# Patient Record
Sex: Female | Born: 1945
Health system: Southern US, Community
[De-identification: ages and names within clinical notes are randomized; demographics above are authoritative.]

## PROBLEM LIST (undated history)

## (undated) DIAGNOSIS — J4530 Mild persistent asthma, uncomplicated: Secondary | ICD-10-CM

## (undated) DIAGNOSIS — R001 Bradycardia, unspecified: Secondary | ICD-10-CM

## (undated) DIAGNOSIS — G4733 Obstructive sleep apnea (adult) (pediatric): Secondary | ICD-10-CM

## (undated) DIAGNOSIS — K219 Gastro-esophageal reflux disease without esophagitis: Secondary | ICD-10-CM

## (undated) DIAGNOSIS — R51 Headache: Secondary | ICD-10-CM

## (undated) DIAGNOSIS — J3089 Other allergic rhinitis: Secondary | ICD-10-CM

## (undated) DIAGNOSIS — R519 Headache, unspecified: Secondary | ICD-10-CM

## (undated) DIAGNOSIS — L500 Allergic urticaria: Secondary | ICD-10-CM

## (undated) DIAGNOSIS — E559 Vitamin D deficiency, unspecified: Secondary | ICD-10-CM

## (undated) DIAGNOSIS — E785 Hyperlipidemia, unspecified: Secondary | ICD-10-CM

## (undated) DIAGNOSIS — I10 Essential (primary) hypertension: Secondary | ICD-10-CM

## (undated) DIAGNOSIS — Z8619 Personal history of other infectious and parasitic diseases: Secondary | ICD-10-CM

## (undated) DIAGNOSIS — H9190 Unspecified hearing loss, unspecified ear: Secondary | ICD-10-CM

## (undated) DIAGNOSIS — K589 Irritable bowel syndrome without diarrhea: Secondary | ICD-10-CM

## (undated) DIAGNOSIS — R9431 Abnormal electrocardiogram [ECG] [EKG]: Secondary | ICD-10-CM

## (undated) DIAGNOSIS — T7840XA Allergy, unspecified, initial encounter: Secondary | ICD-10-CM

## (undated) DIAGNOSIS — J309 Allergic rhinitis, unspecified: Secondary | ICD-10-CM

## (undated) DIAGNOSIS — I5041 Acute combined systolic (congestive) and diastolic (congestive) heart failure: Secondary | ICD-10-CM

## (undated) DIAGNOSIS — G4730 Sleep apnea, unspecified: Secondary | ICD-10-CM

## (undated) DIAGNOSIS — H9311 Tinnitus, right ear: Secondary | ICD-10-CM

## (undated) DIAGNOSIS — K5730 Diverticulosis of large intestine without perforation or abscess without bleeding: Secondary | ICD-10-CM

## (undated) DIAGNOSIS — M5430 Sciatica, unspecified side: Secondary | ICD-10-CM

## (undated) DIAGNOSIS — J329 Chronic sinusitis, unspecified: Secondary | ICD-10-CM

## (undated) DIAGNOSIS — IMO0002 Reserved for concepts with insufficient information to code with codable children: Secondary | ICD-10-CM

## (undated) DIAGNOSIS — J302 Other seasonal allergic rhinitis: Secondary | ICD-10-CM

## (undated) DIAGNOSIS — Z0000 Encounter for general adult medical examination without abnormal findings: Secondary | ICD-10-CM

## (undated) DIAGNOSIS — R739 Hyperglycemia, unspecified: Secondary | ICD-10-CM

## (undated) DIAGNOSIS — N760 Acute vaginitis: Secondary | ICD-10-CM

## (undated) HISTORY — DX: Tinnitus, right ear: H93.11

## (undated) HISTORY — PX: CARDIAC CATHETERIZATION: SHX172

## (undated) HISTORY — DX: Obstructive sleep apnea (adult) (pediatric): G47.33

## (undated) HISTORY — DX: Sleep apnea, unspecified: G47.30

## (undated) HISTORY — DX: Irritable bowel syndrome without diarrhea: K58.9

## (undated) HISTORY — DX: Other seasonal allergic rhinitis: J30.2

## (undated) HISTORY — DX: Abnormal electrocardiogram (ECG) (EKG): R94.31

## (undated) HISTORY — DX: Allergic urticaria: L50.0

## (undated) HISTORY — DX: Bradycardia, unspecified: R00.1

## (undated) HISTORY — DX: Headache: R51

## (undated) HISTORY — DX: Encounter for general adult medical examination without abnormal findings: Z00.00

## (undated) HISTORY — DX: Vitamin D deficiency, unspecified: E55.9

## (undated) HISTORY — DX: Allergic rhinitis, unspecified: J30.9

## (undated) HISTORY — DX: Reserved for concepts with insufficient information to code with codable children: IMO0002

## (undated) HISTORY — DX: Hypocalcemia: E83.51

## (undated) HISTORY — DX: Essential (primary) hypertension: I10

## (undated) HISTORY — DX: Mild persistent asthma, uncomplicated: J45.30

## (undated) HISTORY — DX: Acute combined systolic (congestive) and diastolic (congestive) heart failure: I50.41

## (undated) HISTORY — DX: Unspecified hearing loss, unspecified ear: H91.90

## (undated) HISTORY — DX: Hyperglycemia, unspecified: R73.9

## (undated) HISTORY — DX: Acute vaginitis: N76.0

## (undated) HISTORY — DX: Other allergic rhinitis: J30.89

## (undated) HISTORY — DX: Chronic sinusitis, unspecified: J32.9

## (undated) HISTORY — PX: UMBILICAL HERNIA REPAIR: SHX196

## (undated) HISTORY — DX: Diverticulosis of large intestine without perforation or abscess without bleeding: K57.30

## (undated) HISTORY — DX: Allergy, unspecified, initial encounter: T78.40XA

## (undated) HISTORY — DX: Gastro-esophageal reflux disease without esophagitis: K21.9

## (undated) HISTORY — DX: Personal history of other infectious and parasitic diseases: Z86.19

## (undated) HISTORY — DX: Headache, unspecified: R51.9

---

## 1971-09-25 HISTORY — PX: BREAST SURGERY: SHX581

## 1984-09-24 HISTORY — PX: ABDOMINAL HYSTERECTOMY: SHX81

## 1999-12-12 ENCOUNTER — Encounter: Admission: RE | Admit: 1999-12-12 | Discharge: 1999-12-12 | Payer: Self-pay | Admitting: Obstetrics & Gynecology

## 1999-12-12 ENCOUNTER — Encounter: Payer: Self-pay | Admitting: Obstetrics & Gynecology

## 2002-01-13 ENCOUNTER — Emergency Department (HOSPITAL_COMMUNITY): Admission: EM | Admit: 2002-01-13 | Discharge: 2002-01-13 | Payer: Self-pay | Admitting: Emergency Medicine

## 2002-09-08 ENCOUNTER — Other Ambulatory Visit: Admission: RE | Admit: 2002-09-08 | Discharge: 2002-09-08 | Payer: Self-pay | Admitting: *Deleted

## 2003-12-06 ENCOUNTER — Emergency Department (HOSPITAL_COMMUNITY): Admission: AD | Admit: 2003-12-06 | Discharge: 2003-12-06 | Payer: Self-pay | Admitting: Family Medicine

## 2004-02-19 ENCOUNTER — Emergency Department (HOSPITAL_COMMUNITY): Admission: EM | Admit: 2004-02-19 | Discharge: 2004-02-19 | Payer: Self-pay | Admitting: Family Medicine

## 2004-05-08 ENCOUNTER — Ambulatory Visit (HOSPITAL_COMMUNITY): Admission: RE | Admit: 2004-05-08 | Discharge: 2004-05-08 | Payer: Self-pay | Admitting: Gastroenterology

## 2004-05-10 ENCOUNTER — Ambulatory Visit (HOSPITAL_COMMUNITY): Admission: RE | Admit: 2004-05-10 | Discharge: 2004-05-10 | Payer: Self-pay | Admitting: Gastroenterology

## 2005-01-22 ENCOUNTER — Encounter: Payer: Self-pay | Admitting: Internal Medicine

## 2005-06-14 ENCOUNTER — Emergency Department (HOSPITAL_COMMUNITY): Admission: EM | Admit: 2005-06-14 | Discharge: 2005-06-14 | Payer: Self-pay | Admitting: Emergency Medicine

## 2006-06-11 ENCOUNTER — Encounter: Payer: Self-pay | Admitting: Internal Medicine

## 2007-10-06 ENCOUNTER — Emergency Department (HOSPITAL_COMMUNITY): Admission: EM | Admit: 2007-10-06 | Discharge: 2007-10-06 | Payer: Self-pay | Admitting: Family Medicine

## 2008-07-19 ENCOUNTER — Encounter: Payer: Self-pay | Admitting: Internal Medicine

## 2008-12-10 ENCOUNTER — Encounter: Payer: Self-pay | Admitting: Internal Medicine

## 2009-08-04 LAB — HM MAMMOGRAPHY: HM Mammogram: NORMAL

## 2009-09-24 HISTORY — PX: COLONOSCOPY: SHX174

## 2009-10-21 ENCOUNTER — Ambulatory Visit (HOSPITAL_COMMUNITY): Admission: RE | Admit: 2009-10-21 | Discharge: 2009-10-21 | Payer: Self-pay | Admitting: Gastroenterology

## 2009-12-14 ENCOUNTER — Emergency Department (HOSPITAL_COMMUNITY): Admission: EM | Admit: 2009-12-14 | Discharge: 2009-12-14 | Payer: Self-pay | Admitting: Family Medicine

## 2009-12-14 ENCOUNTER — Inpatient Hospital Stay (HOSPITAL_COMMUNITY): Admission: EM | Admit: 2009-12-14 | Discharge: 2009-12-17 | Payer: Self-pay | Admitting: Emergency Medicine

## 2009-12-14 ENCOUNTER — Ambulatory Visit: Payer: Self-pay | Admitting: Cardiology

## 2009-12-14 ENCOUNTER — Encounter: Payer: Self-pay | Admitting: Internal Medicine

## 2009-12-14 LAB — CONVERTED CEMR LAB
BUN: 16 mg/dL
Chloride: 104 meq/L
Creatinine, Ser: 1 mg/dL
Eosinophils Relative: 3 %
Glucose, Bld: 88 mg/dL
HCT: 45 %
Hemoglobin: 15.3 g/dL
Lymphocytes, automated: 22 %

## 2009-12-15 ENCOUNTER — Encounter: Payer: Self-pay | Admitting: Internal Medicine

## 2009-12-15 LAB — CONVERTED CEMR LAB
Cholesterol: 191 mg/dL
Triglyceride fasting, serum: 56 mg/dL

## 2009-12-16 ENCOUNTER — Encounter (INDEPENDENT_AMBULATORY_CARE_PROVIDER_SITE_OTHER): Payer: Self-pay | Admitting: Internal Medicine

## 2009-12-30 ENCOUNTER — Encounter: Payer: Self-pay | Admitting: Internal Medicine

## 2009-12-30 LAB — CONVERTED CEMR LAB
ALT: 18 units/L
Albumin: 3.9 g/dL
Calcium: 8.8 mg/dL
Cholesterol: 177 mg/dL
Creatinine, Ser: 0.8 mg/dL
Glucose, Bld: 89 mg/dL
LDL Cholesterol: 127 mg/dL
Sodium: 136 meq/L
Total Protein: 6.9 g/dL
Triglyceride fasting, serum: 55 mg/dL

## 2010-01-02 ENCOUNTER — Encounter: Payer: Self-pay | Admitting: Internal Medicine

## 2010-08-25 ENCOUNTER — Encounter: Payer: Self-pay | Admitting: Internal Medicine

## 2010-08-28 ENCOUNTER — Ambulatory Visit: Payer: Self-pay | Admitting: Internal Medicine

## 2010-08-28 DIAGNOSIS — I10 Essential (primary) hypertension: Secondary | ICD-10-CM

## 2010-08-28 DIAGNOSIS — J302 Other seasonal allergic rhinitis: Secondary | ICD-10-CM

## 2010-08-28 DIAGNOSIS — G4733 Obstructive sleep apnea (adult) (pediatric): Secondary | ICD-10-CM | POA: Insufficient documentation

## 2010-08-28 DIAGNOSIS — K5730 Diverticulosis of large intestine without perforation or abscess without bleeding: Secondary | ICD-10-CM | POA: Insufficient documentation

## 2010-08-28 DIAGNOSIS — F32A Depression, unspecified: Secondary | ICD-10-CM | POA: Insufficient documentation

## 2010-08-28 DIAGNOSIS — F329 Major depressive disorder, single episode, unspecified: Secondary | ICD-10-CM

## 2010-08-28 DIAGNOSIS — J3089 Other allergic rhinitis: Secondary | ICD-10-CM

## 2010-08-28 HISTORY — DX: Obstructive sleep apnea (adult) (pediatric): G47.33

## 2010-08-28 HISTORY — DX: Other seasonal allergic rhinitis: J30.2

## 2010-08-28 HISTORY — DX: Essential (primary) hypertension: I10

## 2010-08-28 HISTORY — DX: Other allergic rhinitis: J30.89

## 2010-08-29 LAB — CONVERTED CEMR LAB
ALT: 16 units/L (ref 0–35)
AST: 20 units/L (ref 0–37)
Albumin: 4 g/dL (ref 3.5–5.2)
Alkaline Phosphatase: 72 units/L (ref 39–117)
Basophils Relative: 1 % (ref 0.0–3.0)
CO2: 31 meq/L (ref 19–32)
Chloride: 98 meq/L (ref 96–112)
Creatinine, Ser: 0.9 mg/dL (ref 0.4–1.2)
Eosinophils Relative: 5.5 % — ABNORMAL HIGH (ref 0.0–5.0)
Folate: 5 ng/mL
Glucose, Bld: 94 mg/dL (ref 70–99)
Lymphocytes Relative: 20.3 % (ref 12.0–46.0)
MCHC: 34.5 g/dL (ref 30.0–36.0)
MCV: 90.6 fL (ref 78.0–100.0)
Monocytes Relative: 10.6 % (ref 3.0–12.0)
Neutrophils Relative %: 62.6 % (ref 43.0–77.0)
Platelets: 242 10*3/uL (ref 150.0–400.0)
Potassium: 3.9 meq/L (ref 3.5–5.1)
RDW: 13.7 % (ref 11.5–14.6)
Sodium: 137 meq/L (ref 135–145)
TSH: 1.8 microintl units/mL (ref 0.35–5.50)
Total Protein: 7.3 g/dL (ref 6.0–8.3)
Vitamin B-12: 514 pg/mL (ref 211–911)

## 2010-08-31 ENCOUNTER — Encounter: Payer: Self-pay | Admitting: Internal Medicine

## 2010-10-06 ENCOUNTER — Ambulatory Visit
Admission: RE | Admit: 2010-10-06 | Discharge: 2010-10-06 | Payer: Self-pay | Source: Home / Self Care | Attending: Internal Medicine | Admitting: Internal Medicine

## 2010-10-26 NOTE — Letter (Signed)
Summary: [facility] ([facility]  [facility]   Imported By: Sherian Rein 09/29/2010 14:59:45  _____________________________________________________________________  External Attachment:    Type:   Image     Comment:   External Document

## 2010-10-26 NOTE — Assessment & Plan Note (Signed)
Summary: ?reaction to bp meds/cd   Vital Signs:  Patient profile:   65 year old female Height:      60 inches (152.40 cm) Weight:      217.0 pounds (98.64 kg) O2 Sat:      96 % on Room air Temp:     98.3 degrees F (36.83 degrees C) oral Pulse rate:   81 / minute BP sitting:   148 / 78  (left arm) Cuff size:   large  Vitals Entered By: Orlan Leavens RMA (October 06, 2010 3:05 PM)  O2 Flow:  Room air CC: ? reaction to BP med Is Patient Diabetic? No Pain Assessment Patient in pain? no        Primary Care Provider:  Newt Lukes MD  CC:  ? reaction to BP med.  History of Present Illness: here due to conern about BP med -  ?adv rxn -hives, itch and mild lip or tounge swelling after med use no symptoms when med is not taken symptoms mild and present for years but seem worse in past 3 weeks than before no other diet or med changes  reviewed other med issues: 1) HTN - see above -   2) allg rhinitis - reports compliance with otc medical treatment as needed and no rx'd changes in medication dose or frequency. denies adverse side effects related to current therapy. prev on rx meds but declines nose spray use ("it doen't bother me that much")  3) OSA - reports compliance with ongoing medical treatment (CPAP qhs); no changes in mask, pressure or settings. denies adverse side effects related to current tx and no f/u with pulm since starting cpap -   Clinical Review Panels:  CBC   WBC:  9.4 (08/28/2010)   RBC:  4.54 (08/28/2010)   Hgb:  14.2 (08/28/2010)   Hct:  41.1 (08/28/2010)   Platelets:  242.0 (08/28/2010)   MCV  90.6 (08/28/2010)   MCHC  34.5 (08/28/2010)   RDW  13.7 (08/28/2010)   PMN:  62.6 (08/28/2010)   Lymphs:  20.3 (08/28/2010)   Monos:  10.6 (08/28/2010)   Eosinophils:  5.5 (08/28/2010)   Basophil:  1.0 (08/28/2010)  Complete Metabolic Panel   Glucose:  94 (08/28/2010)   Sodium:  137 (08/28/2010)   Potassium:  3.9 (08/28/2010)   Chloride:  98  (08/28/2010)   CO2:  31 (08/28/2010)   BUN:  17 (08/28/2010)   Creatinine:  0.9 (08/28/2010)   Albumin:  4.0 (08/28/2010)   Total Protein:  7.3 (08/28/2010)   Calcium:  9.1 (08/28/2010)   Total Bili:  0.7 (08/28/2010)   Alk Phos:  72 (08/28/2010)   SGPT (ALT):  16 (08/28/2010)   SGOT (AST):  20 (08/28/2010)   Current Medications (verified): 1)  Lisinopril-Hydrochlorothiazide 20-25 Mg Tabs (Lisinopril-Hydrochlorothiazide) .... Take 1 By Mouth Once Daily 2)  Proventil Hfa 108 (90 Base) Mcg/act Aers (Albuterol Sulfate) .... Take 1 Puff Two Times A Day As Needed 3)  Fish Oil 1000 Mg Caps (Omega-3 Fatty Acids) .... Take 1 By Mouth Once Daily 4)  Multivitamins  Tabs (Multiple Vitamin) .... Take 1 By Mouth Once Daily 5)  Vitamin D 2000 Unit Tabs (Cholecalciferol) .... Take 1 By Mouth Once Daily  Allergies (verified): 1)  ! Lisinopril-Hydrochlorothiazide (Lisinopril-Hydrochlorothiazide)  Past History:  Past Medical History: Allergic rhinitis Hypertension  OSA - CPAP at bedtime - dx 12/2008  IBS Diverticulosis, colon  MD roster: GI - mann pulm - ortho - jeff beane  Review of  Systems  The patient denies weight gain, chest pain, peripheral edema, and headaches.    Physical Exam  General:  alert, well-developed, well-nourished, and cooperative to examination.   overweight-appearing.   Head:  Normocephalic and atraumatic without obvious abnormalities. No apparent alopecia or balding. Eyes:  vision grossly intact; pupils equal, round and reactive to light.  conjunctiva and lids normal.    Ears:  normal pinnae bilaterally, without erythema, swelling, or tenderness to palpation. TMs clear, without effusion, or cerumen impaction. Hearing grossly normal bilaterally  Mouth:  teeth and gums in good repair; mucous membranes moist, without lesions or ulcers. oropharynx clear without exudate, no erythema.  Lungs:  normal respiratory effort, no intercostal retractions or use of accessory  muscles; normal breath sounds bilaterally - no crackles and no wheezes.    Heart:  normal rate, regular rhythm, no murmur, and no rub. BLE without edema.    Impression & Recommendations:  Problem # 1:  HYPERTENSION (ICD-401.9)  adv rxn to ACEI and or HCTZ - stop both for now prev on metoprolol, but ongoing fatigue (see last OV) start arb - ex done - recheck bp 2-4 weeks before refill on med for titration or adjustment as needed  pt understands and agrees Her updated medication list for this problem includes:    Losartan Potassium 100 Mg Tabs (Losartan potassium) .Marland Kitchen... 1 by mouth once daily  Orders: Prescription Created Electronically [phone])  Complete Medication List: 1)  Losartan Potassium 100 Mg Tabs (Losartan potassium) .Marland Kitchen.. 1 by mouth once daily 2)  Proventil Hfa 108 (90 Base) Mcg/act Aers (Albuterol sulfate) .... Take 1 puff two times a day as needed 3)  Fish Oil 1000 Mg Caps (Omega-3 fatty acids) .... Take 1 by mouth once daily 4)  Multivitamins Tabs (Multiple vitamin) .... Take 1 by mouth once daily 5)  Vitamin D 2000 Unit Tabs (Cholecalciferol) .... Take 1 by mouth once daily  Patient Instructions: 1)  it was good to see you today. 2)  stop lisinopril hctz 3)  start losartan once daily for blood pressure - your prescription has been electronically submitted to your pharmacy. Please take as directed. Contact our office if you believe you're having problems with the medication(s).  4)  Please schedule a follow-up appointment in 2-4 to recheck blood pressure and discuss medication, call sooner if problems.  Prescriptions: LOSARTAN POTASSIUM 100 MG TABS (LOSARTAN POTASSIUM) 1 by mouth once daily  #30 x 3   Entered and Authorized by:   Newt Lukes MD   Signed by:   Newt Lukes MD on 10/06/2010   Method used:   Electronically to        [facility]* (retail)       [address]       [address] Shipping/mailing       [place], [place]   [address]       Ph: [phone]       Fax: [phone]   RxID:   [id number]    Orders Added: 1)  Est. Patient Level IV [44010] 2)  Prescription Created Electronically [id number]

## 2010-10-26 NOTE — Assessment & Plan Note (Signed)
Summary: NEW/ UMR /NWS   Vital Signs:  Patient profile:   65 year old female Height:      60 inches (152.40 cm) Weight:      218.8 pounds (99.45 kg) BMI:     42.89 O2 Sat:      97 % on Room air Temp:     98.5 degrees F (36.94 degrees C) oral Pulse rate:   55 / minute BP sitting:   130 / 72  (left arm) Cuff size:   large  Vitals Entered By: Orlan Leavens RMA (August 28, 2010 9:42 AM)  O2 Flow:  Room air CC: New patient Is Patient Diabetic? No Pain Assessment Patient in pain? no        Primary Care Navah Grondin:  Newt Lukes MD  CC:  New patient.  History of Present Illness: new pt to me and our practice, here to est care [facility]  (dr Zoe Lan)  c/o fatigue symptoms  symptoms chronic >10years but worse in past 6 months no unintentional weight loss (down 4# with diet changes and inc exercise) no joint pain or swelling no fever, no travel - no SOB, cough or CP - no Ha or abd pain - deneis symptoms of depression, no hx depression no medication changes -no sleep problems - just "tired" physically each AM, not motivational fatigue  1) HTN - reports compliance with ongoing medical treatment and no changes in medication dose or frequency. denies adverse side effects related to current therapy.   2) allg rhinitis - reports compliance with ongoing medical treatment and no changes in medication dose or frequency. denies adverse side effects related to current therapy.   3) OSA - reports compliance with ongoing medical treatment (CPAP qhs); no changes in mask, pressure or settings. denies adverse side effects related to current tx and no f/u with pulm since starting cpap -   Preventive Screening-Counseling & Management  Alcohol-Tobacco     Alcohol drinks/day: 0     Alcohol Counseling: not indicated; patient does not drink     Smoking Status: never     Tobacco Counseling: not indicated; no tobacco use  Caffeine-Diet-Exercise     Does Patient  Exercise: no     Exercise Counseling: to improve exercise regimen     Depression Counseling: not indicated; screening negative for depression  Safety-Violence-Falls     Seat Belt Counseling: not indicated; patient wears seat belts     Helmet Counseling: not indicated; patient wears helmet when riding bicycle/motocycle     Firearms in the Home: no firearms in the home     Violence Counseling: not indicated; no violence risk noted  Clinical Review Panels:  Prevention   Last Mammogram:  Interpretation No specific mammographic evidence of malignancy.   (07/25/2009)  Lipid Management   Cholesterol:  191 (12/15/2009)   LDL (bad choesterol):  148 (12/15/2009)   HDL (good cholesterol):  32 (12/15/2009)   Triglycerides:  56 (12/15/2009)  Diabetes Management   HgBA1C:  5.5 (12/15/2009)   Creatinine:  1.0 (12/14/2009)  CBC   WBC:  9.6 (12/14/2009)   RBC:  4.79 (12/14/2009)   Hgb:  15.3 (12/14/2009)   Hct:  45.0 (12/14/2009)   Platelets:  222 (12/14/2009)   MCV  91.4 (12/14/2009)   RDW  13.7 (12/14/2009)   PMN:  68 (12/14/2009)   Monos:  7 (12/14/2009)   Eosinophils:  3 (12/14/2009)   Basophil:  1 (12/14/2009)  Complete Metabolic Panel  Glucose:  88 (12/14/2009)   Sodium:  140 (12/14/2009)   Potassium:  4.0 (12/14/2009)   Chloride:  104 (12/14/2009)   BUN:  16 (12/14/2009)   Creatinine:  1.0 (12/14/2009)   Current Medications (verified): 1)  Lisinopril-Hydrochlorothiazide 20-25 Mg Tabs (Lisinopril-Hydrochlorothiazide) .... Take 1 By Mouth Once Daily 2)  Proventil Hfa 108 (90 Base) Mcg/act Aers (Albuterol Sulfate) .... Take 1 Puff Two Times A Day As Needed 3)  Fish Oil 1000 Mg Caps (Omega-3 Fatty Acids) .... Take 1 By Mouth Once Daily 4)  Multivitamins  Tabs (Multiple Vitamin) .... Take 1 By Mouth Once Daily 5)  Vitamin D 2000 Unit Tabs (Cholecalciferol) .... Take 1 By Mouth Once Daily  Allergies (verified): No Known Drug Allergies  Past History:  Past Medical  History: Allergic rhinitis Hypertension  OSA - CPAP at bedtime - dx 12/2008 IBS Diverticulosis, colon  MD roster: GI - mann pulm - ortho - jeff beane  Past Surgical History: Hysterectomy (1986) Breast biospy (1973)  Family History: Reviewed history and no changes required. 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 I have reviewed all other systems and they were negative.   Physical Exam  General:  alert, well-developed, well-nourished, and cooperative to examination.   overweight-appearing.   Head:  Normocephalic and atraumatic without obvious abnormalities. No apparent alopecia or balding. Eyes:  vision grossly intact; pupils equal, round and reactive to light.  conjunctiva and lids normal.    Ears:  normal pinnae bilaterally, without erythema, swelling, or tenderness to palpation. TMs clear, without effusion, or cerumen impaction. Hearing grossly normal bilaterally  Mouth:  teeth and gums in good repair; mucous membranes moist, without lesions or ulcers. oropharynx clear without exudate, no erythema.  Neck:  supple, full ROM, no masses, no thyromegaly; no thyroid nodules or tenderness. no JVD or carotid bruits.   Lungs:  normal respiratory effort, no intercostal retractions or use of accessory muscles; normal breath sounds bilaterally - no crackles and no wheezes.    Heart:  normal rate, regular rhythm, no murmur, and no rub. BLE without edema.  Abdomen:  soft, non-tender, normal bowel sounds, no distention; no masses and no appreciable hepatomegaly or splenomegaly.   Genitalia:  defer Msk:  No deformity or scoliosis noted of thoracic or  lumbar spine.   Neurologic:  alert & oriented X3 and cranial nerves II-XII symetrically intact.  strength normal in all extremities, sensation intact to light touch, and gait normal. speech fluent without dysarthria or aphasia; follows commands with good comprehension.  Skin:  no rashes, vesicles, ulcers, or erythema. No nodules or irregularity to palpation.  Psych:  Oriented X3, memory intact for recent and remote, normally interactive, good eye contact, not anxious appearing, not depressed appearing, and not agitated.      Impression & Recommendations:  Problem # 1:  FATIGUE (ICD-780.79) exam and hx benign -  check labs now - consider pulm f/u to review cpap settings Orders: TLB-B12 + Folate Pnl (16109_60454-U98/JXB) TLB-BMP (Basic Metabolic Panel-BMET) (80048-METABOL) TLB-CBC Platelet - w/Differential (85025-CBCD) TLB-Hepatic/Liver Function Pnl (80076-HEPATIC) TLB-TSH (Thyroid Stimulating Hormone) (84443-TSH)  Problem # 2:  OBSTRUCTIVE SLEEP APNEA (ICD-327.23) reviewd efforts at weight  loss with diet and exercise changes - send for records from prior PCP to review and consider pulm f/u  Problem # 3:  HYPERTENSION (ICD-401.9)  Her updated medication list for this problem includes:    Lisinopril-hydrochlorothiazide 20-25 Mg Tabs (Lisinopril-hydrochlorothiazide) .Marland Kitchen... Take 1 by mouth once daily  BP today: 130/72  Labs Reviewed: K+: 4.0 (12/14/2009) Creat: : 1.0 (12/14/2009)   Chol: 191 (12/15/2009)   HDL: 32 (12/15/2009)   LDL: 148 (12/15/2009)   TG: 56 (12/15/2009)  Problem # 4:  ALLERGIC RHINITIS (ICD-477.9)  Discussed use of allergy medications and environmental measures.   Complete Medication List: 1)  Lisinopril-hydrochlorothiazide 20-25 Mg Tabs (Lisinopril-hydrochlorothiazide) .... Take 1 by mouth once daily 2)  Proventil Hfa 108 (90 Base) Mcg/act Aers (Albuterol sulfate) .... Take 1 puff two times a day as needed 3)  Fish Oil 1000 Mg Caps (Omega-3 fatty acids) .... Take  1 by mouth once daily 4)  Multivitamins Tabs (Multiple vitamin) .... Take 1 by mouth once daily 5)  Vitamin D 2000 Unit Tabs (Cholecalciferol) .... Take 1 by mouth once daily  Patient Instructions: 1)  it was good to see you today. 2)  medical history reviewed - no medicine changes today 3)  test(s) ordered today - your results will be posted on the phone tree for review in 48-72 hours from the time of test completion; call [phone] and enter your 9 digit MRN (listed above on this page, just below your name); if any changes need to be made or there are abnormal results, you will be contacted directly. 4)  will send for records from dr. Ronelle Nigh to review 5)  Please schedule a follow-up appointment in 3-6 months to follow hypertension, sooner if problems.    Orders Added: 1)  TLB-B12 + Folate Pnl [[id number]-B12/FOL] 2)  TLB-BMP (Basic Metabolic Panel-BMET) [80048-METABOL] 3)  TLB-CBC Platelet - w/Differential [85025-CBCD] 4)  TLB-Hepatic/Liver Function Pnl [80076-HEPATIC] 5)  TLB-TSH (Thyroid Stimulating Hormone) [84443-TSH] 6)  New Patient Level III [56213]     Mammogram  Procedure date:  07/25/2009  Findings:      Interpretation No specific mammographic evidence of malignancy.

## 2010-11-10 ENCOUNTER — Ambulatory Visit (INDEPENDENT_AMBULATORY_CARE_PROVIDER_SITE_OTHER): Payer: 59 | Admitting: Internal Medicine

## 2010-11-10 ENCOUNTER — Encounter: Payer: Self-pay | Admitting: Internal Medicine

## 2010-11-10 DIAGNOSIS — K219 Gastro-esophageal reflux disease without esophagitis: Secondary | ICD-10-CM

## 2010-11-10 DIAGNOSIS — I10 Essential (primary) hypertension: Secondary | ICD-10-CM

## 2010-11-15 NOTE — Assessment & Plan Note (Signed)
Summary: eval blood pressure   Vital Signs:  Patient profile:   65 year old female Height:      60 inches (152.40 cm) Weight:      218 pounds (99.09 kg) BMI:     42.73 O2 Sat:      96 % on Room air Temp:     99.0 degrees F (37.22 degrees C) oral Pulse rate:   57 / minute BP sitting:   126 / 74  (left arm) Cuff size:   large  Vitals Entered By: Brenton Grills CMA Duncan Dull) (November 10, 2010 2:57 PM)  O2 Flow:  Room air CC: Follow up on BP/aj Is Patient Diabetic? No   Primary Care Provider:  Newt Lukes MD  CC:  Follow up on BP/aj.  History of Present Illness: here f/u   1) HTN -  changed lisinopril to losartan 10/06/10 (ACEI adv rxn -hives, itch and mild lip or tounge swelling after lisinopril use) - reports compliance with ongoing medical treatment and no changes in medication dose or frequency. denies adverse side effects related to current therapy.   2) allg rhinitis - reports compliance with otc medical treatment as needed and no rx'd changes in medication dose or frequency. denies adverse side effects related to current therapy. prev on rx meds but declines nose spray use ("it doen't bother me that much")  3) OSA - reports compliance with ongoing medical treatment (CPAP qhs); no changes in mask, pressure or settings. denies adverse side effects related to current tx and no f/u with pulm since starting cpap -   4)GERD - prev on PPI since 11/2009 hosp for CP - cardaic eval neg (myoview w/o ischemia) and started on PPI - CP improved - takes as needed and requests refill on same  Clinical Review Panels:  Lipid Management   Cholesterol:  177 (12/30/2009)   LDL (bad choesterol):  127 (12/30/2009)   HDL (good cholesterol):  39 (12/30/2009)   Triglycerides:  55 (12/30/2009)  CBC   WBC:  9.4 (08/28/2010)   RBC:  4.54 (08/28/2010)   Hgb:  14.2 (08/28/2010)   Hct:  41.1 (08/28/2010)   Platelets:  242.0 (08/28/2010)   MCV  90.6 (08/28/2010)   MCHC  34.5 (08/28/2010)  RDW  13.7 (08/28/2010)   PMN:  62.6 (08/28/2010)   Lymphs:  20.3 (08/28/2010)   Monos:  10.6 (08/28/2010)   Eosinophils:  5.5 (08/28/2010)   Basophil:  1.0 (08/28/2010)  Complete Metabolic Panel   Glucose:  94 (08/28/2010)   Sodium:  137 (08/28/2010)   Potassium:  3.9 (08/28/2010)   Chloride:  98 (08/28/2010)   CO2:  31 (08/28/2010)   BUN:  17 (08/28/2010)   Creatinine:  0.9 (08/28/2010)   Albumin:  4.0 (08/28/2010)   Total Protein:  7.3 (08/28/2010)   Calcium:  9.1 (08/28/2010)   Total Bili:  0.7 (08/28/2010)   Alk Phos:  72 (08/28/2010)   SGPT (ALT):  16 (08/28/2010)   SGOT (AST):  20 (08/28/2010)   Current Medications (verified): 1)  Losartan Potassium 100 Mg Tabs (Losartan Potassium) .Marland Kitchen.. 1 By Mouth Once Daily 2)  Proventil Hfa 108 (90 Base) Mcg/act Aers (Albuterol Sulfate) .... Take 1 Puff Two Times A Day As Needed 3)  Fish Oil 1000 Mg Caps (Omega-3 Fatty Acids) .... Take 1 By Mouth Once Daily 4)  Multivitamins  Tabs (Multiple Vitamin) .... Take 1 By Mouth Once Daily 5)  Vitamin D 2000 Unit Tabs (Cholecalciferol) .... Take 1 By Mouth Once Daily  Allergies (verified): 1)  ! Lisinopril-Hydrochlorothiazide (Lisinopril-Hydrochlorothiazide)  Past History:  Past Medical History: Allergic rhinitis Hypertension  OSA - CPAP at bedtime - dx 12/2008  IBS Diverticulosis, colon GERD  MD roster: GI - mann pulm - ortho - jeff beane  Past Surgical History: Hysterectomy (1986)  Breast biospy (1973)  Review of Systems  The patient denies weight loss, hoarseness, syncope, peripheral edema, headaches, melena, and hematochezia.    Physical Exam  General:  alert, well-developed, well-nourished, and cooperative to examination.   overweight-appearing.   Lungs:  normal respiratory effort, no intercostal retractions or use of accessory muscles; normal breath sounds bilaterally - no crackles and no wheezes.    Heart:  normal rate, regular rhythm, no murmur, and no rub. BLE  without edema.    Impression & Recommendations:  Problem # 1:  HYPERTENSION (ICD-401.9)  Her updated medication list for this problem includes:    Losartan Potassium 100 Mg Tabs (Losartan potassium) .Marland Kitchen... 1 by mouth once daily  adv rxn to ACEI and or HCTZ - stopped both and started losartan 09/2010 - doing well - cont same prev on metoprolol, but ongoing fatigue (see last OV)  BP today: 126/74 Prior BP: 148/78 (10/06/2010)  Labs Reviewed: K+: 3.9 (08/28/2010) Creat: : 0.9 (08/28/2010)   Chol: 177 (12/30/2009)   HDL: 39 (12/30/2009)   LDL: 127 (12/30/2009)   TG: 55 (12/30/2009)  Orders: Prescription Created Electronically [phone])  Problem # 2:  GERD (ICD-530.81)  Her updated medication list for this problem includes:    Omeprazole 20 Mg Cpdr (Omeprazole) .Marland Kitchen... 1 by mouth two times a day  Labs Reviewed: Hgb: 14.2 (08/28/2010)   Hct: 41.1 (08/28/2010)  Orders: Prescription Created Electronically [phone])  Complete Medication List: 1)  Losartan Potassium 100 Mg Tabs (Losartan potassium) .Marland Kitchen.. 1 by mouth once daily 2)  Proventil Hfa 108 (90 Base) Mcg/act Aers (Albuterol sulfate) .... Take 1 puff two times a day as needed 3)  Fish Oil 1000 Mg Caps (Omega-3 fatty acids) .... Take 1 by mouth once daily 4)  Multivitamins Tabs (Multiple vitamin) .... Take 1 by mouth once daily 5)  Vitamin D 2000 Unit Tabs (Cholecalciferol) .... Take 1 by mouth once daily 6)  Omeprazole 20 Mg Cpdr (Omeprazole) .Marland Kitchen.. 1 by mouth two times a day  Patient Instructions: 1)  it was good to see you today. 2)  continue losartan once daily for blood pressure -  3)  start omeprazole for reflux symptoms - 4)  your prescription has been electronically submitted to your pharmacy. Please take as directed. Contact our office if you believe you're having problems with the medication(s).  5)  Please schedule a follow-up appointment in 6 months to recheck blood pressure and medications, call sooner if problems.    Prescriptions: LOSARTAN POTASSIUM 100 MG TABS (LOSARTAN POTASSIUM) 1 by mouth once daily  #30 x 11   Entered and Authorized by:   Newt Lukes MD   Signed by:   Newt Lukes MD on 11/10/2010   Method used:   Electronically to        [facility]* (retail)       [address]       [address] Shipping/mailing       [place], [place]  [address]       Ph: [phone]       Fax: [phone]   RxID:   [id number] OMEPRAZOLE 20 MG CPDR (OMEPRAZOLE) 1 by mouth two times a  day  #60 x 3   Entered and Authorized by:   Newt Lukes MD   Signed by:   Newt Lukes MD on 11/10/2010   Method used:   Electronically to        [facility]* (retail)       [address]       [address] Shipping/mailing       [place], [place]  [address]       Ph: [phone]       Fax: [phone]   RxID:   [id number]    Orders Added: 1)  Est. Patient Level IV [66440] 2)  Prescription Created Electronically [phone]

## 2010-12-17 LAB — CBC
HCT: 41.3 % (ref 36.0–46.0)
HCT: 43.8 % (ref 36.0–46.0)
Hemoglobin: 13.8 g/dL (ref 12.0–15.0)
Hemoglobin: 14.5 g/dL (ref 12.0–15.0)
MCHC: 33 g/dL (ref 30.0–36.0)
MCHC: 33.5 g/dL (ref 30.0–36.0)
MCV: 90.8 fL (ref 78.0–100.0)
MCV: 91.4 fL (ref 78.0–100.0)
Platelets: 222 10*3/uL (ref 150–400)
Platelets: 256 10*3/uL (ref 150–400)
RBC: 4.55 MIL/uL (ref 3.87–5.11)
RBC: 4.79 MIL/uL (ref 3.87–5.11)
RDW: 13.5 % (ref 11.5–15.5)
RDW: 13.7 % (ref 11.5–15.5)
WBC: 10.6 10*3/uL — ABNORMAL HIGH (ref 4.0–10.5)
WBC: 9.6 10*3/uL (ref 4.0–10.5)

## 2010-12-17 LAB — CARDIAC PANEL(CRET KIN+CKTOT+MB+TROPI)
CK, MB: 0.7 ng/mL (ref 0.3–4.0)
CK, MB: 0.8 ng/mL (ref 0.3–4.0)
Relative Index: 0.5 (ref 0.0–2.5)
Relative Index: 0.7 (ref 0.0–2.5)
Total CK: 116 U/L (ref 7–177)
Total CK: 129 U/L (ref 7–177)
Troponin I: <0.01 ng/mL (ref 0.00–0.06)
Troponin I: <0.01 ng/mL (ref 0.00–0.06)

## 2010-12-17 LAB — POCT CARDIAC MARKERS
CKMB, poc: <1 ng/mL — ABNORMAL LOW (ref 1.0–8.0)
Myoglobin, poc: 75.9 ng/mL (ref 12–200)
Troponin i, poc: <0.05 ng/mL (ref 0.00–0.09)

## 2010-12-17 LAB — LIPID PANEL
Cholesterol: 191 mg/dL (ref 0–200)
HDL: 32 mg/dL — ABNORMAL LOW (ref >39)
LDL Cholesterol: 148 mg/dL — ABNORMAL HIGH (ref 0–99)
Total CHOL/HDL Ratio: 6 RATIO
Triglycerides: 56 mg/dL (ref <150)
VLDL: 11 mg/dL (ref 0–40)

## 2010-12-17 LAB — DIFFERENTIAL
Basophils Absolute: 0.1 10*3/uL (ref 0.0–0.1)
Basophils Absolute: 0.1 10*3/uL (ref 0.0–0.1)
Basophils Relative: 1 % (ref 0–1)
Basophils Relative: 1 % (ref 0–1)
Eosinophils Absolute: 0.3 10*3/uL (ref 0.0–0.7)
Eosinophils Absolute: 0.4 10*3/uL (ref 0.0–0.7)
Eosinophils Relative: 3 % (ref 0–5)
Eosinophils Relative: 4 % (ref 0–5)
Lymphocytes Relative: 19 % (ref 12–46)
Lymphocytes Relative: 22 % (ref 12–46)
Lymphs Abs: 2 10*3/uL (ref 0.7–4.0)
Lymphs Abs: 2.1 10*3/uL (ref 0.7–4.0)
Monocytes Absolute: 0.7 10*3/uL (ref 0.1–1.0)
Monocytes Absolute: 0.8 10*3/uL (ref 0.1–1.0)
Monocytes Relative: 7 % (ref 3–12)
Monocytes Relative: 8 % (ref 3–12)
Neutro Abs: 6.5 10*3/uL (ref 1.7–7.7)
Neutro Abs: 7.3 10*3/uL (ref 1.7–7.7)
Neutrophils Relative %: 68 % (ref 43–77)
Neutrophils Relative %: 68 % (ref 43–77)

## 2010-12-17 LAB — BRAIN NATRIURETIC PEPTIDE: Pro B Natriuretic peptide (BNP): 38 pg/mL (ref 0.0–100.0)

## 2010-12-17 LAB — HEMOGLOBIN A1C
Hgb A1c MFr Bld: 5.5 % (ref 4.6–6.1)
Mean Plasma Glucose: 111 mg/dL

## 2010-12-17 LAB — POCT I-STAT, CHEM 8
BUN: 16 mg/dL (ref 6–23)
Calcium, Ion: 1.11 mmol/L — ABNORMAL LOW (ref 1.12–1.32)
Chloride: 104 mEq/L (ref 96–112)
Creatinine, Ser: 1 mg/dL (ref 0.4–1.2)
Glucose, Bld: 88 mg/dL (ref 70–99)
HCT: 45 % (ref 36.0–46.0)
Hemoglobin: 15.3 g/dL — ABNORMAL HIGH (ref 12.0–15.0)
Potassium: 4 mEq/L (ref 3.5–5.1)
Sodium: 140 mEq/L (ref 135–145)
TCO2: 31 mmol/L (ref 0–100)

## 2010-12-17 LAB — CK TOTAL AND CKMB (NOT AT ARMC)
CK, MB: 1 ng/mL (ref 0.3–4.0)
Relative Index: 0.7 (ref 0.0–2.5)
Total CK: 139 U/L (ref 7–177)

## 2010-12-17 LAB — TROPONIN I: Troponin I: <0.01 ng/mL (ref 0.00–0.06)

## 2011-02-09 NOTE — Op Note (Signed)
NAME:  Anne Barnes, Anne Barnes                            ACCOUNT NO.:  [id number]   MEDICAL RECORD NO.:  [id number]                   PATIENT TYPE:  AMB   LOCATION:  ENDO                                 FACILITY:  [facility]   PHYSICIAN:  Anselmo Rod, M.D.               DATE OF BIRTH:  1945-11-11   DATE OF PROCEDURE:  05/08/2004  DATE OF DISCHARGE:                                 OPERATIVE REPORT   PROCEDURE PERFORMED:  Screening colonoscopy.   ENDOSCOPIST:  Anselmo Rod, M.D.   INSTRUMENT USED:  Olympus video colonoscope.   INDICATIONS FOR PROCEDURE:  A 65 year old [place] American female who  underwent screening colonoscopy for change in bowel habits to rule out  chronic polyps, masses, etc.   PREPROCEDURE PREPARATION:  Informed consent was procured from the patient.  The patient fasted for eight hours prior to the procedure and prepped with a  bottle of magnesium citrate and a gallop of GoLYTELY the night prior to the  procedure.   PREPROCEDURE PHYSICAL:  VITAL SIGNS:  Stable vital signs.  NECK:  Supple.  CHEST:  Clear to auscultation.  CARDIOVASCULAR:  S1 and S2 regular.  ABDOMEN:  Soft with normal bowel sounds.   DESCRIPTION OF PROCEDURE:  The patient was placed in left lateral decubitus  position, sedated with 100 mg of Demerol and 10 mg of Versed in slow  incremental doses.  Once the patient was adequately sedated and maintained  on low flow oxygen and continuous cardiac monitoring, the Olympus video  colonoscope was advanced from the rectum to the proximal right colon with  difficulty.  There was a large amount of residual stool in the colon.  Multiple washings were done.  The patient's position was changed from the  left lateral decubitus position to the supine position and the right lateral  position with gentle application of abdominal pressure to reach the cecal  base.  In spite of several efforts to do so and repositioning the scope, the  procedure could not be  completed.  There was a large amount of residual  stool in the right colon and in the dependent areas of the colon.  No  masses, polyps or diverticula were noted.  Retroflexion in the rectum  revealed no abnormalities.  Small lesions could have been missed.   IMPRESSION:  Incomplete colonoscopy of the proximal right colon.  Large  amount of residual stool in the colon.  Multiple washings done, the  patient's position turned several times.   RECOMMENDATIONS:  1. An air contrast barium enema will be done as the cecal base was not     visualized.  The ileocecal valve was noted and appeared healthy.  2. Outpatient follow-up after the barium enema has been done.  Anselmo Rod, M.D.    JNM/MEDQ  D:  05/08/2004  T:  05/08/2004  Job:  [id number]   cc:   Duwayne Heck L. Mahaffey, M.D.  [address]  [place]  [place] [address]  Fax: [phone]

## 2011-05-10 ENCOUNTER — Encounter: Payer: Self-pay | Admitting: Internal Medicine

## 2011-07-13 ENCOUNTER — Emergency Department (HOSPITAL_COMMUNITY)
Admission: EM | Admit: 2011-07-13 | Discharge: 2011-07-13 | Disposition: A | Discharge status: Home / Self Care | Payer: Self-pay | Attending: Emergency Medicine | Admitting: Emergency Medicine

## 2011-07-13 DIAGNOSIS — M25559 Pain in unspecified hip: Secondary | ICD-10-CM | POA: Insufficient documentation

## 2011-07-13 DIAGNOSIS — I10 Essential (primary) hypertension: Secondary | ICD-10-CM | POA: Insufficient documentation

## 2011-07-17 ENCOUNTER — Emergency Department (INDEPENDENT_AMBULATORY_CARE_PROVIDER_SITE_OTHER): Payer: Self-pay

## 2011-07-17 ENCOUNTER — Encounter (HOSPITAL_BASED_OUTPATIENT_CLINIC_OR_DEPARTMENT_OTHER): Payer: Self-pay | Admitting: *Deleted

## 2011-07-17 ENCOUNTER — Emergency Department (HOSPITAL_BASED_OUTPATIENT_CLINIC_OR_DEPARTMENT_OTHER)
Admission: EM | Admit: 2011-07-17 | Discharge: 2011-07-17 | Disposition: A | Discharge status: Home / Self Care | Payer: Self-pay | Attending: Emergency Medicine | Admitting: Emergency Medicine

## 2011-07-17 DIAGNOSIS — G4733 Obstructive sleep apnea (adult) (pediatric): Secondary | ICD-10-CM | POA: Insufficient documentation

## 2011-07-17 DIAGNOSIS — M5430 Sciatica, unspecified side: Secondary | ICD-10-CM | POA: Insufficient documentation

## 2011-07-17 DIAGNOSIS — M25559 Pain in unspecified hip: Secondary | ICD-10-CM

## 2011-07-17 DIAGNOSIS — IMO0001 Reserved for inherently not codable concepts without codable children: Secondary | ICD-10-CM | POA: Insufficient documentation

## 2011-07-17 DIAGNOSIS — I10 Essential (primary) hypertension: Secondary | ICD-10-CM | POA: Insufficient documentation

## 2011-07-17 DIAGNOSIS — K219 Gastro-esophageal reflux disease without esophagitis: Secondary | ICD-10-CM | POA: Insufficient documentation

## 2011-07-17 DIAGNOSIS — M5431 Sciatica, right side: Secondary | ICD-10-CM

## 2011-07-17 MED ORDER — HYDROCODONE-ACETAMINOPHEN 5-325 MG PO TABS: 2.0000 | ORAL_TABLET | ORAL | Status: AC | PRN

## 2011-07-17 MED ORDER — METHYLPREDNISOLONE SODIUM SUCC 125 MG IJ SOLR
125.0000 mg | Freq: Once | INTRAMUSCULAR | Status: AC
  Administered 2011-07-17: 125 mg via INTRAMUSCULAR
  Filled 2011-07-17: qty 2

## 2011-07-17 NOTE — ED Notes (Signed)
Pt c/o right hip pain x 1 week.  HX bursitis

## 2011-07-17 NOTE — ED Notes (Signed)
Pt ambulatory to [facility]

## 2011-07-17 NOTE — ED Provider Notes (Addendum)
History     CSN: [id number] Arrival date & time: 07/17/2011  1:08 PM   First MD Initiated Contact with Patient 07/17/11 1332      Chief Complaint  Patient presents with  . Hip Pain    (Consider location/radiation/quality/duration/timing/severity/associated sxs/prior treatment) Patient is a 65 y.o. female presenting with hip pain. The history is provided by the patient. No language interpreter was used.  Hip Pain This is a recurrent problem. The current episode started in the past 7 days. The problem occurs intermittently. The problem has been gradually worsening. Associated symptoms include joint swelling and myalgias. The symptoms are aggravated by nothing. She has tried nothing for the symptoms. The treatment provided moderate relief.  Pt was seen at [facility] and started on muscle relaxer.   Pt complains of pain in right hip Past Medical History  Diagnosis Date  . ALLERGIC RHINITIS 08/28/2010  . DIVERTICULOSIS, COLON 08/28/2010  . FATIGUE 08/28/2010  . GERD 11/10/2010  . HYPERTENSION 08/28/2010  . OBSTRUCTIVE SLEEP APNEA 08/28/2010    Past Surgical History  Procedure Date  . Abdominal hysterectomy 1986  . Breast surgery 1973    Breast biopsy    Family History  Problem Relation Age of Onset  . Arthritis Mother   . Arthritis Father   . Alcohol abuse Other     parent    History  Substance Use Topics  . Smoking status: Never Smoker   . Smokeless tobacco: Not on file   Comment: Married, lives with spouse, dtr 2 g-kids. employed at [employer] at [employer]  . Alcohol Use: No    OB History    Grav Para Term Preterm Abortions TAB SAB Ect Mult Living                  Review of Systems  Musculoskeletal: Positive for myalgias and joint swelling.  All other systems reviewed and are negative.    Allergies  Lisinopril-hydrochlorothiazide  Home Medications   Current Outpatient Rx  Name Route Sig Dispense Refill  . ALBUTEROL 90 MCG/ACT IN AERS  Inhalation Inhale 2 puffs into the lungs 2 (two) times daily as needed.      Marland Kitchen VITAMIN D 2000 UNITS PO CAPS Oral Take by mouth daily.      Marland Kitchen LOSARTAN POTASSIUM 100 MG PO TABS Oral Take 100 mg by mouth daily.      Marland Kitchen ONE-DAILY MULTI VITAMINS PO TABS Oral Take 1 tablet by mouth daily.      Marland Kitchen FISH OIL 1000 MG PO CAPS Oral Take by mouth daily.      Marland Kitchen OMEPRAZOLE 20 MG PO CPDR Oral Take 20 mg by mouth 2 (two) times daily.        BP 179/83  Pulse 64  Temp 98 F (36.7 C)  Resp 16  Ht 5' (1.524 m)  Wt 220 lb (99.791 kg)  BMI 42.97 kg/m2  SpO2 99%  Physical Exam  Nursing note and vitals reviewed. Constitutional: She is oriented to person, place, and time. She appears well-developed and well-nourished.  HENT:  Head: Normocephalic.  Eyes: Pupils are equal, round, and reactive to light.  Neck: Normal range of motion.  Cardiovascular: Normal rate.   Pulmonary/Chest: Effort normal.  Abdominal: Soft.  Musculoskeletal: Normal range of motion.       Tender hip and LS spine,  From  nv and ns intact.  Right hip is tender to palpation  Neurological: She is alert and oriented to person, place, and  time. She has normal reflexes.  Skin: Skin is warm.  Psychiatric: She has a normal mood and affect.    ED Course  Procedures (including critical care time)  Labs Reviewed - No data to display Dg Lumbar Spine Complete  07/17/2011  *RADIOLOGY REPORT*  Clinical Data: Right hip pain.  LUMBAR SPINE - COMPLETE 4+ VIEW  Comparison: None.  Findings: Five non-rib bearing lumbar type vertebral bodies are present.  The vertebral body heights and alignment are normal.  The disc spaces are preserved.  Mild facet degenerative changes are noted bilaterally L5-S1.  IMPRESSION:  1.  No acute bone or soft tissue abnormality. 2.  Mild facet degenerative changes bilaterally at L5-S1. 3.  Mild right-sided endplate changes at L4-5.  Original Report Authenticated By: Jamesetta Orleans. MATTERN, M.D.   Dg Hip Complete  Right  07/17/2011  *RADIOLOGY REPORT*  Clinical Data: Right hip pain.  RIGHT HIP - COMPLETE 2+ VIEW  Comparison: None.  Findings: The right hip is located.  Joint space is preserved. Mild degenerative changes are noted.  No acute abnormality is present.  Degenerative changes are noted in the lower lumbar spine, particular at the L4-5 level.  IMPRESSION:  1.  No acute bone or soft tissue abnormality about the right hip. 2.  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  Original Report Authenticated By: Jamesetta Orleans. MATTERN, M.D.     No diagnosis found.    MDM  Pt has not had an injury.  Pt has had similar pain in the past.  Pt has not had for about 6 months.  No injury.        Langston Masker, [place] 07/17/11 1520  [place], [place] 07/17/11 1521  Langston Masker, [place] 07/22/11 2251

## 2011-07-18 NOTE — ED Provider Notes (Signed)
History/physical exam/procedure(s) were performed by non-physician practitioner and as supervising physician I was immediately available for consultation/collaboration. I have reviewed all notes and am in agreement with care and plan.  Hilario Quarry, MD 07/18/11 [phone]

## 2011-07-24 NOTE — ED Provider Notes (Signed)
History/physical exam/procedure(s) were performed by non-physician practitioner and as supervising physician I was immediately available for consultation/collaboration. I have reviewed all notes and am in agreement with care and plan.   Derrika Ruffalo S Saniyah Mondesir, MD 07/24/11 1533 

## 2012-08-30 ENCOUNTER — Encounter (HOSPITAL_BASED_OUTPATIENT_CLINIC_OR_DEPARTMENT_OTHER): Payer: Self-pay | Admitting: *Deleted

## 2012-08-30 ENCOUNTER — Emergency Department (HOSPITAL_BASED_OUTPATIENT_CLINIC_OR_DEPARTMENT_OTHER)
Admission: EM | Admit: 2012-08-30 | Discharge: 2012-08-30 | Disposition: A | Discharge status: Home / Self Care | Payer: Medicare HMO | Attending: Emergency Medicine | Admitting: Emergency Medicine

## 2012-08-30 DIAGNOSIS — I10 Essential (primary) hypertension: Secondary | ICD-10-CM | POA: Insufficient documentation

## 2012-08-30 DIAGNOSIS — Y939 Activity, unspecified: Secondary | ICD-10-CM | POA: Insufficient documentation

## 2012-08-30 DIAGNOSIS — J309 Allergic rhinitis, unspecified: Secondary | ICD-10-CM | POA: Insufficient documentation

## 2012-08-30 DIAGNOSIS — G4733 Obstructive sleep apnea (adult) (pediatric): Secondary | ICD-10-CM | POA: Insufficient documentation

## 2012-08-30 DIAGNOSIS — R5381 Other malaise: Secondary | ICD-10-CM | POA: Insufficient documentation

## 2012-08-30 DIAGNOSIS — Z8719 Personal history of other diseases of the digestive system: Secondary | ICD-10-CM | POA: Insufficient documentation

## 2012-08-30 DIAGNOSIS — R5383 Other fatigue: Secondary | ICD-10-CM | POA: Insufficient documentation

## 2012-08-30 DIAGNOSIS — Y929 Unspecified place or not applicable: Secondary | ICD-10-CM | POA: Insufficient documentation

## 2012-08-30 DIAGNOSIS — W19XXXA Unspecified fall, initial encounter: Secondary | ICD-10-CM | POA: Insufficient documentation

## 2012-08-30 DIAGNOSIS — K219 Gastro-esophageal reflux disease without esophagitis: Secondary | ICD-10-CM | POA: Insufficient documentation

## 2012-08-30 DIAGNOSIS — Z79899 Other long term (current) drug therapy: Secondary | ICD-10-CM | POA: Insufficient documentation

## 2012-08-30 DIAGNOSIS — M5430 Sciatica, unspecified side: Secondary | ICD-10-CM | POA: Insufficient documentation

## 2012-08-30 HISTORY — DX: Sciatica, unspecified side: M54.30

## 2012-08-30 MED ORDER — OMEPRAZOLE 20 MG PO CPDR: 20.0000 mg | DELAYED_RELEASE_CAPSULE | Freq: Two times a day (BID) | ORAL | Status: DC | PRN

## 2012-08-30 MED ORDER — PREDNISONE 10 MG PO TABS: ORAL_TABLET | ORAL | Status: DC

## 2012-08-30 MED ORDER — LOSARTAN POTASSIUM 100 MG PO TABS: 100.0000 mg | ORAL_TABLET | Freq: Every day | ORAL | Status: DC

## 2012-08-30 MED ORDER — OXYCODONE-ACETAMINOPHEN 5-325 MG PO TABS: 1.0000 | ORAL_TABLET | ORAL | Status: DC | PRN

## 2012-08-30 NOTE — ED Provider Notes (Signed)
History     CSN: [id number]  Arrival date & time 08/30/12  1112   First MD Initiated Contact with Patient 08/30/12 1208      Chief Complaint  Patient presents with  . Sciatica    (Consider location/radiation/quality/duration/timing/severity/associated sxs/prior treatment) Patient is a 66 y.o. female presenting with back pain. The history is provided by the patient. No language interpreter was used.  Back Pain  This is a new problem. The current episode started more than 1 week ago. The problem occurs constantly. The problem has been gradually worsening. The pain is associated with falling. The quality of the pain is described as stabbing and shooting. The pain radiates to the left thigh. The pain is at a severity of 7/10. The pain is moderate. The pain is the same all the time. Stiffness is present all day.  Pt has a history of sciatica.  Pt fell a week ago and sciatica has flared up.   Pt reports she did not hit the area of pain.  Pt reports no pain after fall,  Soreness began afterwards.  (Pt is also out of blood pressure medications)    Past Medical History  Diagnosis Date  . ALLERGIC RHINITIS 08/28/2010  . DIVERTICULOSIS, COLON 08/28/2010  . FATIGUE 08/28/2010  . GERD 11/10/2010  . HYPERTENSION 08/28/2010  . OBSTRUCTIVE SLEEP APNEA 08/28/2010  . Sciatica     Past Surgical History  Procedure Date  . Abdominal hysterectomy 1986  . Breast surgery 1973    Breast biopsy    Family History  Problem Relation Age of Onset  . Arthritis Mother   . Arthritis Father   . Alcohol abuse Other     parent    History  Substance Use Topics  . Smoking status: Never Smoker   . Smokeless tobacco: Not on file     Comment: Married, lives with spouse, dtr 2 g-kids. employed at [employer] at [employer]  . Alcohol Use: No    OB History    Grav Para Term Preterm Abortions TAB SAB Ect Mult Living                  Review of Systems  Musculoskeletal: Positive for  back pain.  All other systems reviewed and are negative.    Allergies  Codeine and Lisinopril-hydrochlorothiazide  Home Medications   Current Outpatient Rx  Name  Route  Sig  Dispense  Refill  . ONE-DAILY MULTI VITAMINS PO TABS   Oral   Take 1 tablet by mouth daily.           . OMEGA 3-6-9 COMPLEX PO   Oral   Take 1 capsule by mouth daily.           Marland Kitchen FISH OIL 1000 MG PO CAPS   Oral   Take by mouth daily.          . ALBUTEROL 90 MCG/ACT IN AERS   Inhalation   Inhale 2 puffs into the lungs 2 (two) times daily as needed. For shortness of breath and wheezing         . VITAMIN D 1000 UNITS PO TABS   Oral   Take 2,000 Units by mouth daily.           Marland Kitchen VITAMIN D 2000 UNITS PO CAPS   Oral   Take by mouth daily.          . IBUPROFEN 200 MG PO TABS   Oral   Take 400  mg by mouth every 6 (six) hours as needed. For pain          . LOSARTAN POTASSIUM 100 MG PO TABS   Oral   Take 100 mg by mouth daily.           Marland Kitchen OMEPRAZOLE 20 MG PO CPDR   Oral   Take 20 mg by mouth 2 (two) times daily as needed. For acid reflux           BP 181/54  Pulse 57  Temp 98.7 F (37.1 C) (Oral)  Resp 20  Ht 5' (1.524 m)  Wt 212 lb (96.163 kg)  BMI 41.40 kg/m2  SpO2 100%  Physical Exam  Nursing note and vitals reviewed. Constitutional: She is oriented to person, place, and time. She appears well-developed.  HENT:  Head: Normocephalic.  Eyes: Conjunctivae normal are normal. Pupils are equal, round, and reactive to light.  Cardiovascular: Normal rate.   Pulmonary/Chest: Effort normal.  Abdominal: Soft.  Musculoskeletal: Normal range of motion.  Neurological: She is alert and oriented to person, place, and time. She has normal reflexes.    ED Course  Procedures (including critical care time)  Labs Reviewed - No data to display No results found.   1. Sciatica   2. HTN (hypertension)       MDM  Pt advised follow up with Dr. Felicity Coyer for recheck.          Lonia Skinner [place], [place] 08/30/12 [phone]

## 2012-08-30 NOTE — ED Notes (Signed)
Pt BP is elevated.  Pt reports that she has been out of her medications for about a year.  She was without insurance so she was difficult to afford.  Pt reports that she now has insurance again and plans on seeing MD and getting prescriptions

## 2012-08-30 NOTE — ED Notes (Signed)
Pt is here for left hip pain, sciatica type pain in left hip, buttock and down her left leg.  Pt was diagnosed with sciatica 12 years ago.  Pt fell a few weeks ago and began having worsening left hip pain.  Pt describes a sciatica type pain with sharp pain in her left buttock and shooting pain down her left leg.

## 2012-08-31 NOTE — ED Provider Notes (Signed)
History/physical exam/procedure(s) were performed by non-physician practitioner and as supervising physician I was immediately available for consultation/collaboration. I have reviewed all notes and am in agreement with care and plan.   Larra Crunkleton S Shravya Wickwire, MD 08/31/12 0811 

## 2013-01-01 ENCOUNTER — Encounter (INDEPENDENT_AMBULATORY_CARE_PROVIDER_SITE_OTHER): Payer: Self-pay | Admitting: Surgery

## 2013-01-01 ENCOUNTER — Telehealth (INDEPENDENT_AMBULATORY_CARE_PROVIDER_SITE_OTHER): Payer: Self-pay | Admitting: *Deleted

## 2013-01-01 NOTE — Telephone Encounter (Signed)
Patient called to state she has had a change in the color of the drainage in her drain.  Patient states the amount continues to decrease as for as output the the color change is a concern.  Patient scheduled for an urgent office appt today.

## 2013-01-01 NOTE — Telephone Encounter (Signed)
Charted in error on the wrong patient

## 2013-02-26 ENCOUNTER — Other Ambulatory Visit (INDEPENDENT_AMBULATORY_CARE_PROVIDER_SITE_OTHER): Payer: Medicare HMO

## 2013-02-26 ENCOUNTER — Encounter: Payer: Self-pay | Admitting: Internal Medicine

## 2013-02-26 ENCOUNTER — Ambulatory Visit (INDEPENDENT_AMBULATORY_CARE_PROVIDER_SITE_OTHER): Payer: Medicare HMO | Admitting: Internal Medicine

## 2013-02-26 VITALS — BP 152/90 | HR 51 | Temp 99.2°F | Wt 216.2 lb

## 2013-02-26 DIAGNOSIS — F329 Major depressive disorder, single episode, unspecified: Secondary | ICD-10-CM

## 2013-02-26 DIAGNOSIS — Z0000 Encounter for general adult medical examination without abnormal findings: Secondary | ICD-10-CM

## 2013-02-26 DIAGNOSIS — Z23 Encounter for immunization: Secondary | ICD-10-CM

## 2013-02-26 DIAGNOSIS — I10 Essential (primary) hypertension: Secondary | ICD-10-CM

## 2013-02-26 DIAGNOSIS — F3289 Other specified depressive episodes: Secondary | ICD-10-CM

## 2013-02-26 DIAGNOSIS — Z1239 Encounter for other screening for malignant neoplasm of breast: Secondary | ICD-10-CM

## 2013-02-26 DIAGNOSIS — G4733 Obstructive sleep apnea (adult) (pediatric): Secondary | ICD-10-CM

## 2013-02-26 LAB — LIPID PANEL
Cholesterol: 177 mg/dL (ref 0–200)
HDL: 41.2 mg/dL (ref >39.00)
LDL Cholesterol: 128 mg/dL — ABNORMAL HIGH (ref 0–99)
Triglycerides: 37 mg/dL (ref 0.0–149.0)
VLDL: 7.4 mg/dL (ref 0.0–40.0)

## 2013-02-26 LAB — CBC WITH DIFFERENTIAL/PLATELET
Basophils Relative: 0.5 % (ref 0.0–3.0)
Eosinophils Relative: 4.4 % (ref 0.0–5.0)
Lymphocytes Relative: 24.1 % (ref 12.0–46.0)
MCV: 90.5 fl (ref 78.0–100.0)
Neutrophils Relative %: 62.5 % (ref 43.0–77.0)
RBC: 4.59 Mil/uL (ref 3.87–5.11)
WBC: 8.8 10*3/uL (ref 4.5–10.5)

## 2013-02-26 LAB — BASIC METABOLIC PANEL
Calcium: 9.2 mg/dL (ref 8.4–10.5)
Chloride: 104 mEq/L (ref 96–112)
Creatinine, Ser: 0.7 mg/dL (ref 0.4–1.2)

## 2013-02-26 LAB — URINALYSIS, ROUTINE W REFLEX MICROSCOPIC
Specific Gravity, Urine: 1.015 (ref 1.000–1.030)
Total Protein, Urine: NEGATIVE
Urine Glucose: NEGATIVE
pH: 6.5 (ref 5.0–8.0)

## 2013-02-26 LAB — HEPATIC FUNCTION PANEL
Albumin: 3.7 g/dL (ref 3.5–5.2)
Alkaline Phosphatase: 78 U/L (ref 39–117)
Total Protein: 7.1 g/dL (ref 6.0–8.3)

## 2013-02-26 MED ORDER — FLUOXETINE HCL 10 MG PO TABS: 10.0000 mg | ORAL_TABLET | Freq: Every day | ORAL | Status: DC

## 2013-02-26 MED ORDER — MELOXICAM 15 MG PO TABS: 15.0000 mg | ORAL_TABLET | Freq: Every day | ORAL | Status: DC

## 2013-02-26 MED ORDER — LOSARTAN POTASSIUM 100 MG PO TABS: 100.0000 mg | ORAL_TABLET | Freq: Every day | ORAL | Status: DC

## 2013-02-26 NOTE — Assessment & Plan Note (Signed)
Resume prior ARB, call if symptoms greater than 6 months Followup in 4 weeks, patient call sooner of uncontrolled - hydrate medications as noted BP Readings from Last 3 Encounters:  02/26/13 152/90  08/30/12 181/54  07/17/11 179/83

## 2013-02-26 NOTE — Patient Instructions (Signed)
It was good to see you today. We have reviewed your prior records including labs and tests today Health Maintenance reviewed - pneumonia vaccine updated today and will refer for mammography - all other recommended immunizations and age-appropriate screenings are up-to-date. Test(s) ordered today. Your results will be released to MyChart (or called to you) after review, usually within 72hours after test completion. If any changes need to be made, you will be notified at that same time. Resume uncertain once daily for blood pressure Start meloxicam once daily for arthritis Start generic Prozac for depression Your prescription(s) have been submitted to your pharmacy. Please take as directed and contact our office if you believe you are having problem(s) with the medication(s). we'll make referral to breast center for mammography and to sleep specialist for review of your CPAP equipment . Our office will contact you regarding appointment(s) once made. Please schedule followup in 4-6 weeks, call sooner if problems.  Health Maintenance, Females A healthy lifestyle and preventative care can promote health and wellness.  Maintain regular health, dental, and eye exams.  Eat a healthy diet. Foods like vegetables, fruits, whole grains, low-fat dairy products, and lean protein foods contain the nutrients you need without too many calories. Decrease your intake of foods high in solid fats, added sugars, and salt. Get information about a proper diet from your caregiver, if necessary.  Regular physical exercise is one of the most important things you can do for your health. Most adults should get at least 150 minutes of moderate-intensity exercise (any activity that increases your heart rate and causes you to sweat) each week. In addition, most adults need muscle-strengthening exercises on 2 or more days a week.   Maintain a healthy weight. The body mass index (BMI) is a screening tool to identify possible  weight problems. It provides an estimate of body fat based on height and weight. Your caregiver can help determine your BMI, and can help you achieve or maintain a healthy weight. For adults 20 years and older:  A BMI below 18.5 is considered underweight.  A BMI of 18.5 to 24.9 is normal.  A BMI of 25 to 29.9 is considered overweight.  A BMI of 30 and above is considered obese.  Maintain normal blood lipids and cholesterol by exercising and minimizing your intake of saturated fat. Eat a balanced diet with plenty of fruits and vegetables. Blood tests for lipids and cholesterol should begin at age 3 and be repeated every 5 years. If your lipid or cholesterol levels are high, you are over 50, or you are a high risk for heart disease, you may need your cholesterol levels checked more frequently.Ongoing high lipid and cholesterol levels should be treated with medicines if diet and exercise are not effective.  If you smoke, find out from your caregiver how to quit. If you do not use tobacco, do not start.  If you are pregnant, do not drink alcohol. If you are breastfeeding, be very cautious about drinking alcohol. If you are not pregnant and choose to drink alcohol, do not exceed 1 drink per day. One drink is considered to be 12 ounces (355 mL) of beer, 5 ounces (148 mL) of wine, or 1.5 ounces (44 mL) of liquor.  Avoid use of street drugs. Do not share needles with anyone. Ask for help if you need support or instructions about stopping the use of drugs.  High blood pressure causes heart disease and increases the risk of stroke. Blood pressure should be checked  at least every 1 to 2 years. Ongoing high blood pressure should be treated with medicines, if weight loss and exercise are not effective.  If you are 80 to 66 years old, ask your caregiver if you should take aspirin to prevent strokes.  Diabetes screening involves taking a blood sample to check your fasting blood sugar level. This should be  done once every 3 years, after age 22, if you are within normal weight and without risk factors for diabetes. Testing should be considered at a younger age or be carried out more frequently if you are overweight and have at least 1 risk factor for diabetes.  Breast cancer screening is essential preventative care for women. You should practice "breast self-awareness." This means understanding the normal appearance and feel of your breasts and may include breast self-examination. Any changes detected, no matter how small, should be reported to a caregiver. Women in their 71s and 30s should have a clinical breast exam (CBE) by a caregiver as part of a regular health exam every 1 to 3 years. After age 68, women should have a CBE every year. Starting at age 43, women should consider having a mammogram (breast X-ray) every year. Women who have a family history of breast cancer should talk to their caregiver about genetic screening. Women at a high risk of breast cancer should talk to their caregiver about having an MRI and a mammogram every year.  The Pap test is a screening test for cervical cancer. Women should have a Pap test starting at age 19. Between ages 12 and 64, Pap tests should be repeated every 2 years. Beginning at age 5, you should have a Pap test every 3 years as long as the past 3 Pap tests have been normal. If you had a hysterectomy for a problem that was not cancer or a condition that could lead to cancer, then you no longer need Pap tests. If you are between ages 33 and 67, and you have had normal Pap tests going back 10 years, you no longer need Pap tests. If you have had past treatment for cervical cancer or a condition that could lead to cancer, you need Pap tests and screening for cancer for at least 20 years after your treatment. If Pap tests have been discontinued, risk factors (such as a new sexual partner) need to be reassessed to determine if screening should be resumed. Some women have  medical problems that increase the chance of getting cervical cancer. In these cases, your caregiver may recommend more frequent screening and Pap tests.  The human papillomavirus (HPV) test is an additional test that may be used for cervical cancer screening. The HPV test looks for the virus that can cause the cell changes on the cervix. The cells collected during the Pap test can be tested for HPV. The HPV test could be used to screen women aged 45 years and older, and should be used in women of any age who have unclear Pap test results. After the age of 51, women should have HPV testing at the same frequency as a Pap test.  Colorectal cancer can be detected and often prevented. Most routine colorectal cancer screening begins at the age of 97 and continues through age 70. However, your caregiver may recommend screening at an earlier age if you have risk factors for colon cancer. On a yearly basis, your caregiver may provide home test kits to check for hidden blood in the stool. Use of a small camera  at the end of a tube, to directly examine the colon (sigmoidoscopy or colonoscopy), can detect the earliest forms of colorectal cancer. Talk to your caregiver about this at age 10, when routine screening begins. Direct examination of the colon should be repeated every 5 to 10 years through age 10, unless early forms of pre-cancerous polyps or small growths are found.  Hepatitis C blood testing is recommended for all people born from [birth year] through 1964 and any individual with known risks for hepatitis C.  Practice safe sex. Use condoms and avoid high-risk sexual practices to reduce the spread of sexually transmitted infections (STIs). Sexually active women aged 72 and younger should be checked for Chlamydia, which is a common sexually transmitted infection. Older women with new or multiple partners should also be tested for Chlamydia. Testing for other STIs is recommended if you are sexually active and at  increased risk.  Osteoporosis is a disease in which the bones lose minerals and strength with aging. This can result in serious bone fractures. The risk of osteoporosis can be identified using a bone density scan. Women ages 57 and over and women at risk for fractures or osteoporosis should discuss screening with their caregivers. Ask your caregiver whether you should be taking a calcium supplement or vitamin D to reduce the rate of osteoporosis.  Menopause can be associated with physical symptoms and risks. Hormone replacement therapy is available to decrease symptoms and risks. You should talk to your caregiver about whether hormone replacement therapy is right for you.  Use sunscreen with a sun protection factor (SPF) of 30 or greater. Apply sunscreen liberally and repeatedly throughout the day. You should seek shade when your shadow is shorter than you. Protect yourself by wearing long sleeves, pants, a wide-brimmed hat, and sunglasses year round, whenever you are outdoors.  Notify your caregiver of new moles or changes in moles, especially if there is a change in shape or color. Also notify your caregiver if a mole is larger than the size of a pencil eraser.  Stay current with your immunizations. Document Released: 03/26/2011 Document Revised: 12/03/2011 Document Reviewed: 03/26/2011 [facility] Patient Information 2014 [place], [place]. Depression, Adult Depression is feeling sad, low, down in the dumps, blue, gloomy, or empty. In general, there are two kinds of depression:  Normal sadness or grief. This can happen after something upsetting. It often goes away on its own within 2 weeks. After losing a loved one (bereavement), normal sadness and grief may last longer than two weeks. It usually gets better with time.  Clinical depression. This kind lasts longer than normal sadness or grief. It keeps you from doing the things you normally do in life. It is often hard to function at home, work, or at  school. It may affect your relationships with others. Treatment is often needed. GET HELP RIGHT AWAY IF:  You have thoughts about hurting yourself or others.  You lose touch with reality (psychotic symptoms). You may:  See or hear things that are not real.  Have untrue beliefs about your life or people around you.  Your medicine is giving you problems. MAKE SURE YOU:  Understand these instructions.  Will watch your condition.  Will get help right away if you are not doing well or get worse. Document Released: 10/13/2010 Document Revised: 06/04/2012 Document Reviewed: 10/13/2010 [facility] Patient Information 2014 [place], [place].

## 2013-02-26 NOTE — Assessment & Plan Note (Signed)
Chronic dysthymia, progressive symptoms in past 3 months Verified no SI/HI Labs today to rule out metabolic problem with CPX Begin generic fluoxetine - we reviewed potential risk/benefit and possible side effects - pt understands and agrees to same  Followup in 4-6 weeks on same, patient call sooner if problems

## 2013-02-26 NOTE — Progress Notes (Signed)
Subjective:    Patient ID: Anne Barnes, female    DOB: 07-04-1946, 67 y.o.   MRN: [id number]  HPI Last OV 10/2010  Here for medicare wellness  Diet: heart healthy  Physical activity: sedentary Depression/mood screen: negative Hearing: intact to whispered voice Visual acuity: grossly normal, performs annual eye exam  ADLs: capable Fall risk: none Home safety: good Cognitive evaluation: intact to orientation, naming, recall and repetition EOL planning: adv directives, full code/ I agree  I have personally reviewed and have noted 1. The patient's medical and social history 2. Their use of alcohol, tobacco or illicit drugs 3. Their current medications and supplements 4. The patient's functional ability including ADL's, fall risks, home safety risks and hearing or visual impairment. 5. Diet and physical activities 6. Evidence for depression or mood disorders  Concerned about possible depression -  Describes overwhelming motivational and physical fatigue -  Associated with poor sleep and low energy, no tearfulness but easy irritability symptoms ongoing >3 mo Denies prior treatment for same, but be interested in medication at this time Denies SI/HI  Also reviewed chronic medical issues - OSA (noncompliant with CPAP due to poor fitting mask) - in need of followup for new equipment HTN (noncompliant with meds >77mo) - no headache, edema or chest pain Osteoarthritis -no joint swelling, takes Tylenol as needed for same with ineffective relief, requests prescription medication  Past Medical History  Diagnosis Date  . ALLERGIC RHINITIS   . DIVERTICULOSIS, COLON   . GERD   . HYPERTENSION   . OBSTRUCTIVE SLEEP APNEA 12/2008 dx    noncompliant with CPAP qhs  . Sciatica     right side   Family History  Problem Relation Age of Onset  . Arthritis Mother   . Arthritis Father   . Alcohol abuse Other     parent   History  Substance Use Topics  . Smoking status: Never Smoker    . Smokeless tobacco: Not on file     Comment: Married, lives with spouse, dtr 2 g-kids.  . Alcohol Use: No    Review of Systems Constitutional: Negative for fever or unexpected weight change.  Respiratory: Negative for cough and shortness of breath.   Cardiovascular: Negative for chest pain or palpitations.  Gastrointestinal: Negative for abdominal pain, no bowel changes.  Musculoskeletal: Negative for gait problem or joint swelling.  Skin: Negative for rash.  Neurological: Negative for dizziness or headache.  No other specific complaints in a complete review of systems (except as listed in HPI above).     Objective:   Physical Exam BP 152/90  Pulse 51  Temp(Src) 99.2 F (37.3 C) (Oral)  Wt 216 lb 3.2 oz (98.068 kg)  BMI 42.22 kg/m2  SpO2 95% Wt Readings from Last 3 Encounters:  02/26/13 216 lb 3.2 oz (98.068 kg)  08/30/12 212 lb (96.163 kg)  07/17/11 220 lb (99.791 kg)   Constitutional: She is obese, but appears well-developed and well-nourished. No distress.  HENT: Head: Normocephalic and atraumatic. Ears: B TMs ok, no erythema or effusion; Nose: Nose normal. Mouth/Throat: Oropharynx is clear and moist. No oropharyngeal exudate.  Eyes: Conjunctivae and EOM are normal. Pupils are equal, round, and reactive to light. No scleral icterus.  Neck: Thick; Normal range of motion. Neck supple. No JVD present. No thyromegaly present.  Cardiovascular: Normal rate, regular rhythm and normal heart sounds.  No murmur heard. No BLE edema. Pulmonary/Chest: Effort normal and breath sounds normal. No respiratory distress. She has no wheezes.  Abdominal: Soft. Bowel sounds are normal. She exhibits no distension. There is no tenderness. no masses Musculoskeletal: Normal range of motion, no joint effusions. No gross deformities Neurological: She is alert and oriented to person, place, and time. No cranial nerve deficit. Coordination, balance, strength, speech and gait are normal.  Skin: Skin is  warm and dry. No rash noted. No erythema.  Psychiatric: She has a dysphoric mood and affect. Her behavior is normal. Judgment and thought content normal.   Lab Results  Component Value Date   WBC 9.4 08/28/2010   HGB 14.2 08/28/2010   HCT 41.1 08/28/2010   PLT 242.0 08/28/2010   GLUCOSE 94 08/28/2010   CHOL 177 12/30/2009   TRIG 56 12/15/2009   HDL 39 12/30/2009   LDLCALC 846 12/30/2009   ALT 16 08/28/2010   AST 20 08/28/2010   NA 137 08/28/2010   K 3.9 08/28/2010   CL 98 08/28/2010   CREATININE 0.9 08/28/2010   BUN 17 08/28/2010   CO2 31 08/28/2010   TSH 1.80 08/28/2010   HGBA1C  Value: 5.5 (NOTE) The ADA recommends the following therapeutic goal for glycemic control related to Hgb A1c measurement: Goal of therapy: <6.5 Hgb A1c  Reference: American Diabetes Association: Clinical Practice Recommendations 2010, Diabetes Care, 2010, 33: (Suppl  1). 12/15/2009    ECG: sinus brady @ 51 - neg T waves anterior    Assessment & Plan:    AWV/CPX/v70.0 - Today patient counseled on age appropriate routine health concerns for screening and prevention, each reviewed and up to date or declined. Immunizations reviewed and up to date or declined. Labs/ECG reviewed. Risk factors for depression reviewed and negative. Hearing function and visual acuity are intact. ADLs screened and addressed as needed. Functional ability and level of safety reviewed and appropriate. Education, counseling and referrals performed based on assessed risks today. Patient provided with a copy of personalized plan for preventive services.  Also See problem list. Medications and labs reviewed today.

## 2013-02-26 NOTE — Assessment & Plan Note (Signed)
Diagnosed by sleep study April 2000 [facility] - no copy of sleep study available) Reports noncompliance with CPAP due to poor fitting mask Refer to pulmonary for further evaluation and adjustment treatment as needed Reminded of and educated on importance of weight loss and controlling same

## 2013-03-03 ENCOUNTER — Telehealth: Payer: Self-pay | Admitting: Internal Medicine

## 2013-03-03 NOTE — Telephone Encounter (Signed)
Pt informed of lab results via result note. 

## 2013-03-03 NOTE — Telephone Encounter (Signed)
Pt called triage requesting lab results.

## 2013-03-09 ENCOUNTER — Ambulatory Visit
Admission: RE | Admit: 2013-03-09 | Discharge: 2013-03-09 | Disposition: A | Discharge status: Home / Self Care | Payer: Medicare HMO | Source: Ambulatory Visit | Attending: Internal Medicine | Admitting: Internal Medicine

## 2013-03-09 DIAGNOSIS — Z1239 Encounter for other screening for malignant neoplasm of breast: Secondary | ICD-10-CM

## 2013-03-17 ENCOUNTER — Encounter: Payer: Self-pay | Admitting: Pulmonary Disease

## 2013-03-17 ENCOUNTER — Ambulatory Visit (INDEPENDENT_AMBULATORY_CARE_PROVIDER_SITE_OTHER): Payer: Medicare HMO | Admitting: Pulmonary Disease

## 2013-03-17 VITALS — BP 150/82 | HR 60 | Temp 98.6°F | Ht 60.0 in | Wt 220.0 lb

## 2013-03-17 DIAGNOSIS — G4733 Obstructive sleep apnea (adult) (pediatric): Secondary | ICD-10-CM

## 2013-03-17 NOTE — Assessment & Plan Note (Signed)
The patient has a long history of mild obstructive sleep apnea, and has responded very well in the past to CPAP.  She has not worn the device in approximately one year because of mask issues, and the inability to afford replacements and supplies.  She is now having worsening sleep and daytime symptoms, and will greatly benefit from restarting CPAP.  I will have her medical equipment company check her machine functioning, and also arrange for new supplies and a mask.  I would like to see her back in 6 weeks to check on progress.  We may have to re\re optimize her pressure on the automatic setting at some point.

## 2013-03-17 NOTE — Patient Instructions (Addendum)
Will get you started back on cpap.  Will use the same pressure for now, and have your equipment company show you the different types of masks. Work on weight loss followup with me in 6 weeks, but call if having tolerance issues.

## 2013-03-17 NOTE — Progress Notes (Signed)
Subjective:    Patient ID: Anne Barnes, female    DOB: May 20, 1946, 67 y.o.   MRN: [id number]  HPI The patient is a 67 year old female who I've been asked to see for management of obstructive sleep apnea.  The patient was diagnosed with mild OSA in 2010, with an AHI of 13 of the per hour and oxygen desaturation as low as 78%.  She was started on CPAP at that time, and had a significant improvement in her symptoms.  She wore the device compliantly until about one year ago, when she lost her insurance benefits, and could not afford the mask changes or supplies.  Since that time, she has noticed significant worsening of her sleep.  She is having frequent awakenings at night, and does not feel rested in the mornings upon arising.  Her husband does not pay attention to her sleeping pattern, and has not commented on snoring or an abnormal breathing pattern during sleep.  The patient notes definite sleep pressure during the day with inactivity, and can fall asleep easily watching television.  She has no sleepiness with driving.  The patient states that her weight is neutral over the last 2 years, but her epworth score today is very abnormal at 19.  Sleep Questionnaire What time do you typically go to bed?( Between what hours) 10-11p 10-11p at 1104 on 03/17/13 by Nita Sells, CMA How long does it take you to fall asleep?  at 1104 on 03/17/13 by Nita Sells, CMA How many times during the night do you wake up? 4 4 at 1104 on 03/17/13 by Nita Sells, CMA What time do you get out of bed to start your day? [id number] [id number] at 1104 on 03/17/13 by Nita Sells, CMA Do you drive or operate heavy machinery in your occupation? No No at 1104 on 03/17/13 by Nita Sells, CMA How much has your weight changed (up or down) over the past two years? (In pounds) 0 oz (0 kg) 0 oz (0 kg) at 1104 on 03/17/13 by Nita Sells, CMA Have you ever had a sleep study before? Yes Yes at 1104 on  03/17/13 by Nita Sells, CMA If yes, location of study? [facility] at 1104 on 03/17/13 by Nita Sells, CMA If yes, date of study? 2010? 2010? at 1104 on 03/17/13 by Nita Sells, CMA Do you currently use CPAP? Yes Yes at 1104 on 03/17/13 by Marjo Bicker Mabe, CMA If so, what pressure? 2? 2? at 1104 on 03/17/13 by Nita Sells, CMA Do you wear oxygen at any time? No No at 1104 on 03/17/13 by Marjo Bicker Mabe, CMA  Review of Systems  Constitutional: Negative for fever and unexpected weight change.  HENT: Negative for ear pain, nosebleeds, congestion, sore throat, rhinorrhea, sneezing, trouble swallowing, dental problem, postnasal drip and sinus pressure.   Eyes: Negative for redness and itching.  Respiratory: Negative for cough, chest tightness, shortness of breath and wheezing.   Cardiovascular: Negative for palpitations and leg swelling.  Gastrointestinal: Negative for nausea and vomiting.  Genitourinary: Negative for dysuria.  Musculoskeletal: Negative for joint swelling.  Skin: Negative for rash.  Neurological: Negative for headaches.  Hematological: Does not bruise/bleed easily.  Psychiatric/Behavioral: Negative for dysphoric mood. The patient is not nervous/anxious.        Objective:   Physical Exam Constitutional:  Obese female, no acute distress  HENT:  Nares patent without discharge, mild deviation to the  left  Oropharynx without exudate, palate and uvula are very long and thickened.  Eyes:  Perrla, eomi, no scleral icterus  Neck:  No JVD, no TMG  Cardiovascular:  Normal rate, regular rhythm, no rubs or gallops.  No murmurs        Intact distal pulses  Pulmonary :  Normal breath sounds, no stridor or respiratory distress   No rales, rhonchi, or wheezing  Abdominal:  Soft, nondistended, bowel sounds present.  No tenderness noted.   Musculoskeletal:  mild lower extremity edema noted.  Lymph Nodes:  No cervical  lymphadenopathy noted  Skin:  No cyanosis noted  Neurologic:  Appears sleepy, but appropriate, moves all 4 extremities without obvious deficit.         Assessment & Plan:

## 2013-03-19 ENCOUNTER — Telehealth: Payer: Self-pay | Admitting: Pulmonary Disease

## 2013-03-19 NOTE — Telephone Encounter (Signed)
General 03/18/2013 9:10 AM COBB, RHONDA J        Note    Pt will have to use [facility] for cpap supplies going forward. [facility] is not in network with pt's current ins plan. LMOAM for Melissa with [facility] that pt did receive her CPAP from [facility] that [facility] would need to check machine to ensure that it is working properly. That service will need to be provided by the DME who issued the patients machine. Order faxed to [facility] to arrange cpap mask. Rhonda J [employer] and spoke with patient and she is aware of the above. Rhonda J Cobb  -----  i called and spoke with Melissa who has been working on this. She stated she will call her office about this. She needed nothing further. Will sign off message

## 2013-03-25 ENCOUNTER — Ambulatory Visit: Payer: Medicare HMO | Admitting: Internal Medicine

## 2013-04-06 ENCOUNTER — Ambulatory Visit: Payer: Medicare HMO | Admitting: Internal Medicine

## 2013-04-06 DIAGNOSIS — Z0289 Encounter for other administrative examinations: Secondary | ICD-10-CM

## 2013-05-08 ENCOUNTER — Ambulatory Visit: Payer: Medicare HMO | Admitting: Pulmonary Disease

## 2013-05-13 ENCOUNTER — Encounter: Payer: Self-pay | Admitting: Pulmonary Disease

## 2013-07-15 ENCOUNTER — Encounter: Payer: Self-pay | Admitting: Internal Medicine

## 2013-07-15 ENCOUNTER — Ambulatory Visit (INDEPENDENT_AMBULATORY_CARE_PROVIDER_SITE_OTHER): Payer: Medicare HMO | Admitting: Internal Medicine

## 2013-07-15 VITALS — BP 162/80 | HR 57 | Temp 98.4°F | Wt 219.0 lb

## 2013-07-15 DIAGNOSIS — J309 Allergic rhinitis, unspecified: Secondary | ICD-10-CM

## 2013-07-15 DIAGNOSIS — Z23 Encounter for immunization: Secondary | ICD-10-CM

## 2013-07-15 DIAGNOSIS — I10 Essential (primary) hypertension: Secondary | ICD-10-CM

## 2013-07-15 DIAGNOSIS — J329 Chronic sinusitis, unspecified: Secondary | ICD-10-CM

## 2013-07-15 MED ORDER — FLUTICASONE PROPIONATE 50 MCG/ACT NA SUSP: 2.0000 | Freq: Every day | NASAL | Status: DC

## 2013-07-15 MED ORDER — PROMETHAZINE-DM 6.25-15 MG/5ML PO SYRP: 5.0000 mL | ORAL_SOLUTION | Freq: Four times a day (QID) | ORAL | Status: DC | PRN

## 2013-07-15 MED ORDER — AMOXICILLIN-POT CLAVULANATE 875-125 MG PO TABS: 1.0000 | ORAL_TABLET | Freq: Two times a day (BID) | ORAL | Status: AC

## 2013-07-15 MED ORDER — CETIRIZINE HCL 10 MG PO TABS: 10.0000 mg | ORAL_TABLET | Freq: Every day | ORAL | Status: DC

## 2013-07-15 NOTE — Progress Notes (Signed)
Pre-visit discussion using our clinic review tool. No additional management support is needed unless otherwise documented below in the visit note.  

## 2013-07-15 NOTE — Patient Instructions (Signed)
It was good to see you today.  Augemntin antibiotics 2x/day x 1 week, Continue Zyrtec daily, begin Flonase nose spray every day for next 30 days and prescription cough syrup at bedtime for one week, then as needed -  Your prescription(s) have been submitted to your pharmacy. Please take as directed and contact our office if you believe you are having problem(s) with the medication(s).  Alternate between ibuprofen and tylenol for aches, pain and fever symptoms as discussed  Hydrate, rest and call if worse or unimproved  Take your blood pressure medications every day as instructed and call if systolic pressure remains over 140!  followup in 3 months for blood pressure recheck, call sooner if problems

## 2013-07-15 NOTE — Progress Notes (Signed)
Subjective:    Patient ID: Anne Barnes, female    DOB: 1946/09/14, 67 y.o.   MRN: [id number]  Sinusitis Associated symptoms include coughing and headaches. Pertinent negatives include no shortness of breath.    Complains of frontal sinus headache and pain Onset 2 months ago, intermittent but progressively worse now daily symptoms Associated with diffuse ache across head, nocturnal coughing, nasal congestion and sneezing Not improved with over-the-counter analgesics and 0 to Intermittent fever  Reports noncompliance with hypertensive medications in the past week Denies unilateral weakness or vision changes Exacerbated by use of decongestant medications for allergy and sinuses as above  Past Medical History  Diagnosis Date  . ALLERGIC RHINITIS   . DIVERTICULOSIS, COLON   . GERD   . HYPERTENSION   . OBSTRUCTIVE SLEEP APNEA 12/2008 dx    noncompliant with CPAP qhs  . Sciatica     right side     Review of Systems  Constitutional: Positive for fatigue. Negative for appetite change and unexpected weight change.  Respiratory: Positive for cough. Negative for shortness of breath and wheezing.   Cardiovascular: Negative for chest pain and leg swelling.  Neurological: Positive for headaches. Negative for facial asymmetry, weakness and light-headedness.       Objective:   Physical Exam BP 162/80  Pulse 57  Temp(Src) 98.4 F (36.9 C) (Oral)  Wt 219 lb (99.338 kg)  BMI 42.77 kg/m2  SpO2 97% Wt Readings from Last 3 Encounters:  07/15/13 219 lb (99.338 kg)  03/17/13 220 lb (99.791 kg)  02/26/13 216 lb 3.2 oz (98.068 kg)   Constitutional: She appears well-developed and well-nourished. No distress.  HENT: Head: Normocephalic and atraumatic. Moderate tenderness to frontal sinuses and maxillary sinuses bilaterally. Ears: B TMs ok, no erythema or effusion; Nose: bilateral turbinate pallor with thick mucus discharge. No exudate. No ulceration. Mouth/Throat:mild to moderate clear  PND. Oropharynx is clear and moist. No oropharyngeal exudate.  Eyes: Conjunctivae and EOM are normal. Pupils are equal, round, and reactive to light. No scleral icterus.  Neck: Normal range of motion. Neck supple. No JVD present. No thyromegaly present.  Cardiovascular: Normal rate, regular rhythm and normal heart sounds.  No murmur heard. No BLE edema. Pulmonary/Chest: Effort normal and breath sounds normal. No respiratory distress. She has no wheezes.  Neurological: She is alert and oriented to person, place, and time. No cranial nerve deficit. Coordination, balance, strength, speech and gait are normal.  Psychiatric: She has a normal mood and affect. Her behavior is normal. Judgment and thought content normal.   Lab Results  Component Value Date   WBC 8.8 02/26/2013   HGB 13.8 02/26/2013   HCT 41.5 02/26/2013   PLT 231.0 02/26/2013   GLUCOSE 85 02/26/2013   CHOL 177 02/26/2013   TRIG 37.0 02/26/2013   HDL 41.20 02/26/2013   LDLCALC 128* 02/26/2013   ALT 19 02/26/2013   AST 14 02/26/2013   NA 142 02/26/2013   K 4.3 02/26/2013   CL 104 02/26/2013   CREATININE 0.7 02/26/2013   BUN 18 02/26/2013   CO2 30 02/26/2013   TSH 1.18 02/26/2013   HGBA1C  Value: 5.5 (NOTE) The ADA recommends the following therapeutic goal for glycemic control related to Hgb A1c measurement: Goal of therapy: <6.5 Hgb A1c  Reference: American Diabetes Association: Clinical Practice Recommendations 2010, Diabetes Care, 2010, 33: (Suppl  1). 12/15/2009       Assessment & Plan:   Sinus headache - suspect allergic, but given symptoms ongoing > 1  month, will treat for potential infection within the chronic congestion - augmentin bid  Also, add nasal steroid, oral antihistamine daily and promethazine DM each bedtime x1 week then as needed  Also See problem list. Medications and labs reviewed today.

## 2013-07-15 NOTE — Assessment & Plan Note (Signed)
Pt to resume prior ARB, call if SBP>140 Followup in 4 weeks, patient call sooner if remains uncontrolled -   BP Readings from Last 3 Encounters:  07/15/13 162/80  03/17/13 150/82  02/26/13 152/90

## 2013-08-12 ENCOUNTER — Ambulatory Visit: Payer: Medicare HMO | Admitting: Internal Medicine

## 2013-08-12 DIAGNOSIS — Z0289 Encounter for other administrative examinations: Secondary | ICD-10-CM

## 2013-08-15 ENCOUNTER — Emergency Department (HOSPITAL_BASED_OUTPATIENT_CLINIC_OR_DEPARTMENT_OTHER)
Admission: EM | Admit: 2013-08-15 | Discharge: 2013-08-15 | Disposition: A | Discharge status: Home / Self Care | Payer: Medicare HMO | Attending: Emergency Medicine | Admitting: Emergency Medicine

## 2013-08-15 ENCOUNTER — Encounter (HOSPITAL_BASED_OUTPATIENT_CLINIC_OR_DEPARTMENT_OTHER): Payer: Self-pay | Admitting: Emergency Medicine

## 2013-08-15 DIAGNOSIS — K219 Gastro-esophageal reflux disease without esophagitis: Secondary | ICD-10-CM | POA: Insufficient documentation

## 2013-08-15 DIAGNOSIS — Z79899 Other long term (current) drug therapy: Secondary | ICD-10-CM | POA: Insufficient documentation

## 2013-08-15 DIAGNOSIS — J45901 Unspecified asthma with (acute) exacerbation: Secondary | ICD-10-CM | POA: Insufficient documentation

## 2013-08-15 DIAGNOSIS — I10 Essential (primary) hypertension: Secondary | ICD-10-CM | POA: Insufficient documentation

## 2013-08-15 DIAGNOSIS — M5430 Sciatica, unspecified side: Secondary | ICD-10-CM | POA: Insufficient documentation

## 2013-08-15 DIAGNOSIS — J321 Chronic frontal sinusitis: Secondary | ICD-10-CM | POA: Insufficient documentation

## 2013-08-15 DIAGNOSIS — Z8669 Personal history of other diseases of the nervous system and sense organs: Secondary | ICD-10-CM | POA: Insufficient documentation

## 2013-08-15 MED ORDER — PREDNISONE 20 MG PO TABS: 40.0000 mg | ORAL_TABLET | Freq: Once | ORAL | Status: DC

## 2013-08-15 MED ORDER — HYDROCODONE-ACETAMINOPHEN 5-300 MG PO TABS: 1.0000 | ORAL_TABLET | ORAL | Status: DC | PRN

## 2013-08-15 MED ORDER — PREDNISONE 50 MG PO TABS
60.0000 mg | ORAL_TABLET | Freq: Once | ORAL | Status: AC
  Administered 2013-08-15: 60 mg via ORAL
  Filled 2013-08-15 (×2): qty 1

## 2013-08-15 MED ORDER — HYDROCODONE-ACETAMINOPHEN 5-325 MG PO TABS
1.0000 | ORAL_TABLET | Freq: Once | ORAL | Status: AC
  Administered 2013-08-15: 1 via ORAL
  Filled 2013-08-15: qty 1

## 2013-08-15 NOTE — ED Notes (Signed)
Patient here with ongoing facial pain, headache, sinus congestion. States has finished antibiotics for sinusitis but feeling worse. Chills with same.

## 2013-08-15 NOTE — ED Provider Notes (Signed)
Medical screening examination/treatment/procedure(s) were performed by non-physician practitioner and as supervising physician I was immediately available for consultation/collaboration.  EKG Interpretation   None         Rilya Longo B. Calandria Mullings, MD 08/15/13 1740 

## 2013-08-15 NOTE — ED Provider Notes (Signed)
CSN: [id number]     Arrival date & time 08/15/13  1433 History   First MD Initiated Contact with Patient 08/15/13 1442     Chief Complaint  Patient presents with  . Nasal Congestion   (Consider location/radiation/quality/duration/timing/severity/associated sxs/prior Treatment) Patient is a 67 y.o. female presenting with URI. The history is provided by the patient. No language interpreter was used.  URI Presenting symptoms: congestion, cough, facial pain and rhinorrhea   Presenting symptoms: no ear pain, no fatigue, no fever and no sore throat   Associated symptoms: headaches and sinus pain   Associated symptoms: no neck pain and no sneezing   Associated symptoms comment:  PT is a 67 year old female with a PMH of HTN, asthma, OSA, allergic rhinitis and diverticulosis who presents today  with complaint of sinus pressure and drainage X 1 week.  PT has a history of recurrent sinus infection and is seen by Dr. Felicity Coyer.  Pt had an appointment this week with her doctor that she canceled due to "feel better". Since that time her symptoms have gotten worse. Increased pressure and pain over maxillary sinus, with yellow discharge from nasal passage and with cough that yields yellow sputum.  She denies fever, sore throat, chest pain, SOB, and GI symptoms at this time. She has been taking OTC antitussives, and tylenol which help symptoms but do not provide adequate relieve.    Past Medical History  Diagnosis Date  . ALLERGIC RHINITIS   . DIVERTICULOSIS, COLON   . GERD   . HYPERTENSION   . OBSTRUCTIVE SLEEP APNEA 12/2008 dx    noncompliant with CPAP qhs  . Sciatica     right side   Past Surgical History  Procedure Laterality Date  . Abdominal hysterectomy  1986  . Breast surgery  1973    Breast biopsy  . Umbilical hernia repair     Family History  Problem Relation Age of Onset  . Arthritis Mother   . Arthritis Father   . Alcohol abuse Other     parent   History  Substance Use Topics  .  Smoking status: Never Smoker   . Smokeless tobacco: Not on file     Comment: Married, lives with spouse, dtr 2 g-kids.  . Alcohol Use: No     Comment: rare   OB History   Grav Para Term Preterm Abortions TAB SAB Ect Mult Living                 Review of Systems  Constitutional: Negative for fever and fatigue.  HENT: Positive for congestion, rhinorrhea and sinus pressure. Negative for ear pain, sneezing, sore throat and trouble swallowing.   Respiratory: Positive for cough.   Cardiovascular: Negative for chest pain.  Gastrointestinal: Negative for vomiting.  Musculoskeletal: Negative for neck pain.  Neurological: Positive for headaches.    Allergies  Codeine and Lisinopril-hydrochlorothiazide  Home Medications   Current Outpatient Rx  Name  Route  Sig  Dispense  Refill  . b complex vitamins tablet   Oral   Take 1 tablet by mouth daily.         . cetirizine (ZYRTEC) 10 MG tablet   Oral   Take 1 tablet (10 mg total) by mouth daily.   30 tablet   11   . Cholecalciferol (VITAMIN D) 2000 UNITS CAPS   Oral   Take by mouth daily.          Marland Kitchen FLUoxetine (PROZAC) 10 MG tablet   Oral  Take 1 tablet (10 mg total) by mouth daily.   30 tablet   3   . fluticasone (FLONASE) 50 MCG/ACT nasal spray   Nasal   Place 2 sprays into the nose daily.   16 g   2   . losartan (COZAAR) 100 MG tablet   Oral   Take 1 tablet (100 mg total) by mouth daily.   30 tablet   3   . Multiple Vitamin (MULTIVITAMIN) tablet   Oral   Take 1 tablet by mouth daily.           . Omega 3-6-9 Fatty Acids (OMEGA 3-6-9 COMPLEX PO)   Oral   Take 1 capsule by mouth daily.           Marland Kitchen omeprazole (PRILOSEC) 20 MG capsule   Oral   Take 1 capsule (20 mg total) by mouth 2 (two) times daily as needed. For acid reflux   60 capsule   3   . promethazine-dextromethorphan (PROMETHAZINE-DM) 6.25-15 MG/5ML syrup   Oral   Take 5 mLs by mouth 4 (four) times daily as needed for cough.   180 mL   0     BP 183/80  Pulse 85  Temp(Src) 98.5 F (36.9 C) (Oral)  Resp 20  Ht 5\' 1"  (1.549 m)  Wt 220 lb (99.791 kg)  BMI 41.59 kg/m2  SpO2 97% Physical Exam  Constitutional: She is oriented to person, place, and time. She appears well-developed and well-nourished.  HENT:  Head: Normocephalic.    Right Ear: External ear normal.  Left Ear: External ear normal.  Nose: Mucosal edema present.  Mouth/Throat: Oropharynx is clear and moist.  Pain with palpation of maxillary sinus.   Eyes: Conjunctivae and EOM are normal. Pupils are equal, round, and reactive to light.  Neck: Normal range of motion. Neck supple.  Cardiovascular: Normal rate, regular rhythm, normal heart sounds and intact distal pulses.  Exam reveals no gallop and no friction rub.   No murmur heard. Pulmonary/Chest: Effort normal. No respiratory distress. She has wheezes.  Mild expiratory wheezing.  Abdominal: Soft. Bowel sounds are normal. There is no tenderness.  Musculoskeletal: Normal range of motion.  Neurological: She is alert and oriented to person, place, and time.  Skin: Skin is warm and dry.  Psychiatric: She has a normal mood and affect. Her behavior is normal. Judgment and thought content normal.    ED Course  Procedures (including critical care time) Labs Review Labs Reviewed - No data to display Imaging Review No results found.  EKG Interpretation   None       MDM  No diagnosis found. 1. Sinusitis, chronic  Abx regimen 3 weeks before without significant improvement or change to symptoms that are recurrent regularly throughout the year. No fever. Doubt bacterial sinus infection. Will treat symptomatically and encourage PCP follow up this week.     Arnoldo Hooker, PA-C 08/15/13 1643

## 2013-08-31 ENCOUNTER — Encounter: Payer: Self-pay | Admitting: Internal Medicine

## 2013-08-31 ENCOUNTER — Ambulatory Visit (INDEPENDENT_AMBULATORY_CARE_PROVIDER_SITE_OTHER): Payer: Medicare HMO | Admitting: Internal Medicine

## 2013-08-31 ENCOUNTER — Encounter: Payer: Self-pay | Admitting: *Deleted

## 2013-08-31 VITALS — BP 142/80 | HR 55 | Temp 98.2°F | Ht 60.0 in | Wt 224.0 lb

## 2013-08-31 DIAGNOSIS — I10 Essential (primary) hypertension: Secondary | ICD-10-CM

## 2013-08-31 DIAGNOSIS — J329 Chronic sinusitis, unspecified: Secondary | ICD-10-CM

## 2013-08-31 MED ORDER — PHENYLEPHRINE HCL 10 MG PO TABS: 10.0000 mg | ORAL_TABLET | ORAL | Status: DC | PRN

## 2013-08-31 NOTE — Patient Instructions (Addendum)
It was good to see you today.  We have reviewed your prior records including labs and tests today  Medications reviewed and updated Take Sudafed PE every 4 hours as needed for sinus congestion and pain in addition to ongoing nasal spray and blood pressure medications  Your prescription(s) have been submitted to your pharmacy. Please take as directed and contact our office if you believe you are having problem(s) with the medication(s).  Will refer for CT sinuses to evaluate sinus anatomy and for ENT specialty evaluation of your sinus problem. My office will call regarding these appointments. You'll be contacted regarding results once reviewed  Work excuse note for today provided as requested  Followup in 6 months for blood pressure and weight check, please call sooner if problems Sinus Headache A sinus headache happens when your sinuses become clogged or puffy (swollen). Sinus headaches can be mild or severe. HOME CARE  Take your medicines (antibiotics) as told. Finish them even if you start to feel better.  Only take medicine as told by your doctor.  Use a nose spray if you feel stuffed up (congested). GET HELP RIGHT AWAY IF:  You have a fever.  You have trouble seeing.  You suddenly have pain in your face or head.  You start to twitch or shake (seizure).  You are confused.  You get headaches more than once a week.  Light or sound bothers you.  You feel sick to your stomach (nauseous) or throw up (vomit).  Your headaches do not get better with treatment. MAKE SURE YOU:  Understand these instructions.  Will watch your condition.  Will get help right away if you are not doing well or get worse. Document Released: 01/10/2011 Document Revised: 12/03/2011 Document Reviewed: 01/10/2011 [facility] Patient Information 2014 [place], [place]. Hypertension Hypertension is another name for high blood pressure. High blood pressure may mean that your heart needs to work harder to  pump blood. Blood pressure consists of two numbers, which includes a higher number over a lower number (example: 110/72). HOME CARE   Make lifestyle changes as told by your doctor. This may include weight loss and exercise.  Take your blood pressure medicine every day.  Limit how much salt you use.  Stop smoking if you smoke.  Do not use drugs.  Talk to your doctor if you are using decongestants or birth control pills. These medicines might make blood pressure higher.  Females should not drink more than 1 alcoholic drink per day. Males should not drink more than 2 alcoholic drinks per day.  See your doctor as told. GET HELP RIGHT AWAY IF:   You have a blood pressure reading with a top number of 180 or higher.  You get a very bad headache.  You get blurred or changing vision.  You feel confused.  You feel weak, numb, or faint.  You get chest or belly (abdominal) pain.  You throw up (vomit).  You cannot breathe very well. MAKE SURE YOU:   Understand these instructions.  Will watch your condition.  Will get help right away if you are not doing well or get worse. Document Released: 02/27/2008 Document Revised: 12/03/2011 Document Reviewed: 02/27/2008 [facility] Patient Information 2014 [place], [place].

## 2013-08-31 NOTE — Progress Notes (Signed)
Pre-visit discussion using our clinic review tool. No additional management support is needed unless otherwise documented below in the visit note.  

## 2013-08-31 NOTE — Progress Notes (Signed)
Subjective:    Patient ID: Anne Barnes, female    DOB: 1946-03-17, 67 y.o.   MRN: [id number]  Sinusitis This is a chronic problem. The current episode started more than 1 month ago. The problem has been waxing and waning since onset. There has been no fever. The pain is moderate. Associated symptoms include congestion, coughing (which precipitates HA), ear pain (L>R), headaches, a hoarse voice, sinus pressure and a sore throat (intermittent). Pertinent negatives include no chills, neck pain, shortness of breath or swollen glands. Past treatments include saline sprays and antibiotics (antihistamine and nasal steroids). The treatment provided mild relief.     Also reviewed chronic medical issues -  OSA (noncompliant with CPAP due to poor fitting mask) - in need of followup for new equipment  HTN - frequently noncompliant with meds - no headache, edema or chest pain  Osteoarthritis -no joint swelling, takes Tylenol as needed for same with ineffective relief, requests refill on Norco  Past Medical History  Diagnosis Date  . ALLERGIC RHINITIS   . DIVERTICULOSIS, COLON   . GERD   . HYPERTENSION   . OBSTRUCTIVE SLEEP APNEA 12/2008 dx    noncompliant with CPAP qhs  . Sciatica     right side    Review of Systems  Constitutional: Negative for chills.  HENT: Positive for congestion, ear pain (L>R), hoarse voice, sinus pressure and sore throat (intermittent).   Respiratory: Positive for cough (which precipitates HA). Negative for shortness of breath.   Musculoskeletal: Negative for neck pain.  Neurological: Positive for headaches.        Objective:   Physical Exam BP 142/80  Pulse 55  Temp(Src) 98.2 F (36.8 C) (Oral)  Ht 5' (1.524 m)  Wt 224 lb (101.606 kg)  BMI 43.75 kg/m2  SpO2 97% Wt Readings from Last 3 Encounters:  08/31/13 224 lb (101.606 kg)  08/15/13 220 lb (99.791 kg)  07/15/13 219 lb (99.338 kg)   Constitutional: She is obese, but appears well-developed and  well-nourished. No distress.  HENT: Head: Normocephalic and atraumatic. Sinus mildly tender to palp frontal and maxillary. Ears: B TMs ok, no erythema or effusion; Nose: mild turbinate swelling. No discharge noted Mouth/Throat: Oropharynx is clear and moist. No oropharyngeal exudate.  Eyes: Conjunctivae and EOM are normal. Pupils are equal, round, and reactive to light. No scleral icterus.  Neck: Thick; Normal range of motion. Neck supple. No JVD present. No thyromegaly present.  Cardiovascular: Normal rate, regular rhythm and normal heart sounds.  No murmur heard. No BLE edema. Pulmonary/Chest: Effort normal and breath sounds normal. No respiratory distress. She has no wheezes.  Neurological: She is alert and oriented to person, place, and time. No cranial nerve deficit. Coordination, balance, strength, speech and gait are normal.  Skin: Skin is warm and dry. No rash noted. No erythema.    Lab Results  Component Value Date   WBC 8.8 02/26/2013   HGB 13.8 02/26/2013   HCT 41.5 02/26/2013   PLT 231.0 02/26/2013   GLUCOSE 85 02/26/2013   CHOL 177 02/26/2013   TRIG 37.0 02/26/2013   HDL 41.20 02/26/2013   LDLCALC 128* 02/26/2013   ALT 19 02/26/2013   AST 14 02/26/2013   NA 142 02/26/2013   K 4.3 02/26/2013   CL 104 02/26/2013   CREATININE 0.7 02/26/2013   BUN 18 02/26/2013   CO2 30 02/26/2013   TSH 1.18 02/26/2013   HGBA1C  Value: 5.5 (NOTE) The ADA recommends the following therapeutic goal for glycemic  control related to Hgb A1c measurement: Goal of therapy: <6.5 Hgb A1c  Reference: American Diabetes Association: Clinical Practice Recommendations 2010, Diabetes Care, 2010, 33: (Suppl  1). 12/15/2009        Assessment & Plan:   Chronic sinusitis - Eustachian tube dysfunction, left > right side  Treatment with Augmentin 6 weeks ago and ER visit for same 3 weeks ago reviewed  Symptoms unimproved with nasal saline and daily nasal steroid + antihistamine  Check CT sinus and refer to ENT Add low-dose decongestant,  since blood pressure now better controlled

## 2013-08-31 NOTE — Assessment & Plan Note (Signed)
  BP Readings from Last 3 Encounters:  08/31/13 142/80  08/15/13 183/80  07/15/13 162/80   Improved with regular ARB use The current medical regimen is effective;  continue present plan and medications.

## 2013-09-03 ENCOUNTER — Ambulatory Visit (INDEPENDENT_AMBULATORY_CARE_PROVIDER_SITE_OTHER)
Admission: RE | Admit: 2013-09-03 | Discharge: 2013-09-03 | Disposition: A | Discharge status: Home / Self Care | Payer: Medicare HMO | Source: Ambulatory Visit | Attending: Internal Medicine | Admitting: Internal Medicine

## 2013-09-03 DIAGNOSIS — J329 Chronic sinusitis, unspecified: Secondary | ICD-10-CM

## 2013-09-09 ENCOUNTER — Encounter: Payer: Self-pay | Admitting: Internal Medicine

## 2013-09-09 ENCOUNTER — Ambulatory Visit (INDEPENDENT_AMBULATORY_CARE_PROVIDER_SITE_OTHER): Payer: Medicare HMO | Admitting: Internal Medicine

## 2013-09-09 VITALS — BP 148/78 | HR 68 | Temp 98.1°F | Wt 223.2 lb

## 2013-09-09 DIAGNOSIS — R51 Headache: Secondary | ICD-10-CM

## 2013-09-09 DIAGNOSIS — G44009 Cluster headache syndrome, unspecified, not intractable: Secondary | ICD-10-CM

## 2013-09-09 DIAGNOSIS — J309 Allergic rhinitis, unspecified: Secondary | ICD-10-CM

## 2013-09-09 NOTE — Progress Notes (Signed)
Subjective:    Patient ID: Anne Barnes, female    DOB: 01/14/1946, 67 y.o.   MRN: [id number]  HPI  Complains of continuing left-sided headache Ongoing greater than 3 months Episodes occur daily lasting up to 5 minutes per episode Associated with sneezing, tearing and runny nose Pain is intense, 9/10 Pain radiates to her left ear and left neck  Past Medical History  Diagnosis Date  . ALLERGIC RHINITIS   . DIVERTICULOSIS, COLON   . GERD   . HYPERTENSION   . OBSTRUCTIVE SLEEP APNEA 12/2008 dx    noncompliant with CPAP qhs  . Sciatica     right side     Review of Systems  Constitutional: Positive for fatigue. Negative for fever and unexpected weight change.  HENT: Positive for sneezing.   Respiratory: Positive for cough. Negative for shortness of breath.   Cardiovascular: Negative for chest pain and leg swelling.  Neurological: Positive for headaches. Negative for dizziness, tremors, facial asymmetry and weakness.       Objective:   Physical Exam BP 148/78  Pulse 68  Temp(Src) 98.1 F (36.7 C) (Oral)  Wt 223 lb 3.2 oz (101.243 kg)  SpO2 97% Constitutional: She appears well-developed and well-nourished. No distress.  HENT: Head: Normocephalic and atraumatic. Ears: B TMs ok, no erythema or effusion; Nose: Nose normal. Mouth/Throat: Oropharynx is clear and moist. No oropharyngeal exudate.  Eyes: Conjunctivae and EOM are normal. Pupils are equal, round, and reactive to light. No scleral icterus.  Neck: Normal range of motion. Neck supple. No JVD present. No thyromegaly present.  Cardiovascular: Normal rate, regular rhythm and normal heart sounds.  No murmur heard. No BLE edema. Pulmonary/Chest: Effort normal and breath sounds normal. No respiratory distress. She has no wheezes.  Neurological: She is alert and oriented to person, place, and time. No cranial nerve deficit. Coordination, balance, strength, speech and gait are normal.  Skin: Skin is warm and dry. No rash  noted. No erythema.  Psychiatric: She has a normal mood and affect. Her behavior is normal. Judgment and thought content normal.   Lab Results  Component Value Date   WBC 8.8 02/26/2013   HGB 13.8 02/26/2013   HCT 41.5 02/26/2013   PLT 231.0 02/26/2013   GLUCOSE 85 02/26/2013   CHOL 177 02/26/2013   TRIG 37.0 02/26/2013   HDL 41.20 02/26/2013   LDLCALC 128* 02/26/2013   ALT 19 02/26/2013   AST 14 02/26/2013   NA 142 02/26/2013   K 4.3 02/26/2013   CL 104 02/26/2013   CREATININE 0.7 02/26/2013   BUN 18 02/26/2013   CO2 30 02/26/2013   TSH 1.18 02/26/2013   HGBA1C  Value: 5.5 (NOTE) The ADA recommends the following therapeutic goal for glycemic control related to Hgb A1c measurement: Goal of therapy: <6.5 Hgb A1c  Reference: American Diabetes Association: Clinical Practice Recommendations 2010, Diabetes Care, 2010, 33: (Suppl  1). 12/15/2009     Ct Maxillofacial Ltd Wo Cm  09/03/2013   CLINICAL DATA:  Chronic sinus pressure for approximately 10 weeks. Productive cough.  EXAM: CT PARANASAL SINUS LIMITED WITHOUT CONTRAST  TECHNIQUE: Non-contiguous multidetector CT images of the paranasal sinuses were obtained in a single plane without contrast.  COMPARISON:  Head CT 11/1908  FINDINGS: The visualized portions of the frontal, maxillary, and ethmoid air cells are clear. Minimal retained secretion is noted in the left sphenoid sinus. Ostiomeatal complexes appear patent bilaterally. There is mild asymmetric enlargement of the right-sided nasal turbinates. No air-fluid levels  are identified. There is trace leftward nasal septal deviation and spurring. The visualized portions of the orbits and brain are unremarkable.  IMPRESSION: Essentially clear paranasal sinuses.   Electronically Signed   By: Sebastian Ache   On: 09/03/2013 09:17     Assessment & Plan:   L frontal headache - radiating to L ear Ongoing daily, 3-5x/day for >3 months - episodes last or less Triggered by cough and runny nose with PND and tearing Not improved  with tx for sinus and recent sinus CT negative (reviewed)  Check MRI and consider ?cluster HA Also refer neuro

## 2013-09-09 NOTE — Patient Instructions (Addendum)
It was good to see you today.  We have reviewed your prior records including labs and tests today  Medications reviewed and updated, no change recommended   Will refer for MRI brain to evaluate brain anatomy and for neurology specialty evaluation of your headache problem. My office will call regarding these appointments. You'll be contacted regarding results once reviewed  Followup in 6 months for blood pressure and weight check, please call sooner if problems  Headaches, Frequently Asked Questions MIGRAINE HEADACHES Q: What is migraine? What causes it? How can I treat it? A: Generally, migraine headaches begin as a dull ache. Then they develop into a constant, throbbing, and pulsating pain. You may experience pain at the temples. You may experience pain at the front or back of one or both sides of the head. The pain is usually accompanied by a combination of:  Nausea.  Vomiting.  Sensitivity to light and noise. Some people (about 15%) experience an aura (see below) before an attack. The cause of migraine is believed to be chemical reactions in the brain. Treatment for migraine may include over-the-counter or prescription medications. It may also include self-help techniques. These include relaxation training and biofeedback.  Q: What is an aura? A: About 15% of people with migraine get an "aura". This is a sign of neurological symptoms that occur before a migraine headache. You may see wavy or jagged lines, dots, or flashing lights. You might experience tunnel vision or blind spots in one or both eyes. The aura can include visual or auditory hallucinations (something imagined). It may include disruptions in smell (such as strange odors), taste or touch. Other symptoms include:  Numbness.  A "pins and needles" sensation.  Difficulty in recalling or speaking the correct word. These neurological events may last as long as 60 minutes. These symptoms will fade as the headache begins. Q:  What is a trigger? A: Certain physical or environmental factors can lead to or "trigger" a migraine. These include:  Foods.  Hormonal changes.  Weather.  Stress. It is important to remember that triggers are different for everyone. To help prevent migraine attacks, you need to figure out which triggers affect you. Keep a headache diary. This is a good way to track triggers. The diary will help you talk to your healthcare professional about your condition. Q: Does weather affect migraines? A: Bright sunshine, hot, humid conditions, and drastic changes in barometric pressure may lead to, or "trigger," a migraine attack in some people. But studies have shown that weather does not act as a trigger for everyone with migraines. Q: What is the link between migraine and hormones? A: Hormones start and regulate many of your body's functions. Hormones keep your body in balance within a constantly changing environment. The levels of hormones in your body are unbalanced at times. Examples are during menstruation, pregnancy, or menopause. That can lead to a migraine attack. In fact, about three quarters of all women with migraine report that their attacks are related to the menstrual cycle.  Q: Is there an increased risk of stroke for migraine sufferers? A: The likelihood of a migraine attack causing a stroke is very remote. That is not to say that migraine sufferers cannot have a stroke associated with their migraines. In persons under age 81, the most common associated factor for stroke is migraine headache. But over the course of a person's normal life span, the occurrence of migraine headache may actually be associated with a reduced risk of dying from cerebrovascular  disease due to stroke.  Q: What are acute medications for migraine? A: Acute medications are used to treat the pain of the headache after it has started. Examples over-the-counter medications, NSAIDs, ergots, and triptans.  Q: What are the  triptans? A: Triptans are the newest class of abortive medications. They are specifically targeted to treat migraine. Triptans are vasoconstrictors. They moderate some chemical reactions in the brain. The triptans work on receptors in your brain. Triptans help to restore the balance of a neurotransmitter called serotonin. Fluctuations in levels of serotonin are thought to be a main cause of migraine.  Q: Are over-the-counter medications for migraine effective? A: Over-the-counter, or "OTC," medications may be effective in relieving mild to moderate pain and associated symptoms of migraine. But you should see your caregiver before beginning any treatment regimen for migraine.  Q: What are preventive medications for migraine? A: Preventive medications for migraine are sometimes referred to as "prophylactic" treatments. They are used to reduce the frequency, severity, and length of migraine attacks. Examples of preventive medications include antiepileptic medications, antidepressants, beta-blockers, calcium channel blockers, and NSAIDs (nonsteroidal anti-inflammatory drugs). Q: Why are anticonvulsants used to treat migraine? A: During the past few years, there has been an increased interest in antiepileptic drugs for the prevention of migraine. They are sometimes referred to as "anticonvulsants". Both epilepsy and migraine may be caused by similar reactions in the brain.  Q: Why are antidepressants used to treat migraine? A: Antidepressants are typically used to treat people with depression. They may reduce migraine frequency by regulating chemical levels, such as serotonin, in the brain.  Q: What alternative therapies are used to treat migraine? A: The term "alternative therapies" is often used to describe treatments considered outside the scope of conventional Western medicine. Examples of alternative therapy include acupuncture, acupressure, and yoga. Another common alternative treatment is herbal  therapy. Some herbs are believed to relieve headache pain. Always discuss alternative therapies with your caregiver before proceeding. Some herbal products contain arsenic and other toxins. TENSION HEADACHES Q: What is a tension-type headache? What causes it? How can I treat it? A: Tension-type headaches occur randomly. They are often the result of temporary stress, anxiety, fatigue, or anger. Symptoms include soreness in your temples, a tightening band-like sensation around your head (a "vice-like" ache). Symptoms can also include a pulling feeling, pressure sensations, and contracting head and neck muscles. The headache begins in your forehead, temples, or the back of your head and neck. Treatment for tension-type headache may include over-the-counter or prescription medications. Treatment may also include self-help techniques such as relaxation training and biofeedback. CLUSTER HEADACHES Q: What is a cluster headache? What causes it? How can I treat it? A: Cluster headache gets its name because the attacks come in groups. The pain arrives with little, if any, warning. It is usually on one side of the head. A tearing or bloodshot eye and a runny nose on the same side of the headache may also accompany the pain. Cluster headaches are believed to be caused by chemical reactions in the brain. They have been described as the most severe and intense of any headache type. Treatment for cluster headache includes prescription medication and oxygen. SINUS HEADACHES Q: What is a sinus headache? What causes it? How can I treat it? A: When a cavity in the bones of the face and skull (a sinus) becomes inflamed, the inflammation will cause localized pain. This condition is usually the result of an allergic reaction, a tumor, or an  infection. If your headache is caused by a sinus blockage, such as an infection, you will probably have a fever. An x-ray will confirm a sinus blockage. Your caregiver's treatment might  include antibiotics for the infection, as well as antihistamines or decongestants.  REBOUND HEADACHES Q: What is a rebound headache? What causes it? How can I treat it? A: A pattern of taking acute headache medications too often can lead to a condition known as "rebound headache." A pattern of taking too much headache medication includes taking it more than 2 days per week or in excessive amounts. That means more than the label or a caregiver advises. With rebound headaches, your medications not only stop relieving pain, they actually begin to cause headaches. Doctors treat rebound headache by tapering the medication that is being overused. Sometimes your caregiver will gradually substitute a different type of treatment or medication. Stopping may be a challenge. Regularly overusing a medication increases the potential for serious side effects. Consult a caregiver if you regularly use headache medications more than 2 days per week or more than the label advises. ADDITIONAL QUESTIONS AND ANSWERS Q: What is biofeedback? A: Biofeedback is a self-help treatment. Biofeedback uses special equipment to monitor your body's involuntary physical responses. Biofeedback monitors:  Breathing.  Pulse.  Heart rate.  Temperature.  Muscle tension.  Brain activity. Biofeedback helps you refine and perfect your relaxation exercises. You learn to control the physical responses that are related to stress. Once the technique has been mastered, you do not need the equipment any more. Q: Are headaches hereditary? A: Four out of five (80%) of people that suffer report a family history of migraine. Scientists are not sure if this is genetic or a family predisposition. Despite the uncertainty, a child has a 50% chance of having migraine if one parent suffers. The child has a 75% chance if both parents suffer.  Q: Can children get headaches? A: By the time they reach high school, most young people have experienced some  type of headache. Many safe and effective approaches or medications can prevent a headache from occurring or stop it after it has begun.  Q: What type of doctor should I see to diagnose and treat my headache? A: Start with your primary caregiver. Discuss his or her experience and approach to headaches. Discuss methods of classification, diagnosis, and treatment. Your caregiver may decide to recommend you to a headache specialist, depending upon your symptoms or other physical conditions. Having diabetes, allergies, etc., may require a more comprehensive and inclusive approach to your headache. The National Headache Foundation will provide, upon request, a list of [facility] physician members in your state. Document Released: 12/01/2003 Document Revised: 12/03/2011 Document Reviewed: 05/10/2008 [facility] Patient Information 2014 [place], [place].

## 2013-09-09 NOTE — Progress Notes (Signed)
Pre-visit discussion using our clinic review tool. No additional management support is needed unless otherwise documented below in the visit note.  

## 2013-09-22 ENCOUNTER — Other Ambulatory Visit: Payer: Self-pay | Admitting: Internal Medicine

## 2013-09-30 ENCOUNTER — Ambulatory Visit
Admission: RE | Admit: 2013-09-30 | Discharge: 2013-09-30 | Disposition: A | Discharge status: Home / Self Care | Payer: Commercial Managed Care - HMO | Source: Ambulatory Visit | Attending: Internal Medicine | Admitting: Internal Medicine

## 2013-09-30 DIAGNOSIS — R519 Headache, unspecified: Secondary | ICD-10-CM

## 2013-09-30 DIAGNOSIS — G44009 Cluster headache syndrome, unspecified, not intractable: Secondary | ICD-10-CM

## 2013-09-30 DIAGNOSIS — R51 Headache: Principal | ICD-10-CM

## 2013-10-01 ENCOUNTER — Other Ambulatory Visit: Payer: Self-pay | Admitting: *Deleted

## 2013-10-01 MED ORDER — LOSARTAN POTASSIUM 100 MG PO TABS: ORAL_TABLET | ORAL | Status: DC

## 2013-10-01 NOTE — Telephone Encounter (Signed)
Called pt concerning her MRI gave results. Pt states pharmacy states they haven't received call back on her losartan. Inform pt on 09/22/13 refill was sent back to [facility] for # [phone] with 1 refill. Inform her will resend...Johny Chess

## 2013-10-06 ENCOUNTER — Encounter: Payer: Self-pay | Admitting: Neurology

## 2013-10-06 ENCOUNTER — Ambulatory Visit (INDEPENDENT_AMBULATORY_CARE_PROVIDER_SITE_OTHER): Payer: Medicare HMO | Admitting: Neurology

## 2013-10-06 VITALS — BP 136/84 | HR 80 | Resp 16 | Ht 60.0 in | Wt 225.1 lb

## 2013-10-06 DIAGNOSIS — Q054 Unspecified spina bifida with hydrocephalus: Secondary | ICD-10-CM

## 2013-10-06 DIAGNOSIS — R519 Headache, unspecified: Secondary | ICD-10-CM

## 2013-10-06 DIAGNOSIS — G4483 Primary cough headache: Secondary | ICD-10-CM

## 2013-10-06 DIAGNOSIS — R51 Headache: Secondary | ICD-10-CM

## 2013-10-06 DIAGNOSIS — IMO0002 Reserved for concepts with insufficient information to code with codable children: Secondary | ICD-10-CM

## 2013-10-06 MED ORDER — INDOMETHACIN 25 MG PO CAPS: ORAL_CAPSULE | ORAL | Status: DC

## 2013-10-06 NOTE — Progress Notes (Signed)
NEUROLOGY CONSULTATION NOTE  Anne Barnes MRN: [id number] DOB: August 04, 1946  Referring provider: Dr. Asa Lente Primary care provider: Dr. Asa Lente  Reason for consult:  headache  HISTORY OF PRESENT ILLNESS: Anne Barnes is a 68 year old right-handed woman with history of OSA, chronic sinusitis, and hypertension who presents for headache.  She is accompanied by her daughter.  Records and images were personally reviewed where available.    Onset:  4 months ago, following sinusitis. Location:  Bi-frontal, left temporal, left maxillary.  Also has separate aching pain behind left ear and down the left side of neck, not radiating to extremity. Quality:  Pounding, when severe but otherwise a pressure-like discomfort Intensity:  10/10 when triggered Associated symptoms:  No nausea, vomiting, photophobia, phonophobia, visual disturbance, visual loss, vertigo, fever.  Feels a little lightheaded Aura:  no Duration:  Just seconds, following a cough Frequency:  25 days out of the month Activity:  No change Triggers/exacerbating factors:  Triggered by cough.  Not usually by bending over.  Also has an underlying constant achiness Relieving factors:  Aleve Past abortive therapy:  Prednisone taper (ineffective) Past preventative therapy:  none Current abortive therapy:  Aleve (not often) Current preventative therapy:  none Sleep hygiene:  Uses CPAP.  Feels rested Stress/depression:  Some stress caring for her 57 year old grandson with behavioral problems Family history:  Son has headaches. Personal history of headache:  Did have "stress headache" when she was younger while having her children.  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  PAST MEDICAL HISTORY: Past Medical History  Diagnosis Date  . ALLERGIC RHINITIS   . DIVERTICULOSIS, COLON   . GERD   . HYPERTENSION   . OBSTRUCTIVE  SLEEP APNEA 12/2008 dx    noncompliant with CPAP qhs  . Sciatica     right side    PAST SURGICAL HISTORY: Past Surgical History  Procedure Laterality Date  . Abdominal hysterectomy  1986  . Breast surgery  1973    Breast biopsy  . Umbilical hernia repair      MEDICATIONS: Current Outpatient Prescriptions on File Prior to Visit  Medication Sig Dispense Refill  . b complex vitamins tablet Take 1 tablet by mouth daily.      . cetirizine (ZYRTEC) 10 MG tablet Take 1 tablet (10 mg total) by mouth daily.  30 tablet  11  . Cholecalciferol (VITAMIN D) 2000 UNITS CAPS Take by mouth daily.       Marland Kitchen losartan (COZAAR) 100 MG tablet TAKE ONE TABLET BY MOUTH ONCE DAILY  90 tablet  1  . Multiple Vitamin (MULTIVITAMIN) tablet Take 1 tablet by mouth daily.        . Omega 3-6-9 Fatty Acids (OMEGA 3-6-9 COMPLEX PO) Take 1 capsule by mouth daily.        Marland Kitchen omeprazole (PRILOSEC) 20 MG capsule Take 1 capsule (20 mg total) by mouth 2 (two) times daily as needed. For acid reflux  60 capsule  3   No current facility-administered medications on file prior to visit.    ALLERGIES: Allergies  Allergen Reactions  . Codeine Nausea And Vomiting  . Lisinopril-Hydrochlorothiazide Hives    REACTION: lip swelling    FAMILY HISTORY: Family History  Problem Relation Age of Onset  . Arthritis Mother   . Arthritis Father   . Alcohol abuse Other     parent    SOCIAL HISTORY:  History   Social History  . Marital Status: Married    Spouse Name: N/A    Number of Children: N/A  . Years of Education: N/A   Occupational History  . retired    Social History Main Topics  . Smoking status: Never Smoker   . Smokeless tobacco: Not on file     Comment: Married, lives with spouse, dtr 2 g-kids.  . Alcohol Use: No     Comment: rare  . Drug Use: No  . Sexual Activity: Not on file   Other Topics Concern  . Not on file   Social History Narrative  . No narrative on file    REVIEW OF  SYSTEMS: Constitutional: No fevers, chills, or sweats, no generalized fatigue, change in appetite Eyes: No visual changes, double vision, eye pain Ear, nose and throat: No hearing loss, ear pain, nasal congestion, sore throat Cardiovascular: No chest pain, palpitations Respiratory:  No shortness of breath at rest or with exertion, wheezes GastrointestinaI: No nausea, vomiting, diarrhea, abdominal pain, fecal incontinence Genitourinary:  No dysuria, urinary retention or frequency Musculoskeletal:  No neck pain, back pain Integumentary: No rash, pruritus, skin lesions Neurological: as above Psychiatric: No depression, insomnia, anxiety Endocrine: No palpitations, fatigue, diaphoresis, mood swings, change in appetite, change in weight, increased thirst Hematologic/Lymphatic:  No anemia, purpura, petechiae. Allergic/Immunologic: no itchy/runny eyes, nasal congestion, recent allergic reactions, rashes  PHYSICAL EXAM: Filed Vitals:   10/06/13 0800  BP: 136/84  Pulse: 80  Resp: 16   General: No acute distress Head:  Normocephalic/atraumatic Neck: supple, no paraspinal tenderness, full range of motion Back: No paraspinal tenderness Heart: regular rate and rhythm Lungs: Clear to auscultation bilaterally. Vascular: No carotid bruits. Neurological Exam: Mental status: alert and oriented to person, place, and time, speech fluent and not dysarthric, language intact. Cranial nerves: CN I: not tested CN II: pupils equal, round and reactive to light, visual fields intact, fundi unremarkable. CN III, IV, VI:  full range of motion, no nystagmus, no ptosis CN V: facial sensation intact CN VII: upper and lower face symmetric CN VIII: hearing intact CN IX, X: gag intact, uvula midline CN XI: sternocleidomastoid and trapezius muscles intact CN XII: tongue midline Bulk & Tone: normal, no fasciculations. Motor: 5/5 throughout Sensation: temperature and vibration intact Deep Tendon Reflexes:  brisk throughout, toes down Finger to nose testing: normal Gait: normal stance and stride.  Some mild difficulty with tandem walk. Romberg negative.  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  PLAN: 1.  We will try conservative management first, with indomethacin.  We will start with 25mg  daily and can titrate to 25mg  TID.  Should take prilosec daily and not take other NSAIDs 2.  If ineffective, may need to consider evaluation by neurosurgery, although the risks of such surgery may outweigh benefits. 3.  Follow up in 4 weeks.  Thank you for allowing me to take part in the care of this patient.  Metta Clines, DO  CC:  Gwendolyn Grant, MD

## 2013-10-06 NOTE — Patient Instructions (Addendum)
Most likely, you have a cough headache, which may be due to a Chiari Malformation, as discussed. 1.  We will start indomethacin, which is a nonsteroidal anti-inflammatory drug (so don't take other similar drugs such as Advil or Aleve while on this).  Take 1 pill daily for 7 days.  If no improvement (or only mild improvement), then increase to 1 pill twice daily for 7 days.  Then if still no improvement or only mild improvement, you may increase to 1 pill three times daily.  You may stay at a lower dose if it is effective.  Since this medication can cause reflux and increased acid in your stomach, take the Prilosec daily. 2.  Utimately, if medication is ineffective, then may need evaluation by neurosurgeon. 3.  Follow up in 4 weeks.  Call with questions or concerns.

## 2013-10-15 ENCOUNTER — Ambulatory Visit (HOSPITAL_COMMUNITY): Payer: Medicare HMO

## 2013-10-21 ENCOUNTER — Encounter: Payer: Self-pay | Admitting: Internal Medicine

## 2013-10-21 ENCOUNTER — Ambulatory Visit (INDEPENDENT_AMBULATORY_CARE_PROVIDER_SITE_OTHER): Payer: Medicare HMO | Admitting: Internal Medicine

## 2013-10-21 VITALS — BP 142/80 | HR 72 | Temp 98.4°F | Wt 229.1 lb

## 2013-10-21 DIAGNOSIS — F329 Major depressive disorder, single episode, unspecified: Secondary | ICD-10-CM

## 2013-10-21 DIAGNOSIS — R51 Headache: Secondary | ICD-10-CM

## 2013-10-21 DIAGNOSIS — R519 Headache, unspecified: Secondary | ICD-10-CM | POA: Insufficient documentation

## 2013-10-21 DIAGNOSIS — F32A Depression, unspecified: Secondary | ICD-10-CM

## 2013-10-21 DIAGNOSIS — F3289 Other specified depressive episodes: Secondary | ICD-10-CM

## 2013-10-21 DIAGNOSIS — IMO0002 Reserved for concepts with insufficient information to code with codable children: Secondary | ICD-10-CM

## 2013-10-21 DIAGNOSIS — I10 Essential (primary) hypertension: Secondary | ICD-10-CM

## 2013-10-21 DIAGNOSIS — Q054 Unspecified spina bifida with hydrocephalus: Secondary | ICD-10-CM

## 2013-10-21 MED ORDER — FLUOXETINE HCL 20 MG PO TABS: 20.0000 mg | ORAL_TABLET | Freq: Every day | ORAL | Status: DC

## 2013-10-21 MED ORDER — ALBUTEROL SULFATE HFA 108 (90 BASE) MCG/ACT IN AERS: 2.0000 | INHALATION_SPRAY | Freq: Four times a day (QID) | RESPIRATORY_TRACT | Status: DC | PRN

## 2013-10-21 MED ORDER — OMEPRAZOLE 20 MG PO CPDR: 20.0000 mg | DELAYED_RELEASE_CAPSULE | Freq: Every day | ORAL | Status: DC

## 2013-10-21 NOTE — Assessment & Plan Note (Signed)
  BP Readings from Last 3 Encounters:  10/21/13 142/80  10/06/13 136/84  09/09/13 148/78   Improved with regular ARB use The current medical regimen is effective;  continue present plan and medications.

## 2013-10-21 NOTE — Assessment & Plan Note (Signed)
Chronic since summer 2014 -  Always R side - worse with cough - ?related to Chiari malformation on MRI brain as per neuro eval 09/2013 On trial low dose Indomethacin -  No change recommended - follow up with neuro as planned

## 2013-10-21 NOTE — Patient Instructions (Addendum)
It was good to see you today.  We have reviewed your prior records including labs and tests today  Medications reviewed and updated, resume generic Prozac at higher dose -no other changes recommended at this time.  Your prescription(s) have been submitted to your pharmacy. Please take as directed and contact our office if you believe you are having problem(s) with the medication(s).  Continue working with Dr Tomi Likens on your headache as ongoing  Please schedule followup in 3 months for mood check, call sooner if problems.   Depression, Adult Depression refers to feeling sad, low, down in the dumps, blue, gloomy, or empty. In general, there are two kinds of depression: 1. Depression that we all experience from time to time because of upsetting life experiences, including the loss of a job or the ending of a relationship (normal sadness or normal grief). This kind of depression is considered normal, is short lived, and resolves within a few days to 2 weeks. (Depression experienced after the loss of a loved one is called bereavement. Bereavement often lasts longer than 2 weeks but normally gets better with time.) 2. Clinical depression, which lasts longer than normal sadness or normal grief or interferes with your ability to function at home, at work, and in school. It also interferes with your personal relationships. It affects almost every aspect of your life. Clinical depression is an illness. Symptoms of depression also can be caused by conditions other than normal sadness and grief or clinical depression. Examples of these conditions are listed as follows:  Physical illness Some physical illnesses, including underactive thyroid gland (hypothyroidism), severe anemia, specific types of cancer, diabetes, uncontrolled seizures, heart and lung problems, strokes, and chronic pain are commonly associated with symptoms of depression.  Side effects of some prescription medicine In some people, certain  types of prescription medicine can cause symptoms of depression.  Substance abuse Abuse of alcohol and illicit drugs can cause symptoms of depression. SYMPTOMS Symptoms of normal sadness and normal grief include the following:  Feeling sad or crying for short periods of time.  Not caring about anything (apathy).  Difficulty sleeping or sleeping too much.  No longer able to enjoy the things you used to enjoy.  Desire to be by oneself all the time (social isolation).  Lack of energy or motivation.  Difficulty concentrating or remembering.  Change in appetite or weight.  Restlessness or agitation. Symptoms of clinical depression include the same symptoms of normal sadness or normal grief and also the following symptoms:  Feeling sad or crying all the time.  Feelings of guilt or worthlessness.  Feelings of hopelessness or helplessness.  Thoughts of suicide or the desire to harm yourself (suicidal ideation).  Loss of touch with reality (psychotic symptoms). Seeing or hearing things that are not real (hallucinations) or having false beliefs about your life or the people around you (delusions and paranoia). DIAGNOSIS  The diagnosis of clinical depression usually is based on the severity and duration of the symptoms. Your caregiver also will ask you questions about your medical history and substance use to find out if physical illness, use of prescription medicine, or substance abuse is causing your depression. Your caregiver also may order blood tests. TREATMENT  Typically, normal sadness and normal grief do not require treatment. However, sometimes antidepressant medicine is prescribed for bereavement to ease the depressive symptoms until they resolve. The treatment for clinical depression depends on the severity of your symptoms but typically includes antidepressant medicine, counseling with a mental health  professional, or a combination of both. Your caregiver will help to determine  what treatment is best for you. Depression caused by physical illness usually goes away with appropriate medical treatment of the illness. If prescription medicine is causing depression, talk with your caregiver about stopping the medicine, decreasing the dose, or substituting another medicine. Depression caused by abuse of alcohol or illicit drugs abuse goes away with abstinence from these substances. Some adults need professional help in order to stop drinking or using drugs. SEEK IMMEDIATE CARE IF:  You have thoughts about hurting yourself or others.  You lose touch with reality (have psychotic symptoms).  You are taking medicine for depression and have a serious side effect. FOR MORE INFORMATION National Alliance on Mental Illness: www.nami.[employer]: https://carter.com/ Document Released: 09/07/2000 Document Revised: 03/11/2012 Document Reviewed: 12/10/2011 [facility] Patient Information 2014 [place].

## 2013-10-21 NOTE — Assessment & Plan Note (Signed)
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

## 2013-10-21 NOTE — Progress Notes (Signed)
   Subjective:    Patient ID: Anne Barnes, female    DOB: August 12, 1946, 68 y.o.   MRN: [id number]  HPI  Here for three-month followup -reviewed chronic medical issues and interval medical events  Right-sided headache, exacerbated by coughing - ongoing >3 mo - question due to Chiari malformation as per MRI 09/2013 and neuro eval on same - on NSAIDs - indometh 25 TID at this time, no significant change in symptoms. Recent office visits for same reviewed including imaging reviewed  OSA, variable compliance with CPAP due to poor fitting mask, increase nocturnal mucus -denies daytime sleepiness or snoring  HTN - variable compliance with meds >7mo - no new headache, edema or chest pain  Osteoarthritis -no joint swelling, takes Tylenol as needed for same with ineffective relief, requests prescription medication  Past Medical History  Diagnosis Date  . ALLERGIC RHINITIS   . DIVERTICULOSIS, COLON   . GERD   . HYPERTENSION   . OBSTRUCTIVE SLEEP APNEA 12/2008 dx    noncompliant with CPAP qhs  . Sciatica     right side    Review of Systems  Constitutional: Negative for fever and fatigue.  Respiratory: Negative for cough and shortness of breath.   Cardiovascular: Negative for chest pain and leg swelling.  Neurological: Positive for headaches (unchanged). Negative for syncope and weakness.       Objective:   Physical Exam BP 142/80  Pulse 72  Temp(Src) 98.4 F (36.9 C) (Oral)  Wt 229 lb 1.9 oz (103.928 kg)  SpO2 96% Wt Readings from Last 3 Encounters:  10/21/13 229 lb 1.9 oz (103.928 kg)  10/06/13 225 lb 2 oz (102.116 kg)  09/09/13 223 lb 3.2 oz (101.243 kg)   Constitutional: She is obese, appears well-developed and well-nourished. No distress.  Neck: Normal range of motion. Neck supple. No JVD present. No thyromegaly present.  Cardiovascular: Normal rate, regular rhythm and normal heart sounds.  No murmur heard. No BLE edema. Pulmonary/Chest: Effort normal and breath sounds  normal. No respiratory distress. She has no wheezes.  Neurological: She is alert and oriented to person, place, and time. No cranial nerve deficit. Coordination, balance, strength, speech and gait are normal.  Skin: Skin is warm and dry. No rash noted. No erythema.  Psychiatric: She has a dysphoric, flat mood and affect. Her behavior is normal. Judgment and thought content normal.   Lab Results  Component Value Date   WBC 8.8 02/26/2013   HGB 13.8 02/26/2013   HCT 41.5 02/26/2013   PLT 231.0 02/26/2013   GLUCOSE 85 02/26/2013   CHOL 177 02/26/2013   TRIG 37.0 02/26/2013   HDL 41.20 02/26/2013   LDLCALC 128* 02/26/2013   ALT 19 02/26/2013   AST 14 02/26/2013   NA 142 02/26/2013   K 4.3 02/26/2013   CL 104 02/26/2013   CREATININE 0.7 02/26/2013   BUN 18 02/26/2013   CO2 30 02/26/2013   TSH 1.18 02/26/2013   HGBA1C  Value: 5.5 (NOTE) The ADA recommends the following therapeutic goal for glycemic control related to Hgb A1c measurement: Goal of therapy: <6.5 Hgb A1c  Reference: American Diabetes Association: Clinical Practice Recommendations 2010, Diabetes Care, 2010, 33: (Suppl  1). 12/15/2009        Assessment & Plan:   See problem list. Medications and labs reviewed today.

## 2013-10-21 NOTE — Progress Notes (Signed)
Pre-visit discussion using our clinic review tool. No additional management support is needed unless otherwise documented below in the visit note.  

## 2013-10-23 ENCOUNTER — Telehealth: Payer: Self-pay | Admitting: Internal Medicine

## 2013-10-23 NOTE — Telephone Encounter (Signed)
Relevant patient education assigned to patient using Emmi. ° °

## 2013-11-03 ENCOUNTER — Encounter: Payer: Self-pay | Admitting: Neurology

## 2013-11-03 ENCOUNTER — Ambulatory Visit (INDEPENDENT_AMBULATORY_CARE_PROVIDER_SITE_OTHER): Payer: Medicare HMO | Admitting: Neurology

## 2013-11-03 VITALS — BP 130/69 | HR 70 | Temp 98.4°F | Resp 18 | Ht 60.0 in | Wt 225.3 lb

## 2013-11-03 DIAGNOSIS — G4483 Primary cough headache: Secondary | ICD-10-CM

## 2013-11-03 DIAGNOSIS — G44099 Other trigeminal autonomic cephalgias (TAC), not intractable: Secondary | ICD-10-CM

## 2013-11-03 DIAGNOSIS — R51 Headache: Secondary | ICD-10-CM

## 2013-11-03 NOTE — Progress Notes (Signed)
NEUROLOGY FOLLOW UP OFFICE NOTE  Anne Barnes [id number]  HISTORY OF PRESENT ILLNESS: Anne Barnes is a 68 year old right-handed woman with history of OSA, chronic sinusitis, and hypertension who follows up for cough headache in setting of Chiari malformation.  Records and images were personally reviewed where available.    Since starting indomethacin dose of 25mg  TID, she feels 75% better.  Her cough and upper respiratory symptoms are better too, but the pain isn't nearly as severe when she does cough.  She has a lingering mild pressure-sensation on the left side of her head, associated with eye lacrimation.  She has had this for about a year.  Initially, she had brief episodes of severe left sided headache associated with it.  PAST MEDICAL HISTORY: Past Medical History  Diagnosis Date  . ALLERGIC RHINITIS   . DIVERTICULOSIS, COLON   . GERD   . HYPERTENSION   . OBSTRUCTIVE SLEEP APNEA 12/2008 dx    noncompliant with CPAP qhs  . Sciatica     right side  . Chiari malformation     Noted MRI brain 09/2013 - s/p neuro eval for same    MEDICATIONS: Current Outpatient Prescriptions on File Prior to Visit  Medication Sig Dispense Refill  . albuterol (PROVENTIL HFA;VENTOLIN HFA) 108 (90 BASE) MCG/ACT inhaler Inhale 2 puffs into the lungs every 6 (six) hours as needed for wheezing or shortness of breath.  1 Inhaler  0  . b complex vitamins tablet Take 1 tablet by mouth daily.      . cetirizine (ZYRTEC) 10 MG tablet Take 1 tablet (10 mg total) by mouth daily.  30 tablet  11  . Cholecalciferol (VITAMIN D) 2000 UNITS CAPS Take by mouth daily.       Marland Kitchen FLUoxetine (PROZAC) 20 MG tablet Take 1 tablet (20 mg total) by mouth daily.  30 tablet  3  . ibuprofen (ADVIL,MOTRIN) 200 MG tablet Take 200 mg by mouth every 6 (six) hours as needed.      . indomethacin (INDOCIN) 25 MG capsule Take 1pill up to TID.  90 capsule  0  . losartan (COZAAR) 100 MG tablet TAKE ONE TABLET BY MOUTH ONCE DAILY  90  tablet  1  . Multiple Vitamin (MULTIVITAMIN) tablet Take 1 tablet by mouth daily.        . naproxen sodium (ANAPROX) 220 MG tablet Take 220 mg by mouth 2 (two) times daily with a meal.      . Omega 3-6-9 Fatty Acids (OMEGA 3-6-9 COMPLEX PO) Take 1 capsule by mouth daily.        Marland Kitchen omeprazole (PRILOSEC) 20 MG capsule Take 1 capsule (20 mg total) by mouth daily.  30 capsule  3   No current facility-administered medications on file prior to visit.    ALLERGIES: Allergies  Allergen Reactions  . Codeine Nausea And Vomiting  . Lisinopril-Hydrochlorothiazide Hives    REACTION: lip swelling    FAMILY HISTORY: Family History  Problem Relation Age of Onset  . Arthritis Mother   . Arthritis Father   . Alcohol abuse Other     parent  . Ataxia Neg Hx   . Chorea Neg Hx   . Dementia Neg Hx   . Mental retardation Neg Hx   . Migraines Neg Hx   . Multiple sclerosis Neg Hx   . Neurofibromatosis Neg Hx   . Neuropathy Neg Hx   . Parkinsonism Neg Hx   . Seizures Neg Hx   .  Stroke Neg Hx     SOCIAL HISTORY: History   Social History  . Marital Status: Married    Spouse Name: N/A    Number of Children: N/A  . Years of Education: N/A   Occupational History  . retired    Social History Main Topics  . Smoking status: Never Smoker   . Smokeless tobacco: Not on file     Comment: Married, lives with spouse, dtr 2 g-kids.  . Alcohol Use: No     Comment: rare  . Drug Use: No  . Sexual Activity: Not on file   Other Topics Concern  . Not on file   Social History Narrative  . No narrative on file    REVIEW OF SYSTEMS: Constitutional: No fevers, chills, or sweats, no generalized fatigue, change in appetite Eyes: No visual changes, double vision, eye pain Ear, nose and throat: Left eye tears, cough Cardiovascular: No chest pain, palpitations Respiratory:  No shortness of breath at rest or with exertion, wheezes GastrointestinaI: No nausea, vomiting, diarrhea, abdominal pain, fecal  incontinence Genitourinary:  No dysuria, urinary retention or frequency Musculoskeletal:  No neck pain, back pain Integumentary: No rash, pruritus, skin lesions Neurological: as above Psychiatric: No depression, insomnia, anxiety Endocrine: No palpitations, fatigue, diaphoresis, mood swings, change in appetite, change in weight, increased thirst Hematologic/Lymphatic:  No anemia, purpura, petechiae. Allergic/Immunologic: no itchy/runny eyes, nasal congestion, recent allergic reactions, rashes  PHYSICAL EXAM: Filed Vitals:   11/03/13 0745  BP: 130/69  Pulse: 70  Temp: 98.4 F (36.9 C)  Resp: 18   General: No acute distress Head:  Normocephalic/atraumatic Neck: supple, no paraspinal tenderness, full range of motion Heart:  Regular rate and rhythm Lungs:  Clear to auscultation bilaterally Back: No paraspinal tenderness Neurological Exam: alert and oriented to person, place, and time. Speech fluent and not dysarthric, language intact.  CN II-XII intact. Fundoscopic exam unremarkable, no papilledema.  Bulk and tone normal, muscle strength 5/5 throughout.  Sensation to light touch, temperature and vibration intact.  Deep tendon reflexes 2+ throughout.  Finger to nose  intact.  Gait normal.  IMPRESSION: 1.  Cough headache, improved.   2.  Trigeminal autonomic cephalgia.  It sounds like she has a component of a trigeminal autonomic cephalgia as well.  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  PLAN: 1.  Will further titrate indomethacin to 50mg  twice daily.  Continue PPI. 2.  For sake of completeness, will order MRA of head to rule out other secondary cause of cough headache. 3.  Follow up in 4 weeks.  Metta Clines, DO  CC: Gwendolyn Grant, MD

## 2013-11-03 NOTE — Patient Instructions (Addendum)
1.  Start taking 2 indomethacin pills in the morning (total 50mg ) and 2 indomethacin pills at night (total 50mg ).  When you run out, call and I can refill for 50mg  pills so you just have to take 1 pill twice a day. 2.  For sake of completeness, will order MRA of head to look for other causes of headache. 11/19/13 at [facility]; main entrance radiology  3.  Follow up in 4 weeks.

## 2013-11-12 ENCOUNTER — Telehealth: Payer: Self-pay | Admitting: Neurology

## 2013-11-12 ENCOUNTER — Telehealth: Payer: Self-pay | Admitting: *Deleted

## 2013-11-12 ENCOUNTER — Other Ambulatory Visit: Payer: Self-pay | Admitting: *Deleted

## 2013-11-12 NOTE — Telephone Encounter (Signed)
Please refill indomethacin.  Prescribe 50mg  tablets.  1 tablet BID, #60 with 5 refills.

## 2013-11-12 NOTE — Telephone Encounter (Signed)
Please see below and advise.

## 2013-11-12 NOTE — Telephone Encounter (Signed)
Refill  For indomethacin 50 mg #60 with 5 refills called  to [facility]

## 2013-11-12 NOTE — Telephone Encounter (Signed)
Patient requesting refill on indomethacin  Please advise

## 2013-11-12 NOTE — Telephone Encounter (Signed)
Pt called requesting a refill for:  INDOMETHACIN 50mg  bid Pharmacy: [facility] [address] in [place]

## 2013-11-16 MED ORDER — INDOMETHACIN 25 MG PO CAPS: ORAL_CAPSULE | ORAL | Status: DC

## 2013-11-16 NOTE — Telephone Encounter (Signed)
Patient  Requesting refill on indomethacin 25 mg  1 tab TID last refill 10/08/13

## 2013-11-18 ENCOUNTER — Ambulatory Visit (HOSPITAL_COMMUNITY): Payer: Medicare HMO

## 2013-11-19 ENCOUNTER — Ambulatory Visit (HOSPITAL_COMMUNITY): Admission: RE | Admit: 2013-11-19 | Payer: Medicare HMO | Source: Ambulatory Visit

## 2013-11-23 ENCOUNTER — Telehealth: Payer: Self-pay | Admitting: Neurology

## 2013-11-23 NOTE — Telephone Encounter (Signed)
Please advise on below  Note

## 2013-11-23 NOTE — Telephone Encounter (Signed)
Increase indomethacin to 50mg  in morning and 100mg  at bedtime.

## 2013-11-23 NOTE — Telephone Encounter (Signed)
Patient will increase indomethacin to 50 mg in am and 100mg  at HS advised to call office if this did not help

## 2013-11-23 NOTE — Telephone Encounter (Signed)
Ongoing issues w/ h/a's. Wonders if she can increase or change meds. Please call  - [phone]. / Sherri S.

## 2013-11-24 ENCOUNTER — Ambulatory Visit (HOSPITAL_COMMUNITY)
Admission: RE | Admit: 2013-11-24 | Discharge: 2013-11-24 | Disposition: A | Discharge status: Home / Self Care | Payer: Medicare HMO | Source: Ambulatory Visit | Attending: Neurology | Admitting: Neurology

## 2013-11-24 ENCOUNTER — Ambulatory Visit (HOSPITAL_COMMUNITY): Admission: RE | Admit: 2013-11-24 | Payer: Medicare HMO | Source: Ambulatory Visit

## 2013-11-24 DIAGNOSIS — R42 Dizziness and giddiness: Secondary | ICD-10-CM | POA: Insufficient documentation

## 2013-11-27 ENCOUNTER — Telehealth: Payer: Self-pay | Admitting: Neurology

## 2013-11-27 NOTE — Telephone Encounter (Signed)
Pt called wanting to know if the results were in for her MRA  She had done on Tuesday 11/24/13.

## 2013-11-27 NOTE — Telephone Encounter (Signed)
Patient called asking for results of MRA today no results are ready at this time .I explained to patient someone would call her when the test results were back.

## 2013-11-30 NOTE — Telephone Encounter (Signed)
Patient is aware of findings on MRA

## 2013-12-01 ENCOUNTER — Ambulatory Visit (INDEPENDENT_AMBULATORY_CARE_PROVIDER_SITE_OTHER): Payer: Medicare HMO | Admitting: Neurology

## 2013-12-01 ENCOUNTER — Encounter: Payer: Self-pay | Admitting: Neurology

## 2013-12-01 ENCOUNTER — Ambulatory Visit (HOSPITAL_COMMUNITY)
Admission: RE | Admit: 2013-12-01 | Discharge: 2013-12-01 | Disposition: A | Discharge status: Home / Self Care | Payer: Medicare HMO | Source: Ambulatory Visit | Attending: Neurology | Admitting: Neurology

## 2013-12-01 VITALS — BP 164/90 | HR 70 | Temp 98.0°F | Resp 18 | Ht 60.0 in | Wt 226.4 lb

## 2013-12-01 DIAGNOSIS — G4483 Primary cough headache: Secondary | ICD-10-CM

## 2013-12-01 DIAGNOSIS — Q054 Unspecified spina bifida with hydrocephalus: Secondary | ICD-10-CM

## 2013-12-01 DIAGNOSIS — IMO0002 Reserved for concepts with insufficient information to code with codable children: Secondary | ICD-10-CM

## 2013-12-01 DIAGNOSIS — R51 Headache: Secondary | ICD-10-CM

## 2013-12-01 LAB — SEDIMENTATION RATE: SED RATE: 4 mm/h (ref 0–22)

## 2013-12-01 NOTE — Progress Notes (Signed)
NEUROLOGY FOLLOW UP OFFICE NOTE  PEYTAN ANDRINGA [id number]  HISTORY OF PRESENT ILLNESS: Anne Barnes is a 68 year old right-handed woman with history of OSA, chronic sinusitis, and hypertension who follows up for cough headache in setting of Chiari malformation.  She is accompanied by her husband.  Records and images were personally reviewed where available.    UPDATE: She initially felt a lot better when indomethacin was initiated.  Dose was eventually titrated to 81m three times daily.  However, things have gotten worse.  The indomethacin has been causing GI upset, despite taking a PPI.  She also notes increased runny nose and bilateral eye lacrimation.  When she coughs or sneezes, it produces the brief head pressure, sometimes painful.  She will get pain behind her left ear and down the neck.  Sometimes the indomethacin is helpful but other times not.  She notes ringing in the right ear, but not really aural fullness.  She notes tenderness of her neck, which occurs at any time.  She denies vision problems.  She has been taking naproxen rarely in addition to the indomethacin.  She has not taken her blood pressure medication for the past two days because she is preoccupied with her headache.  BP today is 160s/90s.  No fevers.  She also has nausea.  HISTORY: Onset:  8 months ago, following sinusitis. Location:  Bi-frontal, left temporal, left maxillary.  Also has separate aching pain behind left ear and down the left side of neck, not radiating to extremity. Quality:  Pounding, when severe but otherwise a pressure-like discomfort Intensity:  10/10 when triggered Associated symptoms:  No nausea, vomiting, photophobia, phonophobia, visual disturbance, visual loss, vertigo, fever.  Feels a little lightheaded Aura:  no Initial Duration:  Just seconds, following a cough Initial Frequency:  25 days out of the month Activity:  No change Triggers/exacerbating factors:  Triggered by cough or  sneezing.  Not usually by bending over.  Also has an underlying constant achiness Relieving factors:  Aleve Past abortive therapy:  Prednisone taper (ineffective) Past preventative therapy:  none Sleep hygiene:  Uses CPAP.  Feels rested Stress/depression:  Some stress caring for her 748year old grandson with behavioral problems Family history:  Son has headaches. Personal history of headache:  Did have "stress headache" when she was younger while having her children.  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  PAST MEDICAL HISTORY: Past Medical History  Diagnosis Date  . ALLERGIC RHINITIS   . DIVERTICULOSIS, COLON   . GERD   . HYPERTENSION   . OBSTRUCTIVE SLEEP APNEA 12/2008 dx    noncompliant with CPAP qhs  . Sciatica     right side  . Chiari malformation     Noted MRI brain 09/2013 - s/p neuro eval for same    MEDICATIONS: Current Outpatient Prescriptions on File Prior to Visit  Medication Sig Dispense Refill  . albuterol (PROVENTIL HFA;VENTOLIN HFA) 108 (90 BASE) MCG/ACT inhaler Inhale 2 puffs into the lungs every 6 (six) hours as needed for wheezing or shortness of breath.  1 Inhaler  0  . b complex vitamins tablet Take 1 tablet by mouth daily.      . cetirizine (ZYRTEC) 10 MG tablet Take 1 tablet (10 mg total) by mouth daily.  30 tablet  11  . Cholecalciferol (VITAMIN D)  2000 UNITS CAPS Take by mouth daily.       Marland Kitchen FLUoxetine (PROZAC) 20 MG tablet Take 1 tablet (20 mg total) by mouth daily.  30 tablet  3  . ibuprofen (ADVIL,MOTRIN) 200 MG tablet Take 200 mg by mouth every 6 (six) hours as needed.      . indomethacin (INDOCIN) 25 MG capsule Take 1pill up to TID.  90 capsule  3  . losartan (COZAAR) 100 MG tablet TAKE ONE TABLET BY MOUTH ONCE DAILY  90 tablet  1  . Multiple Vitamin (MULTIVITAMIN)  tablet Take 1 tablet by mouth daily.        . naproxen sodium (ANAPROX) 220 MG tablet Take 220 mg by mouth 2 (two) times daily with a meal.      . Omega 3-6-9 Fatty Acids (OMEGA 3-6-9 COMPLEX PO) Take 1 capsule by mouth daily.        Marland Kitchen omeprazole (PRILOSEC) 20 MG capsule Take 1 capsule (20 mg total) by mouth daily.  30 capsule  3   No current facility-administered medications on file prior to visit.    ALLERGIES: Allergies  Allergen Reactions  . Codeine Nausea And Vomiting  . Lisinopril-Hydrochlorothiazide Hives    REACTION: lip swelling    FAMILY HISTORY: Family History  Problem Relation Age of Onset  . Arthritis Mother   . Arthritis Father   . Alcohol abuse Other     parent  . Ataxia Neg Hx   . Chorea Neg Hx   . Dementia Neg Hx   . Mental retardation Neg Hx   . Migraines Neg Hx   . Multiple sclerosis Neg Hx   . Neurofibromatosis Neg Hx   . Neuropathy Neg Hx   . Parkinsonism Neg Hx   . Seizures Neg Hx   . Stroke Neg Hx     SOCIAL HISTORY: History   Social History  . Marital Status: Married    Spouse Name: N/A    Number of Children: N/A  . Years of Education: N/A   Occupational History  . retired    Social History Main Topics  . Smoking status: Never Smoker   . Smokeless tobacco: Not on file     Comment: Married, lives with spouse, dtr 2 g-kids.  . Alcohol Use: No     Comment: rare  . Drug Use: No  . Sexual Activity: Not on file   Other Topics Concern  . Not on file   Social History Narrative  . No narrative on file    REVIEW OF SYSTEMS: Constitutional: No fevers, chills, or sweats, no generalized fatigue, change in appetite Eyes: No visual changes, double vision, eye pain.  Eyes watery. Ear, nose and throat: runny nose.  Posterior ear pain. Cardiovascular: No chest pain, palpitations Respiratory:  No shortness of breath at rest or with exertion, wheezes GastrointestinaI: No nausea, vomiting, diarrhea, abdominal pain, fecal  incontinence Genitourinary:  No dysuria, urinary retention or frequency Musculoskeletal: Neck pain Integumentary: No rash, pruritus, skin lesions Neurological: as above Psychiatric: No depression, insomnia, anxiety Endocrine: No palpitations, fatigue, diaphoresis, mood swings, change in appetite, change in weight, increased thirst Hematologic/Lymphatic:  No anemia, purpura, petechiae. Allergic/Immunologic: no itchy/runny eyes, nasal congestion, recent allergic reactions, rashes  PHYSICAL EXAM: Filed Vitals:   12/01/13 0850  BP: 164/90  Pulse: 70  Temp: 98 F (36.7 C)  Resp: 18   General: No acute distress Head:  Normocephalic/atraumatic.  Left suboccipital pain Neck: supple, left sided paraspinal tenderness, full range of motion  Heart:  Regular rate and rhythm Lungs:  Clear to auscultation bilaterally Back: No paraspinal tenderness Neurological Exam: alert and oriented to person, place, and time. Speech fluent and not dysarthric, language intact.  CN II-XII intact. Fundoscopic exam unremarkable, no papilledema.  Bulk and tone normal, muscle strength 5/5 throughout.  Sensation to light touch, temperature and vibration intact.  Deep tendon reflexes 2+ throughout.  Finger to nose  intact.  Gait normal.  IMPRESSION: Chronic daily headaches Cough headache Chiari malformation.  Worse than last time.  It seems multi-factorial with several possible etiologies contributing.  I suspect she may be getting medication-overuse headache.  There may be a cervicogenic component.  These headaches are triggered by coughing or sneezing.  She has had increased runny nose and the coughs.  Coughing may be triggered by postnasal drip from acid reflux.  Posterior ear pain and headache may be triggered by possible eustachian tube dysfunction, caused by her upper respiratory symptoms.  PLAN: 1.  We will taper off indomethacin since it may be contributing to medication overuse headache and GI upset. 2.  We  will check an ECG.  If she does not have any evidence of prolonged QT interval, would consider starting nortriptyline for preventative headache management.  Side effects discussed.  She was just recently started on fluoxetine, so if we start nortriptyline, I would discontinue fluoxetine. 3.  Consider left occipital nerve block in future 4.  Check ESR 5.  Follow up in 6 weeks.  Metta Clines, DO  CC:  Gwendolyn Grant, MD

## 2013-12-01 NOTE — Patient Instructions (Addendum)
1.  I think the increased headaches may be due to too much pain medication.  I would taper off the indomethacin to 1 pill twice daily for two days, then 1 tablet daily for two days and then stop.  2.  We will check an EKG. [place]  12/01/13 11 am   If the heart looks okay, we can start an antidepressant used for daily headaches, called nortriptyline.  It may cause sleepiness and dizziness and dry mouth.  If we start this, you would have to stop the fluoxetine.  3.  Follow up in 6 weeks.  4.  Ask your PCP about eustachian tube dysfunction.

## 2013-12-02 ENCOUNTER — Telehealth: Payer: Self-pay | Admitting: *Deleted

## 2013-12-02 NOTE — Telephone Encounter (Signed)
Patient is aware if normal sed rate .

## 2014-01-06 ENCOUNTER — Ambulatory Visit (INDEPENDENT_AMBULATORY_CARE_PROVIDER_SITE_OTHER): Payer: Commercial Managed Care - HMO | Admitting: Internal Medicine

## 2014-01-06 ENCOUNTER — Encounter: Payer: Self-pay | Admitting: Internal Medicine

## 2014-01-06 VITALS — BP 142/80 | HR 58 | Temp 98.8°F | Wt 226.4 lb

## 2014-01-06 DIAGNOSIS — M1990 Unspecified osteoarthritis, unspecified site: Secondary | ICD-10-CM | POA: Insufficient documentation

## 2014-01-06 DIAGNOSIS — J309 Allergic rhinitis, unspecified: Secondary | ICD-10-CM

## 2014-01-06 DIAGNOSIS — R9431 Abnormal electrocardiogram [ECG] [EKG]: Secondary | ICD-10-CM | POA: Insufficient documentation

## 2014-01-06 DIAGNOSIS — R51 Headache: Secondary | ICD-10-CM

## 2014-01-06 MED ORDER — AZELASTINE HCL 0.1 % NA SOLN: 2.0000 | Freq: Two times a day (BID) | NASAL | Status: DC

## 2014-01-06 MED ORDER — FLUTICASONE PROPIONATE 50 MCG/ACT NA SUSP: 1.0000 | Freq: Every day | NASAL | Status: DC

## 2014-01-06 MED ORDER — MELOXICAM 15 MG PO TABS: 15.0000 mg | ORAL_TABLET | Freq: Every day | ORAL | Status: DC

## 2014-01-06 NOTE — Progress Notes (Signed)
Subjective:    Patient ID: Anne Barnes, female    DOB: May 02, 1946, 68 y.o.   MRN: [id number]  Sinusitis Associated symptoms include coughing (nonproductive), ear pain (R>L, chronic), headaches (due to cough with PND), sinus pressure and sneezing. Pertinent negatives include no shortness of breath.    Also reviewed chronic medical issues and interval medical events  Past Medical History  Diagnosis Date  . ALLERGIC RHINITIS   . DIVERTICULOSIS, COLON   . GERD   . HYPERTENSION   . OBSTRUCTIVE SLEEP APNEA 12/2008 dx    noncompliant with CPAP qhs  . Sciatica     right side  . Chiari malformation     Noted MRI brain 09/2013 - s/p neuro eval for same    Review of Systems  Constitutional: Positive for fatigue. Negative for fever, activity change, appetite change and unexpected weight change.  HENT: Positive for ear pain (R>L, chronic), hearing loss (B), postnasal drip, rhinorrhea, sinus pressure and sneezing. Negative for ear discharge, facial swelling and nosebleeds.   Respiratory: Positive for cough (nonproductive). Negative for shortness of breath and wheezing.   Cardiovascular: Negative for chest pain, palpitations and leg swelling.  Gastrointestinal: Negative for nausea, vomiting, abdominal pain, diarrhea, constipation and anal bleeding.  Musculoskeletal: Positive for arthralgias. Negative for gait problem and joint swelling.  Allergic/Immunologic: Positive for environmental allergies. Negative for food allergies and immunocompromised state.  Neurological: Positive for headaches (due to cough with PND). Negative for dizziness, tremors, seizures, facial asymmetry, weakness and light-headedness.       Objective:   Physical Exam  BP 142/80  Pulse 58  Temp(Src) 98.8 F (37.1 C) (Oral)  Wt 226 lb 6.4 oz (102.694 kg)  SpO2 97% Wt Readings from Last 3 Encounters:  01/06/14 226 lb 6.4 oz (102.694 kg)  12/01/13 226 lb 6.4 oz (102.694 kg)  11/03/13 225 lb 4.8 oz (102.195 kg)     Constitutional: She is MO, but appears well-developed and well-nourished. No distress.  HENT: hazy/opaque R TM, cloudy L TM - neither with erythema or effusion - no cerumen - sinus mildly tender B maxillary region Neck: Normal range of motion. Neck supple. No JVD present. No thyromegaly present.  Cardiovascular: Normal rate, regular rhythm and normal heart sounds.  No murmur heard. No BLE edema. Pulmonary/Chest: Effort normal and breath sounds normal. No respiratory distress. She has no wheezes.  Neurologic: AAOx4, CN2-12 symmetrically intact - Psychiatric: She has a normal mood and affect. Her behavior is normal. Judgment and thought content normal.   Lab Results  Component Value Date   WBC 8.8 02/26/2013   HGB 13.8 02/26/2013   HCT 41.5 02/26/2013   PLT 231.0 02/26/2013   GLUCOSE 85 02/26/2013   CHOL 177 02/26/2013   TRIG 37.0 02/26/2013   HDL 41.20 02/26/2013   LDLCALC 128* 02/26/2013   ALT 19 02/26/2013   AST 14 02/26/2013   NA 142 02/26/2013   K 4.3 02/26/2013   CL 104 02/26/2013   CREATININE 0.7 02/26/2013   BUN 18 02/26/2013   CO2 30 02/26/2013   TSH 1.18 02/26/2013   HGBA1C  Value: 5.5 (NOTE) The ADA recommends the following therapeutic goal for glycemic control related to Hgb A1c measurement: Goal of therapy: <6.5 Hgb A1c  Reference: American Diabetes Association: Clinical Practice Recommendations 2010, Diabetes Care, 2010, 33: (Suppl  1). 12/15/2009    No results found.  Reviewed 12/01/13 ECG: sinus bradycardia with T-wave inversions, question lateral ischemia -not significantly different from prior ECGs  Assessment & Plan:   Problem List Items Addressed This Visit   ALLERGIC RHINITIS - Primary     Chronic symptoms, seasonal flare Patient feels postnasal drip contributing to dry cough exacerbating headache symptoms Add nasal antihistamine to Flonase and Claritin as ongoing  Continue PPI and cough suppression Refer to allergist Consider need for ENT evaluation given subjective hearing loss  and ear pain if not felt related to eustachian tube dysfunction by allergist    Relevant Orders      Ambulatory referral to Allergy   Headache(784.0)     Chronic since summer 2014 -  Always R side - worse with cough - ? Chiari malformation on MRI brain as per neuro eval 09/2013 -workup feels unrelated anatomic change to symptoms as no neurologic findings to support same Transient temporary relief with over-the-counter Aleve, no other anti-inflammatory effective ( but requests use meloxicam for arthralgias as prescribed summer of 2014) No change recommended - follow up with neuro prn - ?TCA nortriptyline trial is felt unrelated to allergy symptoms for neuropathic pain    Relevant Orders      Ambulatory referral to Allergy   Nonspecific abnormal electrocardiogram (ECG) (EKG)     12/01/2013 ECG of neurology reviewed Patient reports always with "abnormal changes" and 2 stress test negative for underlying CAD (last at [facility] in ?2011 per pt) Denies classic anginal symptoms ongoing at this time  Declines referral to cardiology at this time , but agrees to call if symptoms worse or unimproved      Osteoarthritis     Previous experienced good relief of arthralgia symptoms with meloxicam prn (rx'dd June 2014) Same refill today, instructed on avoiding concurrent use with other NSAIDs (specifically not to use with Aleve, ibuprofen or Indocin) Patient understands and agrees

## 2014-01-06 NOTE — Progress Notes (Signed)
Pre visit review using our clinic review tool, if applicable. No additional management support is needed unless otherwise documented below in the visit note. 

## 2014-01-06 NOTE — Assessment & Plan Note (Signed)
Chronic since summer 2014 -  Always R side - worse with cough - ? Chiari malformation on MRI brain as per neuro eval 09/2013 -workup feels unrelated anatomic change to symptoms as no neurologic findings to support same Transient temporary relief with over-the-counter Aleve, no other anti-inflammatory effective ( but requests use meloxicam for arthralgias as prescribed summer of 2014) No change recommended - follow up with neuro prn - ?TCA nortriptyline trial is felt unrelated to allergy symptoms for neuropathic pain

## 2014-01-06 NOTE — Assessment & Plan Note (Signed)
Previous experienced good relief of arthralgia symptoms with meloxicam prn (rx'dd June 2014) Same refill today, instructed on avoiding concurrent use with other NSAIDs (specifically not to use with Aleve, ibuprofen or Indocin) Patient understands and agrees

## 2014-01-06 NOTE — Assessment & Plan Note (Signed)
12/01/2013 ECG of neurology reviewed Patient reports always with "abnormal changes" and 2 stress test negative for underlying CAD (last at [facility] in ?2011 per pt) Denies classic anginal symptoms ongoing at this time  Declines referral to cardiology at this time , but agrees to call if symptoms worse or unimproved

## 2014-01-06 NOTE — Assessment & Plan Note (Signed)
Chronic symptoms, seasonal flare Patient feels postnasal drip contributing to dry cough exacerbating headache symptoms Add nasal antihistamine to Flonase and Claritin as ongoing  Continue PPI and cough suppression Refer to allergist Consider need for ENT evaluation given subjective hearing loss and ear pain if not felt related to eustachian tube dysfunction by allergist

## 2014-01-06 NOTE — Patient Instructions (Signed)
It was good to see you today.  We have reviewed your prior records including labs and tests today  Medications reviewed and updated, resume generic Flonase and start Astelin nose sprays -no other changes recommended at this time.  Your prescription(s) have been submitted to your pharmacy. Please take as directed and contact our office if you believe you are having problem(s) with the medication(s).  Continue working with Dr Tomi Likens as needed  Please schedule followup in 3 months for recheck, call sooner if problems.

## 2014-01-19 ENCOUNTER — Ambulatory Visit: Payer: Medicare HMO | Admitting: Neurology

## 2014-01-19 ENCOUNTER — Telehealth: Payer: Self-pay | Admitting: Neurology

## 2014-01-19 NOTE — Telephone Encounter (Signed)
Pt no showed today's 6 week follow up appt w/ Dr. Metta Clines. No show letter mailed to pt / Sherri S.

## 2014-02-05 ENCOUNTER — Telehealth: Payer: Self-pay | Admitting: *Deleted

## 2014-02-05 NOTE — Telephone Encounter (Signed)
I spoke with this patient 2 times for several minutes she really likes the office and Dr Tomi Likens  But she feels she is just not getting the attention she needs due to a EKG that was done that she did not get the results for  . I explained to her that we did not  Have the EKG results they are not in the  [facility] system that is why she was not contacted by the office to start the medication.

## 2014-02-05 NOTE — Telephone Encounter (Signed)
PT HAS DECIDED NOT TO COME BACK. EKG RESULTS NEVER READ AND NO ONE EVER CALLED HER BACK. SHE DOES NOT WANT HIM AS HER DOCTOR BEING THAT UNPROFESSIONAL.

## 2014-02-12 ENCOUNTER — Institutional Professional Consult (permissible substitution): Payer: Commercial Managed Care - HMO | Admitting: Internal Medicine

## 2014-03-11 ENCOUNTER — Encounter: Payer: Self-pay | Admitting: Internal Medicine

## 2014-03-11 ENCOUNTER — Other Ambulatory Visit (INDEPENDENT_AMBULATORY_CARE_PROVIDER_SITE_OTHER): Payer: Commercial Managed Care - HMO

## 2014-03-11 ENCOUNTER — Ambulatory Visit (INDEPENDENT_AMBULATORY_CARE_PROVIDER_SITE_OTHER): Payer: Commercial Managed Care - HMO | Admitting: Internal Medicine

## 2014-03-11 VITALS — BP 154/90 | HR 72 | Ht 60.0 in | Wt 227.8 lb

## 2014-03-11 DIAGNOSIS — J309 Allergic rhinitis, unspecified: Secondary | ICD-10-CM

## 2014-03-11 DIAGNOSIS — H9209 Otalgia, unspecified ear: Secondary | ICD-10-CM

## 2014-03-11 DIAGNOSIS — J302 Other seasonal allergic rhinitis: Secondary | ICD-10-CM

## 2014-03-11 DIAGNOSIS — J3089 Other allergic rhinitis: Principal | ICD-10-CM

## 2014-03-11 DIAGNOSIS — H9202 Otalgia, left ear: Secondary | ICD-10-CM

## 2014-03-11 DIAGNOSIS — R062 Wheezing: Secondary | ICD-10-CM

## 2014-03-11 DIAGNOSIS — R51 Headache: Secondary | ICD-10-CM

## 2014-03-11 LAB — SEDIMENTATION RATE: SED RATE: 21 mm/h (ref 0–22)

## 2014-03-11 MED ORDER — FLUTICASONE FUROATE-VILANTEROL 100-25 MCG/INH IN AEPB: 1.0000 | INHALATION_SPRAY | Freq: Every day | RESPIRATORY_TRACT | Status: DC

## 2014-03-11 MED ORDER — IPRATROPIUM BROMIDE 0.06 % NA SOLN: NASAL | Status: DC

## 2014-03-11 NOTE — Patient Instructions (Signed)
Script sent to try ipratropium nasal spray for the drainage and pressure feelings  Sample Breo Ellipta  1 puff, then rinse mouth, One time daily  Order lab- Allergy profile, Food IgE profile, sed rate      Dx allergic rhintis, tinnitus

## 2014-03-11 NOTE — Progress Notes (Signed)
03/11/14- 67 yoF never smoker  COMPLAINS OF:  Referred by Dr. Uvaldo Bristle of pressure in her sinuses.  sinus drainage.  Seen by Dr Gwenette Greet for OSA Left frontal/temporal pressure discomfort, episodic, tearing, diagnosed in the past as cluster headache. History of recurrent sinus infections. Now complains of repeated cough and chest congestion blamed on post nasal drip with mucus especially over the past 6 months, some wheeze, but says "chest stays clear". Postnasal drip with mucus, much drainage. Right ear tinnitus, occasional left ear ache. Has tried various nasal sprays, daily Zyrtec, Sudafed PE. No effect of position or environmental changes. History of seasonal allergic rhinitis, sensitive to cats, got rid of her dog. CT of sinuses was negative. MR brain- Ciari malformation. 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 Not sensitive to aspirin.  Prior to Admission medications   Medication Sig Start Date End Date Taking? Authorizing Provider  albuterol (PROVENTIL HFA;VENTOLIN HFA) 108 (90 BASE) MCG/ACT inhaler Inhale 2 puffs into the lungs every 6 (six) hours as needed for wheezing or shortness of breath. 10/21/13  Yes Rowe Clack, MD  b complex vitamins tablet Take 1 tablet by mouth daily.   Yes Historical Provider, MD  cetirizine (ZYRTEC) 10 MG tablet Take 1 tablet (10 mg total) by mouth daily. 07/15/13  Yes Rowe Clack, MD  Cholecalciferol (VITAMIN D) 2000 UNITS CAPS Take by mouth daily.    Yes Historical Provider, MD  fluticasone (FLONASE) 50 MCG/ACT nasal spray Place 1 spray into both nostrils daily. 01/06/14  Yes Rowe Clack, MD  ibuprofen (ADVIL,MOTRIN) 200 MG tablet Take 200 mg by mouth every 6 (six) hours as needed.   Yes Historical Provider, MD  indomethacin (INDOCIN) 25 MG capsule Take 1pill up to TID. 11/16/13  Yes Adam Melvern Sample, DO  losartan (COZAAR) 100 MG tablet TAKE ONE TABLET BY MOUTH ONCE DAILY 10/01/13  Yes Rowe Clack, MD  meloxicam (MOBIC) 15  MG tablet Take 1 tablet (15 mg total) by mouth daily. 01/06/14  Yes Rowe Clack, MD  Multiple Vitamin (MULTIVITAMIN) tablet Take 1 tablet by mouth daily.     Yes Historical Provider, MD  Omega 3-6-9 Fatty Acids (OMEGA 3-6-9 COMPLEX PO) Take 1 capsule by mouth daily.     Yes Historical Provider, MD  omeprazole (PRILOSEC) 20 MG capsule Take 1 capsule (20 mg total) by mouth daily. 10/21/13  Yes Rowe Clack, MD  azelastine (ASTELIN) 137 MCG/SPRAY nasal spray Place 2 sprays into both nostrils 2 (two) times daily. Use in each nostril as directed 01/06/14   Rowe Clack, MD  FLUoxetine (PROZAC) 20 MG tablet Take 1 tablet (20 mg total) by mouth daily. 10/21/13   Rowe Clack, MD  Fluticasone Furoate-Vilanterol (BREO ELLIPTA) 100-25 MCG/INH AEPB Inhale 1 puff into the lungs daily. 03/11/14   Deneise Lever, MD  ipratropium (ATROVENT) 0.06 % nasal spray 1-2 puffs each nostril up to 4 times daily, if needed 03/11/14   Deneise Lever, MD  NON FORMULARY Garcinoma gummies (otc) take 1 three times a day    Historical Provider, MD   Past Medical History  Diagnosis Date  . ALLERGIC RHINITIS   . DIVERTICULOSIS, COLON   . GERD   . HYPERTENSION   . OBSTRUCTIVE SLEEP APNEA 12/2008 dx    noncompliant with CPAP qhs  . Sciatica     right side  . Chiari malformation     Noted MRI brain 09/2013 - s/p neuro eval for same   Past Surgical History  Procedure Laterality Date  . Abdominal hysterectomy  1986  . Breast surgery  1973    Breast biopsy  . Umbilical hernia repair     Family History  Problem Relation Age of Onset  . Arthritis Mother   . Arthritis Father   . Alcohol abuse Other     parent  . Ataxia Neg Hx   . Chorea Neg Hx   . Dementia Neg Hx   . Mental retardation Neg Hx   . Migraines Neg Hx   . Multiple sclerosis Neg Hx   . Neurofibromatosis Neg Hx   . Neuropathy Neg Hx   . Parkinsonism Neg Hx   . Seizures Neg Hx   . Stroke Neg Hx    History   Social History  .  Marital Status: Married    Spouse Name: N/A    Number of Children: N/A  . Years of Education: N/A   Occupational History  . retired    Social History Main Topics  . Smoking status: Never Smoker   . Smokeless tobacco: Not on file     Comment: Married, lives with spouse, dtr 2 g-kids.  . Alcohol Use: No     Comment: rare  . Drug Use: No  . Sexual Activity: Not on file   Other Topics Concern  . Not on file   Social History Narrative  . No narrative on file   ROS-see HPI Constitutional:   No-   weight loss, night sweats, fevers, chills, fatigue, lassitude. HEENT:   +headaches, difficulty swallowing, tooth/dental problems, sore throat,       No-  sneezing, itching, +ear ache, +nasal congestion, +post nasal drip,  CV:  No-   chest pain, orthopnea, PND, swelling in lower extremities, anasarca,                                  dizziness, palpitations Resp: +shortness of breath with exertion or at rest.              +productive cough,  No non-productive cough,  No- coughing up of blood.              No-   change in color of mucus.  No- wheezing.   Skin: No-   rash or lesions. GI:  No-   heartburn, indigestion, abdominal pain, nausea, vomiting, diarrhea,                 change in bowel habits, loss of appetite GU: No-   dysuria, change in color of urine, no urgency or frequency.  No- flank pain. MS:  No-   joint pain or swelling.  No- decreased range of motion.  No- back pain. Neuro-     nothing unusual Psych:  No- change in mood or affect. No depression or anxiety.  No memory loss.  OBJ- Physical Exam General- Alert, Oriented, Affect-appropriate, Distress- none acute, obese Skin- rash-none, lesions- none, excoriation- none Lymphadenopathy- none Head- atraumatic            Eyes- Gross vision intact, PERRLA, conjunctivae and secretions clear            Ears- Hearing, canals-normal            Nose- Clear, no-Septal dev, mucus, polyps, erosion, perforation             Throat-  Mallampati II , mucosa clear , drainage- none, tonsils- atrophic, +Dentures Neck- flexible ,  trachea midline, no stridor , thyroid nl, carotid no bruit Chest - symmetrical excursion , unlabored           Heart/CV- RRR , no murmur , no gallop  , no rub, nl s1 s2                           - JVD- none , edema- none, stasis changes- none, varices- none           Lung- clear to P&A, wheeze- none, cough- none , dullness-none, rub- none           Chest wall-  Abd- tender-no, distended-no, bowel sounds-present, HSM- no Br/ Gen/ Rectal- Not done, not indicated Extrem- cyanosis- none, clubbing, none, atrophy- none, strength- nl Neuro- grossly intact to observation

## 2014-03-12 LAB — ALLERGY FULL PROFILE
Allergen, D pternoyssinus,d7: 3.24 kU/L — ABNORMAL HIGH
Allergen,Goose feathers, e70: <0.1 kU/L
Alternaria Alternata: <0.1 kU/L
Aspergillus fumigatus, m3: <0.1 kU/L
Bermuda Grass: <0.1 kU/L
Candida Albicans: 0.46 kU/L — ABNORMAL HIGH
Cat Dander: 3.57 kU/L — ABNORMAL HIGH
Common Ragweed: <0.1 kU/L
D. farinae: 6.18 kU/L — ABNORMAL HIGH
Dog Dander: 14 kU/L — ABNORMAL HIGH
ELM IGE: 0.1 kU/L — AB
G005 Rye, Perennial: <0.1 kU/L
Goldenrod: <0.1 kU/L
Helminthosporium halodes: <0.1 kU/L
House Dust Hollister: 8.59 kU/L — ABNORMAL HIGH
Lamb's Quarters: 0.13 kU/L — ABNORMAL HIGH
Plantain: <0.1 kU/L
Stemphylium Botryosum: <0.1 kU/L
Sycamore Tree: <0.1 kU/L
Timothy Grass: <0.1 kU/L

## 2014-03-12 LAB — ALLERGEN FOOD PROFILE SPECIFIC IGE
Apple: <0.1 kU/L
Chicken IgE: <0.1 kU/L
Egg White IgE: 0.54 kU/L — ABNORMAL HIGH
IGE (IMMUNOGLOBULIN E), SERUM: 86 [IU]/mL (ref 0.0–180.0)
Milk IgE: 1.09 kU/L — ABNORMAL HIGH
Orange: <0.1 kU/L
Peanut IgE: 0.37 kU/L — ABNORMAL HIGH
SOYBEAN IGE: 0.4 kU/L — AB
Shrimp IgE: 0.39 kU/L — ABNORMAL HIGH
Tomato IgE: 0.17 kU/L — ABNORMAL HIGH
Tuna IgE: <0.1 kU/L
WHEAT IGE: 1.22 kU/L — AB

## 2014-03-14 DIAGNOSIS — R062 Wheezing: Secondary | ICD-10-CM | POA: Insufficient documentation

## 2014-03-14 DIAGNOSIS — H9319 Tinnitus, unspecified ear: Secondary | ICD-10-CM | POA: Insufficient documentation

## 2014-03-14 DIAGNOSIS — H9311 Tinnitus, right ear: Secondary | ICD-10-CM

## 2014-03-14 HISTORY — DX: Tinnitus, right ear: H93.11

## 2014-03-14 NOTE — Assessment & Plan Note (Signed)
Left frontotemporal headache area atypical for sinus-related problem. CT of sinuses was negative. Plan-lab for allergy profile, try ipratropium nasal spray

## 2014-03-14 NOTE — Assessment & Plan Note (Signed)
Uncertain relation to atypical headache on the same side. Asymmetry with complaint of left ear pain and right ear tinnitus. Eustachian dysfunction is possible but need to better clarify rhinitis/allergy status

## 2014-03-14 NOTE — Assessment & Plan Note (Signed)
She gave some conflicting responses but does describe wheeze, suggesting possibility of asthma or asthmatic bronchitis. To help her better define symptoms in her own mind, we are giving sample Anne Barnes for trial

## 2014-03-14 NOTE — Assessment & Plan Note (Signed)
History of seasonal allergic rhinitis and sensitivity to cats. Now describing perennial symptoms over past 6 months. Plan-allergy profile, ipratropium nasal spray

## 2014-04-22 ENCOUNTER — Encounter (INDEPENDENT_AMBULATORY_CARE_PROVIDER_SITE_OTHER): Payer: Self-pay

## 2014-04-22 ENCOUNTER — Ambulatory Visit (INDEPENDENT_AMBULATORY_CARE_PROVIDER_SITE_OTHER): Payer: Commercial Managed Care - HMO | Admitting: Internal Medicine

## 2014-04-22 ENCOUNTER — Encounter: Payer: Self-pay | Admitting: Internal Medicine

## 2014-04-22 VITALS — BP 160/90 | HR 56 | Ht 60.0 in | Wt 228.4 lb

## 2014-04-22 DIAGNOSIS — J302 Other seasonal allergic rhinitis: Secondary | ICD-10-CM

## 2014-04-22 DIAGNOSIS — L500 Allergic urticaria: Secondary | ICD-10-CM

## 2014-04-22 DIAGNOSIS — Z91018 Allergy to other foods: Secondary | ICD-10-CM

## 2014-04-22 DIAGNOSIS — J3089 Other allergic rhinitis: Principal | ICD-10-CM

## 2014-04-22 DIAGNOSIS — J309 Allergic rhinitis, unspecified: Secondary | ICD-10-CM

## 2014-04-22 MED ORDER — MONTELUKAST SODIUM 10 MG PO TABS: 10.0000 mg | ORAL_TABLET | Freq: Every day | ORAL | Status: DC

## 2014-04-22 MED ORDER — FLUTICASONE FUROATE-VILANTEROL 100-25 MCG/INH IN AEPB: INHALATION_SPRAY | RESPIRATORY_TRACT | Status: DC

## 2014-04-22 NOTE — Patient Instructions (Signed)
Script sent for [employer]  Daily maintenance inhaler  Script sent for singulair daily airway anti-inflammatory  Ok also to take an otc antihistamine like Zyrtec/cetirizine or Allegra/ fexofenadine as needed. These may prevent flare ups if you are exposed to dust or cats, and may also help with hives.

## 2014-04-22 NOTE — Progress Notes (Signed)
03/11/14- 67 yoF never smoker  COMPLAINS OF:  Referred by Dr. Uvaldo Bristle of pressure in her sinuses.  sinus drainage.  Seen by Dr Gwenette Greet for OSA Left frontal/temporal pressure discomfort, episodic, tearing, diagnosed in the past as cluster headache. History of recurrent sinus infections. Now complains of repeated cough and chest congestion blamed on post nasal drip with mucus especially over the past 6 months, some wheeze, but says "chest stays clear". Postnasal drip with mucus, much drainage. Right ear tinnitus, occasional left ear ache. Has tried various nasal sprays, daily Zyrtec, Sudafed PE. No effect of position or environmental changes. History of seasonal allergic rhinitis, sensitive to cats, got rid of her dog. CT of sinuses was negative. MR brain- Ciari malformation. 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 Not sensitive to aspirin.  04/22/14- 67 yoF never smoker  Followed for rhinitis/ allergy, asthma Brio Ellipta is helping chest Cough increases pressure in her face but is mostly dry Hives are generally idiopathic; possibly related to foods, sun, heat, cold or viruses. Use OTC benadryl, call if symptoms persist or worsen. Can try other antihistamines or possibly H2 blockers later prn. Come and go on chest. Peanut and cat cause coughing and we advised her to avoid him Allergy profile 03/11/14- positive especially for dust, cat and dog Food allergy profile- total IgE 86 with several food group elevations Sed rate-WNL  ROS-see HPI Constitutional:   No-   weight loss, night sweats, fevers, chills, fatigue, lassitude. HEENT:   +headaches, difficulty swallowing, tooth/dental problems, sore throat,       No-  sneezing, itching, +ear ache, +nasal congestion, +post nasal drip,  CV:  No-   chest pain, orthopnea, PND, swelling in lower extremities, anasarca,  dizziness, palpitations Resp: +shortness of breath with exertion or at rest.              +productive cough,  No non-productive  cough,  No- coughing up of blood.              No-   change in color of mucus.  No- wheezing.   Skin: No-   rash or lesions. GI:  No-   heartburn, indigestion, abdominal pain, nausea, vomiting,  GU: . MS:  No-   joint pain or swelling.   Neuro-     nothing unusual Psych:  No- change in mood or affect. No depression or anxiety.  No memory loss.  OBJ- Physical Exam General- Alert, Oriented, Affect-appropriate, Distress- none acute, obese Skin- rash-none, lesions- none, excoriation- none Lymphadenopathy- none Head- atraumatic            Eyes- Gross vision intact, PERRLA, conjunctivae and secretions clear            Ears- Hearing, canals-normal            Nose- + turbinate edema, no-Septal dev, mucus, polyps, erosion, perforation             Throat- Mallampati II , mucosa clear , drainage- none, tonsils- atrophic, +Dentures Neck- flexible , trachea midline, no stridor , thyroid nl, carotid no bruit Chest - symmetrical excursion , unlabored           Heart/CV- RRR , no murmur , no gallop  , no rub, nl s1 s2                           - JVD- none , edema- none, stasis changes- none, varices- none  Lung- clear to P&A, wheeze- none, cough- none , dullness-none, rub- none           Chest wall-  Abd- tender-no, distended-no, bowel sounds-present, HSM- no Br/ Gen/ Rectal- Not done, not indicated Extrem- cyanosis- none, clubbing, none, atrophy- none, strength- nl Neuro- grossly intact to observation

## 2014-05-10 ENCOUNTER — Other Ambulatory Visit: Payer: Self-pay

## 2014-05-10 DIAGNOSIS — Z1231 Encounter for screening mammogram for malignant neoplasm of breast: Secondary | ICD-10-CM

## 2014-05-20 ENCOUNTER — Ambulatory Visit: Payer: Commercial Managed Care - HMO

## 2014-05-28 ENCOUNTER — Emergency Department (HOSPITAL_BASED_OUTPATIENT_CLINIC_OR_DEPARTMENT_OTHER): Payer: Medicare HMO

## 2014-05-28 ENCOUNTER — Encounter (HOSPITAL_BASED_OUTPATIENT_CLINIC_OR_DEPARTMENT_OTHER): Payer: Self-pay | Admitting: Emergency Medicine

## 2014-05-28 ENCOUNTER — Ambulatory Visit: Payer: Commercial Managed Care - HMO | Admitting: Internal Medicine

## 2014-05-28 ENCOUNTER — Emergency Department (HOSPITAL_BASED_OUTPATIENT_CLINIC_OR_DEPARTMENT_OTHER)
Admission: EM | Admit: 2014-05-28 | Discharge: 2014-05-28 | Disposition: A | Discharge status: Home / Self Care | Payer: Medicare HMO | Attending: Emergency Medicine | Admitting: Emergency Medicine

## 2014-05-28 DIAGNOSIS — R52 Pain, unspecified: Secondary | ICD-10-CM | POA: Diagnosis not present

## 2014-05-28 DIAGNOSIS — Z79899 Other long term (current) drug therapy: Secondary | ICD-10-CM | POA: Insufficient documentation

## 2014-05-28 DIAGNOSIS — Z791 Long term (current) use of non-steroidal anti-inflammatories (NSAID): Secondary | ICD-10-CM | POA: Diagnosis not present

## 2014-05-28 DIAGNOSIS — IMO0002 Reserved for concepts with insufficient information to code with codable children: Secondary | ICD-10-CM | POA: Diagnosis not present

## 2014-05-28 DIAGNOSIS — G4733 Obstructive sleep apnea (adult) (pediatric): Secondary | ICD-10-CM | POA: Diagnosis not present

## 2014-05-28 DIAGNOSIS — Z8739 Personal history of other diseases of the musculoskeletal system and connective tissue: Secondary | ICD-10-CM | POA: Insufficient documentation

## 2014-05-28 DIAGNOSIS — K219 Gastro-esophageal reflux disease without esophagitis: Secondary | ICD-10-CM | POA: Insufficient documentation

## 2014-05-28 DIAGNOSIS — Q054 Unspecified spina bifida with hydrocephalus: Secondary | ICD-10-CM | POA: Insufficient documentation

## 2014-05-28 DIAGNOSIS — Z9981 Dependence on supplemental oxygen: Secondary | ICD-10-CM | POA: Diagnosis not present

## 2014-05-28 DIAGNOSIS — I10 Essential (primary) hypertension: Secondary | ICD-10-CM | POA: Insufficient documentation

## 2014-05-28 DIAGNOSIS — M25569 Pain in unspecified knee: Secondary | ICD-10-CM | POA: Diagnosis present

## 2014-05-28 DIAGNOSIS — Z8709 Personal history of other diseases of the respiratory system: Secondary | ICD-10-CM | POA: Diagnosis not present

## 2014-05-28 DIAGNOSIS — M25562 Pain in left knee: Secondary | ICD-10-CM

## 2014-05-28 MED ORDER — HYDROCODONE-ACETAMINOPHEN 5-325 MG PO TABS
1.0000 | ORAL_TABLET | Freq: Once | ORAL | Status: AC
  Administered 2014-05-28: 1 via ORAL
  Filled 2014-05-28: qty 1

## 2014-05-28 MED ORDER — HYDROCODONE-ACETAMINOPHEN 5-325 MG PO TABS: 1.0000 | ORAL_TABLET | ORAL | Status: DC | PRN

## 2014-05-28 NOTE — ED Notes (Signed)
Left knee pain x 2 days. Mild swelling noted. Denies injury

## 2014-05-28 NOTE — ED Provider Notes (Signed)
Medical screening examination/treatment/procedure(s) were performed by non-physician practitioner and as supervising physician I was immediately available for consultation/collaboration.   EKG Interpretation None        Merryl Hacker, MD 05/28/14 [id number]

## 2014-05-28 NOTE — ED Notes (Signed)
Patient transported to X-ray 

## 2014-05-28 NOTE — ED Provider Notes (Signed)
CSN: [id number]     Arrival date & time 05/28/14  1131 History   First MD Initiated Contact with Patient 05/28/14 1202     Chief Complaint  Patient presents with  . Knee Pain     (Consider location/radiation/quality/duration/timing/severity/associated sxs/prior Treatment) Patient is a 68 y.o. female presenting with knee pain. The history is provided by the patient. No language interpreter was used.  Knee Pain Location:  Knee Knee location:  L knee Pain details:    Quality:  Aching and burning Associated symptoms: no fever   Associated symptoms comment:  Left knee pain and swelling that is recurrent, some worse with this exacerbation. No direct injury. No calf or thigh pain. No redness or fever.    Past Medical History  Diagnosis Date  . ALLERGIC RHINITIS   . DIVERTICULOSIS, COLON   . GERD   . HYPERTENSION   . OBSTRUCTIVE SLEEP APNEA 12/2008 dx    noncompliant with CPAP qhs  . Sciatica     right side  . Chiari malformation     Noted MRI brain 09/2013 - s/p neuro eval for same   Past Surgical History  Procedure Laterality Date  . Abdominal hysterectomy  1986  . Breast surgery  1973    Breast biopsy  . Umbilical hernia repair     Family History  Problem Relation Age of Onset  . Arthritis Mother   . Arthritis Father   . Alcohol abuse Other     parent  . Ataxia Neg Hx   . Chorea Neg Hx   . Dementia Neg Hx   . Mental retardation Neg Hx   . Migraines Neg Hx   . Multiple sclerosis Neg Hx   . Neurofibromatosis Neg Hx   . Neuropathy Neg Hx   . Parkinsonism Neg Hx   . Seizures Neg Hx   . Stroke Neg Hx    History  Substance Use Topics  . Smoking status: Never Smoker   . Smokeless tobacco: Not on file     Comment: Married, lives with spouse, dtr 2 g-kids.  . Alcohol Use: No     Comment: rare   OB History   Grav Para Term Preterm Abortions TAB SAB Ect Mult Living                 Review of Systems  Constitutional: Negative for fever and chills.  Respiratory:  Negative.   Cardiovascular: Negative.   Gastrointestinal: Negative.   Musculoskeletal: Positive for arthralgias.       See HPI.  Skin: Negative.   Neurological: Negative.  Negative for numbness.      Allergies  Codeine and Lisinopril-hydrochlorothiazide  Home Medications   Prior to Admission medications   Medication Sig Start Date End Date Taking? Authorizing Provider  albuterol (PROVENTIL HFA;VENTOLIN HFA) 108 (90 BASE) MCG/ACT inhaler Inhale 2 puffs into the lungs every 6 (six) hours as needed for wheezing or shortness of breath. 10/21/13   Rowe Clack, MD  b complex vitamins tablet Take 1 tablet by mouth daily.    Historical Provider, MD  cetirizine (ZYRTEC) 10 MG tablet Take 1 tablet (10 mg total) by mouth daily. 07/15/13   Rowe Clack, MD  Cholecalciferol (VITAMIN D) 2000 UNITS CAPS Take by mouth daily.     Historical Provider, MD  FLUoxetine (PROZAC) 20 MG tablet Take 1 tablet (20 mg total) by mouth daily. 10/21/13   Rowe Clack, MD  Fluticasone Furoate-Vilanterol (BREO ELLIPTA) 100-25 MCG/INH AEPB Inhale 1  puff into the lungs daily. 03/11/14   Deneise Lever, MD  Fluticasone Furoate-Vilanterol (BREO ELLIPTA) 100-25 MCG/INH AEPB 1 puff and rinse mouth, once daily- maintenance 04/22/14   Deneise Lever, MD  ibuprofen (ADVIL,MOTRIN) 200 MG tablet Take 200 mg by mouth every 6 (six) hours as needed.    Historical Provider, MD  ipratropium (ATROVENT) 0.06 % nasal spray 1-2 puffs each nostril up to 4 times daily, if needed 03/11/14   Deneise Lever, MD  losartan (COZAAR) 100 MG tablet TAKE ONE TABLET BY MOUTH ONCE DAILY 10/01/13   Rowe Clack, MD  meloxicam (MOBIC) 15 MG tablet Take 1 tablet (15 mg total) by mouth daily. 01/06/14   Rowe Clack, MD  montelukast (SINGULAIR) 10 MG tablet Take 1 tablet (10 mg total) by mouth at bedtime. 04/22/14   Deneise Lever, MD  Multiple Vitamin (MULTIVITAMIN) tablet Take 1 tablet by mouth daily.      Historical Provider,  MD  Omega 3-6-9 Fatty Acids (OMEGA 3-6-9 COMPLEX PO) Take 1 capsule by mouth daily.      Historical Provider, MD  omeprazole (PRILOSEC) 20 MG capsule Take 1 capsule (20 mg total) by mouth daily. 10/21/13   Rowe Clack, MD   BP 191/84  Pulse 78  Temp(Src) 98.1 F (36.7 C) (Oral)  Resp 16  Ht 5\' 1"  (1.549 m)  Wt 226 lb (102.513 kg)  BMI 42.72 kg/m2  SpO2 100% Physical Exam  Constitutional: She is oriented to person, place, and time. She appears well-developed and well-nourished. No distress.  Cardiovascular: Intact distal pulses.   Musculoskeletal:  Left knee has moderate swelling without redness or warmth. FROM. Joint stable. Positive McMurrays on valgus positioning. Calf and thigh non-tender.   Neurological: She is alert and oriented to person, place, and time.  Skin: Skin is warm and dry. No erythema.  Psychiatric: She has a normal mood and affect.    ED Course  Procedures (including critical care time) Labs Review Labs Reviewed - No data to display  Imaging Review Dg Knee 1-2 Views Left  05/28/2014   CLINICAL DATA:  Chronic left medial knee pain.  No injury.  EXAM: LEFT KNEE - 1-2 VIEW  COMPARISON:  None.  FINDINGS: Early joint space narrowing and spurring in the medial and patellofemoral compartments. No acute bony abnormality. Specifically, no fracture, subluxation, or dislocation. Soft tissues are intact. No joint effusion.  IMPRESSION: No acute bony abnormality.   Electronically Signed   By: Rolm Baptise M.D.   On: 05/28/2014 12:16     EKG Interpretation None      MDM   Final diagnoses:  None    1. Left knee pain, recurrent  Knee immobilizer provided. Encouraged PCP follow up to discuss MRI of the knee if pain and swelling persist.     Dewaine Oats, PA-C 05/28/14 1238

## 2014-05-28 NOTE — Discharge Instructions (Signed)
Arthralgia °Your caregiver has diagnosed you as suffering from an arthralgia. Arthralgia means there is pain in a joint. This can come from many reasons including: °· Bruising the joint which causes soreness (inflammation) in the joint. °· Wear and tear on the joints which occur as we grow older (osteoarthritis). °· Overusing the joint. °· Various forms of arthritis. °· Infections of the joint. °Regardless of the cause of pain in your joint, most of these different pains respond to anti-inflammatory drugs and rest. The exception to this is when a joint is infected, and these cases are treated with antibiotics, if it is a bacterial infection. °HOME CARE INSTRUCTIONS  °· Rest the injured area for as long as directed by your caregiver. Then slowly start using the joint as directed by your caregiver and as the pain allows. Crutches as directed may be useful if the ankles, knees or hips are involved. If the knee was splinted or casted, continue use and care as directed. If an stretchy or elastic wrapping bandage has been applied today, it should be removed and re-applied every 3 to 4 hours. It should not be applied tightly, but firmly enough to keep swelling down. Watch toes and feet for swelling, bluish discoloration, coldness, numbness or excessive pain. If any of these problems (symptoms) occur, remove the ace bandage and re-apply more loosely. If these symptoms persist, contact your caregiver or return to this location. °· For the first 24 hours, keep the injured extremity elevated on pillows while lying down. °· Apply ice for 15-20 minutes to the sore joint every couple hours while awake for the first half day. Then 03-04 times per day for the first 48 hours. Put the ice in a plastic bag and place a towel between the bag of ice and your skin. °· Wear any splinting, casting, elastic bandage applications, or slings as instructed. °· Only take over-the-counter or prescription medicines for pain, discomfort, or fever as  directed by your caregiver. Do not use aspirin immediately after the injury unless instructed by your physician. Aspirin can cause increased bleeding and bruising of the tissues. °· If you were given crutches, continue to use them as instructed and do not resume weight bearing on the sore joint until instructed. °Persistent pain and inability to use the sore joint as directed for more than 2 to 3 days are warning signs indicating that you should see a caregiver for a follow-up visit as soon as possible. Initially, a hairline fracture (break in bone) may not be evident on X-rays. Persistent pain and swelling indicate that further evaluation, non-weight bearing or use of the joint (use of crutches or slings as instructed), or further X-rays are indicated. X-rays may sometimes not show a small fracture until a week or 10 days later. Make a follow-up appointment with your own caregiver or one to whom we have referred you. A radiologist (specialist in reading X-rays) may read your X-rays. Make sure you know how you are to obtain your X-ray results. Do not assume everything is normal if you do not hear from us. °SEEK MEDICAL CARE IF: °Bruising, swelling, or pain increases. °SEEK IMMEDIATE MEDICAL CARE IF:  °· Your fingers or toes are numb or blue. °· The pain is not responding to medications and continues to stay the same or get worse. °· The pain in your joint becomes severe. °· You develop a fever over 102° F (38.9° C). °· It becomes impossible to move or use the joint. °MAKE SURE YOU:  °·   Understand these instructions.  Will watch your condition.  Will get help right away if you are not doing well or get worse. Document Released: 09/10/2005 Document Revised: 12/03/2011 Document Reviewed: 04/28/2008 [facility] Patient Information 2015 [place], [place]. This information is not intended to replace advice given to you by your health care provider. Make sure you discuss any questions you have with your health care  provider.  Cryotherapy Cryotherapy means treatment with cold. Ice or gel packs can be used to reduce both pain and swelling. Ice is the most helpful within the first 24 to 48 hours after an injury or flare-up from overusing a muscle or joint. Sprains, strains, spasms, burning pain, shooting pain, and aches can all be eased with ice. Ice can also be used when recovering from surgery. Ice is effective, has very few side effects, and is safe for most people to use. PRECAUTIONS  Ice is not a safe treatment option for people with:  Raynaud phenomenon. This is a condition affecting small blood vessels in the extremities. Exposure to cold may cause your problems to return.  Cold hypersensitivity. There are many forms of cold hypersensitivity, including:  Cold urticaria. Red, itchy hives appear on the skin when the tissues begin to warm after being iced.  Cold erythema. This is a red, itchy rash caused by exposure to cold.  Cold hemoglobinuria. Red blood cells break down when the tissues begin to warm after being iced. The hemoglobin that carry oxygen are passed into the urine because they cannot combine with blood proteins fast enough.  Numbness or altered sensitivity in the area being iced. If you have any of the following conditions, do not use ice until you have discussed cryotherapy with your caregiver:  Heart conditions, such as arrhythmia, angina, or chronic heart disease.  High blood pressure.  Healing wounds or open skin in the area being iced.  Current infections.  Rheumatoid arthritis.  Poor circulation.  Diabetes. Ice slows the blood flow in the region it is applied. This is beneficial when trying to stop inflamed tissues from spreading irritating chemicals to surrounding tissues. However, if you expose your skin to cold temperatures for too long or without the proper protection, you can damage your skin or nerves. Watch for signs of skin damage due to cold. HOME CARE  INSTRUCTIONS Follow these tips to use ice and cold packs safely.  Place a dry or damp towel between the ice and skin. A damp towel will cool the skin more quickly, so you may need to shorten the time that the ice is used.  For a more rapid response, add gentle compression to the ice.  Ice for no more than 10 to 20 minutes at a time. The bonier the area you are icing, the less time it will take to get the benefits of ice.  Check your skin after 5 minutes to make sure there are no signs of a poor response to cold or skin damage.  Rest 20 minutes or more between uses.  Once your skin is numb, you can end your treatment. You can test numbness by very lightly touching your skin. The touch should be so light that you do not see the skin dimple from the pressure of your fingertip. When using ice, most people will feel these normal sensations in this order: cold, burning, aching, and numbness.  Do not use ice on someone who cannot communicate their responses to pain, such as small children or people with dementia. HOW TO MAKE  AN ICE PACK Ice packs are the most common way to use ice therapy. Other methods include ice massage, ice baths, and cryosprays. Muscle creams that cause a cold, tingly feeling do not offer the same benefits that ice offers and should not be used as a substitute unless recommended by your caregiver. To make an ice pack, do one of the following:  Place crushed ice or a bag of frozen vegetables in a sealable plastic bag. Squeeze out the excess air. Place this bag inside another plastic bag. Slide the bag into a pillowcase or place a damp towel between your skin and the bag.  Mix 3 parts water with 1 part rubbing alcohol. Freeze the mixture in a sealable plastic bag. When you remove the mixture from the freezer, it will be slushy. Squeeze out the excess air. Place this bag inside another plastic bag. Slide the bag into a pillowcase or place a damp towel between your skin and the  bag. SEEK MEDICAL CARE IF:  You develop white spots on your skin. This may give the skin a blotchy (mottled) appearance.  Your skin turns blue or pale.  Your skin becomes waxy or hard.  Your swelling gets worse. MAKE SURE YOU:   Understand these instructions.  Will watch your condition.  Will get help right away if you are not doing well or get worse. Document Released: 05/07/2011 Document Revised: 01/25/2014 Document Reviewed: 05/07/2011 [facility] Patient Information 2015 [place], [place]. This information is not intended to replace advice given to you by your health care provider. Make sure you discuss any questions you have with your health care provider.

## 2014-06-01 ENCOUNTER — Ambulatory Visit: Payer: Medicare HMO | Admitting: Neurology

## 2014-06-10 ENCOUNTER — Other Ambulatory Visit: Payer: Self-pay | Admitting: Internal Medicine

## 2014-06-11 ENCOUNTER — Inpatient Hospital Stay: Admission: RE | Admit: 2014-06-11 | Payer: Commercial Managed Care - HMO | Source: Ambulatory Visit

## 2014-08-01 DIAGNOSIS — Z91018 Allergy to other foods: Secondary | ICD-10-CM | POA: Insufficient documentation

## 2014-08-01 DIAGNOSIS — L500 Allergic urticaria: Secondary | ICD-10-CM | POA: Insufficient documentation

## 2014-08-01 HISTORY — DX: Allergic urticaria: L50.0

## 2014-08-01 NOTE — Assessment & Plan Note (Signed)
Discussed food allergy profile and history that peanuts trigger cough. Plan-use the food allergy list as a "watch" list, avoiding those foods which actually cause symptoms. Watch association to hives.

## 2014-08-01 NOTE — Assessment & Plan Note (Signed)
Educated environmental precautions. Discussed antihistamines

## 2014-08-01 NOTE — Assessment & Plan Note (Signed)
Hives come and go on chest Plan-educated antihistamines, especially Zyrtec, Allegra, and pay attention to food and exposure triggers

## 2014-08-23 ENCOUNTER — Ambulatory Visit: Payer: Commercial Managed Care - HMO | Admitting: Internal Medicine

## 2014-10-22 ENCOUNTER — Other Ambulatory Visit: Payer: Self-pay | Admitting: Internal Medicine

## 2014-11-11 ENCOUNTER — Other Ambulatory Visit: Payer: Self-pay | Admitting: Internal Medicine

## 2015-01-10 ENCOUNTER — Emergency Department (HOSPITAL_BASED_OUTPATIENT_CLINIC_OR_DEPARTMENT_OTHER)
Admission: EM | Admit: 2015-01-10 | Discharge: 2015-01-10 | Disposition: A | Discharge status: Home / Self Care | Payer: Commercial Managed Care - HMO | Attending: Emergency Medicine | Admitting: Emergency Medicine

## 2015-01-10 ENCOUNTER — Encounter (HOSPITAL_BASED_OUTPATIENT_CLINIC_OR_DEPARTMENT_OTHER): Payer: Self-pay

## 2015-01-10 ENCOUNTER — Emergency Department (HOSPITAL_BASED_OUTPATIENT_CLINIC_OR_DEPARTMENT_OTHER): Payer: Commercial Managed Care - HMO

## 2015-01-10 DIAGNOSIS — Z7952 Long term (current) use of systemic steroids: Secondary | ICD-10-CM | POA: Diagnosis not present

## 2015-01-10 DIAGNOSIS — Z79899 Other long term (current) drug therapy: Secondary | ICD-10-CM | POA: Diagnosis not present

## 2015-01-10 DIAGNOSIS — Q0700 Arnold-Chiari syndrome without spina bifida or hydrocephalus: Secondary | ICD-10-CM | POA: Insufficient documentation

## 2015-01-10 DIAGNOSIS — R05 Cough: Secondary | ICD-10-CM | POA: Diagnosis not present

## 2015-01-10 DIAGNOSIS — Z7951 Long term (current) use of inhaled steroids: Secondary | ICD-10-CM | POA: Diagnosis not present

## 2015-01-10 DIAGNOSIS — J209 Acute bronchitis, unspecified: Secondary | ICD-10-CM | POA: Diagnosis not present

## 2015-01-10 DIAGNOSIS — R079 Chest pain, unspecified: Secondary | ICD-10-CM | POA: Diagnosis not present

## 2015-01-10 DIAGNOSIS — Z791 Long term (current) use of non-steroidal anti-inflammatories (NSAID): Secondary | ICD-10-CM | POA: Insufficient documentation

## 2015-01-10 DIAGNOSIS — Z8669 Personal history of other diseases of the nervous system and sense organs: Secondary | ICD-10-CM | POA: Insufficient documentation

## 2015-01-10 DIAGNOSIS — K219 Gastro-esophageal reflux disease without esophagitis: Secondary | ICD-10-CM | POA: Diagnosis not present

## 2015-01-10 DIAGNOSIS — R509 Fever, unspecified: Secondary | ICD-10-CM | POA: Diagnosis not present

## 2015-01-10 DIAGNOSIS — Z8739 Personal history of other diseases of the musculoskeletal system and connective tissue: Secondary | ICD-10-CM | POA: Insufficient documentation

## 2015-01-10 DIAGNOSIS — I10 Essential (primary) hypertension: Secondary | ICD-10-CM | POA: Insufficient documentation

## 2015-01-10 DIAGNOSIS — R059 Cough, unspecified: Secondary | ICD-10-CM

## 2015-01-10 LAB — CBC WITH DIFFERENTIAL/PLATELET
BASOS PCT: 0 % (ref 0–1)
Basophils Absolute: 0 10*3/uL (ref 0.0–0.1)
Eosinophils Absolute: 0.1 10*3/uL (ref 0.0–0.7)
Eosinophils Relative: 1 % (ref 0–5)
HCT: 42.8 % (ref 36.0–46.0)
HEMOGLOBIN: 13.8 g/dL (ref 12.0–15.0)
LYMPHS ABS: 0.7 10*3/uL (ref 0.7–4.0)
Lymphocytes Relative: 9 % — ABNORMAL LOW (ref 12–46)
MCH: 29.6 pg (ref 26.0–34.0)
MCHC: 32.2 g/dL (ref 30.0–36.0)
MCV: 91.8 fL (ref 78.0–100.0)
MONOS PCT: 19 % — AB (ref 3–12)
Monocytes Absolute: 1.5 10*3/uL — ABNORMAL HIGH (ref 0.1–1.0)
NEUTROS ABS: 5.7 10*3/uL (ref 1.7–7.7)
NEUTROS PCT: 71 % (ref 43–77)
Platelets: 207 10*3/uL (ref 150–400)
RBC: 4.66 MIL/uL (ref 3.87–5.11)
RDW: 13.5 % (ref 11.5–15.5)
WBC: 8 10*3/uL (ref 4.0–10.5)

## 2015-01-10 LAB — URINALYSIS, ROUTINE W REFLEX MICROSCOPIC
Bilirubin Urine: NEGATIVE
GLUCOSE, UA: NEGATIVE mg/dL
HGB URINE DIPSTICK: NEGATIVE
KETONES UR: NEGATIVE mg/dL
Leukocytes, UA: NEGATIVE
Nitrite: NEGATIVE
PROTEIN: 30 mg/dL — AB
Specific Gravity, Urine: 1.017 (ref 1.005–1.030)
UROBILINOGEN UA: 1 mg/dL (ref 0.0–1.0)
pH: 8.5 — ABNORMAL HIGH (ref 5.0–8.0)

## 2015-01-10 LAB — COMPREHENSIVE METABOLIC PANEL
ALT: 17 U/L (ref 0–35)
AST: 21 U/L (ref 0–37)
Albumin: 4.2 g/dL (ref 3.5–5.2)
Alkaline Phosphatase: 79 U/L (ref 39–117)
Anion gap: 7 (ref 5–15)
BUN: 17 mg/dL (ref 6–23)
CALCIUM: 8.8 mg/dL (ref 8.4–10.5)
CHLORIDE: 102 mmol/L (ref 96–112)
CO2: 30 mmol/L (ref 19–32)
Creatinine, Ser: 0.9 mg/dL (ref 0.50–1.10)
GFR calc non Af Amer: 65 mL/min — ABNORMAL LOW (ref >90)
GFR, EST AFRICAN AMERICAN: 75 mL/min — AB (ref >90)
GLUCOSE: 103 mg/dL — AB (ref 70–99)
Potassium: 3.9 mmol/L (ref 3.5–5.1)
Sodium: 139 mmol/L (ref 135–145)
TOTAL PROTEIN: 7.9 g/dL (ref 6.0–8.3)
Total Bilirubin: 0.7 mg/dL (ref 0.3–1.2)

## 2015-01-10 LAB — URINE MICROSCOPIC-ADD ON

## 2015-01-10 LAB — TROPONIN I

## 2015-01-10 LAB — APTT: APTT: 32 s (ref 24–37)

## 2015-01-10 LAB — I-STAT CG4 LACTIC ACID, ED: Lactic Acid, Venous: 1.39 mmol/L (ref 0.5–2.0)

## 2015-01-10 LAB — PROTIME-INR
INR: 1.06 (ref 0.00–1.49)
Prothrombin Time: 13.8 seconds (ref 11.6–15.2)

## 2015-01-10 LAB — BRAIN NATRIURETIC PEPTIDE: B Natriuretic Peptide: 39.9 pg/mL (ref 0.0–100.0)

## 2015-01-10 MED ORDER — DEXTROSE 5 % IV SOLN: 500.0000 mg | INTRAVENOUS | Status: DC

## 2015-01-10 MED ORDER — AZITHROMYCIN 250 MG PO TABS: ORAL_TABLET | ORAL | Status: DC

## 2015-01-10 MED ORDER — DEXTROSE 5 % IV SOLN: 1.0000 g | INTRAVENOUS | Status: DC

## 2015-01-10 MED ORDER — SODIUM CHLORIDE 0.9 % IV BOLUS (SEPSIS): 500.0000 mL | INTRAVENOUS | Status: DC

## 2015-01-10 MED ORDER — BENZONATATE 100 MG PO CAPS: 100.0000 mg | ORAL_CAPSULE | Freq: Three times a day (TID) | ORAL | Status: DC

## 2015-01-10 MED ORDER — LOSARTAN POTASSIUM 50 MG PO TABS
100.0000 mg | ORAL_TABLET | Freq: Once | ORAL | Status: DC
  Filled 2015-01-10: qty 2

## 2015-01-10 MED ORDER — PREDNISONE 20 MG PO TABS: 40.0000 mg | ORAL_TABLET | Freq: Every day | ORAL | Status: DC

## 2015-01-10 MED ORDER — CEFTRIAXONE SODIUM 1 G IJ SOLR
1.0000 g | Freq: Once | INTRAMUSCULAR | Status: AC
  Administered 2015-01-10: 1 g via INTRAVENOUS

## 2015-01-10 MED ORDER — ACETAMINOPHEN 325 MG PO TABS
650.0000 mg | ORAL_TABLET | Freq: Once | ORAL | Status: AC
  Administered 2015-01-10: 650 mg via ORAL
  Filled 2015-01-10: qty 2

## 2015-01-10 MED ORDER — DEXTROSE 5 % IV SOLN
500.0000 mg | Freq: Once | INTRAVENOUS | Status: AC
  Administered 2015-01-10: 500 mg via INTRAVENOUS
  Filled 2015-01-10: qty 500

## 2015-01-10 MED ORDER — CEFTRIAXONE SODIUM 1 G IJ SOLR
INTRAMUSCULAR | Status: AC
Start: 2015-01-10 — End: 2015-01-10
  Filled 2015-01-10: qty 10

## 2015-01-10 MED ORDER — SODIUM CHLORIDE 0.9 % IV BOLUS (SEPSIS)
1000.0000 mL | INTRAVENOUS | Status: AC
  Administered 2015-01-10: 1000 mL via INTRAVENOUS

## 2015-01-10 NOTE — ED Notes (Signed)
PA at bedside.

## 2015-01-10 NOTE — ED Notes (Signed)
Patient here with cough and fever x 3 days. Reports weakness with same. Also general headache

## 2015-01-10 NOTE — ED Provider Notes (Signed)
CSN: [id number]     Arrival date & time 01/10/15  1011 History   First MD Initiated Contact with Patient 01/10/15 1029     Chief Complaint  Patient presents with  . cough, fever      (Consider location/radiation/quality/duration/timing/severity/associated sxs/prior Treatment) HPI   SHANDELL GIOVANNI is a 69 y.o. female complaining of fever, productive cough, pleuritic chest pain generalized fatigue, 5 out of 10 frontal headache worsening over the course of 5 days. Positive sick contacts: Patient grandchild and daughter whom she lives with of had an upper respiratory infection however they did not have fever and fatigue like she is developed over the course of the last 2 days. Patient denies rhinorrhea above her typical allergic rhinitis. She states that her headache is typical for her sinus pressure secondary to allergies. He denies nausea, vomiting, cervicalgia, rash, abdominal pain, shortness of breath, change in urination, diarrhea, sore throat. She's not been hospitalized in the last 3 months. No recent antibiotics. No flu shot this year. Patient states that she did not have her high blood pressure medication this morning states she was slightly confused she attributes this to the fever.  Past Medical History  Diagnosis Date  . ALLERGIC RHINITIS   . DIVERTICULOSIS, COLON   . GERD   . HYPERTENSION   . OBSTRUCTIVE SLEEP APNEA 12/2008 dx    noncompliant with CPAP qhs  . Sciatica     right side  . Chiari malformation     Noted MRI brain 09/2013 - s/p neuro eval for same   Past Surgical History  Procedure Laterality Date  . Abdominal hysterectomy  1986  . Breast surgery  1973    Breast biopsy  . Umbilical hernia repair     Family History  Problem Relation Age of Onset  . Arthritis Mother   . Arthritis Father   . Alcohol abuse Other     parent  . Ataxia Neg Hx   . Chorea Neg Hx   . Dementia Neg Hx   . Mental retardation Neg Hx   . Migraines Neg Hx   . Multiple sclerosis Neg Hx    . Neurofibromatosis Neg Hx   . Neuropathy Neg Hx   . Parkinsonism Neg Hx   . Seizures Neg Hx   . Stroke Neg Hx    History  Substance Use Topics  . Smoking status: Never Smoker   . Smokeless tobacco: Not on file     Comment: Married, lives with spouse, dtr 2 g-kids.  . Alcohol Use: No     Comment: rare   OB History    No data available     Review of Systems  10 systems reviewed and found to be negative, except as noted in the HPI.   Allergies  Codeine and Lisinopril-hydrochlorothiazide  Home Medications   Prior to Admission medications   Medication Sig Start Date End Date Taking? Authorizing Provider  azithromycin (ZITHROMAX Z-PAK) 250 MG tablet 2 po day one, then 1 daily x 4 days 01/10/15   Elmyra Ricks Johniya Durfee, PA-C  b complex vitamins tablet Take 1 tablet by mouth daily.    Historical Provider, MD  benzonatate (TESSALON) 100 MG capsule Take 1 capsule (100 mg total) by mouth every 8 (eight) hours. 01/10/15   Pari Lombard, PA-C  cetirizine (ZYRTEC) 10 MG tablet Take 1 tablet (10 mg total) by mouth daily. 07/15/13   Rowe Clack, MD  Cholecalciferol (VITAMIN D) 2000 UNITS CAPS Take by mouth daily.  Historical Provider, MD  FLUoxetine (PROZAC) 20 MG tablet Take 1 tablet (20 mg total) by mouth daily. 10/21/13   Rowe Clack, MD  Fluticasone Furoate-Vilanterol (BREO ELLIPTA) 100-25 MCG/INH AEPB Inhale 1 puff into the lungs daily. 03/11/14   Deneise Lever, MD  Fluticasone Furoate-Vilanterol (BREO ELLIPTA) 100-25 MCG/INH AEPB 1 puff and rinse mouth, once daily- maintenance 04/22/14   Deneise Lever, MD  HYDROcodone-acetaminophen (NORCO/VICODIN) 5-325 MG per tablet Take 1-2 tablets by mouth every 4 (four) hours as needed. 05/28/14   Charlann Lange, PA-C  ibuprofen (ADVIL,MOTRIN) 200 MG tablet Take 200 mg by mouth every 6 (six) hours as needed.    Historical Provider, MD  ipratropium (ATROVENT) 0.06 % nasal spray 1-2 puffs each nostril up to 4 times daily, if needed  03/11/14   Deneise Lever, MD  losartan (COZAAR) 100 MG tablet TAKE ONE TABLET BY MOUTH ONCE DAILY 10/01/13   Rowe Clack, MD  losartan (COZAAR) 100 MG tablet Take 1 tablet (100 mg total) by mouth daily. 10/22/14   Rowe Clack, MD  meloxicam (MOBIC) 15 MG tablet Take 1 tablet (15 mg total) by mouth daily. 11/12/14   Rowe Clack, MD  montelukast (SINGULAIR) 10 MG tablet Take 1 tablet (10 mg total) by mouth at bedtime. 04/22/14   Deneise Lever, MD  Multiple Vitamin (MULTIVITAMIN) tablet Take 1 tablet by mouth daily.      Historical Provider, MD  Omega 3-6-9 Fatty Acids (OMEGA 3-6-9 COMPLEX PO) Take 1 capsule by mouth daily.      Historical Provider, MD  omeprazole (PRILOSEC) 20 MG capsule Take 1 capsule (20 mg total) by mouth daily. 10/21/13   Rowe Clack, MD  predniSONE (DELTASONE) 20 MG tablet Take 2 tablets (40 mg total) by mouth daily. 01/10/15   Ellis Mehaffey, PA-C  PROAIR HFA 108 (90 BASE) MCG/ACT inhaler INHALE TWO PUFFS BY MOUTH EVERY 6 HOURS AS NEEDED FOR WHEEZING OR SHORTNESS OF BREATH 06/11/14   Rowe Clack, MD   BP 185/82 mmHg  Pulse 88  Temp(Src) 100.5 F (38.1 C) (Oral)  Resp 20  Ht 5' (1.524 m)  Wt 225 lb (102.059 kg)  BMI 43.94 kg/m2  SpO2 98% Physical Exam  Constitutional: She is oriented to person, place, and time. She appears well-developed and well-nourished. No distress.  HENT:  Head: Normocephalic and atraumatic.  Slight dryness to mucous membranes  No drooling or stridor. Posterior pharynx mildly erythematous no significant tonsillar hypertrophy. No exudate. Soft palate rises symmetrically. No TTP or induration under tongue.   No tenderness to palpation of frontal or bilateral maxillary sinuses.  No mucosal edema in the nares.  Bilateral tympanic membranes with normal architecture and good light reflex.    Eyes: Conjunctivae and EOM are normal. Pupils are equal, round, and reactive to light.  Neck: Normal range of motion.  FROM  to C-spine. Pt can touch chin to chest without discomfort. No TTP of midline cervical spine.   Cardiovascular: Normal rate, regular rhythm and intact distal pulses.   Pulmonary/Chest: Effort normal and breath sounds normal. No stridor. No respiratory distress. She has no wheezes. She has no rales. She exhibits no tenderness.  Abdominal: Soft. Bowel sounds are normal. She exhibits no distension and no mass. There is no tenderness. There is no rebound and no guarding.  Musculoskeletal: Normal range of motion. She exhibits no edema or tenderness.  Neurological: She is alert and oriented to person, place, and time.  Follows commands,  Clear, goal oriented speech, Strength is 5 out of 5x4 extremities, patient ambulates with a coordinated in nonantalgic gait. Sensation is grossly intact.   Psychiatric: She has a normal mood and affect.  Nursing note and vitals reviewed.   ED Course  Procedures (including critical care time) Labs Review Labs Reviewed  CBC WITH DIFFERENTIAL/PLATELET - Abnormal; Notable for the following:    Lymphocytes Relative 9 (*)    Monocytes Relative 19 (*)    Monocytes Absolute 1.5 (*)    All other components within normal limits  COMPREHENSIVE METABOLIC PANEL - Abnormal; Notable for the following:    Glucose, Bld 103 (*)    GFR calc non Af Amer 65 (*)    GFR calc Af Amer 75 (*)    All other components within normal limits  URINALYSIS, ROUTINE W REFLEX MICROSCOPIC - Abnormal; Notable for the following:    pH 8.5 (*)    Protein, ur 30 (*)    All other components within normal limits  CULTURE, BLOOD (ROUTINE X 2)  CULTURE, BLOOD (ROUTINE X 2)  URINE CULTURE  APTT  TROPONIN I  PROTIME-INR  URINE MICROSCOPIC-ADD ON  BRAIN NATRIURETIC PEPTIDE  I-STAT CG4 LACTIC ACID, ED    Imaging Review Dg Chest 2 View  01/10/2015   CLINICAL DATA:  Cough and congestion for 3 days, fever  EXAM: CHEST  2 VIEW  COMPARISON:  12/14/2009  FINDINGS: Cardiac shadow is mildly enlarged.  The lungs are well aerated bilaterally. No focal infiltrate or sizable effusion is seen.  IMPRESSION: Mild cardiomegaly.  No acute abnormality is seen.   Electronically Signed   By: Inez Catalina M.D.   On: 01/10/2015 12:22     EKG Interpretation   Date/Time:  Monday January 10 2015 13:11:30 EDT Ventricular Rate:  78 PR Interval:  150 QRS Duration: 90 QT Interval:  408 QTC Calculation: 465 R Axis:   85 Text Interpretation:  Sinus rhythm with Premature supraventricular  complexes Nonspecific T wave abnormality Abnormal ECG No significant  change since last tracing Confirmed by Mingo Amber  MD, [place] ([id number]) on  01/10/2015 1:15:53 PM      MDM   Final diagnoses:  Cough  Acute bronchitis, unspecified organism   Filed Vitals:   01/10/15 1020 01/10/15 1201 01/10/15 1203 01/10/15 1218  BP: 161/73  185/82   Pulse: 82 77 88   Temp: 103.1 F (39.5 C)   100.5 F (38.1 C)  TempSrc: Oral   Oral  Resp: 20     Height: 5' (1.524 m)     Weight: 225 lb (102.059 kg)     SpO2: 96% 93% 98%     Medications  sodium chloride 0.9 % bolus 1,000 mL (0 mLs Intravenous Stopped 01/10/15 1522)  losartan (COZAAR) tablet 100 mg (100 mg Oral Not Given 01/10/15 1336)  acetaminophen (TYLENOL) tablet 650 mg (650 mg Oral Given 01/10/15 1037)  cefTRIAXone (ROCEPHIN) 1 g in dextrose 5 % 50 mL IVPB (0 g Intravenous Stopped 01/10/15 1205)  azithromycin (ZITHROMAX) 500 mg in dextrose 5 % 250 mL IVPB (0 mg Intravenous Stopped 01/10/15 1402)  cefTRIAXone (ROCEPHIN) 1 G injection (  Duplicate 8/41/66 0630)    AKEEMA BRODER is a pleasant 69 y.o. female presenting with productive cough, fever, generalized fatigue worsening over the last several days. Patient also reports a headache which she states is typical for her allergic sinusitis. Patient is febrile to 103.1 here, saturating well on room air 96%, lung sounds are clear to auscultation.  Blood pressure remains strong and patient is not tachycardic. Sepsis protocol initiated  and patient will be bolused approximately 3 L. Blood cultures urine cultures are drawn. Normal lactic acid and no leukocytosis. Chest x-ray with cardiomegaly, will add on a proBNP.  EKG with no significant abnormality, Pro BNP is negative. We'll treat for a acute bronchitis with Z-Pak and steroids. The patient and her daughter who lives with her on aggressive hydration and fever control. They will follow closely with her primary care physician.  This is a shared visit with the attending physician who personally evaluated the patient and agrees with the care plan.   Evaluation does not show pathology that would require ongoing emergent intervention or inpatient treatment. Pt is hemodynamically stable and mentating appropriately. Discussed findings and plan with patient/guardian, who agrees with care plan. All questions answered. Return precautions discussed and outpatient follow up given.   Discharge Medication List as of 01/10/2015  2:16 PM    START taking these medications   Details  azithromycin (ZITHROMAX Z-PAK) 250 MG tablet 2 po day one, then 1 daily x 4 days, Print    benzonatate (TESSALON) 100 MG capsule Take 1 capsule (100 mg total) by mouth every 8 (eight) hours., Starting 01/10/2015, Until Discontinued, Print    predniSONE (DELTASONE) 20 MG tablet Take 2 tablets (40 mg total) by mouth daily., Starting 01/10/2015, Until Discontinued, Print            Monico Blitz, PA-C 01/10/15 1652  Evelina Bucy, MD 01/11/15 [phone]

## 2015-01-10 NOTE — Discharge Instructions (Signed)
Take Tylenol 650 mg (this is normally 2 over the counter pills every 6 hours to control fever. If the fever rises above 100.4 you can take 400 mg of ibuprofen (this is normally 2 over-the-counter pills). You can take Tylenol and then 3 hours later Motrin and 3 hours later Tylenol, etc. etc.  Take your antibiotics as directed and to completion. You should never have any leftover antibiotics! Push fluids and stay well hydrated.   Please follow with your primary care doctor in the next 2 days for a check-up. They must obtain records for further management.   Do not hesitate to return to the Emergency Department for any new, worsening or concerning symptoms.     Acute Bronchitis Bronchitis is inflammation of the airways that extend from the windpipe into the lungs (bronchi). The inflammation often causes mucus to develop. This leads to a cough, which is the most common symptom of bronchitis.  In acute bronchitis, the condition usually develops suddenly and goes away over time, usually in a couple weeks. Smoking, allergies, and asthma can make bronchitis worse. Repeated episodes of bronchitis may cause further lung problems.  CAUSES Acute bronchitis is most often caused by the same virus that causes a cold. The virus can spread from person to person (contagious) through coughing, sneezing, and touching contaminated objects. SIGNS AND SYMPTOMS   Cough.   Fever.   Coughing up mucus.   Body aches.   Chest congestion.   Chills.   Shortness of breath.   Sore throat.  DIAGNOSIS  Acute bronchitis is usually diagnosed through a physical exam. Your health care provider will also ask you questions about your medical history. Tests, such as chest X-rays, are sometimes done to rule out other conditions.  TREATMENT  Acute bronchitis usually goes away in a couple weeks. Oftentimes, no medical treatment is necessary. Medicines are sometimes given for relief of fever or cough. Antibiotic  medicines are usually not needed but may be prescribed in certain situations. In some cases, an inhaler may be recommended to help reduce shortness of breath and control the cough. A cool mist vaporizer may also be used to help thin bronchial secretions and make it easier to clear the chest.  HOME CARE INSTRUCTIONS  Get plenty of rest.   Drink enough fluids to keep your urine clear or pale yellow (unless you have a medical condition that requires fluid restriction). Increasing fluids may help thin your respiratory secretions (sputum) and reduce chest congestion, and it will prevent dehydration.   Take medicines only as directed by your health care provider.  If you were prescribed an antibiotic medicine, finish it all even if you start to feel better.  Avoid smoking and secondhand smoke. Exposure to cigarette smoke or irritating chemicals will make bronchitis worse. If you are a smoker, consider using nicotine gum or skin patches to help control withdrawal symptoms. Quitting smoking will help your lungs heal faster.   Reduce the chances of another bout of acute bronchitis by washing your hands frequently, avoiding people with cold symptoms, and trying not to touch your hands to your mouth, nose, or eyes.   Keep all follow-up visits as directed by your health care provider.  SEEK MEDICAL CARE IF: Your symptoms do not improve after 1 week of treatment.  SEEK IMMEDIATE MEDICAL CARE IF:  You develop an increased fever or chills.   You have chest pain.   You have severe shortness of breath.  You have bloody sputum.   You  develop dehydration.  You faint or repeatedly feel like you are going to pass out.  You develop repeated vomiting.  You develop a severe headache. MAKE SURE YOU:   Understand these instructions.  Will watch your condition.  Will get help right away if you are not doing well or get worse. Document Released: 10/18/2004 Document Revised: 01/25/2014 Document  Reviewed: 03/03/2013 [facility] Patient Information 2015 [place], [place]. This information is not intended to replace advice given to you by your health care provider. Make sure you discuss any questions you have with your health care provider.

## 2015-01-11 LAB — URINE CULTURE
CULTURE: NO GROWTH
Colony Count: NO GROWTH

## 2015-01-16 LAB — CULTURE, BLOOD (ROUTINE X 2)
CULTURE: NO GROWTH
CULTURE: NO GROWTH

## 2015-05-17 ENCOUNTER — Other Ambulatory Visit: Payer: Self-pay | Admitting: Internal Medicine

## 2015-05-25 ENCOUNTER — Telehealth: Payer: Self-pay

## 2015-05-25 NOTE — Telephone Encounter (Signed)
LMOVM

## 2015-05-26 ENCOUNTER — Encounter: Payer: Self-pay | Admitting: Family Medicine

## 2015-05-26 ENCOUNTER — Ambulatory Visit (INDEPENDENT_AMBULATORY_CARE_PROVIDER_SITE_OTHER): Payer: Commercial Managed Care - HMO | Admitting: Family Medicine

## 2015-05-26 VITALS — BP 160/84 | HR 84 | Temp 98.7°F | Ht 60.0 in | Wt 226.1 lb

## 2015-05-26 DIAGNOSIS — G4733 Obstructive sleep apnea (adult) (pediatric): Secondary | ICD-10-CM | POA: Diagnosis not present

## 2015-05-26 DIAGNOSIS — E785 Hyperlipidemia, unspecified: Secondary | ICD-10-CM

## 2015-05-26 DIAGNOSIS — I10 Essential (primary) hypertension: Secondary | ICD-10-CM | POA: Diagnosis not present

## 2015-05-26 DIAGNOSIS — J302 Other seasonal allergic rhinitis: Secondary | ICD-10-CM

## 2015-05-26 DIAGNOSIS — J309 Allergic rhinitis, unspecified: Secondary | ICD-10-CM

## 2015-05-26 DIAGNOSIS — I5041 Acute combined systolic (congestive) and diastolic (congestive) heart failure: Secondary | ICD-10-CM

## 2015-05-26 DIAGNOSIS — K219 Gastro-esophageal reflux disease without esophagitis: Secondary | ICD-10-CM

## 2015-05-26 DIAGNOSIS — L500 Allergic urticaria: Secondary | ICD-10-CM | POA: Diagnosis not present

## 2015-05-26 DIAGNOSIS — I5030 Unspecified diastolic (congestive) heart failure: Secondary | ICD-10-CM | POA: Insufficient documentation

## 2015-05-26 DIAGNOSIS — I509 Heart failure, unspecified: Secondary | ICD-10-CM | POA: Insufficient documentation

## 2015-05-26 DIAGNOSIS — K589 Irritable bowel syndrome without diarrhea: Secondary | ICD-10-CM

## 2015-05-26 DIAGNOSIS — Z8619 Personal history of other infectious and parasitic diseases: Secondary | ICD-10-CM | POA: Insufficient documentation

## 2015-05-26 DIAGNOSIS — N8110 Cystocele, unspecified: Secondary | ICD-10-CM

## 2015-05-26 DIAGNOSIS — J3089 Other allergic rhinitis: Secondary | ICD-10-CM

## 2015-05-26 HISTORY — DX: Acute combined systolic (congestive) and diastolic (congestive) heart failure: I50.41

## 2015-05-26 HISTORY — DX: Irritable bowel syndrome, unspecified: K58.9

## 2015-05-26 LAB — CBC
HCT: 42.2 % (ref 36.0–46.0)
Hemoglobin: 13.7 g/dL (ref 12.0–15.0)
MCHC: 32.5 g/dL (ref 30.0–36.0)
MCV: 90.8 fl (ref 78.0–100.0)
PLATELETS: 265 10*3/uL (ref 150.0–400.0)
RBC: 4.65 Mil/uL (ref 3.87–5.11)
RDW: 13.3 % (ref 11.5–15.5)
WBC: 9.2 10*3/uL (ref 4.0–10.5)

## 2015-05-26 LAB — COMPREHENSIVE METABOLIC PANEL
ALK PHOS: 87 U/L (ref 39–117)
ALT: 15 U/L (ref 0–35)
AST: 17 U/L (ref 0–37)
Albumin: 4.1 g/dL (ref 3.5–5.2)
BILIRUBIN TOTAL: 0.5 mg/dL (ref 0.2–1.2)
BUN: 16 mg/dL (ref 6–23)
CALCIUM: 9.4 mg/dL (ref 8.4–10.5)
CO2: 31 meq/L (ref 19–32)
CREATININE: 0.77 mg/dL (ref 0.40–1.20)
Chloride: 104 mEq/L (ref 96–112)
GFR: 95.96 mL/min (ref >60.00)
GLUCOSE: 93 mg/dL (ref 70–99)
Potassium: 4.3 mEq/L (ref 3.5–5.1)
Sodium: 141 mEq/L (ref 135–145)
TOTAL PROTEIN: 7.7 g/dL (ref 6.0–8.3)

## 2015-05-26 LAB — TSH: TSH: 1.51 u[IU]/mL (ref 0.35–4.50)

## 2015-05-26 LAB — LIPID PANEL
Cholesterol: 187 mg/dL (ref 0–200)
HDL: 44.4 mg/dL (ref >39.00)
LDL Cholesterol: 133 mg/dL — ABNORMAL HIGH (ref 0–99)
NONHDL: 142.57
TRIGLYCERIDES: 49 mg/dL (ref 0.0–149.0)
Total CHOL/HDL Ratio: 4
VLDL: 9.8 mg/dL (ref 0.0–40.0)

## 2015-05-26 MED ORDER — OMEPRAZOLE 20 MG PO CPDR: 20.0000 mg | DELAYED_RELEASE_CAPSULE | Freq: Every day | ORAL | Status: DC

## 2015-05-26 MED ORDER — LOSARTAN POTASSIUM 100 MG PO TABS: 100.0000 mg | ORAL_TABLET | Freq: Every day | ORAL | Status: DC

## 2015-05-26 NOTE — Progress Notes (Signed)
Pre visit review using our clinic review tool, if applicable. No additional management support is needed unless otherwise documented below in the visit note. 

## 2015-05-26 NOTE — Assessment & Plan Note (Signed)
S/p hysterectomy with episodes of feeling her bladder prolapse with hesitancy and urgency is referred to gyn for consideration

## 2015-05-26 NOTE — Assessment & Plan Note (Signed)
Poorly controlled will alter medications, encouraged DASH diet, minimize caffeine and obtain adequate sleep. Report concerning symptoms and follow up as directed and as needed. Has been out of her BP meds for the past week. Had one episode of stabbing left sided chest pain, lasted roughly 45 minutes with some radiation to left arm, had upper abdominal pain (she questions possible reflux) was also nausea. No SOB, palp. Cleared spontaneously. Did not take Zantac as she usually would have when she has pain because she was out.

## 2015-05-26 NOTE — Assessment & Plan Note (Signed)
Uses CPAP nightly, follows with DR Annamaria Boots

## 2015-05-26 NOTE — Patient Instructions (Signed)
Consider Jobst stockings 10-20 mmhg, light weight, knee hi on in am Minimize sodium ,elevate feet. Edema Edema is an abnormal buildup of fluids in your bodytissues. Edema is somewhatdependent on gravity to pull the fluid to the lowest place in your body. That makes the condition more common in the legs and thighs (lower extremities). Painless swelling of the feet and ankles is common and becomes more likely as you get older. It is also common in looser tissues, like around your eyes.  When the affected area is squeezed, the fluid may move out of that spot and leave a dent for a few moments. This dent is called pitting.  CAUSES  There are many possible causes of edema. Eating too much salt and being on your feet or sitting for a long time can cause edema in your legs and ankles. Hot weather may make edema worse. Common medical causes of edema include:  Heart failure.  Liver disease.  Kidney disease.  Weak blood vessels in your legs.  Cancer.  An injury.  Pregnancy.  Some medications.  Obesity. SYMPTOMS  Edema is usually painless.Your skin may look swollen or shiny.  DIAGNOSIS  Your health care provider may be able to diagnose edema by asking about your medical history and doing a physical exam. You may need to have tests such as X-rays, an electrocardiogram, or blood tests to check for medical conditions that may cause edema.  TREATMENT  Edema treatment depends on the cause. If you have heart, liver, or kidney disease, you need the treatment appropriate for these conditions. General treatment may include:  Elevation of the affected body part above the level of your heart.  Compression of the affected body part. Pressure from elastic bandages or support stockings squeezes the tissues and forces fluid back into the blood vessels. This keeps fluid from entering the tissues.  Restriction of fluid and salt intake.  Use of a water pill (diuretic). These medications are appropriate  only for some types of edema. They pull fluid out of your body and make you urinate more often. This gets rid of fluid and reduces swelling, but diuretics can have side effects. Only use diuretics as directed by your health care provider. HOME CARE INSTRUCTIONS   Keep the affected body part above the level of your heart when you are lying down.   Do not sit still or stand for prolonged periods.   Do not put anything directly under your knees when lying down.  Do not wear constricting clothing or garters on your upper legs.   Exercise your legs to work the fluid back into your blood vessels. This may help the swelling go down.   Wear elastic bandages or support stockings to reduce ankle swelling as directed by your health care provider.   Eat a low-salt diet to reduce fluid if your health care provider recommends it.   Only take medicines as directed by your health care provider. SEEK MEDICAL CARE IF:   Your edema is not responding to treatment.  You have heart, liver, or kidney disease and notice symptoms of edema.  You have edema in your legs that does not improve after elevating them.   You have sudden and unexplained weight gain. SEEK IMMEDIATE MEDICAL CARE IF:   You develop shortness of breath or chest pain.   You cannot breathe when you lie down.  You develop pain, redness, or warmth in the swollen areas.   You have heart, liver, or kidney disease and suddenly  get edema.  You have a fever and your symptoms suddenly get worse. MAKE SURE YOU:   Understand these instructions.  Will watch your condition.  Will get help right away if you are not doing well or get worse. Document Released: 09/10/2005 Document Revised: 01/25/2014 Document Reviewed: 07/03/2013 [facility] Patient Information 2015 [place], [place]. This information is not intended to replace advice given to you by your health care provider. Make sure you discuss any questions you have with your health  care provider.

## 2015-05-26 NOTE — Assessment & Plan Note (Signed)
Patient reports allergies to shrimp, egg whites, tomatoes, dairy. These can trigger coughing and breathing difficulties. Taking singulair and Zyrtec regularly is helpful.

## 2015-05-26 NOTE — Progress Notes (Signed)
Subjective:    Patient ID: Anne Barnes, female    DOB: 06/26/1946, 69 y.o.   MRN: [id number]  Chief Complaint  Patient presents with  . Establish Care    HPI Patient is in today for new patient appointment. Generally in good health but doesn't knowledge lots of trouble with allergies. Has historically followed with allergist. Notes that she gets good response with Singulair and Zyrtec most days. No recent illness. Past medical history significant for rheumatic fever as a child and a persistent mild heart murmur. Also notes hypertension, Chiari malformation and reflux. Denies CP/palp/SOB/HA/congestion/fevers/GI or GU c/o. Taking meds as prescribed  Past Medical History  Diagnosis Date  . ALLERGIC RHINITIS   . DIVERTICULOSIS, COLON   . GERD   . HYPERTENSION   . OBSTRUCTIVE SLEEP APNEA 12/2008 dx    noncompliant with CPAP qhs  . Sciatica     right side  . Chiari malformation     Noted MRI brain 09/2013 - s/p neuro eval for same    Past Surgical History  Procedure Laterality Date  . Abdominal hysterectomy  1986  . Breast surgery  1973    Breast biopsy  . Umbilical hernia repair      Family History  Problem Relation Age of Onset  . Arthritis Mother   . Arthritis Father   . Alcohol abuse Other     parent  . Ataxia Neg Hx   . Chorea Neg Hx   . Dementia Neg Hx   . Mental retardation Neg Hx   . Migraines Neg Hx   . Multiple sclerosis Neg Hx   . Neurofibromatosis Neg Hx   . Neuropathy Neg Hx   . Parkinsonism Neg Hx   . Seizures Neg Hx   . Stroke Neg Hx     Social History   Social History  . Marital Status: Married    Spouse Name: N/A  . Number of Children: N/A  . Years of Education: N/A   Occupational History  . retired    Social History Main Topics  . Smoking status: Never Smoker   . Smokeless tobacco: Not on file     Comment: Married, lives with spouse, dtr 2 g-kids.  . Alcohol Use: No     Comment: rare  . Drug Use: No  . Sexual Activity: Not on file    Other Topics Concern  . Not on file   Social History Narrative    Outpatient Prescriptions Prior to Visit  Medication Sig Dispense Refill  . b complex vitamins tablet Take 1 tablet by mouth daily.    . cetirizine (ZYRTEC) 10 MG tablet Take 1 tablet (10 mg total) by mouth daily. 30 tablet 11  . Cholecalciferol (VITAMIN D) 2000 UNITS CAPS Take by mouth daily.     Marland Kitchen ipratropium (ATROVENT) 0.06 % nasal spray SPRAY ONE TO TWO SPRAY(S) IN EACH NOSTRIL UP TO 4 TIMES DAILY AS NEEDED 15 mL 0  . meloxicam (MOBIC) 15 MG tablet Take 1 tablet (15 mg total) by mouth daily. 30 tablet 3  . montelukast (SINGULAIR) 10 MG tablet TAKE ONE TABLET BY MOUTH AT BEDTIME 30 tablet 0  . Multiple Vitamin (MULTIVITAMIN) tablet Take 1 tablet by mouth daily.      . Omega 3-6-9 Fatty Acids (OMEGA 3-6-9 COMPLEX PO) Take 1 capsule by mouth daily.      Marland Kitchen omeprazole (PRILOSEC) 20 MG capsule Take 1 capsule (20 mg total) by mouth daily. 30 capsule 3  . PROAIR  HFA 108 (90 BASE) MCG/ACT inhaler INHALE TWO PUFFS BY MOUTH EVERY 6 HOURS AS NEEDED FOR WHEEZING OR SHORTNESS OF BREATH 9 each 0  . ibuprofen (ADVIL,MOTRIN) 200 MG tablet Take 200 mg by mouth every 6 (six) hours as needed.    Marland Kitchen losartan (COZAAR) 100 MG tablet Take 1 tablet (100 mg total) by mouth daily. (Patient not taking: Reported on 05/26/2015) 90 tablet 0  . azithromycin (ZITHROMAX Z-PAK) 250 MG tablet 2 po day one, then 1 daily x 4 days 5 tablet 0  . benzonatate (TESSALON) 100 MG capsule Take 1 capsule (100 mg total) by mouth every 8 (eight) hours. 21 capsule 0  . FLUoxetine (PROZAC) 20 MG tablet Take 1 tablet (20 mg total) by mouth daily. 30 tablet 3  . Fluticasone Furoate-Vilanterol (BREO ELLIPTA) 100-25 MCG/INH AEPB Inhale 1 puff into the lungs daily. 1 each 0  . Fluticasone Furoate-Vilanterol (BREO ELLIPTA) 100-25 MCG/INH AEPB 1 puff and rinse mouth, once daily- maintenance 1 each prn  . HYDROcodone-acetaminophen (NORCO/VICODIN) 5-325 MG per tablet Take 1-2  tablets by mouth every 4 (four) hours as needed. 12 tablet 0  . losartan (COZAAR) 100 MG tablet TAKE ONE TABLET BY MOUTH ONCE DAILY 90 tablet 1  . predniSONE (DELTASONE) 20 MG tablet Take 2 tablets (40 mg total) by mouth daily. 10 tablet 0   No facility-administered medications prior to visit.    Allergies  Allergen Reactions  . Codeine Nausea And Vomiting  . Lisinopril-Hydrochlorothiazide Hives    REACTION: lip swelling    Review of Systems  Constitutional: Negative for fever, chills and malaise/fatigue.  HENT: Positive for congestion. Negative for hearing loss.   Eyes: Negative for discharge.  Respiratory: Negative for cough, sputum production and shortness of breath.   Cardiovascular: Negative for chest pain, palpitations and leg swelling.  Gastrointestinal: Negative for heartburn, nausea, vomiting, abdominal pain, diarrhea, constipation and blood in stool.  Genitourinary: Negative for dysuria, urgency, frequency and hematuria.  Musculoskeletal: Negative for myalgias, back pain and falls.  Skin: Negative for rash.  Neurological: Negative for dizziness, sensory change, loss of consciousness, weakness and headaches.  Endo/Heme/Allergies: Negative for environmental allergies. Does not bruise/bleed easily.  Psychiatric/Behavioral: Negative for depression and suicidal ideas. The patient is not nervous/anxious and does not have insomnia.        Objective:    Physical Exam  Constitutional: She is oriented to person, place, and time. She appears well-developed and well-nourished. No distress.  HENT:  Head: Normocephalic and atraumatic.  Eyes: Conjunctivae are normal.  Neck: Neck supple. No thyromegaly present.  Cardiovascular: Normal rate and regular rhythm.   Murmur heard. Pulmonary/Chest: Effort normal and breath sounds normal. No respiratory distress.  Abdominal: Soft. Bowel sounds are normal. She exhibits no distension and no mass. There is no tenderness.  Musculoskeletal:  She exhibits no edema.  Lymphadenopathy:    She has no cervical adenopathy.  Neurological: She is alert and oriented to person, place, and time.  Skin: Skin is warm and dry.  Psychiatric: She has a normal mood and affect. Her behavior is normal.    BP 160/84 mmHg  Pulse 84  Temp(Src) 98.7 F (37.1 C) (Oral)  Ht 5' (1.524 m)  Wt 226 lb 2 oz (102.57 kg)  BMI 44.16 kg/m2  SpO2 96% Wt Readings from Last 3 Encounters:  05/26/15 226 lb 2 oz (102.57 kg)  01/10/15 225 lb (102.059 kg)  05/28/14 226 lb (102.513 kg)     Lab Results  Component Value Date  WBC 8.0 01/10/2015   HGB 13.8 01/10/2015   HCT 42.8 01/10/2015   PLT 207 01/10/2015   GLUCOSE 103* 01/10/2015   CHOL 177 02/26/2013   TRIG 37.0 02/26/2013   HDL 41.20 02/26/2013   LDLCALC 128* 02/26/2013   ALT 17 01/10/2015   AST 21 01/10/2015   NA 139 01/10/2015   K 3.9 01/10/2015   CL 102 01/10/2015   CREATININE 0.90 01/10/2015   BUN 17 01/10/2015   CO2 30 01/10/2015   TSH 1.18 02/26/2013   INR 1.06 01/10/2015   HGBA1C  12/15/2009    5.5 (NOTE) The ADA recommends the following therapeutic goal for glycemic control related to Hgb A1c measurement: Goal of therapy: <6.5 Hgb A1c  Reference: American Diabetes Association: Clinical Practice Recommendations 2010, Diabetes Care, 2010, 33: (Suppl  1).    Lab Results  Component Value Date   TSH 1.18 02/26/2013   Lab Results  Component Value Date   WBC 8.0 01/10/2015   HGB 13.8 01/10/2015   HCT 42.8 01/10/2015   MCV 91.8 01/10/2015   PLT 207 01/10/2015   Lab Results  Component Value Date   NA 139 01/10/2015   K 3.9 01/10/2015   CO2 30 01/10/2015   GLUCOSE 103* 01/10/2015   BUN 17 01/10/2015   CREATININE 0.90 01/10/2015   BILITOT 0.7 01/10/2015   ALKPHOS 79 01/10/2015   AST 21 01/10/2015   ALT 17 01/10/2015   PROT 7.9 01/10/2015   ALBUMIN 4.2 01/10/2015   CALCIUM 8.8 01/10/2015   ANIONGAP 7 01/10/2015   GFR 104.40 02/26/2013   Lab Results  Component Value  Date   CHOL 177 02/26/2013   Lab Results  Component Value Date   HDL 41.20 02/26/2013   Lab Results  Component Value Date   LDLCALC 128* 02/26/2013   Lab Results  Component Value Date   TRIG 37.0 02/26/2013   Lab Results  Component Value Date   CHOLHDL 4 02/26/2013   Lab Results  Component Value Date   HGBA1C  12/15/2009    5.5 (NOTE) The ADA recommends the following therapeutic goal for glycemic control related to Hgb A1c measurement: Goal of therapy: <6.5 Hgb A1c  Reference: American Diabetes Association: Clinical Practice Recommendations 2010, Diabetes Care, 2010, 33: (Suppl  1).       Assessment & Plan:  Obstructive sleep apnea Uses CPAP nightly, follows with DR Annamaria Boots  Essential hypertension Poorly controlled will alter medications, encouraged DASH diet, minimize caffeine and obtain adequate sleep. Report concerning symptoms and follow up as directed and as needed. Has been out of her BP meds for the past week. Had one episode of stabbing left sided chest pain, lasted roughly 45 minutes with some radiation to left arm, had upper abdominal pain (she questions possible reflux) was also nausea. No SOB, palp. Cleared spontaneously. Did not take Zantac as she usually would have when she has pain because she was out.  Seasonal and perennial allergic rhinitis Patient reports allergies to shrimp, egg whites, tomatoes, dairy. These can trigger coughing and breathing difficulties. Taking singulair and Zyrtec regularly is helpful.  GERD Avoid offending foods, start probiotics. Do not eat large meals in late evening and consider raising head of bed. Uses Zantac and Omeprazole only as needed  IBS (irritable bowel syndrome) Avoid offending foods, start probiotics. Do not eat large meals in late evening and consider raising head of bed.   Female bladder prolapse S/p hysterectomy with episodes of feeling her bladder prolapse with hesitancy and  urgency is referred to gyn for  consideration    I have discontinued Ms. Lorah's FLUoxetine, Fluticasone Furoate-Vilanterol, Fluticasone Furoate-Vilanterol, HYDROcodone-acetaminophen, azithromycin, predniSONE, and benzonatate. I am also having her maintain her multivitamin, Vitamin D, Omega 3-6-9 Fatty Acids (OMEGA 3-6-9 COMPLEX PO), cetirizine, b complex vitamins, ibuprofen, omeprazole, PROAIR HFA, losartan, meloxicam, montelukast, and ipratropium.  No orders of the defined types were placed in this encounter.     Penni Homans, MD

## 2015-05-26 NOTE — Assessment & Plan Note (Addendum)
Avoid offending foods, start probiotics. Do not eat large meals in late evening and consider raising head of bed. Uses Zantac and Omeprazole only as needed

## 2015-05-26 NOTE — Assessment & Plan Note (Deleted)
Patient reports allergies to shrimp, egg whites, tomatoes, dairy. These can trigger coughing and breathing difficulties. Taking singulair and Zyrtec regularly is helpful.

## 2015-05-26 NOTE — Assessment & Plan Note (Signed)
Avoid offending foods, start probiotics. Do not eat large meals in late evening and consider raising head of bed.  

## 2015-06-17 ENCOUNTER — Other Ambulatory Visit: Payer: Self-pay | Admitting: *Deleted

## 2015-06-17 ENCOUNTER — Telehealth: Payer: Self-pay | Admitting: Family Medicine

## 2015-06-17 MED ORDER — OMEPRAZOLE 20 MG PO CPDR: 20.0000 mg | DELAYED_RELEASE_CAPSULE | Freq: Every day | ORAL | Status: DC

## 2015-06-17 NOTE — Telephone Encounter (Signed)
Rx for prilosec was refilled on 05/26/15 for 9 pills with 1 refill- patient was to get 90 pills with 1 rf.  Rx resent.

## 2015-06-17 NOTE — Telephone Encounter (Signed)
Spoke to patient and clarified- error in order.  Fixed and Rx resent.

## 2015-06-17 NOTE — Telephone Encounter (Signed)
Pt requesting call about 1 of the new meds she got on 9/1 and about a referral. Call her at [phone].

## 2015-07-12 ENCOUNTER — Telehealth: Payer: Self-pay | Admitting: Internal Medicine

## 2015-07-12 ENCOUNTER — Other Ambulatory Visit: Payer: Self-pay | Admitting: Family Medicine

## 2015-07-12 DIAGNOSIS — J302 Other seasonal allergic rhinitis: Secondary | ICD-10-CM

## 2015-07-12 NOTE — Telephone Encounter (Signed)
lmtcb x1 for pt to offer appt below

## 2015-07-12 NOTE — Telephone Encounter (Signed)
Anne Barnes, can pt be worked in sooner? thanks

## 2015-07-12 NOTE — Telephone Encounter (Signed)
Pt is already established with CY for allergies; pt last seen 03-2014 and told to follow up 4 months. Pt can be seen on Tuesday 07-26-2015 at 11:15am slot. Thanks.

## 2015-07-13 NOTE — Telephone Encounter (Signed)
LMTCB

## 2015-07-14 NOTE — Telephone Encounter (Signed)
appt scheduled already by [facility].  Nothing further needed.

## 2015-07-26 ENCOUNTER — Encounter: Payer: Self-pay | Admitting: Internal Medicine

## 2015-07-26 ENCOUNTER — Ambulatory Visit (INDEPENDENT_AMBULATORY_CARE_PROVIDER_SITE_OTHER): Payer: Commercial Managed Care - HMO | Admitting: Internal Medicine

## 2015-07-26 VITALS — BP 130/78 | HR 72 | Ht 60.0 in | Wt 227.0 lb

## 2015-07-26 DIAGNOSIS — J309 Allergic rhinitis, unspecified: Secondary | ICD-10-CM

## 2015-07-26 DIAGNOSIS — J302 Other seasonal allergic rhinitis: Secondary | ICD-10-CM

## 2015-07-26 DIAGNOSIS — L500 Allergic urticaria: Secondary | ICD-10-CM | POA: Diagnosis not present

## 2015-07-26 DIAGNOSIS — J3089 Other allergic rhinitis: Principal | ICD-10-CM

## 2015-07-26 MED ORDER — IPRATROPIUM BROMIDE 0.06 % NA SOLN: NASAL | Status: DC

## 2015-07-26 MED ORDER — METHYLPREDNISOLONE ACETATE 80 MG/ML IJ SUSP
80.0000 mg | Freq: Once | INTRAMUSCULAR | Status: AC
  Administered 2015-07-26: 80 mg via INTRAMUSCULAR

## 2015-07-26 MED ORDER — PHENYLEPHRINE HCL 1 % NA SOLN
3.0000 [drp] | Freq: Once | NASAL | Status: AC
  Administered 2015-07-26: 3 [drp] via NASAL

## 2015-07-26 MED ORDER — ALBUTEROL SULFATE HFA 108 (90 BASE) MCG/ACT IN AERS: INHALATION_SPRAY | RESPIRATORY_TRACT | Status: DC

## 2015-07-26 NOTE — Patient Instructions (Addendum)
Ok to avoid gluten products if you find gluten causes bothersome mucus.  Script printedt to refill ipratropium nasal spray  Script printed for albuterol rescue inhaler to use if needed  Neb neo nasal        Dx seasonal and perennial allergic rhinitis  Depo 80

## 2015-07-26 NOTE — Assessment & Plan Note (Signed)
She associates a pressure sensation in her head with cough but does not notice sneezing or nasal congestion. Plan-refill ipratropium nasal spray, nasal nebulizer decongestant treatment, Depo-Medrol

## 2015-07-26 NOTE — Assessment & Plan Note (Signed)
No recent outbreaks. Dietary restrictions discussed.

## 2015-07-26 NOTE — Progress Notes (Signed)
03/11/14- 67 yoF never smoker  COMPLAINS OF:  Referred by Dr. Uvaldo Bristle of pressure in her sinuses.  sinus drainage.  Seen by Dr Gwenette Greet for OSA Left frontal/temporal pressure discomfort, episodic, tearing, diagnosed in the past as cluster headache. History of recurrent sinus infections. Now complains of repeated cough and chest congestion blamed on post nasal drip with mucus especially over the past 6 months, some wheeze, but says "chest stays clear". Postnasal drip with mucus, much drainage. Right ear tinnitus, occasional left ear ache. Has tried various nasal sprays, daily Zyrtec, Sudafed PE. No effect of position or environmental changes. History of seasonal allergic rhinitis, sensitive to cats, got rid of her dog. CT of sinuses was negative. MR brain- Ciari malformation. 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 Not sensitive to aspirin.  04/22/14- 67 yoF never smoker  Followed for rhinitis/ allergy, asthma, urticaria Brio Ellipta is helping chest Cough increases pressure in her face but is mostly dry Hives are generally idiopathic; possibly related to foods, sun, heat, cold or viruses. Use OTC benadryl, call if symptoms persist or worsen. Can try other antihistamines or possibly H2 blockers later prn. Come and go on chest. Peanut and cat cause coughing and we advised her to avoid them Allergy profile 03/11/14- positive especially for dust, cat and dog Food allergy profile- total IgE 86 with several food group elevations Sed rate-WNL  07/26/15- 67 yoF never smoker  Followed for rhinitis/ allergy, asthma FOLLOWS FOR: per Dr Charlett Blake; Pt states she is having increased allergy concerns-trouble with gluten. Patient's main complaint today is pressure sensation retro-orbital and frontal, increased with coughing. Little sneeze or nasal discharge. Does not feel stopped up in the nose. Likes ipratropium nasal spray. She emphasizes that she has been reading labels and associates gluten with  increased mucus in her throat. We discussed this. She is restricting her diet to reduce gluten. No recent hives or wheezing  ROS-see HPI Constitutional:   No-   weight loss, night sweats, fevers, chills, fatigue, lassitude. HEENT:   +headaches, difficulty swallowing, tooth/dental problems, sore throat,       No-  sneezing, itching, +ear ache, +nasal congestion, +post nasal drip,  CV:  No-   chest pain, orthopnea, PND, swelling in lower extremities, anasarca,  dizziness, palpitations Resp: +shortness of breath with exertion or at rest.              +productive cough,  No non-productive cough,  No- coughing up of blood.              No-   change in color of mucus.  No- wheezing.   Skin: No-   rash or lesions. GI:  No-   heartburn, indigestion, abdominal pain, nausea, vomiting,  GU: . MS:  No-   joint pain or swelling.   Neuro-     nothing unusual Psych:  No- change in mood or affect. No depression or anxiety.  No memory loss.  OBJ- Physical Exam General- Alert, Oriented, Affect-appropriate, Distress- none acute, + obese Skin- rash-none, lesions- none, excoriation- none Lymphadenopathy- none Head- atraumatic            Eyes- Gross vision intact, PERRLA, conjunctivae and secretions clear            Ears- Hearing, canals-normal            Nose- + turbinate edema, no-Septal dev, mucus, polyps, erosion, perforation             Throat- Mallampati III-IV , mucosa clear , drainage-  none, tonsils- atrophic, +Dentures Neck- flexible , trachea midline, no stridor , thyroid nl, carotid no bruit Chest - symmetrical excursion , unlabored           Heart/CV- RRR , no murmur , no gallop  , no rub, nl s1 s2                           - JVD- none , edema- none, stasis changes- none, varices- none           Lung- clear to P&A, wheeze- none, cough- none , dullness-none, rub- none           Chest wall-  Abd- Br/ Gen/ Rectal- Not done, not indicated Extrem- cyanosis- none, clubbing, none, atrophy- none,  strength- nl Neuro- grossly intact to observation

## 2015-08-29 ENCOUNTER — Encounter: Payer: Self-pay | Admitting: Family Medicine

## 2015-08-29 ENCOUNTER — Ambulatory Visit (INDEPENDENT_AMBULATORY_CARE_PROVIDER_SITE_OTHER): Payer: Commercial Managed Care - HMO | Admitting: Family Medicine

## 2015-08-29 VITALS — BP 128/78 | HR 81 | Temp 98.3°F | Ht 60.0 in | Wt 225.1 lb

## 2015-08-29 DIAGNOSIS — Z780 Asymptomatic menopausal state: Secondary | ICD-10-CM

## 2015-08-29 DIAGNOSIS — I10 Essential (primary) hypertension: Secondary | ICD-10-CM | POA: Diagnosis not present

## 2015-08-29 DIAGNOSIS — E785 Hyperlipidemia, unspecified: Secondary | ICD-10-CM | POA: Diagnosis not present

## 2015-08-29 DIAGNOSIS — Z1239 Encounter for other screening for malignant neoplasm of breast: Secondary | ICD-10-CM

## 2015-08-29 DIAGNOSIS — Z1159 Encounter for screening for other viral diseases: Secondary | ICD-10-CM

## 2015-08-29 DIAGNOSIS — K219 Gastro-esophageal reflux disease without esophagitis: Secondary | ICD-10-CM | POA: Diagnosis not present

## 2015-08-29 DIAGNOSIS — Z23 Encounter for immunization: Secondary | ICD-10-CM

## 2015-08-29 MED ORDER — LOSARTAN POTASSIUM 100 MG PO TABS: 100.0000 mg | ORAL_TABLET | Freq: Every day | ORAL | Status: DC

## 2015-08-29 NOTE — Assessment & Plan Note (Signed)
Encouraged heart healthy diet, increase exercise, avoid trans fats, consider a krill oil cap daily 

## 2015-08-29 NOTE — Progress Notes (Signed)
Pre visit review using our clinic review tool, if applicable. No additional management support is needed unless otherwise documented below in the visit note. 

## 2015-08-29 NOTE — Assessment & Plan Note (Signed)
Avoid offending foods, start probiotics. Do not eat large meals in late evening and consider raising head of bed.  

## 2015-08-29 NOTE — Patient Instructions (Addendum)
Elderberry liquid, gummies or caps can help with colds and sore throat   Hypertension Hypertension, commonly called high blood pressure, is when the force of blood pumping through your arteries is too strong. Your arteries are the blood vessels that carry blood from your heart throughout your body. A blood pressure reading consists of a higher number over a lower number, such as 110/72. The higher number (systolic) is the pressure inside your arteries when your heart pumps. The lower number (diastolic) is the pressure inside your arteries when your heart relaxes. Ideally you want your blood pressure below 120/80. Hypertension forces your heart to work harder to pump blood. Your arteries may become narrow or stiff. Having untreated or uncontrolled hypertension can cause heart attack, stroke, kidney disease, and other problems. RISK FACTORS Some risk factors for high blood pressure are controllable. Others are not.  Risk factors you cannot control include:   Race. You may be at higher risk if you are African American.  Age. Risk increases with age.  Gender. Men are at higher risk than women before age 46 years. After age 72, women are at higher risk than men. Risk factors you can control include:  Not getting enough exercise or physical activity.  Being overweight.  Getting too much fat, sugar, calories, or salt in your diet.  Drinking too much alcohol. SIGNS AND SYMPTOMS Hypertension does not usually cause signs or symptoms. Extremely high blood pressure (hypertensive crisis) may cause headache, anxiety, shortness of breath, and nosebleed. DIAGNOSIS To check if you have hypertension, your health care provider will measure your blood pressure while you are seated, with your arm held at the level of your heart. It should be measured at least twice using the same arm. Certain conditions can cause a difference in blood pressure between your right and left arms. A blood pressure reading that is  higher than normal on one occasion does not mean that you need treatment. If it is not clear whether you have high blood pressure, you may be asked to return on a different day to have your blood pressure checked again. Or, you may be asked to monitor your blood pressure at home for 1 or more weeks. TREATMENT Treating high blood pressure includes making lifestyle changes and possibly taking medicine. Living a healthy lifestyle can help lower high blood pressure. You may need to change some of your habits. Lifestyle changes may include:  Following the DASH diet. This diet is high in fruits, vegetables, and whole grains. It is low in salt, red meat, and added sugars.  Keep your sodium intake below 2,300 mg per day.  Getting at least 30-45 minutes of aerobic exercise at least 4 times per week.  Losing weight if necessary.  Not smoking.  Limiting alcoholic beverages.  Learning ways to reduce stress. Your health care provider may prescribe medicine if lifestyle changes are not enough to get your blood pressure under control, and if one of the following is true:  You are 37-44 years of age and your systolic blood pressure is above 140.  You are 104 years of age or older, and your systolic blood pressure is above 150.  Your diastolic blood pressure is above 90.  You have diabetes, and your systolic blood pressure is over [id number] or your diastolic blood pressure is over 90.  You have kidney disease and your blood pressure is above 140/90.  You have heart disease and your blood pressure is above 140/90. Your personal target blood pressure may  vary depending on your medical conditions, your age, and other factors. HOME CARE INSTRUCTIONS  Have your blood pressure rechecked as directed by your health care provider.   Take medicines only as directed by your health care provider. Follow the directions carefully. Blood pressure medicines must be taken as prescribed. The medicine does not work as  well when you skip doses. Skipping doses also puts you at risk for problems.  Do not smoke.   Monitor your blood pressure at home as directed by your health care provider. SEEK MEDICAL CARE IF:   You think you are having a reaction to medicines taken.  You have recurrent headaches or feel dizzy.  You have swelling in your ankles.  You have trouble with your vision. SEEK IMMEDIATE MEDICAL CARE IF:  You develop a severe headache or confusion.  You have unusual weakness, numbness, or feel faint.  You have severe chest or abdominal pain.  You vomit repeatedly.  You have trouble breathing. MAKE SURE YOU:   Understand these instructions.  Will watch your condition.  Will get help right away if you are not doing well or get worse.   This information is not intended to replace advice given to you by your health care provider. Make sure you discuss any questions you have with your health care provider.   Document Released: 09/10/2005 Document Revised: 01/25/2015 Document Reviewed: 07/03/2013 Elsevier Interactive Patient Education [employer].

## 2015-08-29 NOTE — Assessment & Plan Note (Signed)
Well controlled, no changes to meds. Encouraged heart healthy diet such as the DASH diet and exercise as tolerated.  °

## 2015-09-03 NOTE — Progress Notes (Signed)
Subjective:    Patient ID: Anne Barnes, female    DOB: Feb 08, 1946, 69 y.o.   MRN: [id number]  Chief Complaint  Patient presents with  . Follow-up    HPI Patient is in today for follow-up. Has been struggling with a respiratory infection but it is improving. She's had head and chest congestion as well as cough no fevers or chills. She has had family members with similar sy Denies CP/palp/SOB/HA/congestion/fevers/GI or GU c/o. Taking meds as prescribedmptoms. No other recent illness or acute concerns.  Past Medical History  Diagnosis Date  . ALLERGIC RHINITIS   . DIVERTICULOSIS, COLON   . GERD   . HYPERTENSION   . OBSTRUCTIVE SLEEP APNEA 12/2008 dx    noncompliant with CPAP qhs  . Sciatica     right side  . Chiari malformation     Noted MRI brain 09/2013 - s/p neuro eval for same  . Allergic urticaria 08/01/2014    Patient reports allergies to shrimp, egg whites, tomatoes, dairy   . 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 History of chicken pox   . H/O measles   . H/O mumps   . IBS (irritable bowel syndrome) 05/26/2015    Past Surgical History  Procedure Laterality Date  . Abdominal hysterectomy  1986  . Breast surgery  1973    Breast biopsy  . Umbilical hernia repair      Family History  Problem Relation Age of Onset  . Arthritis Mother     died of complications from hip surgery  . Arthritis Father   . Kidney disease Father   . Pneumonia Father   . Heart disease Father   . Alcohol abuse Other     parent  . Ataxia Neg Hx   . Chorea Neg Hx   . Dementia Neg Hx   . Mental retardation Neg Hx   . Migraines Neg Hx   . Multiple sclerosis Neg Hx   . Neurofibromatosis Neg Hx   . Neuropathy Neg Hx   . Parkinsonism Neg Hx   . Seizures Neg Hx   . Stroke Neg Hx   . Lupus Daughter   . Rheum arthritis Daughter   . Sjogren's syndrome Daughter   . Fibromyalgia Daughter   . Diabetes Brother   . Heart disease Brother   . Cancer Brother     throat and lung  . Alcohol abuse  Brother   . Kidney disease Sister     dialysis  . Obesity Sister   . Hypertension Maternal Grandfather   . Hypertension Sister   . Pneumonia Brother   . Hypertension Daughter   . Graves' disease Daughter   . Hypertension Daughter     Social History   Social History  . Marital Status: Married    Spouse Name: N/A  . Number of Children: N/A  . Years of Education: N/A   Occupational History  . retired    Social History Main Topics  . Smoking status: Never Smoker   . Smokeless tobacco: Not on file     Comment: Married, lives with spouse, dtr 2 g-kids.  . Alcohol Use: No     Comment: rare  . Drug Use: No  . Sexual Activity: Not on file     Comment: lives with husband, no dietary restrictions avoid foods allergic to, retired   Other Topics Concern  . Not on file   Social History Narrative    Outpatient Prescriptions Prior to Visit  Medication Sig  Dispense Refill  . albuterol (VENTOLIN HFA) 108 (90 BASE) MCG/ACT inhaler 2 puffs every 4 hours if needed 1 Inhaler 11  . b complex vitamins tablet Take 1 tablet by mouth daily.    . cetirizine (ZYRTEC) 10 MG tablet Take 1 tablet (10 mg total) by mouth daily. 30 tablet 11  . Cholecalciferol (VITAMIN D) 2000 UNITS CAPS Take by mouth daily.     Marland Kitchen ibuprofen (ADVIL,MOTRIN) 200 MG tablet Take 200 mg by mouth every 6 (six) hours as needed.    Marland Kitchen ipratropium (ATROVENT) 0.06 % nasal spray 1-2 puffs each nostril twice daily as needed 15 mL 11  . meloxicam (MOBIC) 15 MG tablet Take 1 tablet (15 mg total) by mouth daily. (Patient taking differently: Take 15 mg by mouth daily as needed. ) 30 tablet 3  . montelukast (SINGULAIR) 10 MG tablet TAKE ONE TABLET BY MOUTH AT BEDTIME 30 tablet 0  . Multiple Vitamin (MULTIVITAMIN) tablet Take 1 tablet by mouth daily.      . Omega 3-6-9 Fatty Acids (OMEGA 3-6-9 COMPLEX PO) Take 1 capsule by mouth daily.      Marland Kitchen omeprazole (PRILOSEC) 20 MG capsule Take 1 capsule (20 mg total) by mouth daily. (Patient  taking differently: Take 20 mg by mouth daily as needed. ) 90 capsule 1  . losartan (COZAAR) 100 MG tablet Take 1 tablet (100 mg total) by mouth daily. 90 tablet 1   No facility-administered medications prior to visit.    Allergies  Allergen Reactions  . Codeine Nausea And Vomiting  . Lisinopril-Hydrochlorothiazide Hives    REACTION: lip swelling,facial swelling,rash.    Review of Systems  Constitutional: Negative for fever and malaise/fatigue.  HENT: Positive for congestion.   Eyes: Negative for discharge.  Respiratory: Positive for cough. Negative for shortness of breath.   Cardiovascular: Negative for chest pain, palpitations and leg swelling.  Gastrointestinal: Negative for nausea and abdominal pain.  Genitourinary: Negative for dysuria.  Musculoskeletal: Negative for falls.  Skin: Negative for rash.  Neurological: Negative for loss of consciousness and headaches.  Endo/Heme/Allergies: Negative for environmental allergies.  Psychiatric/Behavioral: Negative for depression. The patient is not nervous/anxious.        Objective:    Physical Exam  Constitutional: She is oriented to person, place, and time. She appears well-developed and well-nourished. No distress.  HENT:  Head: Normocephalic and atraumatic.  Nose: Nose normal.  Eyes: Right eye exhibits no discharge. Left eye exhibits no discharge.  Neck: Normal range of motion. Neck supple.  Cardiovascular: Normal rate and regular rhythm.   No murmur heard. Pulmonary/Chest: Effort normal and breath sounds normal.  Abdominal: Soft. Bowel sounds are normal. There is no tenderness.  Musculoskeletal: She exhibits no edema.  Neurological: She is alert and oriented to person, place, and time.  Skin: Skin is warm and dry.  Psychiatric: She has a normal mood and affect.  Nursing note and vitals reviewed.   BP 128/78 mmHg  Pulse 81  Temp(Src) 98.3 F (36.8 C) (Oral)  Ht 5' (1.524 m)  Wt 225 lb 2 oz (102.116 kg)  BMI  43.97 kg/m2  SpO2 95% Wt Readings from Last 3 Encounters:  08/29/15 225 lb 2 oz (102.116 kg)  07/26/15 227 lb (102.967 kg)  05/26/15 226 lb 2 oz (102.57 kg)     Lab Results  Component Value Date   WBC 9.2 05/26/2015   HGB 13.7 05/26/2015   HCT 42.2 05/26/2015   PLT 265.0 05/26/2015   GLUCOSE 93 05/26/2015  CHOL 187 05/26/2015   TRIG 49.0 05/26/2015   HDL 44.40 05/26/2015   LDLCALC 133* 05/26/2015   ALT 15 05/26/2015   AST 17 05/26/2015   NA 141 05/26/2015   K 4.3 05/26/2015   CL 104 05/26/2015   CREATININE 0.77 05/26/2015   BUN 16 05/26/2015   CO2 31 05/26/2015   TSH 1.51 05/26/2015   INR 1.06 01/10/2015   HGBA1C  12/15/2009    5.5 (NOTE) The ADA recommends the following therapeutic goal for glycemic control related to Hgb A1c measurement: Goal of therapy: <6.5 Hgb A1c  Reference: American Diabetes Association: Clinical Practice Recommendations 2010, Diabetes Care, 2010, 33: (Suppl  1).    Lab Results  Component Value Date   TSH 1.51 05/26/2015   Lab Results  Component Value Date   WBC 9.2 05/26/2015   HGB 13.7 05/26/2015   HCT 42.2 05/26/2015   MCV 90.8 05/26/2015   PLT 265.0 05/26/2015   Lab Results  Component Value Date   NA 141 05/26/2015   K 4.3 05/26/2015   CO2 31 05/26/2015   GLUCOSE 93 05/26/2015   BUN 16 05/26/2015   CREATININE 0.77 05/26/2015   BILITOT 0.5 05/26/2015   ALKPHOS 87 05/26/2015   AST 17 05/26/2015   ALT 15 05/26/2015   PROT 7.7 05/26/2015   ALBUMIN 4.1 05/26/2015   CALCIUM 9.4 05/26/2015   ANIONGAP 7 01/10/2015   GFR 95.96 05/26/2015   Lab Results  Component Value Date   CHOL 187 05/26/2015   Lab Results  Component Value Date   HDL 44.40 05/26/2015   Lab Results  Component Value Date   LDLCALC 133* 05/26/2015   Lab Results  Component Value Date   TRIG 49.0 05/26/2015   Lab Results  Component Value Date   CHOLHDL 4 05/26/2015   Lab Results  Component Value Date   HGBA1C  12/15/2009    5.5 (NOTE) The ADA  recommends the following therapeutic goal for glycemic control related to Hgb A1c measurement: Goal of therapy: <6.5 Hgb A1c  Reference: American Diabetes Association: Clinical Practice Recommendations 2010, Diabetes Care, 2010, 33: (Suppl  1).       Assessment & Plan:   Problem List Items Addressed This Visit    Essential hypertension    Well controlled, no changes to meds. Encouraged heart healthy diet such as the DASH diet and exercise as tolerated.       Relevant Medications   losartan (COZAAR) 100 MG tablet   Other Relevant Orders   TSH   CBC   Lipid panel   Comprehensive metabolic panel   GERD    Avoid offending foods, start probiotics. Do not eat large meals in late evening and consider raising head of bed.       Hyperlipidemia, mild    Encouraged heart healthy diet, increase exercise, avoid trans fats, consider a krill oil cap daily      Relevant Medications   losartan (COZAAR) 100 MG tablet   Other Relevant Orders   Lipid panel    Other Visit Diagnoses    Encounter for immunization    -  Primary    Breast cancer screening        Relevant Orders    MM Digital Screening    Postmenopausal estrogen deficiency        Relevant Orders    DG Bone Density    Screening for viral disease        Relevant Orders    Hepatitis C  Antibody    Need for diphtheria-tetanus-pertussis (Tdap) vaccine, adult/adolescent        Relevant Orders    Tdap vaccine greater than or equal to 7yo IM (Completed)       I am having Anne Barnes maintain her multivitamin, Vitamin D, Omega 3-6-9 Fatty Acids (OMEGA 3-6-9 COMPLEX PO), cetirizine, b complex vitamins, ibuprofen, meloxicam, montelukast, omeprazole, ipratropium, albuterol, and losartan.  Meds ordered this encounter  Medications  . losartan (COZAAR) 100 MG tablet    Sig: Take 1 tablet (100 mg total) by mouth daily.    Dispense:  90 tablet    Refill:  1     Penni Homans, MD

## 2015-11-22 ENCOUNTER — Ambulatory Visit (HOSPITAL_BASED_OUTPATIENT_CLINIC_OR_DEPARTMENT_OTHER)
Admission: RE | Admit: 2015-11-22 | Discharge: 2015-11-22 | Disposition: A | Discharge status: Home / Self Care | Payer: Commercial Managed Care - HMO | Source: Ambulatory Visit | Attending: Family Medicine | Admitting: Family Medicine

## 2015-11-22 ENCOUNTER — Other Ambulatory Visit (INDEPENDENT_AMBULATORY_CARE_PROVIDER_SITE_OTHER): Payer: Commercial Managed Care - HMO

## 2015-11-22 DIAGNOSIS — M810 Age-related osteoporosis without current pathological fracture: Secondary | ICD-10-CM | POA: Diagnosis not present

## 2015-11-22 DIAGNOSIS — Z1231 Encounter for screening mammogram for malignant neoplasm of breast: Secondary | ICD-10-CM | POA: Insufficient documentation

## 2015-11-22 DIAGNOSIS — Z1159 Encounter for screening for other viral diseases: Secondary | ICD-10-CM

## 2015-11-22 DIAGNOSIS — Z1239 Encounter for other screening for malignant neoplasm of breast: Secondary | ICD-10-CM | POA: Diagnosis not present

## 2015-11-22 DIAGNOSIS — Z780 Asymptomatic menopausal state: Secondary | ICD-10-CM

## 2015-11-22 DIAGNOSIS — E2839 Other primary ovarian failure: Secondary | ICD-10-CM | POA: Diagnosis not present

## 2015-11-22 DIAGNOSIS — I10 Essential (primary) hypertension: Secondary | ICD-10-CM | POA: Diagnosis not present

## 2015-11-22 DIAGNOSIS — E785 Hyperlipidemia, unspecified: Secondary | ICD-10-CM

## 2015-11-22 LAB — LIPID PANEL
CHOLESTEROL: 171 mg/dL (ref 0–200)
HDL: 46.3 mg/dL (ref >39.00)
LDL CALC: 116 mg/dL — AB (ref 0–99)
NONHDL: 124.27
Total CHOL/HDL Ratio: 4
Triglycerides: 41 mg/dL (ref 0.0–149.0)
VLDL: 8.2 mg/dL (ref 0.0–40.0)

## 2015-11-22 LAB — CBC
HEMATOCRIT: 40.6 % (ref 36.0–46.0)
Hemoglobin: 13.3 g/dL (ref 12.0–15.0)
MCHC: 32.7 g/dL (ref 30.0–36.0)
MCV: 90.7 fl (ref 78.0–100.0)
Platelets: 245 10*3/uL (ref 150.0–400.0)
RBC: 4.47 Mil/uL (ref 3.87–5.11)
RDW: 13.7 % (ref 11.5–15.5)
WBC: 8.4 10*3/uL (ref 4.0–10.5)

## 2015-11-22 LAB — TSH: TSH: 1.48 u[IU]/mL (ref 0.35–4.50)

## 2015-11-22 LAB — COMPREHENSIVE METABOLIC PANEL
ALBUMIN: 4 g/dL (ref 3.5–5.2)
ALT: 13 U/L (ref 0–35)
AST: 17 U/L (ref 0–37)
Alkaline Phosphatase: 82 U/L (ref 39–117)
BUN: 15 mg/dL (ref 6–23)
CHLORIDE: 105 meq/L (ref 96–112)
CO2: 30 mEq/L (ref 19–32)
Calcium: 9.2 mg/dL (ref 8.4–10.5)
Creatinine, Ser: 0.75 mg/dL (ref 0.40–1.20)
GFR: 98.77 mL/min (ref >60.00)
Glucose, Bld: 102 mg/dL — ABNORMAL HIGH (ref 70–99)
POTASSIUM: 3.7 meq/L (ref 3.5–5.1)
SODIUM: 140 meq/L (ref 135–145)
Total Bilirubin: 0.5 mg/dL (ref 0.2–1.2)
Total Protein: 7.4 g/dL (ref 6.0–8.3)

## 2015-11-23 ENCOUNTER — Ambulatory Visit (INDEPENDENT_AMBULATORY_CARE_PROVIDER_SITE_OTHER): Payer: Commercial Managed Care - HMO | Admitting: Internal Medicine

## 2015-11-23 ENCOUNTER — Encounter: Payer: Self-pay | Admitting: Internal Medicine

## 2015-11-23 VITALS — BP 130/86 | HR 74 | Ht 60.0 in | Wt 229.4 lb

## 2015-11-23 DIAGNOSIS — J309 Allergic rhinitis, unspecified: Secondary | ICD-10-CM

## 2015-11-23 DIAGNOSIS — G4733 Obstructive sleep apnea (adult) (pediatric): Secondary | ICD-10-CM | POA: Diagnosis not present

## 2015-11-23 DIAGNOSIS — Z91018 Allergy to other foods: Secondary | ICD-10-CM

## 2015-11-23 DIAGNOSIS — J4531 Mild persistent asthma with (acute) exacerbation: Secondary | ICD-10-CM | POA: Diagnosis not present

## 2015-11-23 DIAGNOSIS — J4530 Mild persistent asthma, uncomplicated: Secondary | ICD-10-CM | POA: Diagnosis not present

## 2015-11-23 DIAGNOSIS — J3089 Other allergic rhinitis: Secondary | ICD-10-CM

## 2015-11-23 DIAGNOSIS — J302 Other seasonal allergic rhinitis: Secondary | ICD-10-CM

## 2015-11-23 HISTORY — DX: Mild persistent asthma, uncomplicated: J45.30

## 2015-11-23 LAB — HEPATITIS C ANTIBODY: HCV Ab: NEGATIVE

## 2015-11-23 MED ORDER — AZELASTINE-FLUTICASONE 137-50 MCG/ACT NA SUSP: 1.0000 | Freq: Every day | NASAL | Status: DC

## 2015-11-23 MED ORDER — LEVALBUTEROL HCL 0.63 MG/3ML IN NEBU
0.6300 mg | INHALATION_SOLUTION | Freq: Once | RESPIRATORY_TRACT | Status: AC
Start: 2015-11-23 — End: 2015-11-23
  Administered 2015-11-23: 0.63 mg via RESPIRATORY_TRACT

## 2015-11-23 MED ORDER — METHYLPREDNISOLONE ACETATE 80 MG/ML IJ SUSP
80.0000 mg | Freq: Once | INTRAMUSCULAR | Status: AC
  Administered 2015-11-23: 80 mg via INTRAMUSCULAR

## 2015-11-23 MED ORDER — FLUTICASONE FUROATE-VILANTEROL 100-25 MCG/INH IN AEPB: INHALATION_SPRAY | RESPIRATORY_TRACT | Status: DC

## 2015-11-23 NOTE — Progress Notes (Signed)
03/11/14- 70 yoF never smoker  COMPLAINS OF:  Referred by Dr. Uvaldo Bristle of pressure in her sinuses.  sinus drainage.  Seen by Dr Gwenette Greet for OSA Left frontal/temporal pressure discomfort, episodic, tearing, diagnosed in the past as cluster headache. History of recurrent sinus infections. Now complains of repeated cough and chest congestion blamed on post nasal drip with mucus especially over the past 6 months, some wheeze, but says "chest stays clear". Postnasal drip with mucus, much drainage. Right ear tinnitus, occasional left ear ache. Has tried various nasal sprays, daily Zyrtec, Sudafed PE. No effect of position or environmental changes. History of seasonal allergic rhinitis, sensitive to cats, got rid of her dog. CT of sinuses was negative. MR brain- Ciari malformation. 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 Not sensitive to aspirin.  04/22/14- 70 yoF never smoker  Followed for rhinitis/ allergy, asthma, urticaria Brio Ellipta is helping chest Cough increases pressure in her face but is mostly dry Hives are generally idiopathic; possibly related to foods, sun, heat, cold or viruses. Use OTC benadryl, call if symptoms persist or worsen. Can try other antihistamines or possibly H2 blockers later prn. Come and go on chest. Peanut and cat cause coughing and we advised her to avoid them Allergy profile 03/11/14- positive especially for dust, cat and dog Food allergy profile- total IgE 86 with several food group elevations Sed rate-WNL  07/26/15- 70 yoF never smoker  Followed for rhinitis/ allergy, asthma FOLLOWS FOR: per Dr Charlett Blake; Pt states she is having increased allergy concerns-trouble with gluten. Patient's main complaint today is pressure sensation retro-orbital and frontal, increased with coughing. Little sneeze or nasal discharge. Does not feel stopped up in the nose. Likes ipratropium nasal spray. She emphasizes that she has been reading labels and associates gluten with  increased mucus in her throat. We discussed this. She is restricting her diet to reduce gluten. No recent hives or wheezing  11/23/2015-70 year old female never smoker followed for rhinitis/allergies, asthma, OSA FOLLOWS FOR: Pt would like to discuss diet and certain things she feels affects her cough. Pt c/o cough and head pressure when coughing.  For the last 4 days she has been waking in the morning with frontal pressure headache-blames pollen. Otherwise little change in chronic watery nasal discharge with postnasal drip and associated mild cough. Admits some wheezing especially when she goes to the gym. Ipratropium nasal spray does help. Hasn't liked to use her albuterol rescue inhaler but indicates just doesn't want to feel she needs it. Asks prescription for replacement CPAP mask. Diagnosed OSA 10 years ago in [facility] by her family practice doctor who was no longer there. Supplies through [facility]. Tries to use CPAP most nights and clearly has felt it helped her.  ROS-see HPI Constitutional:   No-   weight loss, night sweats, fevers, chills, fatigue, lassitude. HEENT:   +headaches, difficulty swallowing, tooth/dental problems, sore throat,       No-  sneezing, itching, +ear ache, +nasal congestion, +post nasal drip,  CV:  No-   chest pain, orthopnea, PND, swelling in lower extremities, anasarca,  dizziness, palpitations Resp: +shortness of breath with exertion or at rest.              +productive cough,  No non-productive cough,  No- coughing up of blood.              No-   change in color of mucus.  No- wheezing.   Skin: No-   rash or lesions. GI:  No-   heartburn,  indigestion, abdominal pain, nausea, vomiting,  GU: . MS:  No-   joint pain or swelling.   Neuro-     nothing unusual Psych:  No- change in mood or affect. No depression or anxiety.  No memory loss.  OBJ- Physical Exam General- Alert, Oriented, Affect-appropriate, Distress- none acute, + obese Skin- rash-none, lesions-  none, excoriation- none Lymphadenopathy- none Head- atraumatic            Eyes- Gross vision intact, PERRLA, conjunctivae and secretions clear            Ears- Hearing, canals-normal            Nose- + turbinate edema, no-Septal dev, mucus, polyps, erosion, perforation             Throat- Mallampati III-IV , mucosa clear , drainage- none, tonsils- atrophic, +Dentures Neck- flexible , trachea midline, no stridor , thyroid nl, carotid no bruit Chest - symmetrical excursion , unlabored           Heart/CV- RRR , no murmur , no gallop  , no rub, nl s1 s2                           - JVD- none , edema- none, stasis changes- none, varices- none           Lung- clear to P&A, wheeze + mild, cough + wheezy , dullness-none, rub- none           Chest wall-  Abd- Br/ Gen/ Rectal- Not done, not indicated Extrem- cyanosis- none, clubbing, none, atrophy- none, strength- nl Neuro- grossly intact to observation

## 2015-11-23 NOTE — Assessment & Plan Note (Signed)
She has focused on gluten avoidance now. I advised only that she avoid those specific foods she finds cause her actual problems.

## 2015-11-23 NOTE — Assessment & Plan Note (Signed)
Educated about rescue and maintenance inhalers. Plan-try Breo 100 maintenance inhaler. Nebulizer treatments Xopenex, Depo-Medrol today, office spirometry

## 2015-11-23 NOTE — Assessment & Plan Note (Signed)
Seasonal exacerbation now Plan-try Dymista nasal spray

## 2015-11-23 NOTE — Assessment & Plan Note (Signed)
Needs replacement CPAP mask and supplies. She is now Medicare age and is going to need an updated documentation study. Plan-schedule sleep study, replacement CPAP mask and supplies

## 2015-11-23 NOTE — Patient Instructions (Signed)
Order- [facility]- replacement CPAP mask and supplies     Dx OSA  Order- schedule unattended Home Sleep Test     Dx OSA  Sample and script to try Dymista nasal spray   1-2 puffs each nostril once daily at bedtime  Sample and script Breo 100  Maintenance inhaler     Inhale 1 puff, then rise mouth, once daily  You can still use your ipratropium nasal spray and your albuterol rescue inhaler as needed. Try using the albuterol before you go to the gym.  Neb xop 0.63  Depo 80

## 2015-11-24 ENCOUNTER — Telehealth: Payer: Self-pay | Admitting: Internal Medicine

## 2015-11-24 NOTE — Telephone Encounter (Signed)
Spoke with Melissa at [facility].  Verified order was for Cpap mask and supplies. She verbalized understanding.

## 2015-11-28 ENCOUNTER — Ambulatory Visit: Payer: Commercial Managed Care - HMO | Admitting: Family Medicine

## 2015-11-29 DIAGNOSIS — G4733 Obstructive sleep apnea (adult) (pediatric): Secondary | ICD-10-CM | POA: Diagnosis not present

## 2015-11-29 DIAGNOSIS — I4940 Unspecified premature depolarization: Secondary | ICD-10-CM | POA: Diagnosis not present

## 2015-11-30 ENCOUNTER — Ambulatory Visit (INDEPENDENT_AMBULATORY_CARE_PROVIDER_SITE_OTHER): Payer: Commercial Managed Care - HMO | Admitting: Family Medicine

## 2015-11-30 ENCOUNTER — Encounter: Payer: Self-pay | Admitting: Family Medicine

## 2015-11-30 VITALS — BP 142/88 | HR 68 | Temp 98.5°F | Ht 59.5 in | Wt 226.2 lb

## 2015-11-30 DIAGNOSIS — K219 Gastro-esophageal reflux disease without esophagitis: Secondary | ICD-10-CM

## 2015-11-30 DIAGNOSIS — I10 Essential (primary) hypertension: Secondary | ICD-10-CM

## 2015-11-30 DIAGNOSIS — E785 Hyperlipidemia, unspecified: Secondary | ICD-10-CM

## 2015-11-30 DIAGNOSIS — J4531 Mild persistent asthma with (acute) exacerbation: Secondary | ICD-10-CM

## 2015-11-30 DIAGNOSIS — Z23 Encounter for immunization: Secondary | ICD-10-CM | POA: Diagnosis not present

## 2015-11-30 MED ORDER — MELOXICAM 15 MG PO TABS: 15.0000 mg | ORAL_TABLET | Freq: Every day | ORAL | Status: DC

## 2015-11-30 NOTE — Progress Notes (Signed)
Subjective:    Patient ID: Anne Barnes, female    DOB: October 15, 1945, 70 y.o.   MRN: [id number]  Chief Complaint  Patient presents with  . Follow-up    bp and respitory infection    HPI Patient is in today for for her 3 month follow-up. She recently seen at [facility] by Dr. Annamaria Boots and was prescribed 2 new medications (see list).  Reports feeling better since starting these medications.  Patient presented today with elevated BP, states did not get good night"s rest which could be contributing to elevated BP.  Patient has been exercising and appears to be feeling well.   Denies CP/palp/SOB/HA/congestion/fevers/GI or GU c/o. Taking meds as prescribed   Past Medical History  Diagnosis Date  . ALLERGIC RHINITIS   . DIVERTICULOSIS, COLON   . GERD   . HYPERTENSION   . OBSTRUCTIVE SLEEP APNEA 12/2008 dx    noncompliant with CPAP qhs  . Sciatica     right side  . Chiari malformation     Noted MRI brain 09/2013 - s/p neuro eval for same  . Allergic urticaria 08/01/2014    Patient reports allergies to shrimp, egg whites, tomatoes, dairy   . 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 History of chicken pox   . H/O measles   . H/O mumps   . IBS (irritable bowel syndrome) 05/26/2015    Past Surgical History  Procedure Laterality Date  . Abdominal hysterectomy  1986  . Breast surgery  1973    Breast biopsy  . Umbilical hernia repair      Family History  Problem Relation Age of Onset  . Arthritis Mother     died of complications from hip surgery  . Arthritis Father   . Kidney disease Father   . Pneumonia Father   . Heart disease Father   . Alcohol abuse Other     parent  . Ataxia Neg Hx   . Chorea Neg Hx   . Dementia Neg Hx   . Mental retardation Neg Hx   . Migraines Neg Hx   . Multiple sclerosis Neg Hx   . Neurofibromatosis Neg Hx   . Neuropathy Neg Hx   . Parkinsonism Neg Hx   . Seizures Neg Hx   . Stroke Neg Hx   . Lupus Daughter   . Rheum arthritis Daughter   .  Sjogren's syndrome Daughter   . Fibromyalgia Daughter   . Diabetes Brother   . Heart disease Brother   . Cancer Brother     throat and lung  . Alcohol abuse Brother   . Kidney disease Sister     dialysis  . Obesity Sister   . Hypertension Maternal Grandfather   . Hypertension Sister   . Pneumonia Brother   . Hypertension Daughter   . Graves' disease Daughter   . Hypertension Daughter     Social History   Social History  . Marital Status: Married    Spouse Name: N/A  . Number of Children: N/A  . Years of Education: N/A   Occupational History  . retired    Social History Main Topics  . Smoking status: Never Smoker   . Smokeless tobacco: Not on file     Comment: Married, lives with spouse, dtr 2 g-kids.  . Alcohol Use: No     Comment: rare  . Drug Use: No  . Sexual Activity: Not on file     Comment: lives with husband, no dietary restrictions  avoid foods allergic to, retired   Other Topics Concern  . Not on file   Social History Narrative    Outpatient Prescriptions Prior to Visit  Medication Sig Dispense Refill  . albuterol (VENTOLIN HFA) 108 (90 BASE) MCG/ACT inhaler 2 puffs every 4 hours if needed 1 Inhaler 11  . Azelastine-Fluticasone (DYMISTA) 137-50 MCG/ACT SUSP Place 1-2 puffs into the nose at bedtime. 1 Bottle prn  . b complex vitamins tablet Take 1 tablet by mouth daily.    . cetirizine (ZYRTEC) 10 MG tablet Take 1 tablet (10 mg total) by mouth daily. 30 tablet 11  . Cholecalciferol (VITAMIN D) 2000 UNITS CAPS Take by mouth daily.     . fluticasone furoate-vilanterol (BREO ELLIPTA) 100-25 MCG/INH AEPB Inhale 1 puff then rinse mouth once daily 60 each 12  . ibuprofen (ADVIL,MOTRIN) 200 MG tablet Take 200 mg by mouth every 6 (six) hours as needed.    Marland Kitchen ipratropium (ATROVENT) 0.06 % nasal spray 1-2 puffs each nostril twice daily as needed 15 mL 11  . losartan (COZAAR) 100 MG tablet Take 1 tablet (100 mg total) by mouth daily. 90 tablet 1  . montelukast  (SINGULAIR) 10 MG tablet TAKE ONE TABLET BY MOUTH AT BEDTIME 30 tablet 0  . Multiple Vitamin (MULTIVITAMIN) tablet Take 1 tablet by mouth daily.      . Omega 3-6-9 Fatty Acids (OMEGA 3-6-9 COMPLEX PO) Take 1 capsule by mouth daily.      Marland Kitchen omeprazole (PRILOSEC) 20 MG capsule Take 1 capsule (20 mg total) by mouth daily. (Patient taking differently: Take 20 mg by mouth daily as needed. ) 90 capsule 1  . meloxicam (MOBIC) 15 MG tablet Take 1 tablet (15 mg total) by mouth daily. (Patient taking differently: Take 15 mg by mouth daily as needed. ) 30 tablet 3   No facility-administered medications prior to visit.    Allergies  Allergen Reactions  . Codeine Nausea And Vomiting  . Lisinopril-Hydrochlorothiazide Hives    REACTION: lip swelling,facial swelling,rash.    Review of Systems  Constitutional: Positive for malaise/fatigue. Negative for fever and chills.  HENT: Negative for congestion and hearing loss.   Eyes: Negative for discharge.  Respiratory: Negative for cough, sputum production and shortness of breath.   Cardiovascular: Negative for chest pain, palpitations and leg swelling.  Gastrointestinal: Negative for heartburn, nausea, vomiting, abdominal pain, diarrhea, constipation and blood in stool.  Genitourinary: Negative for dysuria, urgency, frequency and hematuria.  Musculoskeletal: Negative for myalgias, back pain and falls.  Skin: Negative for rash.  Neurological: Negative for dizziness, sensory change, loss of consciousness, weakness and headaches.  Endo/Heme/Allergies: Negative for environmental allergies. Does not bruise/bleed easily.  Psychiatric/Behavioral: Negative for depression and suicidal ideas. The patient is not nervous/anxious and does not have insomnia.        Objective:    Physical Exam  Constitutional: She is oriented to person, place, and time. She appears well-developed and well-nourished. No distress.  HENT:  Head: Normocephalic and atraumatic.  Nose:  Nose normal.  Eyes: Right eye exhibits no discharge. Left eye exhibits no discharge.  Neck: Normal range of motion. Neck supple.  Cardiovascular: Normal rate and regular rhythm.   No murmur heard. Pulmonary/Chest: Effort normal and breath sounds normal.  Abdominal: Soft. Bowel sounds are normal. There is no tenderness.  Musculoskeletal: She exhibits no edema.  Neurological: She is alert and oriented to person, place, and time.  Skin: Skin is warm and dry.  Psychiatric: She has a normal mood  and affect.  Nursing note and vitals reviewed.   BP 142/88 mmHg  Pulse 68  Temp(Src) 98.5 F (36.9 C) (Oral)  Ht 4' 11.5" (1.511 m)  Wt 226 lb 3.2 oz (102.604 kg)  BMI 44.94 kg/m2  SpO2 99% Wt Readings from Last 3 Encounters:  11/30/15 226 lb 3.2 oz (102.604 kg)  11/23/15 229 lb 6.4 oz (104.055 kg)  08/29/15 225 lb 2 oz (102.116 kg)     Lab Results  Component Value Date   WBC 8.4 11/22/2015   HGB 13.3 11/22/2015   HCT 40.6 11/22/2015   PLT 245.0 11/22/2015   GLUCOSE 102* 11/22/2015   CHOL 171 11/22/2015   TRIG 41.0 11/22/2015   HDL 46.30 11/22/2015   LDLCALC 116* 11/22/2015   ALT 13 11/22/2015   AST 17 11/22/2015   NA 140 11/22/2015   K 3.7 11/22/2015   CL 105 11/22/2015   CREATININE 0.75 11/22/2015   BUN 15 11/22/2015   CO2 30 11/22/2015   TSH 1.48 11/22/2015   INR 1.06 01/10/2015   HGBA1C  12/15/2009    5.5 (NOTE) The ADA recommends the following therapeutic goal for glycemic control related to Hgb A1c measurement: Goal of therapy: <6.5 Hgb A1c  Reference: American Diabetes Association: Clinical Practice Recommendations 2010, Diabetes Care, 2010, 33: (Suppl  1).    Lab Results  Component Value Date   TSH 1.48 11/22/2015   Lab Results  Component Value Date   WBC 8.4 11/22/2015   HGB 13.3 11/22/2015   HCT 40.6 11/22/2015   MCV 90.7 11/22/2015   PLT 245.0 11/22/2015   Lab Results  Component Value Date   NA 140 11/22/2015   K 3.7 11/22/2015   CO2 30 11/22/2015     GLUCOSE 102* 11/22/2015   BUN 15 11/22/2015   CREATININE 0.75 11/22/2015   BILITOT 0.5 11/22/2015   ALKPHOS 82 11/22/2015   AST 17 11/22/2015   ALT 13 11/22/2015   PROT 7.4 11/22/2015   ALBUMIN 4.0 11/22/2015   CALCIUM 9.2 11/22/2015   ANIONGAP 7 01/10/2015   GFR 98.77 11/22/2015   Lab Results  Component Value Date   CHOL 171 11/22/2015   Lab Results  Component Value Date   HDL 46.30 11/22/2015   Lab Results  Component Value Date   LDLCALC 116* 11/22/2015   Lab Results  Component Value Date   TRIG 41.0 11/22/2015   Lab Results  Component Value Date   CHOLHDL 4 11/22/2015   Lab Results  Component Value Date   HGBA1C  12/15/2009    5.5 (NOTE) The ADA recommends the following therapeutic goal for glycemic control related to Hgb A1c measurement: Goal of therapy: <6.5 Hgb A1c  Reference: American Diabetes Association: Clinical Practice Recommendations 2010, Diabetes Care, 2010, 33: (Suppl  1).       Assessment & Plan:   Problem List Items Addressed This Visit    Asthma, mild persistent    Is improving with recent medication changes initiated by pulmonology.      Essential hypertension    Mildly elevated with poor sleep and stress this am. At home is seeing good BP numbers 120s to 130s over less than 90s. no changes to meds. Encouraged heart healthy diet such as the DASH diet and exercise as tolerated. No change to meds now      GERD    Avoid offending foods, start probiotics. Do not eat large meals in late evening and consider raising head of bed.  Hyperlipidemia, mild    Encouraged heart healthy diet, increase exercise, avoid trans fats, consider a krill oil cap daily       Other Visit Diagnoses    Need for vaccination with 13-polyvalent pneumococcal conjugate vaccine    -  Primary    Relevant Orders    Pneumococcal conjugate vaccine 13-valent IM (Completed)       I am having Ms. Montag maintain her multivitamin, Vitamin D, Omega 3-6-9 Fatty  Acids (OMEGA 3-6-9 COMPLEX PO), cetirizine, b complex vitamins, ibuprofen, montelukast, omeprazole, ipratropium, albuterol, losartan, Azelastine-Fluticasone, fluticasone furoate-vilanterol, and meloxicam.  Meds ordered this encounter  Medications  . meloxicam (MOBIC) 15 MG tablet    Sig: Take 1 tablet (15 mg total) by mouth daily.    Dispense:  30 tablet    Refill:  1    appt needed for more refills     Penni Homans, MD

## 2015-11-30 NOTE — Assessment & Plan Note (Signed)
Encouraged heart healthy diet, increase exercise, avoid trans fats, consider a krill oil cap daily 

## 2015-11-30 NOTE — Patient Instructions (Signed)
Hypertension Hypertension, commonly called high blood pressure, is when the force of blood pumping through your arteries is too strong. Your arteries are the blood vessels that carry blood from your heart throughout your body. A blood pressure reading consists of a higher number over a lower number, such as 110/72. The higher number (systolic) is the pressure inside your arteries when your heart pumps. The lower number (diastolic) is the pressure inside your arteries when your heart relaxes. Ideally you want your blood pressure below 120/80. Hypertension forces your heart to work harder to pump blood. Your arteries may become narrow or stiff. Having untreated or uncontrolled hypertension can cause heart attack, stroke, kidney disease, and other problems. RISK FACTORS Some risk factors for high blood pressure are controllable. Others are not.  Risk factors you cannot control include:   Race. You may be at higher risk if you are African American.  Age. Risk increases with age.  Gender. Men are at higher risk than women before age 45 years. After age 65, women are at higher risk than men. Risk factors you can control include:  Not getting enough exercise or physical activity.  Being overweight.  Getting too much fat, sugar, calories, or salt in your diet.  Drinking too much alcohol. SIGNS AND SYMPTOMS Hypertension does not usually cause signs or symptoms. Extremely high blood pressure (hypertensive crisis) may cause headache, anxiety, shortness of breath, and nosebleed. DIAGNOSIS To check if you have hypertension, your health care provider will measure your blood pressure while you are seated, with your arm held at the level of your heart. It should be measured at least twice using the same arm. Certain conditions can cause a difference in blood pressure between your right and left arms. A blood pressure reading that is higher than normal on one occasion does not mean that you need treatment. If  it is not clear whether you have high blood pressure, you may be asked to return on a different day to have your blood pressure checked again. Or, you may be asked to monitor your blood pressure at home for 1 or more weeks. TREATMENT Treating high blood pressure includes making lifestyle changes and possibly taking medicine. Living a healthy lifestyle can help lower high blood pressure. You may need to change some of your habits. Lifestyle changes may include:  Following the DASH diet. This diet is high in fruits, vegetables, and whole grains. It is low in salt, red meat, and added sugars.  Keep your sodium intake below 2,300 mg per day.  Getting at least 30-45 minutes of aerobic exercise at least 4 times per week.  Losing weight if necessary.  Not smoking.  Limiting alcoholic beverages.  Learning ways to reduce stress. Your health care provider may prescribe medicine if lifestyle changes are not enough to get your blood pressure under control, and if one of the following is true:  You are 18-59 years of age and your systolic blood pressure is above 140.  You are 60 years of age or older, and your systolic blood pressure is above 150.  Your diastolic blood pressure is above 90.  You have diabetes, and your systolic blood pressure is over 140 or your diastolic blood pressure is over 90.  You have kidney disease and your blood pressure is above 140/90.  You have heart disease and your blood pressure is above 140/90. Your personal target blood pressure may vary depending on your medical conditions, your age, and other factors. HOME CARE INSTRUCTIONS    Have your blood pressure rechecked as directed by your health care provider.   Take medicines only as directed by your health care provider. Follow the directions carefully. Blood pressure medicines must be taken as prescribed. The medicine does not work as well when you skip doses. Skipping doses also puts you at risk for  problems.  Do not smoke.   Monitor your blood pressure at home as directed by your health care provider. SEEK MEDICAL CARE IF:   You think you are having a reaction to medicines taken.  You have recurrent headaches or feel dizzy.  You have swelling in your ankles.  You have trouble with your vision. SEEK IMMEDIATE MEDICAL CARE IF:  You develop a severe headache or confusion.  You have unusual weakness, numbness, or feel faint.  You have severe chest or abdominal pain.  You vomit repeatedly.  You have trouble breathing. MAKE SURE YOU:   Understand these instructions.  Will watch your condition.  Will get help right away if you are not doing well or get worse.   This information is not intended to replace advice given to you by your health care provider. Make sure you discuss any questions you have with your health care provider.   Document Released: 09/10/2005 Document Revised: 01/25/2015 Document Reviewed: 07/03/2013 Elsevier Interactive Patient Education 2016 [employer]  

## 2015-11-30 NOTE — Assessment & Plan Note (Signed)
Is improving with recent medication changes initiated by pulmonology.

## 2015-11-30 NOTE — Progress Notes (Signed)
Pre visit review using our clinic review tool, if applicable. No additional management support is needed unless otherwise documented below in the visit note. 

## 2015-11-30 NOTE — Assessment & Plan Note (Addendum)
Mildly elevated with poor sleep and stress this am. At home is seeing good BP numbers 120s to 130s over less than 90s. no changes to meds. Encouraged heart healthy diet such as the DASH diet and exercise as tolerated. No change to meds now

## 2015-11-30 NOTE — Assessment & Plan Note (Signed)
Avoid offending foods, start probiotics. Do not eat large meals in late evening and consider raising head of bed.  

## 2015-12-08 DIAGNOSIS — G4733 Obstructive sleep apnea (adult) (pediatric): Secondary | ICD-10-CM | POA: Diagnosis not present

## 2015-12-21 ENCOUNTER — Telehealth: Payer: Self-pay | Admitting: Family Medicine

## 2015-12-21 NOTE — Telephone Encounter (Signed)
Pt last seen on 11/30/15 for Hypertension follow up.  Please advise. Ok to write?

## 2015-12-21 NOTE — Telephone Encounter (Signed)
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 States she needs this before they will work with her because of her BP

## 2015-12-21 NOTE — Telephone Encounter (Signed)
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

## 2015-12-22 NOTE — Telephone Encounter (Signed)
Note written and placed in Dr. Frederik Pear red folder for review and signature.

## 2015-12-22 NOTE — Telephone Encounter (Signed)
Letter signed and mailed.  Pt notified and made aware.

## 2015-12-29 ENCOUNTER — Ambulatory Visit: Payer: Commercial Managed Care - HMO | Admitting: Internal Medicine

## 2015-12-29 DIAGNOSIS — G4733 Obstructive sleep apnea (adult) (pediatric): Secondary | ICD-10-CM | POA: Diagnosis not present

## 2016-01-03 ENCOUNTER — Other Ambulatory Visit: Payer: Self-pay | Admitting: *Deleted

## 2016-01-03 DIAGNOSIS — G4733 Obstructive sleep apnea (adult) (pediatric): Secondary | ICD-10-CM

## 2016-03-05 ENCOUNTER — Encounter: Payer: Self-pay | Admitting: Family Medicine

## 2016-03-05 ENCOUNTER — Ambulatory Visit (INDEPENDENT_AMBULATORY_CARE_PROVIDER_SITE_OTHER): Payer: Commercial Managed Care - HMO | Admitting: Family Medicine

## 2016-03-05 VITALS — BP 148/84 | HR 64 | Temp 98.5°F | Ht 59.5 in | Wt 228.5 lb

## 2016-03-05 DIAGNOSIS — Z23 Encounter for immunization: Secondary | ICD-10-CM

## 2016-03-05 DIAGNOSIS — E785 Hyperlipidemia, unspecified: Secondary | ICD-10-CM

## 2016-03-05 DIAGNOSIS — R739 Hyperglycemia, unspecified: Secondary | ICD-10-CM | POA: Diagnosis not present

## 2016-03-05 DIAGNOSIS — L500 Allergic urticaria: Secondary | ICD-10-CM

## 2016-03-05 DIAGNOSIS — J4531 Mild persistent asthma with (acute) exacerbation: Secondary | ICD-10-CM

## 2016-03-05 DIAGNOSIS — G4733 Obstructive sleep apnea (adult) (pediatric): Secondary | ICD-10-CM

## 2016-03-05 DIAGNOSIS — I10 Essential (primary) hypertension: Secondary | ICD-10-CM | POA: Diagnosis not present

## 2016-03-05 HISTORY — DX: Hyperglycemia, unspecified: R73.9

## 2016-03-05 LAB — CBC
HEMATOCRIT: 39.8 % (ref 36.0–46.0)
Hemoglobin: 13.2 g/dL (ref 12.0–15.0)
MCHC: 33.1 g/dL (ref 30.0–36.0)
MCV: 89.6 fl (ref 78.0–100.0)
PLATELETS: 241 10*3/uL (ref 150.0–400.0)
RBC: 4.44 Mil/uL (ref 3.87–5.11)
RDW: 13.5 % (ref 11.5–15.5)
WBC: 8.4 10*3/uL (ref 4.0–10.5)

## 2016-03-05 LAB — LIPID PANEL
CHOL/HDL RATIO: 4
CHOLESTEROL: 179 mg/dL (ref 0–200)
HDL: 40.9 mg/dL (ref >39.00)
LDL CALC: 130 mg/dL — AB (ref 0–99)
NonHDL: 138.41
Triglycerides: 44 mg/dL (ref 0.0–149.0)
VLDL: 8.8 mg/dL (ref 0.0–40.0)

## 2016-03-05 LAB — COMPREHENSIVE METABOLIC PANEL
ALBUMIN: 4 g/dL (ref 3.5–5.2)
ALT: 15 U/L (ref 0–35)
AST: 18 U/L (ref 0–37)
Alkaline Phosphatase: 76 U/L (ref 39–117)
BUN: 16 mg/dL (ref 6–23)
CALCIUM: 9.2 mg/dL (ref 8.4–10.5)
CHLORIDE: 105 meq/L (ref 96–112)
CO2: 28 meq/L (ref 19–32)
CREATININE: 0.77 mg/dL (ref 0.40–1.20)
GFR: 95.73 mL/min (ref >60.00)
Glucose, Bld: 102 mg/dL — ABNORMAL HIGH (ref 70–99)
POTASSIUM: 3.8 meq/L (ref 3.5–5.1)
SODIUM: 140 meq/L (ref 135–145)
Total Bilirubin: 0.5 mg/dL (ref 0.2–1.2)
Total Protein: 7.3 g/dL (ref 6.0–8.3)

## 2016-03-05 LAB — TSH: TSH: 2.28 u[IU]/mL (ref 0.35–4.50)

## 2016-03-05 LAB — HEMOGLOBIN A1C: HEMOGLOBIN A1C: 5.4 % (ref 4.6–6.5)

## 2016-03-05 MED ORDER — ZOSTER VACCINE LIVE 19400 UNT/0.65ML ~~LOC~~ SUSR: 0.6500 mL | Freq: Once | SUBCUTANEOUS | Status: DC

## 2016-03-05 NOTE — Patient Instructions (Signed)

## 2016-03-05 NOTE — Assessment & Plan Note (Signed)
Continue Singulair prn and Zyrtec and Flonase prn, encouraged to try using all 3 consistently to see if it helps

## 2016-03-05 NOTE — Assessment & Plan Note (Signed)
minimize simple carbs. Increase exercise as tolerated.  

## 2016-03-05 NOTE — Assessment & Plan Note (Signed)
Uses CPAP nightly, feels it dries her out and is contributing to her mucus build up and causes her feeling tight and winded.

## 2016-03-05 NOTE — Progress Notes (Signed)
Pre visit review using our clinic review tool, if applicable. No additional management support is needed unless otherwise documented below in the visit note. 

## 2016-03-05 NOTE — Assessment & Plan Note (Signed)
Encouraged heart healthy diet, increase exercise, avoid trans fats, consider a krill oil cap daily 
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
## 2016-03-05 NOTE — Progress Notes (Signed)
Patient ID: JAMEICA MIYASATO, female   DOB: Oct 12, 1945, 70 y.o.   MRN: [id number]   Subjective:    Patient ID: Iantha Fallen, female    DOB: 01-Jul-1946, 70 y.o.   MRN: [id number]  Chief Complaint  Patient presents with  . Follow-up    HPI Patient is in today for follow up. Feels well today but acknowledges being under a great deal of stress at home with family. Has been having trouble sleeping soundly. Has had intermittent episodes of SOB when she is stressed. No recent hospitalizations. Denies CP/palp/SOB/HA/congestion/fevers/GI or GU c/o. Taking meds as prescribed  Past Medical History  Diagnosis Date  . ALLERGIC RHINITIS   . DIVERTICULOSIS, COLON   . GERD   . HYPERTENSION   . OBSTRUCTIVE SLEEP APNEA 12/2008 dx    noncompliant with CPAP qhs  . Sciatica     right side  . Chiari malformation     Noted MRI brain 09/2013 - s/p neuro eval for same  . Allergic urticaria 08/01/2014    Patient reports allergies to shrimp, egg whites, tomatoes, dairy   . 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 History of chicken pox   . H/O measles   . H/O mumps   . IBS (irritable bowel syndrome) 05/26/2015    Past Surgical History  Procedure Laterality Date  . Abdominal hysterectomy  1986  . Breast surgery  1973    Breast biopsy  . Umbilical hernia repair      Family History  Problem Relation Age of Onset  . Arthritis Mother     died of complications from hip surgery  . Arthritis Father   . Kidney disease Father   . Pneumonia Father   . Heart disease Father   . Alcohol abuse Other     parent  . Ataxia Neg Hx   . Chorea Neg Hx   . Dementia Neg Hx   . Mental retardation Neg Hx   . Migraines Neg Hx   . Multiple sclerosis Neg Hx   . Neurofibromatosis Neg Hx   . Neuropathy Neg Hx   . Parkinsonism Neg Hx   . Seizures Neg Hx   . Stroke Neg Hx   . Lupus Daughter   . Rheum arthritis Daughter   . Sjogren's syndrome Daughter   . Fibromyalgia Daughter   . Diabetes Brother   . Heart disease  Brother   . Cancer Brother     throat and lung  . Alcohol abuse Brother   . Kidney disease Sister     dialysis  . Obesity Sister   . Hypertension Maternal Grandfather   . Hypertension Sister   . Pneumonia Brother   . Hypertension Daughter   . Graves' disease Daughter   . Hypertension Daughter     Social History   Social History  . Marital Status: Married    Spouse Name: N/A  . Number of Children: N/A  . Years of Education: N/A   Occupational History  . retired    Social History Main Topics  . Smoking status: Never Smoker   . Smokeless tobacco: Not on file     Comment: Married, lives with spouse, dtr 2 g-kids.  . Alcohol Use: No     Comment: rare  . Drug Use: No  . Sexual Activity: Not on file     Comment: lives with husband, no dietary restrictions avoid foods allergic to, retired   Other Topics Concern  . Not on file   Social  History Narrative    Outpatient Prescriptions Prior to Visit  Medication Sig Dispense Refill  . albuterol (VENTOLIN HFA) 108 (90 BASE) MCG/ACT inhaler 2 puffs every 4 hours if needed 1 Inhaler 11  . Azelastine-Fluticasone (DYMISTA) 137-50 MCG/ACT SUSP Place 1-2 puffs into the nose at bedtime. 1 Bottle prn  . b complex vitamins tablet Take 1 tablet by mouth daily.    . cetirizine (ZYRTEC) 10 MG tablet Take 1 tablet (10 mg total) by mouth daily. 30 tablet 11  . Cholecalciferol (VITAMIN D) 2000 UNITS CAPS Take by mouth daily.     . fluticasone furoate-vilanterol (BREO ELLIPTA) 100-25 MCG/INH AEPB Inhale 1 puff then rinse mouth once daily 60 each 12  . ibuprofen (ADVIL,MOTRIN) 200 MG tablet Take 200 mg by mouth every 6 (six) hours as needed.    Marland Kitchen ipratropium (ATROVENT) 0.06 % nasal spray 1-2 puffs each nostril twice daily as needed 15 mL 11  . losartan (COZAAR) 100 MG tablet Take 1 tablet (100 mg total) by mouth daily. 90 tablet 1  . meloxicam (MOBIC) 15 MG tablet Take 1 tablet (15 mg total) by mouth daily. 30 tablet 1  . montelukast  (SINGULAIR) 10 MG tablet TAKE ONE TABLET BY MOUTH AT BEDTIME 30 tablet 0  . Multiple Vitamin (MULTIVITAMIN) tablet Take 1 tablet by mouth daily.      . Omega 3-6-9 Fatty Acids (OMEGA 3-6-9 COMPLEX PO) Take 1 capsule by mouth daily.      Marland Kitchen omeprazole (PRILOSEC) 20 MG capsule Take 1 capsule (20 mg total) by mouth daily. (Patient taking differently: Take 20 mg by mouth daily as needed. ) 90 capsule 1   No facility-administered medications prior to visit.    Allergies  Allergen Reactions  . Codeine Nausea And Vomiting  . Lisinopril-Hydrochlorothiazide Hives    REACTION: lip swelling,facial swelling,rash.    Review of Systems  Constitutional: Positive for malaise/fatigue. Negative for fever.  HENT: Negative for congestion.   Eyes: Negative for blurred vision.  Respiratory: Positive for sputum production and shortness of breath.   Cardiovascular: Negative for chest pain, palpitations and leg swelling.  Gastrointestinal: Negative for nausea, abdominal pain and blood in stool.  Genitourinary: Negative for dysuria and frequency.  Musculoskeletal: Negative for falls.  Skin: Negative for rash.  Neurological: Negative for dizziness, loss of consciousness and headaches.  Endo/Heme/Allergies: Negative for environmental allergies.  Psychiatric/Behavioral: Negative for depression. The patient is not nervous/anxious.        Objective:    Physical Exam  Constitutional: She is oriented to person, place, and time. She appears well-developed and well-nourished. No distress.  HENT:  Head: Normocephalic and atraumatic.  Nose: Nose normal.  Eyes: Right eye exhibits no discharge. Left eye exhibits no discharge.  Neck: Normal range of motion. Neck supple.  Cardiovascular: Normal rate and regular rhythm.   No murmur heard. Pulmonary/Chest: Effort normal and breath sounds normal.  Abdominal: Soft. Bowel sounds are normal. There is no tenderness.  Musculoskeletal: She exhibits no edema.    Neurological: She is alert and oriented to person, place, and time.  Skin: Skin is warm and dry.  Psychiatric: She has a normal mood and affect.  Nursing note and vitals reviewed.   BP 162/92 mmHg  Pulse 64  Temp(Src) 98.5 F (36.9 C) (Oral)  Ht 4' 11.5" (1.511 m)  Wt 228 lb 8 oz (103.647 kg)  BMI 45.40 kg/m2  SpO2 94% Wt Readings from Last 3 Encounters:  03/05/16 228 lb 8 oz (103.647 kg)  11/30/15 226 lb 3.2 oz (102.604 kg)  11/23/15 229 lb 6.4 oz (104.055 kg)     Lab Results  Component Value Date   WBC 8.4 11/22/2015   HGB 13.3 11/22/2015   HCT 40.6 11/22/2015   PLT 245.0 11/22/2015   GLUCOSE 102* 11/22/2015   CHOL 171 11/22/2015   TRIG 41.0 11/22/2015   HDL 46.30 11/22/2015   LDLCALC 116* 11/22/2015   ALT 13 11/22/2015   AST 17 11/22/2015   NA 140 11/22/2015   K 3.7 11/22/2015   CL 105 11/22/2015   CREATININE 0.75 11/22/2015   BUN 15 11/22/2015   CO2 30 11/22/2015   TSH 1.48 11/22/2015   INR 1.06 01/10/2015   HGBA1C  12/15/2009    5.5 (NOTE) The ADA recommends the following therapeutic goal for glycemic control related to Hgb A1c measurement: Goal of therapy: <6.5 Hgb A1c  Reference: American Diabetes Association: Clinical Practice Recommendations 2010, Diabetes Care, 2010, 33: (Suppl  1).    Lab Results  Component Value Date   TSH 1.48 11/22/2015   Lab Results  Component Value Date   WBC 8.4 11/22/2015   HGB 13.3 11/22/2015   HCT 40.6 11/22/2015   MCV 90.7 11/22/2015   PLT 245.0 11/22/2015   Lab Results  Component Value Date   NA 140 11/22/2015   K 3.7 11/22/2015   CO2 30 11/22/2015   GLUCOSE 102* 11/22/2015   BUN 15 11/22/2015   CREATININE 0.75 11/22/2015   BILITOT 0.5 11/22/2015   ALKPHOS 82 11/22/2015   AST 17 11/22/2015   ALT 13 11/22/2015   PROT 7.4 11/22/2015   ALBUMIN 4.0 11/22/2015   CALCIUM 9.2 11/22/2015   ANIONGAP 7 01/10/2015   GFR 98.77 11/22/2015   Lab Results  Component Value Date   CHOL 171 11/22/2015   Lab  Results  Component Value Date   HDL 46.30 11/22/2015   Lab Results  Component Value Date   LDLCALC 116* 11/22/2015   Lab Results  Component Value Date   TRIG 41.0 11/22/2015   Lab Results  Component Value Date   CHOLHDL 4 11/22/2015   Lab Results  Component Value Date   HGBA1C  12/15/2009    5.5 (NOTE) The ADA recommends the following therapeutic goal for glycemic control related to Hgb A1c measurement: Goal of therapy: <6.5 Hgb A1c  Reference: American Diabetes Association: Clinical Practice Recommendations 2010, Diabetes Care, 2010, 33: (Suppl  1).       Assessment & Plan:   Problem List Items Addressed This Visit    Obstructive sleep apnea    Uses CPAP nightly, feels it dries her out and is contributing to her mucus build up and causes her feeling tight and winded.      Relevant Orders   TSH   CBC   Lipid panel   Comprehensive metabolic panel   Hemoglobin A1c   Hyperlipidemia, mild    Encouraged heart healthy diet, increase exercise, avoid trans fats, consider a krill oil cap daily      Relevant Orders   TSH   CBC   Lipid panel   Comprehensive metabolic panel   Hemoglobin A1c   Hyperglycemia     minimize simple carbs. Increase exercise as tolerated.       Relevant Orders   TSH   CBC   Lipid panel   Comprehensive metabolic panel   Hemoglobin A1c   Essential hypertension - Primary    Fought with husband this am. She did not sleep  well last night, numerous family members very ill including a sister with dementia and a 79 year aunt who needs her care. seh feels all of these things are affecting her BP. At home she was 144/84. She will check it daily at home and let us know numbers so we can adjust meds if need      Relevant Orders   TSH   CBC   Lipid panel   Comprehensive metabolic panel   Hemoglobin A1c   Asthma, mild persistent    Waking up SOB secondary to increased PND and mucus most mornings despite morning.       Relevant Orders   TSH    CBC   Lipid panel   Comprehensive metabolic panel   Hemoglobin A1c   Allergic urticaria    Continue Singulair prn and Zyrtec and Flonase prn, encouraged to try using all 3 consistently to see if it helps      Relevant Orders   TSH   CBC   Lipid panel   Comprehensive metabolic panel   Hemoglobin A1c    Other Visit Diagnoses    Need for viral immunization        Relevant Medications    Zoster Vaccine Live, PF, (ZOSTAVAX) 96295 UNT/0.65ML injection       I am having Ms. Toral start on Zoster Vaccine Live (PF). I am also having her maintain her multivitamin, Vitamin D, Omega 3-6-9 Fatty Acids (OMEGA 3-6-9 COMPLEX PO), cetirizine, b complex vitamins, ibuprofen, montelukast, omeprazole, ipratropium, albuterol, losartan, Azelastine-Fluticasone, fluticasone furoate-vilanterol, and meloxicam.  Meds ordered this encounter  Medications  . Zoster Vaccine Live, PF, (ZOSTAVAX) 28413 UNT/0.65ML injection    Sig: Inject 19,400 Units into the skin once.    Dispense:  1 each    Refill:  0     Penni Homans, MD

## 2016-03-05 NOTE — Assessment & Plan Note (Addendum)
Waking up SOB secondary to increased PND and mucus most mornings despite morning.

## 2016-03-21 ENCOUNTER — Ambulatory Visit: Payer: Commercial Managed Care - HMO | Admitting: Internal Medicine

## 2016-03-26 ENCOUNTER — Ambulatory Visit: Payer: Commercial Managed Care - HMO | Admitting: Internal Medicine

## 2016-05-17 ENCOUNTER — Other Ambulatory Visit: Payer: Self-pay | Admitting: Family Medicine

## 2016-05-17 MED ORDER — MELOXICAM 15 MG PO TABS: 15.0000 mg | ORAL_TABLET | Freq: Every day | ORAL | 0 refills | Status: DC

## 2016-05-17 MED ORDER — ALBUTEROL SULFATE HFA 108 (90 BASE) MCG/ACT IN AERS: INHALATION_SPRAY | RESPIRATORY_TRACT | 1 refills | Status: DC

## 2016-05-17 MED ORDER — LOSARTAN POTASSIUM 100 MG PO TABS: 100.0000 mg | ORAL_TABLET | Freq: Every day | ORAL | 1 refills | Status: DC

## 2016-05-21 ENCOUNTER — Encounter: Payer: Self-pay | Admitting: Student

## 2016-06-07 ENCOUNTER — Ambulatory Visit (INDEPENDENT_AMBULATORY_CARE_PROVIDER_SITE_OTHER): Payer: Commercial Managed Care - HMO | Admitting: Family Medicine

## 2016-06-07 VITALS — BP 162/82 | HR 66 | Temp 98.9°F | Wt 225.0 lb

## 2016-06-07 DIAGNOSIS — E785 Hyperlipidemia, unspecified: Secondary | ICD-10-CM

## 2016-06-07 DIAGNOSIS — R739 Hyperglycemia, unspecified: Secondary | ICD-10-CM

## 2016-06-07 DIAGNOSIS — J3089 Other allergic rhinitis: Secondary | ICD-10-CM

## 2016-06-07 DIAGNOSIS — J309 Allergic rhinitis, unspecified: Secondary | ICD-10-CM

## 2016-06-07 DIAGNOSIS — J4531 Mild persistent asthma with (acute) exacerbation: Secondary | ICD-10-CM | POA: Diagnosis not present

## 2016-06-07 DIAGNOSIS — I10 Essential (primary) hypertension: Secondary | ICD-10-CM

## 2016-06-07 DIAGNOSIS — J302 Other seasonal allergic rhinitis: Secondary | ICD-10-CM

## 2016-06-07 LAB — COMPREHENSIVE METABOLIC PANEL
ALBUMIN: 4 g/dL (ref 3.5–5.2)
ALT: 13 U/L (ref 0–35)
AST: 15 U/L (ref 0–37)
Alkaline Phosphatase: 79 U/L (ref 39–117)
BILIRUBIN TOTAL: 0.4 mg/dL (ref 0.2–1.2)
BUN: 15 mg/dL (ref 6–23)
CALCIUM: 9 mg/dL (ref 8.4–10.5)
CO2: 33 meq/L — AB (ref 19–32)
CREATININE: 0.76 mg/dL (ref 0.40–1.20)
Chloride: 105 mEq/L (ref 96–112)
GFR: 97.12 mL/min (ref >60.00)
Glucose, Bld: 98 mg/dL (ref 70–99)
Potassium: 4.2 mEq/L (ref 3.5–5.1)
Sodium: 143 mEq/L (ref 135–145)
Total Protein: 7.3 g/dL (ref 6.0–8.3)

## 2016-06-07 LAB — CBC
HEMATOCRIT: 39.7 % (ref 36.0–46.0)
HEMOGLOBIN: 13.3 g/dL (ref 12.0–15.0)
MCHC: 33.6 g/dL (ref 30.0–36.0)
MCV: 88.6 fl (ref 78.0–100.0)
PLATELETS: 245 10*3/uL (ref 150.0–400.0)
RBC: 4.48 Mil/uL (ref 3.87–5.11)
RDW: 13.1 % (ref 11.5–15.5)
WBC: 8.8 10*3/uL (ref 4.0–10.5)

## 2016-06-07 LAB — LIPID PANEL
CHOLESTEROL: 170 mg/dL (ref 0–200)
HDL: 40.3 mg/dL (ref >39.00)
LDL Cholesterol: 122 mg/dL — ABNORMAL HIGH (ref 0–99)
NonHDL: 129.22
TRIGLYCERIDES: 38 mg/dL (ref 0.0–149.0)
Total CHOL/HDL Ratio: 4
VLDL: 7.6 mg/dL (ref 0.0–40.0)

## 2016-06-07 LAB — HEMOGLOBIN A1C: Hgb A1c MFr Bld: 5.4 % (ref 4.6–6.5)

## 2016-06-07 LAB — TSH: TSH: 1.79 u[IU]/mL (ref 0.35–4.50)

## 2016-06-07 MED ORDER — METOPROLOL SUCCINATE ER 25 MG PO TB24: 25.0000 mg | ORAL_TABLET | Freq: Every day | ORAL | 3 refills | Status: DC

## 2016-06-07 NOTE — Patient Instructions (Addendum)
Omron blood pressure cuffs NOW probiotics 10 strain cap 1 cap daily    Hypertension Hypertension, commonly called high blood pressure, is when the force of blood pumping through your arteries is too strong. Your arteries are the blood vessels that carry blood from your heart throughout your body. A blood pressure reading consists of a higher number over a lower number, such as 110/72. The higher number (systolic) is the pressure inside your arteries when your heart pumps. The lower number (diastolic) is the pressure inside your arteries when your heart relaxes. Ideally you want your blood pressure below 120/80. Hypertension forces your heart to work harder to pump blood. Your arteries may become narrow or stiff. Having untreated or uncontrolled hypertension can cause heart attack, stroke, kidney disease, and other problems. RISK FACTORS Some risk factors for high blood pressure are controllable. Others are not.  Risk factors you cannot control include:   Race. You may be at higher risk if you are African American.  Age. Risk increases with age.  Gender. Men are at higher risk than women before age 53 years. After age 24, women are at higher risk than men. Risk factors you can control include:  Not getting enough exercise or physical activity.  Being overweight.  Getting too much fat, sugar, calories, or salt in your diet.  Drinking too much alcohol. SIGNS AND SYMPTOMS Hypertension does not usually cause signs or symptoms. Extremely high blood pressure (hypertensive crisis) may cause headache, anxiety, shortness of breath, and nosebleed. DIAGNOSIS To check if you have hypertension, your health care provider will measure your blood pressure while you are seated, with your arm held at the level of your heart. It should be measured at least twice using the same arm. Certain conditions can cause a difference in blood pressure between your right and left arms. A blood pressure reading that  is higher than normal on one occasion does not mean that you need treatment. If it is not clear whether you have high blood pressure, you may be asked to return on a different day to have your blood pressure checked again. Or, you may be asked to monitor your blood pressure at home for 1 or more weeks. TREATMENT Treating high blood pressure includes making lifestyle changes and possibly taking medicine. Living a healthy lifestyle can help lower high blood pressure. You may need to change some of your habits. Lifestyle changes may include:  Following the DASH diet. This diet is high in fruits, vegetables, and whole grains. It is low in salt, red meat, and added sugars.  Keep your sodium intake below 2,300 mg per day.  Getting at least 30-45 minutes of aerobic exercise at least 4 times per week.  Losing weight if necessary.  Not smoking.  Limiting alcoholic beverages.  Learning ways to reduce stress. Your health care provider may prescribe medicine if lifestyle changes are not enough to get your blood pressure under control, and if one of the following is true:  You are 21-30 years of age and your systolic blood pressure is above 140.  You are 4 years of age or older, and your systolic blood pressure is above 150.  Your diastolic blood pressure is above 90.  You have diabetes, and your systolic blood pressure is over [id number] or your diastolic blood pressure is over 90.  You have kidney disease and your blood pressure is above 140/90.  You have heart disease and your blood pressure is above 140/90. Your personal target blood pressure  may vary depending on your medical conditions, your age, and other factors. HOME CARE INSTRUCTIONS  Have your blood pressure rechecked as directed by your health care provider.   Take medicines only as directed by your health care provider. Follow the directions carefully. Blood pressure medicines must be taken as prescribed. The medicine does not work as  well when you skip doses. Skipping doses also puts you at risk for problems.  Do not smoke.   Monitor your blood pressure at home as directed by your health care provider. SEEK MEDICAL CARE IF:   You think you are having a reaction to medicines taken.  You have recurrent headaches or feel dizzy.  You have swelling in your ankles.  You have trouble with your vision. SEEK IMMEDIATE MEDICAL CARE IF:  You develop a severe headache or confusion.  You have unusual weakness, numbness, or feel faint.  You have severe chest or abdominal pain.  You vomit repeatedly.  You have trouble breathing. MAKE SURE YOU:   Understand these instructions.  Will watch your condition.  Will get help right away if you are not doing well or get worse.   This information is not intended to replace advice given to you by your health care provider. Make sure you discuss any questions you have with your health care provider.   Document Released: 09/10/2005 Document Revised: 01/25/2015 Document Reviewed: 07/03/2013 Elsevier Interactive Patient Education [employer].

## 2016-06-07 NOTE — Progress Notes (Signed)
Pre visit review using our clinic review tool, if applicable. No additional management support is needed unless otherwise documented below in the visit note. 

## 2016-06-07 NOTE — Assessment & Plan Note (Signed)
Poorly controlled will alter medications, encouraged DASH diet, minimize caffeine and obtain adequate sleep. Report concerning symptoms and follow up as directed and as needed. Add Metoprolol XL 25 mg po daily

## 2016-06-17 NOTE — Assessment & Plan Note (Signed)
No recent flares no changes

## 2016-06-17 NOTE — Assessment & Plan Note (Signed)
minimize simple carbs. Increase exercise as tolerated.  

## 2016-06-17 NOTE — Assessment & Plan Note (Signed)
Encouraged heart healthy diet, increase exercise, avoid trans fats, consider a krill oil cap daily 

## 2016-06-17 NOTE — Assessment & Plan Note (Signed)
Encouraged Zyrtec daily prn

## 2016-06-17 NOTE — Progress Notes (Signed)
Patient ID: Anne Barnes, female   DOB: [birth year], 70 y.o.   MRN: [id number]   Subjective:    Patient ID: Anne Barnes, female    DOB: [birth year], 70 y.o.   MRN: [id number]  Chief Complaint  Patient presents with  . Hypertension    follow up    HPI Patient is in today for follow up. No recent illness or hospitalization. Is noting some progressive right sided hearing loss. Also notes some nasal congestion. No green sputum or fevers. Denies CP/palp/SOB/HA/fevers/GI or GU c/o. Taking meds as prescribed  Past Medical History:  Diagnosis Date  . ALLERGIC RHINITIS   . Allergic urticaria 08/01/2014   Patient reports allergies to shrimp, egg whites, tomatoes, dairy   . Chiari malformation    Noted MRI brain 09/2013 - s/p neuro eval for same  . DIVERTICULOSIS, COLON   . GERD   . H/O measles   . H/O mumps   . History of chicken pox   . HYPERTENSION   . IBS (irritable bowel syndrome) 05/26/2015  . OBSTRUCTIVE SLEEP APNEA 12/2008 dx   noncompliant with CPAP qhs  . Sciatica    right side  . 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 ABDOMINAL HYSTERECTOMY  1986  . BREAST SURGERY  1973   Breast biopsy  . UMBILICAL HERNIA REPAIR      Family History  Problem Relation Age of Onset  . Arthritis Mother     died of complications from hip surgery  . Arthritis Father   . Kidney disease Father   . Pneumonia Father   . Heart disease Father   . Lupus Daughter   . Rheum arthritis Daughter   . Sjogren's syndrome Daughter   . Fibromyalgia Daughter   . Diabetes Brother   . Heart disease Brother   . Cancer Brother     throat and lung  . Alcohol abuse Brother   . Kidney disease Sister     dialysis  . Obesity Sister   . Hypertension Maternal Grandfather   . Hypertension Sister   . Pneumonia Brother   . Hypertension Daughter   . Graves' disease Daughter   . Hypertension Daughter   . Alcohol abuse Other     parent  . Ataxia Neg Hx   .  Chorea Neg Hx   . Dementia Neg Hx   . Mental retardation Neg Hx   . Migraines Neg Hx   . Multiple sclerosis Neg Hx   . Neurofibromatosis Neg Hx   . Neuropathy Neg Hx   . Parkinsonism Neg Hx   . Seizures Neg Hx   . Stroke Neg Hx     Social History   Social History  . Marital status: Married    Spouse name: N/A  . Number of children: N/A  . Years of education: N/A   Occupational History  . retired    Social History Main Topics  . Smoking status: Never Smoker  . Smokeless tobacco: Not on file     Comment: Married, lives with spouse, dtr 2 g-kids.  . Alcohol use No     Comment: rare  . Drug use: No  . Sexual activity: Not on file     Comment: lives with husband, no dietary restrictions avoid foods allergic to, retired   Other Topics Concern  . Not on file   Social History Narrative  . No narrative on file    Outpatient Medications  Prior to Visit  Medication Sig Dispense Refill  . albuterol (VENTOLIN HFA) 108 (90 Base) MCG/ACT inhaler 2 puffs every 4 hours if needed 3 Inhaler 1  . b complex vitamins tablet Take 1 tablet by mouth daily.    . cetirizine (ZYRTEC) 10 MG tablet Take 1 tablet (10 mg total) by mouth daily. 30 tablet 11  . Cholecalciferol (VITAMIN D) 2000 UNITS CAPS Take by mouth daily.     Marland Kitchen ipratropium (ATROVENT) 0.06 % nasal spray 1-2 puffs each nostril twice daily as needed 15 mL 11  . losartan (COZAAR) 100 MG tablet Take 1 tablet (100 mg total) by mouth daily. 90 tablet 1  . montelukast (SINGULAIR) 10 MG tablet TAKE ONE TABLET BY MOUTH AT BEDTIME 30 tablet 0  . Multiple Vitamin (MULTIVITAMIN) tablet Take 1 tablet by mouth daily.      . Omega 3-6-9 Fatty Acids (OMEGA 3-6-9 COMPLEX PO) Take 1 capsule by mouth daily.      Marland Kitchen omeprazole (PRILOSEC) 20 MG capsule Take 1 capsule (20 mg total) by mouth daily. (Patient taking differently: Take 20 mg by mouth daily as needed. ) 90 capsule 1  . Zoster Vaccine Live, PF, (ZOSTAVAX) 52841 UNT/0.65ML injection Inject  19,400 Units into the skin once. 1 each 0  . albuterol (VENTOLIN HFA) 108 (90 Base) MCG/ACT inhaler 2 puffs every 4 hours if needed 3 Inhaler 1  . Azelastine-Fluticasone (DYMISTA) 137-50 MCG/ACT SUSP Place 1-2 puffs into the nose at bedtime. 1 Bottle prn  . fluticasone furoate-vilanterol (BREO ELLIPTA) 100-25 MCG/INH AEPB Inhale 1 puff then rinse mouth once daily 60 each 12  . ibuprofen (ADVIL,MOTRIN) 200 MG tablet Take 200 mg by mouth every 6 (six) hours as needed.    . meloxicam (MOBIC) 15 MG tablet Take 1 tablet (15 mg total) by mouth daily. 89 tablet 0   No facility-administered medications prior to visit.     Allergies  Allergen Reactions  . Codeine Nausea And Vomiting  . Lisinopril-Hydrochlorothiazide Hives    REACTION: lip swelling,facial swelling,rash.    Review of Systems  Constitutional: Negative for fever and malaise/fatigue.  HENT: Positive for hearing loss. Negative for congestion, ear discharge, ear pain and tinnitus.   Eyes: Negative for blurred vision.  Respiratory: Negative for shortness of breath.   Cardiovascular: Negative for chest pain, palpitations and leg swelling.  Gastrointestinal: Negative for abdominal pain, blood in stool and nausea.  Genitourinary: Negative for dysuria and frequency.  Musculoskeletal: Negative for falls.  Skin: Negative for rash.  Neurological: Negative for dizziness, loss of consciousness and headaches.  Endo/Heme/Allergies: Negative for environmental allergies.  Psychiatric/Behavioral: Negative for depression. The patient is not nervous/anxious.        Objective:    Physical Exam  Constitutional: She is oriented to person, place, and time. She appears well-developed and well-nourished. No distress.  HENT:  Head: Normocephalic and atraumatic.  Nose: Nose normal.  Eyes: Right eye exhibits no discharge. Left eye exhibits no discharge.  Neck: Normal range of motion. Neck supple.  Cardiovascular: Normal rate and regular rhythm.     No murmur heard. Pulmonary/Chest: Effort normal and breath sounds normal.  Abdominal: Soft. Bowel sounds are normal. There is no tenderness.  Musculoskeletal: She exhibits no edema.  Neurological: She is alert and oriented to person, place, and time.  Skin: Skin is warm and dry.  Psychiatric: She has a normal mood and affect.  Nursing note and vitals reviewed.   BP (!) 162/82   Pulse 66  Temp 98.9 F (37.2 C) (Oral)   Wt 225 lb (102.1 kg)   SpO2 99%   BMI 44.68 kg/m  Wt Readings from Last 3 Encounters:  06/07/16 225 lb (102.1 kg)  03/05/16 228 lb 8 oz (103.6 kg)  11/30/15 226 lb 3.2 oz (102.6 kg)     Lab Results  Component Value Date   WBC 8.8 06/07/2016   HGB 13.3 06/07/2016   HCT 39.7 06/07/2016   PLT 245.0 06/07/2016   GLUCOSE 98 06/07/2016   CHOL 170 06/07/2016   TRIG 38.0 06/07/2016   HDL 40.30 06/07/2016   LDLCALC 122 (H) 06/07/2016   ALT 13 06/07/2016   AST 15 06/07/2016   NA 143 06/07/2016   K 4.2 06/07/2016   CL 105 06/07/2016   CREATININE 0.76 06/07/2016   BUN 15 06/07/2016   CO2 33 (H) 06/07/2016   TSH 1.79 06/07/2016   INR 1.06 01/10/2015   HGBA1C 5.4 06/07/2016    Lab Results  Component Value Date   TSH 1.79 06/07/2016   Lab Results  Component Value Date   WBC 8.8 06/07/2016   HGB 13.3 06/07/2016   HCT 39.7 06/07/2016   MCV 88.6 06/07/2016   PLT 245.0 06/07/2016   Lab Results  Component Value Date   NA 143 06/07/2016   K 4.2 06/07/2016   CO2 33 (H) 06/07/2016   GLUCOSE 98 06/07/2016   BUN 15 06/07/2016   CREATININE 0.76 06/07/2016   BILITOT 0.4 06/07/2016   ALKPHOS 79 06/07/2016   AST 15 06/07/2016   ALT 13 06/07/2016   PROT 7.3 06/07/2016   ALBUMIN 4.0 06/07/2016   CALCIUM 9.0 06/07/2016   ANIONGAP 7 01/10/2015   GFR 97.12 06/07/2016   Lab Results  Component Value Date   CHOL 170 06/07/2016   Lab Results  Component Value Date   HDL 40.30 06/07/2016   Lab Results  Component Value Date   LDLCALC 122 (H) 06/07/2016    Lab Results  Component Value Date   TRIG 38.0 06/07/2016   Lab Results  Component Value Date   CHOLHDL 4 06/07/2016   Lab Results  Component Value Date   HGBA1C 5.4 06/07/2016       Assessment & Plan:   Problem List Items Addressed This Visit    Essential hypertension - Primary    Poorly controlled will alter medications, encouraged DASH diet, minimize caffeine and obtain adequate sleep. Report concerning symptoms and follow up as directed and as needed. Add Metoprolol XL 25 mg po daily      Relevant Medications   metoprolol succinate (TOPROL-XL) 25 MG 24 hr tablet   Other Relevant Orders   TSH (Completed)   CBC (Completed)   Comprehensive metabolic panel (Completed)   Seasonal and perennial allergic rhinitis    Encouraged Zyrtec daily prn      Hyperlipidemia, mild    Encouraged heart healthy diet, increase exercise, avoid trans fats, consider a krill oil cap daily      Relevant Medications   metoprolol succinate (TOPROL-XL) 25 MG 24 hr tablet   Other Relevant Orders   Lipid panel (Completed)   Asthma, mild persistent    No recent flares no changes      Hyperglycemia     minimize simple carbs. Increase exercise as tolerated.       Relevant Orders   Hemoglobin A1c (Completed)    Other Visit Diagnoses   None.     I have discontinued Ms. Bos's ibuprofen, Azelastine-Fluticasone, fluticasone furoate-vilanterol,  and meloxicam. I am also having her start on metoprolol succinate. Additionally, I am having her maintain her multivitamin, Vitamin D, Omega 3-6-9 Fatty Acids (OMEGA 3-6-9 COMPLEX PO), cetirizine, b complex vitamins, montelukast, omeprazole, ipratropium, Zoster Vaccine Live (PF), losartan, and albuterol.  Meds ordered this encounter  Medications  . metoprolol succinate (TOPROL-XL) 25 MG 24 hr tablet    Sig: Take 1 tablet (25 mg total) by mouth daily.    Dispense:  30 tablet    Refill:  3     Penni Homans, MD

## 2016-08-27 ENCOUNTER — Other Ambulatory Visit: Payer: Self-pay | Admitting: Family Medicine

## 2016-09-06 ENCOUNTER — Ambulatory Visit: Payer: Commercial Managed Care - HMO | Admitting: Family Medicine

## 2016-09-10 ENCOUNTER — Ambulatory Visit: Payer: Commercial Managed Care - HMO | Admitting: Family Medicine

## 2017-01-10 ENCOUNTER — Telehealth: Payer: Self-pay | Admitting: *Deleted

## 2017-01-10 NOTE — Telephone Encounter (Signed)
LM for patient to return call to schedule AWV.   

## 2017-01-17 NOTE — Telephone Encounter (Signed)
Patient returned call and stated she will schedule AWV when she comes in to see Dr. Charlett Blake on Friday 01/18/17

## 2017-01-18 ENCOUNTER — Ambulatory Visit (INDEPENDENT_AMBULATORY_CARE_PROVIDER_SITE_OTHER): Payer: Medicare HMO | Admitting: Family Medicine

## 2017-01-18 ENCOUNTER — Encounter: Payer: Self-pay | Admitting: Family Medicine

## 2017-01-18 ENCOUNTER — Other Ambulatory Visit (HOSPITAL_COMMUNITY)
Admission: RE | Admit: 2017-01-18 | Discharge: 2017-01-18 | Disposition: A | Discharge status: Home / Self Care | Payer: Medicare HMO | Source: Ambulatory Visit | Attending: Family Medicine | Admitting: Family Medicine

## 2017-01-18 DIAGNOSIS — N898 Other specified noninflammatory disorders of vagina: Secondary | ICD-10-CM | POA: Diagnosis not present

## 2017-01-18 DIAGNOSIS — N760 Acute vaginitis: Secondary | ICD-10-CM | POA: Diagnosis not present

## 2017-01-18 DIAGNOSIS — H9190 Unspecified hearing loss, unspecified ear: Secondary | ICD-10-CM | POA: Diagnosis not present

## 2017-01-18 DIAGNOSIS — Z113 Encounter for screening for infections with a predominantly sexual mode of transmission: Secondary | ICD-10-CM | POA: Diagnosis not present

## 2017-01-18 DIAGNOSIS — I10 Essential (primary) hypertension: Secondary | ICD-10-CM | POA: Insufficient documentation

## 2017-01-18 DIAGNOSIS — R739 Hyperglycemia, unspecified: Secondary | ICD-10-CM | POA: Diagnosis not present

## 2017-01-18 DIAGNOSIS — Z01419 Encounter for gynecological examination (general) (routine) without abnormal findings: Secondary | ICD-10-CM | POA: Diagnosis not present

## 2017-01-18 DIAGNOSIS — K589 Irritable bowel syndrome without diarrhea: Secondary | ICD-10-CM

## 2017-01-18 DIAGNOSIS — E785 Hyperlipidemia, unspecified: Secondary | ICD-10-CM

## 2017-01-18 HISTORY — DX: Acute vaginitis: N76.0

## 2017-01-18 HISTORY — DX: Unspecified hearing loss, unspecified ear: H91.90

## 2017-01-18 NOTE — Progress Notes (Signed)
Pre visit review using our clinic review tool, if applicable. No additional management support is needed unless otherwise documented below in the visit note. 

## 2017-01-18 NOTE — Assessment & Plan Note (Addendum)
Flared for past 6 weeks. Change probiotics and add a fiber supplement twice daily. If no imrpovement will need to return to her gastroenterologist, Dr Collene Mares early for further consideration

## 2017-01-18 NOTE — Assessment & Plan Note (Signed)
Add Atenolol 25 mg po daily recheck bp in 3-4 weeks with RN

## 2017-01-18 NOTE — Assessment & Plan Note (Signed)
Did hearing screen at [facility] which confirms hearing loss at high frequency

## 2017-01-18 NOTE — Patient Instructions (Signed)
Citrucel take daily to help with the IBS symptoms    Diet for Irritable Bowel Syndrome When you have irritable bowel syndrome (IBS), the foods you eat and your eating habits are very important. IBS may cause various symptoms, such as abdominal pain, constipation, or diarrhea. Choosing the right foods can help ease discomfort caused by these symptoms. Work with your health care provider and dietitian to find the best eating plan to help control your symptoms. What general guidelines do I need to follow?  Keep a food diary. This will help you identify foods that cause symptoms. Write down:  What you eat and when.  What symptoms you have.  When symptoms occur in relation to your meals.  Avoid foods that cause symptoms. Talk with your dietitian about other ways to get the same nutrients that are in these foods.  Eat more foods that contain fiber. Take a fiber supplement if directed by your dietitian.  Eat your meals slowly, in a relaxed setting.  Aim to eat 5-6 small meals per day. Do not skip meals.  Drink enough fluids to keep your urine clear or pale yellow.  Ask your health care provider if you should take an over-the-counter probiotic during flare-ups to help restore healthy gut bacteria.  If you have cramping or diarrhea, try making your meals low in fat and high in carbohydrates. Examples of carbohydrates are pasta, rice, whole grain breads and cereals, fruits, and vegetables.  If dairy products cause your symptoms to flare up, try eating less of them. You might be able to handle yogurt better than other dairy products because it contains bacteria that help with digestion. What foods are not recommended? The following are some foods and drinks that may worsen your symptoms:  Fatty foods, such as [place] fries.  Milk products, such as cheese or ice cream.  Chocolate.  Alcohol.  Products with caffeine, such as coffee.  Carbonated drinks, such as soda. The items listed  above may not be a complete list of foods and beverages to avoid. Contact your dietitian for more information.  What foods are good sources of fiber? Your health care provider or dietitian may recommend that you eat more foods that contain fiber. Fiber can help reduce constipation and other IBS symptoms. Add foods with fiber to your diet a little at a time so that your body can get used to them. Too much fiber at once might cause gas and swelling of your abdomen. The following are some foods that are good sources of fiber:  Apples.  Peaches.  Pears.  Berries.  Figs.  Broccoli (raw).  Cabbage.  Carrots.  Raw peas.  Kidney beans.  Lima beans.  Whole grain bread.  Whole grain cereal. Where to find more information: [employer] for Functional Gastrointestinal Disorders: www.iffgd.[employer] of Diabetes and Digestive and Kidney Diseases: NetworkAffair.co.za.aspx This information is not intended to replace advice given to you by your health care provider. Make sure you discuss any questions you have with your health care provider. Document Released: 12/01/2003 Document Revised: 02/16/2016 Document Reviewed: 12/11/2013 Elsevier Interactive Patient Education  2017 [employer].

## 2017-01-18 NOTE — Assessment & Plan Note (Addendum)
Vaginal discharge off and on for past 6 weeks. No bleeding or painful but smelly. Mild abdominal discomfort as well check UA with C and s and pelvic infection testing

## 2017-01-18 NOTE — Progress Notes (Signed)
Subjective:  I acted as a Education administrator for Dr. Charlett Blake. [place], [place]   Patient ID: Anne Barnes, female    DOB: Sep 04, 1946, 71 y.o.   MRN: [id number]  Chief Complaint  Patient presents with  . Follow-up    HPI  Patient is in today for follow up. Patient has stopped her metoprolol, bp is elevated. She monitors blood pressure at home.  She also c/o IBS symptoms, and a vaginal on going 6 weeks. She notes an increase in diarrhea episodes but denies any bloody or tarry stool no fevers or chills. No polyuria or polydipsia. No recent febrile illness or hospitalizaitons. Denies CP/palp/SOB/HA/congestion/fevers/GU c/o. Taking meds as prescribed  Patient Care Team: Mosie Lukes, MD as PCP - General (Family Medicine) Juanita Craver, MD (Gastroenterology) Deneise Lever, MD as Consulting Physician (Pulmonary Disease)   Past Medical History:  Diagnosis Date  . ALLERGIC RHINITIS   . Allergic urticaria 08/01/2014   Patient reports allergies to shrimp, egg whites, tomatoes, dairy   . Chiari malformation    Noted MRI brain 09/2013 - s/p neuro eval for same  . DIVERTICULOSIS, COLON   . GERD   . H/O measles   . H/O mumps   . Hearing loss 01/18/2017  . History of chicken pox   . HYPERTENSION   . IBS (irritable bowel syndrome) 05/26/2015  . OBSTRUCTIVE SLEEP APNEA 12/2008 dx   noncompliant with CPAP qhs  . Sciatica    right side  . 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 Vaginitis 01/18/2017    Past Surgical History:  Procedure Laterality Date  . ABDOMINAL HYSTERECTOMY  1986  . BREAST SURGERY  1973   Breast biopsy  . UMBILICAL HERNIA REPAIR      Family History  Problem Relation Age of Onset  . Arthritis Mother     died of complications from hip surgery  . Arthritis Father   . Kidney disease Father   . Pneumonia Father   . Heart disease Father   . Lupus Daughter   . Rheum arthritis Daughter   . Sjogren's syndrome Daughter   . Fibromyalgia Daughter   . Diabetes Brother   . Heart disease  Brother   . Cancer Brother     throat and lung  . Alcohol abuse Brother   . Kidney disease Sister     dialysis  . Obesity Sister   . Hypertension Maternal Grandfather   . Hypertension Sister   . Pneumonia Brother   . Hypertension Daughter   . Graves' disease Daughter   . Hypertension Daughter   . Alcohol abuse Other     parent  . Ataxia Neg Hx   . Chorea Neg Hx   . Dementia Neg Hx   . Mental retardation Neg Hx   . Migraines Neg Hx   . Multiple sclerosis Neg Hx   . Neurofibromatosis Neg Hx   . Neuropathy Neg Hx   . Parkinsonism Neg Hx   . Seizures Neg Hx   . Stroke Neg Hx     Social History   Social History  . Marital status: Married    Spouse name: N/A  . Number of children: N/A  . Years of education: N/A   Occupational History  . retired    Social History Main Topics  . Smoking status: Never Smoker  . Smokeless tobacco: Not on file     Comment: Married, lives with spouse, dtr 2 g-kids.  . Alcohol use No  Comment: rare  . Drug use: No  . Sexual activity: Not on file     Comment: lives with husband, no dietary restrictions avoid foods allergic to, retired   Other Topics Concern  . Not on file   Social History Narrative  . No narrative on file    Outpatient Medications Prior to Visit  Medication Sig Dispense Refill  . albuterol (VENTOLIN HFA) 108 (90 Base) MCG/ACT inhaler 2 puffs every 4 hours if needed 3 Inhaler 1  . b complex vitamins tablet Take 1 tablet by mouth daily.    . cetirizine (ZYRTEC) 10 MG tablet Take 1 tablet (10 mg total) by mouth daily. 30 tablet 11  . Cholecalciferol (VITAMIN D) 2000 UNITS CAPS Take by mouth daily.     Marland Kitchen ipratropium (ATROVENT) 0.06 % nasal spray 1-2 puffs each nostril twice daily as needed 15 mL 11  . losartan (COZAAR) 100 MG tablet Take 1 tablet (100 mg total) by mouth daily. 90 tablet 1  . montelukast (SINGULAIR) 10 MG tablet TAKE ONE TABLET BY MOUTH AT BEDTIME 30 tablet 0  . Multiple Vitamin (MULTIVITAMIN) tablet  Take 1 tablet by mouth daily.      . Omega 3-6-9 Fatty Acids (OMEGA 3-6-9 COMPLEX PO) Take 1 capsule by mouth daily.      Marland Kitchen omeprazole (PRILOSEC) 20 MG capsule TAKE ONE CAPSULE BY MOUTH ONCE DAILY 90 capsule 1  . Zoster Vaccine Live, PF, (ZOSTAVAX) 97673 UNT/0.65ML injection Inject 19,400 Units into the skin once. 1 each 0  . metoprolol succinate (TOPROL-XL) 25 MG 24 hr tablet Take 1 tablet (25 mg total) by mouth daily. 30 tablet 3   No facility-administered medications prior to visit.     Allergies  Allergen Reactions  . Codeine Nausea And Vomiting  . Lisinopril-Hydrochlorothiazide Hives    REACTION: lip swelling,facial swelling,rash.  . Metoprolol Other (See Comments)    Does not feel well on it     Review of Systems  Constitutional: Positive for malaise/fatigue. Negative for fever.  HENT: Negative for congestion.   Eyes: Negative for blurred vision.  Respiratory: Negative for cough and shortness of breath.   Cardiovascular: Negative for chest pain, palpitations and leg swelling.  Gastrointestinal: Positive for abdominal pain and diarrhea. Negative for blood in stool, constipation, melena and vomiting.  Musculoskeletal: Negative for back pain.  Skin: Negative for rash.  Neurological: Negative for loss of consciousness and headaches.  Psychiatric/Behavioral: Positive for depression. The patient is nervous/anxious.        Objective:    Physical Exam  Constitutional: She is oriented to person, place, and time. She appears well-developed and well-nourished. No distress.  HENT:  Head: Normocephalic and atraumatic.  Eyes: Conjunctivae are normal.  Neck: Normal range of motion. No thyromegaly present.  Cardiovascular: Normal rate and regular rhythm.   Pulmonary/Chest: Breath sounds normal. She is in respiratory distress. She has no wheezes.  Abdominal: Soft. Bowel sounds are normal. She exhibits no distension and no mass. There is tenderness. There is no rebound and no guarding.    Musculoskeletal: Normal range of motion. She exhibits no edema or deformity.  Neurological: She is alert and oriented to person, place, and time.  Skin: Skin is warm and dry. She is not diaphoretic.  Psychiatric: She has a normal mood and affect.    BP (!) 180/82 (BP Location: Left Arm, Patient Position: Sitting, Cuff Size: Large)   Pulse 79   Temp 98.3 F (36.8 C) (Oral)   Wt 232 lb (  105.2 kg)   SpO2 97%   BMI 46.07 kg/m  Wt Readings from Last 3 Encounters:  01/18/17 232 lb (105.2 kg)  06/07/16 225 lb (102.1 kg)  03/05/16 228 lb 8 oz (103.6 kg)   BP Readings from Last 3 Encounters:  01/18/17 (!) 180/82  06/07/16 (!) 162/82  03/05/16 (!) 148/84     Immunization History  Administered Date(s) Administered  . Influenza, High Dose Seasonal PF 07/15/2013  . Influenza,inj,Quad PF,36+ Mos 08/29/2015  . Pneumococcal Conjugate-13 11/30/2015  . Pneumococcal Polysaccharide-23 02/26/2013  . Td 09/25/2003  . Tdap 08/29/2015    Health Maintenance  Topic Date Due  . INFLUENZA VACCINE  04/24/2017  . MAMMOGRAM  11/21/2017  . COLONOSCOPY  10/22/2019  . TETANUS/TDAP  08/28/2025  . DEXA SCAN  Completed  . Hepatitis C Screening  Completed  . PNA vac Low Risk Adult  Completed    Lab Results  Component Value Date   WBC 8.8 06/07/2016   HGB 13.3 06/07/2016   HCT 39.7 06/07/2016   PLT 245.0 06/07/2016   GLUCOSE 98 06/07/2016   CHOL 170 06/07/2016   TRIG 38.0 06/07/2016   HDL 40.30 06/07/2016   LDLCALC 122 (H) 06/07/2016   ALT 13 06/07/2016   AST 15 06/07/2016   NA 143 06/07/2016   K 4.2 06/07/2016   CL 105 06/07/2016   CREATININE 0.76 06/07/2016   BUN 15 06/07/2016   CO2 33 (H) 06/07/2016   TSH 1.79 06/07/2016   INR 1.06 01/10/2015   HGBA1C 5.4 06/07/2016    Lab Results  Component Value Date   TSH 1.79 06/07/2016   Lab Results  Component Value Date   WBC 8.8 06/07/2016   HGB 13.3 06/07/2016   HCT 39.7 06/07/2016   MCV 88.6 06/07/2016   PLT 245.0 06/07/2016    Lab Results  Component Value Date   NA 143 06/07/2016   K 4.2 06/07/2016   CO2 33 (H) 06/07/2016   GLUCOSE 98 06/07/2016   BUN 15 06/07/2016   CREATININE 0.76 06/07/2016   BILITOT 0.4 06/07/2016   ALKPHOS 79 06/07/2016   AST 15 06/07/2016   ALT 13 06/07/2016   PROT 7.3 06/07/2016   ALBUMIN 4.0 06/07/2016   CALCIUM 9.0 06/07/2016   ANIONGAP 7 01/10/2015   GFR 97.12 06/07/2016   Lab Results  Component Value Date   CHOL 170 06/07/2016   Lab Results  Component Value Date   HDL 40.30 06/07/2016   Lab Results  Component Value Date   LDLCALC 122 (H) 06/07/2016   Lab Results  Component Value Date   TRIG 38.0 06/07/2016   Lab Results  Component Value Date   CHOLHDL 4 06/07/2016   Lab Results  Component Value Date   HGBA1C 5.4 06/07/2016         Assessment & Plan:   Problem List Items Addressed This Visit    Essential hypertension    Add Atenolol 25 mg po daily recheck bp in 3-4 weeks with RN       Relevant Orders   CBC   Comp Met (CMET)   Lipid panel   TSH   Hyperlipidemia, mild    Encouraged heart healthy diet, increase exercise, avoid trans fats, consider a krill oil cap daily      Relevant Orders   Lipid panel   IBS (irritable bowel syndrome)    Flared for past 6 weeks. Change probiotics and add a fiber supplement twice daily. If no imrpovement will need to return  to her gastroenterologist, Dr Collene Mares early for further consideration      Hyperglycemia    hgba1c acceptable, minimize simple carbs. Increase exercise as tolerated.      Relevant Orders   Hemoglobin A1C   Vaginitis    Vaginal discharge off and on for past 6 weeks. No bleeding or painful but smelly. Mild abdominal discomfort as well check UA with C and s and pelvic infection testing       Relevant Orders   Cytology - PAP   Hearing loss    Did hearing screen at [facility] which confirms hearing loss at high frequency         I have discontinued Ms. Ballew's metoprolol succinate. I  am also having her maintain her multivitamin, Vitamin D, Omega 3-6-9 Fatty Acids (OMEGA 3-6-9 COMPLEX PO), cetirizine, b complex vitamins, montelukast, ipratropium, Zoster Vaccine Live (PF), losartan, albuterol, and omeprazole.  No orders of the defined types were placed in this encounter.   CMA served as Education administrator during this visit. History, Physical and Plan performed by medical provider. Documentation and orders reviewed and attested to.  Penni Homans, MD

## 2017-01-18 NOTE — Assessment & Plan Note (Signed)
hgba1c acceptable, minimize simple carbs. Increase exercise as tolerated.  

## 2017-01-18 NOTE — Assessment & Plan Note (Signed)
Encouraged heart healthy diet, increase exercise, avoid trans fats, consider a krill oil cap daily 

## 2017-01-21 LAB — CYTOLOGY - PAP
Adequacy: ABSENT
Bacterial vaginitis: NEGATIVE
CANDIDA VAGINITIS: NEGATIVE
Chlamydia: NEGATIVE
Diagnosis: NEGATIVE
Neisseria Gonorrhea: NEGATIVE
TRICH (WINDOWPATH): NEGATIVE

## 2017-01-22 ENCOUNTER — Other Ambulatory Visit: Payer: Self-pay | Admitting: Family Medicine

## 2017-01-22 MED ORDER — ATENOLOL 25 MG PO TABS: 25.0000 mg | ORAL_TABLET | Freq: Every day | ORAL | 0 refills | Status: DC

## 2017-01-22 MED ORDER — OMEPRAZOLE 20 MG PO CPDR: 20.0000 mg | DELAYED_RELEASE_CAPSULE | Freq: Every day | ORAL | 3 refills | Status: DC

## 2017-01-22 MED ORDER — LOSARTAN POTASSIUM 100 MG PO TABS: 100.0000 mg | ORAL_TABLET | Freq: Every day | ORAL | 3 refills | Status: DC

## 2017-01-23 ENCOUNTER — Other Ambulatory Visit (INDEPENDENT_AMBULATORY_CARE_PROVIDER_SITE_OTHER): Payer: Medicare HMO

## 2017-01-23 DIAGNOSIS — I10 Essential (primary) hypertension: Secondary | ICD-10-CM

## 2017-01-23 NOTE — Addendum Note (Signed)
Addended by: Harl Bowie on: 01/23/2017 07:06 PM   Modules accepted: Orders

## 2017-02-19 ENCOUNTER — Emergency Department (HOSPITAL_BASED_OUTPATIENT_CLINIC_OR_DEPARTMENT_OTHER)
Admission: EM | Admit: 2017-02-19 | Discharge: 2017-02-19 | Disposition: A | Discharge status: Home / Self Care | Payer: Medicare HMO | Attending: Emergency Medicine | Admitting: Emergency Medicine

## 2017-02-19 ENCOUNTER — Encounter (HOSPITAL_BASED_OUTPATIENT_CLINIC_OR_DEPARTMENT_OTHER): Payer: Self-pay | Admitting: Emergency Medicine

## 2017-02-19 ENCOUNTER — Emergency Department (HOSPITAL_BASED_OUTPATIENT_CLINIC_OR_DEPARTMENT_OTHER): Payer: Medicare HMO

## 2017-02-19 ENCOUNTER — Ambulatory Visit (INDEPENDENT_AMBULATORY_CARE_PROVIDER_SITE_OTHER): Payer: Medicare HMO | Admitting: *Deleted

## 2017-02-19 VITALS — BP 190/92 | HR 61

## 2017-02-19 DIAGNOSIS — Z79899 Other long term (current) drug therapy: Secondary | ICD-10-CM | POA: Diagnosis not present

## 2017-02-19 DIAGNOSIS — I10 Essential (primary) hypertension: Secondary | ICD-10-CM

## 2017-02-19 DIAGNOSIS — R51 Headache: Secondary | ICD-10-CM | POA: Diagnosis not present

## 2017-02-19 DIAGNOSIS — R0981 Nasal congestion: Secondary | ICD-10-CM | POA: Insufficient documentation

## 2017-02-19 DIAGNOSIS — M5412 Radiculopathy, cervical region: Secondary | ICD-10-CM | POA: Insufficient documentation

## 2017-02-19 DIAGNOSIS — J45909 Unspecified asthma, uncomplicated: Secondary | ICD-10-CM | POA: Insufficient documentation

## 2017-02-19 DIAGNOSIS — R519 Headache, unspecified: Secondary | ICD-10-CM

## 2017-02-19 DIAGNOSIS — M542 Cervicalgia: Secondary | ICD-10-CM | POA: Diagnosis not present

## 2017-02-19 DIAGNOSIS — R410 Disorientation, unspecified: Secondary | ICD-10-CM | POA: Insufficient documentation

## 2017-02-19 DIAGNOSIS — R600 Localized edema: Secondary | ICD-10-CM | POA: Diagnosis not present

## 2017-02-19 DIAGNOSIS — R531 Weakness: Secondary | ICD-10-CM | POA: Diagnosis not present

## 2017-02-19 DIAGNOSIS — R2990 Unspecified symptoms and signs involving the nervous system: Secondary | ICD-10-CM

## 2017-02-19 LAB — CBC WITH DIFFERENTIAL/PLATELET
BASOS ABS: 0 10*3/uL (ref 0.0–0.1)
Basophils Relative: 0 %
EOS ABS: 0.4 10*3/uL (ref 0.0–0.7)
EOS PCT: 4 %
HCT: 40.9 % (ref 36.0–46.0)
Hemoglobin: 13.6 g/dL (ref 12.0–15.0)
Lymphocytes Relative: 19 %
Lymphs Abs: 1.8 10*3/uL (ref 0.7–4.0)
MCH: 30.4 pg (ref 26.0–34.0)
MCHC: 33.3 g/dL (ref 30.0–36.0)
MCV: 91.3 fL (ref 78.0–100.0)
MONO ABS: 0.7 10*3/uL (ref 0.1–1.0)
Monocytes Relative: 8 %
Neutro Abs: 6.3 10*3/uL (ref 1.7–7.7)
Neutrophils Relative %: 69 %
PLATELETS: 187 10*3/uL (ref 150–400)
RBC: 4.48 MIL/uL (ref 3.87–5.11)
RDW: 13.7 % (ref 11.5–15.5)
WBC: 9.2 10*3/uL (ref 4.0–10.5)

## 2017-02-19 LAB — URINALYSIS, MICROSCOPIC (REFLEX)

## 2017-02-19 LAB — BASIC METABOLIC PANEL
Anion gap: 7 (ref 5–15)
BUN: 17 mg/dL (ref 6–20)
CALCIUM: 8.8 mg/dL — AB (ref 8.9–10.3)
CO2: 26 mmol/L (ref 22–32)
CREATININE: 0.71 mg/dL (ref 0.44–1.00)
Chloride: 106 mmol/L (ref 101–111)
GFR calc Af Amer: >60 mL/min (ref >60)
GFR calc non Af Amer: >60 mL/min (ref >60)
GLUCOSE: 103 mg/dL — AB (ref 65–99)
Potassium: 3.9 mmol/L (ref 3.5–5.1)
SODIUM: 139 mmol/L (ref 135–145)

## 2017-02-19 LAB — URINALYSIS, ROUTINE W REFLEX MICROSCOPIC
Bilirubin Urine: NEGATIVE
GLUCOSE, UA: NEGATIVE mg/dL
HGB URINE DIPSTICK: NEGATIVE
KETONES UR: NEGATIVE mg/dL
Nitrite: NEGATIVE
PROTEIN: NEGATIVE mg/dL
Specific Gravity, Urine: 1.006 (ref 1.005–1.030)
pH: 7 (ref 5.0–8.0)

## 2017-02-19 MED ORDER — TRAMADOL HCL 50 MG PO TABS: 50.0000 mg | ORAL_TABLET | Freq: Four times a day (QID) | ORAL | 0 refills | Status: DC | PRN

## 2017-02-19 MED FILL — traMADol HCL 50 MG TABS: 50 | 4 days supply | Qty: 15 | Fill #0

## 2017-02-19 NOTE — ED Provider Notes (Signed)
[facility] Provider Note   CSN: [id number] Arrival date & time: 02/19/17  2694     History   Chief Complaint Chief Complaint  Patient presents with  . Headache  . Weakness    HPI Anne Barnes is a 71 y.o. female.  Patient sent down from primary care office for further evaluation. Patient's had a one-week history of headache and left-sided neck pain radiating into the left arm. Associated with left arm weakness. In addition patient has pain radiating to left side of face and left arm. Patient has no lower extremity weakness. Patient is being followed by primary care Dr. for hypertension started on new meds about a month ago and today's follow-up was to see how they're responding. Patient states her blood pressure still a bit high at home. No fevers no nausea vomiting. No visual changes. Patient has had some difficulty with congestion due to allergies. That is per then persist.      Past Medical History:  Diagnosis Date  . ALLERGIC RHINITIS   . Allergic urticaria 08/01/2014   Patient reports allergies to shrimp, egg whites, tomatoes, dairy   . Chiari malformation    Noted MRI brain 09/2013 - s/p neuro eval for same  . DIVERTICULOSIS, COLON   . GERD   . H/O measles   . H/O mumps   . Hearing loss 01/18/2017  . History of chicken pox   . HYPERTENSION   . IBS (irritable bowel syndrome) 05/26/2015  . OBSTRUCTIVE SLEEP APNEA 12/2008 dx   noncompliant with CPAP qhs  . Sciatica    right side  . 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 Vaginitis 01/18/2017    Patient Active Problem List   Diagnosis Date Noted  . Vaginitis 01/18/2017  . Hearing loss 01/18/2017  . Hyperglycemia 03/05/2016  . 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  . Hyperlipidemia, mild 05/26/2015  . Female bladder prolapse 05/26/2015  . IBS (irritable bowel syndrome) 05/26/2015  . History of chicken pox   . Allergic urticaria 08/01/2014  . Food allergy 08/01/2014  . Tinnitus 03/14/2014  .  Osteoarthritis   . Nonspecific abnormal electrocardiogram (ECG) (EKG)   . Headache(784.0) 10/21/2013  . Chiari malformation   . GERD 11/10/2010  . Obstructive sleep apnea 08/28/2010  . Essential hypertension 08/28/2010  . Seasonal and perennial allergic rhinitis 08/28/2010  . DIVERTICULOSIS, COLON 08/28/2010    Past Surgical History:  Procedure Laterality Date  . ABDOMINAL HYSTERECTOMY  1986  . BREAST SURGERY  1973   Breast biopsy  . UMBILICAL HERNIA REPAIR      OB History    No data available       Home Medications    Prior to Admission medications   Medication Sig Start Date End Date Taking? Authorizing Provider  albuterol (VENTOLIN HFA) 108 (90 Base) MCG/ACT inhaler 2 puffs every 4 hours if needed 05/17/16   Mosie Lukes, MD  atenolol (TENORMIN) 25 MG tablet Take 1 tablet (25 mg total) by mouth daily. 01/22/17   Mosie Lukes, MD  b complex vitamins tablet Take 1 tablet by mouth daily.    [provider]  cetirizine (ZYRTEC) 10 MG tablet Take 1 tablet (10 mg total) by mouth daily. 07/15/13   Rowe Clack, MD  Cholecalciferol (VITAMIN D) 2000 UNITS CAPS Take by mouth daily.     [provider]  ipratropium (ATROVENT) 0.06 % nasal spray 1-2 puffs each nostril twice daily as needed 07/26/15   Deneise Lever, MD  losartan (COZAAR) 100 MG tablet Take 1 tablet (100 mg total) by mouth daily. 01/22/17   Mosie Lukes, MD  montelukast (SINGULAIR) 10 MG tablet TAKE ONE TABLET BY MOUTH AT BEDTIME 05/17/15   Baird Lyons D, MD  Multiple Vitamin (MULTIVITAMIN) tablet Take 1 tablet by mouth daily.      [provider]  Omega 3-6-9 Fatty Acids (OMEGA 3-6-9 COMPLEX PO) Take 1 capsule by mouth daily.      [provider]  omeprazole (PRILOSEC) 20 MG capsule Take 1 capsule (20 mg total) by mouth daily. 01/22/17   Mosie Lukes, MD  traMADol (ULTRAM) 50 MG tablet Take 1 tablet (50 mg total) by mouth every 6 (six) hours as needed. 02/19/17    Fredia Sorrow, MD  Zoster Vaccine Live, PF, (ZOSTAVAX) 80998 UNT/0.65ML injection Inject 19,400 Units into the skin once. 03/05/16   Mosie Lukes, MD    Family History Family History  Problem Relation Age of Onset  . Arthritis Mother        died of complications from hip surgery  . Arthritis Father   . Kidney disease Father   . Pneumonia Father   . Heart disease Father   . Lupus Daughter   . Rheum arthritis Daughter   . Sjogren's syndrome Daughter   . Fibromyalgia Daughter   . Diabetes Brother   . Heart disease Brother   . Cancer Brother        throat and lung  . Alcohol abuse Brother   . Kidney disease Sister        dialysis  . Obesity Sister   . Hypertension Maternal Grandfather   . Hypertension Sister   . Pneumonia Brother   . Hypertension Daughter   . Graves' disease Daughter   . Hypertension Daughter   . Alcohol abuse Other        parent  . Ataxia Neg Hx   . Chorea Neg Hx   . Dementia Neg Hx   . Mental retardation Neg Hx   . Migraines Neg Hx   . Multiple sclerosis Neg Hx   . Neurofibromatosis Neg Hx   . Neuropathy Neg Hx   . Parkinsonism Neg Hx   . Seizures Neg Hx   . Stroke Neg Hx     Social History Social History  Substance Use Topics  . Smoking status: Never Smoker  . Smokeless tobacco: Not on file     Comment: Married, lives with spouse, dtr 2 g-kids.  . Alcohol use No     Comment: rare     Allergies   Codeine; Lisinopril-hydrochlorothiazide; Metoprolol; and Other   Review of Systems Review of Systems  Constitutional: Negative for fever.  HENT: Positive for congestion.   Eyes: Negative for visual disturbance.  Respiratory: Negative for shortness of breath.   Cardiovascular: Negative for chest pain.  Gastrointestinal: Negative for abdominal pain, nausea and vomiting.  Genitourinary: Negative for dysuria.  Musculoskeletal: Positive for neck pain.  Skin: Negative for rash.  Neurological: Positive for facial asymmetry, weakness and  headaches. Negative for dizziness, seizures, syncope and speech difficulty.  Hematological: Does not bruise/bleed easily.  Psychiatric/Behavioral: Positive for confusion.     Physical Exam Updated Vital Signs BP (!) 159/53   Pulse (!) 53   Temp 98.2 F (36.8 C) (Oral)   Resp 12   Ht 1.511 m (4' 11.5")   Wt 104.8 kg (231 lb)   SpO2 100%   BMI 45.88 kg/m   Physical Exam  Constitutional: She is oriented to person, place, and time. She appears well-developed and well-nourished. No distress.  HENT:  Head: Normocephalic and atraumatic.  Mouth/Throat: Oropharynx is clear and moist.  Eyes: Conjunctivae and EOM are normal. Pupils are equal, round, and reactive to light.  Neck: Normal range of motion. Neck supple.  Pain with range of motion to the neck to the left side. No posterior palpable point tenderness.  Cardiovascular: Normal rate, regular rhythm and normal heart sounds.   Pulmonary/Chest: Effort normal and breath sounds normal.  Abdominal: Soft. Bowel sounds are normal. There is no tenderness.  Musculoskeletal: Normal range of motion. She exhibits edema.  Radial pulses 2+.  Neurological: She is alert and oriented to person, place, and time. No cranial nerve deficit or sensory deficit. She exhibits normal muscle tone. Coordination normal.  No significant weakness to the left arm no facial weakness. No significant focal neuro deficit.  Skin: Skin is warm. Capillary refill takes less than 2 seconds.  Nursing note and vitals reviewed.    ED Treatments / Results  Labs (all labs ordered are listed, but only abnormal results are displayed) Labs Reviewed  URINALYSIS, ROUTINE W REFLEX MICROSCOPIC - Abnormal; Notable for the following:       Result Value   APPearance CLOUDY (*)    Leukocytes, UA TRACE (*)    All other components within normal limits  BASIC METABOLIC PANEL - Abnormal; Notable for the following:    Glucose, Bld 103 (*)    Calcium 8.8 (*)    All other components  within normal limits  URINALYSIS, MICROSCOPIC (REFLEX) - Abnormal; Notable for the following:    Bacteria, UA RARE (*)    Squamous Epithelial / LPF 6-30 (*)    All other components within normal limits  CBC WITH DIFFERENTIAL/PLATELET  URINALYSIS, ROUTINE W REFLEX MICROSCOPIC    EKG  EKG Interpretation  Date/Time:  Tuesday Feb 19 2017 09:43:58 EDT Ventricular Rate:  63 PR Interval:    QRS Duration: 95 QT Interval:  446 QTC Calculation: 457 R Axis:   33 Text Interpretation:  Sinus rhythm No significant change since last tracing Confirmed by Fredia Sorrow [phone]) on 02/19/2017 9:49:09 AM       Radiology Ct Head Wo Contrast  Result Date: 02/19/2017 CLINICAL DATA:  Headache beginning last week with pain in the left head and face. Left arm weakness. EXAM: CT HEAD WITHOUT CONTRAST CT CERVICAL SPINE WITHOUT CONTRAST TECHNIQUE: Multidetector CT imaging of the head and cervical spine was performed following the standard protocol without intravenous contrast. Multiplanar CT image reconstructions of the cervical spine were also generated. COMPARISON:  Brain MRI 09/30/2013 FINDINGS: CT HEAD FINDINGS Brain: Chiari I malformation is again noted with similar degree of inferior tonsillar displacement, approximately 13 mm. There is no evidence of hydrocephalus. No acute infarct, intracranial hemorrhage, mass, midline shift, or extra-axial fluid collection is identified. Vascular: No hyperdense vessel. Skull: No fracture or suspicious osseous lesion. Sinuses/Orbits: Visualized paranasal sinuses and mastoid air cells are clear. Visualized orbits are unremarkable. Other: None. CT CERVICAL SPINE FINDINGS Alignment: No subluxation. Skull base and vertebrae: No acute fracture or destructive osseous process. Soft tissues and spinal canal: No prevertebral swelling. Disc levels: Mild disc degeneration from C5-T2. Very mild disc space narrowing and spurring at C4-5 as well. Mild-to-moderate left facet arthrosis at  C2-3 and C3-4. No high-grade osseous spinal or neural foraminal stenosis. Upper chest: Clear lung apices. Other: Partially visualized thyroid goiter with bilateral enlargement, slightly greater on the left  with underlying heterogeneity and small calcifications. Associated minimal rightward tracheal deviation and likely mild extension into the superior mediastinum, incompletely imaged. Retropharyngeal course of the carotid arteries bilaterally which contact one another in the midline. Mild bilateral carotid artery calcified atherosclerosis. IMPRESSION: 1. No evidence of acute intracranial abnormality. 2. Chiari I malformation. 3. No acute osseous abnormality identified in the cervical spine. Mild disc and facet degeneration. 4. Thyroid goiter. Electronically Signed   By: Logan Bores M.D.   On: 02/19/2017 10:49   Ct Cervical Spine Wo Contrast  Result Date: 02/19/2017 CLINICAL DATA:  Headache beginning last week with pain in the left head and face. Left arm weakness. EXAM: CT HEAD WITHOUT CONTRAST CT CERVICAL SPINE WITHOUT CONTRAST TECHNIQUE: Multidetector CT imaging of the head and cervical spine was performed following the standard protocol without intravenous contrast. Multiplanar CT image reconstructions of the cervical spine were also generated. COMPARISON:  Brain MRI 09/30/2013 FINDINGS: CT HEAD FINDINGS Brain: Chiari I malformation is again noted with similar degree of inferior tonsillar displacement, approximately 13 mm. There is no evidence of hydrocephalus. No acute infarct, intracranial hemorrhage, mass, midline shift, or extra-axial fluid collection is identified. Vascular: No hyperdense vessel. Skull: No fracture or suspicious osseous lesion. Sinuses/Orbits: Visualized paranasal sinuses and mastoid air cells are clear. Visualized orbits are unremarkable. Other: None. CT CERVICAL SPINE FINDINGS Alignment: No subluxation. Skull base and vertebrae: No acute fracture or destructive osseous process. Soft  tissues and spinal canal: No prevertebral swelling. Disc levels: Mild disc degeneration from C5-T2. Very mild disc space narrowing and spurring at C4-5 as well. Mild-to-moderate left facet arthrosis at C2-3 and C3-4. No high-grade osseous spinal or neural foraminal stenosis. Upper chest: Clear lung apices. Other: Partially visualized thyroid goiter with bilateral enlargement, slightly greater on the left with underlying heterogeneity and small calcifications. Associated minimal rightward tracheal deviation and likely mild extension into the superior mediastinum, incompletely imaged. Retropharyngeal course of the carotid arteries bilaterally which contact one another in the midline. Mild bilateral carotid artery calcified atherosclerosis. IMPRESSION: 1. No evidence of acute intracranial abnormality. 2. Chiari I malformation. 3. No acute osseous abnormality identified in the cervical spine. Mild disc and facet degeneration. 4. Thyroid goiter. Electronically Signed   By: Logan Bores M.D.   On: 02/19/2017 10:49    Procedures Procedures (including critical care time)  Medications Ordered in ED Medications - No data to display   Initial Impression / Assessment and Plan / ED Course  I have reviewed the triage vital signs and the nursing notes.  Pertinent labs & imaging results that were available during my care of the patient were reviewed by me and considered in my medical decision making (see chart for details).     The patient's symptoms seem to be more consistent with a left-sided cervical radiculopathy. Due to the neck pain pain with range of motion to the neck to the left. No posterior point tenderness. No nuchal rigidity. Pain radiation also up into the left side of the face and left arm suggestive of this. Head CT negative symptoms of been present now for 1 week. No significant neuro focal deficits on exam. Patient's blood pressure was high upon presentation but improved while here. But systolics  were still around 150. Tighter control will be necessary. Patient will follow-up with her primary care doctor for blood pressure rechecks and also consideration for MRI of the neck and could include MRI of the head. Do not feel his symptoms are consistent with meningitis or an  acute stroke. Based on the duration of the symptoms would expect abnormal findings on CT. However MRI would be helpful to be more complete.  Final Clinical Impressions(s) / ED Diagnoses   Final diagnoses:  Acute intractable headache, unspecified headache type  Essential hypertension  Cervical radicular pain    New Prescriptions New Prescriptions   TRAMADOL (ULTRAM) 50 MG TABLET    Take 1 tablet (50 mg total) by mouth every 6 (six) hours as needed.     Fredia Sorrow, MD 02/19/17 1226

## 2017-02-19 NOTE — ED Notes (Signed)
Patient returned from CT

## 2017-02-19 NOTE — Discharge Instructions (Signed)
Head CT negative today. Symptoms seem to be suggestive of left-sided neck pain radiating to the head and left arm. Could very well be a cervical radiculopathy. Take the tramadol as needed for pain. Follow-up with your doctor for possible MRI brain. As well as follow-up of the high blood pressure.

## 2017-02-19 NOTE — Progress Notes (Signed)
   Subjective:   Anne Barnes is a 71 y.o. female who presents for an Initial Medicare Annual Wellness Visit.  Patient reports last Wednesday (today is Tuesday) she experienced hypertension with associated 'swelling of my head' described as tightness and L sided weakness. BP was 214/96 and 210/102 prior to meds. She ate breakfast and took her medication as usual, then BP came down to 176/83 about 20 minutes later. Since that time, she has continued to experience HA, neck soreness, and weakness of L hand. Patient states 'I don't know what a TIA feels like, but I would swear that's what happened to me.' She also states since then she has not been able to remember her full bank account number like she used to.   She is A&O x3, normally conversant. Face is symmetrical, speech is clear.   Based on above, AWV canceled, PCP informed, and patient advised to go to ED for further evaluation/stat imaging. Pt notified of instructions and verbalized understanding.  Dorrene German, RN

## 2017-02-19 NOTE — ED Notes (Signed)
ED Provider at bedside. 

## 2017-02-19 NOTE — ED Notes (Signed)
Patient transported to CT 

## 2017-02-19 NOTE — ED Triage Notes (Signed)
  Patient had pressure headache last week that caused L head, neck, and shoulder pain.  Patient has allergies and pressure headaches at times so she planned to follow up with Dr. Randel Pigg today.  Pain has not went away since last week but is not as severe.  Patient states she is having issues remembering some information.  Was seen this morning and redirected down here.

## 2017-02-25 ENCOUNTER — Encounter: Payer: Self-pay | Admitting: Family Medicine

## 2017-02-25 ENCOUNTER — Ambulatory Visit (INDEPENDENT_AMBULATORY_CARE_PROVIDER_SITE_OTHER): Payer: Medicare HMO | Admitting: Family Medicine

## 2017-02-25 VITALS — BP 170/80 | HR 54 | Temp 98.7°F | Wt 231.6 lb

## 2017-02-25 DIAGNOSIS — R739 Hyperglycemia, unspecified: Secondary | ICD-10-CM

## 2017-02-25 DIAGNOSIS — E876 Hypokalemia: Secondary | ICD-10-CM

## 2017-02-25 DIAGNOSIS — R001 Bradycardia, unspecified: Secondary | ICD-10-CM

## 2017-02-25 DIAGNOSIS — R519 Headache, unspecified: Secondary | ICD-10-CM

## 2017-02-25 DIAGNOSIS — R0600 Dyspnea, unspecified: Secondary | ICD-10-CM

## 2017-02-25 DIAGNOSIS — R51 Headache: Secondary | ICD-10-CM | POA: Diagnosis not present

## 2017-02-25 DIAGNOSIS — I10 Essential (primary) hypertension: Secondary | ICD-10-CM

## 2017-02-25 DIAGNOSIS — E785 Hyperlipidemia, unspecified: Secondary | ICD-10-CM | POA: Diagnosis not present

## 2017-02-25 HISTORY — DX: Hypocalcemia: E83.51

## 2017-02-25 HISTORY — DX: Bradycardia, unspecified: R00.1

## 2017-02-25 MED ORDER — CHLORTHALIDONE 25 MG PO TABS: 25.0000 mg | ORAL_TABLET | Freq: Every day | ORAL | 3 refills | Status: DC

## 2017-02-25 NOTE — Assessment & Plan Note (Signed)
hgba1c acceptable, minimize simple carbs. Increase exercise as tolerated.  

## 2017-02-25 NOTE — Progress Notes (Signed)
Pre visit review using our clinic review tool, if applicable. No additional management support is needed unless otherwise documented below in the visit note. 

## 2017-02-25 NOTE — Assessment & Plan Note (Signed)
Encouraged heart healthy diet, increase exercise, avoid trans fats, consider a krill oil cap daily 

## 2017-02-25 NOTE — Patient Instructions (Addendum)
If any symptoms with new medications, STOP immediately. You may take a Benadryl if any itch develops. General Headache Without Cause A headache is pain or discomfort felt around the head or neck area. There are many causes and types of headaches. In some cases, the cause may not be found. Follow these instructions at home: Managing pain  Take over-the-counter and prescription medicines only as told by your doctor.  Lie down in a dark, quiet room when you have a headache.  If directed, apply ice to the head and neck area: ? Put ice in a plastic bag. ? Place a towel between your skin and the bag. ? Leave the ice on for 20 minutes, 2-3 times per day.  Use a heating pad or hot shower to apply heat to the head and neck area as told by your doctor.  Keep lights dim if bright lights bother you or make your headaches worse. Eating and drinking  Eat meals on a regular schedule.  Lessen how much alcohol you drink.  Lessen how much caffeine you drink, or stop drinking caffeine. General instructions  Keep all follow-up visits as told by your doctor. This is important.  Keep a journal to find out if certain things bring on headaches. For example, write down: ? What you eat and drink. ? How much sleep you get. ? Any change to your diet or medicines.  Relax by getting a massage or doing other relaxing activities.  Lessen stress.  Sit up straight. Do not tighten (tense) your muscles.  Do not use tobacco products. This includes cigarettes, chewing tobacco, or e-cigarettes. If you need help quitting, ask your doctor.  Exercise regularly as told by your doctor.  Get enough sleep. This often means 7-9 hours of sleep. Contact a doctor if:  Your symptoms are not helped by medicine.  You have a headache that feels different than the other headaches.  You feel sick to your stomach (nauseous) or you throw up (vomit).  You have a fever. Get help right away if:  Your headache becomes  really bad.  You keep throwing up.  You have a stiff neck.  You have trouble seeing.  You have trouble speaking.  You have pain in the eye or ear.  Your muscles are weak or you lose muscle control.  You lose your balance or have trouble walking.  You feel like you will pass out (faint) or you pass out.  You have confusion. This information is not intended to replace advice given to you by your health care provider. Make sure you discuss any questions you have with your health care provider. Document Released: 06/19/2008 Document Revised: 02/16/2016 Document Reviewed: 01/03/2015 Elsevier Interactive Patient Education  [employer].

## 2017-02-25 NOTE — Progress Notes (Signed)
Patient ID: Anne Barnes, female   DOB: 02/26/1946, 71 y.o.   MRN: [id number]   Subjective:  I acted as a Education administrator for Penni Homans, [place], [place]   Patient ID: Anne Barnes, female    DOB: [birth year], 71 y.o.   MRN: [id number]  Chief Complaint  Patient presents with  . Follow-up    ED F/U.  Marland Kitchen Headache  . Otalgia    Headache   This is a chronic problem. The pain is at a severity of 4/10. The pain is moderate. Associated symptoms include ear pain. Pertinent negatives include no back pain, blurred vision, coughing, fever or vomiting. The symptoms are aggravated by unknown. The treatment provided no relief.  Otalgia   There is pain in both ears. This is a new problem. The current episode started in the past 7 days. The problem has been waxing and waning. There has been no fever. The pain is moderate. Associated symptoms include headaches. Pertinent negatives include no coughing, rash or vomiting. The treatment provided no relief.    Patient is in today for an Emergency Department follow up. Patient was seen in the ED on 02/19/2017 for acute intractable headache. Patient states that she hs a Hx of headaches. Also complaining of ear ache in both ears since Thursday. Patient also states that she has been experiencing some difficulty swallowing. Patient states that her blood pressure was extremely high during her ED visit; reaching 944 (systolic). Patient has a Hx of headaches, osteoarthritis, tinnitus, hyperglycemia, mild hyperlipidemia. Patient has no additional acute concerns noted at this moment. No other neurologic symptoms such as weakness or numbness. No syncope, no trauma or head injury. No tinnitus or hearing loss. Denies CP/palp/SOB/congestion/fevers/GI or GU c/o. Taking meds as prescribed  Patient Care Team: Mosie Lukes, MD as PCP - General (Family Medicine) Juanita Craver, MD (Gastroenterology) Deneise Lever, MD as Consulting Physician (Pulmonary Disease)   Past Medical  History:  Diagnosis Date  . ALLERGIC RHINITIS   . Allergic urticaria 08/01/2014   Patient reports allergies to shrimp, egg whites, tomatoes, dairy   . Bradycardia 02/25/2017  . Chiari malformation    Noted MRI brain 09/2013 - s/p neuro eval for same  . DIVERTICULOSIS, COLON   . Frequent headaches   . GERD   . H/O measles   . H/O mumps   . Hearing loss 01/18/2017  . History of chicken pox   . HYPERTENSION   . Hypocalcemia 02/25/2017  . IBS (irritable bowel syndrome) 05/26/2015  . OBSTRUCTIVE SLEEP APNEA 12/2008 dx   noncompliant with CPAP qhs  . Sciatica    right side  . 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 Vaginitis 01/18/2017    Past Surgical History:  Procedure Laterality Date  . ABDOMINAL HYSTERECTOMY  1986  . BREAST SURGERY  1973   Breast biopsy  . UMBILICAL HERNIA REPAIR      Family History  Problem Relation Age of Onset  . Arthritis Mother        died of complications from hip surgery  . Arthritis Father   . Kidney disease Father   . Pneumonia Father   . Heart disease Father   . Lupus Daughter   . Rheum arthritis Daughter   . Sjogren's syndrome Daughter   . Fibromyalgia Daughter   . Diabetes Brother   . Heart disease Brother   . Cancer Brother        throat and lung  . Alcohol abuse Brother   .  Kidney disease Sister        dialysis  . Obesity Sister   . Hypertension Maternal Grandfather   . Hypertension Sister   . Pneumonia Brother   . Hypertension Daughter   . Graves' disease Daughter   . Hypertension Daughter   . Alcohol abuse Other        parent  . Ataxia Neg Hx   . Chorea Neg Hx   . Dementia Neg Hx   . Mental retardation Neg Hx   . Migraines Neg Hx   . Multiple sclerosis Neg Hx   . Neurofibromatosis Neg Hx   . Neuropathy Neg Hx   . Parkinsonism Neg Hx   . Seizures Neg Hx   . Stroke Neg Hx     Social History   Social History  . Marital status: Married    Spouse name: N/A  . Number of children: N/A  . Years of education: N/A   Occupational  History  . retired    Social History Main Topics  . Smoking status: Never Smoker  . Smokeless tobacco: Never Used     Comment: Married, lives with spouse, dtr 2 g-kids.  . Alcohol use No     Comment: rare  . Drug use: No  . Sexual activity: Not on file     Comment: lives with husband, no dietary restrictions avoid foods allergic to, retired   Other Topics Concern  . Not on file   Social History Narrative  . No narrative on file    Outpatient Medications Prior to Visit  Medication Sig Dispense Refill  . albuterol (VENTOLIN HFA) 108 (90 Base) MCG/ACT inhaler 2 puffs every 4 hours if needed 3 Inhaler 1  . atenolol (TENORMIN) 25 MG tablet Take 1 tablet (25 mg total) by mouth daily. 30 tablet 0  . b complex vitamins tablet Take 1 tablet by mouth daily.    . cetirizine (ZYRTEC) 10 MG tablet Take 1 tablet (10 mg total) by mouth daily. 30 tablet 11  . Cholecalciferol (VITAMIN D) 2000 UNITS CAPS Take by mouth daily.     Marland Kitchen ipratropium (ATROVENT) 0.06 % nasal spray 1-2 puffs each nostril twice daily as needed 15 mL 11  . losartan (COZAAR) 100 MG tablet Take 1 tablet (100 mg total) by mouth daily. 90 tablet 3  . montelukast (SINGULAIR) 10 MG tablet TAKE ONE TABLET BY MOUTH AT BEDTIME 30 tablet 0  . Multiple Vitamin (MULTIVITAMIN) tablet Take 1 tablet by mouth daily.      . Omega 3-6-9 Fatty Acids (OMEGA 3-6-9 COMPLEX PO) Take 1 capsule by mouth daily.      Marland Kitchen omeprazole (PRILOSEC) 20 MG capsule Take 1 capsule (20 mg total) by mouth daily. 90 capsule 3  . traMADol (ULTRAM) 50 MG tablet Take 1 tablet (50 mg total) by mouth every 6 (six) hours as needed. 15 tablet 0  . Zoster Vaccine Live, PF, (ZOSTAVAX) 50539 UNT/0.65ML injection Inject 19,400 Units into the skin once. 1 each 0   No facility-administered medications prior to visit.     Allergies  Allergen Reactions  . Codeine Nausea And Vomiting  . Lisinopril-Hydrochlorothiazide Hives    REACTION: lip swelling,facial swelling,rash.  .  Metoprolol Other (See Comments)    Does not feel well on it   . Other     Patient states she is allergic to Shrimp, egg whites, tomatoes and dairy products.  Says they make her feel unwell but do not cause hives, SOB or anaphylaxis.  She just avoids eating them because they can make her have a headache.    Review of Systems  Constitutional: Negative for fever and malaise/fatigue.  HENT: Positive for ear pain. Negative for congestion.   Eyes: Negative for blurred vision.  Respiratory: Negative for cough and shortness of breath.   Cardiovascular: Negative for chest pain, palpitations and leg swelling.  Gastrointestinal: Negative for vomiting.  Musculoskeletal: Negative for back pain.  Skin: Negative for rash.  Neurological: Positive for headaches. Negative for loss of consciousness.       Objective:    Physical Exam  Constitutional: She is oriented to person, place, and time. She appears well-developed and well-nourished. No distress.  HENT:  Head: Normocephalic and atraumatic.  Eyes: Conjunctivae are normal.  Neck: Normal range of motion. No thyromegaly present.  Cardiovascular: Normal rate and regular rhythm.   Pulmonary/Chest: Effort normal and breath sounds normal. She has no wheezes.  Abdominal: Soft. Bowel sounds are normal. There is no tenderness.  Musculoskeletal: She exhibits no edema or deformity.  Neurological: She is alert and oriented to person, place, and time.  Skin: Skin is warm and dry. She is not diaphoretic.  Psychiatric: She has a normal mood and affect.    BP (!) 170/80 (BP Location: Left Wrist, Cuff Size: Normal)   Pulse (!) 54   Temp 98.7 F (37.1 C) (Oral)   Wt 231 lb 9.6 oz (105.1 kg)   SpO2 96% Comment: RA  BMI 45.99 kg/m  Wt Readings from Last 3 Encounters:  02/25/17 231 lb 9.6 oz (105.1 kg)  02/19/17 231 lb (104.8 kg)  01/18/17 232 lb (105.2 kg)   BP Readings from Last 3 Encounters:  02/25/17 (!) 170/80  02/19/17 (!) 159/53  02/19/17 (!)  190/92     Immunization History  Administered Date(s) Administered  . Influenza, High Dose Seasonal PF 07/15/2013  . Influenza,inj,Quad PF,36+ Mos 08/29/2015  . Pneumococcal Conjugate-13 11/30/2015  . Pneumococcal Polysaccharide-23 02/26/2013  . Td 09/25/2003  . Tdap 08/29/2015    Health Maintenance  Topic Date Due  . INFLUENZA VACCINE  04/24/2017  . MAMMOGRAM  11/21/2017  . COLONOSCOPY  10/22/2019  . TETANUS/TDAP  08/28/2025  . DEXA SCAN  Completed  . Hepatitis C Screening  Completed  . PNA vac Low Risk Adult  Completed    Lab Results  Component Value Date   WBC 9.4 02/27/2017   HGB 13.5 02/27/2017   HCT 40.4 02/27/2017   PLT 242.0 02/27/2017   GLUCOSE 86 02/27/2017   CHOL 175 02/27/2017   TRIG 58.0 02/27/2017   HDL 39.50 02/27/2017   LDLCALC 124 (H) 02/27/2017   ALT 12 02/27/2017   AST 14 02/27/2017   NA 141 02/27/2017   K 3.9 02/27/2017   CL 105 02/27/2017   CREATININE 0.82 02/27/2017   BUN 15 02/27/2017   CO2 30 02/27/2017   TSH 1.31 02/27/2017   INR 1.06 01/10/2015   HGBA1C 5.6 02/27/2017    Lab Results  Component Value Date   TSH 1.31 02/27/2017   Lab Results  Component Value Date   WBC 9.4 02/27/2017   HGB 13.5 02/27/2017   HCT 40.4 02/27/2017   MCV 89.8 02/27/2017   PLT 242.0 02/27/2017   Lab Results  Component Value Date   NA 141 02/27/2017   K 3.9 02/27/2017   CO2 30 02/27/2017   GLUCOSE 86 02/27/2017   BUN 15 02/27/2017   CREATININE 0.82 02/27/2017   BILITOT 0.5 02/27/2017   ALKPHOS 77  02/27/2017   AST 14 02/27/2017   ALT 12 02/27/2017   PROT 7.4 02/27/2017   ALBUMIN 4.1 02/27/2017   CALCIUM 9.6 02/27/2017   ANIONGAP 7 02/19/2017   GFR 88.77 02/27/2017   Lab Results  Component Value Date   CHOL 175 02/27/2017   Lab Results  Component Value Date   HDL 39.50 02/27/2017   Lab Results  Component Value Date   LDLCALC 124 (H) 02/27/2017   Lab Results  Component Value Date   TRIG 58.0 02/27/2017   Lab Results  Component  Value Date   CHOLHDL 4 02/27/2017   Lab Results  Component Value Date   HGBA1C 5.6 02/27/2017         Assessment & Plan:   Problem List Items Addressed This Visit    Essential hypertension    Continue Atenolol and add Chlorthalidone. Check labs and BP in next 3-4 weeks.       Relevant Medications   chlorthalidone (HYGROTON) 25 MG tablet   Other Relevant Orders   CBC (Completed)   Comprehensive metabolic panel (Completed)   TSH (Completed)   Headache    Encouraged increased hydration, 64 ounces of clear fluids daily. Minimize alcohol and caffeine. Eat small frequent meals with lean proteins and complex carbs. Avoid high and low blood sugars. Get adequate sleep, 7-8 hours a night. Needs exercise daily preferably in the morning.      Hyperglycemia    hgba1c acceptable, minimize simple carbs. Increase exercise as tolerated.      Relevant Orders   Hemoglobin A1c (Completed)   Ambulatory referral to Cardiology   Hypocalcemia   Relevant Orders   VITAMIN D 25 Hydroxy (Vit-D Deficiency, Fractures) (Completed)   Bradycardia   Relevant Orders   Ambulatory referral to Cardiology    Other Visit Diagnoses    Hypokalemia    -  Primary   Relevant Orders   Ambulatory referral to Cardiology   Hyperlipidemia, unspecified hyperlipidemia type       Relevant Medications   chlorthalidone (HYGROTON) 25 MG tablet   Other Relevant Orders   Lipid panel (Completed)   Dyspnea, unspecified type       Relevant Orders   Ambulatory referral to Cardiology   ECHOCARDIOGRAM COMPLETE      I am having Ms. Cantero start on chlorthalidone. I am also having her maintain her multivitamin, Vitamin D, Omega 3-6-9 Fatty Acids (OMEGA 3-6-9 COMPLEX PO), cetirizine, b complex vitamins, montelukast, ipratropium, Zoster Vaccine Live (PF), albuterol, atenolol, losartan, omeprazole, and traMADol.  Meds ordered this encounter  Medications  . chlorthalidone (HYGROTON) 25 MG tablet    Sig: Take 1 tablet (25 mg  total) by mouth daily.    Dispense:  30 tablet    Refill:  3    CMA served as scribe during this visit. History, Physical and Plan performed by medical provider. Documentation and orders reviewed and attested to.  Penni Homans, MD

## 2017-02-27 ENCOUNTER — Other Ambulatory Visit (INDEPENDENT_AMBULATORY_CARE_PROVIDER_SITE_OTHER): Payer: Medicare HMO

## 2017-02-27 DIAGNOSIS — I10 Essential (primary) hypertension: Secondary | ICD-10-CM | POA: Diagnosis not present

## 2017-02-27 DIAGNOSIS — R739 Hyperglycemia, unspecified: Secondary | ICD-10-CM

## 2017-02-27 DIAGNOSIS — E785 Hyperlipidemia, unspecified: Secondary | ICD-10-CM | POA: Diagnosis not present

## 2017-02-27 LAB — VITAMIN D 25 HYDROXY (VIT D DEFICIENCY, FRACTURES): VITD: 20.27 ng/mL — ABNORMAL LOW (ref 30.00–100.00)

## 2017-02-27 LAB — COMPREHENSIVE METABOLIC PANEL
ALBUMIN: 4.1 g/dL (ref 3.5–5.2)
ALK PHOS: 77 U/L (ref 39–117)
ALT: 12 U/L (ref 0–35)
AST: 14 U/L (ref 0–37)
BILIRUBIN TOTAL: 0.5 mg/dL (ref 0.2–1.2)
BUN: 15 mg/dL (ref 6–23)
CO2: 30 mEq/L (ref 19–32)
Calcium: 9.6 mg/dL (ref 8.4–10.5)
Chloride: 105 mEq/L (ref 96–112)
Creatinine, Ser: 0.82 mg/dL (ref 0.40–1.20)
GFR: 88.77 mL/min (ref >60.00)
GLUCOSE: 86 mg/dL (ref 70–99)
POTASSIUM: 3.9 meq/L (ref 3.5–5.1)
Sodium: 141 mEq/L (ref 135–145)
TOTAL PROTEIN: 7.4 g/dL (ref 6.0–8.3)

## 2017-02-27 LAB — TSH: TSH: 1.31 u[IU]/mL (ref 0.35–4.50)

## 2017-02-27 LAB — LIPID PANEL
CHOLESTEROL: 175 mg/dL (ref 0–200)
HDL: 39.5 mg/dL (ref >39.00)
LDL Cholesterol: 124 mg/dL — ABNORMAL HIGH (ref 0–99)
NONHDL: 135.89
Total CHOL/HDL Ratio: 4
Triglycerides: 58 mg/dL (ref 0.0–149.0)
VLDL: 11.6 mg/dL (ref 0.0–40.0)

## 2017-02-27 LAB — CBC
HEMATOCRIT: 40.4 % (ref 36.0–46.0)
Hemoglobin: 13.5 g/dL (ref 12.0–15.0)
MCHC: 33.5 g/dL (ref 30.0–36.0)
MCV: 89.8 fl (ref 78.0–100.0)
PLATELETS: 242 10*3/uL (ref 150.0–400.0)
RBC: 4.5 Mil/uL (ref 3.87–5.11)
RDW: 13.3 % (ref 11.5–15.5)
WBC: 9.4 10*3/uL (ref 4.0–10.5)

## 2017-02-27 LAB — HEMOGLOBIN A1C: Hgb A1c MFr Bld: 5.6 % (ref 4.6–6.5)

## 2017-02-28 ENCOUNTER — Other Ambulatory Visit: Payer: Self-pay | Admitting: Family Medicine

## 2017-02-28 MED ORDER — VITAMIN D (ERGOCALCIFEROL) 1.25 MG (50000 UNIT) PO CAPS: 50000.0000 [IU] | ORAL_CAPSULE | ORAL | 4 refills | Status: DC

## 2017-03-02 NOTE — Assessment & Plan Note (Signed)
Encouraged increased hydration, 64 ounces of clear fluids daily. Minimize alcohol and caffeine. Eat small frequent meals with lean proteins and complex carbs. Avoid high and low blood sugars. Get adequate sleep, 7-8 hours a night. Needs exercise daily preferably in the morning.  

## 2017-03-02 NOTE — Assessment & Plan Note (Signed)
Continue Atenolol and add Chlorthalidone. Check labs and BP in next 3-4 weeks.

## 2017-03-06 ENCOUNTER — Ambulatory Visit (HOSPITAL_BASED_OUTPATIENT_CLINIC_OR_DEPARTMENT_OTHER)
Admission: RE | Admit: 2017-03-06 | Discharge: 2017-03-06 | Disposition: A | Discharge status: Home / Self Care | Payer: Medicare HMO | Source: Ambulatory Visit | Attending: Family Medicine | Admitting: Family Medicine

## 2017-03-06 DIAGNOSIS — R0600 Dyspnea, unspecified: Secondary | ICD-10-CM | POA: Diagnosis not present

## 2017-03-06 LAB — ECHOCARDIOGRAM COMPLETE
E decel time: 296 msec
EERAT: 8.74
FS: 40 % (ref 28–44)
IVS/LV PW RATIO, ED: 0.92
LA ID, A-P, ES: 38 mm
LA vol A4C: 45.1 ml
LA vol index: 24.2 mL/m2
LADIAMINDEX: 1.93 cm/m2
LAVOL: 47.6 mL
LDCA: 2.84 cm2
LEFT ATRIUM END SYS DIAM: 38 mm
LV E/e' medial: 8.74
LV TDI E'MEDIAL: 9.57
LV e' LATERAL: 11.1 cm/s
LVEEAVG: 8.74
LVOT VTI: 25.3 cm
LVOT peak vel: 83.5 cm/s
LVOTD: 19 mm
LVOTSV: 72 mL
MV Dec: 296
MV pk A vel: 94.6 m/s
MV pk E vel: 97 m/s
MVPG: 4 mmHg
PW: 11.8 mm — AB (ref 0.6–1.1)
RV LATERAL S' VELOCITY: 12.6 cm/s
RV TAPSE: 17.3 mm
TDI e' lateral: 11.1

## 2017-03-06 NOTE — Progress Notes (Signed)
  Echocardiogram 2D Echocardiogram has been performed.  Anne Barnes 03/06/2017, 3:03 PM

## 2017-03-08 ENCOUNTER — Encounter: Payer: Self-pay | Admitting: Family

## 2017-03-08 ENCOUNTER — Other Ambulatory Visit: Payer: Self-pay | Admitting: Family Medicine

## 2017-03-08 ENCOUNTER — Ambulatory Visit (INDEPENDENT_AMBULATORY_CARE_PROVIDER_SITE_OTHER): Payer: Medicare HMO | Admitting: Family

## 2017-03-08 VITALS — HR 66

## 2017-03-08 DIAGNOSIS — I10 Essential (primary) hypertension: Secondary | ICD-10-CM | POA: Diagnosis not present

## 2017-03-08 LAB — BASIC METABOLIC PANEL
BUN: 25 mg/dL — AB (ref 6–23)
CHLORIDE: 102 meq/L (ref 96–112)
CO2: 31 meq/L (ref 19–32)
CREATININE: 0.92 mg/dL (ref 0.40–1.20)
Calcium: 9.6 mg/dL (ref 8.4–10.5)
GFR: 77.73 mL/min (ref >60.00)
Glucose, Bld: 103 mg/dL — ABNORMAL HIGH (ref 70–99)
POTASSIUM: 4.1 meq/L (ref 3.5–5.1)
Sodium: 140 mEq/L (ref 135–145)

## 2017-03-08 NOTE — Progress Notes (Signed)
Noted and agree. 

## 2017-03-08 NOTE — Progress Notes (Signed)
Pre visit review using our clinic tool,if applicable. No additional management support is needed unless otherwise documented below in the visit note.   Patient in for BP check per order from Dr. Willette Alma on 02/25/2017  BP last visit= 170/80 P= 54  BP today = 129/63 P = 60  Per M. Edwena Blow DOD patient to continue medications as ordered. BMP today and return for follow up appointment with Dr.Blyth 3 months patient has appointment scheduled with Charlett Blake for July.

## 2017-03-08 NOTE — Progress Notes (Signed)
RN blood pressure check note reviewed. Agree with documention and plan. 

## 2017-03-15 ENCOUNTER — Ambulatory Visit: Payer: Medicare HMO | Admitting: Cardiovascular Disease

## 2017-03-19 NOTE — Progress Notes (Addendum)
.   Cardiology Office Note NEW PATIENT VISIT   Date:  03/20/2017   ID:  Anne Barnes, DOB 1946-07-24, MRN [id number]  PCP:  Mosie Lukes, MD  Cardiologist:  Dr. Meda Coffee    Chief Complaint  Patient presents with  . Fatigue  . Bradycardia      History of Present Illness: Anne Barnes is a 71 y.o. female who is being seen today for the evaluation of bradycardia at the request of Mosie Lukes, MD. I se HR down to 54 and she is on BB.  She has been on atenolol and does believe she has more "tiredness" since adding.  She tells me she always has had slow HR.     PAD Screen 03/20/2017  Previous PAD dx? No  Previous surgical procedure? No  Pain with walking? No  Feet/toe relief with dangling? No  Painful, non-healing ulcers? No  Extremities discolored? No   Pt with hx of HTN, hyperglycemia, mild HLD, osteoarthritis, tinnitus, IBS, OSA intolerant to CPAP and headaches thought to be allergies.  Dr. Annamaria Boots follows her sleep apnea.      Echo done 02/2017 EF was 60-65% no RWMA,    She tells me she does have Lt arm pressure at times, not just with exertion but anytime. No associated symptoms with this.  It last 2-3 hours when it occurs and at times she does have tingling into Lt hand.  She also into her lt neck. She has indigestions as well and GERD but only takes prilosec as needed.   She does not exercise.  Her thyroid and all labs were normal.  Her tiredness seems to bother her the most.  May be due to HR or just to BB alone.        Past Medical History:  Diagnosis Date  . ALLERGIC RHINITIS   . Allergic urticaria 08/01/2014   Patient reports allergies to shrimp, egg whites, tomatoes, dairy   . Bradycardia 02/25/2017  . Chiari malformation    Noted MRI brain 09/2013 - s/p neuro eval for same  . DIVERTICULOSIS, COLON   . Frequent headaches   . GERD   . H/O measles   . H/O mumps   . Hearing loss 01/18/2017  . History of chicken pox   . HYPERTENSION   . Hypocalcemia 02/25/2017  .  IBS (irritable bowel syndrome) 05/26/2015  . OBSTRUCTIVE SLEEP APNEA 12/2008 dx   noncompliant with CPAP qhs  . Sciatica    right side  . 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 Vaginitis 01/18/2017    Past Surgical History:  Procedure Laterality Date  . ABDOMINAL HYSTERECTOMY  1986  . BREAST SURGERY  1973   Breast biopsy  . UMBILICAL HERNIA REPAIR       Current Outpatient Prescriptions  Medication Sig Dispense Refill  . albuterol (PROVENTIL HFA;VENTOLIN HFA) 108 (90 Base) MCG/ACT inhaler Inhale 2 puffs into the lungs every 4 (four) hours as needed for wheezing or shortness of breath.    Marland Kitchen b complex vitamins tablet Take 1 tablet by mouth daily.    . cetirizine (ZYRTEC) 10 MG tablet Take 1 tablet (10 mg total) by mouth daily. 30 tablet 11  . chlorthalidone (HYGROTON) 25 MG tablet Take 1 tablet (25 mg total) by mouth daily. 30 tablet 3  . Cholecalciferol (VITAMIN D) 2000 UNITS CAPS Take by mouth daily.     Marland Kitchen ipratropium (ATROVENT) 0.06 % nasal spray 1-2 puffs each nostril twice daily as needed 15  mL 11  . losartan (COZAAR) 100 MG tablet Take 1 tablet (100 mg total) by mouth daily. 90 tablet 3  . Multiple Vitamin (MULTIVITAMIN) tablet Take 1 tablet by mouth daily.      . Omega 3-6-9 Fatty Acids (OMEGA 3-6-9 COMPLEX PO) Take 1 capsule by mouth daily.      Marland Kitchen omeprazole (PRILOSEC) 20 MG capsule Take 1 capsule (20 mg total) by mouth daily. 90 capsule 3  . Vitamin D, Ergocalciferol, (DRISDOL) 50000 units CAPS capsule Take 1 capsule (50,000 Units total) by mouth every 7 (seven) days. 4 capsule 4  . amLODipine (NORVASC) 5 MG tablet Take 1 tablet (5 mg total) by mouth daily. 90 tablet 3   No current facility-administered medications for this visit.     Allergies:   Codeine; Lisinopril-hydrochlorothiazide; Metoprolol; and Other    Social History:  The patient  reports that she has never smoked. She has never used smokeless tobacco. She reports that she does not drink alcohol or use drugs.    Family History:  The patient's family history includes Alcohol abuse in her brother and other; Arthritis in her father and mother; Cancer in her brother; Diabetes in her brother; Fibromyalgia in her daughter; Berenice Primas' disease in her daughter; Heart attack in her brother; Heart disease in her brother and father; Hypertension in her daughter, daughter, maternal grandfather, and sister; Kidney disease in her father, sister, and sister; Lupus in her daughter; Obesity in her sister; Pneumonia in her brother and father; Rheum arthritis in her daughter; Sjogren's syndrome in her daughter.    ROS:  General:no colds or fevers, no weight changes Skin:no rashes or ulcers HEENT:no blurred vision, no congestion CV:see HPI PUL:see HPI--does have hx of asthma GI:no diarrhea constipation or melena, no indigestion GU:no hematuria, no dysuria IO:[id number] Lt shoulder pain, no claudication Neuro:no syncope, occ lightheadedness, at time Lt hand tremor  (her father with hx of parkinsons  Endo:no diabetes, no thyroid disease  Wt Readings from Last 3 Encounters:  03/20/17 227 lb 6.4 oz (103.1 kg)  02/25/17 231 lb 9.6 oz (105.1 kg)  02/19/17 231 lb (104.8 kg)     PHYSICAL EXAM: VS:  BP 136/60 (BP Location: Left Arm, Patient Position: Sitting, Cuff Size: Large)   Pulse (!) 58   Ht 4' 11.5" (1.511 m)   Wt 227 lb 6.4 oz (103.1 kg)   BMI 45.16 kg/m  , BMI Body mass index is 45.16 kg/m. General:Pleasant affect, NAD Skin:Warm and dry, brisk capillary refill HEENT:normocephalic, sclera clear, mucus membranes moist Neck:supple, no JVD, no bruits  Heart:S1S2 RRR without murmur, gallup, rub or click Lungs:clear without rales, rhonchi, or wheezes FGH:WEXH, non tender, + BS, do not palpate liver spleen or masses Ext:no lower ext edema, 2+ pedal pulses, 2+ radial pulses Neuro:alert and oriented X 3, MAE, follows commands, + facial symmetry  BP in Rt arm on recheck 140/70 and Lt arm 134/80 both with large cuff.    EKG:  EKG is ordered today. The ekg ordered today demonstrates Sinus Brady at 18.  No ST changes.    Recent Labs: 02/27/2017: ALT 12; Hemoglobin 13.5; Platelets 242.0; TSH 1.31 03/08/2017: BUN 25; Creatinine, Ser 0.92; Potassium 4.1; Sodium 140    Lipid Panel    Component Value Date/Time   CHOL 175 02/27/2017 1103   TRIG 58.0 02/27/2017 1103   TRIG 55 12/30/2009   HDL 39.50 02/27/2017 1103   CHOLHDL 4 02/27/2017 1103   VLDL 11.6 02/27/2017 1103   LDLCALC 124 (H)  02/27/2017 1103       Other studies Reviewed: Additional studies/ records that were reviewed today include:  Echo 03/06/17   Study Conclusions  - Left ventricle: The cavity size was normal. Systolic function was   normal. The estimated ejection fraction was in the range of 60%   to 65%. Wall motion was normal; there were no regional wall   motion abnormalities. There was no evidence of elevated   ventricular filling pressure by Doppler parameters  Mild pulmonic regurg.    ASSESSMENT AND PLAN:  1.  Sinus brady with fatigue.  May be due to low HR or BB alone.  TSH was normal.  sleep apnea may be playing role as well.  Discussed with Dr. Meda Coffee and will stop Atenolol and place on amlodipine 5 mg daily though could increase to 10 mg .   We will call results of tests and she will follow up with Dr. Meda Coffee in 2-3 months unless test is abnormal.    2. Lt shoulder and arm and neck aching along with "indigestion at times"  Will do exercise myoview if she tolerates do eval for ischemia and chronotropic incompetence.  If she cannot walk due angina could change to lexiscan.    3. HTN today fairly stable , recently placed on chlorthalidone continue to monitor now on amlodipine and off BB as of today.    4. OSA not wearing cpap which could be part of brady and fatigue.  I have asked her to follow up with Dr. Annamaria Boots- pulmonology.   5. Hx of asthma  6. GERD takes prilosec PRN.     7. HLD  followed by PCP   Current  medicines are reviewed with the patient today.  The patient Has no concerns regarding medicines.  The following changes have been made:  See above Labs/ tests ordered today include:see above  Disposition:   FU:  see above  Signed, Cecilie Kicks, NP  03/20/2017 12:24 PM    [facility] [address] [place], [place], [place] Phone: [phone]; Fax: [phone]

## 2017-03-20 ENCOUNTER — Ambulatory Visit (INDEPENDENT_AMBULATORY_CARE_PROVIDER_SITE_OTHER): Payer: Medicare HMO | Admitting: Cardiology

## 2017-03-20 ENCOUNTER — Encounter: Payer: Self-pay | Admitting: Cardiology

## 2017-03-20 VITALS — BP 136/60 | HR 58 | Ht 59.5 in | Wt 227.4 lb

## 2017-03-20 DIAGNOSIS — Z8709 Personal history of other diseases of the respiratory system: Secondary | ICD-10-CM

## 2017-03-20 DIAGNOSIS — G4733 Obstructive sleep apnea (adult) (pediatric): Secondary | ICD-10-CM

## 2017-03-20 DIAGNOSIS — K219 Gastro-esophageal reflux disease without esophagitis: Secondary | ICD-10-CM

## 2017-03-20 DIAGNOSIS — E7800 Pure hypercholesterolemia, unspecified: Secondary | ICD-10-CM | POA: Diagnosis not present

## 2017-03-20 DIAGNOSIS — I10 Essential (primary) hypertension: Secondary | ICD-10-CM

## 2017-03-20 DIAGNOSIS — R001 Bradycardia, unspecified: Secondary | ICD-10-CM | POA: Diagnosis not present

## 2017-03-20 DIAGNOSIS — M79602 Pain in left arm: Secondary | ICD-10-CM

## 2017-03-20 MED ORDER — AMLODIPINE BESYLATE 5 MG PO TABS: 5.0000 mg | ORAL_TABLET | Freq: Every day | ORAL | 3 refills | Status: DC

## 2017-03-20 NOTE — Patient Instructions (Signed)
Medication Instructions:  1. STOP TENORMIN  2. START AMLODIPINE 5 MG DAILY; RX HAS BEEN SENT IN  Labwork: NONE ORDERED  Testing/Procedures: Your physician has requested that you have en exercise stress myoview. For further information please visit [email or web address]. Please follow instruction sheet, as given.    Follow-Up: DR. Meda Coffee IN THE NEXT 3 MONTHS  Any Other Special Instructions Will Be Listed Below (If Applicable).     If you need a refill on your cardiac medications before your next appointment, please call your pharmacy.

## 2017-03-21 ENCOUNTER — Telehealth (HOSPITAL_COMMUNITY): Payer: Self-pay | Admitting: *Deleted

## 2017-03-21 NOTE — Telephone Encounter (Signed)
Left message on voicemail per DPR in reference to upcoming appointment scheduled on 03/24/17 with detailed instructions given per Myocardial Perfusion Study Information Sheet for the test. LM to arrive 15 minutes early, and that it is imperative to arrive on time for appointment to keep from having the test rescheduled. If you need to cancel or reschedule your appointment, please call the office within 24 hours of your appointment. Failure to do so may result in a cancellation of your appointment, and a $50 no show fee. Phone number given for call back for any questions. Kirstie Peri

## 2017-03-25 ENCOUNTER — Ambulatory Visit (HOSPITAL_COMMUNITY): Payer: Medicare HMO | Attending: Internal Medicine

## 2017-03-25 VITALS — Ht 59.5 in | Wt 227.0 lb

## 2017-03-25 DIAGNOSIS — I510 Cardiac septal defect, acquired: Secondary | ICD-10-CM | POA: Insufficient documentation

## 2017-03-25 DIAGNOSIS — R9439 Abnormal result of other cardiovascular function study: Secondary | ICD-10-CM | POA: Insufficient documentation

## 2017-03-25 DIAGNOSIS — M79602 Pain in left arm: Secondary | ICD-10-CM

## 2017-03-25 DIAGNOSIS — R112 Nausea with vomiting, unspecified: Secondary | ICD-10-CM

## 2017-03-25 MED ORDER — TECHNETIUM TC 99M TETROFOSMIN IV KIT
32.6000 | PACK | Freq: Once | INTRAVENOUS | Status: AC | PRN
  Administered 2017-03-25: 32.6 via INTRAVENOUS
  Filled 2017-03-25: qty 33

## 2017-03-25 MED ORDER — REGADENOSON 0.4 MG/5ML IV SOLN
0.4000 mg | Freq: Once | INTRAVENOUS | Status: AC
  Administered 2017-03-25: 0.4 mg via INTRAVENOUS

## 2017-03-25 MED ORDER — AMINOPHYLLINE 25 MG/ML IV SOLN
150.0000 mg | Freq: Once | INTRAVENOUS | Status: AC
  Administered 2017-03-25: 150 mg via INTRAVENOUS

## 2017-03-26 ENCOUNTER — Ambulatory Visit (HOSPITAL_COMMUNITY): Payer: Medicare HMO | Attending: Cardiovascular Disease

## 2017-03-26 LAB — MYOCARDIAL PERFUSION IMAGING
CHL CUP NUCLEAR SDS: 1
CHL CUP NUCLEAR SSS: 4
CHL CUP RESTING HR STRESS: 79 {beats}/min
CHL RATE OF PERCEIVED EXERTION: 20
CSEPED: 3 min
CSEPEDS: 33 s
CSEPEW: 4.8 METS
LV sys vol: 29 mL
LVDIAVOL: 87 mL (ref 46–106)
MPHR: 151 {beats}/min
Peak HR: 127 {beats}/min
Percent HR: 84 %
RATE: 0.21
SRS: 3
TID: 1.01

## 2017-03-26 MED ORDER — TECHNETIUM TC 99M TETROFOSMIN IV KIT
31.3000 | PACK | Freq: Once | INTRAVENOUS | Status: AC | PRN
  Administered 2017-03-26: 31.3 via INTRAVENOUS
  Filled 2017-03-26: qty 32

## 2017-03-26 NOTE — Progress Notes (Deleted)
Subjective:   Anne Barnes is a 71 y.o. female who presents for an Initial Medicare Annual Wellness Visit.  Review of Systems    No ROS.  Medicare Wellness Visit. Additional risk factors are reflected in the social history.   Sleep patterns:   Home Safety/Smoke Alarms: Feels safe in home. Smoke alarms in place.  Living environment; residence and Firearm Safety:  [place] Safety/Bike Helmet: Wears seat belt.   Counseling:   Eye Exam-  Dental-  Female:   Pap- Hysterectomy.      Mammo- Last 11/22/15: BI-RADS CATEGORY  1: Negative.       Dexa scan- Last 11/22/15: osteoporosis  CCS- Last ABSTRACT entry 10/21/09-WNL w/ Dr.Mann     Objective:    There were no vitals filed for this visit. There is no height or weight on file to calculate BMI.   Current Medications (verified) Outpatient Encounter Prescriptions as of 03/28/2017  Medication Sig  . albuterol (PROVENTIL HFA;VENTOLIN HFA) 108 (90 Base) MCG/ACT inhaler Inhale 2 puffs into the lungs every 4 (four) hours as needed for wheezing or shortness of breath.  Marland Kitchen amLODipine (NORVASC) 5 MG tablet Take 1 tablet (5 mg total) by mouth daily.  Marland Kitchen b complex vitamins tablet Take 1 tablet by mouth daily.  . cetirizine (ZYRTEC) 10 MG tablet Take 1 tablet (10 mg total) by mouth daily.  . chlorthalidone (HYGROTON) 25 MG tablet Take 1 tablet (25 mg total) by mouth daily.  . Cholecalciferol (VITAMIN D) 2000 UNITS CAPS Take by mouth daily.   Marland Kitchen ipratropium (ATROVENT) 0.06 % nasal spray 1-2 puffs each nostril twice daily as needed  . losartan (COZAAR) 100 MG tablet Take 1 tablet (100 mg total) by mouth daily.  . Multiple Vitamin (MULTIVITAMIN) tablet Take 1 tablet by mouth daily.    . Omega 3-6-9 Fatty Acids (OMEGA 3-6-9 COMPLEX PO) Take 1 capsule by mouth daily.    Marland Kitchen omeprazole (PRILOSEC) 20 MG capsule Take 1 capsule (20 mg total) by mouth daily.  . Vitamin D, Ergocalciferol, (DRISDOL) 50000 units CAPS capsule Take 1 capsule (50,000 Units total) by  mouth every 7 (seven) days.   No facility-administered encounter medications on file as of 03/28/2017.     Allergies (verified) Codeine; Lisinopril-hydrochlorothiazide; Metoprolol; and Other   History: Past Medical History:  Diagnosis Date  . ALLERGIC RHINITIS   . Allergic urticaria 08/01/2014   Patient reports allergies to shrimp, egg whites, tomatoes, dairy   . Bradycardia 02/25/2017  . Chiari malformation    Noted MRI brain 09/2013 - s/p neuro eval for same  . DIVERTICULOSIS, COLON   . Frequent headaches   . GERD   . H/O measles   . H/O mumps   . Hearing loss 01/18/2017  . History of chicken pox   . HYPERTENSION   . Hypocalcemia 02/25/2017  . IBS (irritable bowel syndrome) 05/26/2015  . OBSTRUCTIVE SLEEP APNEA 12/2008 dx   noncompliant with CPAP qhs  . Sciatica    right side  . 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 Vaginitis 01/18/2017   Past Surgical History:  Procedure Laterality Date  . ABDOMINAL HYSTERECTOMY  1986  . BREAST SURGERY  1973   Breast biopsy  . UMBILICAL HERNIA REPAIR     Family History  Problem Relation Age of Onset  . Arthritis Mother        died of complications from hip surgery  . Arthritis Father   . Kidney disease Father   . Pneumonia Father   .  Heart disease Father   . Lupus Daughter   . Rheum arthritis Daughter   . Sjogren's syndrome Daughter   . Fibromyalgia Daughter   . Diabetes Brother   . Heart disease Brother   . Heart attack Brother   . Cancer Brother        throat and lung  . Alcohol abuse Brother   . Kidney disease Sister        dialysis  . Obesity Sister   . Hypertension Maternal Grandfather   . Hypertension Sister   . Kidney disease Sister        dialysis  . Pneumonia Brother   . Hypertension Daughter   . Graves' disease Daughter   . Hypertension Daughter   . Alcohol abuse Other        parent  . Ataxia Neg Hx   . Chorea Neg Hx   . Dementia Neg Hx   . Mental retardation Neg Hx   . Migraines Neg Hx   . Multiple sclerosis  Neg Hx   . Neurofibromatosis Neg Hx   . Neuropathy Neg Hx   . Parkinsonism Neg Hx   . Seizures Neg Hx   . Stroke Neg Hx    Social History   Occupational History  . retired    Social History Main Topics  . Smoking status: Never Smoker  . Smokeless tobacco: Never Used     Comment: Married, lives with spouse, dtr 2 g-kids.  . Alcohol use No     Comment: rare  . Drug use: No  . Sexual activity: Not on file     Comment: lives with husband, no dietary restrictions avoid foods allergic to, retired    Tobacco Counseling Counseling given: Not Answered   Activities of Daily Living No flowsheet data found.  Immunizations and Health Maintenance Immunization History  Administered Date(s) Administered  . Influenza, High Dose Seasonal PF 07/15/2013  . Influenza,inj,Quad PF,36+ Mos 08/29/2015  . Pneumococcal Conjugate-13 11/30/2015  . Pneumococcal Polysaccharide-23 02/26/2013  . Td 09/25/2003  . Tdap 08/29/2015   There are no preventive care reminders to display for this patient.  Patient Care Team: Mosie Lukes, MD as PCP - General (Family Medicine) Juanita Craver, MD (Gastroenterology) Deneise Lever, MD as Consulting Physician (Pulmonary Disease)  Indicate any recent Medical Services you may have received from other than [facility] providers in the past year (date may be approximate).     Assessment:   This is a routine wellness examination for Anne Barnes. No ROS.  Medicare Wellness Visit. Additional risk factors are reflected in the social history.   Hearing/Vision screen No exam data present  Dietary issues and exercise activities discussed:   Diet (meal preparation, eat out, water intake, caffeinated beverages, dairy products, fruits and vegetables): {Desc; diets:16563} Breakfast: Lunch:  Dinner:      Goals    None     Depression Screen PHQ 2/9 Scores 06/07/2016 05/26/2015 02/26/2013  PHQ - 2 Score 0 0 4  PHQ- 9 Score - - 8    Fall Risk Fall Risk  06/07/2016  05/26/2015 02/26/2013  Falls in the past year? No No No    Cognitive Function:        Screening Tests Health Maintenance  Topic Date Due  . INFLUENZA VACCINE  04/24/2017  . MAMMOGRAM  11/21/2017  . COLONOSCOPY  10/22/2019  . TETANUS/TDAP  08/28/2025  . DEXA SCAN  Completed  . Hepatitis C Screening  Completed  . PNA vac Low Risk Adult  Completed      Plan:   ***  I have personally reviewed and noted the following in the patient's chart:   . Medical and social history . Use of alcohol, tobacco or illicit drugs  . Current medications and supplements . Functional ability and status . Nutritional status . Physical activity . Advanced directives . List of other physicians . Hospitalizations, surgeries, and ER visits in previous 12 months . Vitals . Screenings to include cognitive, depression, and falls . Referrals and appointments  In addition, I have reviewed and discussed with patient certain preventive protocols, quality metrics, and best practice recommendations. A written personalized care plan for preventive services as well as general preventive health recommendations were provided to patient.     Shela Nevin, [place]   03/26/2017

## 2017-03-28 ENCOUNTER — Encounter: Payer: Self-pay | Admitting: Family Medicine

## 2017-03-28 ENCOUNTER — Ambulatory Visit (INDEPENDENT_AMBULATORY_CARE_PROVIDER_SITE_OTHER): Payer: Medicare HMO | Admitting: Family Medicine

## 2017-03-28 DIAGNOSIS — I10 Essential (primary) hypertension: Secondary | ICD-10-CM | POA: Diagnosis not present

## 2017-03-28 DIAGNOSIS — R739 Hyperglycemia, unspecified: Secondary | ICD-10-CM | POA: Diagnosis not present

## 2017-03-28 DIAGNOSIS — E785 Hyperlipidemia, unspecified: Secondary | ICD-10-CM | POA: Diagnosis not present

## 2017-03-28 DIAGNOSIS — R001 Bradycardia, unspecified: Secondary | ICD-10-CM | POA: Diagnosis not present

## 2017-03-28 NOTE — Patient Instructions (Signed)

## 2017-03-28 NOTE — Assessment & Plan Note (Addendum)
Improved with change in meds. Struggled with weakness and hypotension with stress test but is feeling better today. Reviewed results with patient which were reassuring

## 2017-03-28 NOTE — Assessment & Plan Note (Signed)
hgba1c acceptable, minimize simple carbs. Increase exercise as tolerated.  

## 2017-03-28 NOTE — Assessment & Plan Note (Signed)
Well controlled, no changes to meds. Encouraged heart healthy diet such as the DASH diet and exercise as tolerated.  °

## 2017-03-28 NOTE — Assessment & Plan Note (Signed)
Encouraged heart healthy diet, increase exercise, avoid trans fats, consider a krill oil cap daily 

## 2017-03-28 NOTE — Progress Notes (Signed)
Patient ID: Anne Barnes, female   DOB: Oct 21, 1945, 71 y.o.   MRN: [id number]    Subjective:  I acted as a Education administrator for Dr. Carollee Herter.  Guerry Bruin, [place]   Patient ID: Anne Barnes, female    DOB: Aug 14, 1946, 71 y.o.   MRN: [id number]  Chief Complaint  Patient presents with  . Hypertension    HPI  Patient is in today for follow up blood pressure.  She was seen by the cardiologist office Cecilie Kicks, NP and they took her off of the atenolol because it made her heart rate to low.  They replaced it with amlodipine.  Has been checking at home, which has been doing good since starting the third medication.  Lowest reading 116/61 and highest 138/68. She reports she is feeling better today but has felt significant fatigue since her stress test. Denies CP/palp/SOB/HA/congestion/fevers/GI or GU c/o. Taking meds as prescribed  Patient Care Team: Mosie Lukes, MD as PCP - General (Family Medicine) Juanita Craver, MD (Gastroenterology) Deneise Lever, MD as Consulting Physician (Pulmonary Disease)   Past Medical History:  Diagnosis Date  . ALLERGIC RHINITIS   . Allergic urticaria 08/01/2014   Patient reports allergies to shrimp, egg whites, tomatoes, dairy   . Bradycardia 02/25/2017  . Chiari malformation    Noted MRI brain 09/2013 - s/p neuro eval for same  . DIVERTICULOSIS, COLON   . Frequent headaches   . GERD   . H/O measles   . H/O mumps   . Hearing loss 01/18/2017  . History of chicken pox   . HYPERTENSION   . Hypocalcemia 02/25/2017  . IBS (irritable bowel syndrome) 05/26/2015  . OBSTRUCTIVE SLEEP APNEA 12/2008 dx   noncompliant with CPAP qhs  . Sciatica    right side  . 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 Vaginitis 01/18/2017    Past Surgical History:  Procedure Laterality Date  . ABDOMINAL HYSTERECTOMY  1986  . BREAST SURGERY  1973   Breast biopsy  . UMBILICAL HERNIA REPAIR      Family History  Problem Relation Age of Onset  . Arthritis Mother        died of  complications from hip surgery  . Arthritis Father   . Kidney disease Father   . Pneumonia Father   . Heart disease Father   . Lupus Daughter   . Rheum arthritis Daughter   . Sjogren's syndrome Daughter   . Fibromyalgia Daughter   . Diabetes Brother   . Heart disease Brother   . Heart attack Brother   . Cancer Brother        throat and lung  . Alcohol abuse Brother   . Kidney disease Sister        dialysis  . Obesity Sister   . Hypertension Maternal Grandfather   . Hypertension Sister   . Kidney disease Sister        dialysis  . Pneumonia Brother   . Hypertension Daughter   . Graves' disease Daughter   . Hypertension Daughter   . Alcohol abuse Other        parent  . Ataxia Neg Hx   . Chorea Neg Hx   . Dementia Neg Hx   . Mental retardation Neg Hx   . Migraines Neg Hx   . Multiple sclerosis Neg Hx   . Neurofibromatosis Neg Hx   . Neuropathy Neg Hx   . Parkinsonism Neg Hx   . Seizures Neg Hx   .  Stroke Neg Hx     Social History   Social History  . Marital status: Married    Spouse name: N/A  . Number of children: N/A  . Years of education: N/A   Occupational History  . retired    Social History Main Topics  . Smoking status: Never Smoker  . Smokeless tobacco: Never Used     Comment: Married, lives with spouse, dtr 2 g-kids.  . Alcohol use No     Comment: rare  . Drug use: No  . Sexual activity: Not on file     Comment: lives with husband, no dietary restrictions avoid foods allergic to, retired   Other Topics Concern  . Not on file   Social History Narrative  . No narrative on file    Outpatient Medications Prior to Visit  Medication Sig Dispense Refill  . albuterol (PROVENTIL HFA;VENTOLIN HFA) 108 (90 Base) MCG/ACT inhaler Inhale 2 puffs into the lungs every 4 (four) hours as needed for wheezing or shortness of breath.    Marland Kitchen amLODipine (NORVASC) 5 MG tablet Take 1 tablet (5 mg total) by mouth daily. 90 tablet 3  . b complex vitamins tablet Take 1  tablet by mouth daily.    . cetirizine (ZYRTEC) 10 MG tablet Take 1 tablet (10 mg total) by mouth daily. 30 tablet 11  . chlorthalidone (HYGROTON) 25 MG tablet Take 1 tablet (25 mg total) by mouth daily. 30 tablet 3  . Cholecalciferol (VITAMIN D) 2000 UNITS CAPS Take by mouth daily.     Marland Kitchen ipratropium (ATROVENT) 0.06 % nasal spray 1-2 puffs each nostril twice daily as needed 15 mL 11  . losartan (COZAAR) 100 MG tablet Take 1 tablet (100 mg total) by mouth daily. 90 tablet 3  . Multiple Vitamin (MULTIVITAMIN) tablet Take 1 tablet by mouth daily.      . Omega 3-6-9 Fatty Acids (OMEGA 3-6-9 COMPLEX PO) Take 1 capsule by mouth daily.      Marland Kitchen omeprazole (PRILOSEC) 20 MG capsule Take 1 capsule (20 mg total) by mouth daily. 90 capsule 3  . Vitamin D, Ergocalciferol, (DRISDOL) 50000 units CAPS capsule Take 1 capsule (50,000 Units total) by mouth every 7 (seven) days. 4 capsule 4   No facility-administered medications prior to visit.     Allergies  Allergen Reactions  . Codeine Nausea And Vomiting  . Lisinopril-Hydrochlorothiazide Hives    REACTION: lip swelling,facial swelling,rash.  . Metoprolol Other (See Comments)    Does not feel well on it   . Other     Patient states she is allergic to Shrimp, egg whites, tomatoes and dairy products.  Says they make her feel unwell but do not cause hives, SOB or anaphylaxis.  She just avoids eating them because they can make her have a headache.    Review of Systems  Constitutional: Positive for malaise/fatigue. Negative for fever.  HENT: Negative for congestion.   Eyes: Negative for blurred vision.  Respiratory: Negative for cough and shortness of breath.   Cardiovascular: Negative for chest pain, palpitations and leg swelling.  Gastrointestinal: Negative for vomiting.  Musculoskeletal: Negative for back pain.  Skin: Negative for rash.  Neurological: Negative for loss of consciousness and headaches.       Objective:    Physical Exam    Constitutional: She is oriented to person, place, and time. She appears well-developed and well-nourished. No distress.  HENT:  Head: Normocephalic and atraumatic.  Nose: Nose normal.  Eyes: Conjunctivae are normal. Right  eye exhibits no discharge. Left eye exhibits no discharge.  Neck: Normal range of motion. Neck supple. No thyromegaly present.  Cardiovascular: Normal rate and regular rhythm.   No murmur heard. Pulmonary/Chest: Effort normal and breath sounds normal. She has no wheezes.  Abdominal: Soft. Bowel sounds are normal. There is no tenderness.  Musculoskeletal: Normal range of motion. She exhibits no edema or deformity.  Neurological: She is alert and oriented to person, place, and time.  Skin: Skin is warm and dry. She is not diaphoretic.  Psychiatric: She has a normal mood and affect.  Nursing note and vitals reviewed.   BP 126/70 (BP Location: Left Arm, Cuff Size: Large)   Pulse 70   Temp 98.6 F (37 C) (Oral)   Resp 16   Ht 5' (1.524 m)   Wt 228 lb 3.2 oz (103.5 kg)   SpO2 96%   BMI 44.57 kg/m  Wt Readings from Last 3 Encounters:  03/28/17 228 lb 3.2 oz (103.5 kg)  03/25/17 227 lb (103 kg)  03/20/17 227 lb 6.4 oz (103.1 kg)   BP Readings from Last 3 Encounters:  03/28/17 126/70  03/20/17 136/60  02/25/17 (!) 170/80     Immunization History  Administered Date(s) Administered  . Influenza, High Dose Seasonal PF 07/15/2013  . Influenza,inj,Quad PF,36+ Mos 08/29/2015  . Pneumococcal Conjugate-13 11/30/2015  . Pneumococcal Polysaccharide-23 02/26/2013  . Td 09/25/2003  . Tdap 08/29/2015    Health Maintenance  Topic Date Due  . INFLUENZA VACCINE  04/24/2017  . MAMMOGRAM  11/21/2017  . COLONOSCOPY  10/22/2019  . TETANUS/TDAP  08/28/2025  . DEXA SCAN  Completed  . Hepatitis C Screening  Completed  . PNA vac Low Risk Adult  Completed    Lab Results  Component Value Date   WBC 9.4 02/27/2017   HGB 13.5 02/27/2017   HCT 40.4 02/27/2017   PLT 242.0  02/27/2017   GLUCOSE 103 (H) 03/08/2017   CHOL 175 02/27/2017   TRIG 58.0 02/27/2017   HDL 39.50 02/27/2017   LDLCALC 124 (H) 02/27/2017   ALT 12 02/27/2017   AST 14 02/27/2017   NA 140 03/08/2017   K 4.1 03/08/2017   CL 102 03/08/2017   CREATININE 0.92 03/08/2017   BUN 25 (H) 03/08/2017   CO2 31 03/08/2017   TSH 1.31 02/27/2017   INR 1.06 01/10/2015   HGBA1C 5.6 02/27/2017    Lab Results  Component Value Date   TSH 1.31 02/27/2017   Lab Results  Component Value Date   WBC 9.4 02/27/2017   HGB 13.5 02/27/2017   HCT 40.4 02/27/2017   MCV 89.8 02/27/2017   PLT 242.0 02/27/2017   Lab Results  Component Value Date   NA 140 03/08/2017   K 4.1 03/08/2017   CO2 31 03/08/2017   GLUCOSE 103 (H) 03/08/2017   BUN 25 (H) 03/08/2017   CREATININE 0.92 03/08/2017   BILITOT 0.5 02/27/2017   ALKPHOS 77 02/27/2017   AST 14 02/27/2017   ALT 12 02/27/2017   PROT 7.4 02/27/2017   ALBUMIN 4.1 02/27/2017   CALCIUM 9.6 03/08/2017   ANIONGAP 7 02/19/2017   GFR 77.73 03/08/2017   Lab Results  Component Value Date   CHOL 175 02/27/2017   Lab Results  Component Value Date   HDL 39.50 02/27/2017   Lab Results  Component Value Date   LDLCALC 124 (H) 02/27/2017   Lab Results  Component Value Date   TRIG 58.0 02/27/2017   Lab Results  Component  Value Date   CHOLHDL 4 02/27/2017   Lab Results  Component Value Date   HGBA1C 5.6 02/27/2017         Assessment & Plan:   Problem List Items Addressed This Visit    Essential hypertension    Well controlled, no changes to meds. Encouraged heart healthy diet such as the DASH diet and exercise as tolerated.       Hyperlipidemia, mild    Encouraged heart healthy diet, increase exercise, avoid trans fats, consider a krill oil cap daily      Hyperglycemia    hgba1c acceptable, minimize simple carbs. Increase exercise as tolerated.       Bradycardia    Improved with change in meds         I am having Anne Barnes  maintain her multivitamin, Vitamin D, Omega 3-6-9 Fatty Acids (OMEGA 3-6-9 COMPLEX PO), cetirizine, b complex vitamins, ipratropium, losartan, omeprazole, chlorthalidone, Vitamin D (Ergocalciferol), albuterol, and amLODipine.  No orders of the defined types were placed in this encounter.   CMA served as Education administrator during this visit. History, Physical and Plan performed by medical provider. Documentation and orders reviewed and attested to.  Penni Homans, MD

## 2017-06-06 ENCOUNTER — Ambulatory Visit (INDEPENDENT_AMBULATORY_CARE_PROVIDER_SITE_OTHER): Payer: Medicare HMO | Admitting: Family Medicine

## 2017-06-06 ENCOUNTER — Encounter: Payer: Self-pay | Admitting: Family Medicine

## 2017-06-06 DIAGNOSIS — R739 Hyperglycemia, unspecified: Secondary | ICD-10-CM

## 2017-06-06 DIAGNOSIS — J329 Chronic sinusitis, unspecified: Secondary | ICD-10-CM | POA: Diagnosis not present

## 2017-06-06 DIAGNOSIS — I10 Essential (primary) hypertension: Secondary | ICD-10-CM

## 2017-06-06 DIAGNOSIS — G4733 Obstructive sleep apnea (adult) (pediatric): Secondary | ICD-10-CM | POA: Diagnosis not present

## 2017-06-06 DIAGNOSIS — E785 Hyperlipidemia, unspecified: Secondary | ICD-10-CM

## 2017-06-06 DIAGNOSIS — Z91018 Allergy to other foods: Secondary | ICD-10-CM | POA: Diagnosis not present

## 2017-06-06 HISTORY — DX: Chronic sinusitis, unspecified: J32.9

## 2017-06-06 LAB — COMPREHENSIVE METABOLIC PANEL
ALBUMIN: 4 g/dL (ref 3.5–5.2)
ALK PHOS: 83 U/L (ref 39–117)
ALT: 17 U/L (ref 0–35)
AST: 17 U/L (ref 0–37)
BILIRUBIN TOTAL: 0.5 mg/dL (ref 0.2–1.2)
BUN: 13 mg/dL (ref 6–23)
CO2: 32 mEq/L (ref 19–32)
Calcium: 9.6 mg/dL (ref 8.4–10.5)
Chloride: 103 mEq/L (ref 96–112)
Creatinine, Ser: 0.93 mg/dL (ref 0.40–1.20)
GFR: 76.71 mL/min (ref >60.00)
GLUCOSE: 103 mg/dL — AB (ref 70–99)
Potassium: 4.5 mEq/L (ref 3.5–5.1)
Sodium: 142 mEq/L (ref 135–145)
TOTAL PROTEIN: 7.7 g/dL (ref 6.0–8.3)

## 2017-06-06 LAB — LIPID PANEL
CHOLESTEROL: 169 mg/dL (ref 0–200)
HDL: 38 mg/dL — AB (ref >39.00)
LDL CALC: 119 mg/dL — AB (ref 0–99)
NonHDL: 131.38
Total CHOL/HDL Ratio: 4
Triglycerides: 60 mg/dL (ref 0.0–149.0)
VLDL: 12 mg/dL (ref 0.0–40.0)

## 2017-06-06 LAB — CBC
HCT: 38.8 % (ref 36.0–46.0)
HEMOGLOBIN: 12.8 g/dL (ref 12.0–15.0)
MCHC: 32.9 g/dL (ref 30.0–36.0)
MCV: 91.5 fl (ref 78.0–100.0)
PLATELETS: 332 10*3/uL (ref 150.0–400.0)
RBC: 4.24 Mil/uL (ref 3.87–5.11)
RDW: 13.4 % (ref 11.5–15.5)
WBC: 10.6 10*3/uL — AB (ref 4.0–10.5)

## 2017-06-06 LAB — TSH: TSH: 1.68 u[IU]/mL (ref 0.35–4.50)

## 2017-06-06 LAB — SEDIMENTATION RATE: Sed Rate: 26 mm/hr (ref 0–30)

## 2017-06-06 LAB — HEMOGLOBIN A1C: HEMOGLOBIN A1C: 5.2 % (ref 4.6–6.5)

## 2017-06-06 MED ORDER — AMOXICILLIN 500 MG PO CAPS
500.0000 mg | ORAL_CAPSULE | Freq: Three times a day (TID) | ORAL | 0 refills | Status: DC
Start: 2017-06-06 — End: 2017-06-11

## 2017-06-06 MED ORDER — CHLORTHALIDONE 25 MG PO TABS: 25.0000 mg | ORAL_TABLET | Freq: Every day | ORAL | 1 refills | Status: DC

## 2017-06-06 MED ORDER — LOSARTAN POTASSIUM 100 MG PO TABS: 100.0000 mg | ORAL_TABLET | Freq: Every day | ORAL | 1 refills | Status: DC

## 2017-06-06 NOTE — Assessment & Plan Note (Signed)
hgba1c acceptable, minimize simple carbs. Increase exercise as tolerated.  

## 2017-06-06 NOTE — Assessment & Plan Note (Signed)
Encouraged heart healthy diet, increase exercise, avoid trans fats, consider a krill oil cap daily 

## 2017-06-06 NOTE — Patient Instructions (Addendum)
Encouraged increased hydration and fiber in diet. Daily probiotics. If bowels not moving can use MOM 2 tbls po in 4 oz of warm prune juice by mouth every 2-3 days. If no results then repeat in 4 hours with  Dulcolax suppository pr, may repeat again in 4 more hours as needed. Seek care if symptoms worsen. Consider daily Miralax and/or Dulcolax if symptoms persist.   Miralax and Benefiber together once or twice daily 64 oz of clear fluids daily  Encouraged increased rest and hydration, add probiotics, zinc such as Coldeze or Xicam. Treat fevers as needed. Plain Mucinex twice daily. Elderberry liquid, Vitamin C 500 to 1000 mg Hypertension Hypertension, commonly called high blood pressure, is when the force of blood pumping through the arteries is too strong. The arteries are the blood vessels that carry blood from the heart throughout the body. Hypertension forces the heart to work harder to pump blood and may cause arteries to become narrow or stiff. Having untreated or uncontrolled hypertension can cause heart attacks, strokes, kidney disease, and other problems. A blood pressure reading consists of a higher number over a lower number. Ideally, your blood pressure should be below 120/80. The first ("top") number is called the systolic pressure. It is a measure of the pressure in your arteries as your heart beats. The second ("bottom") number is called the diastolic pressure. It is a measure of the pressure in your arteries as the heart relaxes. What are the causes? The cause of this condition is not known. What increases the risk? Some risk factors for high blood pressure are under your control. Others are not. Factors you can change  Smoking.  Having type 2 diabetes mellitus, high cholesterol, or both.  Not getting enough exercise or physical activity.  Being overweight.  Having too much fat, sugar, calories, or salt (sodium) in your diet.  Drinking too much alcohol. Factors that are  difficult or impossible to change  Having chronic kidney disease.  Having a family history of high blood pressure.  Age. Risk increases with age.  Race. You may be at higher risk if you are African-American.  Gender. Men are at higher risk than women before age 2. After age 30, women are at higher risk than men.  Having obstructive sleep apnea.  Stress. What are the signs or symptoms? Extremely high blood pressure (hypertensive crisis) may cause:  Headache.  Anxiety.  Shortness of breath.  Nosebleed.  Nausea and vomiting.  Severe chest pain.  Jerky movements you cannot control (seizures).  How is this diagnosed? This condition is diagnosed by measuring your blood pressure while you are seated, with your arm resting on a surface. The cuff of the blood pressure monitor will be placed directly against the skin of your upper arm at the level of your heart. It should be measured at least twice using the same arm. Certain conditions can cause a difference in blood pressure between your right and left arms. Certain factors can cause blood pressure readings to be lower or higher than normal (elevated) for a short period of time:  When your blood pressure is higher when you are in a health care provider's office than when you are at home, this is called white coat hypertension. Most people with this condition do not need medicines.  When your blood pressure is higher at home than when you are in a health care provider's office, this is called masked hypertension. Most people with this condition may need medicines to control blood  pressure.  If you have a high blood pressure reading during one visit or you have normal blood pressure with other risk factors:  You may be asked to return on a different day to have your blood pressure checked again.  You may be asked to monitor your blood pressure at home for 1 week or longer.  If you are diagnosed with hypertension, you may have  other blood or imaging tests to help your health care provider understand your overall risk for other conditions. How is this treated? This condition is treated by making healthy lifestyle changes, such as eating healthy foods, exercising more, and reducing your alcohol intake. Your health care provider may prescribe medicine if lifestyle changes are not enough to get your blood pressure under control, and if:  Your systolic blood pressure is above 130.  Your diastolic blood pressure is above 80.  Your personal target blood pressure may vary depending on your medical conditions, your age, and other factors. Follow these instructions at home: Eating and drinking  Eat a diet that is high in fiber and potassium, and low in sodium, added sugar, and fat. An example eating plan is called the DASH (Dietary Approaches to Stop Hypertension) diet. To eat this way: ? Eat plenty of fresh fruits and vegetables. Try to fill half of your plate at each meal with fruits and vegetables. ? Eat whole grains, such as whole wheat pasta, brown rice, or whole grain bread. Fill about one quarter of your plate with whole grains. ? Eat or drink low-fat dairy products, such as skim milk or low-fat yogurt. ? Avoid fatty cuts of meat, processed or cured meats, and poultry with skin. Fill about one quarter of your plate with lean proteins, such as fish, chicken without skin, beans, eggs, and tofu. ? Avoid premade and processed foods. These tend to be higher in sodium, added sugar, and fat.  Reduce your daily sodium intake. Most people with hypertension should eat less than 1,500 mg of sodium a day.  Limit alcohol intake to no more than 1 drink a day for nonpregnant women and 2 drinks a day for men. One drink equals 12 oz of beer, 5 oz of wine, or 1 oz of hard liquor. Lifestyle  Work with your health care provider to maintain a healthy body weight or to lose weight. Ask what an ideal weight is for you.  Get at least 30  minutes of exercise that causes your heart to beat faster (aerobic exercise) most days of the week. Activities may include walking, swimming, or biking.  Include exercise to strengthen your muscles (resistance exercise), such as pilates or lifting weights, as part of your weekly exercise routine. Try to do these types of exercises for 30 minutes at least 3 days a week.  Do not use any products that contain nicotine or tobacco, such as cigarettes and e-cigarettes. If you need help quitting, ask your health care provider.  Monitor your blood pressure at home as told by your health care provider.  Keep all follow-up visits as told by your health care provider. This is important. Medicines  Take over-the-counter and prescription medicines only as told by your health care provider. Follow directions carefully. Blood pressure medicines must be taken as prescribed.  Do not skip doses of blood pressure medicine. Doing this puts you at risk for problems and can make the medicine less effective.  Ask your health care provider about side effects or reactions to medicines that you should watch  for. Contact a health care provider if:  You think you are having a reaction to a medicine you are taking.  You have headaches that keep coming back (recurring).  You feel dizzy.  You have swelling in your ankles.  You have trouble with your vision. Get help right away if:  You develop a severe headache or confusion.  You have unusual weakness or numbness.  You feel faint.  You have severe pain in your chest or abdomen.  You vomit repeatedly.  You have trouble breathing. Summary  Hypertension is when the force of blood pumping through your arteries is too strong. If this condition is not controlled, it may put you at risk for serious complications.  Your personal target blood pressure may vary depending on your medical conditions, your age, and other factors. For most people, a normal blood  pressure is less than 120/80.  Hypertension is treated with lifestyle changes, medicines, or a combination of both. Lifestyle changes include weight loss, eating a healthy, low-sodium diet, exercising more, and limiting alcohol. This information is not intended to replace advice given to you by your health care provider. Make sure you discuss any questions you have with your health care provider. Document Released: 09/10/2005 Document Revised: 08/08/2016 Document Reviewed: 08/08/2016 Elsevier Interactive Patient Education  [employer].

## 2017-06-06 NOTE — Assessment & Plan Note (Addendum)
Using CPAP nightly struggles with extreme dryness at night recently, referred to pulmonology for further consideration

## 2017-06-06 NOTE — Assessment & Plan Note (Signed)
Encouraged increased rest and hydration, add probiotics, zinc such as Coldeze or Xicam. Treat fevers as needed. Amoxicillin 500 mg po tid

## 2017-06-06 NOTE — Assessment & Plan Note (Signed)
Recent worsening of congestion and allergies referred back to Dr Annamaria Boots for allergy and asthma follow up

## 2017-06-06 NOTE — Assessment & Plan Note (Signed)
Poorly controlled will alter medications, encouraged DASH diet, minimize caffeine and obtain adequate sleep. Report concerning symptoms and follow up as directed and as needed 

## 2017-06-09 NOTE — Progress Notes (Signed)
Patient ID: Anne Barnes, female   DOB: 04/20/1946, 71 y.o.   MRN: [id number]   Subjective:    Patient ID: Anne Barnes, female    DOB: October 12, 1945, 71 y.o.   MRN: [id number]  Chief Complaint  Patient presents with  . Allergies  . Nasal Congestion    States there's an excessive amount of mucus that she's been coughing up and states she's having a lot of frontal pressure.     HPI Patient is in today for evaluation of increasing nasal congestion. She notes a headache and facial pressure and a cough at times. She also notes some myalgias or arthralgias and is concerned that her paternal cousin and a maternal cousin both have lupus and another paternal cousin has vitiligo. Denies CP/palp/SOB/HA/congestion/fevers/GI or GU c/o. Taking meds as prescribed  Past Medical History:  Diagnosis Date  . ALLERGIC RHINITIS   . Allergic urticaria 08/01/2014   Patient reports allergies to shrimp, egg whites, tomatoes, dairy   . Bradycardia 02/25/2017  . Chiari malformation    Noted MRI brain 09/2013 - s/p neuro eval for same  . DIVERTICULOSIS, COLON   . Frequent headaches   . GERD   . H/O measles   . H/O mumps   . Hearing loss 01/18/2017  . History of chicken pox   . HYPERTENSION   . Hypocalcemia 02/25/2017  . IBS (irritable bowel syndrome) 05/26/2015  . OBSTRUCTIVE SLEEP APNEA 12/2008 dx   noncompliant with CPAP qhs  . Sciatica    right side  . Sinusitis 06/06/2017  . 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 Vaginitis 01/18/2017    Past Surgical History:  Procedure Laterality Date  . ABDOMINAL HYSTERECTOMY  1986  . BREAST SURGERY  1973   Breast biopsy  . UMBILICAL HERNIA REPAIR      Family History  Problem Relation Age of Onset  . Arthritis Mother        died of complications from hip surgery  . Arthritis Father   . Kidney disease Father   . Pneumonia Father   . Heart disease Father   . Lupus Daughter   . Rheum arthritis Daughter   . Sjogren's syndrome Daughter   . Fibromyalgia Daughter     . Diabetes Brother   . Heart disease Brother   . Heart attack Brother   . Cancer Brother        throat and lung  . Alcohol abuse Brother   . Kidney disease Sister        dialysis  . Obesity Sister   . Hypertension Maternal Grandfather   . Hypertension Sister   . Kidney disease Sister        dialysis  . Pneumonia Brother   . Hypertension Daughter   . Graves' disease Daughter   . Hypertension Daughter   . Alcohol abuse Other        parent  . Ataxia Neg Hx   . Chorea Neg Hx   . Dementia Neg Hx   . Mental retardation Neg Hx   . Migraines Neg Hx   . Multiple sclerosis Neg Hx   . Neurofibromatosis Neg Hx   . Neuropathy Neg Hx   . Parkinsonism Neg Hx   . Seizures Neg Hx   . Stroke Neg Hx     Social History   Social History  . Marital status: Married    Spouse name: N/A  . Number of children: N/A  . Years of education: N/A   Occupational  History  . retired    Social History Main Topics  . Smoking status: Never Smoker  . Smokeless tobacco: Never Used     Comment: Married, lives with spouse, dtr 2 g-kids.  . Alcohol use No     Comment: rare  . Drug use: No  . Sexual activity: Not on file     Comment: lives with husband, no dietary restrictions avoid foods allergic to, retired   Other Topics Concern  . Not on file   Social History Narrative  . No narrative on file    Outpatient Medications Prior to Visit  Medication Sig Dispense Refill  . albuterol (PROVENTIL HFA;VENTOLIN HFA) 108 (90 Base) MCG/ACT inhaler Inhale 2 puffs into the lungs every 4 (four) hours as needed for wheezing or shortness of breath.    Marland Kitchen amLODipine (NORVASC) 5 MG tablet Take 1 tablet (5 mg total) by mouth daily. 90 tablet 3  . b complex vitamins tablet Take 1 tablet by mouth daily.    . cetirizine (ZYRTEC) 10 MG tablet Take 1 tablet (10 mg total) by mouth daily. 30 tablet 11  . Cholecalciferol (VITAMIN D) 2000 UNITS CAPS Take by mouth daily.     Marland Kitchen ipratropium (ATROVENT) 0.06 % nasal spray  1-2 puffs each nostril twice daily as needed 15 mL 11  . Multiple Vitamin (MULTIVITAMIN) tablet Take 1 tablet by mouth daily.      . Omega 3-6-9 Fatty Acids (OMEGA 3-6-9 COMPLEX PO) Take 1 capsule by mouth daily.      Marland Kitchen omeprazole (PRILOSEC) 20 MG capsule Take 1 capsule (20 mg total) by mouth daily. 90 capsule 3  . Vitamin D, Ergocalciferol, (DRISDOL) 50000 units CAPS capsule Take 1 capsule (50,000 Units total) by mouth every 7 (seven) days. 4 capsule 4  . chlorthalidone (HYGROTON) 25 MG tablet Take 1 tablet (25 mg total) by mouth daily. 30 tablet 3  . losartan (COZAAR) 100 MG tablet Take 1 tablet (100 mg total) by mouth daily. 90 tablet 3   No facility-administered medications prior to visit.     Allergies  Allergen Reactions  . Codeine Nausea And Vomiting  . Lisinopril-Hydrochlorothiazide Hives    REACTION: lip swelling,facial swelling,rash.  . Metoprolol Other (See Comments)    Does not feel well on it   . Other     Patient states she is allergic to Shrimp, egg whites, tomatoes and dairy products.  Says they make her feel unwell but do not cause hives, SOB or anaphylaxis.  She just avoids eating them because they can make her have a headache.    Review of Systems  Constitutional: Negative for fever and malaise/fatigue.  HENT: Positive for congestion and sinus pain.   Eyes: Negative for blurred vision.  Respiratory: Positive for cough and sputum production. Negative for shortness of breath.   Cardiovascular: Negative for chest pain, palpitations and leg swelling.  Gastrointestinal: Negative for abdominal pain, blood in stool and nausea.  Genitourinary: Negative for dysuria and frequency.  Musculoskeletal: Negative for falls.  Skin: Negative for rash.  Neurological: Negative for dizziness, loss of consciousness and headaches.  Endo/Heme/Allergies: Negative for environmental allergies.  Psychiatric/Behavioral: Negative for depression. The patient is not nervous/anxious.          Objective:    Physical Exam  Constitutional: She is oriented to person, place, and time. She appears well-developed and well-nourished. No distress.  HENT:  Head: Normocephalic and atraumatic.  Nose: Nose normal.  Nasal mucosa boggy and erythematous  Eyes: Right  eye exhibits no discharge. Left eye exhibits no discharge.  Neck: Normal range of motion. Neck supple.  Cardiovascular: Normal rate and regular rhythm.   No murmur heard. Pulmonary/Chest: Effort normal and breath sounds normal.  Abdominal: Soft. Bowel sounds are normal. There is no tenderness.  Musculoskeletal: She exhibits no edema.  Neurological: She is alert and oriented to person, place, and time.  Skin: Skin is warm and dry.  Psychiatric: She has a normal mood and affect.  Nursing note and vitals reviewed.   BP (!) 148/77   Pulse 60   Temp 98.3 F (36.8 C) (Oral)   Ht 4\' 11"  (1.499 m)   Wt 227 lb (103 kg)   SpO2 97%   BMI 45.85 kg/m  Wt Readings from Last 3 Encounters:  06/06/17 227 lb (103 kg)  03/28/17 228 lb 3.2 oz (103.5 kg)  03/25/17 227 lb (103 kg)     Lab Results  Component Value Date   WBC 10.6 (H) 06/06/2017   HGB 12.8 06/06/2017   HCT 38.8 06/06/2017   PLT 332.0 06/06/2017   GLUCOSE 103 (H) 06/06/2017   CHOL 169 06/06/2017   TRIG 60.0 06/06/2017   HDL 38.00 (L) 06/06/2017   LDLCALC 119 (H) 06/06/2017   ALT 17 06/06/2017   AST 17 06/06/2017   NA 142 06/06/2017   K 4.5 06/06/2017   CL 103 06/06/2017   CREATININE 0.93 06/06/2017   BUN 13 06/06/2017   CO2 32 06/06/2017   TSH 1.68 06/06/2017   INR 1.06 01/10/2015   HGBA1C 5.2 06/06/2017    Lab Results  Component Value Date   TSH 1.68 06/06/2017   Lab Results  Component Value Date   WBC 10.6 (H) 06/06/2017   HGB 12.8 06/06/2017   HCT 38.8 06/06/2017   MCV 91.5 06/06/2017   PLT 332.0 06/06/2017   Lab Results  Component Value Date   NA 142 06/06/2017   K 4.5 06/06/2017   CO2 32 06/06/2017   GLUCOSE 103 (H) 06/06/2017    BUN 13 06/06/2017   CREATININE 0.93 06/06/2017   BILITOT 0.5 06/06/2017   ALKPHOS 83 06/06/2017   AST 17 06/06/2017   ALT 17 06/06/2017   PROT 7.7 06/06/2017   ALBUMIN 4.0 06/06/2017   CALCIUM 9.6 06/06/2017   ANIONGAP 7 02/19/2017   GFR 76.71 06/06/2017   Lab Results  Component Value Date   CHOL 169 06/06/2017   Lab Results  Component Value Date   HDL 38.00 (L) 06/06/2017   Lab Results  Component Value Date   LDLCALC 119 (H) 06/06/2017   Lab Results  Component Value Date   TRIG 60.0 06/06/2017   Lab Results  Component Value Date   CHOLHDL 4 06/06/2017   Lab Results  Component Value Date   HGBA1C 5.2 06/06/2017       Assessment & Plan:   Problem List Items Addressed This Visit    Obstructive sleep apnea    Using CPAP nightly struggles with extreme dryness at night recently, referred to pulmonology for further consideration      Relevant Orders   Ambulatory referral to Pulmonology   Essential hypertension    Poorly controlled will alter medications, encouraged DASH diet, minimize caffeine and obtain adequate sleep. Report concerning symptoms and follow up as directed and as needed      Relevant Medications   losartan (COZAAR) 100 MG tablet   chlorthalidone (HYGROTON) 25 MG tablet   Other Relevant Orders   CBC (Completed)  Comprehensive metabolic panel (Completed)   TSH (Completed)   Food allergy    Recent worsening of congestion and allergies referred back to Dr Annamaria Boots for allergy and asthma follow up      Hyperlipidemia, mild    Encouraged heart healthy diet, increase exercise, avoid trans fats, consider a krill oil cap daily      Relevant Medications   losartan (COZAAR) 100 MG tablet   chlorthalidone (HYGROTON) 25 MG tablet   Other Relevant Orders   Lipid panel (Completed)   Hyperglycemia    hgba1c acceptable, minimize simple carbs. Increase exercise as tolerated.      Relevant Orders   Hemoglobin A1c (Completed)   Sinusitis    Encouraged  increased rest and hydration, add probiotics, zinc such as Coldeze or Xicam. Treat fevers as needed. Amoxicillin 500 mg po tid      Relevant Medications   amoxicillin (AMOXIL) 500 MG capsule   Other Relevant Orders   Ambulatory referral to Pulmonology   Sedimentation rate (Completed)      I am having Ms. Delellis start on amoxicillin. I am also having her maintain her multivitamin, Vitamin D, Omega 3-6-9 Fatty Acids (OMEGA 3-6-9 COMPLEX PO), cetirizine, b complex vitamins, ipratropium, omeprazole, Vitamin D (Ergocalciferol), albuterol, amLODipine, losartan, and chlorthalidone.  Meds ordered this encounter  Medications  . amoxicillin (AMOXIL) 500 MG capsule    Sig: Take 1 capsule (500 mg total) by mouth 3 (three) times daily.    Dispense:  30 capsule    Refill:  0  . losartan (COZAAR) 100 MG tablet    Sig: Take 1 tablet (100 mg total) by mouth daily.    Dispense:  90 tablet    Refill:  1  . chlorthalidone (HYGROTON) 25 MG tablet    Sig: Take 1 tablet (25 mg total) by mouth daily.    Dispense:  90 tablet    Refill:  1    Penni Homans, MD

## 2017-06-11 ENCOUNTER — Ambulatory Visit (INDEPENDENT_AMBULATORY_CARE_PROVIDER_SITE_OTHER): Payer: Medicare HMO | Admitting: Family Medicine

## 2017-06-11 ENCOUNTER — Encounter: Payer: Self-pay | Admitting: Family Medicine

## 2017-06-11 VITALS — BP 132/70 | HR 77 | Temp 98.0°F | Resp 18 | Wt 226.2 lb

## 2017-06-11 DIAGNOSIS — Z0000 Encounter for general adult medical examination without abnormal findings: Secondary | ICD-10-CM

## 2017-06-11 DIAGNOSIS — I10 Essential (primary) hypertension: Secondary | ICD-10-CM

## 2017-06-11 DIAGNOSIS — R739 Hyperglycemia, unspecified: Secondary | ICD-10-CM

## 2017-06-11 DIAGNOSIS — R001 Bradycardia, unspecified: Secondary | ICD-10-CM | POA: Diagnosis not present

## 2017-06-11 DIAGNOSIS — G4733 Obstructive sleep apnea (adult) (pediatric): Secondary | ICD-10-CM

## 2017-06-11 DIAGNOSIS — E785 Hyperlipidemia, unspecified: Secondary | ICD-10-CM | POA: Diagnosis not present

## 2017-06-11 DIAGNOSIS — E559 Vitamin D deficiency, unspecified: Secondary | ICD-10-CM | POA: Diagnosis not present

## 2017-06-11 HISTORY — DX: Encounter for general adult medical examination without abnormal findings: Z00.00

## 2017-06-11 HISTORY — DX: Vitamin D deficiency, unspecified: E55.9

## 2017-06-11 NOTE — Assessment & Plan Note (Signed)
Encouraged heart healthy diet, increase exercise, avoid trans fats, consider a krill oil cap daily 

## 2017-06-11 NOTE — Assessment & Plan Note (Signed)
Continue supplements and monitor levels

## 2017-06-11 NOTE — Patient Instructions (Signed)
Consider the new shingles shot called Shingrix. Is a 2 shot series over 2-6 months. Most cost effective to get at the pharmacy Preventive Care 65 Years and Older, Female Preventive care refers to lifestyle choices and visits with your health care provider that can promote health and wellness. What does preventive care include?  A yearly physical exam. This is also called an annual well check.  Dental exams once or twice a year.  Routine eye exams. Ask your health care provider how often you should have your eyes checked.  Personal lifestyle choices, including: ? Daily care of your teeth and gums. ? Regular physical activity. ? Eating a healthy diet. ? Avoiding tobacco and drug use. ? Limiting alcohol use. ? Practicing safe sex. ? Taking low-dose aspirin every day. ? Taking vitamin and mineral supplements as recommended by your health care provider. What happens during an annual well check? The services and screenings done by your health care provider during your annual well check will depend on your age, overall health, lifestyle risk factors, and family history of disease. Counseling Your health care provider may ask you questions about your:  Alcohol use.  Tobacco use.  Drug use.  Emotional well-being.  Home and relationship well-being.  Sexual activity.  Eating habits.  History of falls.  Memory and ability to understand (cognition).  Work and work Statistician.  Reproductive health.  Screening You may have the following tests or measurements:  Height, weight, and BMI.  Blood pressure.  Lipid and cholesterol levels. These may be checked every 5 years, or more frequently if you are over 27 years old.  Skin check.  Lung cancer screening. You may have this screening every year starting at age 52 if you have a 30-pack-year history of smoking and currently smoke or have quit within the past 15 years.  Fecal occult blood test (FOBT) of the stool. You may have  this test every year starting at age 54.  Flexible sigmoidoscopy or colonoscopy. You may have a sigmoidoscopy every 5 years or a colonoscopy every 10 years starting at age 82.  Hepatitis C blood test.  Hepatitis B blood test.  Sexually transmitted disease (STD) testing.  Diabetes screening. This is done by checking your blood sugar (glucose) after you have not eaten for a while (fasting). You may have this done every 1-3 years.  Bone density scan. This is done to screen for osteoporosis. You may have this done starting at age 58.  Mammogram. This may be done every 1-2 years. Talk to your health care provider about how often you should have regular mammograms.  Talk with your health care provider about your test results, treatment options, and if necessary, the need for more tests. Vaccines Your health care provider may recommend certain vaccines, such as:  Influenza vaccine. This is recommended every year.  Tetanus, diphtheria, and acellular pertussis (Tdap, Td) vaccine. You may need a Td booster every 10 years.  Varicella vaccine. You may need this if you have not been vaccinated.  Zoster vaccine. You may need this after age 69.  Measles, mumps, and rubella (MMR) vaccine. You may need at least one dose of MMR if you were born in 1957 or later. You may also need a second dose.  Pneumococcal 13-valent conjugate (PCV13) vaccine. One dose is recommended after age 78.  Pneumococcal polysaccharide (PPSV23) vaccine. One dose is recommended after age 26.  Meningococcal vaccine. You may need this if you have certain conditions.  Hepatitis A vaccine. You  may need this if you have certain conditions or if you travel or work in places where you may be exposed to hepatitis A.  Hepatitis B vaccine. You may need this if you have certain conditions or if you travel or work in places where you may be exposed to hepatitis B.  Haemophilus influenzae type b (Hib) vaccine. You may need this if  you have certain conditions.  Talk to your health care provider about which screenings and vaccines you need and how often you need them. This information is not intended to replace advice given to you by your health care provider. Make sure you discuss any questions you have with your health care provider. Document Released: 10/07/2015 Document Revised: 05/30/2016 Document Reviewed: 07/12/2015 Elsevier Interactive Patient Education  2017 [employer].

## 2017-06-11 NOTE — Progress Notes (Signed)
Subjective:  I acted as a Education administrator for Dr. Charlett Blake. [place], [place]  Patient ID: Anne Barnes, female    DOB: [birth year], 71 y.o.   MRN: [id number]  No chief complaint on file.   HPI  Patient is in today for an annual exam and follow up on HTN, DM, Hyperlipidemia, and other concerns. She has not been hospitalized recently and no febrile illness or acute complaints. She continues to struggle with SOB with activity but it is not worsening. She had her last colonoscopy in 2011 with Dr Collene Mares. She is trying to maintain a heart healthy diet but has not been physically active. Denies CP/palp/HA/congestion/fevers/GI or GU c/o. Taking meds as prescribed  Patient Care Team: Mosie Lukes, MD as PCP - General (Family Medicine) Juanita Craver, MD (Gastroenterology) Deneise Lever, MD as Consulting Physician (Pulmonary Disease)   Past Medical History:  Diagnosis Date  . ALLERGIC RHINITIS   . Allergic urticaria 08/01/2014   Patient reports allergies to shrimp, egg whites, tomatoes, dairy   . Bradycardia 02/25/2017  . Chiari malformation    Noted MRI brain 09/2013 - s/p neuro eval for same  . DIVERTICULOSIS, COLON   . Frequent headaches   . GERD   . H/O measles   . H/O mumps   . Hearing loss 01/18/2017  . History of chicken pox   . HYPERTENSION   . Hypocalcemia 02/25/2017  . IBS (irritable bowel syndrome) 05/26/2015  . OBSTRUCTIVE SLEEP APNEA 12/2008 dx   noncompliant with CPAP qhs  . Preventative health care 06/11/2017  . Sciatica    right side  . Sinusitis 06/06/2017  . 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 Vaginitis 01/18/2017  . Vitamin D deficiency 06/11/2017    Past Surgical History:  Procedure Laterality Date  . ABDOMINAL HYSTERECTOMY  1986  . BREAST SURGERY  1973   Breast biopsy  . UMBILICAL HERNIA REPAIR      Family History  Problem Relation Age of Onset  . Arthritis Mother        died of complications from hip surgery  . Arthritis Father   . Kidney disease Father   . Pneumonia  Father   . Heart disease Father   . Lupus Daughter   . Rheum arthritis Daughter   . Sjogren's syndrome Daughter   . Fibromyalgia Daughter   . Diabetes Brother   . Heart disease Brother   . Heart attack Brother   . Cancer Brother        throat and lung  . Alcohol abuse Brother   . Kidney disease Sister        dialysis  . Obesity Sister   . Hypertension Maternal Grandfather   . Hypertension Sister   . Kidney disease Sister        dialysis  . Pneumonia Brother   . Hypertension Daughter   . Graves' disease Daughter   . Hypertension Daughter   . Alcohol abuse Other        parent  . Ataxia Neg Hx   . Chorea Neg Hx   . Dementia Neg Hx   . Mental retardation Neg Hx   . Migraines Neg Hx   . Multiple sclerosis Neg Hx   . Neurofibromatosis Neg Hx   . Neuropathy Neg Hx   . Parkinsonism Neg Hx   . Seizures Neg Hx   . Stroke Neg Hx     Social History   Social History  . Marital status: Married  Spouse name: N/A  . Number of children: N/A  . Years of education: N/A   Occupational History  . retired    Social History Main Topics  . Smoking status: Never Smoker  . Smokeless tobacco: Never Used     Comment: Married, lives with spouse, dtr 2 g-kids.  . Alcohol use No     Comment: rare  . Drug use: No  . Sexual activity: Not on file     Comment: lives with husband, no dietary restrictions avoid foods allergic to, retired   Other Topics Concern  . Not on file   Social History Narrative  . No narrative on file    Outpatient Medications Prior to Visit  Medication Sig Dispense Refill  . albuterol (PROVENTIL HFA;VENTOLIN HFA) 108 (90 Base) MCG/ACT inhaler Inhale 2 puffs into the lungs every 4 (four) hours as needed for wheezing or shortness of breath.    Marland Kitchen amLODipine (NORVASC) 5 MG tablet Take 1 tablet (5 mg total) by mouth daily. 90 tablet 3  . b complex vitamins tablet Take 1 tablet by mouth daily.    . cetirizine (ZYRTEC) 10 MG tablet Take 1 tablet (10 mg total) by  mouth daily. 30 tablet 11  . chlorthalidone (HYGROTON) 25 MG tablet Take 1 tablet (25 mg total) by mouth daily. 90 tablet 1  . Cholecalciferol (VITAMIN D) 2000 UNITS CAPS Take by mouth daily.     Marland Kitchen ipratropium (ATROVENT) 0.06 % nasal spray 1-2 puffs each nostril twice daily as needed 15 mL 11  . losartan (COZAAR) 100 MG tablet Take 1 tablet (100 mg total) by mouth daily. 90 tablet 1  . Multiple Vitamin (MULTIVITAMIN) tablet Take 1 tablet by mouth daily.      . Omega 3-6-9 Fatty Acids (OMEGA 3-6-9 COMPLEX PO) Take 1 capsule by mouth daily.      Marland Kitchen omeprazole (PRILOSEC) 20 MG capsule Take 1 capsule (20 mg total) by mouth daily. 90 capsule 3  . amoxicillin (AMOXIL) 500 MG capsule Take 1 capsule (500 mg total) by mouth 3 (three) times daily. 30 capsule 0  . Vitamin D, Ergocalciferol, (DRISDOL) 50000 units CAPS capsule Take 1 capsule (50,000 Units total) by mouth every 7 (seven) days. 4 capsule 4   No facility-administered medications prior to visit.     Allergies  Allergen Reactions  . Codeine Nausea And Vomiting  . Lisinopril-Hydrochlorothiazide Hives    REACTION: lip swelling,facial swelling,rash.  . Metoprolol Other (See Comments)    Does not feel well on it   . Other     Patient states she is allergic to Shrimp, egg whites, tomatoes and dairy products.  Says they make her feel unwell but do not cause hives, SOB or anaphylaxis.  She just avoids eating them because they can make her have a headache.    Review of Systems  Constitutional: Positive for malaise/fatigue. Negative for chills and fever.  HENT: Negative for congestion and hearing loss.   Eyes: Negative for discharge.  Respiratory: Positive for shortness of breath. Negative for cough and sputum production.   Cardiovascular: Negative for chest pain, palpitations and leg swelling.  Gastrointestinal: Negative for abdominal pain, blood in stool, constipation, diarrhea, heartburn, nausea and vomiting.  Genitourinary: Negative for  dysuria, frequency, hematuria and urgency.  Musculoskeletal: Negative for back pain, falls and myalgias.  Skin: Negative for rash.  Neurological: Negative for dizziness, sensory change, loss of consciousness, weakness and headaches.  Endo/Heme/Allergies: Negative for environmental allergies. Does not bruise/bleed easily.  Psychiatric/Behavioral:  Negative for depression, hallucinations and suicidal ideas. The patient is not nervous/anxious and does not have insomnia.        Objective:    Physical Exam  Constitutional: She is oriented to person, place, and time. She appears well-developed and well-nourished. No distress.  HENT:  Head: Normocephalic and atraumatic.  Eyes: Conjunctivae are normal.  Neck: Neck supple. No thyromegaly present.  Cardiovascular: Normal rate, regular rhythm and normal heart sounds.   No murmur heard. Pulmonary/Chest: Effort normal and breath sounds normal. No respiratory distress.  Abdominal: Soft. Bowel sounds are normal. She exhibits no distension and no mass. There is no tenderness.  Musculoskeletal: She exhibits no edema.  Lymphadenopathy:    She has no cervical adenopathy.  Neurological: She is alert and oriented to person, place, and time.  Skin: Skin is warm and dry.  Psychiatric: She has a normal mood and affect. Her behavior is normal.    BP 132/70 (BP Location: Left Arm, Patient Position: Sitting, Cuff Size: Normal)   Pulse 77   Temp 98 F (36.7 C) (Oral)   Resp 18   Wt 226 lb 3.2 oz (102.6 kg)   SpO2 97%   BMI 45.69 kg/m  Wt Readings from Last 3 Encounters:  06/11/17 226 lb 3.2 oz (102.6 kg)  06/06/17 227 lb (103 kg)  03/28/17 228 lb 3.2 oz (103.5 kg)   BP Readings from Last 3 Encounters:  06/11/17 132/70  06/06/17 (!) 148/77  03/28/17 126/70     Immunization History  Administered Date(s) Administered  . Influenza, High Dose Seasonal PF 07/15/2013  . Influenza,inj,Quad PF,6+ Mos 08/29/2015  . Pneumococcal Conjugate-13 11/30/2015   . Pneumococcal Polysaccharide-23 02/26/2013  . Td 09/25/2003  . Tdap 08/29/2015    Health Maintenance  Topic Date Due  . INFLUENZA VACCINE  07/06/2017 (Originally 04/24/2017)  . MAMMOGRAM  11/21/2017  . COLONOSCOPY  10/22/2019  . TETANUS/TDAP  08/28/2025  . DEXA SCAN  Completed  . Hepatitis C Screening  Completed  . PNA vac Low Risk Adult  Completed    Lab Results  Component Value Date   WBC 10.6 (H) 06/06/2017   HGB 12.8 06/06/2017   HCT 38.8 06/06/2017   PLT 332.0 06/06/2017   GLUCOSE 103 (H) 06/06/2017   CHOL 169 06/06/2017   TRIG 60.0 06/06/2017   HDL 38.00 (L) 06/06/2017   LDLCALC 119 (H) 06/06/2017   ALT 17 06/06/2017   AST 17 06/06/2017   NA 142 06/06/2017   K 4.5 06/06/2017   CL 103 06/06/2017   CREATININE 0.93 06/06/2017   BUN 13 06/06/2017   CO2 32 06/06/2017   TSH 1.68 06/06/2017   INR 1.06 01/10/2015   HGBA1C 5.2 06/06/2017    Lab Results  Component Value Date   TSH 1.68 06/06/2017   Lab Results  Component Value Date   WBC 10.6 (H) 06/06/2017   HGB 12.8 06/06/2017   HCT 38.8 06/06/2017   MCV 91.5 06/06/2017   PLT 332.0 06/06/2017   Lab Results  Component Value Date   NA 142 06/06/2017   K 4.5 06/06/2017   CO2 32 06/06/2017   GLUCOSE 103 (H) 06/06/2017   BUN 13 06/06/2017   CREATININE 0.93 06/06/2017   BILITOT 0.5 06/06/2017   ALKPHOS 83 06/06/2017   AST 17 06/06/2017   ALT 17 06/06/2017   PROT 7.7 06/06/2017   ALBUMIN 4.0 06/06/2017   CALCIUM 9.6 06/06/2017   ANIONGAP 7 02/19/2017   GFR 76.71 06/06/2017   Lab Results  Component Value Date   CHOL 169 06/06/2017   Lab Results  Component Value Date   HDL 38.00 (L) 06/06/2017   Lab Results  Component Value Date   LDLCALC 119 (H) 06/06/2017   Lab Results  Component Value Date   TRIG 60.0 06/06/2017   Lab Results  Component Value Date   CHOLHDL 4 06/06/2017   Lab Results  Component Value Date   HGBA1C 5.2 06/06/2017         Assessment & Plan:   Problem List Items  Addressed This Visit    Obstructive sleep apnea    Not using CPA has been seeing Dr Annamaria Boots is awaiting a new appt to repeat testinggg      Essential hypertension    Well controlled, no changes to meds. Encouraged heart healthy diet such as the DASH diet and exercise as tolerated.       Relevant Orders   CBC   Comprehensive metabolic panel   TSH   Hyperlipidemia, mild    Encouraged heart healthy diet, increase exercise, avoid trans fats, consider a krill oil cap daily      Relevant Orders   Lipid panel   Hyperglycemia    hgba1c acceptable, minimize simple carbs. Increase exercise as tolerated.       Relevant Orders   Hemoglobin A1c   Bradycardia    Is following with cardiology and has an appt with June 24, 2017.      Vitamin D deficiency    Continue supplements and monitor levels      Relevant Orders   VITAMIN D 25 Hydroxy (Vit-D Deficiency, Fractures)   Preventative health care    Patient encouraged to maintain heart healthy diet, regular exercise, adequate sleep. Consider daily probiotics. Take medications as prescribed. Labs reviewed with patient, immunizations reviewed. Encouraged MGM declines for nwo will get one before the end of year. Colonoscopy due in 2021         I have discontinued Ms. Zettlemoyer's Vitamin D (Ergocalciferol) and amoxicillin. I am also having her maintain her multivitamin, Vitamin D, Omega 3-6-9 Fatty Acids (OMEGA 3-6-9 COMPLEX PO), cetirizine, b complex vitamins, ipratropium, omeprazole, albuterol, amLODipine, losartan, and chlorthalidone.  No orders of the defined types were placed in this encounter.   CMA served as Education administrator during this visit. History, Physical and Plan performed by medical provider. Documentation and orders reviewed and attested to.  Penni Homans, MD

## 2017-06-11 NOTE — Assessment & Plan Note (Signed)
Not using CPA has been seeing Dr Annamaria Boots is awaiting a new appt to repeat testinggg

## 2017-06-11 NOTE — Assessment & Plan Note (Signed)
Is following with cardiology and has an appt with June 24, 2017.

## 2017-06-11 NOTE — Assessment & Plan Note (Signed)
Well controlled, no changes to meds. Encouraged heart healthy diet such as the DASH diet and exercise as tolerated.  °

## 2017-06-11 NOTE — Assessment & Plan Note (Addendum)
Patient encouraged to maintain heart healthy diet, regular exercise, adequate sleep. Consider daily probiotics. Take medications as prescribed. Labs reviewed with patient, immunizations reviewed. Encouraged MGM declines for nwo will get one before the end of year. Colonoscopy due in 2021

## 2017-06-11 NOTE — Assessment & Plan Note (Signed)
hgba1c acceptable, minimize simple carbs. Increase exercise as tolerated.  

## 2017-06-24 ENCOUNTER — Ambulatory Visit (INDEPENDENT_AMBULATORY_CARE_PROVIDER_SITE_OTHER): Payer: Medicare HMO | Admitting: Cardiology

## 2017-06-24 ENCOUNTER — Encounter: Payer: Self-pay | Admitting: Cardiology

## 2017-06-24 VITALS — BP 136/78 | HR 59 | Ht 59.0 in | Wt 229.0 lb

## 2017-06-24 DIAGNOSIS — E7800 Pure hypercholesterolemia, unspecified: Secondary | ICD-10-CM | POA: Diagnosis not present

## 2017-06-24 DIAGNOSIS — G4733 Obstructive sleep apnea (adult) (pediatric): Secondary | ICD-10-CM | POA: Diagnosis not present

## 2017-06-24 DIAGNOSIS — R001 Bradycardia, unspecified: Secondary | ICD-10-CM

## 2017-06-24 DIAGNOSIS — R600 Localized edema: Secondary | ICD-10-CM

## 2017-06-24 DIAGNOSIS — I10 Essential (primary) hypertension: Secondary | ICD-10-CM | POA: Diagnosis not present

## 2017-06-24 DIAGNOSIS — I951 Orthostatic hypotension: Secondary | ICD-10-CM | POA: Diagnosis not present

## 2017-06-24 MED ORDER — FUROSEMIDE 40 MG PO TABS: 40.0000 mg | ORAL_TABLET | Freq: Every day | ORAL | 3 refills | Status: DC

## 2017-06-24 MED ORDER — AMLODIPINE BESYLATE 2.5 MG PO TABS: 2.5000 mg | ORAL_TABLET | Freq: Every day | ORAL | 3 refills | Status: DC

## 2017-06-24 NOTE — Patient Instructions (Signed)
Medication Instructions:   STOP TAKING CHLORTHALIDONE NOW  DECREASE YOUR AMLODIPINE TO 2.5 MG ONCE DAILY  START TAKING LASIX 40 MG ONCE DAILY     Follow-Up:  DR Meda Coffee ON July 25, 2017 AT 8:40 AM---PLEASE ADD THE PATIENT TO THIS TIME PER DR Meda Coffee       If you need a refill on your cardiac medications before your next appointment, please call your pharmacy.

## 2017-06-24 NOTE — Progress Notes (Signed)
Cardiology Office Note    Date:  06/24/2017   ID:  Anne Barnes, DOB 05/07/1946, MRN [id number]  PCP:  Mosie Lukes, MD  Cardiologist:  Ena Dawley, MD   Chief complaint: Orthostatic hypotension, dizziness, lower extremity edema.  History of Present Illness:  Anne Barnes is a 71 y.o. female was seen in June 2018 for bradycardia down to low 50s while on beta blocker. The patient was asymptomatic at the time and states that she has always had slow heart rate. Pt with hx of HTN, hyperglycemia, mild HLD, osteoarthritis, tinnitus, IBS, OSA intolerant to CPAP and headaches thought to be allergies.  Dr. Annamaria Boots follows her sleep apnea.     Echo done 02/2017 EF was 60-65% no RWMA,  No significant valvular abnormalities.  06/24/2017, the patient is coming after 1 months, she complains that she has dizziness every time she, she's not sure if it's her blood pressure or that she has broken her glasses recently. She has also had lower extremity edema that is mild but constant for the last few months. She is underwent stress testing that showed no prior infarct or ischemia and normal LVEF. She gets occasional chest pain there at rest and reproducible. She is suspicious that it's a pulled muscle.  Past Medical History:  Diagnosis Date  . ALLERGIC RHINITIS   . Allergic urticaria 08/01/2014   Patient reports allergies to shrimp, egg whites, tomatoes, dairy   . Bradycardia 02/25/2017  . Chiari malformation    Noted MRI brain 09/2013 - s/p neuro eval for same  . DIVERTICULOSIS, COLON   . Frequent headaches   . GERD   . H/O measles   . H/O mumps   . Hearing loss 01/18/2017  . History of chicken pox   . HYPERTENSION   . Hypocalcemia 02/25/2017  . IBS (irritable bowel syndrome) 05/26/2015  . OBSTRUCTIVE SLEEP APNEA 12/2008 dx   noncompliant with CPAP qhs  . Preventative health care 06/11/2017  . Sciatica    right side  . Sinusitis 06/06/2017  . 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 Vaginitis  01/18/2017  . Vitamin D deficiency 06/11/2017    Past Surgical History:  Procedure Laterality Date  . ABDOMINAL HYSTERECTOMY  1986  . BREAST SURGERY  1973   Breast biopsy  . UMBILICAL HERNIA REPAIR      Current Medications: Outpatient Medications Prior to Visit  Medication Sig Dispense Refill  . albuterol (PROVENTIL HFA;VENTOLIN HFA) 108 (90 Base) MCG/ACT inhaler Inhale 2 puffs into the lungs every 4 (four) hours as needed for wheezing or shortness of breath.    Marland Kitchen b complex vitamins tablet Take 1 tablet by mouth daily.    . cetirizine (ZYRTEC) 10 MG tablet Take 1 tablet (10 mg total) by mouth daily. 30 tablet 11  . Cholecalciferol (VITAMIN D) 2000 UNITS CAPS Take by mouth daily.     Marland Kitchen ipratropium (ATROVENT) 0.06 % nasal spray 1-2 puffs each nostril twice daily as needed 15 mL 11  . losartan (COZAAR) 100 MG tablet Take 1 tablet (100 mg total) by mouth daily. 90 tablet 1  . Multiple Vitamin (MULTIVITAMIN) tablet Take 1 tablet by mouth daily.      . Omega 3-6-9 Fatty Acids (OMEGA 3-6-9 COMPLEX PO) Take 1 capsule by mouth daily.      Marland Kitchen omeprazole (PRILOSEC) 20 MG capsule Take 1 capsule (20 mg total) by mouth daily. 90 capsule 3  . amLODipine (NORVASC) 5 MG tablet Take 1 tablet (5  mg total) by mouth daily. 90 tablet 3  . chlorthalidone (HYGROTON) 25 MG tablet Take 1 tablet (25 mg total) by mouth daily. 90 tablet 1   No facility-administered medications prior to visit.      Allergies:   Codeine; Lisinopril-hydrochlorothiazide; Metoprolol; and Other   Social History   Social History  . Marital status: Married    Spouse name: N/A  . Number of children: N/A  . Years of education: N/A   Occupational History  . retired    Social History Main Topics  . Smoking status: Never Smoker  . Smokeless tobacco: Never Used     Comment: Married, lives with spouse, dtr 2 g-kids.  . Alcohol use No     Comment: rare  . Drug use: No  . Sexual activity: Not Asked     Comment: lives with husband,  no dietary restrictions avoid foods allergic to, retired   Other Topics Concern  . None   Social History Narrative  . None     Family History:  The patient's family history includes Alcohol abuse in her brother and other; Arthritis in her father and mother; Cancer in her brother; Diabetes in her brother; Fibromyalgia in her daughter; Berenice Primas' disease in her daughter; Heart attack in her brother; Heart disease in her brother and father; Hypertension in her daughter, daughter, maternal grandfather, and sister; Kidney disease in her father, sister, and sister; Lupus in her daughter; Obesity in her sister; Pneumonia in her brother and father; Rheum arthritis in her daughter; Sjogren's syndrome in her daughter.   ROS:   Please see the history of present illness.    ROS All other systems reviewed and are negative.   PHYSICAL EXAM:   VS:  BP 136/78   Pulse (!) 59   Ht 4\' 11"  (1.499 m)   Wt 229 lb (103.9 kg)   SpO2 98%   BMI 46.25 kg/m    GEN: Well nourished, well developed, in no acute distress  HEENT: normal  Neck: no JVD, carotid bruits, or masses Cardiac: RRR; no murmurs, rubs, or gallops, bilateral pitting edema up to the ankles. Respiratory:  clear to auscultation bilaterally, normal work of breathing GI: soft, nontender, nondistended, + BS MS: no deformity or atrophy  Skin: warm and dry, no rash Neuro:  Alert and Oriented x 3, Strength and sensation are intact Psych: euthymic mood, full affect  Wt Readings from Last 3 Encounters:  06/24/17 229 lb (103.9 kg)  06/11/17 226 lb 3.2 oz (102.6 kg)  06/06/17 227 lb (103 kg)      Studies/Labs Reviewed:   EKG:  EKG is not ordered today.    Recent Labs: 06/06/2017: ALT 17; BUN 13; Creatinine, Ser 0.93; Hemoglobin 12.8; Platelets 332.0; Potassium 4.5; Sodium 142; TSH 1.68   Lipid Panel    Component Value Date/Time   CHOL 169 06/06/2017 1150   TRIG 60.0 06/06/2017 1150   TRIG 55 12/30/2009   HDL 38.00 (L) 06/06/2017 1150    CHOLHDL 4 06/06/2017 1150   VLDL 12.0 06/06/2017 1150   LDLCALC 119 (H) 06/06/2017 1150    Additional studies/ records that were reviewed today include:  TTE: 02/2017 - Left ventricle: The cavity size was normal. Systolic function was   normal. The estimated ejection fraction was in the range of 60%   to 65%. Wall motion was normal; there were no regional wall   motion abnormalities. There was no evidence of elevated   ventricular filling pressure by Doppler parameters.  ASSESSMENT:    No diagnosis found.   PLAN:  In order of problems listed above:  1.  Orthostatic hypotension, I will decrease amlodipine back to 2.5 mg daily. If her symptoms don't resolve besides getting new prescription glasses he might recommend vestibular exercises.  2. Bilateral lower extremity edema - we will decrease amlodipine to 2.5 mg daily, discontinue chlorthalidone and start Lasix 40 mg daily.  3. Sinus brady with fatigue.  Atenolol was discontinued her heart rate remained slow but with improved energy.  4. HTN today - controlled today we'll recheck in one month's with new medication changes.    4. OSA not wearing cpap which could be part of brady and fatigue.  I have asked her to follow up with Dr. Annamaria Boots- pulmonology.   5. Hx of asthma  6. GERD takes prilosec PRN.     7. HLD - LDL 120, she wants to start conservative management first she just started to eat healthier and exercise we will recheck in one month.  Current medicines are reviewed with the patient today.  The patient Has no concerns regarding medicines.  Medication Adjustments/Labs and Tests Ordered: Current medicines are reviewed at length with the patient today.  Concerns regarding medicines are outlined above.  Medication changes, Labs and Tests ordered today are listed in the Patient Instructions below. There are no Patient Instructions on file for this visit.   Signed, Ena Dawley, MD  06/24/2017 11:05 AM    [address], [place],   [address] Phone: [phone]; Fax: [phone]

## 2017-07-11 ENCOUNTER — Encounter: Payer: Self-pay | Admitting: *Deleted

## 2017-07-25 ENCOUNTER — Ambulatory Visit (INDEPENDENT_AMBULATORY_CARE_PROVIDER_SITE_OTHER): Payer: Medicare HMO | Admitting: Cardiology

## 2017-07-25 ENCOUNTER — Encounter (INDEPENDENT_AMBULATORY_CARE_PROVIDER_SITE_OTHER): Payer: Self-pay

## 2017-07-25 VITALS — BP 124/64 | HR 72 | Ht 59.0 in | Wt 226.0 lb

## 2017-07-25 DIAGNOSIS — E7800 Pure hypercholesterolemia, unspecified: Secondary | ICD-10-CM

## 2017-07-25 DIAGNOSIS — G4733 Obstructive sleep apnea (adult) (pediatric): Secondary | ICD-10-CM | POA: Diagnosis not present

## 2017-07-25 DIAGNOSIS — I951 Orthostatic hypotension: Secondary | ICD-10-CM | POA: Diagnosis not present

## 2017-07-25 DIAGNOSIS — R600 Localized edema: Secondary | ICD-10-CM | POA: Diagnosis not present

## 2017-07-25 DIAGNOSIS — I10 Essential (primary) hypertension: Secondary | ICD-10-CM | POA: Diagnosis not present

## 2017-07-25 MED ORDER — FUROSEMIDE 40 MG PO TABS: 40.0000 mg | ORAL_TABLET | Freq: Every day | ORAL | 3 refills | Status: DC

## 2017-07-25 MED ORDER — AMLODIPINE BESYLATE 2.5 MG PO TABS: 2.5000 mg | ORAL_TABLET | Freq: Every day | ORAL | 3 refills | Status: DC

## 2017-07-25 NOTE — Progress Notes (Signed)
Cardiology Office Note    Date:  07/25/2017   ID:  Anne Barnes, DOB 12-Aug-1946, MRN [id number]  PCP:  Mosie Lukes, MD  Cardiologist:  Ena Dawley, MD   Chief complaint: Orthostatic hypotension, dizziness, lower extremity edema.  History of Present Illness:  Anne Barnes is a 71 y.o. female was seen in June 2018 for bradycardia down to low 50s while on beta blocker. The patient was asymptomatic at the time and states that she has always had slow heart rate. Pt with hx of HTN, hyperglycemia, mild HLD, osteoarthritis, tinnitus, IBS, OSA intolerant to CPAP and headaches thought to be allergies.  Dr. Annamaria Boots follows her sleep apnea.     Echo done 02/2017 EF was 60-65% no RWMA,  No significant valvular abnormalities.  06/24/2017, the patient is coming after 1 months, she complains that she has dizziness every time she, she's not sure if it's her blood pressure or that she has broken her glasses recently. She has also had lower extremity edema that is mild but constant for the last few months. She is underwent stress testing that showed no prior infarct or ischemia and normal LVEF. She gets occasional chest pain there at rest and reproducible. She is suspicious that it's a pulled muscle.  July 25, 2017 -patient is coming after 1 month, and the last visit she was complaining of orthostatic hypotension, her atenolol was discontinued and amlodipine was decreased to 2.5 mg daily.  She reports some improvement in her symptoms.  It usually happens when she gets up from bending position, however no falls.  She states that she wakes up with chest pressure however does not feel it while she is walking.  She attributes it to allergies and postnasal drip, she uses Flonase and albuterol.  She states that she works around the house all day and needs to take breaks but that has not changed in the last several years.  She used to walk 4 miles a day but stopped.  Past Medical History:  Diagnosis Date    . ALLERGIC RHINITIS   . Allergic urticaria 08/01/2014   Patient reports allergies to shrimp, egg whites, tomatoes, dairy   . Bradycardia 02/25/2017  . Chiari malformation    Noted MRI brain 09/2013 - s/p neuro eval for same  . DIVERTICULOSIS, COLON   . Frequent headaches   . GERD   . H/O measles   . H/O mumps   . Hearing loss 01/18/2017  . History of chicken pox   . HYPERTENSION   . Hypocalcemia 02/25/2017  . IBS (irritable bowel syndrome) 05/26/2015  . OBSTRUCTIVE SLEEP APNEA 12/2008 dx   noncompliant with CPAP qhs  . Preventative health care 06/11/2017  . Sciatica    right side  . Sinusitis 06/06/2017  . 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 Vaginitis 01/18/2017  . Vitamin D deficiency 06/11/2017    Past Surgical History:  Procedure Laterality Date  . ABDOMINAL HYSTERECTOMY  1986  . BREAST SURGERY  1973   Breast biopsy  . UMBILICAL HERNIA REPAIR      Current Medications: Outpatient Medications Prior to Visit  Medication Sig Dispense Refill  . albuterol (PROVENTIL HFA;VENTOLIN HFA) 108 (90 Base) MCG/ACT inhaler Inhale 2 puffs into the lungs every 4 (four) hours as needed for wheezing or shortness of breath.    Marland Kitchen amLODipine (NORVASC) 2.5 MG tablet Take 1 tablet (2.5 mg total) by mouth daily. 90 tablet 3  . b complex vitamins tablet Take 1  tablet by mouth daily.    . cetirizine (ZYRTEC) 10 MG tablet Take 1 tablet (10 mg total) by mouth daily. 30 tablet 11  . Cholecalciferol (VITAMIN D) 2000 UNITS CAPS Take by mouth daily.     . furosemide (LASIX) 40 MG tablet Take 1 tablet (40 mg total) by mouth daily. 90 tablet 3  . ipratropium (ATROVENT) 0.06 % nasal spray 1-2 puffs each nostril twice daily as needed 15 mL 11  . losartan (COZAAR) 100 MG tablet Take 1 tablet (100 mg total) by mouth daily. 90 tablet 1  . Multiple Vitamin (MULTIVITAMIN) tablet Take 1 tablet by mouth daily.      . Omega 3-6-9 Fatty Acids (OMEGA 3-6-9 COMPLEX PO) Take 1 capsule by mouth daily.      Marland Kitchen omeprazole  (PRILOSEC) 20 MG capsule Take 1 capsule (20 mg total) by mouth daily. 90 capsule 3   No facility-administered medications prior to visit.      Allergies:   Codeine; Lisinopril-hydrochlorothiazide; Metoprolol; and Other   Social History   Social History  . Marital status: Married    Spouse name: N/A  . Number of children: N/A  . Years of education: N/A   Occupational History  . retired    Social History Main Topics  . Smoking status: Never Smoker  . Smokeless tobacco: Never Used     Comment: Married, lives with spouse, dtr 2 g-kids.  . Alcohol use No     Comment: rare  . Drug use: No  . Sexual activity: Not on file     Comment: lives with husband, no dietary restrictions avoid foods allergic to, retired   Other Topics Concern  . Not on file   Social History Narrative  . No narrative on file     Family History:  The patient's family history includes Alcohol abuse in her brother and other; Arthritis in her father and mother; Diabetes in her brother; Fibromyalgia in her daughter; Berenice Primas' disease in her daughter; Heart attack in her brother; Heart disease in her brother and father; Hypertension in her daughter, daughter, maternal grandfather, and sister; Kidney disease in her father, sister, and sister; Lung cancer in her brother; Lupus in her daughter; Obesity in her sister; Pneumonia in her brother and father; Rheum arthritis in her daughter; Sjogren's syndrome in her daughter; Throat cancer in her brother.   ROS:   Please see the history of present illness.    ROS All other systems reviewed and are negative.   PHYSICAL EXAM:   VS:  There were no vitals taken for this visit.   GEN: Well nourished, well developed, in no acute distress  HEENT: normal  Neck: no JVD, carotid bruits, or masses Cardiac: RRR; no murmurs, rubs, or gallops, bilateral pitting edema up to the ankles. Respiratory:  clear to auscultation bilaterally, normal work of breathing GI: soft, nontender,  nondistended, + BS MS: no deformity or atrophy  Skin: warm and dry, no rash Neuro:  Alert and Oriented x 3, Strength and sensation are intact Psych: euthymic mood, full affect  Wt Readings from Last 3 Encounters:  06/24/17 229 lb (103.9 kg)  06/11/17 226 lb 3.2 oz (102.6 kg)  06/06/17 227 lb (103 kg)      Studies/Labs Reviewed:   EKG:  EKG is not ordered today.    Recent Labs: 06/06/2017: ALT 17; BUN 13; Creatinine, Ser 0.93; Hemoglobin 12.8; Platelets 332.0; Potassium 4.5; Sodium 142; TSH 1.68   Lipid Panel    Component Value  Date/Time   CHOL 169 06/06/2017 1150   TRIG 60.0 06/06/2017 1150   TRIG 55 12/30/2009   HDL 38.00 (L) 06/06/2017 1150   CHOLHDL 4 06/06/2017 1150   VLDL 12.0 06/06/2017 1150   LDLCALC 119 (H) 06/06/2017 1150    Additional studies/ records that were reviewed today include:  TTE: 02/2017 - Left ventricle: The cavity size was normal. Systolic function was   normal. The estimated ejection fraction was in the range of 60%   to 65%. Wall motion was normal; there were no regional wall   motion abnormalities. There was no evidence of elevated   ventricular filling pressure by Doppler parameters.   ASSESSMENT:    No diagnosis found.   PLAN:  In order of problems listed above:  1.  Orthostatic hypotension, we have discontinued atenolol and decrease amlodipine to 2.5 mg daily her symptoms have improved somewhat.  2. Bilateral lower extremity edema -improved with decreased amlodipine to 2.5 mg daily, and initiation of Lasix 40 mg daily.  We will continue.  Check CMP at the next visit.  3. Sinus brady with fatigue.  Atenolol was discontinued her heart rate remained slow but with improved energy.  4. HTN today - controlled.    4. OSA not wearing cpap which could be part of brady and fatigue.  I have asked her to follow up with Dr. Annamaria Boots- pulmonology.   5. Hx of asthma, albuterol as needed  6. GERD takes prilosec PRN.     7. HLD - LDL 120, she  wants to start conservative management, she is strongly advised to start walking a day daily starting with 1 mile.  Will check lipids and CMP at the next visit.  Current medicines are reviewed with the patient today.  The patient Has no concerns regarding medicines.  Medication Adjustments/Labs and Tests Ordered: Current medicines are reviewed at length with the patient today.  Concerns regarding medicines are outlined above.  Medication changes, Labs and Tests ordered today are listed in the Patient Instructions below. There are no Patient Instructions on file for this visit.   Signed, Ena Dawley, MD  07/25/2017 8:53 AM    [address], [place], [place]  [address] Phone: [phone]; Fax: [phone]

## 2017-07-25 NOTE — Patient Instructions (Signed)

## 2017-08-19 ENCOUNTER — Telehealth: Payer: Self-pay | Admitting: Internal Medicine

## 2017-08-19 MED ORDER — IPRATROPIUM BROMIDE 0.06 % NA SOLN: NASAL | 2 refills | Status: DC

## 2017-08-19 MED ORDER — ALBUTEROL SULFATE HFA 108 (90 BASE) MCG/ACT IN AERS: 2.0000 | INHALATION_SPRAY | RESPIRATORY_TRACT | 2 refills | Status: DC | PRN

## 2017-08-19 NOTE — Addendum Note (Signed)
Addended by: Jannette Spanner on: 08/19/2017 04:28 PM   Modules accepted: Orders

## 2017-08-19 NOTE — Telephone Encounter (Signed)
Spoke with pt and advised rx sent to pharmacy  

## 2017-09-15 ENCOUNTER — Encounter (HOSPITAL_BASED_OUTPATIENT_CLINIC_OR_DEPARTMENT_OTHER): Payer: Self-pay

## 2017-09-15 ENCOUNTER — Emergency Department (HOSPITAL_BASED_OUTPATIENT_CLINIC_OR_DEPARTMENT_OTHER)
Admission: EM | Admit: 2017-09-15 | Discharge: 2017-09-16 | Disposition: A | Discharge status: Home / Self Care | Payer: Medicare HMO | Attending: Emergency Medicine | Admitting: Emergency Medicine

## 2017-09-15 ENCOUNTER — Other Ambulatory Visit: Payer: Self-pay

## 2017-09-15 ENCOUNTER — Emergency Department (HOSPITAL_BASED_OUTPATIENT_CLINIC_OR_DEPARTMENT_OTHER): Payer: Medicare HMO

## 2017-09-15 DIAGNOSIS — R0602 Shortness of breath: Secondary | ICD-10-CM | POA: Insufficient documentation

## 2017-09-15 DIAGNOSIS — R0789 Other chest pain: Secondary | ICD-10-CM

## 2017-09-15 DIAGNOSIS — J45909 Unspecified asthma, uncomplicated: Secondary | ICD-10-CM | POA: Diagnosis not present

## 2017-09-15 DIAGNOSIS — R609 Edema, unspecified: Secondary | ICD-10-CM | POA: Insufficient documentation

## 2017-09-15 DIAGNOSIS — I10 Essential (primary) hypertension: Secondary | ICD-10-CM | POA: Insufficient documentation

## 2017-09-15 DIAGNOSIS — R079 Chest pain, unspecified: Secondary | ICD-10-CM | POA: Diagnosis present

## 2017-09-15 DIAGNOSIS — R600 Localized edema: Secondary | ICD-10-CM | POA: Diagnosis not present

## 2017-09-15 DIAGNOSIS — Z79899 Other long term (current) drug therapy: Secondary | ICD-10-CM | POA: Diagnosis not present

## 2017-09-15 LAB — CBC WITH DIFFERENTIAL/PLATELET
Basophils Absolute: 0.1 10*3/uL (ref 0.0–0.1)
Basophils Relative: 1 %
EOS ABS: 0.5 10*3/uL (ref 0.0–0.7)
Eosinophils Relative: 4 %
HCT: 39.5 % (ref 36.0–46.0)
HEMOGLOBIN: 12.9 g/dL (ref 12.0–15.0)
LYMPHS ABS: 2.2 10*3/uL (ref 0.7–4.0)
LYMPHS PCT: 20 %
MCH: 30 pg (ref 26.0–34.0)
MCHC: 32.7 g/dL (ref 30.0–36.0)
MCV: 91.9 fL (ref 78.0–100.0)
Monocytes Absolute: 1 10*3/uL (ref 0.1–1.0)
Monocytes Relative: 9 %
NEUTROS ABS: 7.3 10*3/uL (ref 1.7–7.7)
NEUTROS PCT: 66 %
Platelets: 290 10*3/uL (ref 150–400)
RBC: 4.3 MIL/uL (ref 3.87–5.11)
RDW: 13.5 % (ref 11.5–15.5)
WBC: 11 10*3/uL — AB (ref 4.0–10.5)

## 2017-09-15 LAB — BASIC METABOLIC PANEL
Anion gap: 9 (ref 5–15)
BUN: 18 mg/dL (ref 6–20)
CHLORIDE: 103 mmol/L (ref 101–111)
CO2: 27 mmol/L (ref 22–32)
Calcium: 9 mg/dL (ref 8.9–10.3)
Creatinine, Ser: 1 mg/dL (ref 0.44–1.00)
GFR calc non Af Amer: 56 mL/min — ABNORMAL LOW (ref >60)
Glucose, Bld: 95 mg/dL (ref 65–99)
POTASSIUM: 3.8 mmol/L (ref 3.5–5.1)
SODIUM: 139 mmol/L (ref 135–145)

## 2017-09-15 MED ORDER — ASPIRIN 81 MG PO CHEW
324.0000 mg | CHEWABLE_TABLET | Freq: Once | ORAL | Status: AC
  Administered 2017-09-15: 324 mg via ORAL
  Filled 2017-09-15: qty 4

## 2017-09-15 NOTE — ED Notes (Signed)
Patient transported to X-ray 

## 2017-09-15 NOTE — ED Triage Notes (Signed)
Pt reports HTN 211/106 at home PTA, associated lightheadedness all day, shortness of breath, chest pressure, and BLE edema. States known leaking valve under observation diagnosed a few months ago.

## 2017-09-15 NOTE — ED Provider Notes (Signed)
[place] [facility] EMERGENCY DEPARTMENT Provider Note   CSN: [id number] Arrival date & time: 09/15/17  2252     History   Chief Complaint Chief Complaint  Patient presents with  . Chest Pain    HPI Anne Barnes is a 71 y.o. female.  The history is provided by the patient.  She has a history of hypertension and GERD as well as environmental allergies.  She has felt lightheaded throughout the day.  Lightheadedness is worse when she stands up.  She did not she denies vertigo and denies nausea.  She has had some episodes of tightness in her chest.  These are not related to body position or exertion.  Episodes have lasted as long as 30 minutes.  She denies diaphoresis.  There has been very slight dyspnea which is not unusual for her because of her environmental allergies she also checked her blood pressure she did notice that her legs were swollen when she got up this evening.  She checked her blood pressure at home and noted at that point is very high-211/105.  Blood pressure at home usually runs 135-140/80-85.  She denies tobacco use, diabetes, hyperlipidemia, family history of premature coronary atherosclerosis.  Past Medical History:  Diagnosis Date  . ALLERGIC RHINITIS   . Allergic urticaria 08/01/2014   Patient reports allergies to shrimp, egg whites, tomatoes, dairy   . Bradycardia 02/25/2017  . Chiari malformation    Noted MRI brain 09/2013 - s/p neuro eval for same  . DIVERTICULOSIS, COLON   . Frequent headaches   . GERD   . H/O measles   . H/O mumps   . Hearing loss 01/18/2017  . History of chicken pox   . HYPERTENSION   . Hypocalcemia 02/25/2017  . IBS (irritable bowel syndrome) 05/26/2015  . OBSTRUCTIVE SLEEP APNEA 12/2008 dx   noncompliant with CPAP qhs  . Preventative health care 06/11/2017  . Sciatica    right side  . Sinusitis 06/06/2017  . 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 Vaginitis 01/18/2017  . Vitamin D deficiency 06/11/2017    Patient Active Problem List   Diagnosis Date Noted  . Vitamin D deficiency 06/11/2017  . Preventative health care 06/11/2017  . Sinusitis 06/06/2017  . Hypocalcemia 02/25/2017  . Bradycardia 02/25/2017  . Vaginitis 01/18/2017  . Hearing loss 01/18/2017  . Hyperglycemia 03/05/2016  . 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  . Hyperlipidemia, mild 05/26/2015  . Female bladder prolapse 05/26/2015  . IBS (irritable bowel syndrome) 05/26/2015  . History of chicken pox   . Allergic urticaria 08/01/2014  . Food allergy 08/01/2014  . Tinnitus 03/14/2014  . Osteoarthritis   . Nonspecific abnormal electrocardiogram (ECG) (EKG)   . Headache 10/21/2013  . Chiari malformation   . GERD 11/10/2010  . Obstructive sleep apnea 08/28/2010  . Essential hypertension 08/28/2010  . Seasonal and perennial allergic rhinitis 08/28/2010  . DIVERTICULOSIS, COLON 08/28/2010    Past Surgical History:  Procedure Laterality Date  . ABDOMINAL HYSTERECTOMY  1986  . BREAST SURGERY  1973   Breast biopsy  . UMBILICAL HERNIA REPAIR      OB History    No data available       Home Medications    Prior to Admission medications   Medication Sig Start Date End Date Taking? Authorizing Provider  albuterol (PROVENTIL HFA;VENTOLIN HFA) 108 (90 Base) MCG/ACT inhaler Inhale 2 puffs into the lungs every 4 (four) hours as needed for wheezing or shortness of breath. 08/19/17  Yes Young,  Kasandra Knudsen, MD  amLODipine (NORVASC) 2.5 MG tablet Take 1 tablet (2.5 mg total) by mouth daily. 07/25/17 10/23/17 Yes Dorothy Spark, MD  b complex vitamins tablet Take 1 tablet by mouth daily.   Yes [provider]  cetirizine (ZYRTEC) 10 MG tablet Take 1 tablet (10 mg total) by mouth daily. 07/15/13  Yes Rowe Clack, MD  Cholecalciferol (VITAMIN D) 2000 UNITS CAPS Take by mouth daily.    Yes [provider]  furosemide (LASIX) 40 MG tablet Take 1 tablet (40 mg total) by mouth daily. 07/25/17 10/23/17 Yes Dorothy Spark, MD  ipratropium  (ATROVENT) 0.06 % nasal spray 1-2 puffs each nostril twice daily as needed 08/19/17  Yes Young, Tarri Fuller D, MD  losartan (COZAAR) 100 MG tablet Take 1 tablet (100 mg total) by mouth daily. 06/06/17  Yes Mosie Lukes, MD  Multiple Vitamin (MULTIVITAMIN) tablet Take 1 tablet by mouth daily.     Yes [provider]  Omega 3-6-9 Fatty Acids (OMEGA 3-6-9 COMPLEX PO) Take 1 capsule by mouth daily.     Yes [provider]  omeprazole (PRILOSEC) 20 MG capsule Take 1 capsule (20 mg total) by mouth daily. 01/22/17  Yes Mosie Lukes, MD    Family History Family History  Problem Relation Age of Onset  . Arthritis Mother        died of complications from hip surgery  . Arthritis Father   . Kidney disease Father   . Pneumonia Father   . Heart disease Father   . Lupus Daughter   . Rheum arthritis Daughter   . Sjogren's syndrome Daughter   . Fibromyalgia Daughter   . Diabetes Brother   . Heart disease Brother   . Heart attack Brother   . Alcohol abuse Brother   . Throat cancer Brother   . Lung cancer Brother   . Kidney disease Sister        dialysis  . Obesity Sister   . Hypertension Maternal Grandfather   . Hypertension Sister   . Kidney disease Sister        dialysis  . Pneumonia Brother   . Hypertension Daughter   . Graves' disease Daughter   . Hypertension Daughter   . Alcohol abuse Other        parent  . Ataxia Neg Hx   . Chorea Neg Hx   . Dementia Neg Hx   . Mental retardation Neg Hx   . Migraines Neg Hx   . Multiple sclerosis Neg Hx   . Neurofibromatosis Neg Hx   . Neuropathy Neg Hx   . Parkinsonism Neg Hx   . Seizures Neg Hx   . Stroke Neg Hx     Social History Social History   Tobacco Use  . Smoking status: Never Smoker  . Smokeless tobacco: Never Used  . Tobacco comment: Married, lives with spouse, dtr 2 g-kids.  Substance Use Topics  . Alcohol use: No    Comment: rare  . Drug use: No     Allergies   Codeine;  Lisinopril-hydrochlorothiazide; Metoprolol; and Other   Review of Systems Review of Systems  All other systems reviewed and are negative.    Physical Exam Updated Vital Signs BP (!) 156/67 (BP Location: Left Arm)   Pulse 60   Temp 97.8 F (36.6 C) (Oral)   Resp 20   SpO2 100%   Physical Exam  Nursing note and vitals reviewed.  Obese 71 year old female, resting  comfortably and in no acute distress. Vital signs are significant for hypertension. Oxygen saturation is 100%, which is normal. Head is normocephalic and atraumatic. PERRLA, EOMI. Oropharynx is clear. Neck is nontender and supple without adenopathy or JVD. Back is nontender and there is no CVA tenderness. Lungs are clear without rales, wheezes, or rhonchi. Chest is mildly tender in the midsternal area. Heart has regular rate and rhythm without murmur. Abdomen is soft, flat, nontender without masses or hepatosplenomegaly and peristalsis is normoactive. Extremities have 1-2+ pretibial and pedal edema, full range of motion is present. Skin is warm and dry without rash. Neurologic: Mental status is normal, cranial nerves are intact, there are no motor or sensory deficits.  There is no nystagmus.  Dizziness is not reproduced by passive head movement.  ED Treatments / Results  Labs (all labs ordered are listed, but only abnormal results are displayed) Labs Reviewed  BASIC METABOLIC PANEL - Abnormal; Notable for the following components:      Result Value   GFR calc non Af Amer 56 (*)    All other components within normal limits  CBC WITH DIFFERENTIAL/PLATELET - Abnormal; Notable for the following components:   WBC 11.0 (*)    All other components within normal limits  TROPONIN I  BRAIN NATRIURETIC PEPTIDE  TROPONIN I    EKG  EKG Interpretation  Date/Time:  Sunday September 15 2017 22:59:30 EST Ventricular Rate:  85 PR Interval:    QRS Duration: 88 QT Interval:  391 QTC Calculation: 465 R Axis:   36 Text  Interpretation:  Sinus rhythm Atrial premature complex Baseline wander in lead(s) V6 When compared with ECG of 02/19/2017, Premature atrial complexes are now present Confirmed by Delora Fuel ([id number]) on 09/15/2017 11:05:12 PM       Radiology Dg Chest 2 View  Result Date: 09/16/2017 CLINICAL DATA:  71 year old female with chest pressure. EXAM: CHEST  2 VIEW COMPARISON:  Chest radiograph dated 01/10/2015 FINDINGS: The lungs are clear. There is no pleural effusion or pneumothorax. Stable borderline cardiomegaly. No acute osseous pathology. IMPRESSION: No active cardiopulmonary disease. Electronically Signed   By: Anner Crete M.D.   On: 09/16/2017 00:11    Procedures Procedures (including critical care time)  Medications Ordered in ED Medications - No data to display   Initial Impression / Assessment and Plan / ED Course  I have reviewed the triage vital signs and the nursing notes.  Pertinent labs & imaging results that were available during my care of the patient were reviewed by me and considered in my medical decision making (see chart for details).  Orthostatic lightheadedness of uncertain cause.  Will check orthostatic vital signs.  Chest discomfort of uncertain cause.  ECG is unchanged from prior.  Heart score is 3, which puts her at low risk for major adverse cardiac events in the next 30 days.  Screening labs have been obtained including troponin and BNP.  Old records are reviewed, and she has no relevant past visits.  Lab work is unremarkable.  Delta troponin is negative.  She is felt to be safe for discharge at this point.  Because of her chest discomfort is not clear.  She has a cardiologist with whom she can follow-up.  She is advised to increase her furosemide from 40 mg a day to 80 mg a day for the next 3 days.  Advised to continue to stay on a low-salt diet.  Return precautions discussed.  Final Clinical Impressions(s) / ED Diagnoses   Final  diagnoses:  Chest discomfort   Peripheral edema    ED Discharge Orders    None       Delora Fuel, MD 97/35/32 [id number]

## 2017-09-16 LAB — TROPONIN I: Troponin I: <0.03 ng/mL (ref <0.03)

## 2017-09-16 LAB — BRAIN NATRIURETIC PEPTIDE: B NATRIURETIC PEPTIDE 5: 43.6 pg/mL (ref 0.0–100.0)

## 2017-09-16 NOTE — ED Notes (Signed)
Patient stated today she was feeling dizzy at times esp when she would stand up.  No SOB reported but did notice bilateral lower leg edema.

## 2017-09-16 NOTE — ED Notes (Signed)
Pt ambulated to bathroom without difficulty. Family member given something to drink.

## 2017-09-16 NOTE — Discharge Instructions (Signed)
Stay on a low salt diet. Take two furosemide (Lasix) tablets every morning for the next three days, then resume taking one tablet every day. Return if symptoms are getting worse.

## 2017-09-16 NOTE — ED Notes (Signed)
Pt discharged to home with family. NAD.  

## 2017-09-30 ENCOUNTER — Other Ambulatory Visit (INDEPENDENT_AMBULATORY_CARE_PROVIDER_SITE_OTHER): Payer: Medicare HMO

## 2017-09-30 DIAGNOSIS — I10 Essential (primary) hypertension: Secondary | ICD-10-CM

## 2017-09-30 DIAGNOSIS — R739 Hyperglycemia, unspecified: Secondary | ICD-10-CM

## 2017-09-30 DIAGNOSIS — E785 Hyperlipidemia, unspecified: Secondary | ICD-10-CM | POA: Diagnosis not present

## 2017-09-30 DIAGNOSIS — E559 Vitamin D deficiency, unspecified: Secondary | ICD-10-CM | POA: Diagnosis not present

## 2017-09-30 LAB — CBC
HCT: 40.4 % (ref 36.0–46.0)
HEMOGLOBIN: 13.2 g/dL (ref 12.0–15.0)
MCHC: 32.6 g/dL (ref 30.0–36.0)
MCV: 90.7 fl (ref 78.0–100.0)
Platelets: 275 10*3/uL (ref 150.0–400.0)
RBC: 4.46 Mil/uL (ref 3.87–5.11)
RDW: 13.9 % (ref 11.5–15.5)
WBC: 10.1 10*3/uL (ref 4.0–10.5)

## 2017-09-30 LAB — COMPREHENSIVE METABOLIC PANEL
ALBUMIN: 4.3 g/dL (ref 3.5–5.2)
ALK PHOS: 84 U/L (ref 39–117)
ALT: 16 U/L (ref 0–35)
AST: 15 U/L (ref 0–37)
BUN: 17 mg/dL (ref 6–23)
CALCIUM: 9.2 mg/dL (ref 8.4–10.5)
CO2: 30 mEq/L (ref 19–32)
Chloride: 98 mEq/L (ref 96–112)
Creatinine, Ser: 0.86 mg/dL (ref 0.40–1.20)
GFR: 83.88 mL/min (ref >60.00)
Glucose, Bld: 94 mg/dL (ref 70–99)
POTASSIUM: 3.6 meq/L (ref 3.5–5.1)
SODIUM: 136 meq/L (ref 135–145)
TOTAL PROTEIN: 7.8 g/dL (ref 6.0–8.3)
Total Bilirubin: 0.6 mg/dL (ref 0.2–1.2)

## 2017-09-30 LAB — TSH: TSH: 1.88 u[IU]/mL (ref 0.35–4.50)

## 2017-09-30 LAB — HEMOGLOBIN A1C: Hgb A1c MFr Bld: 5.6 % (ref 4.6–6.5)

## 2017-09-30 LAB — VITAMIN D 25 HYDROXY (VIT D DEFICIENCY, FRACTURES): VITD: 24.24 ng/mL — AB (ref 30.00–100.00)

## 2017-09-30 LAB — LIPID PANEL
CHOLESTEROL: 197 mg/dL (ref 0–200)
HDL: 43 mg/dL (ref >39.00)
LDL Cholesterol: 143 mg/dL — ABNORMAL HIGH (ref 0–99)
NonHDL: 154.42
TRIGLYCERIDES: 56 mg/dL (ref 0.0–149.0)
Total CHOL/HDL Ratio: 5
VLDL: 11.2 mg/dL (ref 0.0–40.0)

## 2017-10-08 ENCOUNTER — Ambulatory Visit (INDEPENDENT_AMBULATORY_CARE_PROVIDER_SITE_OTHER): Payer: Medicare HMO | Admitting: Family Medicine

## 2017-10-08 ENCOUNTER — Encounter: Payer: Self-pay | Admitting: Family Medicine

## 2017-10-08 VITALS — BP 140/76 | HR 64 | Temp 98.1°F | Resp 18 | Wt 228.0 lb

## 2017-10-08 DIAGNOSIS — K589 Irritable bowel syndrome without diarrhea: Secondary | ICD-10-CM

## 2017-10-08 DIAGNOSIS — E559 Vitamin D deficiency, unspecified: Secondary | ICD-10-CM | POA: Diagnosis not present

## 2017-10-08 DIAGNOSIS — J302 Other seasonal allergic rhinitis: Secondary | ICD-10-CM | POA: Diagnosis not present

## 2017-10-08 DIAGNOSIS — E785 Hyperlipidemia, unspecified: Secondary | ICD-10-CM | POA: Diagnosis not present

## 2017-10-08 DIAGNOSIS — I10 Essential (primary) hypertension: Secondary | ICD-10-CM

## 2017-10-08 DIAGNOSIS — J3089 Other allergic rhinitis: Secondary | ICD-10-CM

## 2017-10-08 DIAGNOSIS — R739 Hyperglycemia, unspecified: Secondary | ICD-10-CM | POA: Diagnosis not present

## 2017-10-08 MED ORDER — VITAMIN D (ERGOCALCIFEROL) 1.25 MG (50000 UNIT) PO CAPS: 50000.0000 [IU] | ORAL_CAPSULE | ORAL | 1 refills | Status: DC

## 2017-10-08 NOTE — Assessment & Plan Note (Signed)
Improved with dietary changes, decreased meat and increased veg

## 2017-10-08 NOTE — Assessment & Plan Note (Addendum)
hgba1c acceptable, minimize simple carbs. Increase exercise as tolerated.  

## 2017-10-08 NOTE — Patient Instructions (Addendum)
Shingrix is the new shingles 2 shots over 2-6 months. At pharmacy  Hypertension Hypertension is another name for high blood pressure. High blood pressure forces your heart to work harder to pump blood. This can cause problems over time. There are two numbers in a blood pressure reading. There is a top number (systolic) over a bottom number (diastolic). It is best to have a blood pressure below 120/80. Healthy choices can help lower your blood pressure. You may need medicine to help lower your blood pressure if:  Your blood pressure cannot be lowered with healthy choices.  Your blood pressure is higher than 130/80.  Follow these instructions at home: Eating and drinking  If directed, follow the DASH eating plan. This diet includes: ? Filling half of your plate at each meal with fruits and vegetables. ? Filling one quarter of your plate at each meal with whole grains. Whole grains include whole wheat pasta, brown rice, and whole grain bread. ? Eating or drinking low-fat dairy products, such as skim milk or low-fat yogurt. ? Filling one quarter of your plate at each meal with low-fat (lean) proteins. Low-fat proteins include fish, skinless chicken, eggs, beans, and tofu. ? Avoiding fatty meat, cured and processed meat, or chicken with skin. ? Avoiding premade or processed food.  Eat less than 1,500 mg of salt (sodium) a day.  Limit alcohol use to no more than 1 drink a day for nonpregnant women and 2 drinks a day for men. One drink equals 12 oz of beer, 5 oz of wine, or 1 oz of hard liquor. Lifestyle  Work with your doctor to stay at a healthy weight or to lose weight. Ask your doctor what the best weight is for you.  Get at least 30 minutes of exercise that causes your heart to beat faster (aerobic exercise) most days of the week. This may include walking, swimming, or biking.  Get at least 30 minutes of exercise that strengthens your muscles (resistance exercise) at least 3 days a week.  This may include lifting weights or pilates.  Do not use any products that contain nicotine or tobacco. This includes cigarettes and e-cigarettes. If you need help quitting, ask your doctor.  Check your blood pressure at home as told by your doctor.  Keep all follow-up visits as told by your doctor. This is important. Medicines  Take over-the-counter and prescription medicines only as told by your doctor. Follow directions carefully.  Do not skip doses of blood pressure medicine. The medicine does not work as well if you skip doses. Skipping doses also puts you at risk for problems.  Ask your doctor about side effects or reactions to medicines that you should watch for. Contact a doctor if:  You think you are having a reaction to the medicine you are taking.  You have headaches that keep coming back (recurring).  You feel dizzy.  You have swelling in your ankles.  You have trouble with your vision. Get help right away if:  You get a very bad headache.  You start to feel confused.  You feel weak or numb.  You feel faint.  You get very bad pain in your: ? Chest. ? Belly (abdomen).  You throw up (vomit) more than once.  You have trouble breathing. Summary  Hypertension is another name for high blood pressure.  Making healthy choices can help lower blood pressure. If your blood pressure cannot be controlled with healthy choices, you may need to take medicine. This information  This information is not intended to replace advice given to you by your health care provider. Make sure you discuss any questions you have with your health care provider. Document Released: 02/27/2008 Document Revised: 08/08/2016 Document Reviewed: 08/08/2016 Elsevier Interactive Patient Education  2018 [employer]  

## 2017-10-08 NOTE — Assessment & Plan Note (Signed)
Encouraged heart healthy diet, increase exercise, avoid trans fats, consider a krill oil cap daily 

## 2017-10-08 NOTE — Assessment & Plan Note (Addendum)
Continue supplements and monitor. Taking vitamin D 2000 IU at least 3 days a week. Increase to daily and add the 50000 IU weekly

## 2017-10-08 NOTE — Assessment & Plan Note (Signed)
Notes decreased mucus and congestion since decreasing meats, dairy and gluten in diet

## 2017-10-08 NOTE — Progress Notes (Signed)
Subjective:  I acted as a Education administrator for Dr. Charlett Blake. [place], [place]  Patient ID: Anne Barnes, female    DOB: 1946-07-22, 72 y.o.   MRN: [id number]  No chief complaint on file.   HPI  Patient is in today for a 4 month follow up and she feels well today. She has recently changed her diet to no gluten, minimal carbs and dairy, meat only twice a week and she feels much better. Less pain, fatigue and brain fog. Feels much better. Denies CP/palp/SOB/HA/congestion/fevers/GI or GU c/o. Taking meds as prescribed. No polyuria or polydipsia  Patient Care Team: Mosie Lukes, MD as PCP - General (Family Medicine) Juanita Craver, MD (Gastroenterology) Deneise Lever, MD as Consulting Physician (Pulmonary Disease)   Past Medical History:  Diagnosis Date  . ALLERGIC RHINITIS   . Allergic urticaria 08/01/2014   Patient reports allergies to shrimp, egg whites, tomatoes, dairy   . Bradycardia 02/25/2017  . Chiari malformation    Noted MRI brain 09/2013 - s/p neuro eval for same  . DIVERTICULOSIS, COLON   . Frequent headaches   . GERD   . H/O measles   . H/O mumps   . Hearing loss 01/18/2017  . History of chicken pox   . HYPERTENSION   . Hypocalcemia 02/25/2017  . IBS (irritable bowel syndrome) 05/26/2015  . OBSTRUCTIVE SLEEP APNEA 12/2008 dx   noncompliant with CPAP qhs  . Preventative health care 06/11/2017  . Sciatica    right side  . Sinusitis 06/06/2017  . 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 Vaginitis 01/18/2017  . Vitamin D deficiency 06/11/2017    Past Surgical History:  Procedure Laterality Date  . ABDOMINAL HYSTERECTOMY  1986  . BREAST SURGERY  1973   Breast biopsy  . UMBILICAL HERNIA REPAIR      Family History  Problem Relation Age of Onset  . Arthritis Mother        died of complications from hip surgery  . Arthritis Father   . Kidney disease Father   . Pneumonia Father   . Heart disease Father   . Lupus Daughter   . Rheum arthritis Daughter   . Sjogren's syndrome Daughter     . Fibromyalgia Daughter   . Diabetes Brother   . Heart disease Brother   . Heart attack Brother   . Alcohol abuse Brother   . Throat cancer Brother   . Lung cancer Brother   . Kidney disease Sister        dialysis  . Obesity Sister   . Hypertension Maternal Grandfather   . Hypertension Sister   . Kidney disease Sister        dialysis  . Pneumonia Brother   . Hypertension Daughter   . Graves' disease Daughter   . Hypertension Daughter   . Alcohol abuse Other        parent  . Ataxia Neg Hx   . Chorea Neg Hx   . Dementia Neg Hx   . Mental retardation Neg Hx   . Migraines Neg Hx   . Multiple sclerosis Neg Hx   . Neurofibromatosis Neg Hx   . Neuropathy Neg Hx   . Parkinsonism Neg Hx   . Seizures Neg Hx   . Stroke Neg Hx     Social History   Socioeconomic History  . Marital status: Married    Spouse name: Not on file  . Number of children: Not on file  . Years of education: Not  on file  . Highest education level: Not on file  Social Needs  . Financial resource strain: Not on file  . Food insecurity - worry: Not on file  . Food insecurity - inability: Not on file  . Transportation needs - medical: Not on file  . Transportation needs - non-medical: Not on file  Occupational History  . Occupation: retired  Tobacco Use  . Smoking status: Never Smoker  . Smokeless tobacco: Never Used  . Tobacco comment: Married, lives with spouse, dtr 2 g-kids.  Substance and Sexual Activity  . Alcohol use: No    Comment: rare  . Drug use: No  . Sexual activity: Not on file    Comment: lives with husband, no dietary restrictions avoid foods allergic to, retired  Other Topics Concern  . Not on file  Social History Narrative  . Not on file    Outpatient Medications Prior to Visit  Medication Sig Dispense Refill  . albuterol (PROVENTIL HFA;VENTOLIN HFA) 108 (90 Base) MCG/ACT inhaler Inhale 2 puffs into the lungs every 4 (four) hours as needed for wheezing or shortness of breath.  1 Inhaler 2  . amLODipine (NORVASC) 2.5 MG tablet Take 1 tablet (2.5 mg total) by mouth daily. 90 tablet 3  . b complex vitamins tablet Take 1 tablet by mouth daily.    . cetirizine (ZYRTEC) 10 MG tablet Take 1 tablet (10 mg total) by mouth daily. 30 tablet 11  . Cholecalciferol (VITAMIN D) 2000 UNITS CAPS Take by mouth daily.     . furosemide (LASIX) 40 MG tablet Take 1 tablet (40 mg total) by mouth daily. 90 tablet 3  . ipratropium (ATROVENT) 0.06 % nasal spray 1-2 puffs each nostril twice daily as needed 15 mL 2  . losartan (COZAAR) 100 MG tablet Take 1 tablet (100 mg total) by mouth daily. 90 tablet 1  . Multiple Vitamin (MULTIVITAMIN) tablet Take 1 tablet by mouth daily.      . Omega 3-6-9 Fatty Acids (OMEGA 3-6-9 COMPLEX PO) Take 1 capsule by mouth daily.      Marland Kitchen omeprazole (PRILOSEC) 20 MG capsule Take 1 capsule (20 mg total) by mouth daily. 90 capsule 3   No facility-administered medications prior to visit.     Allergies  Allergen Reactions  . Codeine Nausea And Vomiting  . Lisinopril-Hydrochlorothiazide Hives    REACTION: lip swelling,facial swelling,rash.  . Metoprolol Other (See Comments)    Does not feel well on it   . Other     Patient states she is allergic to Shrimp, egg whites, tomatoes and dairy products.  Says they make her feel unwell but do not cause hives, SOB or anaphylaxis.  She just avoids eating them because they can make her have a headache.    Review of Systems  Constitutional: Positive for malaise/fatigue. Negative for fever.  HENT: Negative for congestion.   Eyes: Negative for blurred vision.  Respiratory: Negative for shortness of breath.   Cardiovascular: Negative for chest pain, palpitations and leg swelling.  Gastrointestinal: Negative for abdominal pain, blood in stool and nausea.  Genitourinary: Negative for dysuria and frequency.  Musculoskeletal: Negative for falls.  Skin: Negative for rash.  Neurological: Negative for dizziness, loss of  consciousness and headaches.  Endo/Heme/Allergies: Negative for environmental allergies.  Psychiatric/Behavioral: Negative for depression. The patient is not nervous/anxious.        Objective:    Physical Exam   Gen: NCAT, WNWD AA female, NAD HEENT: PERRLA, MMM, neck supple CV: RRR  no murmur Lungs: CTA B/L Abd: NTND, positive BS throughout Estr: no C/C/E Neuro: A & O x 3  BP 140/76 (BP Location: Left Arm, Patient Position: Sitting, Cuff Size: Normal)   Pulse 64   Temp 98.1 F (36.7 C) (Oral)   Resp 18   Wt 228 lb (103.4 kg)   SpO2 93%   BMI 46.05 kg/m  Wt Readings from Last 3 Encounters:  10/08/17 228 lb (103.4 kg)  07/25/17 226 lb (102.5 kg)  06/24/17 229 lb (103.9 kg)   BP Readings from Last 3 Encounters:  10/08/17 140/76  09/16/17 (!) 156/67  07/25/17 124/64     Immunization History  Administered Date(s) Administered  . Influenza, High Dose Seasonal PF 07/15/2013  . Influenza,inj,Quad PF,6+ Mos 08/29/2015  . Pneumococcal Conjugate-13 11/30/2015  . Pneumococcal Polysaccharide-23 02/26/2013  . Td 09/25/2003  . Tdap 08/29/2015    Health Maintenance  Topic Date Due  . INFLUENZA VACCINE  06/10/2018 (Originally 04/24/2017)  . MAMMOGRAM  11/21/2017  . COLONOSCOPY  10/22/2019  . TETANUS/TDAP  08/28/2025  . DEXA SCAN  Completed  . Hepatitis C Screening  Completed  . PNA vac Low Risk Adult  Completed    Lab Results  Component Value Date   WBC 10.1 09/30/2017   HGB 13.2 09/30/2017   HCT 40.4 09/30/2017   PLT 275.0 09/30/2017   GLUCOSE 94 09/30/2017   CHOL 197 09/30/2017   TRIG 56.0 09/30/2017   HDL 43.00 09/30/2017   LDLCALC 143 (H) 09/30/2017   ALT 16 09/30/2017   AST 15 09/30/2017   NA 136 09/30/2017   K 3.6 09/30/2017   CL 98 09/30/2017   CREATININE 0.86 09/30/2017   BUN 17 09/30/2017   CO2 30 09/30/2017   TSH 1.88 09/30/2017   INR 1.06 01/10/2015   HGBA1C 5.6 09/30/2017    Lab Results  Component Value Date   TSH 1.88 09/30/2017   Lab  Results  Component Value Date   WBC 10.1 09/30/2017   HGB 13.2 09/30/2017   HCT 40.4 09/30/2017   MCV 90.7 09/30/2017   PLT 275.0 09/30/2017   Lab Results  Component Value Date   NA 136 09/30/2017   K 3.6 09/30/2017   CO2 30 09/30/2017   GLUCOSE 94 09/30/2017   BUN 17 09/30/2017   CREATININE 0.86 09/30/2017   BILITOT 0.6 09/30/2017   ALKPHOS 84 09/30/2017   AST 15 09/30/2017   ALT 16 09/30/2017   PROT 7.8 09/30/2017   ALBUMIN 4.3 09/30/2017   CALCIUM 9.2 09/30/2017   ANIONGAP 9 09/15/2017   GFR 83.88 09/30/2017   Lab Results  Component Value Date   CHOL 197 09/30/2017   Lab Results  Component Value Date   HDL 43.00 09/30/2017   Lab Results  Component Value Date   LDLCALC 143 (H) 09/30/2017   Lab Results  Component Value Date   TRIG 56.0 09/30/2017   Lab Results  Component Value Date   CHOLHDL 5 09/30/2017   Lab Results  Component Value Date   HGBA1C 5.6 09/30/2017         Assessment & Plan:   Problem List Items Addressed This Visit    Essential hypertension - Primary   Relevant Orders   CBC   Comprehensive metabolic panel   TSH   Seasonal and perennial allergic rhinitis    Notes decreased mucus and congestion since decreasing meats, dairy and gluten in diet      Hyperlipidemia, mild    Encouraged heart healthy  diet, increase exercise, avoid trans fats, consider a krill oil cap daily      Relevant Orders   Lipid panel   IBS (irritable bowel syndrome)    Improved with dietary changes, decreased meat and increased veg      Hyperglycemia    hgba1c acceptable, minimize simple carbs. Increase exercise as tolerated.       Relevant Orders   Hemoglobin A1c   Vitamin D deficiency    Continue supplements and monitor. Taking vitamin D 2000 IU at least 3 days a week. Increase to daily and add the 50000 IU weekly      Relevant Orders   VITAMIN D 25 Hydroxy (Vit-D Deficiency, Fractures)      I am having Iantha Fallen start on Vitamin D  (Ergocalciferol). I am also having her maintain her multivitamin, Vitamin D, Omega 3-6-9 Fatty Acids (OMEGA 3-6-9 COMPLEX PO), cetirizine, b complex vitamins, omeprazole, losartan, furosemide, amLODipine, albuterol, and ipratropium.  Meds ordered this encounter  Medications  . Vitamin D, Ergocalciferol, (DRISDOL) 50000 units CAPS capsule    Sig: Take 1 capsule (50,000 Units total) by mouth every 7 (seven) days.    Dispense:  12 capsule    Refill:  1    CMA served as scribe during this visit. History, Physical and Plan performed by medical provider. Documentation and orders reviewed and attested to.  Penni Homans, MD

## 2017-11-07 ENCOUNTER — Ambulatory Visit: Payer: Medicare HMO | Admitting: Internal Medicine

## 2017-11-07 ENCOUNTER — Encounter: Payer: Self-pay | Admitting: Internal Medicine

## 2017-11-07 VITALS — BP 122/80 | HR 79 | Ht 60.0 in | Wt 226.2 lb

## 2017-11-07 DIAGNOSIS — J4530 Mild persistent asthma, uncomplicated: Secondary | ICD-10-CM

## 2017-11-07 DIAGNOSIS — J4520 Mild intermittent asthma, uncomplicated: Secondary | ICD-10-CM

## 2017-11-07 DIAGNOSIS — G4733 Obstructive sleep apnea (adult) (pediatric): Secondary | ICD-10-CM

## 2017-11-07 MED ORDER — ALBUTEROL SULFATE HFA 108 (90 BASE) MCG/ACT IN AERS: INHALATION_SPRAY | RESPIRATORY_TRACT | 12 refills | Status: DC

## 2017-11-07 NOTE — Progress Notes (Signed)
HPI female never smoker followed for rhinitis/allergies, asthma, OSA/ failed CPAP Peanut and cat cause coughing and we advised her to avoid them Allergy profile 03/11/14- positive especially for dust, cat and dog Food allergy profile- total IgE 86 with several food group elevations Sed rate-WNL ---------------------------------------------------------------------------------------- 11/23/2015-72 year old female never smoker followed for rhinitis/allergies, asthma, OSA FOLLOWS FOR: Pt would like to discuss diet and certain things she feels affects her cough. Pt c/o cough and head pressure when coughing.  For the last 4 days she has been waking in the morning with frontal pressure headache-blames pollen. Otherwise little change in chronic watery nasal discharge with postnasal drip and associated mild cough. Admits some wheezing especially when she goes to the gym. Ipratropium nasal spray does help. Hasn't liked to use her albuterol rescue inhaler but indicates just doesn't want to feel she needs it. Asks prescription for replacement CPAP mask. Diagnosed OSA 10 years ago in [facility] by her family practice doctor who was no longer there. Supplies through [facility]. Tries to use CPAP most nights and clearly has felt it helped her.  11/07/17- 72 year old female never smoker followed for rhinitis/allergies, asthma, OSA ----Asthma and OSA: DME: [facility]. Pt states she has not used CPAP in a long time due to machine making her too dry. She took to [facility] and was told nothing was wrong with machine. Pt states she has been dx'd with leaking heart valve which causes SOB but allergies are fine-staying away from gluten.  CPAP [facility]- non-compliant Rescue inhaler about once a week Office Spirometry 11/07/17-WNL-FVC 1.58/82%, FEV1 1.27/85%, ratio 0.80, FEF 25-75% 0.26/89%  ROS-see HPI  + = positive Constitutional:   No-   weight loss, night sweats, fevers, chills, fatigue, lassitude. HEENT:   +headaches, difficulty  swallowing, tooth/dental problems, sore throat,       No-  sneezing, itching, ear ache, +nasal congestion, +post nasal drip,  CV:  No-   chest pain, orthopnea, PND, swelling in lower extremities, anasarca,  dizziness, palpitations Resp: +shortness of breath with exertion or at rest.              productive cough,  No non-productive cough,  No- coughing up of blood.              No-   change in color of mucus.  No- wheezing.   Skin: No-   rash or lesions. GI:  No-   heartburn, indigestion, abdominal pain, nausea, vomiting,  GU: . MS:  No-   joint pain or swelling.   Neuro-     nothing unusual Psych:  No- change in mood or affect. No depression or anxiety.  No memory loss.  OBJ- Physical Exam General- Alert, Oriented, Affect-appropriate, Distress- none acute, + obese Skin- rash-none, lesions- none, excoriation- none Lymphadenopathy- none Head- atraumatic            Eyes- Gross vision intact, PERRLA, conjunctivae and secretions clear            Ears- Hearing, canals-normal            Nose- + turbinate edema, no-Septal dev, mucus, polyps, erosion, perforation             Throat- Mallampati III-IV , mucosa clear , drainage- none, tonsils- atrophic, +Dentures Neck- flexible , trachea midline, no stridor , thyroid nl, carotid no bruit Chest - symmetrical excursion , unlabored           Heart/CV- RRR , no murmur , no gallop  , no rub, nl s1  s2                           - JVD- none , edema- none, stasis changes- none, varices- none           Lung- clear to P&A, wheeze-none, cough none, dullness-none, rub- none           Chest wall-  Abd- Br/ Gen/ Rectal- Not done, not indicated Extrem- cyanosis- none, clubbing, none, atrophy- none, strength- nl Neuro- grossly intact to observation

## 2017-11-07 NOTE — Patient Instructions (Signed)
Script sent for ProAir (albuterol) rescue inhaler     Inhale 2 puffs every 6 hours if needed  Order- office spirometry   Dx asthma mild intermittent  Order- please schedule unattended home sleep test    Dx OSA  Please call me for result and recommendation about 2 weeks after your sleep test. If appropriate, we can get you a new automatic CPAP machine

## 2017-11-08 NOTE — Assessment & Plan Note (Signed)
Pap but agrees to reassess.  She remains obese and it is unlikely that her condition has medically improved. Plan-sleep study.  Call for results anticipating we may restart CPAP.

## 2017-11-08 NOTE — Assessment & Plan Note (Signed)
Well-controlled now with no recent exacerbation, infrequent need for rescue inhaler and no associated sleep disturbance.

## 2017-12-05 DIAGNOSIS — G4733 Obstructive sleep apnea (adult) (pediatric): Secondary | ICD-10-CM | POA: Diagnosis not present

## 2017-12-06 ENCOUNTER — Other Ambulatory Visit: Payer: Self-pay | Admitting: *Deleted

## 2017-12-06 DIAGNOSIS — G4733 Obstructive sleep apnea (adult) (pediatric): Secondary | ICD-10-CM

## 2017-12-10 DIAGNOSIS — G4733 Obstructive sleep apnea (adult) (pediatric): Secondary | ICD-10-CM | POA: Diagnosis not present

## 2017-12-16 ENCOUNTER — Telehealth: Payer: Self-pay | Admitting: Family Medicine

## 2017-12-16 NOTE — Telephone Encounter (Signed)
Copied from [place] [id number]. Topic: Quick Communication - Rx Refill/Question >> Dec 16, 2017  2:24 PM Boyd Kerbs wrote:  Medication: losartan (COZAAR) 100 MG tablet - recalled   Has the patient contacted their pharmacy? Yes.    (Agent: If no, request that the patient contact the pharmacy for the refill.)  Preferred Pharmacy (with phone number or street name): [facility] ([address] [place] - [address] [place]) [place] [address] Phone: [phone] Fax: [phone]  Agent: Please be advised that RX refills may take up to 3 business days. We ask that you follow-up with your pharmacy.

## 2017-12-17 MED ORDER — LOSARTAN POTASSIUM 100 MG PO TABS: 100.0000 mg | ORAL_TABLET | Freq: Every day | ORAL | 1 refills | Status: DC

## 2017-12-17 NOTE — Telephone Encounter (Signed)
Losartan (Cozaar) refill Last OV: 06/06/17 and 10/08/17 Last Refill:06/06/17 Pharmacy:[facility] [address] PCP: Dr Charlett Blake

## 2017-12-26 ENCOUNTER — Telehealth: Payer: Self-pay | Admitting: Internal Medicine

## 2017-12-26 DIAGNOSIS — G4733 Obstructive sleep apnea (adult) (pediatric): Secondary | ICD-10-CM

## 2017-12-26 NOTE — Telephone Encounter (Signed)
ATC pt, no answer. Left message for pt to call back.    Notes recorded by Deneise Lever, MD on 12/20/2017 at 4:22 PM EDT Home sleep test- showed obstructive sleep apnea, averaging 12.3 apneas/ hour, with drops in blood oxygen level.  If she is willing to try CPAP again, recommend we order DME, new CPAP auto 5-20, mask of choice, humidifier, supplies, AirView  Please make sure she has a ROV in 31-90 days, per insurance regs.

## 2017-12-27 NOTE — Telephone Encounter (Signed)
Left voice mail on machine for patient to return phone call back regarding results. X2 

## 2017-12-30 NOTE — Telephone Encounter (Signed)
Pt is calling back [phone]  or [phone]

## 2017-12-30 NOTE — Telephone Encounter (Signed)
Spoke with patient. She is aware of HST results. She wishes to proceed with CPAP machine. Per patient, she does not have a preference in the DME company as long as it is local.   She has been scheduled with [facility] for 03/21/18 at 1030. She is aware of appt.   Order has been placed. Nothing else needed at time of call.

## 2018-01-02 ENCOUNTER — Other Ambulatory Visit (INDEPENDENT_AMBULATORY_CARE_PROVIDER_SITE_OTHER): Payer: Medicare HMO

## 2018-01-02 DIAGNOSIS — E785 Hyperlipidemia, unspecified: Secondary | ICD-10-CM | POA: Diagnosis not present

## 2018-01-02 DIAGNOSIS — I10 Essential (primary) hypertension: Secondary | ICD-10-CM

## 2018-01-02 DIAGNOSIS — E559 Vitamin D deficiency, unspecified: Secondary | ICD-10-CM | POA: Diagnosis not present

## 2018-01-02 DIAGNOSIS — R739 Hyperglycemia, unspecified: Secondary | ICD-10-CM | POA: Diagnosis not present

## 2018-01-02 LAB — COMPREHENSIVE METABOLIC PANEL
ALT: 11 U/L (ref 0–35)
AST: 15 U/L (ref 0–37)
Albumin: 3.9 g/dL (ref 3.5–5.2)
Alkaline Phosphatase: 78 U/L (ref 39–117)
BUN: 20 mg/dL (ref 6–23)
CHLORIDE: 103 meq/L (ref 96–112)
CO2: 31 meq/L (ref 19–32)
CREATININE: 0.82 mg/dL (ref 0.40–1.20)
Calcium: 9 mg/dL (ref 8.4–10.5)
GFR: 88.56 mL/min (ref >60.00)
GLUCOSE: 109 mg/dL — AB (ref 70–99)
Potassium: 4.1 mEq/L (ref 3.5–5.1)
SODIUM: 140 meq/L (ref 135–145)
Total Bilirubin: 0.5 mg/dL (ref 0.2–1.2)
Total Protein: 7.3 g/dL (ref 6.0–8.3)

## 2018-01-02 LAB — CBC
HEMATOCRIT: 38.4 % (ref 36.0–46.0)
Hemoglobin: 12.8 g/dL (ref 12.0–15.0)
MCHC: 33.4 g/dL (ref 30.0–36.0)
MCV: 90.3 fl (ref 78.0–100.0)
Platelets: 249 10*3/uL (ref 150.0–400.0)
RBC: 4.26 Mil/uL (ref 3.87–5.11)
RDW: 13.8 % (ref 11.5–15.5)
WBC: 8.4 10*3/uL (ref 4.0–10.5)

## 2018-01-02 LAB — LIPID PANEL
CHOLESTEROL: 166 mg/dL (ref 0–200)
HDL: 40.4 mg/dL (ref >39.00)
LDL CALC: 118 mg/dL — AB (ref 0–99)
NONHDL: 125.43
Total CHOL/HDL Ratio: 4
Triglycerides: 39 mg/dL (ref 0.0–149.0)
VLDL: 7.8 mg/dL (ref 0.0–40.0)

## 2018-01-02 LAB — TSH: TSH: 1.61 u[IU]/mL (ref 0.35–4.50)

## 2018-01-02 LAB — VITAMIN D 25 HYDROXY (VIT D DEFICIENCY, FRACTURES): VITD: 32.59 ng/mL (ref 30.00–100.00)

## 2018-01-02 LAB — HEMOGLOBIN A1C: HEMOGLOBIN A1C: 5.4 % (ref 4.6–6.5)

## 2018-01-09 ENCOUNTER — Ambulatory Visit (INDEPENDENT_AMBULATORY_CARE_PROVIDER_SITE_OTHER): Payer: Medicare HMO | Admitting: Family Medicine

## 2018-01-09 ENCOUNTER — Encounter: Payer: Self-pay | Admitting: Family Medicine

## 2018-01-09 VITALS — BP 172/84 | HR 88 | Temp 98.1°F | Resp 16 | Ht 60.0 in | Wt 226.6 lb

## 2018-01-09 DIAGNOSIS — R739 Hyperglycemia, unspecified: Secondary | ICD-10-CM

## 2018-01-09 DIAGNOSIS — I10 Essential (primary) hypertension: Secondary | ICD-10-CM | POA: Diagnosis not present

## 2018-01-09 DIAGNOSIS — K589 Irritable bowel syndrome without diarrhea: Secondary | ICD-10-CM

## 2018-01-09 DIAGNOSIS — E785 Hyperlipidemia, unspecified: Secondary | ICD-10-CM

## 2018-01-09 DIAGNOSIS — R001 Bradycardia, unspecified: Secondary | ICD-10-CM | POA: Diagnosis not present

## 2018-01-09 DIAGNOSIS — I5041 Acute combined systolic (congestive) and diastolic (congestive) heart failure: Secondary | ICD-10-CM | POA: Diagnosis not present

## 2018-01-09 DIAGNOSIS — E559 Vitamin D deficiency, unspecified: Secondary | ICD-10-CM

## 2018-01-09 DIAGNOSIS — R1013 Epigastric pain: Secondary | ICD-10-CM

## 2018-01-09 DIAGNOSIS — K219 Gastro-esophageal reflux disease without esophagitis: Secondary | ICD-10-CM

## 2018-01-09 DIAGNOSIS — G4733 Obstructive sleep apnea (adult) (pediatric): Secondary | ICD-10-CM | POA: Diagnosis not present

## 2018-01-09 DIAGNOSIS — E669 Obesity, unspecified: Secondary | ICD-10-CM | POA: Diagnosis not present

## 2018-01-09 LAB — H. PYLORI ANTIBODY, IGG: H Pylori IgG: NEGATIVE

## 2018-01-09 MED ORDER — RANITIDINE HCL 300 MG PO CAPS: 300.0000 mg | ORAL_CAPSULE | Freq: Every evening | ORAL | 3 refills | Status: DC

## 2018-01-09 NOTE — Assessment & Plan Note (Signed)
Take daily supplements, monitor

## 2018-01-09 NOTE — Patient Instructions (Addendum)
Add an extra 1/2 a tab of Lasix 40 mg daily for the next month to help edema and monitor bp come back in 1 month for a bp check and if still elevated with have to alter meds further   Can drop potassium so symptoms are palpitations and muscle cramps worsening then stop the extra of Lasix and let us know so you can come in for a CMP blood test  Heartburn Heartburn is a type of pain or discomfort that can happen in the throat or chest. It is often described as a burning pain. It may also cause a bad taste in the mouth. Heartburn may feel worse when you lie down or bend over, and it is often worse at night. Heartburn may be caused by stomach contents that move back up into the esophagus (reflux). Follow these instructions at home: Take these actions to decrease your discomfort and to help avoid complications. Diet  Follow a diet as recommended by your health care provider. This may involve avoiding foods and drinks such as: ? Coffee and tea (with or without caffeine). ? Drinks that contain alcohol. ? Energy drinks and sports drinks. ? Carbonated drinks or sodas. ? Chocolate and cocoa. ? Peppermint and mint flavorings. ? Garlic and onions. ? Horseradish. ? Spicy and acidic foods, including peppers, chili powder, curry powder, vinegar, hot sauces, and barbecue sauce. ? Citrus fruit juices and citrus fruits, such as oranges, lemons, and limes. ? Tomato-based foods, such as red sauce, chili, salsa, and pizza with red sauce. ? Fried and fatty foods, such as donuts, french fries, potato chips, and high-fat dressings. ? High-fat meats, such as hot dogs and fatty cuts of red and white meats, such as rib eye steak, sausage, ham, and bacon. ? High-fat dairy items, such as whole milk, butter, and cream cheese.  Eat small, frequent meals instead of large meals.  Avoid drinking large amounts of liquid with your meals.  Avoid eating meals during the 2-3 hours before bedtime.  Avoid lying down right  after you eat.  Do not exercise right after you eat. General instructions  Pay attention to any changes in your symptoms.  Take over-the-counter and prescription medicines only as told by your health care provider. Do not take aspirin, ibuprofen, or other NSAIDs unless your health care provider told you to do so.  Do not use any tobacco products, including cigarettes, chewing tobacco, and e-cigarettes. If you need help quitting, ask your health care provider.  Wear loose-fitting clothing. Do not wear anything tight around your waist that causes pressure on your abdomen.  Raise (elevate) the head of your bed about 6 inches (15 cm).  Try to reduce your stress, such as with yoga or meditation. If you need help reducing stress, ask your health care provider.  If you are overweight, reduce your weight to an amount that is healthy for you. Ask your health care provider for guidance about a safe weight loss goal.  Keep all follow-up visits as told by your health care provider. This is important. Contact a health care provider if:  You have new symptoms.  You have unexplained weight loss.  You have difficulty swallowing, or it hurts to swallow.  You have wheezing or a persistent cough.  Your symptoms do not improve with treatment.  You have frequent heartburn for more than two weeks. Get help right away if:  You have pain in your arms, neck, jaw, teeth, or back.  You feel sweaty, dizzy, or  light-headed.  You have chest pain or shortness of breath.  You vomit and your vomit looks like blood or coffee grounds.  Your stool is bloody or black. This information is not intended to replace advice given to you by your health care provider. Make sure you discuss any questions you have with your health care provider. Document Released: 01/27/2009 Document Revised: 02/16/2016 Document Reviewed: 01/05/2015 Elsevier Interactive Patient Education  [employer].

## 2018-01-09 NOTE — Assessment & Plan Note (Signed)
Encouraged heart healthy diet, increase exercise, avoid trans fats, consider a krill oil cap daily 

## 2018-01-09 NOTE — Assessment & Plan Note (Signed)
hgba1c acceptable, minimize simple carbs. Increase exercise as tolerated. Continue current meds 

## 2018-01-09 NOTE — Assessment & Plan Note (Addendum)
Not well controlled, no changes to meds she is in pain and very stressed today so will monitor her BP and notify us if it continues to be elevated. Recheck bp in 2-4 weeks Encouraged heart healthy diet such as the DASH diet and exercise as tolerated.

## 2018-01-09 NOTE — Progress Notes (Signed)
Subjective:  I acted as a Education administrator for Dr. Charlett Blake. [place], [place]  Patient ID: Anne Barnes, female    DOB: 09-17-1946, 72 y.o.   MRN: [id number]  Chief Complaint  Patient presents with  . Hypertension    Pt here for follow up  . Irritable Bowel Syndrome    Pt here for follow up  . Hyperglycemia    Pt here for follow up  . vitamin D deficiency    Pt here for follow up  . Stress    Pt reports increased stress with different family stiuations and recent losses. Would like contact # for counselors.    HPI  Patient is in today for 3 month follow up. Patient states she is stressed more that normal due to family and recent losses. Her daughter has been struggling with lupus and she has had several family members die recently. She is very stressed and tearful but does believe she will be alright. Notes some anhedonia but no suicidal ideation. Has been noting some increase in dyspepsia and abdominal swelling but denies any pain or black sticky stool. Denies CP/palp/SOB/HA/congestion/fevers/GI or GU c/o. Taking meds as prescribed  Patient Care Team: Mosie Lukes, MD as PCP - General (Family Medicine) Juanita Craver, MD (Gastroenterology) Deneise Lever, MD as Consulting Physician (Pulmonary Disease)   Past Medical History:  Diagnosis Date  . ALLERGIC RHINITIS   . Allergic urticaria 08/01/2014   Patient reports allergies to shrimp, egg whites, tomatoes, dairy   . Bradycardia 02/25/2017  . Chiari malformation    Noted MRI brain 09/2013 - s/p neuro eval for same  . DIVERTICULOSIS, COLON   . Frequent headaches   . GERD   . H/O measles   . H/O mumps   . Hearing loss 01/18/2017  . History of chicken pox   . HYPERTENSION   . Hypocalcemia 02/25/2017  . IBS (irritable bowel syndrome) 05/26/2015  . OBSTRUCTIVE SLEEP APNEA 12/2008 dx   noncompliant with CPAP qhs  . Preventative health care 06/11/2017  . Sciatica    right side  . Sinusitis 06/06/2017  . 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  Vaginitis 01/18/2017  . Vitamin D deficiency 06/11/2017    Past Surgical History:  Procedure Laterality Date  . ABDOMINAL HYSTERECTOMY  1986  . BREAST SURGERY  1973   Breast biopsy  . UMBILICAL HERNIA REPAIR      Family History  Problem Relation Age of Onset  . Arthritis Mother        died of complications from hip surgery  . Arthritis Father   . Kidney disease Father   . Pneumonia Father   . Heart disease Father   . Lupus Daughter   . Rheum arthritis Daughter   . Sjogren's syndrome Daughter   . Fibromyalgia Daughter   . Diabetes Brother   . Heart disease Brother   . Heart attack Brother   . Alcohol abuse Brother   . Throat cancer Brother   . Lung cancer Brother   . Kidney disease Sister        dialysis  . Obesity Sister   . Hypertension Maternal Grandfather   . Hypertension Sister   . Kidney disease Sister        dialysis  . Pneumonia Brother   . Hypertension Daughter   . Graves' disease Daughter   . Hypertension Daughter   . Alcohol abuse Other        parent  . Ataxia Neg Hx   .  Chorea Neg Hx   . Dementia Neg Hx   . Mental retardation Neg Hx   . Migraines Neg Hx   . Multiple sclerosis Neg Hx   . Neurofibromatosis Neg Hx   . Neuropathy Neg Hx   . Parkinsonism Neg Hx   . Seizures Neg Hx   . Stroke Neg Hx     Social History   Socioeconomic History  . Marital status: Married    Spouse name: Not on file  . Number of children: Not on file  . Years of education: Not on file  . Highest education level: Not on file  Occupational History  . Occupation: retired  Scientific laboratory technician  . Financial resource strain: Not on file  . Food insecurity:    Worry: Not on file    Inability: Not on file  . Transportation needs:    Medical: Not on file    Non-medical: Not on file  Tobacco Use  . Smoking status: Never Smoker  . Smokeless tobacco: Never Used  . Tobacco comment: Married, lives with spouse, dtr 2 g-kids.  Substance and Sexual Activity  . Alcohol use: No     Comment: rare  . Drug use: No  . Sexual activity: Not on file    Comment: lives with husband, no dietary restrictions avoid foods allergic to, retired  Lifestyle  . Physical activity:    Days per week: Not on file    Minutes per session: Not on file  . Stress: Not on file  Relationships  . Social connections:    Talks on phone: Not on file    Gets together: Not on file    Attends religious service: Not on file    Active member of club or organization: Not on file    Attends meetings of clubs or organizations: Not on file    Relationship status: Not on file  . Intimate partner violence:    Fear of current or ex partner: Not on file    Emotionally abused: Not on file    Physically abused: Not on file    Forced sexual activity: Not on file  Other Topics Concern  . Not on file  Social History Narrative  . Not on file    Outpatient Medications Prior to Visit  Medication Sig Dispense Refill  . albuterol (PROAIR HFA) 108 (90 Base) MCG/ACT inhaler Inhale 2 puffs every 6t hours as needed for breathing 1 Inhaler 12  . b complex vitamins tablet Take 1 tablet by mouth daily.    . cetirizine (ZYRTEC) 10 MG tablet Take 1 tablet (10 mg total) by mouth daily. 30 tablet 11  . Cholecalciferol (VITAMIN D) 2000 UNITS CAPS Take by mouth daily.     Marland Kitchen ipratropium (ATROVENT) 0.06 % nasal spray 1-2 puffs each nostril twice daily as needed 15 mL 2  . losartan (COZAAR) 100 MG tablet Take 1 tablet (100 mg total) by mouth daily. 90 tablet 1  . Multiple Vitamin (MULTIVITAMIN) tablet Take 1 tablet by mouth daily.      . Omega 3-6-9 Fatty Acids (OMEGA 3-6-9 COMPLEX PO) Take 1 capsule by mouth daily.      Marland Kitchen omeprazole (PRILOSEC) 20 MG capsule Take 1 capsule (20 mg total) by mouth daily. 90 capsule 3  . Vitamin D, Ergocalciferol, (DRISDOL) 50000 units CAPS capsule Take 1 capsule (50,000 Units total) by mouth every 7 (seven) days. 12 capsule 1  . amLODipine (NORVASC) 2.5 MG tablet Take 1 tablet (2.5 mg total)  by  mouth daily. 90 tablet 3  . furosemide (LASIX) 40 MG tablet Take 1 tablet (40 mg total) by mouth daily. 90 tablet 3   No facility-administered medications prior to visit.     Allergies  Allergen Reactions  . Codeine Nausea And Vomiting  . Lisinopril-Hydrochlorothiazide Hives    REACTION: lip swelling,facial swelling,rash.  . Metoprolol Other (See Comments)    Does not feel well on it   . Other     Patient states she is allergic to Shrimp, egg whites, tomatoes and dairy products.  Says they make her feel unwell but do not cause hives, SOB or anaphylaxis.  She just avoids eating them because they can make her have a headache.    Review of Systems  Constitutional: Negative for fever and malaise/fatigue.  HENT: Negative for congestion.   Eyes: Negative for blurred vision.  Respiratory: Negative for shortness of breath.   Cardiovascular: Negative for chest pain, palpitations and leg swelling.  Gastrointestinal: Positive for abdominal pain. Negative for blood in stool and nausea.  Genitourinary: Negative for dysuria and frequency.  Musculoskeletal: Negative for falls.  Skin: Negative for rash.  Neurological: Negative for dizziness, loss of consciousness and headaches.  Endo/Heme/Allergies: Negative for environmental allergies.  Psychiatric/Behavioral: Positive for depression. The patient is nervous/anxious.        Objective:    Physical Exam  Constitutional: She is oriented to person, place, and time. She appears well-developed and well-nourished. No distress.  HENT:  Head: Normocephalic and atraumatic.  Nose: Nose normal.  Eyes: Right eye exhibits no discharge. Left eye exhibits no discharge.  Neck: Normal range of motion. Neck supple.  Cardiovascular: Normal rate and regular rhythm.  No murmur heard. Pulmonary/Chest: Effort normal and breath sounds normal.  Abdominal: Soft. Bowel sounds are normal. There is no tenderness.  Musculoskeletal: She exhibits no edema.    Neurological: She is alert and oriented to person, place, and time.  Skin: Skin is warm and dry.  Psychiatric: She has a normal mood and affect.  Nursing note and vitals reviewed.   BP (!) 188/84 (BP Location: Left Arm, Cuff Size: Large)   Pulse 88   Temp 98.1 F (36.7 C) (Oral)   Resp 16   Ht 5' (1.524 m)   Wt 226 lb 9.6 oz (102.8 kg)   SpO2 98%   BMI 44.25 kg/m  Wt Readings from Last 3 Encounters:  01/09/18 226 lb 9.6 oz (102.8 kg)  11/07/17 226 lb 3.2 oz (102.6 kg)  10/08/17 228 lb (103.4 kg)   BP Readings from Last 3 Encounters:  01/09/18 (!) 188/84  11/07/17 122/80  10/08/17 140/76     Immunization History  Administered Date(s) Administered  . Influenza, High Dose Seasonal PF 07/15/2013  . Influenza,inj,Quad PF,6+ Mos 08/29/2015  . Pneumococcal Conjugate-13 11/30/2015  . Pneumococcal Polysaccharide-23 02/26/2013  . Td 09/25/2003  . Tdap 08/29/2015    Health Maintenance  Topic Date Due  . INFLUENZA VACCINE  07/10/2018 (Originally 04/24/2018)  . MAMMOGRAM  01/10/2019 (Originally 11/21/2017)  . COLONOSCOPY  10/22/2019  . TETANUS/TDAP  08/28/2025  . DEXA SCAN  Completed  . Hepatitis C Screening  Completed  . PNA vac Low Risk Adult  Completed    Lab Results  Component Value Date   WBC 8.4 01/02/2018   HGB 12.8 01/02/2018   HCT 38.4 01/02/2018   PLT 249.0 01/02/2018   GLUCOSE 109 (H) 01/02/2018   CHOL 166 01/02/2018   TRIG 39.0 01/02/2018   HDL 40.40 01/02/2018  LDLCALC 118 (H) 01/02/2018   ALT 11 01/02/2018   AST 15 01/02/2018   NA 140 01/02/2018   K 4.1 01/02/2018   CL 103 01/02/2018   CREATININE 0.82 01/02/2018   BUN 20 01/02/2018   CO2 31 01/02/2018   TSH 1.61 01/02/2018   INR 1.06 01/10/2015   HGBA1C 5.4 01/02/2018    Lab Results  Component Value Date   TSH 1.61 01/02/2018   Lab Results  Component Value Date   WBC 8.4 01/02/2018   HGB 12.8 01/02/2018   HCT 38.4 01/02/2018   MCV 90.3 01/02/2018   PLT 249.0 01/02/2018   Lab Results   Component Value Date   NA 140 01/02/2018   K 4.1 01/02/2018   CO2 31 01/02/2018   GLUCOSE 109 (H) 01/02/2018   BUN 20 01/02/2018   CREATININE 0.82 01/02/2018   BILITOT 0.5 01/02/2018   ALKPHOS 78 01/02/2018   AST 15 01/02/2018   ALT 11 01/02/2018   PROT 7.3 01/02/2018   ALBUMIN 3.9 01/02/2018   CALCIUM 9.0 01/02/2018   ANIONGAP 9 09/15/2017   GFR 88.56 01/02/2018   Lab Results  Component Value Date   CHOL 166 01/02/2018   Lab Results  Component Value Date   HDL 40.40 01/02/2018   Lab Results  Component Value Date   LDLCALC 118 (H) 01/02/2018   Lab Results  Component Value Date   TRIG 39.0 01/02/2018   Lab Results  Component Value Date   CHOLHDL 4 01/02/2018   Lab Results  Component Value Date   HGBA1C 5.4 01/02/2018         Assessment & Plan:   Problem List Items Addressed This Visit    None      I am having Iantha Fallen maintain her multivitamin, Vitamin D, Omega 3-6-9 Fatty Acids (OMEGA 3-6-9 COMPLEX PO), cetirizine, b complex vitamins, omeprazole, furosemide, amLODipine, ipratropium, Vitamin D (Ergocalciferol), albuterol, and losartan.  No orders of the defined types were placed in this encounter.   CMA served as Education administrator during this visit. History, Physical and Plan performed by medical provider. Documentation and orders reviewed and attested to.  Magdalene Molly, [place]

## 2018-01-09 NOTE — Assessment & Plan Note (Addendum)
Follows with Dr Annamaria Boots, they are awaiting a new machine

## 2018-01-09 NOTE — Assessment & Plan Note (Signed)
Encouraged DASH diet, decrease po intake and increase exercise as tolerated. Needs 7-8 hours of sleep nightly. Avoid trans fats, eat small, frequent meals every 4-5 hours with lean proteins, complex carbs and healthy fats. Minimize simple carbs 

## 2018-01-13 NOTE — Assessment & Plan Note (Signed)
Avoid offending foods, start probiotics. Do not eat large meals in late evening and consider raising head of bed.  

## 2018-01-13 NOTE — Assessment & Plan Note (Signed)
RRR today 

## 2018-01-13 NOTE — Assessment & Plan Note (Signed)
Encouraged Benefiber twice daily. If constipation worsens add Miralax with Benefiber

## 2018-01-14 ENCOUNTER — Telehealth: Payer: Self-pay | Admitting: Internal Medicine

## 2018-01-14 NOTE — Telephone Encounter (Signed)
Ref #[id number]

## 2018-01-14 NOTE — Telephone Encounter (Signed)
Spoke with [facility] and answered the clinical questions and will receive a determination within 72 hours. Will await decision. Route to KW to follow up.

## 2018-01-15 MED ORDER — ALBUTEROL SULFATE HFA 108 (90 BASE) MCG/ACT IN AERS: 2.0000 | INHALATION_SPRAY | Freq: Four times a day (QID) | RESPIRATORY_TRACT | 6 refills | Status: DC | PRN

## 2018-01-15 NOTE — Telephone Encounter (Signed)
Called pharmacy who advised that we send in a new rx for albuterol and state in the pharmacy notes to fill as the insurance preferred brand.  rx has been sent as requested.  Nothing further needed.

## 2018-01-15 NOTE — Telephone Encounter (Signed)
Information rec'd and states albuterol is not on patients formulary. We need to call pharmacy and ask which is preferred by Insurance. There is now a generic albuterol inhaler-okay to change to which ever Insurance will cover. Thanks.

## 2018-01-16 ENCOUNTER — Other Ambulatory Visit: Payer: Self-pay | Admitting: *Deleted

## 2018-01-16 MED ORDER — ALBUTEROL SULFATE HFA 108 (90 BASE) MCG/ACT IN AERS: 2.0000 | INHALATION_SPRAY | Freq: Four times a day (QID) | RESPIRATORY_TRACT | 6 refills | Status: DC | PRN

## 2018-01-16 NOTE — Progress Notes (Signed)
Sent new script of ventolin inhaler to pt's pharmacy as it is the covered rescue inhaler by pt's insurance.

## 2018-01-16 NOTE — Telephone Encounter (Signed)
Received clinical review from [facility] stating that the Albuterol inhaler was not covered by pt's insurance and that the Ventolin inhaler was the one that was covered.  Have discontinued the albuterol inhaler off of pt's med list and sent script of ventolin inhaler to pt's pharmacy and specified in there to make sure inhaler is ventolin.  Nothing further needed at this time.

## 2018-01-21 ENCOUNTER — Other Ambulatory Visit: Payer: Self-pay | Admitting: *Deleted

## 2018-01-21 MED ORDER — RANITIDINE HCL 300 MG PO TABS: 300.0000 mg | ORAL_TABLET | Freq: Every day | ORAL | 3 refills | Status: DC

## 2018-02-05 ENCOUNTER — Ambulatory Visit: Payer: Medicare HMO | Admitting: Internal Medicine

## 2018-02-07 ENCOUNTER — Ambulatory Visit (INDEPENDENT_AMBULATORY_CARE_PROVIDER_SITE_OTHER): Payer: Medicare HMO | Admitting: Medical

## 2018-02-07 VITALS — BP 148/81 | HR 67

## 2018-02-07 DIAGNOSIS — I10 Essential (primary) hypertension: Secondary | ICD-10-CM

## 2018-02-07 NOTE — Patient Instructions (Signed)
Continue checking BPs. If above 140/90 consistently let us know. Follow-up as already scheduled.

## 2018-02-07 NOTE — Progress Notes (Signed)
Pre visit review using our clinic review tool, if applicable. No additional management support is needed unless otherwise documented below in the visit note.  Pt here today for BP check.   BP in L arm: 148/81 Pulse: 67  Pt admits to forgetting BP meds this morning as she was rushing to get here.   Per Percell Miller: Continue checking BPs. If above 140/90 consistently let us know. Follow-up as already scheduled.   Pt verbalized understanding.   This is advise I gave her. She is on amlodipine, losartan and lasix. So expect bp to decrease and be in normal range with med.  Mackie Pai, PA-C

## 2018-03-07 ENCOUNTER — Telehealth: Payer: Self-pay | Admitting: Internal Medicine

## 2018-03-07 NOTE — Telephone Encounter (Signed)
Routing message to P[facility] per protocol. 

## 2018-03-07 NOTE — Telephone Encounter (Signed)
Called [facility] & spoke to [place].  She states they have called the patient 3 times & left her vm's to call them to get set up.  Pt has not called them back.  I called pt & told her this.  Gave her their phone # so she can call them.  Nothing further needed.

## 2018-03-13 DIAGNOSIS — G4733 Obstructive sleep apnea (adult) (pediatric): Secondary | ICD-10-CM | POA: Diagnosis not present

## 2018-03-21 ENCOUNTER — Ambulatory Visit: Payer: Medicare HMO | Admitting: Internal Medicine

## 2018-03-26 NOTE — Progress Notes (Addendum)
Subjective:   Anne Barnes is a 72 y.o. female who presents for an Initial Medicare Annual Wellness Visit.  Pt is retired [employer] from [employer]  Review of Systems   No ROS.  Medicare Wellness Visit. Additional risk factors are reflected in the social history. Cardiac Risk Factors include: advanced age (>36men, >109 women);dyslipidemia;hypertension;obesity (BMI >30kg/m2) Sleep patterns: tries to wear cpap. Sleep varies. Naps as needed.  Home Safety/Smoke Alarms: Feels safe in home. Smoke alarms in place.  Living environment; residence and Firearm Safety: Lives with husband. Daughter and 2 sons live with her.   Female:        Mammo- declines      Dexa scan- declines       CCS- last 10/21/09     Objective:    Today's Vitals   03/31/18 0928  BP: (!) 150/82  Pulse: 60  SpO2: 96%  Weight: 227 lb (103 kg)  Height: 5' (1.524 m)   Body mass index is 44.33 kg/m.  Advanced Directives 03/31/2018 09/15/2017 02/19/2017 01/10/2015 05/28/2014  Does Patient Have a Medical Advance Directive? Yes No No No No  Type of Paramedic of [place];Living will - - - -  Does patient want to make changes to medical advance directive? No - Patient declined - - - -  Copy of [place] in Chart? No - copy requested - - - -  Would patient like information on creating a medical advance directive? - No - Patient declined - No - patient declined information No - patient declined information    Current Medications (verified) Outpatient Encounter Medications as of 03/31/2018  Medication Sig  . albuterol (PROVENTIL HFA;VENTOLIN HFA) 108 (90 Base) MCG/ACT inhaler Inhale 2 puffs into the lungs every 6 (six) hours as needed for wheezing or shortness of breath.  Marland Kitchen b complex vitamins tablet Take 1 tablet by mouth daily.  . cetirizine (ZYRTEC) 10 MG tablet Take 1 tablet (10 mg total) by mouth daily.  . Cholecalciferol (VITAMIN D) 2000 UNITS CAPS Take by mouth daily.   Marland Kitchen  ipratropium (ATROVENT) 0.06 % nasal spray 1-2 puffs each nostril twice daily as needed  . losartan (COZAAR) 100 MG tablet Take 1 tablet (100 mg total) by mouth daily.  . Multiple Vitamin (MULTIVITAMIN) tablet Take 1 tablet by mouth daily.    . Omega 3-6-9 Fatty Acids (OMEGA 3-6-9 COMPLEX PO) Take 1 capsule by mouth daily.    . ranitidine (ZANTAC) 300 MG tablet Take 1 tablet (300 mg total) by mouth at bedtime.  . Vitamin D, Ergocalciferol, (DRISDOL) 50000 units CAPS capsule Take 1 capsule (50,000 Units total) by mouth every 7 (seven) days.  Marland Kitchen amLODipine (NORVASC) 2.5 MG tablet Take 1 tablet (2.5 mg total) by mouth daily.  . furosemide (LASIX) 40 MG tablet Take 1 tablet (40 mg total) by mouth daily.  Marland Kitchen omeprazole (PRILOSEC) 20 MG capsule Take 1 capsule (20 mg total) by mouth daily. (Patient not taking: Reported on 03/31/2018)   No facility-administered encounter medications on file as of 03/31/2018.     Allergies (verified) Codeine; Lisinopril-hydrochlorothiazide; Metoprolol; and Other   History: Past Medical History:  Diagnosis Date  . ALLERGIC RHINITIS   . Allergic urticaria 08/01/2014   Patient reports allergies to shrimp, egg whites, tomatoes, dairy   . Bradycardia 02/25/2017  . Chiari malformation    Noted MRI brain 09/2013 - s/p neuro eval for same  . DIVERTICULOSIS, COLON   . Frequent headaches   .  GERD   . H/O measles   . H/O mumps   . Hearing loss 01/18/2017  . History of chicken pox   . HYPERTENSION   . Hypocalcemia 02/25/2017  . IBS (irritable bowel syndrome) 05/26/2015  . OBSTRUCTIVE SLEEP APNEA 12/2008 dx   noncompliant with CPAP qhs  . Preventative health care 06/11/2017  . Sciatica    right side  . Sinusitis 06/06/2017  . 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 Vaginitis 01/18/2017  . Vitamin D deficiency 06/11/2017   Past Surgical History:  Procedure Laterality Date  . ABDOMINAL HYSTERECTOMY  1986  . BREAST SURGERY  1973   Breast biopsy  . UMBILICAL HERNIA REPAIR     Family  History  Problem Relation Age of Onset  . Arthritis Mother        died of complications from hip surgery  . Arthritis Father   . Kidney disease Father   . Pneumonia Father   . Heart disease Father   . Lupus Daughter   . Rheum arthritis Daughter   . Sjogren's syndrome Daughter   . Fibromyalgia Daughter   . Diabetes Brother   . Heart disease Brother   . Heart attack Brother   . Alcohol abuse Brother   . Throat cancer Brother   . Lung cancer Brother   . Kidney disease Sister        dialysis  . Obesity Sister   . Hypertension Maternal Grandfather   . Hypertension Sister   . Kidney disease Sister        dialysis  . Pneumonia Brother   . Hypertension Daughter   . Graves' disease Daughter   . Hypertension Daughter   . Alcohol abuse Other        parent  . Ataxia Neg Hx   . Chorea Neg Hx   . Dementia Neg Hx   . Mental retardation Neg Hx   . Migraines Neg Hx   . Multiple sclerosis Neg Hx   . Neurofibromatosis Neg Hx   . Neuropathy Neg Hx   . Parkinsonism Neg Hx   . Seizures Neg Hx   . Stroke Neg Hx    Social History   Socioeconomic History  . Marital status: Married    Spouse name: Not on file  . Number of children: Not on file  . Years of education: Not on file  . Highest education level: Not on file  Occupational History  . Occupation: retired  Scientific laboratory technician  . Financial resource strain: Not on file  . Food insecurity:    Worry: Not on file    Inability: Not on file  . Transportation needs:    Medical: Not on file    Non-medical: Not on file  Tobacco Use  . Smoking status: Never Smoker  . Smokeless tobacco: Never Used  . Tobacco comment: Married, lives with spouse, dtr 2 g-kids.  Substance and Sexual Activity  . Alcohol use: No    Comment: rare  . Drug use: No  . Sexual activity: Not Currently    Comment: lives with husband, no dietary restrictions avoid foods allergic to, retired  Lifestyle  . Physical activity:    Days per week: Not on file     Minutes per session: Not on file  . Stress: Not on file  Relationships  . Social connections:    Talks on phone: Not on file    Gets together: Not on file    Attends religious service: Not on file  Active member of club or organization: Not on file    Attends meetings of clubs or organizations: Not on file    Relationship status: Not on file  Other Topics Concern  . Not on file  Social History Narrative  . Not on file    Tobacco Counseling Counseling given: Not Answered Comment: Married, lives with spouse, dtr 2 g-kids.   Clinical Intake:     Pain : No/denies pain                  Activities of Daily Living In your present state of health, do you have any difficulty performing the following activities: 03/31/2018 06/06/2017  Hearing? N N  Vision? N N  Difficulty concentrating or making decisions? N N  Walking or climbing stairs? N N  Dressing or bathing? N N  Doing errands, shopping? N N  Preparing Food and eating ? N -  Using the Toilet? N -  In the past six months, have you accidently leaked urine? N -  Do you have problems with loss of bowel control? N -  Managing your Medications? N -  Managing your Finances? N -  Housekeeping or managing your Housekeeping? N -  Some recent data might be hidden     Immunizations and Health Maintenance Immunization History  Administered Date(s) Administered  . Influenza, High Dose Seasonal PF 07/15/2013  . Influenza,inj,Quad PF,6+ Mos 08/29/2015  . Pneumococcal Conjugate-13 11/30/2015  . Pneumococcal Polysaccharide-23 02/26/2013  . Td 09/25/2003  . Tdap 08/29/2015   There are no preventive care reminders to display for this patient.  Patient Care Team: Mosie Lukes, MD as PCP - General (Family Medicine) Juanita Craver, MD (Gastroenterology) Deneise Lever, MD as Consulting Physician (Pulmonary Disease)  Indicate any recent Medical Services you may have received from other than [facility] providers in the past  year (date may be approximate).     Assessment:   This is a routine wellness examination for Anne Barnes. Physical assessment deferred to PCP.  Hearing/Vision screen Hearing Screening Comments: Able to hear conversational tones w/o difficulty. No issues reported.   Vision Screening Comments: Pt normally wears glasses, but did not bring them in with her today.  Dietary issues and exercise activities discussed: Current Exercise Habits: Home exercise routine, Type of exercise: walking, Time (Minutes): 10, Frequency (Times/Week): 5, Weekly Exercise (Minutes/Week): 50, Intensity: Mild, Exercise limited by: None identified Diet (meal preparation, eat out, water intake, caffeinated beverages, dairy products, fruits and vegetables): well balanced Breakfast:oatmeal and fruit.  Lunch: fruit Dinner:   Lean mean rarely. Vegetables.  Reports drinking 64oz of water per day.  Goals    . Weight (lb) < 200 lb (90.7 kg)      Depression Screen PHQ 2/9 Scores 03/31/2018 06/07/2016 05/26/2015 02/26/2013  PHQ - 2 Score 0 0 0 4  PHQ- 9 Score - - - 8    Fall Risk Fall Risk  03/31/2018 06/07/2016 05/26/2015 02/26/2013  Falls in the past year? No No No No    Cognitive Function: Ad8 score reviewed for issues:  Issues making decisions:no  Less interest in hobbies / activities:no  Repeats questions, stories (family complaining):no  Trouble using ordinary gadgets (microwave, computer, phone):no  Forgets the month or year: no  Mismanaging finances: no  Remembering appts:no  Daily problems with thinking and/or memory:no Ad8 score is=0        Screening Tests Health Maintenance  Topic Date Due  . INFLUENZA VACCINE  07/10/2018 (Originally 04/24/2018)  .  MAMMOGRAM  01/10/2019 (Originally 11/21/2017)  . COLONOSCOPY  10/22/2019  . TETANUS/TDAP  08/28/2025  . DEXA SCAN  Completed  . Hepatitis C Screening  Completed  . PNA vac Low Risk Adult  Completed     Plan:    Please schedule your next medicare wellness  visit with me in 1 yr.  Continue doing brain stimulating activities (puzzles, reading, adult coloring books, staying active) to keep memory sharp.   Continue to eat heart healthy diet (full of fruits, vegetables, whole grains, lean protein, water--limit salt, fat, and sugar intake) and increase physical activity as tolerated.  Bring a copy of your living will and/or healthcare power of attorney to your next office visit.    I have personally reviewed and noted the following in the patient's chart:   . Medical and social history . Use of alcohol, tobacco or illicit drugs  . Current medications and supplements . Functional ability and status . Nutritional status . Physical activity . Advanced directives . List of other physicians . Hospitalizations, surgeries, and ER visits in previous 12 months . Vitals . Screenings to include cognitive, depression, and falls . Referrals and appointments  In addition, I have reviewed and discussed with patient certain preventive protocols, quality metrics, and best practice recommendations. A written personalized care plan for preventive services as well as general preventive health recommendations were provided to patient.     Shela Nevin, [place]   03/31/2018   Medical screening examination/treatment was performed by qualified clinical staff member and as supervising physician I was immediately available for consultation/collaboration. I have reviewed documentation and agree with assessment and plan.  Anne Homans, MD

## 2018-03-31 ENCOUNTER — Encounter: Payer: Self-pay | Admitting: *Deleted

## 2018-03-31 ENCOUNTER — Ambulatory Visit (INDEPENDENT_AMBULATORY_CARE_PROVIDER_SITE_OTHER): Payer: Medicare HMO | Admitting: *Deleted

## 2018-03-31 ENCOUNTER — Other Ambulatory Visit (INDEPENDENT_AMBULATORY_CARE_PROVIDER_SITE_OTHER): Payer: Medicare HMO

## 2018-03-31 VITALS — BP 150/82 | HR 60 | Ht 60.0 in | Wt 227.0 lb

## 2018-03-31 DIAGNOSIS — I10 Essential (primary) hypertension: Secondary | ICD-10-CM | POA: Diagnosis not present

## 2018-03-31 DIAGNOSIS — Z0000 Encounter for general adult medical examination without abnormal findings: Secondary | ICD-10-CM

## 2018-03-31 DIAGNOSIS — E559 Vitamin D deficiency, unspecified: Secondary | ICD-10-CM

## 2018-03-31 DIAGNOSIS — R739 Hyperglycemia, unspecified: Secondary | ICD-10-CM

## 2018-03-31 DIAGNOSIS — E785 Hyperlipidemia, unspecified: Secondary | ICD-10-CM

## 2018-03-31 LAB — COMPREHENSIVE METABOLIC PANEL
ALBUMIN: 3.9 g/dL (ref 3.5–5.2)
ALT: 12 U/L (ref 0–35)
AST: 13 U/L (ref 0–37)
Alkaline Phosphatase: 75 U/L (ref 39–117)
BUN: 17 mg/dL (ref 6–23)
CO2: 29 mEq/L (ref 19–32)
CREATININE: 0.81 mg/dL (ref 0.40–1.20)
Calcium: 9.1 mg/dL (ref 8.4–10.5)
Chloride: 105 mEq/L (ref 96–112)
GFR: 89.76 mL/min (ref >60.00)
Glucose, Bld: 98 mg/dL (ref 70–99)
POTASSIUM: 3.7 meq/L (ref 3.5–5.1)
SODIUM: 142 meq/L (ref 135–145)
TOTAL PROTEIN: 6.9 g/dL (ref 6.0–8.3)
Total Bilirubin: 0.4 mg/dL (ref 0.2–1.2)

## 2018-03-31 LAB — LIPID PANEL
CHOLESTEROL: 174 mg/dL (ref 0–200)
HDL: 43.5 mg/dL (ref >39.00)
LDL Cholesterol: 120 mg/dL — ABNORMAL HIGH (ref 0–99)
NonHDL: 130.2
Total CHOL/HDL Ratio: 4
Triglycerides: 51 mg/dL (ref 0.0–149.0)
VLDL: 10.2 mg/dL (ref 0.0–40.0)

## 2018-03-31 LAB — CBC
HEMATOCRIT: 37.9 % (ref 36.0–46.0)
HEMOGLOBIN: 12.6 g/dL (ref 12.0–15.0)
MCHC: 33.4 g/dL (ref 30.0–36.0)
MCV: 89.8 fl (ref 78.0–100.0)
Platelets: 279 10*3/uL (ref 150.0–400.0)
RBC: 4.22 Mil/uL (ref 3.87–5.11)
RDW: 13.7 % (ref 11.5–15.5)
WBC: 8.8 10*3/uL (ref 4.0–10.5)

## 2018-03-31 LAB — TSH: TSH: 2.15 u[IU]/mL (ref 0.35–4.50)

## 2018-03-31 LAB — HEMOGLOBIN A1C: Hgb A1c MFr Bld: 5.5 % (ref 4.6–6.5)

## 2018-03-31 LAB — VITAMIN D 25 HYDROXY (VIT D DEFICIENCY, FRACTURES): VITD: 31.2 ng/mL (ref 30.00–100.00)

## 2018-03-31 NOTE — Patient Instructions (Signed)
Please schedule your next medicare wellness visit with me in 1 yr.  Continue doing brain stimulating activities (puzzles, reading, adult coloring books, staying active) to keep memory sharp.   Continue to eat heart healthy diet (full of fruits, vegetables, whole grains, lean protein, water--limit salt, fat, and sugar intake) and increase physical activity as tolerated.  Bring a copy of your living will and/or healthcare power of attorney to your next office visit.   [name] , Thank you for taking time to come for your Medicare Wellness Visit. I appreciate your ongoing commitment to your health goals. Please review the following plan we discussed and let me know if I can assist you in the future.   These are the goals we discussed: Goals    . Weight (lb) < 200 lb (90.7 kg)       This is a list of the screening recommended for you and due dates:  Health Maintenance  Topic Date Due  . Flu Shot  07/10/2018*  . Mammogram  01/10/2019*  . Colon Cancer Screening  10/22/2019  . Tetanus Vaccine  08/28/2025  . DEXA scan (bone density measurement)  Completed  .  Hepatitis C: One time screening is recommended by Center for Disease Control  (CDC) for  adults born from [birth year] through 1964.   Completed  . Pneumonia vaccines  Completed  *Topic was postponed. The date shown is not the original due date.    Health Maintenance for Postmenopausal Women Menopause is a normal process in which your reproductive ability comes to an end. This process happens gradually over a span of months to years, usually between the ages of 45 and 27. Menopause is complete when you have missed 12 consecutive menstrual periods. It is important to talk with your health care provider about some of the most common conditions that affect postmenopausal women, such as heart disease, cancer, and bone loss (osteoporosis). Adopting a healthy lifestyle and getting preventive care can help to promote your health and wellness. Those  actions can also lower your chances of developing some of these common conditions. What should I know about menopause? During menopause, you may experience a number of symptoms, such as:  Moderate-to-severe hot flashes.  Night sweats.  Decrease in sex drive.  Mood swings.  Headaches.  Tiredness.  Irritability.  Memory problems.  Insomnia.  Choosing to treat or not to treat menopausal changes is an individual decision that you make with your health care provider. What should I know about hormone replacement therapy and supplements? Hormone therapy products are effective for treating symptoms that are associated with menopause, such as hot flashes and night sweats. Hormone replacement carries certain risks, especially as you become older. If you are thinking about using estrogen or estrogen with progestin treatments, discuss the benefits and risks with your health care provider. What should I know about heart disease and stroke? Heart disease, heart attack, and stroke become more likely as you age. This may be due, in part, to the hormonal changes that your body experiences during menopause. These can affect how your body processes dietary fats, triglycerides, and cholesterol. Heart attack and stroke are both medical emergencies. There are many things that you can do to help prevent heart disease and stroke:  Have your blood pressure checked at least every 1-2 years. High blood pressure causes heart disease and increases the risk of stroke.  If you are 71-48 years old, ask your health care provider if you should take aspirin to prevent  a heart attack or a stroke.  Do not use any tobacco products, including cigarettes, chewing tobacco, or electronic cigarettes. If you need help quitting, ask your health care provider.  It is important to eat a healthy diet and maintain a healthy weight. ? Be sure to include plenty of vegetables, fruits, low-fat dairy products, and lean  protein. ? Avoid eating foods that are high in solid fats, added sugars, or salt (sodium).  Get regular exercise. This is one of the most important things that you can do for your health. ? Try to exercise for at least 150 minutes each week. The type of exercise that you do should increase your heart rate and make you sweat. This is known as moderate-intensity exercise. ? Try to do strengthening exercises at least twice each week. Do these in addition to the moderate-intensity exercise.  Know your numbers.Ask your health care provider to check your cholesterol and your blood glucose. Continue to have your blood tested as directed by your health care provider.  What should I know about cancer screening? There are several types of cancer. Take the following steps to reduce your risk and to catch any cancer development as early as possible. Breast Cancer  Practice breast self-awareness. ? This means understanding how your breasts normally appear and feel. ? It also means doing regular breast self-exams. Let your health care provider know about any changes, no matter how small.  If you are 71 or older, have a clinician do a breast exam (clinical breast exam or CBE) every year. Depending on your age, family history, and medical history, it may be recommended that you also have a yearly breast X-ray (mammogram).  If you have a family history of breast cancer, talk with your health care provider about genetic screening.  If you are at high risk for breast cancer, talk with your health care provider about having an MRI and a mammogram every year.  Breast cancer (BRCA) gene test is recommended for women who have family members with BRCA-related cancers. Results of the assessment will determine the need for genetic counseling and BRCA1 and for BRCA2 testing. BRCA-related cancers include these types: ? Breast. This occurs in males or females. ? Ovarian. ? Tubal. This may also be called fallopian tube  cancer. ? Cancer of the abdominal or pelvic lining (peritoneal cancer). ? Prostate. ? Pancreatic.  Cervical, Uterine, and Ovarian Cancer Your health care provider may recommend that you be screened regularly for cancer of the pelvic organs. These include your ovaries, uterus, and vagina. This screening involves a pelvic exam, which includes checking for microscopic changes to the surface of your cervix (Pap test).  For women ages 21-65, health care providers may recommend a pelvic exam and a Pap test every three years. For women ages 66-65, they may recommend the Pap test and pelvic exam, combined with testing for human papilloma virus (HPV), every five years. Some types of HPV increase your risk of cervical cancer. Testing for HPV may also be done on women of any age who have unclear Pap test results.  Other health care providers may not recommend any screening for nonpregnant women who are considered low risk for pelvic cancer and have no symptoms. Ask your health care provider if a screening pelvic exam is right for you.  If you have had past treatment for cervical cancer or a condition that could lead to cancer, you need Pap tests and screening for cancer for at least 20 years after your  treatment. If Pap tests have been discontinued for you, your risk factors (such as having a new sexual partner) need to be reassessed to determine if you should start having screenings again. Some women have medical problems that increase the chance of getting cervical cancer. In these cases, your health care provider may recommend that you have screening and Pap tests more often.  If you have a family history of uterine cancer or ovarian cancer, talk with your health care provider about genetic screening.  If you have vaginal bleeding after reaching menopause, tell your health care provider.  There are currently no reliable tests available to screen for ovarian cancer.  Lung Cancer Lung cancer screening is  recommended for adults 54-4 years old who are at high risk for lung cancer because of a history of smoking. A yearly low-dose CT scan of the lungs is recommended if you:  Currently smoke.  Have a history of at least 30 pack-years of smoking and you currently smoke or have quit within the past 15 years. A pack-year is smoking an average of one pack of cigarettes per day for one year.  Yearly screening should:  Continue until it has been 15 years since you quit.  Stop if you develop a health problem that would prevent you from having lung cancer treatment.  Colorectal Cancer  This type of cancer can be detected and can often be prevented.  Routine colorectal cancer screening usually begins at age 3 and continues through age 51.  If you have risk factors for colon cancer, your health care provider may recommend that you be screened at an earlier age.  If you have a family history of colorectal cancer, talk with your health care provider about genetic screening.  Your health care provider may also recommend using home test kits to check for hidden blood in your stool.  A small camera at the end of a tube can be used to examine your colon directly (sigmoidoscopy or colonoscopy). This is done to check for the earliest forms of colorectal cancer.  Direct examination of the colon should be repeated every 5-10 years until age 46. However, if early forms of precancerous polyps or small growths are found or if you have a family history or genetic risk for colorectal cancer, you may need to be screened more often.  Skin Cancer  Check your skin from head to toe regularly.  Monitor any moles. Be sure to tell your health care provider: ? About any new moles or changes in moles, especially if there is a change in a mole's shape or color. ? If you have a mole that is larger than the size of a pencil eraser.  If any of your family members has a history of skin cancer, especially at a young age,  talk with your health care provider about genetic screening.  Always use sunscreen. Apply sunscreen liberally and repeatedly throughout the day.  Whenever you are outside, protect yourself by wearing long sleeves, pants, a wide-brimmed hat, and sunglasses.  What should I know about osteoporosis? Osteoporosis is a condition in which bone destruction happens more quickly than new bone creation. After menopause, you may be at an increased risk for osteoporosis. To help prevent osteoporosis or the bone fractures that can happen because of osteoporosis, the following is recommended:  If you are 71-18 years old, get at least 1,000 mg of calcium and at least 600 mg of vitamin D per day.  If you are older than age  62 but younger than age 75, get at least 1,200 mg of calcium and at least 600 mg of vitamin D per day.  If you are older than age 68, get at least 1,200 mg of calcium and at least 800 mg of vitamin D per day.  Smoking and excessive alcohol intake increase the risk of osteoporosis. Eat foods that are rich in calcium and vitamin D, and do weight-bearing exercises several times each week as directed by your health care provider. What should I know about how menopause affects my mental health? Depression may occur at any age, but it is more common as you become older. Common symptoms of depression include:  Low or sad mood.  Changes in sleep patterns.  Changes in appetite or eating patterns.  Feeling an overall lack of motivation or enjoyment of activities that you previously enjoyed.  Frequent crying spells.  Talk with your health care provider if you think that you are experiencing depression. What should I know about immunizations? It is important that you get and maintain your immunizations. These include:  Tetanus, diphtheria, and pertussis (Tdap) booster vaccine.  Influenza every year before the flu season begins.  Pneumonia vaccine.  Shingles vaccine.  Your health care  provider may also recommend other immunizations. This information is not intended to replace advice given to you by your health care provider. Make sure you discuss any questions you have with your health care provider. Document Released: 11/02/2005 Document Revised: 03/30/2016 Document Reviewed: 06/14/2015 Elsevier Interactive Patient Education  2018 [employer].

## 2018-04-11 ENCOUNTER — Ambulatory Visit (INDEPENDENT_AMBULATORY_CARE_PROVIDER_SITE_OTHER): Payer: Medicare HMO | Admitting: Family Medicine

## 2018-04-11 DIAGNOSIS — L500 Allergic urticaria: Secondary | ICD-10-CM

## 2018-04-11 DIAGNOSIS — I5041 Acute combined systolic (congestive) and diastolic (congestive) heart failure: Secondary | ICD-10-CM

## 2018-04-11 DIAGNOSIS — G4733 Obstructive sleep apnea (adult) (pediatric): Secondary | ICD-10-CM | POA: Diagnosis not present

## 2018-04-11 DIAGNOSIS — E559 Vitamin D deficiency, unspecified: Secondary | ICD-10-CM | POA: Diagnosis not present

## 2018-04-11 DIAGNOSIS — I10 Essential (primary) hypertension: Secondary | ICD-10-CM | POA: Diagnosis not present

## 2018-04-11 DIAGNOSIS — K219 Gastro-esophageal reflux disease without esophagitis: Secondary | ICD-10-CM | POA: Diagnosis not present

## 2018-04-11 DIAGNOSIS — E669 Obesity, unspecified: Secondary | ICD-10-CM | POA: Diagnosis not present

## 2018-04-11 DIAGNOSIS — R739 Hyperglycemia, unspecified: Secondary | ICD-10-CM | POA: Diagnosis not present

## 2018-04-11 MED ORDER — AMLODIPINE BESYLATE 2.5 MG PO TABS: 2.5000 mg | ORAL_TABLET | Freq: Two times a day (BID) | ORAL | 1 refills | Status: DC

## 2018-04-11 NOTE — Assessment & Plan Note (Signed)
Struggles to use CPAP she wakes up with it on her chest regularly and not on her face.

## 2018-04-11 NOTE — Assessment & Plan Note (Addendum)
Not well controlled, no changes to meds. Encouraged heart healthy diet such as the DASH diet and exercise as tolerated. Increase the Amlodipine to 2.5 mg bid

## 2018-04-11 NOTE — Assessment & Plan Note (Signed)
Supplement, monitor

## 2018-04-11 NOTE — Assessment & Plan Note (Signed)
hgba1c acceptable, minimize simple carbs. Increase exercise as tolerated.  

## 2018-04-11 NOTE — Patient Instructions (Addendum)
Encouraged increased rest and hydration, add probiotics, zinc such as Coldeze or Xicam. Treat fevers as needed. Elderberry, vitamin C can help and plain Mucinex twice daily  For hives: Zyrtec/Cetirizine 10 mg tabs and Zantac/Ranitidine 150 mg tabs both twice daily when hives occur Allergies, Adult An allergy is when your body's defense system (immune system) overreacts to an otherwise harmless substance (allergen) that you breathe in or eat or something that touches your skin. When you come into contact with something that you are allergic to, your immune system produces certain proteins (antibodies). These proteins cause cells to release chemicals (histamines) that trigger the symptoms of an allergic reaction. Allergies often affect the nasal passages (allergic rhinitis), eyes (allergic conjunctivitis), skin (atopic dermatitis), and stomach. Allergies can be mild or severe. Allergies cannot spread from person to person (are not contagious). They can develop at any age and may be outgrown. What increases the risk? You may be at greater risk of allergies if other people in your family have allergies. What are the signs or symptoms? Symptoms depend on what type of allergy you have. They may include:  Runny, stuffy nose.  Sneezing.  Itchy mouth, ears, or throat.  Postnasal drip.  Sore throat.  Itchy, red, watery, or puffy eyes.  Skin rash or hives.  Stomach pain.  Vomiting.  Diarrhea.  Bloating.  Wheezing or coughing.  People with a severe allergy to food, medicine, or an insect bite may have a life-threatening allergic reaction (anaphylaxis). Symptoms of anaphylaxis include:  Hives.  Itching.  Flushed face.  Swollen lips, tongue, or mouth.  Tight or swollen throat.  Chest pain or tightness in the chest.  Trouble breathing or shortness of breath.  Rapid heartbeat.  Dizziness or fainting.  Vomiting.  Diarrhea.  Pain in the abdomen.  How is this  diagnosed? This condition is diagnosed based on:  Your symptoms.  Your family and medical history.  A physical exam.  You may need to see a health care provider who specializes in treating allergies (allergist). You may also have tests, including:  Skin tests to see which allergens are causing your symptoms, such as: ? Skin prick test. In this test, your skin is pricked with a tiny needle and exposed to small amounts of possible allergens to see if your skin reacts. ? Intradermal skin test. In this test, a small amount of allergen is injected under your skin to see if your skin reacts. ? Patch test. In this test, a small amount of allergen is placed on your skin and then your skin is covered with a bandage. Your health care provider will check your skin after a couple of days to see if a rash has developed.  Blood tests.  Challenges tests. In this test, you inhale a small amount of allergen by mouth to see if you have an allergic reaction.  You may also be asked to:  Keep a food diary. A food diary is a record of all the foods and drinks you have in a day and any symptoms you experience.  Practice an elimination diet. An elimination diet involves eliminating specific foods from your diet and then adding them back in one by one to find out if a certain food causes an allergic reaction.  How is this treated? Treatment for allergies depends on your symptoms. Treatment may include:  Cold compresses to soothe itching and swelling.  Eye drops.  Nasal sprays.  Using a saline spray or container (neti pot) to flush out  the nose (nasal irrigation). These methods can help clear away mucus and keep the nasal passages moist.  Using a humidifier.  Oral antihistamines or other medicines to block allergic reaction and inflammation.  Skin creams to treat rashes or itching.  Diet changes to eliminate food allergy triggers.  Repeated exposure to tiny amounts of allergens to build up a  tolerance and prevent future allergic reactions (immunotherapy). These include: ? Allergy shots. ? Oral treatment. This involves taking small doses of an allergen under the tongue (sublingual immunotherapy).  Emergency epinephrine injection (auto-injector) in case of an allergic emergency. This is a self-injectable, pre-measured medicine that must be given within the first few minutes of a serious allergic reaction.  Follow these instructions at home:  Avoid known allergens whenever possible.  If you suffer from airborne allergens, wash out your nose daily. You can do this with a saline spray or a neti pot to flush out your nose (nasal irrigation).  Take over-the-counter and prescription medicines only as told by your health care provider.  Keep all follow-up visits as told by your health care provider. This is important.  If you are at risk of a severe allergic reaction (anaphylaxis), keep your auto-injector with you at all times.  If you have ever had anaphylaxis, wear a medical alert bracelet or necklace that states you have a severe allergy. Contact a health care provider if:  Your symptoms do not improve with treatment. Get help right away if:  You have symptoms of anaphylaxis, such as: ? Swollen mouth, tongue, or throat. ? Pain or tightness in your chest. ? Trouble breathing or shortness of breath. ? Dizziness or fainting. ? Severe abdominal pain, vomiting, or diarrhea. This information is not intended to replace advice given to you by your health care provider. Make sure you discuss any questions you have with your health care provider. Document Released: 12/04/2002 Document Revised: 01/09/2017 Document Reviewed: 03/28/2016 Elsevier Interactive Patient Education  [employer].

## 2018-04-11 NOTE — Progress Notes (Signed)
Subjective:  I acted as a Education administrator for Dr. Charlett Blake. Anne Barnes, [place]  Patient ID: Anne Barnes, female    DOB: 11/04/1945, 72 y.o.   MRN: [id number]  No chief complaint on file.   HPI  Patient is in today for 3 month follow up. She is following up on he hives which have improved. She is noting some recent incease in nasal congestion but she denies any fevers or chills. She notes some fatigue but othewise notes no other acute concerns. No recent hospitalizations. Denies CP/palp/SOB/HA/fevers/GI or GU c/o. Taking meds as prescribed  Patient Care Team: Mosie Lukes, MD as PCP - General (Family Medicine) Juanita Craver, MD (Gastroenterology) Deneise Lever, MD as Consulting Physician (Pulmonary Disease)   Past Medical History:  Diagnosis Date  . ALLERGIC RHINITIS   . Allergic urticaria 08/01/2014   Patient reports allergies to shrimp, egg whites, tomatoes, dairy   . Bradycardia 02/25/2017  . Chiari malformation    Noted MRI brain 09/2013 - s/p neuro eval for same  . DIVERTICULOSIS, COLON   . Frequent headaches   . GERD   . H/O measles   . H/O mumps   . Hearing loss 01/18/2017  . History of chicken pox   . HYPERTENSION   . Hypocalcemia 02/25/2017  . IBS (irritable bowel syndrome) 05/26/2015  . OBSTRUCTIVE SLEEP APNEA 12/2008 dx   noncompliant with CPAP qhs  . Preventative health care 06/11/2017  . Sciatica    right side  . Sinusitis 06/06/2017  . 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 Vaginitis 01/18/2017  . Vitamin D deficiency 06/11/2017    Past Surgical History:  Procedure Laterality Date  . ABDOMINAL HYSTERECTOMY  1986  . BREAST SURGERY  1973   Breast biopsy  . UMBILICAL HERNIA REPAIR      Family History  Problem Relation Age of Onset  . Arthritis Mother        died of complications from hip surgery  . Arthritis Father   . Kidney disease Father   . Pneumonia Father   . Heart disease Father   . Lupus Daughter   . Rheum arthritis Daughter   . Sjogren's syndrome Daughter     . Fibromyalgia Daughter   . Diabetes Brother   . Heart disease Brother   . Heart attack Brother   . Alcohol abuse Brother   . Throat cancer Brother   . Lung cancer Brother   . Kidney disease Sister        dialysis  . Obesity Sister   . Hypertension Maternal Grandfather   . Hypertension Sister   . Kidney disease Sister        dialysis  . Pneumonia Brother   . Hypertension Daughter   . Graves' disease Daughter   . Hypertension Daughter   . Alcohol abuse Other        parent  . Ataxia Neg Hx   . Chorea Neg Hx   . Dementia Neg Hx   . Mental retardation Neg Hx   . Migraines Neg Hx   . Multiple sclerosis Neg Hx   . Neurofibromatosis Neg Hx   . Neuropathy Neg Hx   . Parkinsonism Neg Hx   . Seizures Neg Hx   . Stroke Neg Hx     Social History   Socioeconomic History  . Marital status: Married    Spouse name: Not on file  . Number of children: Not on file  . Years of education: Not on file  .  Highest education level: Not on file  Occupational History  . Occupation: retired  Scientific laboratory technician  . Financial resource strain: Not on file  . Food insecurity:    Worry: Not on file    Inability: Not on file  . Transportation needs:    Medical: Not on file    Non-medical: Not on file  Tobacco Use  . Smoking status: Never Smoker  . Smokeless tobacco: Never Used  . Tobacco comment: Married, lives with spouse, dtr 2 g-kids.  Substance and Sexual Activity  . Alcohol use: No    Comment: rare  . Drug use: No  . Sexual activity: Not Currently    Comment: lives with husband, no dietary restrictions avoid foods allergic to, retired  Lifestyle  . Physical activity:    Days per week: Not on file    Minutes per session: Not on file  . Stress: Not on file  Relationships  . Social connections:    Talks on phone: Not on file    Gets together: Not on file    Attends religious service: Not on file    Active member of club or organization: Not on file    Attends meetings of clubs or  organizations: Not on file    Relationship status: Not on file  . Intimate partner violence:    Fear of current or ex partner: Not on file    Emotionally abused: Not on file    Physically abused: Not on file    Forced sexual activity: Not on file  Other Topics Concern  . Not on file  Social History Narrative  . Not on file    Outpatient Medications Prior to Visit  Medication Sig Dispense Refill  . albuterol (PROVENTIL HFA;VENTOLIN HFA) 108 (90 Base) MCG/ACT inhaler Inhale 2 puffs into the lungs every 6 (six) hours as needed for wheezing or shortness of breath. 1 Inhaler 6  . b complex vitamins tablet Take 1 tablet by mouth daily.    . cetirizine (ZYRTEC) 10 MG tablet Take 1 tablet (10 mg total) by mouth daily. 30 tablet 11  . Cholecalciferol (VITAMIN D) 2000 UNITS CAPS Take by mouth daily.     Marland Kitchen ipratropium (ATROVENT) 0.06 % nasal spray 1-2 puffs each nostril twice daily as needed 15 mL 2  . losartan (COZAAR) 100 MG tablet Take 1 tablet (100 mg total) by mouth daily. 90 tablet 1  . Multiple Vitamin (MULTIVITAMIN) tablet Take 1 tablet by mouth daily.      . Omega 3-6-9 Fatty Acids (OMEGA 3-6-9 COMPLEX PO) Take 1 capsule by mouth daily.      Marland Kitchen omeprazole (PRILOSEC) 20 MG capsule Take 1 capsule (20 mg total) by mouth daily. 90 capsule 3  . ranitidine (ZANTAC) 300 MG tablet Take 1 tablet (300 mg total) by mouth at bedtime. 30 tablet 3  . Vitamin D, Ergocalciferol, (DRISDOL) 50000 units CAPS capsule Take 1 capsule (50,000 Units total) by mouth every 7 (seven) days. 12 capsule 1  . furosemide (LASIX) 40 MG tablet Take 1 tablet (40 mg total) by mouth daily. 90 tablet 3  . amLODipine (NORVASC) 2.5 MG tablet Take 1 tablet (2.5 mg total) by mouth daily. 90 tablet 3   No facility-administered medications prior to visit.     Allergies  Allergen Reactions  . Codeine Nausea And Vomiting  . Lisinopril-Hydrochlorothiazide Hives    REACTION: lip swelling,facial swelling,rash.  . Metoprolol Other  (See Comments)    Does not feel well on it   .  Other     Patient states she is allergic to Shrimp, egg whites, tomatoes and dairy products.  Says they make her feel unwell but do not cause hives, SOB or anaphylaxis.  She just avoids eating them because they can make her have a headache.    Review of Systems  Constitutional: Negative for fever and malaise/fatigue.  HENT: Positive for congestion and sinus pain.   Eyes: Negative for blurred vision.  Respiratory: Positive for sputum production. Negative for shortness of breath.   Cardiovascular: Negative for chest pain, palpitations and leg swelling.  Gastrointestinal: Negative for abdominal pain, blood in stool and nausea.  Genitourinary: Negative for dysuria and frequency.  Musculoskeletal: Negative for falls.  Skin: Positive for rash.  Neurological: Negative for dizziness, loss of consciousness and headaches.  Endo/Heme/Allergies: Negative for environmental allergies.  Psychiatric/Behavioral: Negative for depression. The patient is not nervous/anxious.        Objective:    Physical Exam  Constitutional: She is oriented to person, place, and time. She appears well-developed and well-nourished. No distress.  HENT:  Head: Normocephalic and atraumatic.  Nose: Nose normal.  Eyes: Right eye exhibits no discharge. Left eye exhibits no discharge.  Neck: Normal range of motion. Neck supple.  Cardiovascular: Normal rate and regular rhythm.  No murmur heard. Pulmonary/Chest: Effort normal and breath sounds normal.  Abdominal: Soft. Bowel sounds are normal. There is no tenderness.  Musculoskeletal: She exhibits no edema.  Neurological: She is alert and oriented to person, place, and time.  Skin: Skin is warm and dry.  Psychiatric: She has a normal mood and affect.  Nursing note and vitals reviewed.   BP (!) 163/76 (BP Location: Left Arm, Patient Position: Sitting, Cuff Size: Normal)   Pulse 73   Temp 98.4 F (36.9 C) (Oral)   Resp 18    Wt 223 lb (101.2 kg)   SpO2 98%   BMI 43.55 kg/m  Wt Readings from Last 3 Encounters:  04/11/18 223 lb (101.2 kg)  03/31/18 227 lb (103 kg)  01/09/18 226 lb 9.6 oz (102.8 kg)   BP Readings from Last 3 Encounters:  04/11/18 (!) 163/76  03/31/18 (!) 150/82  02/07/18 (!) 148/81     Immunization History  Administered Date(s) Administered  . Influenza, High Dose Seasonal PF 07/15/2013  . Influenza,inj,Quad PF,6+ Mos 08/29/2015  . Pneumococcal Conjugate-13 11/30/2015  . Pneumococcal Polysaccharide-23 02/26/2013  . Td 09/25/2003  . Tdap 08/29/2015    Health Maintenance  Topic Date Due  . INFLUENZA VACCINE  07/10/2018 (Originally 04/24/2018)  . MAMMOGRAM  01/10/2019 (Originally 11/21/2017)  . COLONOSCOPY  10/22/2019  . TETANUS/TDAP  08/28/2025  . DEXA SCAN  Completed  . Hepatitis C Screening  Completed  . PNA vac Low Risk Adult  Completed    Lab Results  Component Value Date   WBC 8.8 03/31/2018   HGB 12.6 03/31/2018   HCT 37.9 03/31/2018   PLT 279.0 03/31/2018   GLUCOSE 98 03/31/2018   CHOL 174 03/31/2018   TRIG 51.0 03/31/2018   HDL 43.50 03/31/2018   LDLCALC 120 (H) 03/31/2018   ALT 12 03/31/2018   AST 13 03/31/2018   NA 142 03/31/2018   K 3.7 03/31/2018   CL 105 03/31/2018   CREATININE 0.81 03/31/2018   BUN 17 03/31/2018   CO2 29 03/31/2018   TSH 2.15 03/31/2018   INR 1.06 01/10/2015   HGBA1C 5.5 03/31/2018    Lab Results  Component Value Date   TSH 2.15 03/31/2018  Lab Results  Component Value Date   WBC 8.8 03/31/2018   HGB 12.6 03/31/2018   HCT 37.9 03/31/2018   MCV 89.8 03/31/2018   PLT 279.0 03/31/2018   Lab Results  Component Value Date   NA 142 03/31/2018   K 3.7 03/31/2018   CO2 29 03/31/2018   GLUCOSE 98 03/31/2018   BUN 17 03/31/2018   CREATININE 0.81 03/31/2018   BILITOT 0.4 03/31/2018   ALKPHOS 75 03/31/2018   AST 13 03/31/2018   ALT 12 03/31/2018   PROT 6.9 03/31/2018   ALBUMIN 3.9 03/31/2018   CALCIUM 9.1 03/31/2018    ANIONGAP 9 09/15/2017   GFR 89.76 03/31/2018   Lab Results  Component Value Date   CHOL 174 03/31/2018   Lab Results  Component Value Date   HDL 43.50 03/31/2018   Lab Results  Component Value Date   LDLCALC 120 (H) 03/31/2018   Lab Results  Component Value Date   TRIG 51.0 03/31/2018   Lab Results  Component Value Date   CHOLHDL 4 03/31/2018   Lab Results  Component Value Date   HGBA1C 5.5 03/31/2018         Assessment & Plan:   Problem List Items Addressed This Visit    Obstructive sleep apnea    Struggles to use CPAP she wakes up with it on her chest regularly and not on her face.       Essential hypertension    Not well controlled, no changes to meds. Encouraged heart healthy diet such as the DASH diet and exercise as tolerated. Increase the Amlodipine to 2.5 mg bid      Relevant Medications   amLODipine (NORVASC) 2.5 MG tablet   Other Relevant Orders   CBC   Comprehensive metabolic panel   Lipid panel   TSH   GERD    Avoid offending foods, start probiotics. Do not eat large meals in late evening and consider raising head of bed.       Allergic urticaria    For hives: Zyrtec/Cetirizine 10 mg tabs and Zantac/Ranitidine 150 mg tabs both twice daily when hives occur      Acute combined systolic and diastolic heart failure (HCC)    No recent exacerbation      Relevant Medications   amLODipine (NORVASC) 2.5 MG tablet   Hyperglycemia    hgba1c acceptable, minimize simple carbs. Increase exercise as tolerated.       Relevant Orders   Hemoglobin A1c   Lipid panel   Vitamin D deficiency    Supplement, monitor      Obesity    Encouraged DASH diet, decrease po intake and increase exercise as tolerated. Needs 7-8 hours of sleep nightly. Avoid trans fats, eat small, frequent meals every 4-5 hours with lean proteins, complex carbs and healthy fats. Minimize simple carbs         I have changed Anne Barnes's amLODipine. I am also having her  maintain her multivitamin, Vitamin D, Omega 3-6-9 Fatty Acids (OMEGA 3-6-9 COMPLEX PO), cetirizine, b complex vitamins, omeprazole, furosemide, ipratropium, Vitamin D (Ergocalciferol), losartan, albuterol, and ranitidine.  Meds ordered this encounter  Medications  . amLODipine (NORVASC) 2.5 MG tablet    Sig: Take 1 tablet (2.5 mg total) by mouth 2 (two) times daily.    Dispense:  180 tablet    Refill:  1    CMA served as scribe during this visit. History, Physical and Plan performed by medical provider. Documentation and orders reviewed and  attested to.  Penni Homans, MD

## 2018-04-12 DIAGNOSIS — G4733 Obstructive sleep apnea (adult) (pediatric): Secondary | ICD-10-CM | POA: Diagnosis not present

## 2018-04-13 NOTE — Assessment & Plan Note (Signed)
Avoid offending foods, start probiotics. Do not eat large meals in late evening and consider raising head of bed.  

## 2018-04-13 NOTE — Assessment & Plan Note (Signed)
No recent exacerbation 

## 2018-04-13 NOTE — Assessment & Plan Note (Signed)
For hives: Zyrtec/Cetirizine 10 mg tabs and Zantac/Ranitidine 150 mg tabs both twice daily when hives occur

## 2018-04-13 NOTE — Assessment & Plan Note (Signed)
Encouraged DASH diet, decrease po intake and increase exercise as tolerated. Needs 7-8 hours of sleep nightly. Avoid trans fats, eat small, frequent meals every 4-5 hours with lean proteins, complex carbs and healthy fats. Minimize simple carbs 

## 2018-05-13 ENCOUNTER — Other Ambulatory Visit: Payer: Self-pay | Admitting: Family Medicine

## 2018-05-13 DIAGNOSIS — G4733 Obstructive sleep apnea (adult) (pediatric): Secondary | ICD-10-CM | POA: Diagnosis not present

## 2018-05-14 ENCOUNTER — Encounter: Payer: Self-pay | Admitting: Cardiology

## 2018-05-14 ENCOUNTER — Ambulatory Visit: Payer: Medicare HMO | Admitting: Cardiology

## 2018-05-14 ENCOUNTER — Encounter

## 2018-05-14 VITALS — BP 150/80 | HR 75 | Ht 59.0 in | Wt 226.2 lb

## 2018-05-14 DIAGNOSIS — I951 Orthostatic hypotension: Secondary | ICD-10-CM

## 2018-05-14 DIAGNOSIS — R600 Localized edema: Secondary | ICD-10-CM | POA: Diagnosis not present

## 2018-05-14 DIAGNOSIS — I10 Essential (primary) hypertension: Secondary | ICD-10-CM

## 2018-05-14 MED ORDER — HYDRALAZINE HCL 25 MG PO TABS: 25.0000 mg | ORAL_TABLET | Freq: Three times a day (TID) | ORAL | 4 refills | Status: DC | PRN

## 2018-05-14 NOTE — Progress Notes (Signed)
Cardiology Office Note    Date:  05/14/2018   ID:  ALTAGRACIA RONE, DOB 1945-10-11, MRN [id number]  PCP:  Mosie Lukes, MD  Cardiologist:  Ena Dawley, MD   Chief complaint: Orthostatic hypotension, dizziness, lower extremity edema.  History of Present Illness:  JAMARI DIANA is a 72 y.o. female was seen in June 2018 for bradycardia down to low 50s while on beta blocker. The patient was asymptomatic at the time and states that she has always had slow heart rate. Pt with hx of HTN, hyperglycemia, mild HLD, osteoarthritis, tinnitus, IBS, OSA intolerant to CPAP and headaches thought to be allergies.  Dr. Annamaria Boots follows her sleep apnea.     Echo done 02/2017 EF was 60-65% no RWMA,  No significant valvular abnormalities.  05/14/2018 -the patient is coming for follow-up states that she has been feeling well no chest pain shortness of breath occasional lower extremity edema controlled by Lasix 40 mg daily.  She has had few episodes of blood pressure in 180s 190s that is associated with headache and profound fatigue for about a day.  Her primary care physician increase her amlodipine to 2.5 mg p.o. twice daily.  Thankfully this only happens once every couple of months.  She denies any palpitation dizziness or syncope.   Past Medical History:  Diagnosis Date  . Acute combined systolic and diastolic heart failure ([place]) 05/26/2015  . ALLERGIC RHINITIS   . Allergic urticaria 08/01/2014   Patient reports allergies to shrimp, egg whites, tomatoes, dairy   . 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   Office Spirometry 11/07/17-WNL-FVC 1.58/82%, FEV1 1.27/85%, ratio 0.80, FEF 25-75% 0.26/89%  . Bradycardia 02/25/2017  . Chiari malformation    Noted MRI brain 09/2013 - s/p neuro eval for same  . DIVERTICULOSIS, COLON   . Essential hypertension 08/28/2010   Qualifier: Diagnosis of  By: Marca Ancona RMA, Lucy     . Frequent headaches   . GERD   . H/O measles   . H/O mumps   . Hearing loss 01/18/2017  . History of  chicken pox   . Hyperglycemia 03/05/2016  . HYPERTENSION   . Hypocalcemia 02/25/2017  . IBS (irritable bowel syndrome) 05/26/2015  . Nonspecific abnormal electrocardiogram (ECG) (EKG)   . OBSTRUCTIVE SLEEP APNEA 12/2008 dx   noncompliant with CPAP qhs  . Obstructive sleep apnea 08/28/2010   NPSG 12/2008:  AHI 13/hr with desats to 78%   . Preventative health care 06/11/2017  . Sciatica    right side  . Seasonal and perennial allergic rhinitis 08/28/2010   Allergy profile 03/11/14- positive especially for dust, cat and dog Food allergy profile- total IgE 86 with several food group elevations Sed rate-WNL     . Sinusitis 06/06/2017  . 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 Vaginitis 01/18/2017  . Vitamin D deficiency 06/11/2017    Past Surgical History:  Procedure Laterality Date  . ABDOMINAL HYSTERECTOMY  1986  . BREAST SURGERY  1973   Breast biopsy  . UMBILICAL HERNIA REPAIR      Current Medications: Outpatient Medications Prior to Visit  Medication Sig Dispense Refill  . albuterol (PROVENTIL HFA;VENTOLIN HFA) 108 (90 Base) MCG/ACT inhaler Inhale 2 puffs into the lungs every 6 (six) hours as needed for wheezing or shortness of breath. 1 Inhaler 6  . amLODipine (NORVASC) 2.5 MG tablet Take 1 tablet (2.5 mg total) by mouth 2 (two) times daily. 180 tablet 1  . b complex vitamins tablet Take 1 tablet by mouth  daily.    . cetirizine (ZYRTEC) 10 MG tablet Take 1 tablet (10 mg total) by mouth daily. 30 tablet 11  . Cholecalciferol (VITAMIN D) 2000 UNITS CAPS Take by mouth daily.     Marland Kitchen ipratropium (ATROVENT) 0.06 % nasal spray 1-2 puffs each nostril twice daily as needed 15 mL 2  . losartan (COZAAR) 100 MG tablet Take 1 tablet (100 mg total) by mouth daily. 90 tablet 1  . Multiple Vitamin (MULTIVITAMIN) tablet Take 1 tablet by mouth daily.      . Omega 3-6-9 Fatty Acids (OMEGA 3-6-9 COMPLEX PO) Take 1 capsule by mouth daily.      Marland Kitchen omeprazole (PRILOSEC) 20 MG capsule TAKE 1 CAPSULE BY MOUTH ONCE DAILY  90 capsule 3  . ranitidine (ZANTAC) 300 MG tablet Take 1 tablet (300 mg total) by mouth at bedtime. 30 tablet 3  . Vitamin D, Ergocalciferol, (DRISDOL) 50000 units CAPS capsule Take 1 capsule (50,000 Units total) by mouth every 7 (seven) days. 12 capsule 1  . furosemide (LASIX) 40 MG tablet Take 1 tablet (40 mg total) by mouth daily. 90 tablet 3   No facility-administered medications prior to visit.      Allergies:   Codeine; Lisinopril-hydrochlorothiazide; Metoprolol; and Other   Social History   Socioeconomic History  . Marital status: Married    Spouse name: Not on file  . Number of children: Not on file  . Years of education: Not on file  . Highest education level: Not on file  Occupational History  . Occupation: retired  Scientific laboratory technician  . Financial resource strain: Not on file  . Food insecurity:    Worry: Not on file    Inability: Not on file  . Transportation needs:    Medical: Not on file    Non-medical: Not on file  Tobacco Use  . Smoking status: Never Smoker  . Smokeless tobacco: Never Used  . Tobacco comment: Married, lives with spouse, dtr 2 g-kids.  Substance and Sexual Activity  . Alcohol use: No    Comment: rare  . Drug use: No  . Sexual activity: Not Currently    Comment: lives with husband, no dietary restrictions avoid foods allergic to, retired  Lifestyle  . Physical activity:    Days per week: Not on file    Minutes per session: Not on file  . Stress: Not on file  Relationships  . Social connections:    Talks on phone: Not on file    Gets together: Not on file    Attends religious service: Not on file    Active member of club or organization: Not on file    Attends meetings of clubs or organizations: Not on file    Relationship status: Not on file  Other Topics Concern  . Not on file  Social History Narrative  . Not on file     Family History:  The patient's family history includes Alcohol abuse in her brother and other; Arthritis in her  father and mother; Diabetes in her brother; Fibromyalgia in her daughter; Berenice Primas' disease in her daughter; Heart attack in her brother; Heart disease in her brother and father; Hypertension in her daughter, daughter, maternal grandfather, and sister; Kidney disease in her father, sister, and sister; Lung cancer in her brother; Lupus in her daughter; Obesity in her sister; Pneumonia in her brother and father; Rheum arthritis in her daughter; Sjogren's syndrome in her daughter; Throat cancer in her brother.   ROS:   Please  see the history of present illness.    ROS All other systems reviewed and are negative.   PHYSICAL EXAM:   VS:  BP (!) 150/80   Pulse 75   Ht 4\' 11"  (1.499 m)   Wt 226 lb 3.2 oz (102.6 kg)   SpO2 96%   BMI 45.69 kg/m    GEN: Well nourished, well developed, in no acute distress  HEENT: normal  Neck: no JVD, carotid bruits, or masses Cardiac: RRR; no murmurs, rubs, or gallops, bilateral pitting edema up to the ankles. Respiratory:  clear to auscultation bilaterally, normal work of breathing GI: soft, nontender, nondistended, + BS MS: no deformity or atrophy  Skin: warm and dry, no rash Neuro:  Alert and Oriented x 3, Strength and sensation are intact Psych: euthymic mood, full affect  Wt Readings from Last 3 Encounters:  05/14/18 226 lb 3.2 oz (102.6 kg)  04/11/18 223 lb (101.2 kg)  03/31/18 227 lb (103 kg)      Studies/Labs Reviewed:   EKG:  EKG is not ordered today.    Recent Labs: 09/15/2017: B Natriuretic Peptide 43.6 03/31/2018: ALT 12; BUN 17; Creatinine, Ser 0.81; Hemoglobin 12.6; Platelets 279.0; Potassium 3.7; Sodium 142; TSH 2.15   Lipid Panel    Component Value Date/Time   CHOL 174 03/31/2018 0851   TRIG 51.0 03/31/2018 0851   TRIG 55 12/30/2009   HDL 43.50 03/31/2018 0851   CHOLHDL 4 03/31/2018 0851   VLDL 10.2 03/31/2018 0851   LDLCALC 120 (H) 03/31/2018 0851    Additional studies/ records that were reviewed today include:  TTE:  02/2017 - Left ventricle: The cavity size was normal. Systolic function was   normal. The estimated ejection fraction was in the range of 60%   to 65%. Wall motion was normal; there were no regional wall   motion abnormalities. There was no evidence of elevated   ventricular filling pressure by Doppler parameters.   ASSESSMENT:    1. Essential hypertension   2. Bilateral lower extremity edema   3. Orthostatic hypotension      PLAN:  In order of problems listed above:  1.  Hypertension -we will add hydralazine 25 mg p.o. 3 times daily as needed for blood pressures over 140/90 mmHg.  I am hesitant to start it around the clock as she previously was experiencing orthostatic hypotension.  At the next visit she will bring a diary of her blood pressures and frequency of hydralazine use.  We will adjust medications as needed.  2. Orthostatic hypotension -as above  3.  Bilateral lower extremity edema -controlled by Lasix 40 mg daily.  4. Sinus brady with fatigue.  Atenolol was discontinued her heart rate remained slow but with improved energy.     6. HLD - LDL 120, she wants to start conservative management, she is strongly advised to start walking a day daily starting with 1 mile.  Will check lipids and CMP at the next visit.  Current medicines are reviewed with the patient today.  The patient Has no concerns regarding medicines.  Medication Adjustments/Labs and Tests Ordered: Current medicines are reviewed at length with the patient today.  Concerns regarding medicines are outlined above.  Medication changes, Labs and Tests ordered today are listed in the Patient Instructions below. Patient Instructions  Medication Instructions:   START TAKING HYDRALAZINE 25 MG BY MOUTH THREE TIMES DAILY ONLY AS NEEDED FOR BP GREATER THAN 140/90.    Follow-Up:  4 - 5 MONTHS WITH DR Meda Coffee  If you need a refill on your cardiac medications before your next appointment, please call your  pharmacy.      Signed, Ena Dawley, MD  05/14/2018 1:11 PM    [facility] [address], [place], [place]  [address] Phone: [phone]; Fax: [phone]

## 2018-05-14 NOTE — Patient Instructions (Signed)
Medication Instructions:   START TAKING HYDRALAZINE 25 MG BY MOUTH THREE TIMES DAILY ONLY AS NEEDED FOR BP GREATER THAN 140/90.    Follow-Up:  4 - 5 MONTHS WITH DR Meda Coffee       If you need a refill on your cardiac medications before your next appointment, please call your pharmacy.

## 2018-05-16 ENCOUNTER — Ambulatory Visit (INDEPENDENT_AMBULATORY_CARE_PROVIDER_SITE_OTHER): Payer: Medicare HMO | Admitting: Family Medicine

## 2018-05-16 VITALS — BP 144/82 | HR 70

## 2018-05-16 DIAGNOSIS — I10 Essential (primary) hypertension: Secondary | ICD-10-CM | POA: Diagnosis not present

## 2018-05-16 NOTE — Progress Notes (Signed)
Pre visit review using our clinic tool,if applicable. No additional management support is needed unless otherwise documented below in the visit note.   Pt here for Blood pressure check per   Pt currently takes: Amlodipine 2.5 mg bid,Lasix 40 mg qd,Losartan 100 mg qd,Hydralizine 25 mg (for BP 140/90 or higher per Cardiologist). Patient has not taken Lasix nor Hydralizine today.  No complaints voiced this visit.   Pt reports compliance with medication. States she did not take Lasix because it makes her urinate a lot and has to drive from [place] to [facility] to see her provider.  BP today @ = 144/82 P =70  Pt advised per Dr. Frederik Pear continue medications as ordered. Take prn Hydralizine as ordered.

## 2018-05-16 NOTE — Progress Notes (Signed)
Nursing blood pressure note reviewed. Agree with documention and plan.

## 2018-06-06 ENCOUNTER — Ambulatory Visit: Payer: Medicare HMO | Admitting: Internal Medicine

## 2018-06-06 ENCOUNTER — Encounter: Payer: Self-pay | Admitting: Internal Medicine

## 2018-06-06 DIAGNOSIS — J302 Other seasonal allergic rhinitis: Secondary | ICD-10-CM | POA: Diagnosis not present

## 2018-06-06 DIAGNOSIS — J4520 Mild intermittent asthma, uncomplicated: Secondary | ICD-10-CM

## 2018-06-06 DIAGNOSIS — Z23 Encounter for immunization: Secondary | ICD-10-CM

## 2018-06-06 DIAGNOSIS — J3089 Other allergic rhinitis: Secondary | ICD-10-CM | POA: Diagnosis not present

## 2018-06-06 DIAGNOSIS — G4733 Obstructive sleep apnea (adult) (pediatric): Secondary | ICD-10-CM

## 2018-06-06 NOTE — Patient Instructions (Addendum)
We can continue CPAP auto 5-20, mask of choice, humidifier, supplies, AirView  Our goal is to be able to use CPAP all night, every night and anytime you sleep.   Order- flu vax senior  Please call if we can help

## 2018-06-06 NOTE — Assessment & Plan Note (Signed)
Well-controlled at this time.  Varies a little bit with season and weather. Plan-rescue inhaler is sufficient for now.

## 2018-06-06 NOTE — Progress Notes (Signed)
HPI female never smoker followed for rhinitis/allergies, asthma, OSA/ failed CPAP Peanut and cat cause coughing and we advised her to avoid them Allergy profile 03/11/14- positive especially for dust, cat and dog Food allergy profile- total IgE 86 with several food group elevations Sed rate-WNL Office Spirometry 11/07/17-WNL-FVC 1.58/82%, FEV1 1.27/85%, ratio 0.80, FEF 25-75% 0.26/89% HST 12/05/2017-AHI 12.3/hour, desaturation to 82%, body weight 226 pounds ----------------------------------------------------------------------------------------  11/07/17- 72 year old female never smoker followed for rhinitis/allergies, asthma, OSA ----Asthma and OSA: DME: [facility]. Pt states she has not used CPAP in a long time due to machine making her too dry. She took to [facility] and was told nothing was wrong with machine. Pt states she has been dx'd with leaking heart valve which causes SOB but allergies are fine-staying away from gluten.  CPAP Advanced- non-compliant Rescue inhaler about once a week Office Spirometry 11/07/17-WNL-FVC 1.58/82%, FEV1 1.27/85%, ratio 0.80, FEF 25-75% 0.26/89%  06/06/2018- 72 year old female never smoker followed for rhinitis/allergies, asthma, OSA HST 12/05/2017-AHI 12.3/hour, desaturation to 82%, body weight 226 pounds CPAP auto 5-20/ [facility] -----OSA: DME [facility]; Pt wears CPAP nightly and DL is attached.  Atrovent 0.06% nasal spray, Zyrtec, albuterol HFA, Doing well since she is restarted CPAP but says she is "still not quite used to it".  Using tape to keep herself from pulling mask off in her sleep.  Does sleep better with it. Download 73% compliance AHI 2.1/hour In the past week has had increased sense of postnasal drainage with little anterior rhinorrhea or sneezing.  Notes "sinus pressure".  Tries to keep diet gluten-free.  Carries Atrovent nasal spray which is some help.  Rescue inhaler is sufficient, used off and on.  Not waking with wheezing.  ROS-see HPI  + =  positive Constitutional:   No-   weight loss, night sweats, fevers, chills, fatigue, lassitude. HEENT:   +headaches, difficulty swallowing, tooth/dental problems, sore throat,       No-  sneezing, itching, ear ache, nasal congestion, +post nasal drip,  CV:  No-   chest pain, orthopnea, PND, swelling in lower extremities, anasarca,  dizziness, palpitations Resp: +shortness of breath with exertion or at rest.              productive cough,  No non-productive cough,  No- coughing up of blood.              No-   change in color of mucus.  No- wheezing.   Skin: No-   rash or lesions. GI:  No-   heartburn, indigestion, abdominal pain, nausea, vomiting,  GU: . MS:  No-   joint pain or swelling.   Neuro-     nothing unusual Psych:  No- change in mood or affect. No depression or anxiety.  No memory loss.  OBJ- Physical Exam General- Alert, Oriented, Affect-appropriate, Distress- none acute, + obese Skin- rash-none, lesions- none, excoriation- none Lymphadenopathy- none Head- atraumatic            Eyes- Gross vision intact, PERRLA, conjunctivae and secretions clear            Ears- Hearing, canals-normal            Nose- + turbinate edema, no-Septal dev, mucus, polyps, erosion, perforation             Throat- Mallampati III-IV , mucosa clear , drainage- none, tonsils- atrophic, +Dentures Neck- flexible , trachea midline, no stridor , thyroid nl, carotid no bruit Chest - symmetrical excursion , unlabored  Heart/CV- RRR , no murmur , no gallop  , no rub, nl s1 s2                           - JVD- none , edema- none, stasis changes- none, varices- none           Lung- clear to P&A, wheeze-none, cough none, dullness-none, rub- none           Chest wall-  Abd- Br/ Gen/ Rectal- Not done, not indicated Extrem- cyanosis- none, clubbing, none, atrophy- none, strength- nl Neuro- grossly intact to observation

## 2018-06-06 NOTE — Assessment & Plan Note (Signed)
Complains of increased watery rhinorrhea in the last week but does not feel sick.  Complains of postnasal drip.  I suggested she try Claritin.  Okay to continue using Atrovent nasal spray if that helps.  Flu vaccine today.

## 2018-06-06 NOTE — Assessment & Plan Note (Signed)
We discussed compliance goals and encouraged her to work towards using CPAP anytime she sleeps, all night and every night.  She does benefit from CPAP with improved sleep. Plan-continue CPAP auto 5-20

## 2018-06-13 DIAGNOSIS — G4733 Obstructive sleep apnea (adult) (pediatric): Secondary | ICD-10-CM | POA: Diagnosis not present

## 2018-06-23 ENCOUNTER — Other Ambulatory Visit: Payer: Self-pay | Admitting: Family Medicine

## 2018-07-11 ENCOUNTER — Other Ambulatory Visit: Payer: Medicare HMO

## 2018-07-13 DIAGNOSIS — G4733 Obstructive sleep apnea (adult) (pediatric): Secondary | ICD-10-CM | POA: Diagnosis not present

## 2018-07-14 ENCOUNTER — Ambulatory Visit (INDEPENDENT_AMBULATORY_CARE_PROVIDER_SITE_OTHER): Payer: Medicare HMO | Admitting: Family Medicine

## 2018-07-14 DIAGNOSIS — E785 Hyperlipidemia, unspecified: Secondary | ICD-10-CM | POA: Diagnosis not present

## 2018-07-14 DIAGNOSIS — R739 Hyperglycemia, unspecified: Secondary | ICD-10-CM

## 2018-07-14 DIAGNOSIS — I10 Essential (primary) hypertension: Secondary | ICD-10-CM | POA: Diagnosis not present

## 2018-07-14 DIAGNOSIS — J069 Acute upper respiratory infection, unspecified: Secondary | ICD-10-CM | POA: Diagnosis not present

## 2018-07-14 DIAGNOSIS — R001 Bradycardia, unspecified: Secondary | ICD-10-CM | POA: Diagnosis not present

## 2018-07-14 DIAGNOSIS — E559 Vitamin D deficiency, unspecified: Secondary | ICD-10-CM

## 2018-07-14 LAB — COMPREHENSIVE METABOLIC PANEL
ALT: 14 U/L (ref 0–35)
AST: 13 U/L (ref 0–37)
Albumin: 4 g/dL (ref 3.5–5.2)
Alkaline Phosphatase: 79 U/L (ref 39–117)
BUN: 22 mg/dL (ref 6–23)
CALCIUM: 9.3 mg/dL (ref 8.4–10.5)
CHLORIDE: 106 meq/L (ref 96–112)
CO2: 30 meq/L (ref 19–32)
Creatinine, Ser: 0.85 mg/dL (ref 0.40–1.20)
GFR: 84.83 mL/min (ref >60.00)
GLUCOSE: 102 mg/dL — AB (ref 70–99)
Potassium: 3.9 mEq/L (ref 3.5–5.1)
Sodium: 142 mEq/L (ref 135–145)
Total Bilirubin: 0.4 mg/dL (ref 0.2–1.2)
Total Protein: 7.1 g/dL (ref 6.0–8.3)

## 2018-07-14 LAB — HEMOGLOBIN A1C: HEMOGLOBIN A1C: 5.3 % (ref 4.6–6.5)

## 2018-07-14 LAB — CBC
HCT: 39.6 % (ref 36.0–46.0)
Hemoglobin: 13 g/dL (ref 12.0–15.0)
MCHC: 32.9 g/dL (ref 30.0–36.0)
MCV: 90.6 fl (ref 78.0–100.0)
PLATELETS: 268 10*3/uL (ref 150.0–400.0)
RBC: 4.37 Mil/uL (ref 3.87–5.11)
RDW: 13.8 % (ref 11.5–15.5)
WBC: 10.1 10*3/uL (ref 4.0–10.5)

## 2018-07-14 LAB — LIPID PANEL
CHOL/HDL RATIO: 4
Cholesterol: 155 mg/dL (ref 0–200)
HDL: 42 mg/dL (ref >39.00)
LDL Cholesterol: 104 mg/dL — ABNORMAL HIGH (ref 0–99)
NonHDL: 112.83
TRIGLYCERIDES: 46 mg/dL (ref 0.0–149.0)
VLDL: 9.2 mg/dL (ref 0.0–40.0)

## 2018-07-14 LAB — VITAMIN D 25 HYDROXY (VIT D DEFICIENCY, FRACTURES): VITD: 24.99 ng/mL — ABNORMAL LOW (ref 30.00–100.00)

## 2018-07-14 LAB — TSH: TSH: 1.11 u[IU]/mL (ref 0.35–4.50)

## 2018-07-14 NOTE — Assessment & Plan Note (Signed)
RRR today 

## 2018-07-14 NOTE — Assessment & Plan Note (Signed)
hgba1c acceptable, minimize simple carbs. Increase exercise as tolerated.  

## 2018-07-14 NOTE — Assessment & Plan Note (Signed)
Well controlled, no changes to meds. Encouraged heart healthy diet such as the DASH diet and exercise as tolerated.  °

## 2018-07-14 NOTE — Assessment & Plan Note (Signed)
Supplement and monitor 

## 2018-07-14 NOTE — Assessment & Plan Note (Signed)
Encouraged heart healthy diet, increase exercise, avoid trans fats, consider a krill oil cap daily 

## 2018-07-14 NOTE — Assessment & Plan Note (Signed)
Encouraged increased rest and hydration, add probiotics, zinc such as Coldeze or Xicam. Treat fevers as needed, elderberry, mucinex. Has only been a few days. She will let us know if worsens

## 2018-07-14 NOTE — Patient Instructions (Addendum)
Shingrix is the new shingles shot, 2 shots over 2-6 months   For the cold. Encouraged increased rest and hydration, add probiotics, zinc such as Coldeze or Xicam. Treat fevers as needed. Elderberry, vitamin c [phone] mg, mucinex twice daily. Call if worsens.   Carbohydrate Counting for Diabetes Mellitus, Adult Carbohydrate counting is a method for keeping track of how many carbohydrates you eat. Eating carbohydrates naturally increases the amount of sugar (glucose) in the blood. Counting how many carbohydrates you eat helps keep your blood glucose within normal limits, which helps you manage your diabetes (diabetes mellitus). It is important to know how many carbohydrates you can safely have in each meal. This is different for every person. A diet and nutrition specialist (registered dietitian) can help you make a meal plan and calculate how many carbohydrates you should have at each meal and snack. Carbohydrates are found in the following foods:  Grains, such as breads and cereals.  Dried beans and soy products.  Starchy vegetables, such as potatoes, peas, and corn.  Fruit and fruit juices.  Milk and yogurt.  Sweets and snack foods, such as cake, cookies, candy, chips, and soft drinks.  How do I count carbohydrates? There are two ways to count carbohydrates in food. You can use either of the methods or a combination of both. Reading "Nutrition Facts" on packaged food The "Nutrition Facts" list is included on the labels of almost all packaged foods and beverages in the [place] It includes:  The serving size.  Information about nutrients in each serving, including the grams (g) of carbohydrate per serving.  To use the "Nutrition Facts":  Decide how many servings you will have.  Multiply the number of servings by the number of carbohydrates per serving.  The resulting number is the total amount of carbohydrates that you will be having.  Learning standard serving sizes of other  foods When you eat foods containing carbohydrates that are not packaged or do not include "Nutrition Facts" on the label, you need to measure the servings in order to count the amount of carbohydrates:  Measure the foods that you will eat with a food scale or measuring cup, if needed.  Decide how many standard-size servings you will eat.  Multiply the number of servings by 15. Most carbohydrate-rich foods have about 15 g of carbohydrates per serving. ? For example, if you eat 8 oz (170 g) of strawberries, you will have eaten 2 servings and 30 g of carbohydrates (2 servings x 15 g = 30 g).  For foods that have more than one food mixed, such as soups and casseroles, you must count the carbohydrates in each food that is included.  The following list contains standard serving sizes of common carbohydrate-rich foods. Each of these servings has about 15 g of carbohydrates:   hamburger bun or  English muffin.   oz (15 mL) syrup.   oz (14 g) jelly.  1 slice of bread.  1 six-inch tortilla.  3 oz (85 g) cooked rice or pasta.  4 oz (113 g) cooked dried beans.  4 oz (113 g) starchy vegetable, such as peas, corn, or potatoes.  4 oz (113 g) hot cereal.  4 oz (113 g) mashed potatoes or  of a large baked potato.  4 oz (113 g) canned or frozen fruit.  4 oz (120 mL) fruit juice.  4-6 crackers.  6 chicken nuggets.  6 oz (170 g) unsweetened dry cereal.  6 oz (170 g) plain fat-free yogurt  or yogurt sweetened with artificial sweeteners.  8 oz (240 mL) milk.  8 oz (170 g) fresh fruit or one small piece of fruit.  24 oz (680 g) popped popcorn.  Example of carbohydrate counting Sample meal  3 oz (85 g) chicken breast.  6 oz (170 g) brown rice.  4 oz (113 g) corn.  8 oz (240 mL) milk.  8 oz (170 g) strawberries with sugar-free whipped topping. Carbohydrate calculation 1. Identify the foods that contain  carbohydrates: ? Rice. ? Corn. ? Milk. ? Strawberries. 2. Calculate how many servings you have of each food: ? 2 servings rice. ? 1 serving corn. ? 1 serving milk. ? 1 serving strawberries. 3. Multiply each number of servings by 15 g: ? 2 servings rice x 15 g = 30 g. ? 1 serving corn x 15 g = 15 g. ? 1 serving milk x 15 g = 15 g. ? 1 serving strawberries x 15 g = 15 g. 4. Add together all of the amounts to find the total grams of carbohydrates eaten: ? 30 g + 15 g + 15 g + 15 g = 75 g of carbohydrates total. This information is not intended to replace advice given to you by your health care provider. Make sure you discuss any questions you have with your health care provider. Document Released: 09/10/2005 Document Revised: 03/30/2016 Document Reviewed: 02/22/2016 Elsevier Interactive Patient Education  [employer].

## 2018-07-14 NOTE — Progress Notes (Signed)
Subjective:    Patient ID: Anne Barnes, female    DOB: August 02, 1946, 72 y.o.   MRN: [id number]  No chief complaint on file.   HPI Patient is in today for follow-up.  She is mostly noting fatigue.  For her husband whose health is been going down and he is currently in rehab.  She finds herself exhausted and he is coming home today.  She is also noted to have cold over the last 3 days.  She has some hoarseness and congestion.  She denies is or chills worsening malaise or myalgias.  No polyuria or polydipsia.  No symptoms in her chest although she does note a mild cough and irritation in her throat. Denies CP/palp/SOB/HA/fevers/GI or GU c/o. Taking meds as prescribed  Past Medical History:  Diagnosis Date  . Acute combined systolic and diastolic heart failure ([place]) 05/26/2015  . ALLERGIC RHINITIS   . Allergic urticaria 08/01/2014   Patient reports allergies to shrimp, egg whites, tomatoes, dairy   . 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   Office Spirometry 11/07/17-WNL-FVC 1.58/82%, FEV1 1.27/85%, ratio 0.80, FEF 25-75% 0.26/89%  . Bradycardia 02/25/2017  . Chiari malformation    Noted MRI brain 09/2013 - s/p neuro eval for same  . DIVERTICULOSIS, COLON   . Essential hypertension 08/28/2010   Qualifier: Diagnosis of  By: Marca Ancona RMA, Lucy     . Frequent headaches   . GERD   . H/O measles   . H/O mumps   . Hearing loss 01/18/2017  . History of chicken pox   . Hyperglycemia 03/05/2016  . HYPERTENSION   . Hypocalcemia 02/25/2017  . IBS (irritable bowel syndrome) 05/26/2015  . Nonspecific abnormal electrocardiogram (ECG) (EKG)   . OBSTRUCTIVE SLEEP APNEA 12/2008 dx   noncompliant with CPAP qhs  . Obstructive sleep apnea 08/28/2010   NPSG 12/2008:  AHI 13/hr with desats to 78%   . Preventative health care 06/11/2017  . Sciatica    right side  . Seasonal and perennial allergic rhinitis 08/28/2010   Allergy profile 03/11/14- positive especially for dust, cat and dog Food allergy profile- total IgE 86 with  several food group elevations Sed rate-WNL     . Sinusitis 06/06/2017  . 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 Vaginitis 01/18/2017  . Vitamin D deficiency 06/11/2017    Past Surgical History:  Procedure Laterality Date  . ABDOMINAL HYSTERECTOMY  1986  . BREAST SURGERY  1973   Breast biopsy  . UMBILICAL HERNIA REPAIR      Family History  Problem Relation Age of Onset  . Arthritis Mother        died of complications from hip surgery  . Arthritis Father   . Kidney disease Father   . Pneumonia Father   . Heart disease Father   . Lupus Daughter   . Rheum arthritis Daughter   . Sjogren's syndrome Daughter   . Fibromyalgia Daughter   . Diabetes Brother   . Heart disease Brother   . Heart attack Brother   . Alcohol abuse Brother   . Throat cancer Brother   . Lung cancer Brother   . Kidney disease Sister        dialysis  . Obesity Sister   . Hypertension Maternal Grandfather   . Hypertension Sister   . Kidney disease Sister        dialysis  . Pneumonia Brother   . Hypertension Daughter   . Graves' disease Daughter   . Hypertension Daughter   .  Alcohol abuse Other        parent  . Ataxia Neg Hx   . Chorea Neg Hx   . Dementia Neg Hx   . Mental retardation Neg Hx   . Migraines Neg Hx   . Multiple sclerosis Neg Hx   . Neurofibromatosis Neg Hx   . Neuropathy Neg Hx   . Parkinsonism Neg Hx   . Seizures Neg Hx   . Stroke Neg Hx     Social History   Socioeconomic History  . Marital status: Married    Spouse name: Not on file  . Number of children: Not on file  . Years of education: Not on file  . Highest education level: Not on file  Occupational History  . Occupation: retired  Scientific laboratory technician  . Financial resource strain: Not on file  . Food insecurity:    Worry: Not on file    Inability: Not on file  . Transportation needs:    Medical: Not on file    Non-medical: Not on file  Tobacco Use  . Smoking status: Never Smoker  . Smokeless tobacco: Never Used  .  Tobacco comment: Married, lives with spouse, dtr 2 g-kids.  Substance and Sexual Activity  . Alcohol use: No    Comment: rare  . Drug use: No  . Sexual activity: Not Currently    Comment: lives with husband, no dietary restrictions avoid foods allergic to, retired  Lifestyle  . Physical activity:    Days per week: Not on file    Minutes per session: Not on file  . Stress: Not on file  Relationships  . Social connections:    Talks on phone: Not on file    Gets together: Not on file    Attends religious service: Not on file    Active member of club or organization: Not on file    Attends meetings of clubs or organizations: Not on file    Relationship status: Not on file  . Intimate partner violence:    Fear of current or ex partner: Not on file    Emotionally abused: Not on file    Physically abused: Not on file    Forced sexual activity: Not on file  Other Topics Concern  . Not on file  Social History Narrative  . Not on file    Outpatient Medications Prior to Visit  Medication Sig Dispense Refill  . albuterol (PROVENTIL HFA;VENTOLIN HFA) 108 (90 Base) MCG/ACT inhaler Inhale 2 puffs into the lungs every 6 (six) hours as needed for wheezing or shortness of breath. 1 Inhaler 6  . b complex vitamins tablet Take 1 tablet by mouth daily.    . cetirizine (ZYRTEC) 10 MG tablet Take 1 tablet (10 mg total) by mouth daily. 30 tablet 11  . Cholecalciferol (VITAMIN D) 2000 UNITS CAPS Take by mouth daily.     . hydrALAZINE (APRESOLINE) 25 MG tablet Take 1 tablet (25 mg total) by mouth 3 (three) times daily as needed. Take as needed for BP greater than 140/90. 90 tablet 4  . ipratropium (ATROVENT) 0.06 % nasal spray 1-2 puffs each nostril twice daily as needed 15 mL 2  . losartan (COZAAR) 100 MG tablet TAKE 1 TABLET BY MOUTH ONCE DAILY 90 tablet 1  . Multiple Vitamin (MULTIVITAMIN) tablet Take 1 tablet by mouth daily.      . Omega 3-6-9 Fatty Acids (OMEGA 3-6-9 COMPLEX PO) Take 1 capsule by  mouth daily.      Marland Kitchen  omeprazole (PRILOSEC) 20 MG capsule TAKE 1 CAPSULE BY MOUTH ONCE DAILY 90 capsule 3  . ranitidine (ZANTAC) 300 MG tablet Take 1 tablet (300 mg total) by mouth at bedtime. 30 tablet 3  . Vitamin D, Ergocalciferol, (DRISDOL) 50000 units CAPS capsule Take 1 capsule (50,000 Units total) by mouth every 7 (seven) days. 12 capsule 1  . amLODipine (NORVASC) 2.5 MG tablet Take 1 tablet (2.5 mg total) by mouth 2 (two) times daily. 180 tablet 1  . furosemide (LASIX) 40 MG tablet Take 1 tablet (40 mg total) by mouth daily. 90 tablet 3   No facility-administered medications prior to visit.     Allergies  Allergen Reactions  . Codeine Nausea And Vomiting  . Lisinopril-Hydrochlorothiazide Hives    REACTION: lip swelling,facial swelling,rash.  . Metoprolol Other (See Comments)    Does not feel well on it   . Other     Patient states she is allergic to Shrimp, egg whites, tomatoes and dairy products.  Says they make her feel unwell but do not cause hives, SOB or anaphylaxis.  She just avoids eating them because they can make her have a headache.    Review of Systems  Constitutional: Positive for malaise/fatigue. Negative for fever.  HENT: Positive for congestion.   Eyes: Negative for blurred vision.  Respiratory: Positive for cough. Negative for shortness of breath.   Cardiovascular: Negative for chest pain, palpitations and leg swelling.  Gastrointestinal: Negative for abdominal pain, blood in stool and nausea.  Genitourinary: Negative for dysuria and frequency.  Musculoskeletal: Negative for falls.  Skin: Negative for rash.  Neurological: Negative for dizziness, loss of consciousness and headaches.  Endo/Heme/Allergies: Negative for environmental allergies.  Psychiatric/Behavioral: Negative for depression. The patient is not nervous/anxious.        Objective:    Physical Exam  Constitutional: She is oriented to person, place, and time. She appears well-developed and  well-nourished. No distress.  HENT:  Head: Normocephalic and atraumatic.  Nose: Nose normal.  Eyes: Right eye exhibits no discharge. Left eye exhibits no discharge.  Neck: Normal range of motion. Neck supple.  Cardiovascular: Normal rate and regular rhythm.  No murmur heard. Pulmonary/Chest: Effort normal and breath sounds normal.  Abdominal: Soft. Bowel sounds are normal. There is no tenderness.  Musculoskeletal: She exhibits no edema.  Neurological: She is alert and oriented to person, place, and time.  Skin: Skin is warm and dry.  Psychiatric: She has a normal mood and affect.  Nursing note and vitals reviewed.   BP 140/66   Pulse 85   Temp 97.9 F (36.6 C) (Oral)   Resp 18   Ht 4\' 11"  (1.499 m)   Wt 224 lb 12.8 oz (102 kg)   SpO2 98%   BMI 45.40 kg/m  Wt Readings from Last 3 Encounters:  07/14/18 224 lb 12.8 oz (102 kg)  06/06/18 224 lb 12.8 oz (102 kg)  05/14/18 226 lb 3.2 oz (102.6 kg)     Lab Results  Component Value Date   WBC 8.8 03/31/2018   HGB 12.6 03/31/2018   HCT 37.9 03/31/2018   PLT 279.0 03/31/2018   GLUCOSE 98 03/31/2018   CHOL 174 03/31/2018   TRIG 51.0 03/31/2018   HDL 43.50 03/31/2018   LDLCALC 120 (H) 03/31/2018   ALT 12 03/31/2018   AST 13 03/31/2018   NA 142 03/31/2018   K 3.7 03/31/2018   CL 105 03/31/2018   CREATININE 0.81 03/31/2018   BUN 17 03/31/2018  CO2 29 03/31/2018   TSH 2.15 03/31/2018   INR 1.06 01/10/2015   HGBA1C 5.5 03/31/2018    Lab Results  Component Value Date   TSH 2.15 03/31/2018   Lab Results  Component Value Date   WBC 8.8 03/31/2018   HGB 12.6 03/31/2018   HCT 37.9 03/31/2018   MCV 89.8 03/31/2018   PLT 279.0 03/31/2018   Lab Results  Component Value Date   NA 142 03/31/2018   K 3.7 03/31/2018   CO2 29 03/31/2018   GLUCOSE 98 03/31/2018   BUN 17 03/31/2018   CREATININE 0.81 03/31/2018   BILITOT 0.4 03/31/2018   ALKPHOS 75 03/31/2018   AST 13 03/31/2018   ALT 12 03/31/2018   PROT 6.9  03/31/2018   ALBUMIN 3.9 03/31/2018   CALCIUM 9.1 03/31/2018   ANIONGAP 9 09/15/2017   GFR 89.76 03/31/2018   Lab Results  Component Value Date   CHOL 174 03/31/2018   Lab Results  Component Value Date   HDL 43.50 03/31/2018   Lab Results  Component Value Date   LDLCALC 120 (H) 03/31/2018   Lab Results  Component Value Date   TRIG 51.0 03/31/2018   Lab Results  Component Value Date   CHOLHDL 4 03/31/2018   Lab Results  Component Value Date   HGBA1C 5.5 03/31/2018       Assessment & Plan:   Problem List Items Addressed This Visit    Essential hypertension    Well controlled, no changes to meds. Encouraged heart healthy diet such as the DASH diet and exercise as tolerated.       Relevant Orders   CBC   Comprehensive metabolic panel   TSH   Hyperglycemia    hgba1c acceptable, minimize simple carbs. Increase exercise as tolerated.       Relevant Orders   Hemoglobin A1c   Bradycardia    RRR today      Vitamin D deficiency    Supplement and monitor      Relevant Orders   VITAMIN D 25 Hydroxy (Vit-D Deficiency, Fractures)   Hyperlipidemia    Encouraged heart healthy diet, increase exercise, avoid trans fats, consider a krill oil cap daily      Relevant Orders   Lipid panel   Upper respiratory infection    Encouraged increased rest and hydration, add probiotics, zinc such as Coldeze or Xicam. Treat fevers as needed, elderberry, mucinex. Has only been a few days. She will let us know if worsens         I am having Anne Barnes maintain her multivitamin, Vitamin D, Omega 3-6-9 Fatty Acids (OMEGA 3-6-9 COMPLEX PO), cetirizine, b complex vitamins, furosemide, ipratropium, Vitamin D (Ergocalciferol), albuterol, ranitidine, amLODipine, omeprazole, hydrALAZINE, and losartan.  No orders of the defined types were placed in this encounter.    Penni Homans, MD

## 2018-07-17 MED ORDER — VITAMIN D (ERGOCALCIFEROL) 1.25 MG (50000 UNIT) PO CAPS: 50000.0000 [IU] | ORAL_CAPSULE | ORAL | 1 refills | Status: DC

## 2018-07-17 NOTE — Addendum Note (Signed)
Addended by: Magdalene Molly A on: 07/17/2018 04:41 PM   Modules accepted: Orders

## 2018-07-18 MED ORDER — VITAMIN D (ERGOCALCIFEROL) 1.25 MG (50000 UNIT) PO CAPS: 50000.0000 [IU] | ORAL_CAPSULE | ORAL | 1 refills | Status: DC

## 2018-07-18 NOTE — Addendum Note (Signed)
Addended by: Magdalene Molly A on: 07/18/2018 03:42 PM   Modules accepted: Orders

## 2018-07-22 ENCOUNTER — Ambulatory Visit: Payer: Self-pay | Admitting: *Deleted

## 2018-07-22 ENCOUNTER — Other Ambulatory Visit: Payer: Self-pay | Admitting: Family Medicine

## 2018-07-22 MED ORDER — DOXYCYCLINE HYCLATE 100 MG PO TABS: 100.0000 mg | ORAL_TABLET | Freq: Two times a day (BID) | ORAL | 0 refills | Status: DC

## 2018-07-22 NOTE — Telephone Encounter (Signed)
Please let her know I sent in a prescription for Doxycycline and she should take plain Mucinex twice daily for the same 10 days. Also start probiotic daily, Encouraged increased rest and hydration, add probiotics, zinc such as Coldeze or Xicam. Treat fevers as needed

## 2018-07-22 NOTE — Telephone Encounter (Signed)
Pt called with feeling some fatigue and weakness since Friday. She has a slight sinus headache. Which she says is nothing new, she gets them frequently. She saw her provider on the [address]. She discussed taking antibiotics with the provider but decided not to take any right now. She states that she can walk ok but just cant stand for a long period of time.  Denies dehydration or fever. When questioned patient regarding her last visit, if she had labs drawn that day. She said yes, but no one ever gave me the results. Then she wanted to know if she had any new medications, because the pharmacy kept calling her regarding a medication. I let her know that she needed to get a Vitamin D, 50000 units that had been prescribed for her and to take it once every 7 days. Pt voiced understanding. She will take her medication, get some rest and let us know if she needs to be seen at the office.  Will call back for increase in symptoms. Will route to flow at [facility]. Reason for Disposition . Mild weakness or fatigue with acute minor illness (e.g., colds)  Answer Assessment - Initial Assessment Questions 1. DESCRIPTION: "Describe how you are feeling."     No engery 2. SEVERITY: "How bad is it?"  "Can you stand and walk?"   - MILD - Feels weak or tired, but does not interfere with work, school or normal activities   - [place] to stand and walk; weakness interferes with work, school, or normal activities   - SEVERE - Unable to stand or walk     mild 3. ONSET:  "When did the weakness begin?"     Friday 4. CAUSE: "What do you think is causing the weakness?"     Not sure 5. MEDICINES: "Have you recently started a new medicine or had a change in the amount of a medicine?"     no 6. OTHER SYMPTOMS: "Do you have any other symptoms?" (e.g., chest pain, fever, cough, SOB, vomiting, diarrhea, bleeding, other areas of pain)     Cough because of allergies 7. PREGNANCY: "Is there any  chance you are pregnant?" "When was your last menstrual period?"     n/a  Protocols used: WEAKNESS (GENERALIZED) AND FATIGUE-A-AH

## 2018-07-22 NOTE — Telephone Encounter (Signed)
Please advise 

## 2018-07-23 NOTE — Telephone Encounter (Signed)
Author phoned pt. to relay Dr. Frederik Pear instructions. No answer, author left detailed VM, with call back number if pt. Has questions.

## 2018-07-30 DIAGNOSIS — G4733 Obstructive sleep apnea (adult) (pediatric): Secondary | ICD-10-CM | POA: Diagnosis not present

## 2018-08-13 DIAGNOSIS — G4733 Obstructive sleep apnea (adult) (pediatric): Secondary | ICD-10-CM | POA: Diagnosis not present

## 2018-09-12 DIAGNOSIS — G4733 Obstructive sleep apnea (adult) (pediatric): Secondary | ICD-10-CM | POA: Diagnosis not present

## 2018-09-13 ENCOUNTER — Other Ambulatory Visit: Payer: Self-pay | Admitting: Cardiology

## 2018-09-17 IMAGING — CT CT HEAD W/O CM
4 of 5 series · 16 of 47 positions shown, 18 images · non-contrast
Comparison: Brain MRI 09/30/2013

CLINICAL DATA: Headache beginning last week with pain in the left
head and face. Left arm weakness.

EXAM:
CT HEAD WITHOUT CONTRAST
CT CERVICAL SPINE WITHOUT CONTRAST
TECHNIQUE: Multidetector CT imaging of the head and cervical spine was
performed following the standard protocol without intravenous
contrast. Multiplanar CT image reconstructions of the cervical spine
were also generated.

[Series 2: head 5.0 h30s · axial · 0.40mm/px · z∈[-154,-44]mm · 8 of 30 slices shown, 10 images]
[im 4/30  brain]
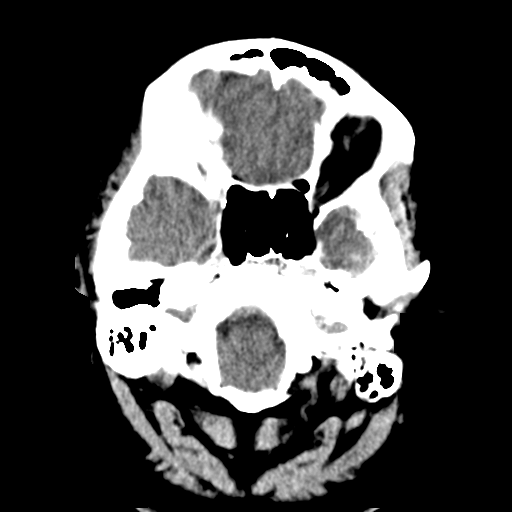
[im 4/30  bone]
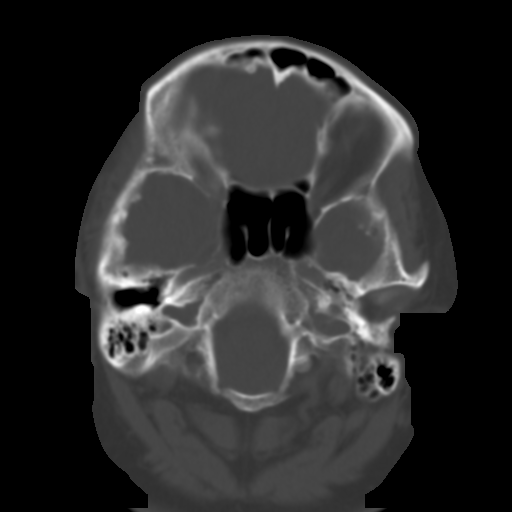
[im 7/30  brain]
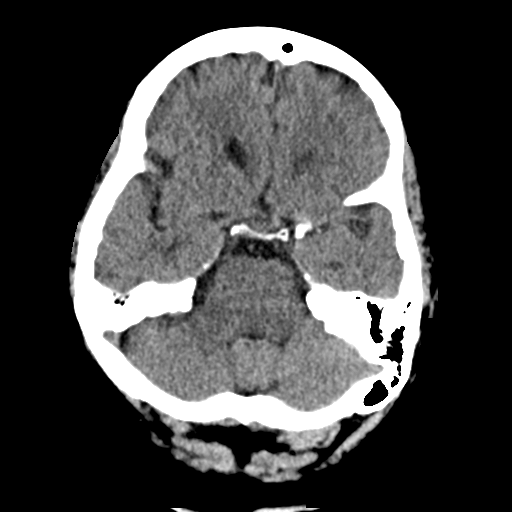
[im 10/30  brain]
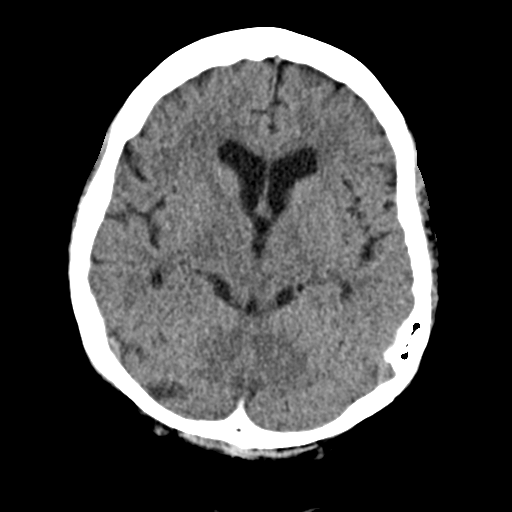
[im 13/30  brain]
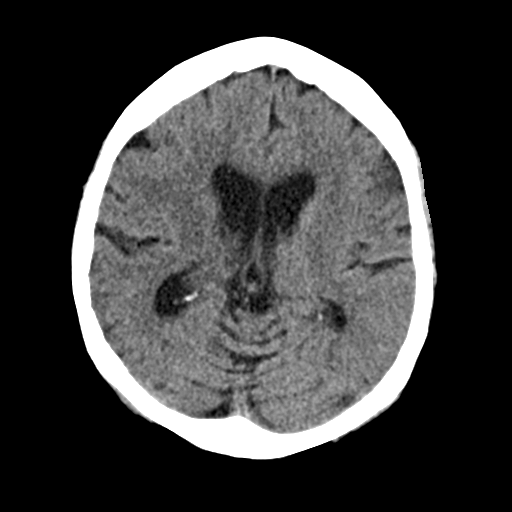
[im 17/30  brain]
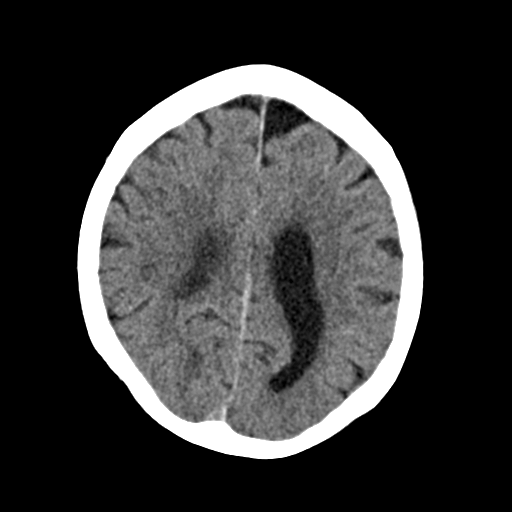
[im 17/30  bone]
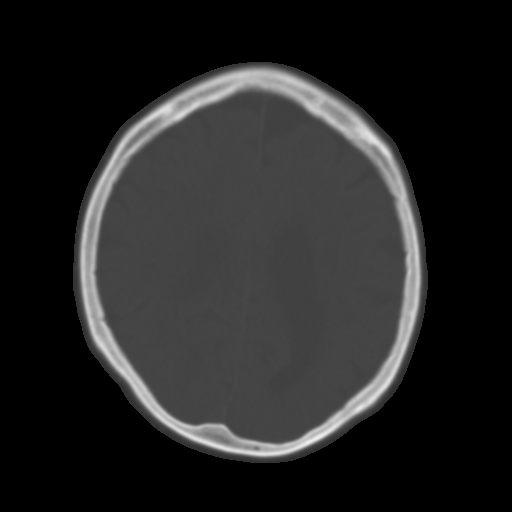
[im 20/30  brain]
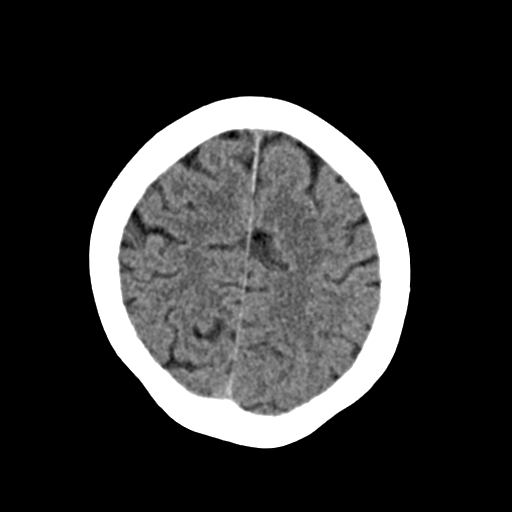
[im 23/30  brain]
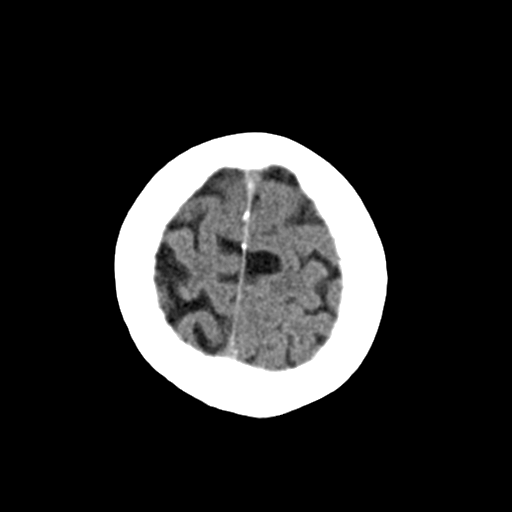
[im 26/30  brain]
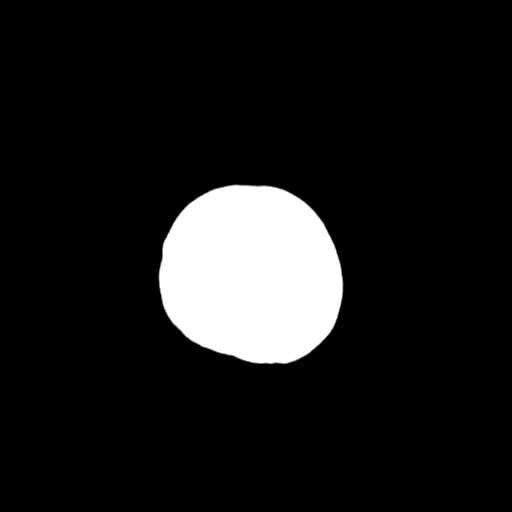

[Series 4: head 3.0 mpr cor · coronal · 0.31mm/px · 3 of 63 slices shown]
[im 21/63  brain]
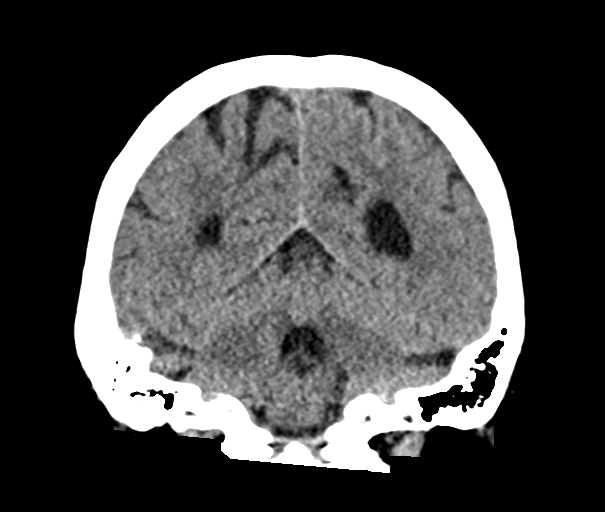
[im 28/63  brain]
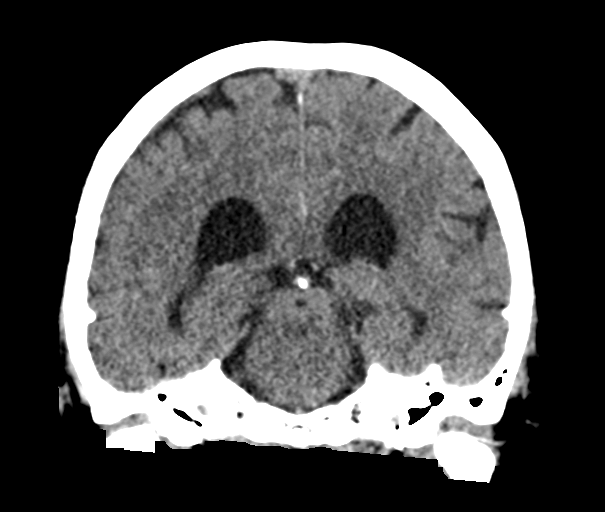
[im 35/63  brain]
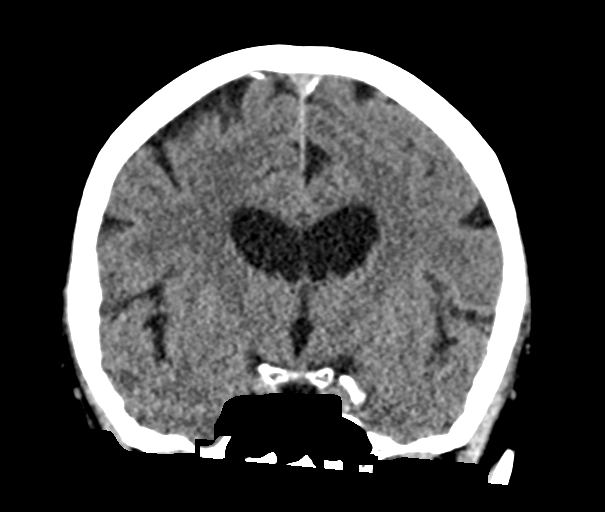

[Series 7: c_spine 2.0 i30s 3 · axial · 0.36mm/px · z∈[-251,-227]mm · 3 of 61 slices shown]
[im 7/61  brain]
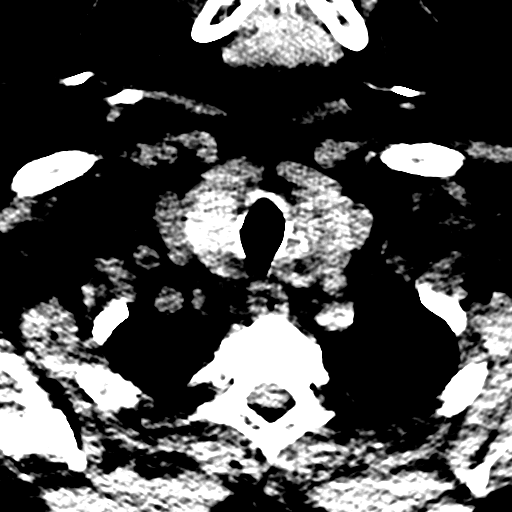
[im 13/61  brain]
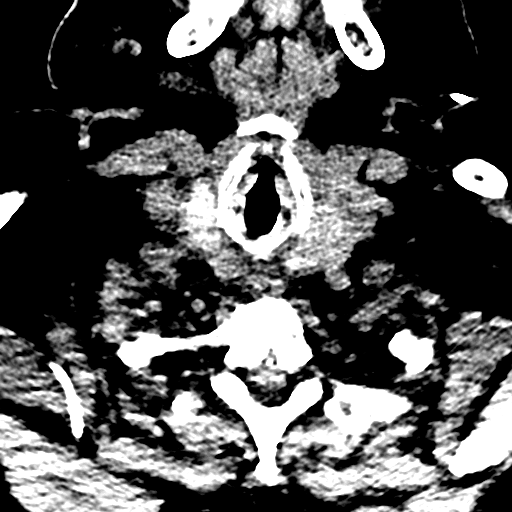
[im 19/61  brain]
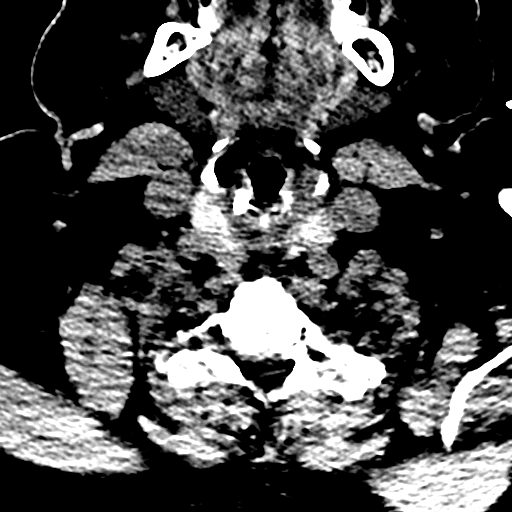

[Series 10: sagittals · sagittal · 0.31mm/px · 2 of 93 slices shown]
[im 31/93  brain]
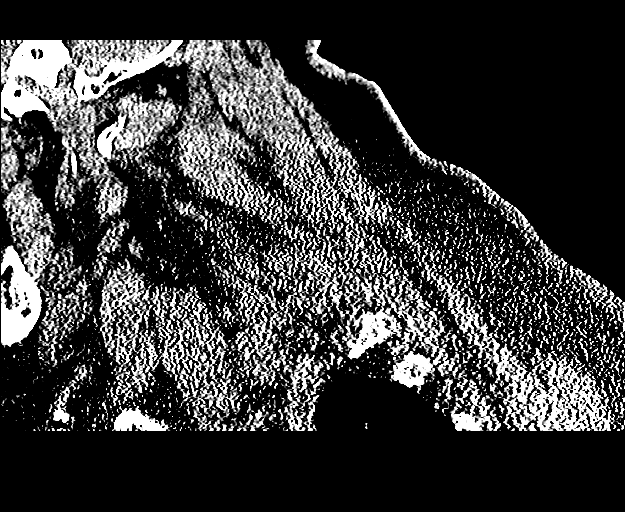
[im 62/93  brain]
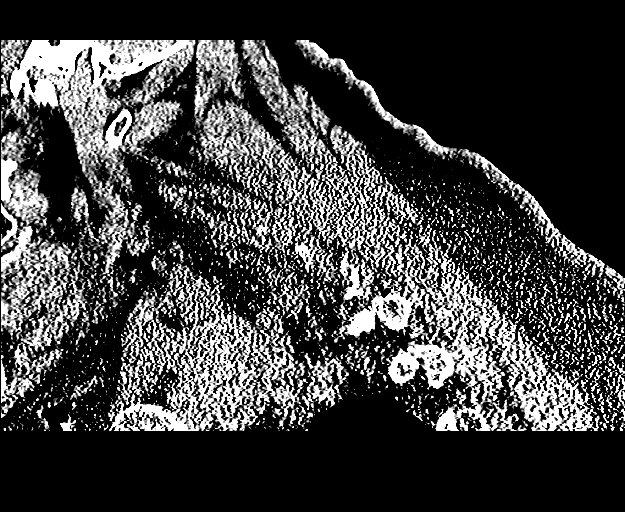

[16 of 47 positions shown; findings below may reference images not displayed]

FINDINGS: CT HEAD FINDINGS

Brain: Chiari I malformation is again noted with similar degree of
inferior tonsillar displacement, approximately 13 mm. There is no
evidence of hydrocephalus. No acute infarct, intracranial
hemorrhage, mass, midline shift, or extra-axial fluid collection is
identified.

Vascular: No hyperdense vessel.

Skull: No fracture or suspicious osseous lesion.

Sinuses/Orbits: Visualized paranasal sinuses and mastoid air cells
are clear. Visualized orbits are unremarkable.

Other: None.

CT CERVICAL SPINE FINDINGS

Alignment: No subluxation.

Skull base and vertebrae: No acute fracture or destructive osseous
process.

Soft tissues and spinal canal: No prevertebral swelling.

Disc levels: Mild disc degeneration from C5-T2. Very mild disc space
narrowing and spurring at C4-5 as well. Mild-to-moderate left facet
arthrosis at C2-3 and C3-4. No high-grade osseous spinal or neural
foraminal stenosis.

Upper chest: Clear lung apices.

Other: Partially visualized thyroid goiter with bilateral
enlargement, slightly greater on the left with underlying
heterogeneity and small calcifications. Associated minimal rightward
tracheal deviation and likely mild extension into the superior
mediastinum, incompletely imaged. Retropharyngeal course of the
carotid arteries bilaterally which contact one another in the
midline. Mild bilateral carotid artery calcified atherosclerosis.
IMPRESSION: 1. No evidence of acute intracranial abnormality.
2. Chiari I malformation.
3. No acute osseous abnormality identified in the cervical spine.
Mild disc and facet degeneration.
4. Thyroid goiter.

## 2018-10-01 ENCOUNTER — Ambulatory Visit: Payer: Medicare HMO | Admitting: Cardiology

## 2018-10-01 ENCOUNTER — Encounter: Payer: Self-pay | Admitting: Cardiology

## 2018-10-01 VITALS — BP 150/76 | HR 80 | Ht 59.0 in | Wt 222.2 lb

## 2018-10-01 DIAGNOSIS — I10 Essential (primary) hypertension: Secondary | ICD-10-CM | POA: Diagnosis not present

## 2018-10-01 DIAGNOSIS — E785 Hyperlipidemia, unspecified: Secondary | ICD-10-CM | POA: Diagnosis not present

## 2018-10-01 MED ORDER — SPIRONOLACTONE 25 MG PO TABS: 12.5000 mg | ORAL_TABLET | Freq: Every day | ORAL | 3 refills | Status: DC

## 2018-10-01 NOTE — Progress Notes (Signed)
Cardiology Office Note    Date:  10/01/2018   ID:  Anne Barnes, DOB Dec 10, 1945, MRN [id number]  PCP:  Mosie Lukes, MD  Cardiologist:  Ena Dawley, MD   Chief complaint: Orthostatic hypotension, dizziness, lower extremity edema.  History of Present Illness:  Anne Barnes is a 73 y.o. female was seen in June 2018 for bradycardia down to low 50s while on beta blocker. The patient was asymptomatic at the time and states that she has always had slow heart rate. Pt with hx of HTN, hyperglycemia, mild HLD, osteoarthritis, tinnitus, IBS, OSA intolerant to CPAP and headaches thought to be allergies.  Dr. Annamaria Boots follows her sleep apnea. Echo done 02/2017 EF was 60-65% no RWMA,  No significant valvular abnormalities.  05/14/2018 -the patient is coming for follow-up states that she has been feeling well no chest pain shortness of breath occasional lower extremity edema controlled by Lasix 40 mg daily.  She has had few episodes of blood pressure in 180s 190s that is associated with headache and profound fatigue for about a day.  Her primary care physician increase her amlodipine to 2.5 mg p.o. twice daily.  Thankfully this only happens once every couple of months.  She denies any palpitation dizziness or syncope.  10/01/2018 -3 months follow-up, she complains of dizziness in the morning that resolves around noon.  Also her blood pressures in the 130s her morning blood in 150 range by noon.  She stopped taking Lasix as she developed significant cramping.  She denies any chest pain or shortness of breath, and no syncope.   Past Medical History:  Diagnosis Date  . Acute combined systolic and diastolic heart failure ([place]) 05/26/2015  . ALLERGIC RHINITIS   . Allergic urticaria 08/01/2014   Patient reports allergies to shrimp, egg whites, tomatoes, dairy   . 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   Office Spirometry 11/07/17-WNL-FVC 1.58/82%, FEV1 1.27/85%, ratio 0.80, FEF 25-75% 0.26/89%  . Bradycardia  02/25/2017  . Chiari malformation    Noted MRI brain 09/2013 - s/p neuro eval for same  . DIVERTICULOSIS, COLON   . Essential hypertension 08/28/2010   Qualifier: Diagnosis of  By: Marca Ancona RMA, Lucy     . Frequent headaches   . GERD   . H/O measles   . H/O mumps   . Hearing loss 01/18/2017  . History of chicken pox   . Hyperglycemia 03/05/2016  . HYPERTENSION   . Hypocalcemia 02/25/2017  . IBS (irritable bowel syndrome) 05/26/2015  . Nonspecific abnormal electrocardiogram (ECG) (EKG)   . OBSTRUCTIVE SLEEP APNEA 12/2008 dx   noncompliant with CPAP qhs  . Obstructive sleep apnea 08/28/2010   NPSG 12/2008:  AHI 13/hr with desats to 78%   . Preventative health care 06/11/2017  . Sciatica    right side  . Seasonal and perennial allergic rhinitis 08/28/2010   Allergy profile 03/11/14- positive especially for dust, cat and dog Food allergy profile- total IgE 86 with several food group elevations Sed rate-WNL     . Sinusitis 06/06/2017  . 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 Vaginitis 01/18/2017  . Vitamin D deficiency 06/11/2017   Past Surgical History:  Procedure Laterality Date  . ABDOMINAL HYSTERECTOMY  1986  . BREAST SURGERY  1973   Breast biopsy  . UMBILICAL HERNIA REPAIR     Current Medications: Outpatient Medications Prior to Visit  Medication Sig Dispense Refill  . albuterol (PROVENTIL HFA;VENTOLIN HFA) 108 (90 Base) MCG/ACT inhaler Inhale 2 puffs into the  lungs every 6 (six) hours as needed for wheezing or shortness of breath. 1 Inhaler 6  . amLODipine (NORVASC) 2.5 MG tablet Take 1 tablet (2.5 mg total) by mouth 2 (two) times daily. 180 tablet 2  . b complex vitamins tablet Take 1 tablet by mouth daily.    . cetirizine (ZYRTEC) 10 MG tablet Take 1 tablet (10 mg total) by mouth daily. 30 tablet 11  . Cholecalciferol (VITAMIN D) 2000 UNITS CAPS Take by mouth daily.     Marland Kitchen doxycycline (VIBRA-TABS) 100 MG tablet Take 1 tablet (100 mg total) by mouth 2 (two) times daily. 20 tablet 0  .  hydrALAZINE (APRESOLINE) 25 MG tablet Take 1 tablet (25 mg total) by mouth 3 (three) times daily as needed. Take as needed for BP greater than 140/90. 90 tablet 4  . ipratropium (ATROVENT) 0.06 % nasal spray 1-2 puffs each nostril twice daily as needed 15 mL 2  . losartan (COZAAR) 100 MG tablet TAKE 1 TABLET BY MOUTH ONCE DAILY 90 tablet 1  . Multiple Vitamin (MULTIVITAMIN) tablet Take 1 tablet by mouth daily.      . Omega 3-6-9 Fatty Acids (OMEGA 3-6-9 COMPLEX PO) Take 1 capsule by mouth daily.      Marland Kitchen omeprazole (PRILOSEC) 20 MG capsule TAKE 1 CAPSULE BY MOUTH ONCE DAILY 90 capsule 3  . Vitamin D, Ergocalciferol, (DRISDOL) 50000 units CAPS capsule Take 1 capsule (50,000 Units total) by mouth every 7 (seven) days. 12 capsule 1  . furosemide (LASIX) 40 MG tablet Take 1 tablet (40 mg total) by mouth daily. 90 tablet 3  . ranitidine (ZANTAC) 300 MG tablet Take 1 tablet (300 mg total) by mouth at bedtime. (Patient not taking: Reported on 10/01/2018) 30 tablet 3   No facility-administered medications prior to visit.    Allergies:   Codeine; Lisinopril-hydrochlorothiazide; Metoprolol; and Other   Social History   Socioeconomic History  . Marital status: Married    Spouse name: Not on file  . Number of children: Not on file  . Years of education: Not on file  . Highest education level: Not on file  Occupational History  . Occupation: retired  Scientific laboratory technician  . Financial resource strain: Not on file  . Food insecurity:    Worry: Not on file    Inability: Not on file  . Transportation needs:    Medical: Not on file    Non-medical: Not on file  Tobacco Use  . Smoking status: Never Smoker  . Smokeless tobacco: Never Used  . Tobacco comment: Married, lives with spouse, dtr 2 g-kids.  Substance and Sexual Activity  . Alcohol use: No    Comment: rare  . Drug use: No  . Sexual activity: Not Currently    Comment: lives with husband, no dietary restrictions avoid foods allergic to, retired    Lifestyle  . Physical activity:    Days per week: Not on file    Minutes per session: Not on file  . Stress: Not on file  Relationships  . Social connections:    Talks on phone: Not on file    Gets together: Not on file    Attends religious service: Not on file    Active member of club or organization: Not on file    Attends meetings of clubs or organizations: Not on file    Relationship status: Not on file  Other Topics Concern  . Not on file  Social History Narrative  . Not on file  Family History:  The patient's family history includes Alcohol abuse in her brother and another family member; Arthritis in her father and mother; Diabetes in her brother; Fibromyalgia in her daughter; Berenice Primas' disease in her daughter; Heart attack in her brother; Heart disease in her brother and father; Hypertension in her daughter, daughter, maternal grandfather, and sister; Kidney disease in her father, sister, and sister; Lung cancer in her brother; Lupus in her daughter; Obesity in her sister; Pneumonia in her brother and father; Rheum arthritis in her daughter; Sjogren's syndrome in her daughter; Throat cancer in her brother.   ROS:   Please see the history of present illness.    ROS All other systems reviewed and are negative.  PHYSICAL EXAM:   VS:  BP (!) 150/76   Pulse 80   Ht 4\' 11"  (1.499 m)   Wt 222 lb 3.2 oz (100.8 kg)   SpO2 96%   BMI 44.88 kg/m    GEN: Well nourished, well developed, in no acute distress  HEENT: normal  Neck: no JVD, carotid bruits, or masses Cardiac: RRR; no murmurs, rubs, or gallops, bilateral pitting edema up to the ankles. Respiratory:  clear to auscultation bilaterally, normal work of breathing GI: soft, nontender, nondistended, + BS MS: no deformity or atrophy  Skin: warm and dry, no rash Neuro:  Alert and Oriented x 3, Strength and sensation are intact Psych: euthymic mood, full affect  Wt Readings from Last 3 Encounters:  10/01/18 222 lb 3.2 oz  (100.8 kg)  07/14/18 224 lb 12.8 oz (102 kg)  06/06/18 224 lb 12.8 oz (102 kg)    Studies/Labs Reviewed:   EKG:  EKG is not ordered today.    Recent Labs: 07/14/2018: ALT 14; BUN 22; Creatinine, Ser 0.85; Hemoglobin 13.0; Platelets 268.0; Potassium 3.9; Sodium 142; TSH 1.11   Lipid Panel    Component Value Date/Time   CHOL 155 07/14/2018 0959   TRIG 46.0 07/14/2018 0959   TRIG 55 12/30/2009   HDL 42.00 07/14/2018 0959   CHOLHDL 4 07/14/2018 0959   VLDL 9.2 07/14/2018 0959   LDLCALC 104 (H) 07/14/2018 0959   Additional studies/ records that were reviewed today include:  TTE: 02/2017 - Left ventricle: The cavity size was normal. Systolic function was   normal. The estimated ejection fraction was in the range of 60%   to 65%. Wall motion was normal; there were no regional wall   motion abnormalities. There was no evidence of elevated   ventricular filling pressure by Doppler parameters.    ASSESSMENT:    1. Essential hypertension   2. Hyperlipidemia, unspecified hyperlipidemia type     PLAN:  In order of problems listed above:  1.  Hypertension -blood pressure still slightly elevated, I will add spironolactone 12.5 mg daily, she did not tolerate hydrochlorothiazide in the past.  2. Orthostatic hypotension -no actual syncope.  Will continue current medical management  3.  Bilateral lower extremity edema -could not tolerate Lasix, will start spironolactone 12.5 mg daily.  4. Sinus brady with fatigue.  Atenolol was discontinued her heart rate remained slow but with improved energy.     6. HLD - LDL 104, will continue diet and exercise only for now.   Current medicines are reviewed with the patient today.  The patient Has no concerns regarding medicines.  Medication Adjustments/Labs and Tests Ordered: Current medicines are reviewed at length with the patient today.  Concerns regarding medicines are outlined above.  Medication changes, Labs and Tests ordered today  are  listed in the Patient Instructions below. Patient Instructions  Medication Instructions:   STOP TAKING LASIX NOW  START TAKING SPIRONOLACTONE 12.5 MG ONCE DAILY     Follow-Up:  WITH DR Meda Coffee IN 3 MONTHS--CHECKOUT PLEASE SCHEDULE THIS PATIENT AT DR Lawrie Tunks'S NEXT AVAILABLE APPOINTMENT AROUND 3 MONTHS--PER DR Meda Coffee       If you need a refill on your cardiac medications before your next appointment, please call your pharmacy.      Signed, Ena Dawley, MD  10/01/2018 10:01 AM    [address], [place], [place]  [address] Phone: [phone]; Fax: [phone]

## 2018-10-01 NOTE — Patient Instructions (Signed)
Medication Instructions:   STOP TAKING LASIX NOW  START TAKING SPIRONOLACTONE 12.5 MG ONCE DAILY     Follow-Up:  WITH DR Meda Coffee IN 3 MONTHS--CHECKOUT PLEASE SCHEDULE THIS PATIENT AT DR NELSON'S NEXT AVAILABLE APPOINTMENT AROUND 3 MONTHS--PER DR Meda Coffee       If you need a refill on your cardiac medications before your next appointment, please call your pharmacy.

## 2018-10-13 DIAGNOSIS — G4733 Obstructive sleep apnea (adult) (pediatric): Secondary | ICD-10-CM | POA: Diagnosis not present

## 2018-11-13 DIAGNOSIS — G4733 Obstructive sleep apnea (adult) (pediatric): Secondary | ICD-10-CM | POA: Diagnosis not present

## 2018-11-14 ENCOUNTER — Ambulatory Visit: Payer: Medicare HMO | Admitting: Family Medicine

## 2018-11-18 DIAGNOSIS — G4733 Obstructive sleep apnea (adult) (pediatric): Secondary | ICD-10-CM | POA: Diagnosis not present

## 2018-12-03 ENCOUNTER — Encounter: Payer: Self-pay | Admitting: Internal Medicine

## 2018-12-05 ENCOUNTER — Encounter: Payer: Self-pay | Admitting: Internal Medicine

## 2018-12-05 ENCOUNTER — Other Ambulatory Visit: Payer: Self-pay | Admitting: Family Medicine

## 2018-12-05 ENCOUNTER — Other Ambulatory Visit: Payer: Self-pay

## 2018-12-05 ENCOUNTER — Ambulatory Visit: Payer: Medicare HMO | Admitting: Internal Medicine

## 2018-12-05 DIAGNOSIS — J302 Other seasonal allergic rhinitis: Secondary | ICD-10-CM

## 2018-12-05 DIAGNOSIS — J3089 Other allergic rhinitis: Secondary | ICD-10-CM | POA: Diagnosis not present

## 2018-12-05 DIAGNOSIS — G4733 Obstructive sleep apnea (adult) (pediatric): Secondary | ICD-10-CM | POA: Diagnosis not present

## 2018-12-05 MED ORDER — ALBUTEROL SULFATE HFA 108 (90 BASE) MCG/ACT IN AERS: 2.0000 | INHALATION_SPRAY | Freq: Four times a day (QID) | RESPIRATORY_TRACT | 12 refills | Status: DC | PRN

## 2018-12-05 MED ORDER — IPRATROPIUM BROMIDE 0.06 % NA SOLN: NASAL | 12 refills | Status: DC

## 2018-12-05 NOTE — Progress Notes (Signed)
HPI female never smoker followed for rhinitis/allergies, asthma, OSA/ failed CPAP Peanut and cat cause coughing and we advised her to avoid them Allergy profile 03/11/14- positive especially for dust, cat and dog Food allergy profile- total IgE 86 with several food group elevations Sed rate-WNL Office Spirometry 11/07/17-WNL-FVC 1.58/82%, FEV1 1.27/85%, ratio 0.80, FEF 25-75% 0.26/89% HST 12/05/2017-AHI 12.3/hour, desaturation to 82%, body weight 226 pounds ---------------------------------------------------------------------------------------- 06/06/2018- 73 year old female never smoker followed for rhinitis/allergies, asthma, OSA HST 12/05/2017-AHI 12.3/hour, desaturation to 82%, body weight 226 pounds CPAP auto 5-20/ [facility] -----[facility]: DME [facility]; Pt wears CPAP nightly and DL is attached.  Atrovent 0.06% nasal spray, Zyrtec, albuterol HFA, Doing well since she is restarted CPAP but says she is "still not quite used to it".  Using tape to keep herself from pulling mask off in her sleep.  Does sleep better with it. Download 73% compliance AHI 2.1/hour In the past week has had increased sense of postnasal drainage with little anterior rhinorrhea or sneezing.  Notes "sinus pressure".  Tries to keep diet gluten-free.  Carries Atrovent nasal spray which is some help.  Rescue inhaler is sufficient, used off and on.  Not waking with wheezing.  12/05/2018- 73 year old female never smoker followed for rhinitis/allergies, asthma, OSA CPAP auto 5-20/ [facility] Download compliance 73%, AHI 5.4/ hr Atrovent 0.06% nasal spray, Zyrtec, albuterol HFA, OSA on CPAP, works well for her; experiencing some dryness in throat and sinuses Body weight today 227 pounds,   BP on arrival 162/82("skipped BP med today") She sleeps better with CPAP but complains of postnasal drip.  Occasional frontal headache, sinus pressure.  She thinks her gluten-free diet helps.  Home is dry.  Using Atrovent and Flonase nasal  sprays.  ROS-see HPI  + = positive Constitutional:   No-   weight loss, night sweats, fevers, chills, fatigue, lassitude. HEENT:   +headaches, difficulty swallowing, tooth/dental problems, sore throat,       No-  sneezing, itching, ear ache, nasal congestion, +post nasal drip,  CV:  No-   chest pain, orthopnea, PND, swelling in lower extremities, anasarca,  dizziness, palpitations Resp: +shortness of breath with exertion or at rest.              productive cough,  No non-productive cough,  No- coughing up of blood.              No-   change in color of mucus.  No- wheezing.   Skin: No-   rash or lesions. GI:  No-   heartburn, indigestion, abdominal pain, nausea, vomiting,  GU: . MS:  No-   joint pain or swelling.   Neuro-     nothing unusual Psych:  No- change in mood or affect. No depression or anxiety.  No memory loss.  OBJ- Physical Exam General- Alert, Oriented, Affect-appropriate, Distress- none acute, + obese Skin- rash-none, lesions- none, excoriation- none Lymphadenopathy- none Head- atraumatic            Eyes- Gross vision intact, PERRLA, conjunctivae and secretions clear            Ears- Hearing, canals-normal            Nose- + turbinate edema, no-Septal dev, mucus, polyps, erosion, perforation             Throat- Mallampati III-IV , mucosa clear , drainage- none, tonsils- atrophic, +Dentures Neck- flexible , trachea midline, no stridor , thyroid nl, carotid no bruit Chest - symmetrical excursion , unlabored  Heart/CV- RRR , no murmur , no gallop  , no rub, nl s1 s2                           - JVD- none , edema- none, stasis changes- none, varices- none           Lung- clear to P&A, wheeze-none, cough none, dullness-none, rub- none           Chest wall-  Abd- Br/ Gen/ Rectal- Not done, not indicated Extrem- cyanosis- none, clubbing, none, atrophy- none, strength- nl Neuro- grossly intact to observation

## 2018-12-05 NOTE — Patient Instructions (Signed)
Refill scripts sent for Albuterol inhaler and atrovent nasal spray  We talked about trying otc Xyzal antihistamine instead of Zyrtec for a while to compare  We can continue CPAP auto 5-20, mask of choice, humidifier, supplies, Airview/ card

## 2018-12-12 DIAGNOSIS — G4733 Obstructive sleep apnea (adult) (pediatric): Secondary | ICD-10-CM | POA: Diagnosis not present

## 2018-12-16 NOTE — Assessment & Plan Note (Signed)
Spring allergy season management reviewed Plan- options for nasal spray and antihistamine considered

## 2018-12-16 NOTE — Assessment & Plan Note (Signed)
She continues to benefit from CPAP with improved sleep. Plan-discussed compliance goals.  Reviewed sleep habits.  Continue CPAP auto 5-20

## 2019-01-01 ENCOUNTER — Telehealth: Payer: Self-pay | Admitting: *Deleted

## 2019-01-01 NOTE — Telephone Encounter (Signed)
Virtual Visit Pre-Appointment Phone Call  Steps For Call:  1. Confirm consent - "In the setting of the current Covid19 crisis, you are scheduled for a (video) visit with Dr. Meda Coffee on 4/23 at 9:20 am .  Just as we do with many in-office visits, in order for you to participate in this visit, we must obtain consent.  If you'd like, I can send this to your mychart (if signed up) or email for you to review.  Otherwise, I can obtain your verbal consent now.  All virtual visits are billed to your insurance company just like a normal visit would be.  By agreeing to a virtual visit, we'd like you to understand that the technology does not allow for your provider to perform an examination, and thus may limit your provider's ability to fully assess your condition.  Finally, though the technology is pretty good, we cannot assure that it will always work on either your or our end, and in the setting of a video visit, we may have to convert it to a phone-only visit.  In either situation, we cannot ensure that we have a secure connection.  Are you willing to proceed?"   2. Advise patient to be prepared with any vital sign or heart rhythm information, their current medicines, and a piece of paper and pen handy for any instructions they may receive the day of their visit  3. Inform patient they will receive a phone call 15 minutes prior to their appointment time (may be from unknown caller ID) so they should be prepared to answer  4. Confirm that appointment type is correct in Epic appointment notes (video visit using [facility] with Dr. Meda Coffee on 01/15/19 at 9:20 am)    TELEPHONE CALL NOTE  Anne Barnes has been deemed a candidate for a follow-up tele-health visit to limit community exposure during the Covid-19 pandemic. I spoke with the patient via phone to ensure availability of phone/video source, confirm preferred email & phone number, and discuss instructions and expectations.  I reminded Anne Barnes  to be prepared with any vital sign and/or heart rhythm information that could potentially be obtained via home monitoring, at the time of her visit. I reminded Anne Barnes to expect a phone call at the time of her visit if her visit.  Did the patient verbally acknowledge consent to treatment? YES PATIENT GAVE VERBAL CONSENT FOR DR NELSON TO TREAT HER VIA VIDEO VISIT WITH [facility] ON 01/15/19 AT 9:20 AM--PT WAS SENT A TEXT TO HER CELL PHONE WITH THE LINK TO ACTIVATE HER MYCHART ACCOUNT.   Anne Alpha, LPN 06/02/2425 8:34 PM   CONSENT FOR TELE-HEALTH VISIT - PLEASE REVIEW  I hereby voluntarily request, consent and authorize [facility] and its employed or contracted physicians, physician assistants, nurse practitioners or other licensed health care professionals (the Practitioner), to provide me with telemedicine health care services (the Services") as deemed necessary by the treating Practitioner. I acknowledge and consent to receive the Services by the Practitioner via telemedicine. I understand that the telemedicine visit will involve communicating with the Practitioner through live audiovisual communication technology and the disclosure of certain medical information by electronic transmission. I acknowledge that I have been given the opportunity to request an in-person assessment or other available alternative prior to the telemedicine visit and am voluntarily participating in the telemedicine visit.  I understand that I have the right to withhold or withdraw my consent to the use of telemedicine in the course  of my care at any time, without affecting my right to future care or treatment, and that the Practitioner or I may terminate the telemedicine visit at any time. I understand that I have the right to inspect all information obtained and/or recorded in the course of the telemedicine visit and may receive copies of available information for a reasonable fee.  I understand that some of the  potential risks of receiving the Services via telemedicine include:   Delay or interruption in medical evaluation due to technological equipment failure or disruption;  Information transmitted may not be sufficient (e.g. poor resolution of images) to allow for appropriate medical decision making by the Practitioner; and/or   In rare instances, security protocols could fail, causing a breach of personal health information.  Furthermore, I acknowledge that it is my responsibility to provide information about my medical history, conditions and care that is complete and accurate to the best of my ability. I acknowledge that Practitioner's advice, recommendations, and/or decision may be based on factors not within their control, such as incomplete or inaccurate data provided by me or distortions of diagnostic images or specimens that may result from electronic transmissions. I understand that the practice of medicine is not an exact science and that Practitioner makes no warranties or guarantees regarding treatment outcomes. I acknowledge that I will receive a copy of this consent concurrently upon execution via email to the email address I last provided but may also request a printed copy by calling the office of [facility].    I understand that my insurance will be billed for this visit.   I have read or had this consent read to me.  I understand the contents of this consent, which adequately explains the benefits and risks of the Services being provided via telemedicine.   I have been provided ample opportunity to ask questions regarding this consent and the Services and have had my questions answered to my satisfaction.  I give my informed consent for the services to be provided through the use of telemedicine in my medical care  By participating in this telemedicine visit I agree to the above.  PT AGREED FOR VIDEO VISIT WITH DR NELSON ON 4/23 AND GAVE A VERBAL CONSENT TO TREAT.

## 2019-01-12 DIAGNOSIS — G4733 Obstructive sleep apnea (adult) (pediatric): Secondary | ICD-10-CM | POA: Diagnosis not present

## 2019-01-15 ENCOUNTER — Telehealth (INDEPENDENT_AMBULATORY_CARE_PROVIDER_SITE_OTHER): Payer: Medicare HMO | Admitting: Cardiology

## 2019-01-15 ENCOUNTER — Encounter: Payer: Self-pay | Admitting: Cardiology

## 2019-01-15 ENCOUNTER — Other Ambulatory Visit: Payer: Self-pay

## 2019-01-15 DIAGNOSIS — R001 Bradycardia, unspecified: Secondary | ICD-10-CM | POA: Diagnosis not present

## 2019-01-15 DIAGNOSIS — I5041 Acute combined systolic (congestive) and diastolic (congestive) heart failure: Secondary | ICD-10-CM | POA: Diagnosis not present

## 2019-01-15 DIAGNOSIS — R002 Palpitations: Secondary | ICD-10-CM | POA: Diagnosis not present

## 2019-01-15 DIAGNOSIS — I110 Hypertensive heart disease with heart failure: Secondary | ICD-10-CM

## 2019-01-15 DIAGNOSIS — I10 Essential (primary) hypertension: Secondary | ICD-10-CM

## 2019-01-15 MED ORDER — HYDRALAZINE HCL 25 MG PO TABS: 25.0000 mg | ORAL_TABLET | Freq: Three times a day (TID) | ORAL | 3 refills | Status: DC

## 2019-01-15 NOTE — Progress Notes (Signed)
Virtual Visit via Video Note   This visit type was conducted due to national recommendations for restrictions regarding the COVID-19 Pandemic (e.g. social distancing) in an effort to limit this patient's exposure and mitigate transmission in our community.  Due to her co-morbid illnesses, this patient is at least at moderate risk for complications without adequate follow up.  This format is felt to be most appropriate for this patient at this time.  All issues noted in this document were discussed and addressed.  A limited physical exam was performed with this format.  Please refer to the patient's chart for her consent to telehealth for [facility].   Evaluation Performed:  Follow-up visit  Date:  01/15/2019   ID:  Anne, Barnes 1945/11/21, MRN [id number]  Patient Location: Home Provider Location: Home  PCP:  Mosie Lukes, MD  Cardiologist:  Ena Dawley, MD   Chief Complaint:  Dizziness, palpitations  History of Present Illness:    Anne Barnes is a 73 y.o. female followed for female was seen in June 2018 for bradycardia down to low 50s while on beta blocker. The patient was asymptomatic at the time and states that she has always had slow heart rate. Pt with hx of HTN, hyperglycemia, mild HLD, osteoarthritis, tinnitus, IBS, OSA intolerant to CPAP and headaches thought to be allergies.  Dr. Annamaria Boots follows her sleep apnea. Echo done 02/2017 EF was 60-65% no RWMA,  No significant valvular abnormalities.  05/14/2018 -the patient is coming for follow-up states that she has been feeling well no chest pain shortness of breath occasional lower extremity edema controlled by Lasix 40 mg daily.  She has had few episodes of blood pressure in 180s 190s that is associated with headache and profound fatigue for about a day.  Her primary care physician increase her amlodipine to 2.5 mg p.o. twice daily.  Thankfully this only happens once every couple of months.  She denies any palpitation  dizziness or syncope.  10/01/2018 -3 months follow-up, she complains of dizziness in the morning that resolves around noon.  Also her blood pressures in the 130s her morning blood in 150 range by noon.  She stopped taking Lasix as she developed significant cramping.  She denies any chest pain or shortness of breath, and no syncope.  01/15/19 -the patient is doing okay, she has been walking 20 to 60 minutes a day without chest pain or shortness of breath but she has been experiencing episodes of dizziness mostly when her blood pressure is elevated.  Her blood pressure ranges from 140-180 mmHg, she is very careful with diet does not add any salt.  She has been experiencing muscle cramping since starting spironolactone but she prefers to continue.  The patient does not have symptoms concerning for COVID-19 infection (fever, chills, cough, or new shortness of breath).   Past Medical History:  Diagnosis Date   Acute combined systolic and diastolic heart failure ([place]) 05/26/2015   ALLERGIC RHINITIS    Allergic urticaria 08/01/2014   Patient reports allergies to shrimp, egg whites, tomatoes, dairy    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   Office Spirometry 11/07/17-WNL-FVC 1.58/82%, FEV1 1.27/85%, ratio 0.80, FEF 25-75% 0.26/89%   Bradycardia 02/25/2017   Chiari malformation    Noted MRI brain 09/2013 - s/p neuro eval for same   DIVERTICULOSIS, COLON    Essential hypertension 08/28/2010   Qualifier: Diagnosis of  By: Marca Ancona RMA, Lucy      Frequent headaches    GERD  H/O measles    H/O mumps    Hearing loss 01/18/2017   History of chicken pox    Hyperglycemia 03/05/2016   HYPERTENSION    Hypocalcemia 02/25/2017   IBS (irritable bowel syndrome) 05/26/2015   Nonspecific abnormal electrocardiogram (ECG) (EKG)    OBSTRUCTIVE SLEEP APNEA 12/2008 dx   noncompliant with CPAP qhs   Obstructive sleep apnea 08/28/2010   NPSG 12/2008:  AHI 13/hr with desats to 78%    Preventative health care  06/11/2017   Sciatica    right side   Seasonal and perennial allergic rhinitis 08/28/2010   Allergy profile 03/11/14- positive especially for dust, cat and dog Food allergy profile- total IgE 86 with several food group elevations Sed rate-WNL      Sinusitis 06/06/2017   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   Vaginitis 01/18/2017   Vitamin D deficiency 06/11/2017   Past Surgical History:  Procedure Laterality Date   [place]   Breast biopsy   UMBILICAL HERNIA REPAIR       Current Meds  Medication Sig   albuterol (PROVENTIL HFA;VENTOLIN HFA) 108 (90 Base) MCG/ACT inhaler Inhale 2 puffs into the lungs every 6 (six) hours as needed for wheezing or shortness of breath.   amLODipine (NORVASC) 2.5 MG tablet Take 1 tablet (2.5 mg total) by mouth 2 (two) times daily.   b complex vitamins tablet Take 1 tablet by mouth daily.   cetirizine (ZYRTEC) 10 MG tablet Take 1 tablet (10 mg total) by mouth daily.   Cholecalciferol (VITAMIN D) 2000 UNITS CAPS Take by mouth daily.    ipratropium (ATROVENT) 0.06 % nasal spray 1-2 puffs each nostril twice daily as needed   losartan (COZAAR) 100 MG tablet TAKE 1 TABLET BY MOUTH ONCE DAILY   Multiple Vitamin (MULTIVITAMIN) tablet Take 1 tablet by mouth daily.     Omega 3-6-9 Fatty Acids (OMEGA 3-6-9 COMPLEX PO) Take 1 capsule by mouth daily.     omeprazole (PRILOSEC) 20 MG capsule TAKE 1 CAPSULE BY MOUTH ONCE DAILY   spironolactone (ALDACTONE) 25 MG tablet Take 0.5 tablets (12.5 mg total) by mouth daily.   Vitamin D, Ergocalciferol, (DRISDOL) 50000 units CAPS capsule Take 1 capsule (50,000 Units total) by mouth every 7 (seven) days.   [DISCONTINUED] hydrALAZINE (APRESOLINE) 25 MG tablet Take 1 tablet (25 mg total) by mouth 3 (three) times daily as needed. Take as needed for BP greater than 140/90.    Allergies:   Codeine; Lisinopril-hydrochlorothiazide; Metoprolol; and Other   Social History   Tobacco  Use   Smoking status: Never Smoker   Smokeless tobacco: Never Used   Tobacco comment: Married, lives with spouse, dtr 2 g-kids.  Substance Use Topics   Alcohol use: No    Comment: rare   Drug use: No    Family Hx: The patient's family history includes Alcohol abuse in her brother and another family member; Arthritis in her father and mother; Diabetes in her brother; Fibromyalgia in her daughter; Berenice Primas' disease in her daughter; Heart attack in her brother; Heart disease in her brother and father; Hypertension in her daughter, daughter, maternal grandfather, and sister; Kidney disease in her father, sister, and sister; Lung cancer in her brother; Lupus in her daughter; Obesity in her sister; Pneumonia in her brother and father; Rheum arthritis in her daughter; Sjogren's syndrome in her daughter; Throat cancer in her brother. There is no history of Ataxia, Chorea, Dementia, Mental retardation, Migraines, Multiple sclerosis,  Neurofibromatosis, Neuropathy, Parkinsonism, Seizures, or Stroke.  ROS:   Please see the history of present illness.    All other systems reviewed and are negative.  Prior CV studies:   The following studies were reviewed today:  Labs/Other Tests and Data Reviewed:    EKG:  No ECG reviewed.  Recent Labs: 07/14/2018: ALT 14; BUN 22; Creatinine, Ser 0.85; Hemoglobin 13.0; Platelets 268.0; Potassium 3.9; Sodium 142; TSH 1.11   Recent Lipid Panel Lab Results  Component Value Date/Time   CHOL 155 07/14/2018 09:59 AM   TRIG 46.0 07/14/2018 09:59 AM   TRIG 55 12/30/2009   HDL 42.00 07/14/2018 09:59 AM   CHOLHDL 4 07/14/2018 09:59 AM   LDLCALC 104 (H) 07/14/2018 09:59 AM    Wt Readings from Last 3 Encounters:  01/15/19 225 lb 1.6 oz (102.1 kg)  12/05/18 227 lb 12.8 oz (103.3 kg)  10/01/18 222 lb 3.2 oz (100.8 kg)     Objective:    Vital Signs:  BP (!) 142/77    Pulse 72    Ht 4' 11.5" (1.511 m)    Wt 225 lb 1.6 oz (102.1 kg)    BMI 44.70 kg/m    VITAL  SIGNS:  reviewed   ASSESSMENT & PLAN:    1.  Hypertension -blood pressure still slightly elevated, I will change hydralazine 25 mg p.o. 3 times daily as needed to 3 times daily.   2.  Bilateral lower extremity edema -could not tolerate Lasix, slightly improved with spironolactone.  We will continue  3.  Palpitations lasting 25 to 30 minutes, no associated chest pain no dizziness or falls.  If they continue or increase in frequency we will arrange for outpatient monitoring.  4. Sinus brady with fatigue.  Atenolol was discontinued her heart rate remained slow but with improved energy.     6. HLD - LDL 104, will continue diet and exercise only for now.   COVID-19 Education: The signs and symptoms of COVID-19 were discussed with the patient and how to seek care for testing (follow up with PCP or arrange E-visit).  The importance of social distancing was discussed today.  Time:   Today, I have spent 20 minutes with the patient with telehealth technology discussing the above problems.     Medication Adjustments/Labs and Tests Ordered: Current medicines are reviewed at length with the patient today.  Concerns regarding medicines are outlined above.   Tests Ordered: No orders of the defined types were placed in this encounter.   Medication Changes: Meds ordered this encounter  Medications   hydrALAZINE (APRESOLINE) 25 MG tablet    Sig: Take 1 tablet (25 mg total) by mouth 3 (three) times daily.    Dispense:  270 tablet    Refill:  3    Disposition:  Follow up in 3 month(s)  Signed, Ena Dawley, MD  01/15/2019 9:43 AM    [facility]

## 2019-01-15 NOTE — Patient Instructions (Addendum)
Medication Instructions:   START TAKING HYDRALAZINE 25 MG BY MOUTH THREE TIMES DAILY   If you need a refill on your cardiac medications before your next appointment, please call your pharmacy.     Follow-Up:  AS ANOTHER VIRTUAL VIDEO VISIT WITH DR Meda Coffee ON Friday April 17, 2019 AT 8:00 AM

## 2019-02-11 DIAGNOSIS — G4733 Obstructive sleep apnea (adult) (pediatric): Secondary | ICD-10-CM | POA: Diagnosis not present

## 2019-02-13 ENCOUNTER — Telehealth: Payer: Self-pay | Admitting: Internal Medicine

## 2019-02-13 ENCOUNTER — Telehealth: Payer: Self-pay | Admitting: Cardiology

## 2019-02-13 MED ORDER — PREDNISONE 20 MG PO TABS: ORAL_TABLET | ORAL | 0 refills | Status: DC

## 2019-02-13 NOTE — Telephone Encounter (Signed)
Pt called to report that she has worsening SOB over the past week.. she says she normally has exacerbations with her breathing and it gets better shortly on it's own but this time it seems to be "lingering" she reports having a lot of mucus from her allergies. She denies fever, cough, sore throat... I can hear her SOB on the phone and she has audible wheezing.. she says she has wheezing periodically and her MDI normally helps but it is not taking care of it this time.. she denies edema in her extremities. Pt says her BP has been 130/70, HR 70... no chest pain, dizziness, palpitations.... but she has been weak.  She has not been around anyone with COVID but says she  Cannot be completely sure.    I urged her to call her PMD about possibly being assessed prior to the weekend. she reports that she has an allergist so I asked her to also call them. I will also forward to Dr. Meda Coffee for review.

## 2019-02-13 NOTE — Telephone Encounter (Signed)
Called & spoke w/ pt regarding these recommendations. Pt verbalized understanding with no additional questions. I verified her preferred pharmacy as [facility] Dr. Montine Circle for prednisone 20 mg, #5, 1 daily x 5 days has been placed. Nothing further needed at this time.

## 2019-02-13 NOTE — Telephone Encounter (Signed)
New Message   Pt c/o Shortness Of Breath: STAT if SOB developed within the last 24 hours or pt is noticeably SOB on the phone  1. Are you currently SOB (can you hear that pt is SOB on the phone)? Yes, I can hear it  2. How long have you been experiencing SOB? For about a week  3. Are you SOB when sitting or when up moving around? Both sitting an moving  4. Are you currently experiencing any other symptoms? Patient also complained of dizziness and weakness.

## 2019-02-13 NOTE — Telephone Encounter (Signed)
Primary Pulmonologist: Dr. Baird Lyons Last office visit and with whom: 12/05/2018 w/ CY What do we see them for (pulmonary problems): OSA, Seasonal & perennial allergic rhinitis  Reason for call: Pt states she is having SOB w/ and w/o exertion, lightheadedness, weakness, mucus built up in throat, and clear nasal drainage for several days now. States she recently spoke w/ cardiologist nurse (d/t her "leaking heart valve") and states the nurse noticed her wheezing over the phone, informed her to call pulmonary. Denies fever/chills/sweats/body aches. Using albuterol rescue inhaler 5x daily, more than usual. Also taking atrovent nasal spray. Pt states both medications work well for her, but that they do not last. Pt also let me know that this is not unusual for her.   In the last month, have you been in contact with someone who was confirmed or suspected to have [place]avirus / COVID-19?  No  Do you have any of the following symptoms developed in the last 30 days? Fever: N/A Cough: Cough only with clear nasal drainage Shortness of breath: Yes  When did your symptoms start?  A few days ago  If the patient has a fever, what is the last reading?  (use n/a if patient denies fever)  N/A . IF THE PATIENT STATES THEY DO NOT OWN A THERMOMETER, THEY MUST GO AND PURCHASE ONE When did the fever start?: N/A Have you taken any medication to suppress a fever (ie Ibuprofen, Aleve, Tylenol)?: N/A  Current Outpatient Medications on File Prior to Visit  Medication Sig Dispense Refill  . albuterol (PROVENTIL HFA;VENTOLIN HFA) 108 (90 Base) MCG/ACT inhaler Inhale 2 puffs into the lungs every 6 (six) hours as needed for wheezing or shortness of breath. 1 Inhaler 12  . amLODipine (NORVASC) 2.5 MG tablet Take 1 tablet (2.5 mg total) by mouth 2 (two) times daily. 180 tablet 2  . b complex vitamins tablet Take 1 tablet by mouth daily.    . cetirizine (ZYRTEC) 10 MG tablet Take 1 tablet (10 mg total) by mouth daily. 30  tablet 11  . Cholecalciferol (VITAMIN D) 2000 UNITS CAPS Take by mouth daily.     Marland Kitchen doxycycline (VIBRA-TABS) 100 MG tablet Take 1 tablet (100 mg total) by mouth 2 (two) times daily. (Patient not taking: Reported on 01/15/2019) 20 tablet 0  . hydrALAZINE (APRESOLINE) 25 MG tablet Take 1 tablet (25 mg total) by mouth 3 (three) times daily. 270 tablet 3  . ipratropium (ATROVENT) 0.06 % nasal spray 1-2 puffs each nostril twice daily as needed 15 mL 12  . losartan (COZAAR) 100 MG tablet TAKE 1 TABLET BY MOUTH ONCE DAILY 90 tablet 0  . Multiple Vitamin (MULTIVITAMIN) tablet Take 1 tablet by mouth daily.      . Omega 3-6-9 Fatty Acids (OMEGA 3-6-9 COMPLEX PO) Take 1 capsule by mouth daily.      Marland Kitchen omeprazole (PRILOSEC) 20 MG capsule TAKE 1 CAPSULE BY MOUTH ONCE DAILY 90 capsule 3  . spironolactone (ALDACTONE) 25 MG tablet Take 0.5 tablets (12.5 mg total) by mouth daily. 30 tablet 3  . Vitamin D, Ergocalciferol, (DRISDOL) 50000 units CAPS capsule Take 1 capsule (50,000 Units total) by mouth every 7 (seven) days. 12 capsule 1   No current facility-administered medications on file prior to visit.    Allergies  Allergen Reactions  . Codeine Nausea And Vomiting  . Lisinopril-Hydrochlorothiazide Hives    REACTION: lip swelling,facial swelling,rash.  . Metoprolol Other (See Comments)    Does not feel well on it   .  Other     Patient states she is allergic to Shrimp, egg whites, tomatoes and dairy products.  Says they make her feel unwell but do not cause hives, SOB or anaphylaxis.  She just avoids eating them because they can make her have a headache.   CY, please advise on your recommendations for this patient. Thank you.

## 2019-02-13 NOTE — Telephone Encounter (Signed)
Suggest prednisone 20 mg, # 5, 1 daily x 5 days

## 2019-02-13 NOTE — Telephone Encounter (Signed)
Can she be added to DOD schedule on Tu or We, she will also need CMP, BNP and CBC at the time, thank you

## 2019-02-17 ENCOUNTER — Encounter: Payer: Self-pay | Admitting: Cardiology

## 2019-02-17 ENCOUNTER — Ambulatory Visit: Payer: Medicare HMO | Admitting: Cardiology

## 2019-02-17 ENCOUNTER — Other Ambulatory Visit: Payer: Self-pay

## 2019-02-17 VITALS — BP 138/72 | HR 78 | Ht 59.5 in | Wt 228.0 lb

## 2019-02-17 DIAGNOSIS — E785 Hyperlipidemia, unspecified: Secondary | ICD-10-CM

## 2019-02-17 DIAGNOSIS — I10 Essential (primary) hypertension: Secondary | ICD-10-CM

## 2019-02-17 DIAGNOSIS — G4733 Obstructive sleep apnea (adult) (pediatric): Secondary | ICD-10-CM

## 2019-02-17 DIAGNOSIS — R0602 Shortness of breath: Secondary | ICD-10-CM | POA: Diagnosis not present

## 2019-02-17 DIAGNOSIS — R600 Localized edema: Secondary | ICD-10-CM | POA: Diagnosis not present

## 2019-02-17 DIAGNOSIS — Z9989 Dependence on other enabling machines and devices: Secondary | ICD-10-CM

## 2019-02-17 NOTE — Patient Instructions (Signed)
Medication Instructions:  Your physician recommends that you continue on your current medications as directed. Please refer to the Current Medication list given to you today.  If you need a refill on your cardiac medications before your next appointment, please call your pharmacy.   Lab work: TODAY: CMET, CBC W/ DIFF & PRO BNP  If you have labs (blood work) drawn today and your tests are completely normal, you will receive your results only by: Marland Kitchen MyChart Message (if you have MyChart) OR . A paper copy in the mail If you have any lab test that is abnormal or we need to change your treatment, we will call you to review the results.  Testing/Procedures: NONE  Follow-Up: You are scheduled for a virtual visit with Dr. Meda Coffee on 04/17/2019  Any Other Special Instructions Will Be Listed Below (If Applicable).  DASH Eating Plan DASH stands for "Dietary Approaches to Stop Hypertension." The DASH eating plan is a healthy eating plan that has been shown to reduce high blood pressure (hypertension). It may also reduce your risk for type 2 diabetes, heart disease, and stroke. The DASH eating plan may also help with weight loss. What are tips for following this plan?  General guidelines  Avoid eating more than 2,300 mg (milligrams) of salt (sodium) a day. If you have hypertension, you may need to reduce your sodium intake to 1,500 mg a day.  Limit alcohol intake to no more than 1 drink a day for nonpregnant women and 2 drinks a day for men. One drink equals 12 oz of beer, 5 oz of wine, or 1 oz of hard liquor.  Work with your health care provider to maintain a healthy body weight or to lose weight. Ask what an ideal weight is for you.  Get at least 30 minutes of exercise that causes your heart to beat faster (aerobic exercise) most days of the week. Activities may include walking, swimming, or biking.  Work with your health care provider or diet and nutrition specialist (dietitian) to adjust your  eating plan to your individual calorie needs. Reading food labels   Check food labels for the amount of sodium per serving. Choose foods with less than 5 percent of the Daily Value of sodium. Generally, foods with less than 300 mg of sodium per serving fit into this eating plan.  To find whole grains, look for the word "whole" as the first word in the ingredient list. Shopping  Buy products labeled as "low-sodium" or "no salt added."  Buy fresh foods. Avoid canned foods and premade or frozen meals. Cooking  Avoid adding salt when cooking. Use salt-free seasonings or herbs instead of table salt or sea salt. Check with your health care provider or pharmacist before using salt substitutes.  Do not fry foods. Cook foods using healthy methods such as baking, boiling, grilling, and broiling instead.  Cook with heart-healthy oils, such as olive, canola, soybean, or sunflower oil. Meal planning  Eat a balanced diet that includes: ? 5 or more servings of fruits and vegetables each day. At each meal, try to fill half of your plate with fruits and vegetables. ? Up to 6-8 servings of whole grains each day. ? Less than 6 oz of lean meat, poultry, or fish each day. A 3-oz serving of meat is about the same size as a deck of cards. One egg equals 1 oz. ? 2 servings of low-fat dairy each day. ? A serving of nuts, seeds, or beans 5 times each  week. ? Heart-healthy fats. Healthy fats called Omega-3 fatty acids are found in foods such as flaxseeds and coldwater fish, like sardines, salmon, and mackerel.  Limit how much you eat of the following: ? Canned or prepackaged foods. ? Food that is high in trans fat, such as fried foods. ? Food that is high in saturated fat, such as fatty meat. ? Sweets, desserts, sugary drinks, and other foods with added sugar. ? Full-fat dairy products.  Do not salt foods before eating.  Try to eat at least 2 vegetarian meals each week.  Eat more home-cooked food and  less restaurant, buffet, and fast food.  When eating at a restaurant, ask that your food be prepared with less salt or no salt, if possible. What foods are recommended? The items listed may not be a complete list. Talk with your dietitian about what dietary choices are best for you. Grains Whole-grain or whole-wheat bread. Whole-grain or whole-wheat pasta. Brown rice. [place]. Bulgur. Whole-grain and low-sodium cereals. Pita bread. Low-fat, low-sodium crackers. Whole-wheat flour tortillas. Vegetables Fresh or frozen vegetables (raw, steamed, roasted, or grilled). Low-sodium or reduced-sodium tomato and vegetable juice. Low-sodium or reduced-sodium tomato sauce and tomato paste. Low-sodium or reduced-sodium canned vegetables. Fruits All fresh, dried, or frozen fruit. Canned fruit in natural juice (without added sugar). Meat and other protein foods Skinless chicken or [place]. Ground chicken or [place]. Pork with fat trimmed off. Fish and seafood. Egg whites. Dried beans, peas, or lentils. Unsalted nuts, nut butters, and seeds. Unsalted canned beans. Lean cuts of beef with fat trimmed off. Low-sodium, lean deli meat. Dairy Low-fat (1%) or fat-free (skim) milk. Fat-free, low-fat, or reduced-fat cheeses. Nonfat, low-sodium ricotta or cottage cheese. Low-fat or nonfat yogurt. Low-fat, low-sodium cheese. Fats and oils Soft margarine without trans fats. Vegetable oil. Low-fat, reduced-fat, or light mayonnaise and salad dressings (reduced-sodium). Canola, safflower, olive, soybean, and sunflower oils. Avocado. Seasoning and other foods Herbs. Spices. Seasoning mixes without salt. Unsalted popcorn and pretzels. Fat-free sweets. What foods are not recommended? The items listed may not be a complete list. Talk with your dietitian about what dietary choices are best for you. Grains Baked goods made with fat, such as croissants, muffins, or some breads. Dry pasta or rice meal packs. Vegetables  Creamed or fried vegetables. Vegetables in a cheese sauce. Regular canned vegetables (not low-sodium or reduced-sodium). Regular canned tomato sauce and paste (not low-sodium or reduced-sodium). Regular tomato and vegetable juice (not low-sodium or reduced-sodium). Angie Fava. Olives. Fruits Canned fruit in a light or heavy syrup. Fried fruit. Fruit in cream or butter sauce. Meat and other protein foods Fatty cuts of meat. Ribs. Fried meat. Berniece Salines. Sausage. Bologna and other processed lunch meats. Salami. Fatback. Hotdogs. Bratwurst. Salted nuts and seeds. Canned beans with added salt. Canned or smoked fish. Whole eggs or egg yolks. Chicken or [place] with skin. Dairy Whole or 2% milk, cream, and half-and-half. Whole or full-fat cream cheese. Whole-fat or sweetened yogurt. Full-fat cheese. Nondairy creamers. Whipped toppings. Processed cheese and cheese spreads. Fats and oils Butter. Stick margarine. Lard. Shortening. Ghee. Bacon fat. Tropical oils, such as coconut, palm kernel, or palm oil. Seasoning and other foods Salted popcorn and pretzels. Onion salt, garlic salt, seasoned salt, table salt, and sea salt. Worcestershire sauce. Tartar sauce. Barbecue sauce. Teriyaki sauce. Soy sauce, including reduced-sodium. Steak sauce. Canned and packaged gravies. Fish sauce. Oyster sauce. Cocktail sauce. Horseradish that you find on the shelf. Ketchup. Mustard. Meat flavorings and tenderizers. Bouillon cubes. Hot sauce and  Tabasco sauce. Premade or packaged marinades. Premade or packaged taco seasonings. Relishes. Regular salad dressings. Where to find more information:  National Heart, Lung, and [place]: https://wilson-eaton.com/  American Heart Association: www.heart.org Summary  The DASH eating plan is a healthy eating plan that has been shown to reduce high blood pressure (hypertension). It may also reduce your risk for type 2 diabetes, heart disease, and stroke.  With the DASH eating plan, you should  limit salt (sodium) intake to 2,300 mg a day. If you have hypertension, you may need to reduce your sodium intake to 1,500 mg a day.  When on the DASH eating plan, aim to eat more fresh fruits and vegetables, whole grains, lean proteins, low-fat dairy, and heart-healthy fats.  Work with your health care provider or diet and nutrition specialist (dietitian) to adjust your eating plan to your individual calorie needs. This information is not intended to replace advice given to you by your health care provider. Make sure you discuss any questions you have with your health care provider. Document Released: 08/30/2011 Document Revised: 09/03/2016 Document Reviewed: 09/03/2016 Elsevier Interactive Patient Education  2019 [employer].

## 2019-02-17 NOTE — Telephone Encounter (Signed)
    COVID-19 Pre-Screening Questions:  . In the past 7 to 10 days have you had a cough,  shortness of breath, headache, congestion, fever (100 or greater) body aches, chills, sore throat, or sudden loss of taste or sense of smell? PT REPORTS NO . Have you been around anyone with known Covid 19. PT REPORTS NO . Have you been around anyone who is awaiting Covid 19 test results in the past 7 to 10 days? PT REPORTS NO . Have you been around anyone who has been exposed to Covid 19, or has mentioned symptoms of Covid 19 within the past 7 to 10 days? PT REPORTS NO  Pt will come into the office today to see Daune Perch NP for complaints of sob and swelling. This was advised by Dr Meda Coffee.  Pt is scheduled for today 5/26 at 1045 to see Daune Perch NP as a regular OV.  Placed in appt notes that the pt will need CMET, CBC W DIFF, and PRO-BNP done at this visit, as recommended by Dr. Meda Coffee.  Pt was more than gracious for all the assistance provided. Pt agreed to this plan.

## 2019-02-17 NOTE — Progress Notes (Signed)
Cardiology Office Note:    Date:  02/17/2019   ID:  Anne Barnes, DOB 04-21-1946, MRN [id number]  PCP:  Mosie Lukes, MD  Cardiologist:  Ena Dawley, MD  Referring MD: Mosie Lukes, MD   Chief Complaint  Patient presents with   Shortness of Breath    History of Present Illness:    Anne Barnes is a 73 y.o. female with a past medical history significant for  HTN, hyperglycemia, mild HLD, osteoarthritis, tinnitus, IBS, OSA intolerant to CPAP and headaches thought to be allergies. Dr. Annamaria Boots follows her sleep apnea. Echo done 02/2017 EF was 60-65% no RWMA, No significant valvular abnormalities. She was seen in June 2018 for bradycardia down to low 50's while on beta blocker. She also had fatigue and atenolol was stopped with improvement.  She was last seen in the office on 01/15/19 by Dr. Meda Coffee and was noted to be walking 20-60 minutes per day without chest pain or shortness of breath. She was having some dizziness mostly when her BP is elevated. She also was having some muscle cramping since starting spironolactone but preferred to continue it. Hydralazine was increased for better BP control from prn to 25 mg TID. She has been having lower extremity edema, however, could not tolerate lasix in the past. Her edema was thought to be slightly better with spironolactone.  She recently called her pulmonologist on 5/22 for not feeling well- having SOB w/ and w/o exertion, lightheadedness, weakness, mucus built up in throat, wheezing and clear nasal drainage for several days now. She had no fever. She was given a short course of prednisone.   Anne Barnes is here today for further evaluation of shortness of breath. No chest pain/pressure. She continues to walk every day without exertional chest discomfort or shortness of breath.   She has been having non-exertional shortness of breath that she relates to allergies, worse recently. Her inhaler has been helping her and she feels sure that  her issues are allergy related and not heart. Feels like the prednisone has helped.   Everything she eats and drinks causes increase in mucous and cough. Has been occurring for up to a year, coming and going. Her pulmonologist has been switching up her meds. She has had allergy testing and has multiple environmental allergies and is intolerant to gluten.  She sleeps with CPAP and an air purifier every night.   She also has had some brief pain in the left side of her head and neck and left shoulder issues (has had for years). She has trouble lifting her great grandchild. She feels that her muscle is straining from her shoulder issues. She does not have any neurologic deficits.   Wt fluctuates about 5 lbs. She stays away from salt but occ eats processed food with likely hidden salt.  She sleeps in a recliner, she says due to allergies for at least the last year. She cannot sleep in bedroom with her husband because he likes the room too warm and stuffy.   Past Medical History:  Diagnosis Date   Acute combined systolic and diastolic heart failure ([place]) 05/26/2015   ALLERGIC RHINITIS    Allergic urticaria 08/01/2014   Patient reports allergies to shrimp, egg whites, tomatoes, dairy    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   Office Spirometry 11/07/17-WNL-FVC 1.58/82%, FEV1 1.27/85%, ratio 0.80, FEF 25-75% 0.26/89%   Bradycardia 02/25/2017   Chiari malformation    Noted MRI brain 09/2013 - s/p neuro eval for same  DIVERTICULOSIS, COLON    Essential hypertension 08/28/2010   Qualifier: Diagnosis of  By: Marca Ancona RMA, Lucy      Frequent headaches    GERD    H/O measles    H/O mumps    Hearing loss 01/18/2017   History of chicken pox    Hyperglycemia 03/05/2016   HYPERTENSION    Hypocalcemia 02/25/2017   IBS (irritable bowel syndrome) 05/26/2015   Nonspecific abnormal electrocardiogram (ECG) (EKG)    OBSTRUCTIVE SLEEP APNEA 12/2008 dx   noncompliant with CPAP qhs   Obstructive sleep  apnea 08/28/2010   NPSG 12/2008:  AHI 13/hr with desats to 78%    Preventative health care 06/11/2017   Sciatica    right side   Seasonal and perennial allergic rhinitis 08/28/2010   Allergy profile 03/11/14- positive especially for dust, cat and dog Food allergy profile- total IgE 86 with several food group elevations Sed rate-WNL      Sinusitis 06/06/2017   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   Vaginitis 01/18/2017   Vitamin D deficiency 06/11/2017    Past Surgical History:  Procedure Laterality Date   [place]   Breast biopsy   UMBILICAL HERNIA REPAIR      Current Medications: Current Meds  Medication Sig   albuterol (PROVENTIL HFA;VENTOLIN HFA) 108 (90 Base) MCG/ACT inhaler Inhale 2 puffs into the lungs every 6 (six) hours as needed for wheezing or shortness of breath.   amLODipine (NORVASC) 2.5 MG tablet Take 1 tablet (2.5 mg total) by mouth 2 (two) times daily.   b complex vitamins tablet Take 1 tablet by mouth daily.   cetirizine (ZYRTEC) 10 MG tablet Take 1 tablet (10 mg total) by mouth daily.   Cholecalciferol (VITAMIN D) 2000 UNITS CAPS Take by mouth daily.    hydrALAZINE (APRESOLINE) 25 MG tablet Take 1 tablet (25 mg total) by mouth 3 (three) times daily.   ipratropium (ATROVENT) 0.06 % nasal spray 1-2 puffs each nostril twice daily as needed   losartan (COZAAR) 100 MG tablet TAKE 1 TABLET BY MOUTH ONCE DAILY   Multiple Vitamin (MULTIVITAMIN) tablet Take 1 tablet by mouth daily.     Omega 3-6-9 Fatty Acids (OMEGA 3-6-9 COMPLEX PO) Take 1 capsule by mouth daily.     omeprazole (PRILOSEC) 20 MG capsule TAKE 1 CAPSULE BY MOUTH ONCE DAILY   predniSONE (DELTASONE) 20 MG tablet Take 1 tablet daily for 5 days.   spironolactone (ALDACTONE) 25 MG tablet Take 0.5 tablets (12.5 mg total) by mouth daily.   Vitamin D, Ergocalciferol, (DRISDOL) 50000 units CAPS capsule Take 1 capsule (50,000 Units total) by mouth every 7 (seven)  days.     Allergies:   Codeine; Lisinopril-hydrochlorothiazide; Metoprolol; and Other   Social History   Socioeconomic History   Marital status: Married    Spouse name: Not on file   Number of children: Not on file   Years of education: Not on file   Highest education level: Not on file  Occupational History   Occupation: retired  Scientist, product/process development strain: Not on file   Food insecurity:    Worry: Not on file    Inability: Not on file   Transportation needs:    Medical: Not on file    Non-medical: Not on file  Tobacco Use   Smoking status: Never Smoker   Smokeless tobacco: Never Used   Tobacco comment: Married, lives with spouse, dtr 2 g-kids.  Substance and Sexual Activity   Alcohol use: No    Comment: rare   Drug use: No   Sexual activity: Not Currently    Comment: lives with husband, no dietary restrictions avoid foods allergic to, retired  Lifestyle   Physical activity:    Days per week: Not on file    Minutes per session: Not on file   Stress: Not on file  Relationships   Social connections:    Talks on phone: Not on file    Gets together: Not on file    Attends religious service: Not on file    Active member of club or organization: Not on file    Attends meetings of clubs or organizations: Not on file    Relationship status: Not on file  Other Topics Concern   Not on file  Social History Narrative   Not on file     Family History: The patient's family history includes Alcohol abuse in her brother and another family member; Arthritis in her father and mother; Diabetes in her brother; Fibromyalgia in her daughter; Berenice Primas' disease in her daughter; Heart attack in her brother; Heart disease in her brother and father; Hypertension in her daughter, daughter, maternal grandfather, and sister; Kidney disease in her father, sister, and sister; Lung cancer in her brother; Lupus in her daughter; Obesity in her sister; Pneumonia in her  brother and father; Rheum arthritis in her daughter; Sjogren's syndrome in her daughter; Throat cancer in her brother. There is no history of Ataxia, Chorea, Dementia, Mental retardation, Migraines, Multiple sclerosis, Neurofibromatosis, Neuropathy, Parkinsonism, Seizures, or Stroke. ROS:   Please see the history of present illness.     All other systems reviewed and are negative.  EKGs/Labs/Other Studies Reviewed:    The following studies were reviewed today:  Nuclear stress test 03/26/2017 Study Highlights    The left ventricular ejection fraction is hyperdynamic (>65%).  Nuclear stress EF: 66%.  No T wave inversion was noted during stress.  There was no ST segment deviation noted during stress.  Defect 1: There is a small defect of mild severity.   Small size, mild intensity fixed apical lateral and inferolateral perfusion defect, likely attenuation artifact. No significant reversible ischemia. LVEF 66% with normal wall motion. This is a low risk study.    Echocardiogram 03/06/17 Study Conclusions - Left ventricle: The cavity size was normal. Systolic function was   normal. The estimated ejection fraction was in the range of 60%   to 65%. Wall motion was normal; there were no regional wall   motion abnormalities. There was no evidence of elevated   ventricular filling pressure by Doppler parameters.  EKG:  EKG is  ordered today.  The ekg ordered today demonstrates NSR with PAC, no ischemic changes.   Recent Labs: 07/14/2018: TSH 1.11 02/17/2019: ALT 18; BUN 18; Creatinine, Ser 0.86; Hemoglobin 13.0; NT-Pro BNP WILL FOLLOW; Platelets 284; Potassium 4.1; Sodium 141   Recent Lipid Panel    Component Value Date/Time   CHOL 155 07/14/2018 0959   TRIG 46.0 07/14/2018 0959   TRIG 55 12/30/2009   HDL 42.00 07/14/2018 0959   CHOLHDL 4 07/14/2018 0959   VLDL 9.2 07/14/2018 0959   LDLCALC 104 (H) 07/14/2018 0959    Physical Exam:    VS:  BP 138/72    Pulse 78    Ht 4'  11.5" (1.511 m)    Wt 228 lb (103.4 kg)    SpO2 97%    BMI 45.28 kg/m  Wt Readings from Last 3 Encounters:  02/17/19 228 lb (103.4 kg)  01/15/19 225 lb 1.6 oz (102.1 kg)  12/05/18 227 lb 12.8 oz (103.3 kg)     Physical Exam  Constitutional: She is oriented to person, place, and time. She appears well-developed and well-nourished. No distress.  Obese, centrally  HENT:  Head: Normocephalic and atraumatic.  Neck: Normal range of motion. Neck supple. No JVD present.  Cardiovascular: Normal rate, regular rhythm, normal heart sounds and intact distal pulses. Exam reveals no gallop and no friction rub.  No murmur heard. Pulmonary/Chest: Effort normal and breath sounds normal. No respiratory distress. She has no wheezes. She has no rales.  Abdominal: Soft. Bowel sounds are normal.  Musculoskeletal:        General: Edema present.     Comments: Trace-1+ lower leg edema, L>R  Neurological: She is alert and oriented to person, place, and time.  Skin: Skin is warm and dry.  Psychiatric: She has a normal mood and affect. Her behavior is normal. Judgment and thought content normal.  Nursing note and vitals reviewed.   ASSESSMENT:    1. Shortness of breath   2. Essential hypertension   3. Lower extremity edema   4. Hyperlipidemia, unspecified hyperlipidemia type   5. OSA on CPAP    PLAN:    In order of problems listed above:  1. Shortness of breath -Pt with increase in shortness of breath, although not with activity, and cough with mucous in throat. She feels that this is all related to allergies which have long given her problems. Symptoms improve with the use of her inhaler and are better with steroid.  -Echo in 02/2017 showed normal EF and filling pressures.  -Wt is not changed much. Pt reports edema is at her baseline.  -Allergies and body habitus with short stature and central obesity likely contributing.  -Will check BNP, CMet, CBC -Pt has not toelrated lasix in the past due to  causing muscle cramping but Could take lasix for a couple days if needed.   2. Hypertension -On amlodipine 2.5 mg BID, hydralazine 25 mg TID, losartan 100 mg Daily and spironolactone 12.5 mg daily. -BP better controlled with increase in hydralazine.   3. Bilateral lower extremity edema -Pt reports that is at her baseline.   4. Hyperlipidemia -LDL, treated with diet and exercise. Pt watches her carb and fat intake. She is intolerant to gluten so no wheat products.   5. OSA -Compliant with CPAP.   Medication Adjustments/Labs and Tests Ordered: Current medicines are reviewed at length with the patient today.  Concerns regarding medicines are outlined above. Labs and tests ordered and medication changes are outlined in the patient instructions below:  Patient Instructions  Medication Instructions:  Your physician recommends that you continue on your current medications as directed. Please refer to the Current Medication list given to you today.  If you need a refill on your cardiac medications before your next appointment, please call your pharmacy.   Lab work: TODAY: CMET, CBC W/ DIFF & PRO BNP  If you have labs (blood work) drawn today and your tests are completely normal, you will receive your results only by:  [place] (if you have MyChart) OR  A paper copy in the mail If you have any lab test that is abnormal or we need to change your treatment, we will call you to review the results.  Testing/Procedures: NONE  Follow-Up: You are scheduled for a virtual visit with Dr. Meda Coffee  on 04/17/2019  Any Other Special Instructions Will Be Listed Below (If Applicable).  DASH Eating Plan DASH stands for "Dietary Approaches to Stop Hypertension." The DASH eating plan is a healthy eating plan that has been shown to reduce high blood pressure (hypertension). It may also reduce your risk for type 2 diabetes, heart disease, and stroke. The DASH eating plan may also help with weight  loss. What are tips for following this plan?  General guidelines  Avoid eating more than 2,300 mg (milligrams) of salt (sodium) a day. If you have hypertension, you may need to reduce your sodium intake to 1,500 mg a day.  Limit alcohol intake to no more than 1 drink a day for nonpregnant women and 2 drinks a day for men. One drink equals 12 oz of beer, 5 oz of wine, or 1 oz of hard liquor.  Work with your health care provider to maintain a healthy body weight or to lose weight. Ask what an ideal weight is for you.  Get at least 30 minutes of exercise that causes your heart to beat faster (aerobic exercise) most days of the week. Activities may include walking, swimming, or biking.  Work with your health care provider or diet and nutrition specialist (dietitian) to adjust your eating plan to your individual calorie needs. Reading food labels   Check food labels for the amount of sodium per serving. Choose foods with less than 5 percent of the Daily Value of sodium. Generally, foods with less than 300 mg of sodium per serving fit into this eating plan.  To find whole grains, look for the word "whole" as the first word in the ingredient list. Shopping  Buy products labeled as "low-sodium" or "no salt added."  Buy fresh foods. Avoid canned foods and premade or frozen meals. Cooking  Avoid adding salt when cooking. Use salt-free seasonings or herbs instead of table salt or sea salt. Check with your health care provider or pharmacist before using salt substitutes.  Do not fry foods. Cook foods using healthy methods such as baking, boiling, grilling, and broiling instead.  Cook with heart-healthy oils, such as olive, canola, soybean, or sunflower oil. Meal planning  Eat a balanced diet that includes: ? 5 or more servings of fruits and vegetables each day. At each meal, try to fill half of your plate with fruits and vegetables. ? Up to 6-8 servings of whole grains each day. ? Less than  6 oz of lean meat, poultry, or fish each day. A 3-oz serving of meat is about the same size as a deck of cards. One egg equals 1 oz. ? 2 servings of low-fat dairy each day. ? A serving of nuts, seeds, or beans 5 times each week. ? Heart-healthy fats. Healthy fats called Omega-3 fatty acids are found in foods such as flaxseeds and coldwater fish, like sardines, salmon, and mackerel.  Limit how much you eat of the following: ? Canned or prepackaged foods. ? Food that is high in trans fat, such as fried foods. ? Food that is high in saturated fat, such as fatty meat. ? Sweets, desserts, sugary drinks, and other foods with added sugar. ? Full-fat dairy products.  Do not salt foods before eating.  Try to eat at least 2 vegetarian meals each week.  Eat more home-cooked food and less restaurant, buffet, and fast food.  When eating at a restaurant, ask that your food be prepared with less salt or no salt, if possible. What foods are  recommended? The items listed may not be a complete list. Talk with your dietitian about what dietary choices are best for you. Grains Whole-grain or whole-wheat bread. Whole-grain or whole-wheat pasta. Brown rice. [place]. Bulgur. Whole-grain and low-sodium cereals. Pita bread. Low-fat, low-sodium crackers. Whole-wheat flour tortillas. Vegetables Fresh or frozen vegetables (raw, steamed, roasted, or grilled). Low-sodium or reduced-sodium tomato and vegetable juice. Low-sodium or reduced-sodium tomato sauce and tomato paste. Low-sodium or reduced-sodium canned vegetables. Fruits All fresh, dried, or frozen fruit. Canned fruit in natural juice (without added sugar). Meat and other protein foods Skinless chicken or [place]. Ground chicken or [place]. Pork with fat trimmed off. Fish and seafood. Egg whites. Dried beans, peas, or lentils. Unsalted nuts, nut butters, and seeds. Unsalted canned beans. Lean cuts of beef with fat trimmed off. Low-sodium, lean deli  meat. Dairy Low-fat (1%) or fat-free (skim) milk. Fat-free, low-fat, or reduced-fat cheeses. Nonfat, low-sodium ricotta or cottage cheese. Low-fat or nonfat yogurt. Low-fat, low-sodium cheese. Fats and oils Soft margarine without trans fats. Vegetable oil. Low-fat, reduced-fat, or light mayonnaise and salad dressings (reduced-sodium). Canola, safflower, olive, soybean, and sunflower oils. Avocado. Seasoning and other foods Herbs. Spices. Seasoning mixes without salt. Unsalted popcorn and pretzels. Fat-free sweets. What foods are not recommended? The items listed may not be a complete list. Talk with your dietitian about what dietary choices are best for you. Grains Baked goods made with fat, such as croissants, muffins, or some breads. Dry pasta or rice meal packs. Vegetables Creamed or fried vegetables. Vegetables in a cheese sauce. Regular canned vegetables (not low-sodium or reduced-sodium). Regular canned tomato sauce and paste (not low-sodium or reduced-sodium). Regular tomato and vegetable juice (not low-sodium or reduced-sodium). Angie Fava. Olives. Fruits Canned fruit in a light or heavy syrup. Fried fruit. Fruit in cream or butter sauce. Meat and other protein foods Fatty cuts of meat. Ribs. Fried meat. Berniece Salines. Sausage. Bologna and other processed lunch meats. Salami. Fatback. Hotdogs. Bratwurst. Salted nuts and seeds. Canned beans with added salt. Canned or smoked fish. Whole eggs or egg yolks. Chicken or [place] with skin. Dairy Whole or 2% milk, cream, and half-and-half. Whole or full-fat cream cheese. Whole-fat or sweetened yogurt. Full-fat cheese. Nondairy creamers. Whipped toppings. Processed cheese and cheese spreads. Fats and oils Butter. Stick margarine. Lard. Shortening. Ghee. Bacon fat. Tropical oils, such as coconut, palm kernel, or palm oil. Seasoning and other foods Salted popcorn and pretzels. Onion salt, garlic salt, seasoned salt, table salt, and sea salt. Worcestershire  sauce. Tartar sauce. Barbecue sauce. Teriyaki sauce. Soy sauce, including reduced-sodium. Steak sauce. Canned and packaged gravies. Fish sauce. Oyster sauce. Cocktail sauce. Horseradish that you find on the shelf. Ketchup. Mustard. Meat flavorings and tenderizers. Bouillon cubes. Hot sauce and Tabasco sauce. Premade or packaged marinades. Premade or packaged taco seasonings. Relishes. Regular salad dressings. Where to find more information:  National Heart, Lung, and [place]: https://wilson-eaton.com/  American Heart Association: www.heart.org Summary  The DASH eating plan is a healthy eating plan that has been shown to reduce high blood pressure (hypertension). It may also reduce your risk for type 2 diabetes, heart disease, and stroke.  With the DASH eating plan, you should limit salt (sodium) intake to 2,300 mg a day. If you have hypertension, you may need to reduce your sodium intake to 1,500 mg a day.  When on the DASH eating plan, aim to eat more fresh fruits and vegetables, whole grains, lean proteins, low-fat dairy, and heart-healthy fats.  Work with your health  care provider or diet and nutrition specialist (dietitian) to adjust your eating plan to your individual calorie needs. This information is not intended to replace advice given to you by your health care provider. Make sure you discuss any questions you have with your health care provider. Document Released: 08/30/2011 Document Revised: 09/03/2016 Document Reviewed: 09/03/2016 Elsevier Interactive Patient Education  2019 [place], Daune Perch, NP  02/17/2019 5:10 PM    [place]

## 2019-02-18 LAB — CBC WITH DIFFERENTIAL/PLATELET
Basophils Absolute: 0.1 10*3/uL (ref 0.0–0.2)
Basos: 1 %
EOS (ABSOLUTE): 0.3 10*3/uL (ref 0.0–0.4)
Eos: 3 %
Hematocrit: 39.5 % (ref 34.0–46.6)
Hemoglobin: 13 g/dL (ref 11.1–15.9)
Immature Grans (Abs): 0 10*3/uL (ref 0.0–0.1)
Immature Granulocytes: 0 %
Lymphocytes Absolute: 2.1 10*3/uL (ref 0.7–3.1)
Lymphs: 20 %
MCH: 29.1 pg (ref 26.6–33.0)
MCHC: 32.9 g/dL (ref 31.5–35.7)
MCV: 89 fL (ref 79–97)
Monocytes Absolute: 1 10*3/uL — ABNORMAL HIGH (ref 0.1–0.9)
Monocytes: 10 %
Neutrophils Absolute: 7.3 10*3/uL — ABNORMAL HIGH (ref 1.4–7.0)
Neutrophils: 66 %
Platelets: 284 10*3/uL (ref 150–450)
RBC: 4.46 x10E6/uL (ref 3.77–5.28)
RDW: 12.3 % (ref 11.7–15.4)
WBC: 10.9 10*3/uL — ABNORMAL HIGH (ref 3.4–10.8)

## 2019-02-18 LAB — COMPREHENSIVE METABOLIC PANEL
ALT: 18 IU/L (ref 0–32)
AST: 13 IU/L (ref 0–40)
Albumin/Globulin Ratio: 1.5 (ref 1.2–2.2)
Albumin: 4 g/dL (ref 3.7–4.7)
Alkaline Phosphatase: 82 IU/L (ref 39–117)
BUN/Creatinine Ratio: 21 (ref 12–28)
BUN: 18 mg/dL (ref 8–27)
Bilirubin Total: 0.4 mg/dL (ref 0.0–1.2)
CO2: 25 mmol/L (ref 20–29)
Calcium: 8.9 mg/dL (ref 8.7–10.3)
Chloride: 103 mmol/L (ref 96–106)
Creatinine, Ser: 0.86 mg/dL (ref 0.57–1.00)
GFR calc Af Amer: 79 mL/min/{1.73_m2} (ref >59)
GFR calc non Af Amer: 68 mL/min/{1.73_m2} (ref >59)
Globulin, Total: 2.6 g/dL (ref 1.5–4.5)
Glucose: 94 mg/dL (ref 65–99)
Potassium: 4.1 mmol/L (ref 3.5–5.2)
Sodium: 141 mmol/L (ref 134–144)
Total Protein: 6.6 g/dL (ref 6.0–8.5)

## 2019-02-18 LAB — PRO B NATRIURETIC PEPTIDE: NT-Pro BNP: 58 pg/mL (ref 0–301)

## 2019-03-10 ENCOUNTER — Telehealth: Payer: Self-pay | Admitting: Family Medicine

## 2019-03-11 ENCOUNTER — Ambulatory Visit (INDEPENDENT_AMBULATORY_CARE_PROVIDER_SITE_OTHER): Payer: Medicare HMO | Admitting: Medical

## 2019-03-11 ENCOUNTER — Telehealth: Payer: Self-pay | Admitting: Family Medicine

## 2019-03-11 ENCOUNTER — Other Ambulatory Visit: Payer: Self-pay

## 2019-03-11 ENCOUNTER — Encounter: Payer: Self-pay | Admitting: Medical

## 2019-03-11 VITALS — BP 135/66 | HR 67 | Temp 97.6°F | Resp 16 | Ht 59.5 in | Wt 224.2 lb

## 2019-03-11 DIAGNOSIS — J0100 Acute maxillary sinusitis, unspecified: Secondary | ICD-10-CM | POA: Diagnosis not present

## 2019-03-11 DIAGNOSIS — J3089 Other allergic rhinitis: Secondary | ICD-10-CM | POA: Diagnosis not present

## 2019-03-11 DIAGNOSIS — J302 Other seasonal allergic rhinitis: Secondary | ICD-10-CM | POA: Diagnosis not present

## 2019-03-11 MED ORDER — AMOXICILLIN-POT CLAVULANATE 875-125 MG PO TABS: 1.0000 | ORAL_TABLET | Freq: Two times a day (BID) | ORAL | 0 refills | Status: DC

## 2019-03-11 MED ORDER — PREDNISONE 10 MG PO TABS: ORAL_TABLET | ORAL | 0 refills | Status: DC

## 2019-03-11 NOTE — Patient Instructions (Signed)
You appear to have a sinus infection with allergies as well. I am prescribing augmentin  antibiotic for the infection. To help with the nasal congestion use flonase nasal steroid and use xyzal. Will rx low dose 4 day taper prednisone to help with allergies as well.  I don't think you present like covid as you stay at home near 100% of the time but some person in house do go out. So will follow you closely. If signs ans symptoms change or worsen then notify us as in that event may need covid testing.  Rest, hydrate, tylenol for fever.  Follow up in 7 days or as needed.

## 2019-03-11 NOTE — Progress Notes (Signed)
Subjective:    Patient ID: Anne Barnes, female    DOB: May 11, 1946, 73 y.o.   MRN: [id number]  HPI  Virtual Visit via Video Note  I connected with Anne Barnes on 03/11/19 at  1:20 PM EDT by a video enabled telemedicine application and verified that I am speaking with the correct person using two identifiers.  Location: Patient: home Provider: home   I discussed the limitations of evaluation and management by telemedicine and the availability of in person appointments. The patient expressed understanding and agreed to proceed.  History of Present Illness:   Pt states she has some nasal congestion, stuffy nose, mild intermittent light headed,  Faint ha when cough or blows nose. She states maxillary sinus pressure. Some mucus when blows nose. No fever, no chills or sweating.  Some wheezing intermittent but not severe. Not worse than usual. She has albuterol to use if needed. Uses 1-2 times at most. Has not used any today. Just used at night last night.  Pt has staying home. She only goes to grocery store rarely. Daughter doing the shopping. Some family work and go out then come home. No one is sick.  Pt is on xyzal and flonase. But flonase sporadic. She just restarted that.    Observations/Objective: General-no acute distress, pleasant, oriented. Lungs- on inspection lungs appear unlabored. Neck- no tracheal deviation or jvd on inspection. Neuro- gross motor function appears intact. heent- maxillary sinus pressure/faint pain when she palpates. No frontal sinus pressure/pain. Sounds mild congested nasaly.  Assessment and Plan: You appear to have a sinus infection with allergies as well. I am prescribing augmentin  antibiotic for the infection. To help with the nasal congestion use flonase nasal steroid and use xyzal. Will rx low dose 4 day taper prednisone to help with allergies as well.  I don't think you present like covid as you stay at home near 100% of the time but some  person in house do go out. So will follow you closely. If signs ans symptoms change or worsen then notify us as in that event may need covid testing.  Rest, hydrate, tylenol for fever.  Follow up in 7 days or as needed.  Follow Up Instructions:    I discussed the assessment and treatment plan with the patient. The patient was provided an opportunity to ask questions and all were answered. The patient agreed with the plan and demonstrated an understanding of the instructions.   The patient was advised to call back or seek an in-person evaluation if the symptoms worsen or if the condition fails to improve as anticipated.  I provided *15 minutes of non-face-to-face time during this encounter.   Mackie Pai, PA-C   Review of Systems  Constitutional: Negative for chills, fatigue and fever.  HENT: Positive for congestion, postnasal drip, sinus pressure and sneezing. Negative for ear pain, sore throat and trouble swallowing.   Respiratory: Positive for cough. Negative for chest tightness, shortness of breath and wheezing.   Cardiovascular: Negative for chest pain and palpitations.  Gastrointestinal: Negative for abdominal pain.  Musculoskeletal: Negative for myalgias.  Neurological: Positive for headaches. Negative for seizures, weakness and numbness.       Rare intermittent mild light headed.  Hematological: Negative for adenopathy. Does not bruise/bleed easily.  Psychiatric/Behavioral: Negative for behavioral problems and confusion.   Past Medical History:  Diagnosis Date  . Acute combined systolic and diastolic heart failure ([place]) 05/26/2015  . ALLERGIC RHINITIS   . Allergic urticaria 08/01/2014  Patient reports allergies to shrimp, egg whites, tomatoes, dairy   . 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   Office Spirometry 11/07/17-WNL-FVC 1.58/82%, FEV1 1.27/85%, ratio 0.80, FEF 25-75% 0.26/89%  . Bradycardia 02/25/2017  . Chiari malformation    Noted MRI brain 09/2013 - s/p neuro eval  for same  . DIVERTICULOSIS, COLON   . Essential hypertension 08/28/2010   Qualifier: Diagnosis of  By: Marca Ancona RMA, Lucy     . Frequent headaches   . GERD   . H/O measles   . H/O mumps   . Hearing loss 01/18/2017  . History of chicken pox   . Hyperglycemia 03/05/2016  . HYPERTENSION   . Hypocalcemia 02/25/2017  . IBS (irritable bowel syndrome) 05/26/2015  . Nonspecific abnormal electrocardiogram (ECG) (EKG)   . OBSTRUCTIVE SLEEP APNEA 12/2008 dx   noncompliant with CPAP qhs  . Obstructive sleep apnea 08/28/2010   NPSG 12/2008:  AHI 13/hr with desats to 78%   . Preventative health care 06/11/2017  . Sciatica    right side  . Seasonal and perennial allergic rhinitis 08/28/2010   Allergy profile 03/11/14- positive especially for dust, cat and dog Food allergy profile- total IgE 86 with several food group elevations Sed rate-WNL     . Sinusitis 06/06/2017  . 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 Vaginitis 01/18/2017  . Vitamin D deficiency 06/11/2017     Social History   Socioeconomic History  . Marital status: Married    Spouse name: Not on file  . Number of children: Not on file  . Years of education: Not on file  . Highest education level: Not on file  Occupational History  . Occupation: retired  Scientific laboratory technician  . Financial resource strain: Not on file  . Food insecurity    Worry: Not on file    Inability: Not on file  . Transportation needs    Medical: Not on file    Non-medical: Not on file  Tobacco Use  . Smoking status: Never Smoker  . Smokeless tobacco: Never Used  . Tobacco comment: Married, lives with spouse, dtr 2 g-kids.  Substance and Sexual Activity  . Alcohol use: No    Comment: rare  . Drug use: No  . Sexual activity: Not Currently    Comment: lives with husband, no dietary restrictions avoid foods allergic to, retired  Lifestyle  . Physical activity    Days per week: Not on file    Minutes per session: Not on file  . Stress: Not on file  Relationships  . Social  Herbalist on phone: Not on file    Gets together: Not on file    Attends religious service: Not on file    Active member of club or organization: Not on file    Attends meetings of clubs or organizations: Not on file    Relationship status: Not on file  . Intimate partner violence    Fear of current or ex partner: Not on file    Emotionally abused: Not on file    Physically abused: Not on file    Forced sexual activity: Not on file  Other Topics Concern  . Not on file  Social History Narrative  . Not on file    Past Surgical History:  Procedure Laterality Date  . ABDOMINAL HYSTERECTOMY  1986  . BREAST SURGERY  1973   Breast biopsy  . UMBILICAL HERNIA REPAIR      Family History  Problem Relation Age of  Onset  . Arthritis Mother        died of complications from hip surgery  . Arthritis Father   . Kidney disease Father   . Pneumonia Father   . Heart disease Father   . Lupus Daughter   . Rheum arthritis Daughter   . Sjogren's syndrome Daughter   . Fibromyalgia Daughter   . Diabetes Brother   . Heart disease Brother   . Heart attack Brother   . Alcohol abuse Brother   . Throat cancer Brother   . Lung cancer Brother   . Kidney disease Sister        dialysis  . Obesity Sister   . Hypertension Maternal Grandfather   . Hypertension Sister   . Kidney disease Sister        dialysis  . Pneumonia Brother   . Hypertension Daughter   . Graves' disease Daughter   . Hypertension Daughter   . Alcohol abuse Other        parent  . Ataxia Neg Hx   . Chorea Neg Hx   . Dementia Neg Hx   . Mental retardation Neg Hx   . Migraines Neg Hx   . Multiple sclerosis Neg Hx   . Neurofibromatosis Neg Hx   . Neuropathy Neg Hx   . Parkinsonism Neg Hx   . Seizures Neg Hx   . Stroke Neg Hx     Allergies  Allergen Reactions  . Codeine Nausea And Vomiting  . Lisinopril-Hydrochlorothiazide Hives    REACTION: lip swelling,facial swelling,rash.  . Metoprolol Other (See  Comments)    Does not feel well on it   . Other     Patient states she is allergic to Shrimp, egg whites, tomatoes and dairy products.  Says they make her feel unwell but do not cause hives, SOB or anaphylaxis.  She just avoids eating them because they can make her have a headache.    Current Outpatient Medications on File Prior to Visit  Medication Sig Dispense Refill  . albuterol (PROVENTIL HFA;VENTOLIN HFA) 108 (90 Base) MCG/ACT inhaler Inhale 2 puffs into the lungs every 6 (six) hours as needed for wheezing or shortness of breath. 1 Inhaler 12  . amLODipine (NORVASC) 2.5 MG tablet Take 1 tablet (2.5 mg total) by mouth 2 (two) times daily. 180 tablet 2  . b complex vitamins tablet Take 1 tablet by mouth daily.    . cetirizine (ZYRTEC) 10 MG tablet Take 1 tablet (10 mg total) by mouth daily. 30 tablet 11  . Cholecalciferol (VITAMIN D) 2000 UNITS CAPS Take by mouth daily.     . hydrALAZINE (APRESOLINE) 25 MG tablet Take 1 tablet (25 mg total) by mouth 3 (three) times daily. 270 tablet 3  . ipratropium (ATROVENT) 0.06 % nasal spray 1-2 puffs each nostril twice daily as needed 15 mL 12  . losartan (COZAAR) 100 MG tablet TAKE 1 TABLET BY MOUTH ONCE DAILY 90 tablet 0  . Multiple Vitamin (MULTIVITAMIN) tablet Take 1 tablet by mouth daily.      . Omega 3-6-9 Fatty Acids (OMEGA 3-6-9 COMPLEX PO) Take 1 capsule by mouth daily.      Marland Kitchen omeprazole (PRILOSEC) 20 MG capsule TAKE 1 CAPSULE BY MOUTH ONCE DAILY 90 capsule 3  . spironolactone (ALDACTONE) 25 MG tablet Take 0.5 tablets (12.5 mg total) by mouth daily. 30 tablet 3  . Vitamin D, Ergocalciferol, (DRISDOL) 50000 units CAPS capsule Take 1 capsule (50,000 Units total) by mouth every 7 (seven)  days. 12 capsule 1   No current facility-administered medications on file prior to visit.     BP 135/66   Pulse 67   Temp 97.6 F (36.4 C) (Oral)   Resp 16   Ht 4' 11.5" (1.511 m)   Wt 224 lb 3.2 oz (101.7 kg)   BMI 44.53 kg/m       Objective:    Physical Exam        Assessment & Plan:

## 2019-03-11 NOTE — Telephone Encounter (Signed)
LVM for pt to call and schedule VOV for pt with issue of sinus pressure.

## 2019-03-14 DIAGNOSIS — G4733 Obstructive sleep apnea (adult) (pediatric): Secondary | ICD-10-CM | POA: Diagnosis not present

## 2019-03-25 ENCOUNTER — Telehealth: Payer: Self-pay | Admitting: Family Medicine

## 2019-03-25 DIAGNOSIS — G4733 Obstructive sleep apnea (adult) (pediatric): Secondary | ICD-10-CM | POA: Diagnosis not present

## 2019-03-25 NOTE — Telephone Encounter (Signed)
Pt was seen by Dr. Harvie Heck for a sinus infection and was prescribed amoxicillin-clavulanate (AUGMENTIN) 875-125 MG tablet  Pt is feeling better but still has mucus in her head and her right ear is clogged. Pt would like to know if another round of the medication above can be sent to the pharmacy or if he advises something else. Please advise

## 2019-03-25 NOTE — Telephone Encounter (Signed)
Please advise 

## 2019-03-26 MED ORDER — AMOXICILLIN-POT CLAVULANATE 875-125 MG PO TABS: 1.0000 | ORAL_TABLET | Freq: Two times a day (BID) | ORAL | 0 refills | Status: DC

## 2019-03-26 NOTE — Telephone Encounter (Signed)
Will you let pt know I sent in 5 days addition augmentin. If not better by mid week then please schedule virtual visit by Wednesday or thursday

## 2019-03-30 ENCOUNTER — Other Ambulatory Visit: Payer: Self-pay | Admitting: Family Medicine

## 2019-04-01 NOTE — Progress Notes (Signed)
Virtual Visit via Video Note  I connected with patient on 04/02/19 at  9:00 AM EDT by audio enabled telemedicine application and verified that I am speaking with the correct person using two identifiers.   THIS ENCOUNTER IS A VIRTUAL VISIT DUE TO COVID-19 - PATIENT WAS NOT SEEN IN THE OFFICE. PATIENT HAS CONSENTED TO VIRTUAL VISIT / TELEMEDICINE VISIT   Location of patient: home  Location of provider: office  I discussed the limitations of evaluation and management by telemedicine and the availability of in person appointments. The patient expressed understanding and agreed to proceed.   Subjective:   Anne Barnes is a 73 y.o. female who presents for Medicare Annual (Subsequent) preventive examination.  49 sits 53 month old great grandson 5 days/ week.  Review of Systems: No ROS.  Medicare Wellness Virtual Visit.  Visual/audio telehealth visit, UTA vital signs.   See social history for additional risk factors. Cardiac Risk Factors include: advanced age (>8men, >30 women);hypertension;obesity (BMI >30kg/m2) Sleep patterns: wears CPAP. Sleeps very well. Home Safety/Smoke Alarms: Feels safe in home. Smoke alarms in place.  Lives with husband. Daughter and 2 sons live with her.   Female:   Mammo-  declines     Dexa scan- 11/22/15. Ordered.      CCS- last 10/21/09    Objective:     Vitals: BP 133/61 Comment: all vitals reported by patient  Pulse 65   Temp 97.6 F (36.4 C)   Wt 214 lb 9.6 oz (97.3 kg)   BMI 42.62 kg/m   Body mass index is 42.62 kg/m.  Advanced Directives 04/02/2019 03/31/2018 09/15/2017 02/19/2017 01/10/2015 05/28/2014  Does Patient Have a Medical Advance Directive? No Yes No No No No  Type of Advance Directive - Healthcare Power of [place];Living will - - - -  Does patient want to make changes to medical advance directive? - No - Patient declined - - - -  Copy of [place] in Chart? - No - copy requested - - - -  Would patient like  information on creating a medical advance directive? Yes (MAU/Ambulatory/Procedural Areas - Information given) - No - Patient declined - No - patient declined information No - patient declined information    Tobacco Social History   Tobacco Use  Smoking Status Never Smoker  Smokeless Tobacco Never Used  Tobacco Comment   Married, lives with spouse, dtr 2 g-kids.     Counseling given: Not Answered Comment: Married, lives with spouse, dtr 2 g-kids.   Clinical Intake:     Pain : No/denies pain                 Past Medical History:  Diagnosis Date  . Acute combined systolic and diastolic heart failure ([place]) 05/26/2015  . ALLERGIC RHINITIS   . Allergic urticaria 08/01/2014   Patient reports allergies to shrimp, egg whites, tomatoes, dairy   . 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   Office Spirometry 11/07/17-WNL-FVC 1.58/82%, FEV1 1.27/85%, ratio 0.80, FEF 25-75% 0.26/89%  . Bradycardia 02/25/2017  . Chiari malformation    Noted MRI brain 09/2013 - s/p neuro eval for same  . DIVERTICULOSIS, COLON   . Essential hypertension 08/28/2010   Qualifier: Diagnosis of  By: Marca Ancona RMA, Lucy     . Frequent headaches   . GERD   . H/O measles   . H/O mumps   . Hearing loss 01/18/2017  . History of chicken pox   . Hyperglycemia 03/05/2016  . HYPERTENSION   .  Hypocalcemia 02/25/2017  . IBS (irritable bowel syndrome) 05/26/2015  . Nonspecific abnormal electrocardiogram (ECG) (EKG)   . OBSTRUCTIVE SLEEP APNEA 12/2008 dx   noncompliant with CPAP qhs  . Obstructive sleep apnea 08/28/2010   NPSG 12/2008:  AHI 13/hr with desats to 78%   . Preventative health care 06/11/2017  . Sciatica    right side  . Seasonal and perennial allergic rhinitis 08/28/2010   Allergy profile 03/11/14- positive especially for dust, cat and dog Food allergy profile- total IgE 86 with several food group elevations Sed rate-WNL     . Sinusitis 06/06/2017  . 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 Vaginitis 01/18/2017  . Vitamin D  deficiency 06/11/2017   Past Surgical History:  Procedure Laterality Date  . ABDOMINAL HYSTERECTOMY  1986  . BREAST SURGERY  1973   Breast biopsy  . UMBILICAL HERNIA REPAIR     Family History  Problem Relation Age of Onset  . Arthritis Mother        died of complications from hip surgery  . Arthritis Father   . Kidney disease Father   . Pneumonia Father   . Heart disease Father   . Lupus Daughter   . Rheum arthritis Daughter   . Sjogren's syndrome Daughter   . Fibromyalgia Daughter   . Diabetes Brother   . Heart disease Brother   . Heart attack Brother   . Alcohol abuse Brother   . Throat cancer Brother   . Lung cancer Brother   . Kidney disease Sister        dialysis  . Obesity Sister   . Hypertension Maternal Grandfather   . Hypertension Sister   . Kidney disease Sister        dialysis  . Pneumonia Brother   . Hypertension Daughter   . Graves' disease Daughter   . Hypertension Daughter   . Alcohol abuse Other        parent  . Ataxia Neg Hx   . Chorea Neg Hx   . Dementia Neg Hx   . Mental retardation Neg Hx   . Migraines Neg Hx   . Multiple sclerosis Neg Hx   . Neurofibromatosis Neg Hx   . Neuropathy Neg Hx   . Parkinsonism Neg Hx   . Seizures Neg Hx   . Stroke Neg Hx    Social History   Socioeconomic History  . Marital status: Married    Spouse name: Not on file  . Number of children: Not on file  . Years of education: Not on file  . Highest education level: Not on file  Occupational History  . Occupation: retired  Scientific laboratory technician  . Financial resource strain: Not on file  . Food insecurity    Worry: Not on file    Inability: Not on file  . Transportation needs    Medical: Not on file    Non-medical: Not on file  Tobacco Use  . Smoking status: Never Smoker  . Smokeless tobacco: Never Used  . Tobacco comment: Married, lives with spouse, dtr 2 g-kids.  Substance and Sexual Activity  . Alcohol use: No    Comment: rare  . Drug use: No  . Sexual  activity: Not Currently    Comment: lives with husband, no dietary restrictions avoid foods allergic to, retired  Lifestyle  . Physical activity    Days per week: Not on file    Minutes per session: Not on file  . Stress: Not on file  Relationships  . Social Herbalist on phone: Not on file    Gets together: Not on file    Attends religious service: Not on file    Active member of club or organization: Not on file    Attends meetings of clubs or organizations: Not on file    Relationship status: Not on file  Other Topics Concern  . Not on file  Social History Narrative  . Not on file    Outpatient Encounter Medications as of 04/02/2019  Medication Sig  . albuterol (PROVENTIL HFA;VENTOLIN HFA) 108 (90 Base) MCG/ACT inhaler Inhale 2 puffs into the lungs every 6 (six) hours as needed for wheezing or shortness of breath.  Marland Kitchen amLODipine (NORVASC) 2.5 MG tablet Take 1 tablet (2.5 mg total) by mouth 2 (two) times daily.  Marland Kitchen b complex vitamins tablet Take 1 tablet by mouth daily.  . Cholecalciferol (VITAMIN D) 2000 UNITS CAPS Take by mouth daily.   . hydrALAZINE (APRESOLINE) 25 MG tablet Take 1 tablet (25 mg total) by mouth 3 (three) times daily.  Marland Kitchen ipratropium (ATROVENT) 0.06 % nasal spray 1-2 puffs each nostril twice daily as needed  . losartan (COZAAR) 100 MG tablet Take 1 tablet by mouth once daily  . Multiple Vitamin (MULTIVITAMIN) tablet Take 1 tablet by mouth daily.    . Omega 3-6-9 Fatty Acids (OMEGA 3-6-9 COMPLEX PO) Take 1 capsule by mouth daily.    Marland Kitchen omeprazole (PRILOSEC) 20 MG capsule TAKE 1 CAPSULE BY MOUTH ONCE DAILY  . spironolactone (ALDACTONE) 25 MG tablet Take 0.5 tablets (12.5 mg total) by mouth daily.  . Vitamin D, Ergocalciferol, (DRISDOL) 50000 units CAPS capsule Take 1 capsule (50,000 Units total) by mouth every 7 (seven) days. (Patient not taking: Reported on 04/02/2019)  . [DISCONTINUED] amoxicillin-clavulanate (AUGMENTIN) 875-125 MG tablet Take 1 tablet by  mouth 2 (two) times daily.  . [DISCONTINUED] cetirizine (ZYRTEC) 10 MG tablet Take 1 tablet (10 mg total) by mouth daily.  . [DISCONTINUED] predniSONE (DELTASONE) 10 MG tablet 4 tab po day 1, 3 tab po day 2, 2 tab po day 3, 1 tab po day 4   No facility-administered encounter medications on file as of 04/02/2019.     Activities of Daily Living In your present state of health, do you have any difficulty performing the following activities: 04/02/2019  Hearing? N  Vision? N  Difficulty concentrating or making decisions? N  Walking or climbing stairs? N  Dressing or bathing? N  Doing errands, shopping? N  Preparing Food and eating ? N  Using the Toilet? N  In the past six months, have you accidently leaked urine? N  Do you have problems with loss of bowel control? N  Managing your Medications? N  Managing your Finances? N  Housekeeping or managing your Housekeeping? N  Some recent data might be hidden    Patient Care Team: Mosie Lukes, MD as PCP - General (Family Medicine) Dorothy Spark, MD as PCP - Cardiology (Cardiology) Juanita Craver, MD (Gastroenterology) Deneise Lever, MD as Consulting Physician (Pulmonary Disease)    Assessment:   This is a routine wellness examination for [place]. Physical assessment deferred to PCP.  Exercise Activities and Dietary recommendations Current Exercise Habits: The patient does not participate in regular exercise at present, Exercise limited by: None identified Diet (meal preparation, eat out, water intake, caffeinated beverages, dairy products, fruits and vegetables): in general, a "healthy" diet  , well balanced, on average, 3 meals per  day. Reports drinking 60oz of water per day.   Goals    . Weight (lb) < 200 lb (90.7 kg)       Fall Risk Fall Risk  04/02/2019 03/31/2018 06/07/2016 05/26/2015 02/26/2013  Falls in the past year? 0 No No No No    Depression Screen PHQ 2/9 Scores 04/02/2019 03/31/2018 06/07/2016 05/26/2015  PHQ - 2 Score 0 0 0  0  PHQ- 9 Score - - - -     Cognitive Function Ad8 score reviewed for issues:  Issues making decisions: no  Less interest in hobbies / activities: no  Repeats questions, stories (family complaining):no  Trouble using ordinary gadgets (microwave, computer, phone):no  Forgets the month or year: no  Mismanaging finances: no  Remembering appts:no  Daily problems with thinking and/or memory:no Ad8 score is=0         Immunization History  Administered Date(s) Administered  . Influenza, High Dose Seasonal PF 07/15/2013, 06/06/2018  . Influenza,inj,Quad PF,6+ Mos 08/29/2015  . Pneumococcal Conjugate-13 11/30/2015  . Pneumococcal Polysaccharide-23 02/26/2013  . Td 09/25/2003  . Tdap 08/29/2015    Screening Tests Health Maintenance  Topic Date Due  . MAMMOGRAM  11/21/2017  . INFLUENZA VACCINE  04/25/2019  . COLONOSCOPY  10/22/2019  . TETANUS/TDAP  08/28/2025  . DEXA SCAN  Completed  . Hepatitis C Screening  Completed  . PNA vac Low Risk Adult  Completed      Plan:   See you next year!  I have ordered your bone density scan.  I have mailed you Advance Directive forms.  Bring a copy of your living will and/or healthcare power of attorney to your next office visit.   Continue to eat heart healthy diet (full of fruits, vegetables, whole grains, lean protein, water--limit salt, fat, and sugar intake) and increase physical activity as tolerated.  Continue doing brain stimulating activities (puzzles, reading, adult coloring books, staying active) to keep memory sharp.     I have personally reviewed and noted the following in the patient's chart:   . Medical and social history . Use of alcohol, tobacco or illicit drugs  . Current medications and supplements . Functional ability and status . Nutritional status . Physical activity . Advanced directives . List of other physicians . Hospitalizations, surgeries, and ER visits in previous 12 months . Vitals .  Screenings to include cognitive, depression, and falls . Referrals and appointments  In addition, I have reviewed and discussed with patient certain preventive protocols, quality metrics, and best practice recommendations. A written personalized care plan for preventive services as well as general preventive health recommendations were provided to patient.     Shela Nevin, [place]  04/02/2019

## 2019-04-02 ENCOUNTER — Ambulatory Visit (INDEPENDENT_AMBULATORY_CARE_PROVIDER_SITE_OTHER): Payer: Medicare HMO | Admitting: *Deleted

## 2019-04-02 ENCOUNTER — Encounter: Payer: Self-pay | Admitting: *Deleted

## 2019-04-02 ENCOUNTER — Other Ambulatory Visit: Payer: Self-pay

## 2019-04-02 VITALS — BP 133/61 | HR 65 | Temp 97.6°F | Wt 214.6 lb

## 2019-04-02 DIAGNOSIS — Z780 Asymptomatic menopausal state: Secondary | ICD-10-CM

## 2019-04-02 DIAGNOSIS — Z0000 Encounter for general adult medical examination without abnormal findings: Secondary | ICD-10-CM | POA: Diagnosis not present

## 2019-04-02 NOTE — Patient Instructions (Signed)
See you next year!  I have ordered your bone density scan.  I have mailed you Advance Directive forms.  Bring a copy of your living will and/or healthcare power of attorney to your next office visit.   Continue to eat heart healthy diet (full of fruits, vegetables, whole grains, lean protein, water--limit salt, fat, and sugar intake) and increase physical activity as tolerated.  Continue doing brain stimulating activities (puzzles, reading, adult coloring books, staying active) to keep memory sharp.    Anne Barnes , Thank you for taking time to come for your Medicare Wellness Visit. I appreciate your ongoing commitment to your health goals. Please review the following plan we discussed and let me know if I can assist you in the future.   These are the goals we discussed: Goals    . Weight (lb) < 200 lb (90.7 kg)       This is a list of the screening recommended for you and due dates:  Health Maintenance  Topic Date Due  . Mammogram  11/21/2017  . Flu Shot  04/25/2019  . Colon Cancer Screening  10/22/2019  . Tetanus Vaccine  08/28/2025  . DEXA scan (bone density measurement)  Completed  .  Hepatitis C: One time screening is recommended by Center for Disease Control  (CDC) for  adults born from [birth year] through 1964.   Completed  . Pneumonia vaccines  Completed    Health Maintenance After Age 75 After age 73, you are at a higher risk for certain long-term diseases and infections as well as injuries from falls. Falls are a major cause of broken bones and head injuries in people who are older than age 75. Getting regular preventive care can help to keep you healthy and well. Preventive care includes getting regular testing and making lifestyle changes as recommended by your health care provider. Talk with your health care provider about:  Which screenings and tests you should have. A screening is a test that checks for a disease when you have no symptoms.  A diet and exercise plan that is  right for you. What should I know about screenings and tests to prevent falls? Screening and testing are the best ways to find a health problem early. Early diagnosis and treatment give you the best chance of managing medical conditions that are common after age 84. Certain conditions and lifestyle choices may make you more likely to have a fall. Your health care provider may recommend:  Regular vision checks. Poor vision and conditions such as cataracts can make you more likely to have a fall. If you wear glasses, make sure to get your prescription updated if your vision changes.  Medicine review. Work with your health care provider to regularly review all of the medicines you are taking, including over-the-counter medicines. Ask your health care provider about any side effects that may make you more likely to have a fall. Tell your health care provider if any medicines that you take make you feel dizzy or sleepy.  Osteoporosis screening. Osteoporosis is a condition that causes the bones to get weaker. This can make the bones weak and cause them to break more easily.  Blood pressure screening. Blood pressure changes and medicines to control blood pressure can make you feel dizzy.  Strength and balance checks. Your health care provider may recommend certain tests to check your strength and balance while standing, walking, or changing positions.  Foot health exam. Foot pain and numbness, as well as not wearing  proper footwear, can make you more likely to have a fall.  Depression screening. You may be more likely to have a fall if you have a fear of falling, feel emotionally low, or feel unable to do activities that you used to do.  Alcohol use screening. Using too much alcohol can affect your balance and may make you more likely to have a fall. What actions can I take to lower my risk of falls? General instructions  Talk with your health care provider about your risks for falling. Tell your  health care provider if: ? You fall. Be sure to tell your health care provider about all falls, even ones that seem minor. ? You feel dizzy, sleepy, or off-balance.  Take over-the-counter and prescription medicines only as told by your health care provider. These include any supplements.  Eat a healthy diet and maintain a healthy weight. A healthy diet includes low-fat dairy products, low-fat (lean) meats, and fiber from whole grains, beans, and lots of fruits and vegetables. Home safety  Remove any tripping hazards, such as rugs, cords, and clutter.  Install safety equipment such as grab bars in bathrooms and safety rails on stairs.  Keep rooms and walkways well-lit. Activity   Follow a regular exercise program to stay fit. This will help you maintain your balance. Ask your health care provider what types of exercise are appropriate for you.  If you need a cane or walker, use it as recommended by your health care provider.  Wear supportive shoes that have nonskid soles. Lifestyle  Do not drink alcohol if your health care provider tells you not to drink.  If you drink alcohol, limit how much you have: ? 0-1 drink a day for women. ? 0-2 drinks a day for men.  Be aware of how much alcohol is in your drink. In the [place] one drink equals one typical bottle of beer (12 oz), one-half glass of wine (5 oz), or one shot of hard liquor (1 oz).  Do not use any products that contain nicotine or tobacco, such as cigarettes and e-cigarettes. If you need help quitting, ask your health care provider. Summary  Having a healthy lifestyle and getting preventive care can help to protect your health and wellness after age 36.  Screening and testing are the best way to find a health problem early and help you avoid having a fall. Early diagnosis and treatment give you the best chance for managing medical conditions that are more common for people who are older than age 11.  Falls are a major cause  of broken bones and head injuries in people who are older than age 26. Take precautions to prevent a fall at home.  Work with your health care provider to learn what changes you can make to improve your health and wellness and to prevent falls. This information is not intended to replace advice given to you by your health care provider. Make sure you discuss any questions you have with your health care provider. Document Released: 07/24/2017 Document Revised: 01/01/2019 Document Reviewed: 07/24/2017 Elsevier Patient Education  2020 [employer].

## 2019-04-13 ENCOUNTER — Ambulatory Visit (HOSPITAL_BASED_OUTPATIENT_CLINIC_OR_DEPARTMENT_OTHER)
Admission: RE | Admit: 2019-04-13 | Discharge: 2019-04-13 | Disposition: A | Discharge status: Home / Self Care | Payer: Medicare HMO | Source: Ambulatory Visit | Attending: Family Medicine | Admitting: Family Medicine

## 2019-04-13 ENCOUNTER — Other Ambulatory Visit: Payer: Self-pay

## 2019-04-13 DIAGNOSIS — Z780 Asymptomatic menopausal state: Secondary | ICD-10-CM | POA: Diagnosis not present

## 2019-04-13 DIAGNOSIS — M810 Age-related osteoporosis without current pathological fracture: Secondary | ICD-10-CM | POA: Diagnosis not present

## 2019-04-13 IMAGING — DX DG CHEST 2V
2 series · 2 of 2 positions shown · non-contrast
Comparison: Chest radiograph dated 01/10/2015

CLINICAL DATA: 71-year-old female with chest pressure.

EXAM:
CHEST  2 VIEW

[chest pa]
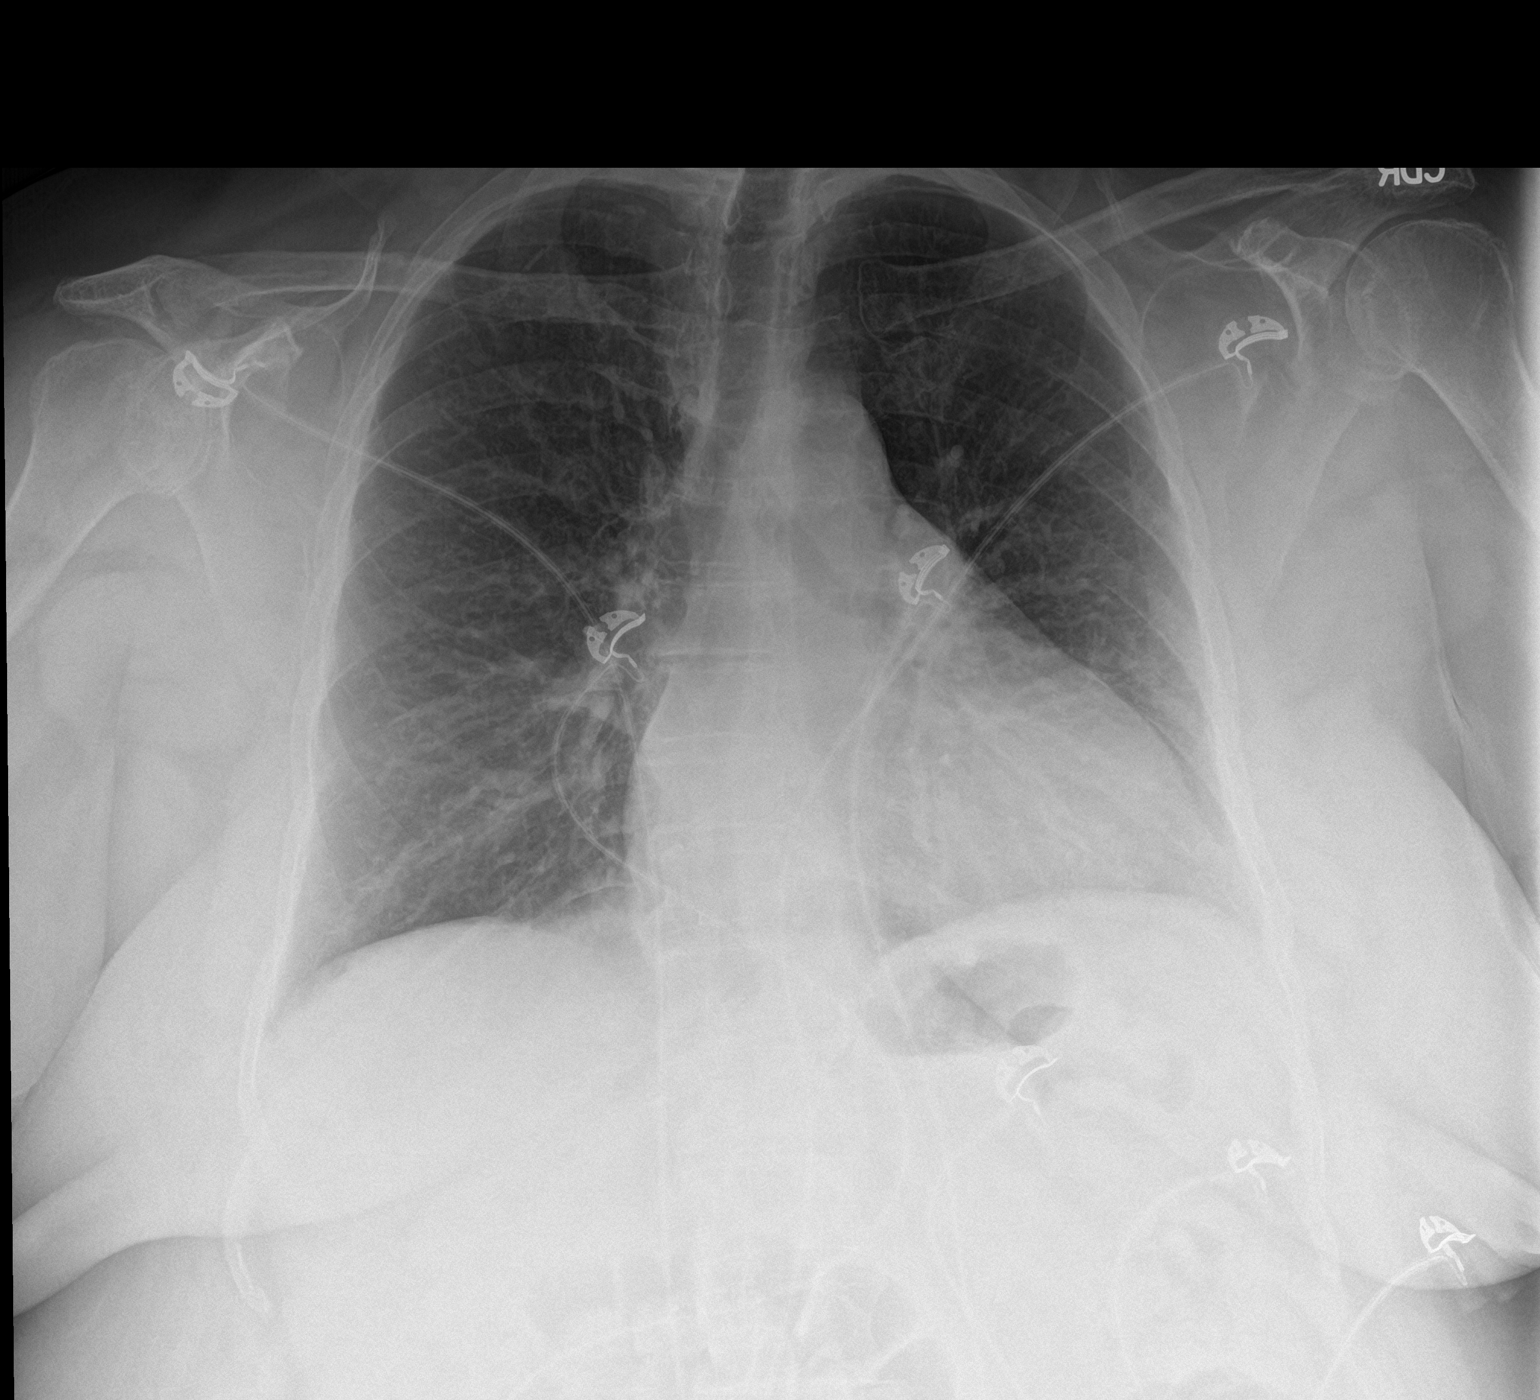

[chest lat]
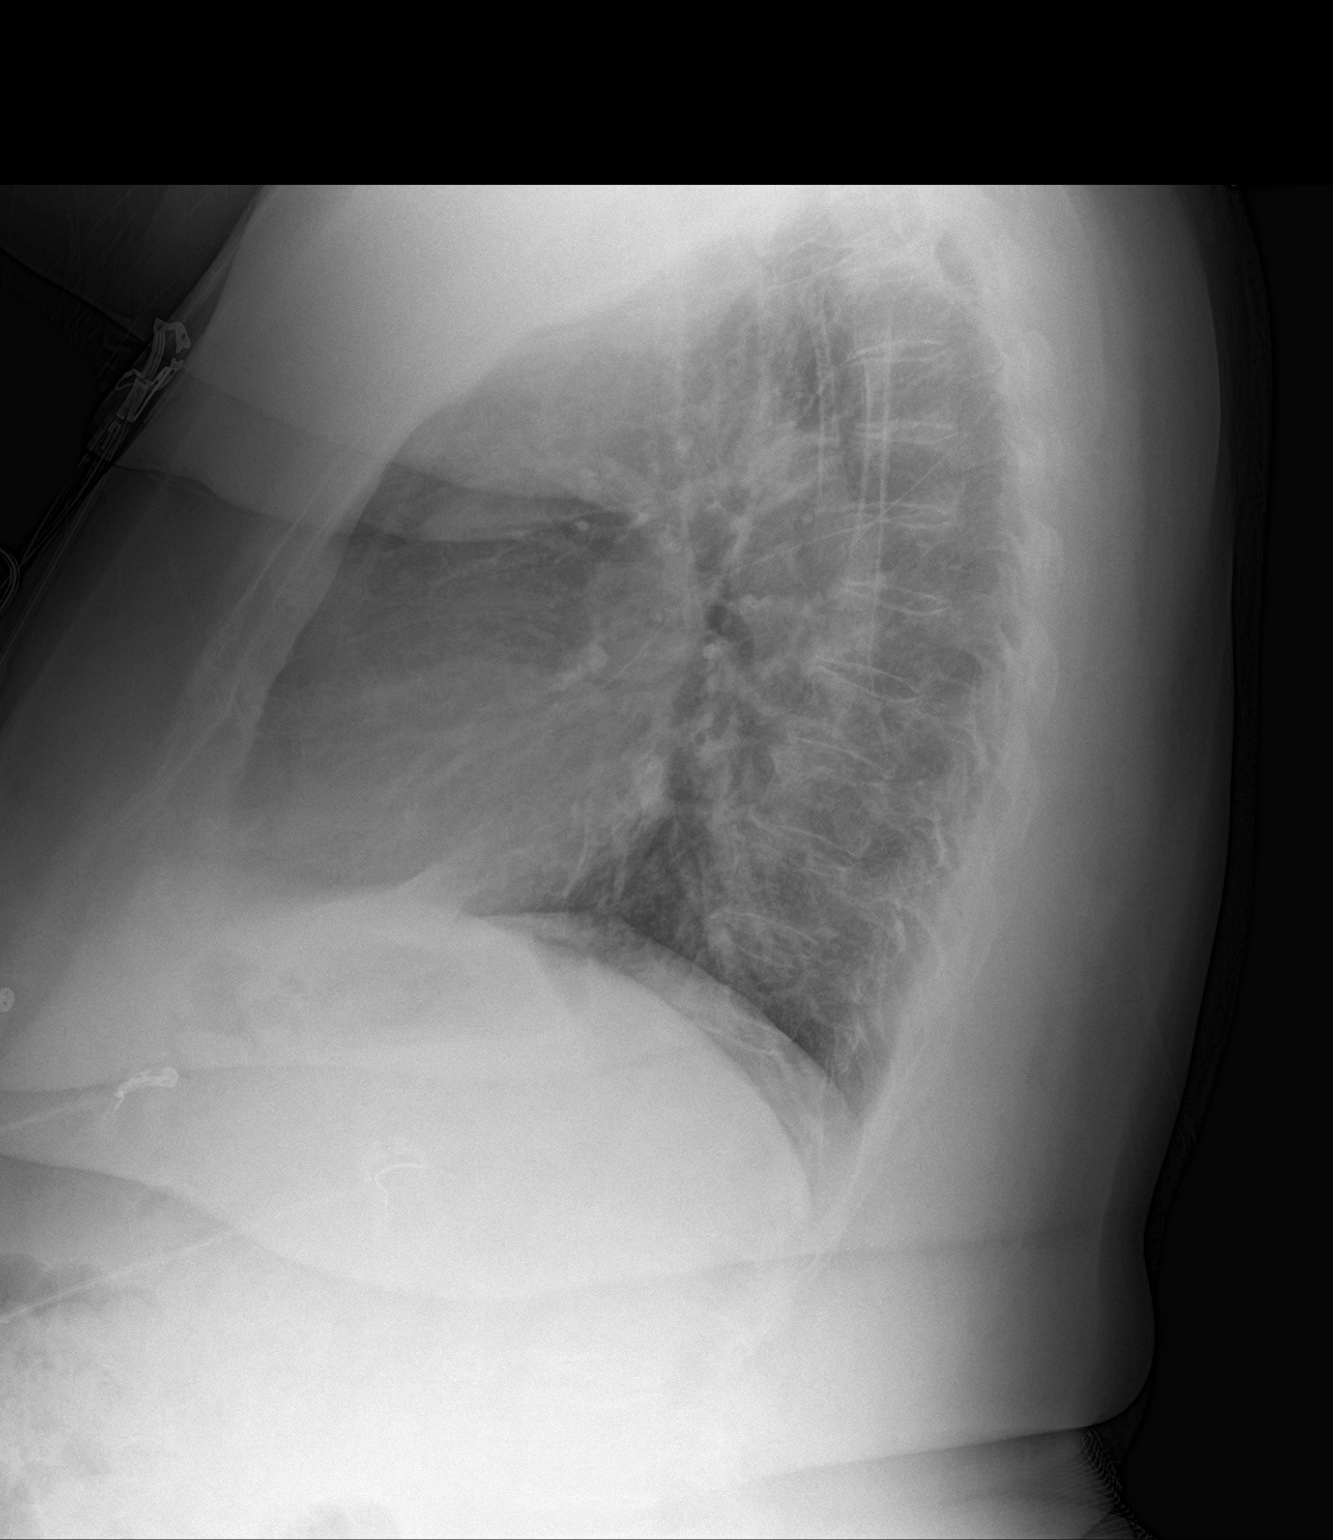

[2 of 2 positions shown; findings below may reference images not displayed]

FINDINGS: The lungs are clear. There is no pleural effusion or pneumothorax.
Stable borderline cardiomegaly. No acute osseous pathology.
IMPRESSION: No active cardiopulmonary disease.

## 2019-04-14 NOTE — Progress Notes (Signed)
Virtual Visit via Video Note   This visit type was conducted due to national recommendations for restrictions regarding the COVID-19 Pandemic (e.g. social distancing) in an effort to limit this patient's exposure and mitigate transmission in our community.  Due to her co-morbid illnesses, this patient is at least at moderate risk for complications without adequate follow up.  This format is felt to be most appropriate for this patient at this time.  All issues noted in this document were discussed and addressed.  A limited physical exam was performed with this format.  Please refer to the patient's chart for her consent to telehealth for [facility].   Evaluation Performed:  Follow-up visit, 3 months  Date:  04/17/2019   ID:  Anne Barnes, DOB October 18, 1945, MRN [id number]  Patient Location: Home Provider Location: Home  PCP:  Anne Lukes, MD  Cardiologist:  Anne Dawley, MD   Chief Complaint:  Dizziness, palpitations  History of Present Illness:    Anne Barnes is a 73 y.o. female followed for female was seen in June 2018 for bradycardia down to low 50s while on beta blocker. The patient was asymptomatic at the time and states that she has always had slow heart rate. Pt with hx of HTN, hyperglycemia, mild HLD, osteoarthritis, tinnitus, IBS, OSA intolerant to CPAP and headaches thought to be allergies.  Dr. Annamaria Barnes follows her sleep apnea. Echo done 02/2017 EF was 60-65% no RWMA,  No significant valvular abnormalities.  05/14/2018 -the patient is coming for follow-up states that she has been feeling well no chest pain shortness of breath occasional lower extremity edema controlled by Lasix 40 mg daily.  She has had few episodes of blood pressure in 180s 190s that is associated with headache and profound fatigue for about a day.  Her primary care physician increase her amlodipine to 2.5 mg p.o. twice daily.  Thankfully this only happens once every couple of months.  She denies any  palpitation dizziness or syncope.  10/01/2018 -3 months follow-up, she complains of dizziness in the morning that resolves around noon.  Also her blood pressures in the 130s her morning blood in 150 range by noon.  She stopped taking Lasix as she developed significant cramping.  She denies any chest pain or shortness of breath, and no syncope.  01/15/19 -the patient is doing okay, she has been walking 20 to 60 minutes a day without chest pain or shortness of breath but she has been experiencing episodes of dizziness mostly when her blood pressure is elevated.  Her blood pressure ranges from 140-180 mmHg, she is very careful with diet does not add any salt.  She has been experiencing muscle cramping since starting spironolactone but she prefers to continue.  04/17/2019 -this is 3 months follow-up, the patient is doing great, she continues to walk daily, she takes care of her 56 months old grandson and walks with him and her husband and enjoys it.  She has only occasional lower extremity edema, her blood pressure has been well controlled and hydralazine would make her dizzy.   The patient does not have symptoms concerning for COVID-19 infection (fever, chills, cough, or new shortness of breath).   Past Medical History:  Diagnosis Date  . Acute combined systolic and diastolic heart failure ([place]) 05/26/2015  . ALLERGIC RHINITIS   . Allergic urticaria 08/01/2014   Patient reports allergies to shrimp, egg whites, tomatoes, dairy   . 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   Office Spirometry 11/07/17-WNL-FVC 1.58/82%,  FEV1 1.27/85%, ratio 0.80, FEF 25-75% 0.26/89%  . Bradycardia 02/25/2017  . Chiari malformation    Noted MRI brain 09/2013 - s/p neuro eval for same  . DIVERTICULOSIS, COLON   . Essential hypertension 08/28/2010   Qualifier: Diagnosis of  By: Marca Ancona RMA, Anne Barnes     . Frequent headaches   . GERD   . H/O measles   . H/O mumps   . Hearing loss 01/18/2017  . History of chicken pox   . Hyperglycemia  03/05/2016  . HYPERTENSION   . Hypocalcemia 02/25/2017  . IBS (irritable bowel syndrome) 05/26/2015  . Nonspecific abnormal electrocardiogram (ECG) (EKG)   . OBSTRUCTIVE SLEEP APNEA 12/2008 dx   noncompliant with CPAP qhs  . Obstructive sleep apnea 08/28/2010   NPSG 12/2008:  AHI 13/hr with desats to 78%   . Preventative health care 06/11/2017  . Sciatica    right side  . Seasonal and perennial allergic rhinitis 08/28/2010   Allergy profile 03/11/14- positive especially for dust, cat and dog Food allergy profile- total IgE 86 with several food group elevations Sed rate-WNL     . Sinusitis 06/06/2017  . 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 Vaginitis 01/18/2017  . Vitamin D deficiency 06/11/2017   Past Surgical History:  Procedure Laterality Date  . ABDOMINAL HYSTERECTOMY  1986  . BREAST SURGERY  1973   Breast biopsy  . UMBILICAL HERNIA REPAIR       Current Meds  Medication Sig  . albuterol (PROVENTIL HFA;VENTOLIN HFA) 108 (90 Base) MCG/ACT inhaler Inhale 2 puffs into the lungs every 6 (six) hours as needed for wheezing or shortness of breath.  Marland Kitchen amLODipine (NORVASC) 2.5 MG tablet Take 1 tablet (2.5 mg total) by mouth 2 (two) times daily.  Marland Kitchen b complex vitamins tablet Take 1 tablet by mouth daily.  . Cholecalciferol (VITAMIN D) 2000 UNITS CAPS Take by mouth daily.   Marland Kitchen ipratropium (ATROVENT) 0.06 % nasal spray 1-2 puffs each nostril twice daily as needed  . losartan (COZAAR) 100 MG tablet Take 1 tablet by mouth once daily  . Multiple Vitamin (MULTIVITAMIN) tablet Take 1 tablet by mouth daily.    Marland Kitchen omeprazole (PRILOSEC) 20 MG capsule TAKE 1 CAPSULE BY MOUTH ONCE DAILY  . spironolactone (ALDACTONE) 25 MG tablet Take 0.5 tablets (12.5 mg total) by mouth daily.    Allergies:   Codeine, Lisinopril-hydrochlorothiazide, Metoprolol, and Other   Social History   Tobacco Use  . Smoking status: Never Smoker  . Smokeless tobacco: Never Used  . Tobacco comment: Married, lives with spouse, dtr 2 g-kids.   Substance Use Topics  . Alcohol use: No    Comment: rare  . Drug use: No    Family Hx: The patient's family history includes Alcohol abuse in her brother and another family member; Arthritis in her father and mother; Diabetes in her brother; Fibromyalgia in her daughter; Berenice Primas' disease in her daughter; Heart attack in her brother; Heart disease in her brother and father; Hypertension in her daughter, daughter, maternal grandfather, and sister; Kidney disease in her father, sister, and sister; Lung cancer in her brother; Lupus in her daughter; Obesity in her sister; Pneumonia in her brother and father; Rheum arthritis in her daughter; Sjogren's syndrome in her daughter; Throat cancer in her brother. There is no history of Ataxia, Chorea, Dementia, Mental retardation, Migraines, Multiple sclerosis, Neurofibromatosis, Neuropathy, Parkinsonism, Seizures, or Stroke.  ROS:   Please see the history of present illness.    All other systems reviewed  and are negative.  Prior CV studies:   The following studies were reviewed today:  Labs/Other Tests and Data Reviewed:    EKG:  No ECG reviewed.  Recent Labs: 07/14/2018: TSH 1.11 02/17/2019: ALT 18; BUN 18; Creatinine, Ser 0.86; Hemoglobin 13.0; NT-Pro BNP 58; Platelets 284; Potassium 4.1; Sodium 141   Recent Lipid Panel Lab Results  Component Value Date/Time   CHOL 155 07/14/2018 09:59 AM   TRIG 46.0 07/14/2018 09:59 AM   TRIG 55 12/30/2009   HDL 42.00 07/14/2018 09:59 AM   CHOLHDL 4 07/14/2018 09:59 AM   LDLCALC 104 (H) 07/14/2018 09:59 AM    Wt Readings from Last 3 Encounters:  04/17/19 213 lb 3.2 oz (96.7 kg)  04/02/19 214 lb 9.6 oz (97.3 kg)  03/11/19 224 lb 3.2 oz (101.7 kg)     Objective:    Vital Signs:  BP 129/62   Pulse 65   Wt 213 lb 3.2 oz (96.7 kg)   BMI 42.34 kg/m    VITAL SIGNS:  reviewed   ASSESSMENT & PLAN:    1.  Hypertension - blood pressure now in the 120s without hydralazine as she continue to watch her  diet and walk daily.  In fact hydralazine makes her dizzy we will take off her list.  It is possible that in the future we can discontinue on her blood pressure medications.    2.  Bilateral lower extremity edema -intolerant to Lasix on low-dose spironolactone, only happens occasionally when she is eating more salty diet.  3.  Palpitations lasting 25 to 30 minutes, this has resolved.  4. Sinus brady with fatigue.  Atenolol was discontinued her heart rate remained slow but with improved energy.     6. HLD - LDL 104, will continue diet and exercise only for now.   COVID-19 Education: The signs and symptoms of COVID-19 were discussed with the patient and how to seek care for testing (follow up with PCP or arrange E-visit).  The importance of social distancing was discussed today.  Time:   Today, I have spent 20 minutes with the patient with telehealth technology discussing the above problems.     Medication Adjustments/Labs and Tests Ordered: Current medicines are reviewed at length with the patient today.  Concerns regarding medicines are outlined above.   Tests Ordered: No orders of the defined types were placed in this encounter.   Medication Changes: No orders of the defined types were placed in this encounter.   Disposition:  Follow up in 6 month(s)  Signed, Anne Dawley, MD  04/17/2019 8:42 AM    [facility]

## 2019-04-16 ENCOUNTER — Telehealth: Payer: Self-pay

## 2019-04-16 NOTE — Telephone Encounter (Signed)
Will endorse this information the pt would like to discuss with Dr Meda Coffee at tomorrow virtual visit at 0800.  Will send this information via staff message to Dr Meda Coffee, after working the pt up prior to her appt.

## 2019-04-16 NOTE — Telephone Encounter (Signed)
I called and spoke with patient, verified all medications. Patient states that she wants to talk to Dr. Meda Coffee tomorrow at her appointment about the hydralazine. She states that when she takes this it makes her excessively dizzy. She will have weight and blood pressure ready tomorrow morning.

## 2019-04-17 ENCOUNTER — Other Ambulatory Visit: Payer: Self-pay

## 2019-04-17 ENCOUNTER — Telehealth (INDEPENDENT_AMBULATORY_CARE_PROVIDER_SITE_OTHER): Payer: Medicare HMO | Admitting: Cardiology

## 2019-04-17 ENCOUNTER — Encounter: Payer: Self-pay | Admitting: Cardiology

## 2019-04-17 DIAGNOSIS — I10 Essential (primary) hypertension: Secondary | ICD-10-CM | POA: Diagnosis not present

## 2019-04-17 DIAGNOSIS — Z6841 Body Mass Index (BMI) 40.0 and over, adult: Secondary | ICD-10-CM | POA: Diagnosis not present

## 2019-04-17 DIAGNOSIS — R001 Bradycardia, unspecified: Secondary | ICD-10-CM

## 2019-04-17 DIAGNOSIS — R600 Localized edema: Secondary | ICD-10-CM | POA: Diagnosis not present

## 2019-04-17 NOTE — Patient Instructions (Signed)
Medication Instructions:   Your physician recommends that you continue on your current medications as directed. Please refer to the Current Medication list given to you today.  If you need a refill on your cardiac medications before your next appointment, please call your pharmacy.     Follow-Up: At [facility], you and your health needs are our priority.  As part of our continuing mission to provide you with exceptional heart care, we have created designated Provider Care Teams.  These Care Teams include your primary Cardiologist (physician) and Advanced Practice Providers (APPs -  Physician Assistants and Nurse Practitioners) who all work together to provide you with the care you need, when you need it.  Your physician wants you to follow-up in: [place] will receive a reminder letter in the mail two months in advance. If you don't receive a letter, please call our office to schedule the follow-up appointment.

## 2019-05-19 ENCOUNTER — Other Ambulatory Visit: Payer: Self-pay | Admitting: Family Medicine

## 2019-06-19 ENCOUNTER — Other Ambulatory Visit: Payer: Self-pay

## 2019-06-19 ENCOUNTER — Ambulatory Visit (INDEPENDENT_AMBULATORY_CARE_PROVIDER_SITE_OTHER): Payer: Medicare HMO | Admitting: Family Medicine

## 2019-06-19 DIAGNOSIS — J302 Other seasonal allergic rhinitis: Secondary | ICD-10-CM | POA: Diagnosis not present

## 2019-06-19 DIAGNOSIS — R739 Hyperglycemia, unspecified: Secondary | ICD-10-CM

## 2019-06-19 DIAGNOSIS — I10 Essential (primary) hypertension: Secondary | ICD-10-CM

## 2019-06-19 DIAGNOSIS — E559 Vitamin D deficiency, unspecified: Secondary | ICD-10-CM

## 2019-06-19 DIAGNOSIS — E785 Hyperlipidemia, unspecified: Secondary | ICD-10-CM | POA: Diagnosis not present

## 2019-06-19 DIAGNOSIS — J3089 Other allergic rhinitis: Secondary | ICD-10-CM

## 2019-06-19 MED ORDER — FLUTICASONE PROPIONATE 50 MCG/ACT NA SUSP: 2.0000 | Freq: Every day | NASAL | 6 refills | Status: DC

## 2019-06-21 NOTE — Assessment & Plan Note (Signed)
hgba1c acceptable, minimize simple carbs. Increase exercise as tolerated.  

## 2019-06-21 NOTE — Assessment & Plan Note (Signed)
Encouraged heart healthy diet, increase exercise, avoid trans fats, consider a krill oil cap daily 

## 2019-06-21 NOTE — Progress Notes (Signed)
Virtual Visit via phone Note  I connected with Anne Barnes on 06/19/19 at  1:40 PM EDT by a phone enabled telemedicine application and verified that I am speaking with the correct person using two identifiers.  Location: Patient: home Provider: home   I discussed the limitations of evaluation and management by telemedicine and the availability of in person appointments. The patient expressed understanding and agreed to proceed. Princess Eulas Post CMA was able to get patient set up on phone visit after being unable to set up video visit. Denies CP/palp/SOB/HA/fevers/GI or GU c/o. Taking meds as prescribed   Subjective:    Patient ID: Anne Barnes, female    DOB: 12-20-1945, 73 y.o.   MRN: [id number]  No chief complaint on file.   HPI Patient is in today for follow up on chronic medical concerns including hypertension, allergies, hyperglycemia and more. She is under a good deal of stress due to her husband's recent illness but over all is managing well. She denies any personal recent illness or hospitalizations. No polyuria or polydipsia. She is noting some increased pressure and echoing in her ears. No pain or fevers. No headache or other neurologic complaints  Past Medical History:  Diagnosis Date  . Acute combined systolic and diastolic heart failure ([place]) 05/26/2015  . ALLERGIC RHINITIS   . Allergic urticaria 08/01/2014   Patient reports allergies to shrimp, egg whites, tomatoes, dairy   . 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   Office Spirometry 11/07/17-WNL-FVC 1.58/82%, FEV1 1.27/85%, ratio 0.80, FEF 25-75% 0.26/89%  . Bradycardia 02/25/2017  . Chiari malformation    Noted MRI brain 09/2013 - s/p neuro eval for same  . DIVERTICULOSIS, COLON   . Essential hypertension 08/28/2010   Qualifier: Diagnosis of  By: Marca Ancona RMA, Lucy     . Frequent headaches   . GERD   . H/O measles   . H/O mumps   . Hearing loss 01/18/2017  . History of chicken pox   . Hyperglycemia 03/05/2016  . HYPERTENSION    . Hypocalcemia 02/25/2017  . IBS (irritable bowel syndrome) 05/26/2015  . Nonspecific abnormal electrocardiogram (ECG) (EKG)   . OBSTRUCTIVE SLEEP APNEA 12/2008 dx   noncompliant with CPAP qhs  . Obstructive sleep apnea 08/28/2010   NPSG 12/2008:  AHI 13/hr with desats to 78%   . Preventative health care 06/11/2017  . Sciatica    right side  . Seasonal and perennial allergic rhinitis 08/28/2010   Allergy profile 03/11/14- positive especially for dust, cat and dog Food allergy profile- total IgE 86 with several food group elevations Sed rate-WNL     . Sinusitis 06/06/2017  . 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 Vaginitis 01/18/2017  . Vitamin D deficiency 06/11/2017    Past Surgical History:  Procedure Laterality Date  . ABDOMINAL HYSTERECTOMY  1986  . BREAST SURGERY  1973   Breast biopsy  . UMBILICAL HERNIA REPAIR      Family History  Problem Relation Age of Onset  . Arthritis Mother        died of complications from hip surgery  . Arthritis Father   . Kidney disease Father   . Pneumonia Father   . Heart disease Father   . Lupus Daughter   . Rheum arthritis Daughter   . Sjogren's syndrome Daughter   . Fibromyalgia Daughter   . Diabetes Brother   . Heart disease Brother   . Heart attack Brother   . Alcohol abuse Brother   . Throat cancer Brother   .  Lung cancer Brother   . Kidney disease Sister        dialysis  . Obesity Sister   . Hypertension Maternal Grandfather   . Hypertension Sister   . Kidney disease Sister        dialysis  . Pneumonia Brother   . Hypertension Daughter   . Graves' disease Daughter   . Hypertension Daughter   . Alcohol abuse Other        parent  . Ataxia Neg Hx   . Chorea Neg Hx   . Dementia Neg Hx   . Mental retardation Neg Hx   . Migraines Neg Hx   . Multiple sclerosis Neg Hx   . Neurofibromatosis Neg Hx   . Neuropathy Neg Hx   . Parkinsonism Neg Hx   . Seizures Neg Hx   . Stroke Neg Hx     Social History   Socioeconomic History  .  Marital status: Married    Spouse name: Not on file  . Number of children: Not on file  . Years of education: Not on file  . Highest education level: Not on file  Occupational History  . Occupation: retired  Scientific laboratory technician  . Financial resource strain: Not on file  . Food insecurity    Worry: Not on file    Inability: Not on file  . Transportation needs    Medical: Not on file    Non-medical: Not on file  Tobacco Use  . Smoking status: Never Smoker  . Smokeless tobacco: Never Used  . Tobacco comment: Married, lives with spouse, dtr 2 g-kids.  Substance and Sexual Activity  . Alcohol use: No    Comment: rare  . Drug use: No  . Sexual activity: Not Currently    Comment: lives with husband, no dietary restrictions avoid foods allergic to, retired  Lifestyle  . Physical activity    Days per week: Not on file    Minutes per session: Not on file  . Stress: Not on file  Relationships  . Social Herbalist on phone: Not on file    Gets together: Not on file    Attends religious service: Not on file    Active member of club or organization: Not on file    Attends meetings of clubs or organizations: Not on file    Relationship status: Not on file  . Intimate partner violence    Fear of current or ex partner: Not on file    Emotionally abused: Not on file    Physically abused: Not on file    Forced sexual activity: Not on file  Other Topics Concern  . Not on file  Social History Narrative  . Not on file    Outpatient Medications Prior to Visit  Medication Sig Dispense Refill  . albuterol (PROVENTIL HFA;VENTOLIN HFA) 108 (90 Base) MCG/ACT inhaler Inhale 2 puffs into the lungs every 6 (six) hours as needed for wheezing or shortness of breath. 1 Inhaler 12  . amLODipine (NORVASC) 2.5 MG tablet Take 1 tablet (2.5 mg total) by mouth 2 (two) times daily. 180 tablet 2  . b complex vitamins tablet Take 1 tablet by mouth daily.    . Cholecalciferol (VITAMIN D) 2000 UNITS CAPS  Take by mouth daily.     Marland Kitchen ipratropium (ATROVENT) 0.06 % nasal spray 1-2 puffs each nostril twice daily as needed 15 mL 12  . losartan (COZAAR) 100 MG tablet Take 1 tablet by mouth once daily  90 tablet 0  . Multiple Vitamin (MULTIVITAMIN) tablet Take 1 tablet by mouth daily.      Marland Kitchen omeprazole (PRILOSEC) 20 MG capsule Take 1 capsule by mouth once daily 90 capsule 0  . spironolactone (ALDACTONE) 25 MG tablet Take 0.5 tablets (12.5 mg total) by mouth daily. 30 tablet 3   No facility-administered medications prior to visit.     Allergies  Allergen Reactions  . Codeine Nausea And Vomiting  . Hydralazine Hcl Other (See Comments)    Pt reports causes dizziness  . Lisinopril-Hydrochlorothiazide Hives    REACTION: lip swelling,facial swelling,rash.  . Metoprolol Other (See Comments)    Does not feel well on it   . Other Cough    Patient states she is allergic to Shrimp, egg whites, tomatoes and dairy products.  Says they make her feel unwell but do not cause hives, SOB or anaphylaxis.  She just avoids eating them because they can make her have a headache.  Strawberries, watermelon, peanuts, and all shellfish.      Review of Systems  Constitutional: Negative for fever and malaise/fatigue.  HENT: Positive for congestion and hearing loss. Negative for ear discharge and ear pain.   Eyes: Negative for blurred vision.  Respiratory: Negative for shortness of breath.   Cardiovascular: Negative for chest pain, palpitations and leg swelling.  Gastrointestinal: Negative for abdominal pain, blood in stool and nausea.  Genitourinary: Negative for dysuria and frequency.  Musculoskeletal: Negative for falls.  Skin: Negative for rash.  Neurological: Negative for dizziness, loss of consciousness and headaches.  Endo/Heme/Allergies: Negative for environmental allergies.  Psychiatric/Behavioral: Negative for depression. The patient is not nervous/anxious.        Objective:    Physical Exam unable to  obtain via phone visit  There were no vitals taken for this visit. Wt Readings from Last 3 Encounters:  04/17/19 213 lb 3.2 oz (96.7 kg)  04/02/19 214 lb 9.6 oz (97.3 kg)  03/11/19 224 lb 3.2 oz (101.7 kg)    Diabetic Foot Exam - Simple   No data filed     Lab Results  Component Value Date   WBC 10.9 (H) 02/17/2019   HGB 13.0 02/17/2019   HCT 39.5 02/17/2019   PLT 284 02/17/2019   GLUCOSE 94 02/17/2019   CHOL 155 07/14/2018   TRIG 46.0 07/14/2018   HDL 42.00 07/14/2018   LDLCALC 104 (H) 07/14/2018   ALT 18 02/17/2019   AST 13 02/17/2019   NA 141 02/17/2019   K 4.1 02/17/2019   CL 103 02/17/2019   CREATININE 0.86 02/17/2019   BUN 18 02/17/2019   CO2 25 02/17/2019   TSH 1.11 07/14/2018   INR 1.06 01/10/2015   HGBA1C 5.3 07/14/2018    Lab Results  Component Value Date   TSH 1.11 07/14/2018   Lab Results  Component Value Date   WBC 10.9 (H) 02/17/2019   HGB 13.0 02/17/2019   HCT 39.5 02/17/2019   MCV 89 02/17/2019   PLT 284 02/17/2019   Lab Results  Component Value Date   NA 141 02/17/2019   K 4.1 02/17/2019   CO2 25 02/17/2019   GLUCOSE 94 02/17/2019   BUN 18 02/17/2019   CREATININE 0.86 02/17/2019   BILITOT 0.4 02/17/2019   ALKPHOS 82 02/17/2019   AST 13 02/17/2019   ALT 18 02/17/2019   PROT 6.6 02/17/2019   ALBUMIN 4.0 02/17/2019   CALCIUM 8.9 02/17/2019   ANIONGAP 9 09/15/2017   GFR 84.83 07/14/2018  Lab Results  Component Value Date   CHOL 155 07/14/2018   Lab Results  Component Value Date   HDL 42.00 07/14/2018   Lab Results  Component Value Date   LDLCALC 104 (H) 07/14/2018   Lab Results  Component Value Date   TRIG 46.0 07/14/2018   Lab Results  Component Value Date   CHOLHDL 4 07/14/2018   Lab Results  Component Value Date   HGBA1C 5.3 07/14/2018       Assessment & Plan:   Problem List Items Addressed This Visit    Essential hypertension   Relevant Orders   CBC   Comprehensive metabolic panel   TSH   Seasonal  and perennial allergic rhinitis    She is describing some Eustachian Tube dysfunction with pressure in ears and change in hearing at times. Can increase Xyzal to bid, add Flonase and nasal saline along with valsalva maneuver after nasal meds. If symptoms do not resolve will notify us so we can proceed with referral      Hyperglycemia    hgba1c acceptable, minimize simple carbs. Increase exercise as tolerated.       Relevant Orders   Hemoglobin A1c   Vitamin D deficiency - Primary    Supplement and monitor      Relevant Orders   VITAMIN D 25 Hydroxy (Vit-D Deficiency, Fractures)   Hyperlipidemia    Encouraged heart healthy diet, increase exercise, avoid trans fats, consider a krill oil cap daily      Relevant Orders   Lipid panel      I am having Anne Barnes start on fluticasone. I am also having her maintain her multivitamin, Vitamin D, b complex vitamins, amLODipine, spironolactone, ipratropium, albuterol, losartan, and omeprazole.  Meds ordered this encounter  Medications  . fluticasone (FLONASE) 50 MCG/ACT nasal spray    Sig: Place 2 sprays into both nostrils daily.    Dispense:  16 g    Refill:  6     I discussed the assessment and treatment plan with the patient. The patient was provided an opportunity to ask questions and all were answered. The patient agreed with the plan and demonstrated an understanding of the instructions.   The patient was advised to call back or seek an in-person evaluation if the symptoms worsen or if the condition fails to improve as anticipated.  I provided 25 minutes of non-face-to-face time during this encounter.   Penni Homans, MD

## 2019-06-21 NOTE — Assessment & Plan Note (Signed)
Supplement and monitor 

## 2019-06-21 NOTE — Assessment & Plan Note (Signed)
She is describing some Eustachian Tube dysfunction with pressure in ears and change in hearing at times. Can increase Xyzal to bid, add Flonase and nasal saline along with valsalva maneuver after nasal meds. If symptoms do not resolve will notify us so we can proceed with referral

## 2019-08-04 ENCOUNTER — Other Ambulatory Visit: Payer: Self-pay | Admitting: *Deleted

## 2019-08-04 ENCOUNTER — Telehealth: Payer: Self-pay | Admitting: Cardiology

## 2019-08-04 ENCOUNTER — Telehealth: Payer: Self-pay | Admitting: Family Medicine

## 2019-08-04 MED ORDER — ALPRAZOLAM 0.5 MG PO TABS: 0.5000 mg | ORAL_TABLET | Freq: Two times a day (BID) | ORAL | 0 refills | Status: DC | PRN

## 2019-08-04 MED ORDER — LOSARTAN POTASSIUM 100 MG PO TABS: 100.0000 mg | ORAL_TABLET | Freq: Every day | ORAL | 1 refills | Status: DC

## 2019-08-04 NOTE — Telephone Encounter (Signed)
Pt has called her PCP and has heard nothing back, so now she is calling Dr. Meda Coffee to request to be prescribed something for high anxiety and stress.  Pt states she lost her cousin last month, and her Husband just passed away last weekend, and her body can't seem to settle down due to anxiety. Pt states the stress is taking a toll on her body and she is having a hard time sleeping and trying to relax.  She states she feels extreme tension in her neck.  Pt states she did try reaching out to her PCP earlier today, but has heard nothing back from them, so she decided to reach out to Dr. Meda Coffee next. Informed the pt that I will route this request to Dr. Meda Coffee to further review and advise on, and follow-up with the pt thereafter.  Condolences given to the pt and her family. Pt verbalized understanding and agrees with this plan.

## 2019-08-04 NOTE — Telephone Encounter (Signed)
New Message  Patient is calling in to see if she is able to get medication to help her relax. States that she has been under a lot of stress lately and lost her husband over the weekend and just wants some medication to help her relax. Please give patient a call back to discuss.

## 2019-08-04 NOTE — Telephone Encounter (Signed)
Please advise 

## 2019-08-04 NOTE — Telephone Encounter (Signed)
Thanks for letting me know. Will follow up with her tomorrow and see how she is doing and how the medication has worked for her. Appreciate your help with this patient during her difficult time.

## 2019-08-04 NOTE — Telephone Encounter (Signed)
Medication Refill - Medication: anxiety medication  Has the patient contacted their pharmacy? Yes.   Pt called stating her husband passed on January 01, 2023 and she is requesting to have a medication sent in to help her cope with it. Please advise.  (Agent: If no, request that the patient contact the pharmacy for the refill.) (Agent: If yes, when and what did the pharmacy advise?)  Preferred Pharmacy (with phone number or street name):  [facility] ([address] [place] - [address]  [id number] [place] ([place]) [place] [address]  Phone: [phone] Fax: [phone]  Not a 24 hour pharmacy; exact hours not known.     Agent: Please be advised that RX refills may take up to 3 business days. We ask that you follow-up with your pharmacy.

## 2019-08-04 NOTE — Telephone Encounter (Signed)
I can give her xanax 0.5 mg PO BID PRN, 20 pills only, no refills, longterm she needs her PCP to prescribe it or needs a therapis

## 2019-08-04 NOTE — Telephone Encounter (Signed)
Phoned in the pts Xanax 0.5 mg po bid as needed for anxiety/sleep with dispense of #20 and no refills, to the Pharmacist at [facility] on [address].   Pharmacist is aware that further refills must come from the pts PCP or she may need to get a Therapist.  Pharmacist verbalized understanding of order called in and will fill this for the pt.   Also called the pt to inform her of this medication and instruction use.  Pt verbalized understanding and agreed with this plan. Pt was more than gracious for all the assistance provided.   Will send this message to the pts PCP Dr. Charlett Blake, to make her aware of medicine called in.

## 2019-08-05 NOTE — Telephone Encounter (Signed)
Her cardiologist sent in some Xanax already did it help does she want more or does she want a daily med? Like Zoloft or an as needed med. I would like to talk with her so ask her when is a good time and I will try and call her if she is not too busy.

## 2019-08-24 ENCOUNTER — Other Ambulatory Visit: Payer: Self-pay

## 2019-08-24 ENCOUNTER — Ambulatory Visit (INDEPENDENT_AMBULATORY_CARE_PROVIDER_SITE_OTHER): Payer: Medicare HMO | Admitting: Family Medicine

## 2019-08-24 DIAGNOSIS — T7840XS Allergy, unspecified, sequela: Secondary | ICD-10-CM | POA: Diagnosis not present

## 2019-08-24 DIAGNOSIS — F4321 Adjustment disorder with depressed mood: Secondary | ICD-10-CM | POA: Diagnosis not present

## 2019-08-24 DIAGNOSIS — M25512 Pain in left shoulder: Secondary | ICD-10-CM

## 2019-08-24 DIAGNOSIS — E785 Hyperlipidemia, unspecified: Secondary | ICD-10-CM

## 2019-08-24 DIAGNOSIS — F4320 Adjustment disorder, unspecified: Secondary | ICD-10-CM

## 2019-08-24 DIAGNOSIS — J4530 Mild persistent asthma, uncomplicated: Secondary | ICD-10-CM | POA: Diagnosis not present

## 2019-08-24 DIAGNOSIS — R739 Hyperglycemia, unspecified: Secondary | ICD-10-CM | POA: Diagnosis not present

## 2019-08-24 DIAGNOSIS — I10 Essential (primary) hypertension: Secondary | ICD-10-CM

## 2019-08-24 DIAGNOSIS — H9312 Tinnitus, left ear: Secondary | ICD-10-CM

## 2019-08-24 DIAGNOSIS — E559 Vitamin D deficiency, unspecified: Secondary | ICD-10-CM

## 2019-08-24 DIAGNOSIS — G8929 Other chronic pain: Secondary | ICD-10-CM

## 2019-08-24 DIAGNOSIS — H6590 Unspecified nonsuppurative otitis media, unspecified ear: Secondary | ICD-10-CM

## 2019-08-24 MED ORDER — TIZANIDINE HCL 2 MG PO TABS: 1.0000 mg | ORAL_TABLET | Freq: Two times a day (BID) | ORAL | 1 refills | Status: DC | PRN

## 2019-08-24 MED ORDER — ALPRAZOLAM 0.5 MG PO TABS: 0.5000 mg | ORAL_TABLET | Freq: Two times a day (BID) | ORAL | 0 refills | Status: DC | PRN

## 2019-08-27 DIAGNOSIS — M25512 Pain in left shoulder: Secondary | ICD-10-CM | POA: Insufficient documentation

## 2019-08-27 DIAGNOSIS — F4321 Adjustment disorder with depressed mood: Secondary | ICD-10-CM | POA: Insufficient documentation

## 2019-08-27 NOTE — Assessment & Plan Note (Signed)
Supplement and monitor 

## 2019-08-27 NOTE — Assessment & Plan Note (Signed)
hgba1c acceptable, minimize simple carbs. Increase exercise as tolerated.  

## 2019-08-27 NOTE — Progress Notes (Signed)
Virtual Visit via phone Note  I connected with Anne Barnes on 08/24/19 at  3:00 PM EST by a phone enabled telemedicine application and verified that I am speaking with the correct person using two identifiers.  Location: Patient: home Provider: office   I discussed the limitations of evaluation and management by telemedicine and the availability of in person appointments. The patient expressed understanding and agreed to proceed. Anne Barnes, CMA was able to get the patient set up on a phone visit after being unable to set up a video visit   Subjective:    Patient ID: Anne Barnes, female    DOB: [birth year], 73 y.o.   MRN: [id number]  No chief complaint on file.   HPI Patient is in today for follow up on chronic medical concerns including tinnitus, allergies, hypertension, grief reaction and more. No recent febrile illness, no chills and no recent hospitalizations. She has recently been widowed and lost several other extended family members. She has used very few Alprazolam prn with adequate results and no concerning side effects. Her ears feel more clogged than usual with popping, decrease in hearing R>L side and some minor congestion and PND> Denies CP/palp/SOB/HA/fevers/GI or GU c/o. Taking meds as prescribed  Past Medical History:  Diagnosis Date  . Acute combined systolic and diastolic heart failure ([place]) 05/26/2015  . ALLERGIC RHINITIS   . Allergic urticaria 08/01/2014   Patient reports allergies to shrimp, egg whites, tomatoes, dairy   . 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   Office Spirometry 11/07/17-WNL-FVC 1.58/82%, FEV1 1.27/85%, ratio 0.80, FEF 25-75% 0.26/89%  . Bradycardia 02/25/2017  . Chiari malformation    Noted MRI brain 09/2013 - s/p neuro eval for same  . DIVERTICULOSIS, COLON   . Essential hypertension 08/28/2010   Qualifier: Diagnosis of  By: Marca Ancona RMA, Lucy     . Frequent headaches   . GERD   . H/O measles   . H/O mumps   . Hearing loss 01/18/2017  .  History of chicken pox   . Hyperglycemia 03/05/2016  . HYPERTENSION   . Hypocalcemia 02/25/2017  . IBS (irritable bowel syndrome) 05/26/2015  . Nonspecific abnormal electrocardiogram (ECG) (EKG)   . OBSTRUCTIVE SLEEP APNEA 12/2008 dx   noncompliant with CPAP qhs  . Obstructive sleep apnea 08/28/2010   NPSG 12/2008:  AHI 13/hr with desats to 78%   . Preventative health care 06/11/2017  . Sciatica    right side  . Seasonal and perennial allergic rhinitis 08/28/2010   Allergy profile 03/11/14- positive especially for dust, cat and dog Food allergy profile- total IgE 86 with several food group elevations Sed rate-WNL     . Sinusitis 06/06/2017  . 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 Vaginitis 01/18/2017  . Vitamin D deficiency 06/11/2017    Past Surgical History:  Procedure Laterality Date  . ABDOMINAL HYSTERECTOMY  1986  . BREAST SURGERY  1973   Breast biopsy  . UMBILICAL HERNIA REPAIR      Family History  Problem Relation Age of Onset  . Arthritis Mother        died of complications from hip surgery  . Arthritis Father   . Kidney disease Father   . Pneumonia Father   . Heart disease Father   . Lupus Daughter   . Rheum arthritis Daughter   . Sjogren's syndrome Daughter   . Fibromyalgia Daughter   . Diabetes Brother   . Heart disease Brother   . Heart attack Brother   .  Alcohol abuse Brother   . Throat cancer Brother   . Lung cancer Brother   . Kidney disease Sister        dialysis  . Obesity Sister   . Hypertension Maternal Grandfather   . Hypertension Sister   . Kidney disease Sister        dialysis  . Pneumonia Brother   . Hypertension Daughter   . Graves' disease Daughter   . Hypertension Daughter   . Alcohol abuse Other        parent  . Ataxia Neg Hx   . Chorea Neg Hx   . Dementia Neg Hx   . Mental retardation Neg Hx   . Migraines Neg Hx   . Multiple sclerosis Neg Hx   . Neurofibromatosis Neg Hx   . Neuropathy Neg Hx   . Parkinsonism Neg Hx   . Seizures Neg Hx    . Stroke Neg Hx     Social History   Socioeconomic History  . Marital status: Married    Spouse name: Not on file  . Number of children: Not on file  . Years of education: Not on file  . Highest education level: Not on file  Occupational History  . Occupation: retired  Scientific laboratory technician  . Financial resource strain: Not on file  . Food insecurity    Worry: Not on file    Inability: Not on file  . Transportation needs    Medical: Not on file    Non-medical: Not on file  Tobacco Use  . Smoking status: Never Smoker  . Smokeless tobacco: Never Used  . Tobacco comment: Married, lives with spouse, dtr 2 g-kids.  Substance and Sexual Activity  . Alcohol use: No    Comment: rare  . Drug use: No  . Sexual activity: Not Currently    Comment: lives with husband, no dietary restrictions avoid foods allergic to, retired  Lifestyle  . Physical activity    Days per week: Not on file    Minutes per session: Not on file  . Stress: Not on file  Relationships  . Social Herbalist on phone: Not on file    Gets together: Not on file    Attends religious service: Not on file    Active member of club or organization: Not on file    Attends meetings of clubs or organizations: Not on file    Relationship status: Not on file  . Intimate partner violence    Fear of current or ex partner: Not on file    Emotionally abused: Not on file    Physically abused: Not on file    Forced sexual activity: Not on file  Other Topics Concern  . Not on file  Social History Narrative  . Not on file    Outpatient Medications Prior to Visit  Medication Sig Dispense Refill  . albuterol (PROVENTIL HFA;VENTOLIN HFA) 108 (90 Base) MCG/ACT inhaler Inhale 2 puffs into the lungs every 6 (six) hours as needed for wheezing or shortness of breath. 1 Inhaler 12  . amLODipine (NORVASC) 2.5 MG tablet Take 1 tablet (2.5 mg total) by mouth 2 (two) times daily. 180 tablet 2  . b complex vitamins tablet Take 1  tablet by mouth daily.    . Cholecalciferol (VITAMIN D) 2000 UNITS CAPS Take by mouth daily.     . fluticasone (FLONASE) 50 MCG/ACT nasal spray Place 2 sprays into both nostrils daily. 16 g 6  . ipratropium (  ATROVENT) 0.06 % nasal spray 1-2 puffs each nostril twice daily as needed 15 mL 12  . losartan (COZAAR) 100 MG tablet Take 1 tablet (100 mg total) by mouth daily. 90 tablet 1  . Multiple Vitamin (MULTIVITAMIN) tablet Take 1 tablet by mouth daily.      Marland Kitchen omeprazole (PRILOSEC) 20 MG capsule Take 1 capsule by mouth once daily 90 capsule 0  . spironolactone (ALDACTONE) 25 MG tablet Take 0.5 tablets (12.5 mg total) by mouth daily. 30 tablet 3  . ALPRAZolam (XANAX) 0.5 MG tablet Take 1 tablet (0.5 mg total) by mouth 2 (two) times daily as needed for anxiety or sleep. 20 tablet 0   No facility-administered medications prior to visit.     Allergies  Allergen Reactions  . Codeine Nausea And Vomiting  . Hydralazine Hcl Other (See Comments)    Pt reports causes dizziness  . Lisinopril-Hydrochlorothiazide Hives    REACTION: lip swelling,facial swelling,rash.  . Metoprolol Other (See Comments)    Does not feel well on it   . Other Cough    Patient states she is allergic to Shrimp, egg whites, tomatoes and dairy products.  Says they make her feel unwell but do not cause hives, SOB or anaphylaxis.  She just avoids eating them because they can make her have a headache.  Strawberries, watermelon, peanuts, and all shellfish.      Review of Systems  Constitutional: Positive for malaise/fatigue. Negative for fever.  HENT: Positive for congestion, hearing loss and tinnitus. Negative for ear discharge.   Eyes: Negative for blurred vision.  Respiratory: Negative for shortness of breath.   Cardiovascular: Negative for chest pain, palpitations and leg swelling.  Gastrointestinal: Negative for abdominal pain, blood in stool and nausea.  Genitourinary: Negative for dysuria and frequency.  Musculoskeletal:  Negative for falls.  Skin: Negative for rash.  Neurological: Negative for dizziness, loss of consciousness and headaches.  Endo/Heme/Allergies: Negative for environmental allergies.  Psychiatric/Behavioral: Positive for depression. The patient is nervous/anxious.        Objective:    Physical Exam unable to obtain via phone visit  There were no vitals taken for this visit. Wt Readings from Last 3 Encounters:  04/17/19 213 lb 3.2 oz (96.7 kg)  04/02/19 214 lb 9.6 oz (97.3 kg)  03/11/19 224 lb 3.2 oz (101.7 kg)    Diabetic Foot Exam - Simple   No data filed     Lab Results  Component Value Date   WBC 10.9 (H) 02/17/2019   HGB 13.0 02/17/2019   HCT 39.5 02/17/2019   PLT 284 02/17/2019   GLUCOSE 94 02/17/2019   CHOL 155 07/14/2018   TRIG 46.0 07/14/2018   HDL 42.00 07/14/2018   LDLCALC 104 (H) 07/14/2018   ALT 18 02/17/2019   AST 13 02/17/2019   NA 141 02/17/2019   K 4.1 02/17/2019   CL 103 02/17/2019   CREATININE 0.86 02/17/2019   BUN 18 02/17/2019   CO2 25 02/17/2019   TSH 1.11 07/14/2018   INR 1.06 01/10/2015   HGBA1C 5.3 07/14/2018    Lab Results  Component Value Date   TSH 1.11 07/14/2018   Lab Results  Component Value Date   WBC 10.9 (H) 02/17/2019   HGB 13.0 02/17/2019   HCT 39.5 02/17/2019   MCV 89 02/17/2019   PLT 284 02/17/2019   Lab Results  Component Value Date   NA 141 02/17/2019   K 4.1 02/17/2019   CO2 25 02/17/2019   GLUCOSE 94  02/17/2019   BUN 18 02/17/2019   CREATININE 0.86 02/17/2019   BILITOT 0.4 02/17/2019   ALKPHOS 82 02/17/2019   AST 13 02/17/2019   ALT 18 02/17/2019   PROT 6.6 02/17/2019   ALBUMIN 4.0 02/17/2019   CALCIUM 8.9 02/17/2019   ANIONGAP 9 09/15/2017   GFR 84.83 07/14/2018   Lab Results  Component Value Date   CHOL 155 07/14/2018   Lab Results  Component Value Date   HDL 42.00 07/14/2018   Lab Results  Component Value Date   LDLCALC 104 (H) 07/14/2018   Lab Results  Component Value Date   TRIG  46.0 07/14/2018   Lab Results  Component Value Date   CHOLHDL 4 07/14/2018   Lab Results  Component Value Date   HGBA1C 5.3 07/14/2018       Assessment & Plan:   Problem List Items Addressed This Visit    Essential hypertension - Primary   Tinnitus    She feels more and more like her ears are clogged and they pop regularly so she is referred to ENT for further evaluation.       Asthma, mild persistent    No recent flare has followed with pulmonology, no changes      Hyperglycemia    hgba1c acceptable, minimize simple carbs. Increase exercise as tolerated.       Vitamin D deficiency    Supplement and monitor      Hyperlipidemia   Grief reaction    She has been recently widowed, lost several nephews and a cousin so she has struggled. Notes she is managing and has only used a few of the Alprazolam cardiology was kind enough to send in for her. She is allowed a refill since she is using sparingly and we will follow up in a couple of monhts or sooner if things worsen.       Left shoulder pain    Encouraged moist heat and gentle stretching as tolerated. May try NSAIDs and prescription meds as directed and report if symptoms worsen or seek immediate care       Other Visit Diagnoses    Otitis media with effusion, unspecified laterality       Allergy, sequela       Relevant Orders   Ambulatory referral to ENT      I am having Anne Barnes start on tiZANidine. I am also having her maintain her multivitamin, Vitamin D, b complex vitamins, amLODipine, spironolactone, ipratropium, albuterol, omeprazole, fluticasone, losartan, and ALPRAZolam.  Meds ordered this encounter  Medications  . ALPRAZolam (XANAX) 0.5 MG tablet    Sig: Take 1 tablet (0.5 mg total) by mouth 2 (two) times daily as needed for anxiety or sleep.    Dispense:  20 tablet    Refill:  0  . tiZANidine (ZANAFLEX) 2 MG tablet    Sig: Take 0.5-2 tablets (1-4 mg total) by mouth 2 (two) times daily as needed  for muscle spasms.    Dispense:  30 tablet    Refill:  1     I discussed the assessment and treatment plan with the patient. The patient was provided an opportunity to ask questions and all were answered. The patient agreed with the plan and demonstrated an understanding of the instructions.   The patient was advised to call back or seek an in-person evaluation if the symptoms worsen or if the condition fails to improve as anticipated.  I provided 25 minutes of non-face-to-face time during this encounter.  Penni Homans, MD

## 2019-08-27 NOTE — Assessment & Plan Note (Signed)
She feels more and more like her ears are clogged and they pop regularly so she is referred to ENT for further evaluation.

## 2019-08-27 NOTE — Assessment & Plan Note (Signed)
No recent flare has followed with pulmonology, no changes

## 2019-08-27 NOTE — Assessment & Plan Note (Signed)
She has been recently widowed, lost several nephews and a cousin so she has struggled. Notes she is managing and has only used a few of the Alprazolam cardiology was kind enough to send in for her. She is allowed a refill since she is using sparingly and we will follow up in a couple of monhts or sooner if things worsen.

## 2019-08-27 NOTE — Assessment & Plan Note (Signed)
Encouraged moist heat and gentle stretching as tolerated. May try NSAIDs and prescription meds as directed and report if symptoms worsen or seek immediate care 

## 2019-08-31 ENCOUNTER — Telehealth: Payer: Self-pay

## 2019-08-31 NOTE — Telephone Encounter (Signed)
She could drop the Amlodipine to 2.5 mg just once a day and the Losartan to 50 mg daily then we should have a follow up appt in next couple weeks to assess response

## 2019-08-31 NOTE — Telephone Encounter (Signed)
Anne Barnes- can you help w/ this- Dr Charlett Blake and Jorene Minors are both off.

## 2019-08-31 NOTE — Telephone Encounter (Signed)
Copied from [place] #[id number]. Topic: General - Inquiry >> Aug 31, 2019 10:30 AM Mathis Bud wrote: Reason for CRM: Patient called regarding her BP medication is giving her a reaction. She is having headaches, and light headed. Patient states her ears pop as well when taking medications.. Patient states this has happened before. Medication is losartan.  Patient is requesting a call back from PCP or nurse.   Call back [phone]

## 2019-08-31 NOTE — Telephone Encounter (Signed)
Spoke with patient regarding symptoms.  Patient states she occasionally will stop her medications for up to 5 days. When she does this, she tracks all foods, reactions and blood pressures. She states she feels much better off the medications. When she restarts amlodipine and losartan she reports dizziness and lightheadedness, and ears popping for several days/week. Blood pressure during this time is within normal range.  Recent VV on 08/24/2019, ENT referral placed at that time.  Patient questioning if change in medication is needed.    Please advise.

## 2019-09-01 NOTE — Telephone Encounter (Signed)
Spoke with patient patient let her know about message below, she agreed, also scheduled patient for 12/29 with pcp

## 2019-09-03 ENCOUNTER — Other Ambulatory Visit: Payer: Self-pay

## 2019-09-03 ENCOUNTER — Other Ambulatory Visit (INDEPENDENT_AMBULATORY_CARE_PROVIDER_SITE_OTHER): Payer: Medicare HMO

## 2019-09-03 DIAGNOSIS — E785 Hyperlipidemia, unspecified: Secondary | ICD-10-CM | POA: Diagnosis not present

## 2019-09-03 DIAGNOSIS — R739 Hyperglycemia, unspecified: Secondary | ICD-10-CM | POA: Diagnosis not present

## 2019-09-03 DIAGNOSIS — E559 Vitamin D deficiency, unspecified: Secondary | ICD-10-CM

## 2019-09-03 DIAGNOSIS — I10 Essential (primary) hypertension: Secondary | ICD-10-CM | POA: Diagnosis not present

## 2019-09-03 LAB — CBC
HCT: 39.4 % (ref 36.0–46.0)
Hemoglobin: 12.9 g/dL (ref 12.0–15.0)
MCHC: 32.6 g/dL (ref 30.0–36.0)
MCV: 90.6 fl (ref 78.0–100.0)
Platelets: 255 10*3/uL (ref 150.0–400.0)
RBC: 4.34 Mil/uL (ref 3.87–5.11)
RDW: 13.6 % (ref 11.5–15.5)
WBC: 7.4 10*3/uL (ref 4.0–10.5)

## 2019-09-03 LAB — HEMOGLOBIN A1C: Hgb A1c MFr Bld: 5.4 % (ref 4.6–6.5)

## 2019-09-03 LAB — COMPREHENSIVE METABOLIC PANEL
ALT: 13 U/L (ref 0–35)
AST: 13 U/L (ref 0–37)
Albumin: 3.9 g/dL (ref 3.5–5.2)
Alkaline Phosphatase: 74 U/L (ref 39–117)
BUN: 16 mg/dL (ref 6–23)
CO2: 27 mEq/L (ref 19–32)
Calcium: 8.9 mg/dL (ref 8.4–10.5)
Chloride: 105 mEq/L (ref 96–112)
Creatinine, Ser: 0.8 mg/dL (ref 0.40–1.20)
GFR: 85.32 mL/min (ref >60.00)
Glucose, Bld: 92 mg/dL (ref 70–99)
Potassium: 3.8 mEq/L (ref 3.5–5.1)
Sodium: 140 mEq/L (ref 135–145)
Total Bilirubin: 0.4 mg/dL (ref 0.2–1.2)
Total Protein: 6.9 g/dL (ref 6.0–8.3)

## 2019-09-03 LAB — LIPID PANEL
Cholesterol: 159 mg/dL (ref 0–200)
HDL: 38.7 mg/dL — ABNORMAL LOW (ref >39.00)
LDL Cholesterol: 113 mg/dL — ABNORMAL HIGH (ref 0–99)
NonHDL: 120.15
Total CHOL/HDL Ratio: 4
Triglycerides: 37 mg/dL (ref 0.0–149.0)
VLDL: 7.4 mg/dL (ref 0.0–40.0)

## 2019-09-03 LAB — TSH: TSH: 1.48 u[IU]/mL (ref 0.35–4.50)

## 2019-09-03 LAB — VITAMIN D 25 HYDROXY (VIT D DEFICIENCY, FRACTURES): VITD: 30.79 ng/mL (ref 30.00–100.00)

## 2019-09-08 DIAGNOSIS — H938X9 Other specified disorders of ear, unspecified ear: Secondary | ICD-10-CM | POA: Diagnosis not present

## 2019-09-08 DIAGNOSIS — H6983 Other specified disorders of Eustachian tube, bilateral: Secondary | ICD-10-CM | POA: Diagnosis not present

## 2019-09-08 DIAGNOSIS — J329 Chronic sinusitis, unspecified: Secondary | ICD-10-CM | POA: Diagnosis not present

## 2019-09-22 ENCOUNTER — Other Ambulatory Visit: Payer: Self-pay

## 2019-09-22 ENCOUNTER — Encounter: Payer: Self-pay | Admitting: Family Medicine

## 2019-09-22 ENCOUNTER — Ambulatory Visit (INDEPENDENT_AMBULATORY_CARE_PROVIDER_SITE_OTHER): Payer: Medicare HMO | Admitting: Family Medicine

## 2019-09-22 DIAGNOSIS — J302 Other seasonal allergic rhinitis: Secondary | ICD-10-CM

## 2019-09-22 DIAGNOSIS — E785 Hyperlipidemia, unspecified: Secondary | ICD-10-CM | POA: Diagnosis not present

## 2019-09-22 DIAGNOSIS — I10 Essential (primary) hypertension: Secondary | ICD-10-CM

## 2019-09-22 DIAGNOSIS — E559 Vitamin D deficiency, unspecified: Secondary | ICD-10-CM

## 2019-09-22 DIAGNOSIS — R739 Hyperglycemia, unspecified: Secondary | ICD-10-CM | POA: Diagnosis not present

## 2019-09-22 DIAGNOSIS — F4321 Adjustment disorder with depressed mood: Secondary | ICD-10-CM

## 2019-09-22 DIAGNOSIS — J3089 Other allergic rhinitis: Secondary | ICD-10-CM

## 2019-09-22 MED ORDER — LEVOCETIRIZINE DIHYDROCHLORIDE 5 MG PO TABS: 5.0000 mg | ORAL_TABLET | Freq: Every evening | ORAL | Status: DC

## 2019-09-22 NOTE — Progress Notes (Signed)
Subjective:    Patient ID: Anne Barnes, female    DOB: 1946-04-27, 73 y.o.   MRN: [id number]  No chief complaint on file.   HPI Patient is in today for follow-up on chronic medical concerns including hypertension, hyperlipidemia, hyperglycemia and more.  She continues to live with her daughter and grandsons after being widowed recently.  She acknowledges a lot of tension and stress anxiety insomnia and myalgias as a result.  Is having frequent headaches and a sense of lightheadedness.  Has recently seen ear nose and throat for her ongoing postnasal drip nasal congestion and diminished hearing.  They have her on an extended course of antibiotics antihistamines nasal steroids.  She believes it is helping partially but acknowledges she misses doses of medications routinely.  No fevers or chills.  No recent hospitalizations.  She denies any true vertigo or spinning sensation she has had no syncope or falls.  No other neurologic complaints are noted.  She does complain of generalized myalgias as well as chest wall tightness and neck tightness but no shortness of breath or other associated symptoms.  She is trying to maintain a heart healthy diet but her daughter does the cooking and she acknowledges too much sodium is added to the food.  She has taken her blood pressure medications this morning. Denies CP/palp/SOB/congestion/fevers/GI or GU c/o. Taking meds as prescribed  Past Medical History:  Diagnosis Date  . Acute combined systolic and diastolic heart failure ([place]) 05/26/2015  . ALLERGIC RHINITIS   . Allergic urticaria 08/01/2014   Patient reports allergies to shrimp, egg whites, tomatoes, dairy   . 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   Office Spirometry 11/07/17-WNL-FVC 1.58/82%, FEV1 1.27/85%, ratio 0.80, FEF 25-75% 0.26/89%  . Bradycardia 02/25/2017  . Chiari malformation    Noted MRI brain 09/2013 - s/p neuro eval for same  . DIVERTICULOSIS, COLON   . Essential hypertension 08/28/2010    Qualifier: Diagnosis of  By: Marca Ancona RMA, Lucy     . Frequent headaches   . GERD   . H/O measles   . H/O mumps   . Hearing loss 01/18/2017  . History of chicken pox   . Hyperglycemia 03/05/2016  . HYPERTENSION   . Hypocalcemia 02/25/2017  . IBS (irritable bowel syndrome) 05/26/2015  . Nonspecific abnormal electrocardiogram (ECG) (EKG)   . OBSTRUCTIVE SLEEP APNEA 12/2008 dx   noncompliant with CPAP qhs  . Obstructive sleep apnea 08/28/2010   NPSG 12/2008:  AHI 13/hr with desats to 78%   . Preventative health care 06/11/2017  . Sciatica    right side  . Seasonal and perennial allergic rhinitis 08/28/2010   Allergy profile 03/11/14- positive especially for dust, cat and dog Food allergy profile- total IgE 86 with several food group elevations Sed rate-WNL     . Sinusitis 06/06/2017  . 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 Vaginitis 01/18/2017  . Vitamin D deficiency 06/11/2017    Past Surgical History:  Procedure Laterality Date  . ABDOMINAL HYSTERECTOMY  1986  . BREAST SURGERY  1973   Breast biopsy  . UMBILICAL HERNIA REPAIR      Family History  Problem Relation Age of Onset  . Arthritis Mother        died of complications from hip surgery  . Arthritis Father   . Kidney disease Father   . Pneumonia Father   . Heart disease Father   . Lupus Daughter   . Rheum arthritis Daughter   . Sjogren's syndrome Daughter   .  Fibromyalgia Daughter   . Diabetes Brother   . Heart disease Brother   . Heart attack Brother   . Alcohol abuse Brother   . Throat cancer Brother   . Lung cancer Brother   . Kidney disease Sister        dialysis  . Obesity Sister   . Hypertension Maternal Grandfather   . Hypertension Sister   . Kidney disease Sister        dialysis  . Pneumonia Brother   . Hypertension Daughter   . Graves' disease Daughter   . Hypertension Daughter   . Alcohol abuse Other        parent  . Ataxia Neg Hx   . Chorea Neg Hx   . Dementia Neg Hx   . Mental retardation Neg Hx   .  Migraines Neg Hx   . Multiple sclerosis Neg Hx   . Neurofibromatosis Neg Hx   . Neuropathy Neg Hx   . Parkinsonism Neg Hx   . Seizures Neg Hx   . Stroke Neg Hx     Social History   Socioeconomic History  . Marital status: Married    Spouse name: Not on file  . Number of children: Not on file  . Years of education: Not on file  . Highest education level: Not on file  Occupational History  . Occupation: retired  Tobacco Use  . Smoking status: Never Smoker  . Smokeless tobacco: Never Used  . Tobacco comment: Married, lives with spouse, dtr 2 g-kids.  Substance and Sexual Activity  . Alcohol use: No    Comment: rare  . Drug use: No  . Sexual activity: Not Currently    Comment: lives with husband, no dietary restrictions avoid foods allergic to, retired  Other Topics Concern  . Not on file  Social History Narrative  . Not on file   Social Determinants of Health   Financial Resource Strain:   . Difficulty of Paying Living Expenses: Not on file  Food Insecurity:   . Worried About Charity fundraiser in the Last Year: Not on file  . Ran Out of Food in the Last Year: Not on file  Transportation Needs:   . Lack of Transportation (Medical): Not on file  . Lack of Transportation (Non-Medical): Not on file  Physical Activity:   . Days of Exercise per Week: Not on file  . Minutes of Exercise per Session: Not on file  Stress:   . Feeling of Stress : Not on file  Social Connections:   . Frequency of Communication with Friends and Family: Not on file  . Frequency of Social Gatherings with Friends and Family: Not on file  . Attends Religious Services: Not on file  . Active Member of Clubs or Organizations: Not on file  . Attends Archivist Meetings: Not on file  . Marital Status: Not on file  Intimate Partner Violence:   . Fear of Current or Ex-Partner: Not on file  . Emotionally Abused: Not on file  . Physically Abused: Not on file  . Sexually Abused: Not on file      Outpatient Medications Prior to Visit  Medication Sig Dispense Refill  . albuterol (PROVENTIL HFA;VENTOLIN HFA) 108 (90 Base) MCG/ACT inhaler Inhale 2 puffs into the lungs every 6 (six) hours as needed for wheezing or shortness of breath. 1 Inhaler 12  . ALPRAZolam (XANAX) 0.5 MG tablet Take 1 tablet (0.5 mg total) by mouth 2 (two) times daily  as needed for anxiety or sleep. 20 tablet 0  . amLODipine (NORVASC) 2.5 MG tablet Take 1 tablet (2.5 mg total) by mouth 2 (two) times daily. 180 tablet 2  . b complex vitamins tablet Take 1 tablet by mouth daily.    . Cholecalciferol (VITAMIN D) 2000 UNITS CAPS Take by mouth daily.     . fluticasone (FLONASE) 50 MCG/ACT nasal spray Place 2 sprays into both nostrils daily. 16 g 6  . ipratropium (ATROVENT) 0.06 % nasal spray 1-2 puffs each nostril twice daily as needed 15 mL 12  . losartan (COZAAR) 100 MG tablet Take 1 tablet (100 mg total) by mouth daily. 90 tablet 1  . Multiple Vitamin (MULTIVITAMIN) tablet Take 1 tablet by mouth daily.      Marland Kitchen omeprazole (PRILOSEC) 20 MG capsule Take 1 capsule by mouth once daily 90 capsule 0  . spironolactone (ALDACTONE) 25 MG tablet Take 0.5 tablets (12.5 mg total) by mouth daily. 30 tablet 3  . tiZANidine (ZANAFLEX) 2 MG tablet Take 0.5-2 tablets (1-4 mg total) by mouth 2 (two) times daily as needed for muscle spasms. 30 tablet 1   No facility-administered medications prior to visit.    Allergies  Allergen Reactions  . Codeine Nausea And Vomiting  . Hydralazine Hcl Other (See Comments)    Pt reports causes dizziness  . Lisinopril-Hydrochlorothiazide Hives    REACTION: lip swelling,facial swelling,rash.  . Metoprolol Other (See Comments)    Does not feel well on it   . Other Cough    Patient states she is allergic to Shrimp, egg whites, tomatoes and dairy products.  Says they make her feel unwell but do not cause hives, SOB or anaphylaxis.  She just avoids eating them because they can make her have a  headache.  Strawberries, watermelon, peanuts, and all shellfish.      Review of Systems  Constitutional: Positive for malaise/fatigue. Negative for fever.  HENT: Positive for congestion and hearing loss. Negative for ear discharge and ear pain.   Eyes: Negative for blurred vision.  Respiratory: Positive for sputum production. Negative for shortness of breath.   Cardiovascular: Positive for chest pain. Negative for palpitations and leg swelling.  Gastrointestinal: Negative for abdominal pain, blood in stool and nausea.  Genitourinary: Negative for dysuria and frequency.  Musculoskeletal: Positive for myalgias and neck pain. Negative for falls.  Skin: Negative for rash.  Neurological: Positive for dizziness and headaches. Negative for loss of consciousness.  Endo/Heme/Allergies: Negative for environmental allergies.  Psychiatric/Behavioral: Positive for depression. The patient is nervous/anxious and has insomnia.        Objective:    Physical Exam Vitals and nursing note reviewed.  Constitutional:      General: She is not in acute distress.    Appearance: She is well-developed.  HENT:     Head: Normocephalic and atraumatic.     Nose: Nose normal.  Eyes:     General:        Right eye: No discharge.        Left eye: No discharge.  Cardiovascular:     Rate and Rhythm: Normal rate and regular rhythm.     Heart sounds: No murmur.  Pulmonary:     Effort: Pulmonary effort is normal.     Breath sounds: Normal breath sounds.  Abdominal:     General: Bowel sounds are normal.     Palpations: Abdomen is soft.     Tenderness: There is no abdominal tenderness.  Musculoskeletal:  Cervical back: Normal range of motion and neck supple.  Skin:    General: Skin is warm and dry.  Neurological:     Mental Status: She is alert and oriented to person, place, and time.     BP (!) 150/72   Pulse 82   Temp 98 F (36.7 C) (Temporal)   Resp 18   Wt 214 lb 6.4 oz (97.3 kg)   SpO2 100%    BMI 42.58 kg/m  Wt Readings from Last 3 Encounters:  09/22/19 214 lb 6.4 oz (97.3 kg)  04/17/19 213 lb 3.2 oz (96.7 kg)  04/02/19 214 lb 9.6 oz (97.3 kg)    Diabetic Foot Exam - Simple   No data filed     Lab Results  Component Value Date   WBC 7.4 09/03/2019   HGB 12.9 09/03/2019   HCT 39.4 09/03/2019   PLT 255.0 09/03/2019   GLUCOSE 92 09/03/2019   CHOL 159 09/03/2019   TRIG 37.0 09/03/2019   HDL 38.70 (L) 09/03/2019   LDLCALC 113 (H) 09/03/2019   ALT 13 09/03/2019   AST 13 09/03/2019   NA 140 09/03/2019   K 3.8 09/03/2019   CL 105 09/03/2019   CREATININE 0.80 09/03/2019   BUN 16 09/03/2019   CO2 27 09/03/2019   TSH 1.48 09/03/2019   INR 1.06 01/10/2015   HGBA1C 5.4 09/03/2019    Lab Results  Component Value Date   TSH 1.48 09/03/2019   Lab Results  Component Value Date   WBC 7.4 09/03/2019   HGB 12.9 09/03/2019   HCT 39.4 09/03/2019   MCV 90.6 09/03/2019   PLT 255.0 09/03/2019   Lab Results  Component Value Date   NA 140 09/03/2019   K 3.8 09/03/2019   CO2 27 09/03/2019   GLUCOSE 92 09/03/2019   BUN 16 09/03/2019   CREATININE 0.80 09/03/2019   BILITOT 0.4 09/03/2019   ALKPHOS 74 09/03/2019   AST 13 09/03/2019   ALT 13 09/03/2019   PROT 6.9 09/03/2019   ALBUMIN 3.9 09/03/2019   CALCIUM 8.9 09/03/2019   ANIONGAP 9 09/15/2017   GFR 85.32 09/03/2019   Lab Results  Component Value Date   CHOL 159 09/03/2019   Lab Results  Component Value Date   HDL 38.70 (L) 09/03/2019   Lab Results  Component Value Date   LDLCALC 113 (H) 09/03/2019   Lab Results  Component Value Date   TRIG 37.0 09/03/2019   Lab Results  Component Value Date   CHOLHDL 4 09/03/2019   Lab Results  Component Value Date   HGBA1C 5.4 09/03/2019       Assessment & Plan:   Problem List Items Addressed This Visit    Essential hypertension    no changes to meds. Encouraged heart healthy diet such as the DASH diet and exercise as tolerated.       Seasonal and  perennial allergic rhinitis    Is now following with ENT and is on an extended course of antibiotics and antihistamines but she admits she misses doses. She is encouraged to take regularly and follow up with them she is still experiencing some episodes of feeling light headed and some decreased hearing at times.       Hyperglycemia    hgba1c acceptable, minimize simple carbs. Increase exercise as tolerated.       Vitamin D deficiency    Supplement and monitor      Hyperlipidemia    Encouraged heart healthy diet, increase exercise,  avoid trans fats, consider a krill oil cap daily      Grief reaction    Was widowed recently and very tense and having headaches, neck pain, chest wall pain and she notes muscle relaxers have helped some so continue with heat, stretching, meds and report if worse. For headaches hydrate and rest and report worsening         I am having Anne Barnes start on levocetirizine. I am also having her maintain her multivitamin, Vitamin D, b complex vitamins, amLODipine, spironolactone, ipratropium, albuterol, omeprazole, fluticasone, losartan, ALPRAZolam, and tiZANidine.  Meds ordered this encounter  Medications  . levocetirizine (XYZAL) 5 MG tablet    Sig: Take 1 tablet (5 mg total) by mouth every evening.    Dispense:        Penni Homans, MD

## 2019-09-22 NOTE — Assessment & Plan Note (Signed)
hgba1c acceptable, minimize simple carbs. Increase exercise as tolerated.  

## 2019-09-22 NOTE — Assessment & Plan Note (Signed)
Is now following with ENT and is on an extended course of antibiotics and antihistamines but she admits she misses doses. She is encouraged to take regularly and follow up with them she is still experiencing some episodes of feeling light headed and some decreased hearing at times.

## 2019-09-22 NOTE — Assessment & Plan Note (Signed)
Encouraged heart healthy diet, increase exercise, avoid trans fats, consider a krill oil cap daily 

## 2019-09-22 NOTE — Assessment & Plan Note (Signed)
no changes to meds. Encouraged heart healthy diet such as the DASH diet and exercise as tolerated.  

## 2019-09-22 NOTE — Patient Instructions (Addendum)
Pulse Oximeter want to keep numbers in 90s   Mucinex plain (Guaifenasin) twice daily  Move Losartan to bedtime, start tomorrow with 1/2 tab in am and then 1/2 tab in pm then the next day move full tab to bedtime  Minimize sodium in diet Hypertension, Adult High blood pressure (hypertension) is when the force of blood pumping through the arteries is too strong. The arteries are the blood vessels that carry blood from the heart throughout t  he body. Hypertension forces the heart to work harder to pump blood and may cause arteries to become narrow or stiff. Untreated or uncontrolled hypertension can cause a heart attack, heart failure, a stroke, kidney disease, and other problems. A blood pressure reading consists of a higher number over a lower number. Ideally, your blood pressure should be below 120/80. The first ("top") number is called the systolic pressure. It is a measure of the pressure in your arteries as your heart beats. The second ("bottom") number is called the diastolic pressure. It is a measure of the pressure in your arteries as the heart relaxes. What are the causes? The exact cause of this condition is not known. There are some conditions that result in or are related to high blood pressure. What increases the risk? Some risk factors for high blood pressure are under your control. The following factors may make you more likely to develop this condition:  Smoking.  Having type 2 diabetes mellitus, high cholesterol, or both.  Not getting enough exercise or physical activity.  Being overweight.  Having too much fat, sugar, calories, or salt (sodium) in your diet.  Drinking too much alcohol. Some risk factors for high blood pressure may be difficult or impossible to change. Some of these factors include:  Having chronic kidney disease.  Having a family history of high blood pressure.  Age. Risk increases with age.  Race. You may be at higher risk if you are African  American.  Gender. Men are at higher risk than women before age 79. After age 67, women are at higher risk than men.  Having obstructive sleep apnea.  Stress. What are the signs or symptoms? High blood pressure may not cause symptoms. Very high blood pressure (hypertensive crisis) may cause:  Headache.  Anxiety.  Shortness of breath.  Nosebleed.  Nausea and vomiting.  Vision changes.  Severe chest pain.  Seizures. How is this diagnosed? This condition is diagnosed by measuring your blood pressure while you are seated, with your arm resting on a flat surface, your legs uncrossed, and your feet flat on the floor. The cuff of the blood pressure monitor will be placed directly against the skin of your upper arm at the level of your heart. It should be measured at least twice using the same arm. Certain conditions can cause a difference in blood pressure between your right and left arms. Certain factors can cause blood pressure readings to be lower or higher than normal for a short period of time:  When your blood pressure is higher when you are in a health care provider's office than when you are at home, this is called white coat hypertension. Most people with this condition do not need medicines.  When your blood pressure is higher at home than when you are in a health care provider's office, this is called masked hypertension. Most people with this condition may need medicines to control blood pressure. If you have a high blood pressure reading during one visit or you have  normal blood pressure with other risk factors, you may be asked to:  Return on a different day to have your blood pressure checked again.  Monitor your blood pressure at home for 1 week or longer. If you are diagnosed with hypertension, you may have other blood or imaging tests to help your health care provider understand your overall risk for other conditions. How is this treated? This condition is treated by  making healthy lifestyle changes, such as eating healthy foods, exercising more, and reducing your alcohol intake. Your health care provider may prescribe medicine if lifestyle changes are not enough to get your blood pressure under control, and if:  Your systolic blood pressure is above 130.  Your diastolic blood pressure is above 80. Your personal target blood pressure may vary depending on your medical conditions, your age, and other factors. Follow these instructions at home: Eating and drinking   Eat a diet that is high in fiber and potassium, and low in sodium, added sugar, and fat. An example eating plan is called the DASH (Dietary Approaches to Stop Hypertension) diet. To eat this way: ? Eat plenty of fresh fruits and vegetables. Try to fill one half of your plate at each meal with fruits and vegetables. ? Eat whole grains, such as whole-wheat pasta, brown rice, or whole-grain bread. Fill about one fourth of your plate with whole grains. ? Eat or drink low-fat dairy products, such as skim milk or low-fat yogurt. ? Avoid fatty cuts of meat, processed or cured meats, and poultry with skin. Fill about one fourth of your plate with lean proteins, such as fish, chicken without skin, beans, eggs, or tofu. ? Avoid pre-made and processed foods. These tend to be higher in sodium, added sugar, and fat.  Reduce your daily sodium intake. Most people with hypertension should eat less than 1,500 mg of sodium a day.  Do not drink alcohol if: ? Your health care provider tells you not to drink. ? You are pregnant, may be pregnant, or are planning to become pregnant.  If you drink alcohol: ? Limit how much you use to:  0-1 drink a day for women.  0-2 drinks a day for men. ? Be aware of how much alcohol is in your drink. In the [place] one drink equals one 12 oz bottle of beer (355 mL), one 5 oz glass of wine (148 mL), or one 1 oz glass of hard liquor (44 mL). Lifestyle   Work with your health  care provider to maintain a healthy body weight or to lose weight. Ask what an ideal weight is for you.  Get at least 30 minutes of exercise most days of the week. Activities may include walking, swimming, or biking.  Include exercise to strengthen your muscles (resistance exercise), such as Pilates or lifting weights, as part of your weekly exercise routine. Try to do these types of exercises for 30 minutes at least 3 days a week.  Do not use any products that contain nicotine or tobacco, such as cigarettes, e-cigarettes, and chewing tobacco. If you need help quitting, ask your health care provider.  Monitor your blood pressure at home as told by your health care provider.  Keep all follow-up visits as told by your health care provider. This is important. Medicines  Take over-the-counter and prescription medicines only as told by your health care provider. Follow directions carefully. Blood pressure medicines must be taken as prescribed.  Do not skip doses of blood pressure medicine. Doing this  puts you at risk for problems and can make the medicine less effective.  Ask your health care provider about side effects or reactions to medicines that you should watch for. Contact a health care provider if you:  Think you are having a reaction to a medicine you are taking.  Have headaches that keep coming back (recurring).  Feel dizzy.  Have swelling in your ankles.  Have trouble with your vision. Get help right away if you:  Develop a severe headache or confusion.  Have unusual weakness or numbness.  Feel faint.  Have severe pain in your chest or abdomen.  Vomit repeatedly.  Have trouble breathing. Summary  Hypertension is when the force of blood pumping through your arteries is too strong. If this condition is not controlled, it may put you at risk for serious complications.  Your personal target blood pressure may vary depending on your medical conditions, your age, and  other factors. For most people, a normal blood pressure is less than 120/80.  Hypertension is treated with lifestyle changes, medicines, or a combination of both. Lifestyle changes include losing weight, eating a healthy, low-sodium diet, exercising more, and limiting alcohol. This information is not intended to replace advice given to you by your health care provider. Make sure you discuss any questions you have with your health care provider. Document Released: 09/10/2005 Document Revised: 05/21/2018 Document Reviewed: 05/21/2018 Elsevier Patient Education  2020 [employer].

## 2019-09-22 NOTE — Assessment & Plan Note (Signed)
Supplement and monitor 

## 2019-09-22 NOTE — Assessment & Plan Note (Signed)
Was widowed recently and very tense and having headaches, neck pain, chest wall pain and she notes muscle relaxers have helped some so continue with heat, stretching, meds and report if worse. For headaches hydrate and rest and report worsening

## 2019-09-23 DIAGNOSIS — G4733 Obstructive sleep apnea (adult) (pediatric): Secondary | ICD-10-CM | POA: Diagnosis not present

## 2019-10-06 ENCOUNTER — Other Ambulatory Visit: Payer: Self-pay | Admitting: Cardiology

## 2019-10-15 ENCOUNTER — Telehealth: Payer: Self-pay

## 2019-10-15 NOTE — Telephone Encounter (Signed)
Pt did not answer. Called pt to verify medications and consent for video visit tomorrow.

## 2019-10-16 ENCOUNTER — Telehealth (INDEPENDENT_AMBULATORY_CARE_PROVIDER_SITE_OTHER): Payer: Medicare HMO | Admitting: Cardiology

## 2019-10-16 ENCOUNTER — Other Ambulatory Visit: Payer: Self-pay

## 2019-10-16 ENCOUNTER — Encounter: Payer: Self-pay | Admitting: Cardiology

## 2019-10-16 ENCOUNTER — Encounter: Payer: Self-pay | Admitting: *Deleted

## 2019-10-16 VITALS — BP 137/65 | HR 54 | Wt 210.0 lb

## 2019-10-16 DIAGNOSIS — R079 Chest pain, unspecified: Secondary | ICD-10-CM | POA: Diagnosis not present

## 2019-10-16 DIAGNOSIS — R600 Localized edema: Secondary | ICD-10-CM | POA: Diagnosis not present

## 2019-10-16 DIAGNOSIS — I10 Essential (primary) hypertension: Secondary | ICD-10-CM | POA: Diagnosis not present

## 2019-10-16 DIAGNOSIS — R072 Precordial pain: Secondary | ICD-10-CM

## 2019-10-16 DIAGNOSIS — R0602 Shortness of breath: Secondary | ICD-10-CM

## 2019-10-16 NOTE — Patient Instructions (Signed)
Medication Instructions:   Your physician recommends that you continue on your current medications as directed. Please refer to the Current Medication list given to you today.  *If you need a refill on your cardiac medications before your next appointment, please call your pharmacy*   Lab Work:  BM[facility]--WE WILL CALL YOU TO ARRANGE THIS LAB  If you have labs (blood work) drawn today and your tests are completely normal, you will receive your results only by: Marland Kitchen MyChart Message (if you have MyChart) OR . A paper copy in the mail If you have any lab test that is abnormal or we need to change your treatment, we will call you to review the results.   Testing/Procedures:  CORONARY CT Your cardiac CT will be scheduled at one of the below locations:   [facility] [address] [place], [place] [address] [phone]   If scheduled at [facility], please arrive at the [facility] main entrance of [facility] 30-45 minutes prior to test start time. Proceed to the [facility] Radiology Department (first floor) to check-in and test prep.   Please follow these instructions carefully (unless otherwise directed):   On the Night Before the Test: . Be sure to Drink plenty of water. . Do not consume any caffeinated/decaffeinated beverages or chocolate 12 hours prior to your test. . Do not take any antihistamines 12 hours prior to your test.   On the Day of the Test: . Drink plenty of water. Do not drink any water within one hour of the test. . Do not eat any food 4 hours prior to the test. . You may take your regular medications prior to the test.  . FEMALES- please wear underwire-free bra if available       After the Test: . Drink plenty of water. . After receiving IV contrast, you may experience a mild flushed feeling. This is normal. . On occasion, you may experience a mild rash up to 24 hours after the test.  This is not dangerous. If this occurs, you can take Benadryl 25 mg and increase your fluid intake. . If you experience trouble breathing, this can be serious. If it is severe call 911 IMMEDIATELY. If it is mild, please call our office.  Once we have confirmed authorization from your insurance company, we will call you to set up a date and time for your test.   For non-scheduling related questions, please contact the cardiac imaging nurse navigator should you have any questions/concerns: Marchia Bond, RN Navigator Cardiac Imaging [facility] [phone] Office     Follow-Up: At [facility], you and your health needs are our priority.  As part of our continuing mission to provide you with exceptional heart care, we have created designated Provider Care Teams.  These Care Teams include your primary Cardiologist (physician) and Advanced Practice Providers (APPs -  Physician Assistants and Nurse Practitioners) who all work together to provide you with the care you need, when you need it.  Your next appointment:   Feb 17, 2020 AT 8:20 AM WITH DR. Meda Coffee IN THE OFFICE   The format for your next appointment:   In [place]   Provider:   Ena Dawley, MD

## 2019-10-16 NOTE — Progress Notes (Signed)
Virtual Visit via Video Note   This visit type was conducted due to national recommendations for restrictions regarding the COVID-19 Pandemic (e.g. social distancing) in an effort to limit this patient's exposure and mitigate transmission in our community.  Due to her co-morbid illnesses, this patient is at least at moderate risk for complications without adequate follow up.  This format is felt to be most appropriate for this patient at this time.  All issues noted in this document were discussed and addressed.  A limited physical exam was performed with this format.  Please refer to the patient's chart for her consent to telehealth for [facility].   Evaluation Performed:  Follow-up visit, 3 months  Date:  10/16/2019   ID:  Anne Barnes, DOB 1946/06/27, MRN [id number]  Patient Location: Home Provider Location: Home  PCP:  Mosie Lukes, MD  Cardiologist:  Ena Dawley, MD   Chief Complaint:  Chest pain  History of Present Illness:    Anne Barnes is a 74 y.o. female followed for female was seen in June 2018 for bradycardia down to low 50s while on beta blocker. The patient was asymptomatic at the time and states that she has always had slow heart rate. Pt with hx of HTN, hyperglycemia, mild HLD, osteoarthritis, tinnitus, IBS, OSA intolerant to CPAP and headaches thought to be allergies.  Dr. Annamaria Boots follows her sleep apnea. Echo done 02/2017 EF was 60-65% no RWMA,  No significant valvular abnormalities. She has been using low-dose of spironolactone for lower extremity edema that has been stable over last couple years. She has been very careful with her diet and walks with her daughter in a park several times a week.  Today she states that she has been having on and off left-sided chest pains that can last 15 to 20 minutes, she has not noticed any alleviating factors, they just go away on their own they are not associated with shortness of breath palpitations or syncope.  Her  lower extremity edema has been stable, every once while she takes whole pill of spironolactone when her edema gets worse.  She has been compliant with her medications and her blood pressure is controlled sometimes gets into 140s.  The patient does not have symptoms concerning for COVID-19 infection (fever, chills, cough, or new shortness of breath).   Past Medical History:  Diagnosis Date  . Acute combined systolic and diastolic heart failure ([place]) 05/26/2015  . ALLERGIC RHINITIS   . Allergic urticaria 08/01/2014   Patient reports allergies to shrimp, egg whites, tomatoes, dairy   . 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   Office Spirometry 11/07/17-WNL-FVC 1.58/82%, FEV1 1.27/85%, ratio 0.80, FEF 25-75% 0.26/89%  . Bradycardia 02/25/2017  . Chiari malformation    Noted MRI brain 09/2013 - s/p neuro eval for same  . DIVERTICULOSIS, COLON   . Essential hypertension 08/28/2010   Qualifier: Diagnosis of  By: Marca Ancona RMA, Lucy     . Frequent headaches   . GERD   . H/O measles   . H/O mumps   . Hearing loss 01/18/2017  . History of chicken pox   . Hyperglycemia 03/05/2016  . HYPERTENSION   . Hypocalcemia 02/25/2017  . IBS (irritable bowel syndrome) 05/26/2015  . Nonspecific abnormal electrocardiogram (ECG) (EKG)   . OBSTRUCTIVE SLEEP APNEA 12/2008 dx   noncompliant with CPAP qhs  . Obstructive sleep apnea 08/28/2010   NPSG 12/2008:  AHI 13/hr with desats to 78%   . Preventative health care 06/11/2017  .  Sciatica    right side  . Seasonal and perennial allergic rhinitis 08/28/2010   Allergy profile 03/11/14- positive especially for dust, cat and dog Food allergy profile- total IgE 86 with several food group elevations Sed rate-WNL     . Sinusitis 06/06/2017  . 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 Vaginitis 01/18/2017  . Vitamin D deficiency 06/11/2017   Past Surgical History:  Procedure Laterality Date  . ABDOMINAL HYSTERECTOMY  1986  . BREAST SURGERY  1973   Breast biopsy  . UMBILICAL HERNIA REPAIR        Current Meds  Medication Sig  . albuterol (PROVENTIL HFA;VENTOLIN HFA) 108 (90 Base) MCG/ACT inhaler Inhale 2 puffs into the lungs every 6 (six) hours as needed for wheezing or shortness of breath.  . ALPRAZolam (XANAX) 0.5 MG tablet Take 1 tablet (0.5 mg total) by mouth 2 (two) times daily as needed for anxiety or sleep.  Marland Kitchen amLODipine (NORVASC) 2.5 MG tablet Take 1 tablet (2.5 mg total) by mouth 2 (two) times daily.  Marland Kitchen amoxicillin (AMOXIL) 500 MG tablet Take 500 mg by mouth 2 (two) times daily. Last dose on 10/16/19  . b complex vitamins tablet Take 1 tablet by mouth daily.  . Cholecalciferol (VITAMIN D) 2000 UNITS CAPS Take by mouth daily.   . fluticasone (FLONASE) 50 MCG/ACT nasal spray Place 2 sprays into both nostrils daily.  Marland Kitchen ipratropium (ATROVENT) 0.06 % nasal spray 1-2 puffs each nostril twice daily as needed  . levocetirizine (XYZAL) 5 MG tablet Take 1 tablet (5 mg total) by mouth every evening.  Marland Kitchen losartan (COZAAR) 100 MG tablet Take 1 tablet (100 mg total) by mouth daily.  . Multiple Vitamin (MULTIVITAMIN) tablet Take 1 tablet by mouth daily.    Marland Kitchen omeprazole (PRILOSEC) 20 MG capsule Take 1 capsule by mouth once daily  . spironolactone (ALDACTONE) 25 MG tablet Take 1/2 (one-half) tablet by mouth once daily  . tiZANidine (ZANAFLEX) 2 MG tablet Take 0.5-2 tablets (1-4 mg total) by mouth 2 (two) times daily as needed for muscle spasms.    Allergies:   Codeine, Hydralazine hcl, Lisinopril-hydrochlorothiazide, Metoprolol, Other, Shrimp [shellfish allergy], and Tomato   Social History   Tobacco Use  . Smoking status: Never Smoker  . Smokeless tobacco: Never Used  . Tobacco comment: Married, lives with spouse, dtr 2 g-kids.  Substance Use Topics  . Alcohol use: No    Comment: rare  . Drug use: No    Family Hx: The patient's family history includes Alcohol abuse in her brother and another family member; Arthritis in her father and mother; Diabetes in her brother; Fibromyalgia in  her daughter; Anne Barnes' disease in her daughter; Heart attack in her brother; Heart disease in her brother and father; Hypertension in her daughter, daughter, maternal grandfather, and sister; Kidney disease in her father, sister, and sister; Lung cancer in her brother; Lupus in her daughter; Obesity in her sister; Pneumonia in her brother and father; Rheum arthritis in her daughter; Sjogren's syndrome in her daughter; Throat cancer in her brother. There is no history of Ataxia, Chorea, Dementia, Mental retardation, Migraines, Multiple sclerosis, Neurofibromatosis, Neuropathy, Parkinsonism, Seizures, or Stroke.  ROS:   Please see the history of present illness.    All other systems reviewed and are negative.  Prior CV studies:   The following studies were reviewed today:  Labs/Other Tests and Data Reviewed:    EKG:  No ECG reviewed.  Recent Labs: 02/17/2019: NT-Pro BNP 58 09/03/2019: ALT 13; BUN  16; Creatinine, Ser 0.80; Hemoglobin 12.9; Platelets 255.0; Potassium 3.8; Sodium 140; TSH 1.48   Recent Lipid Panel Lab Results  Component Value Date/Time   CHOL 159 09/03/2019 09:52 AM   TRIG 37.0 09/03/2019 09:52 AM   TRIG 55 12/30/2009 12:00 AM   HDL 38.70 (L) 09/03/2019 09:52 AM   CHOLHDL 4 09/03/2019 09:52 AM   LDLCALC 113 (H) 09/03/2019 09:52 AM    Wt Readings from Last 3 Encounters:  10/16/19 210 lb (95.3 kg)  09/22/19 214 lb 6.4 oz (97.3 kg)  04/17/19 213 lb 3.2 oz (96.7 kg)     Objective:    Vital Signs:  BP 137/65   Pulse (!) 54   Wt 210 lb (95.3 kg)   BMI 41.71 kg/m    VITAL SIGNS:  reviewed  ASSESSMENT & PLAN:    1.    Chest pain -nonexertional -Risk factor include hypertension, borderline LDL of 113 in December 2020.  We will obtain coronary CTA to further evaluate if she has evidence for coronary artery disease we will advised to start lipid-lowering therapy.  2. Hypertension -much better controlled, I would continue current regimen, she is also very careful about  her diet and walks regularly.   2.  Bilateral lower extremity edema -intolerant to Lasix on low-dose spironolactone.  4. Sinus brady with fatigue.  Atenolol was discontinued her heart rate remained slow but with improved energy.     5. HLD - LDL 113, we will consider initiation of therapy if she has CAD on coronary CTA.Marland Kitchen   COVID-19 Education: The signs and symptoms of COVID-19 were discussed with the patient and how to seek care for testing (follow up with PCP or arrange E-visit).  The importance of social distancing was discussed today.  Time:   Today, I have spent 20 minutes with the patient with telehealth technology discussing the above problems.     Medication Adjustments/Labs and Tests Ordered: Current medicines are reviewed at length with the patient today.  Concerns regarding medicines are outlined above.   Tests Ordered: No orders of the defined types were placed in this encounter.   Medication Changes: No orders of the defined types were placed in this encounter.   Disposition:  Follow up in 6 month(s)  Signed, Ena Dawley, MD  10/16/2019 8:54 AM    [facility]

## 2019-10-20 DIAGNOSIS — M26623 Arthralgia of bilateral temporomandibular joint: Secondary | ICD-10-CM | POA: Diagnosis not present

## 2019-10-20 DIAGNOSIS — J329 Chronic sinusitis, unspecified: Secondary | ICD-10-CM | POA: Diagnosis not present

## 2019-10-20 DIAGNOSIS — H6983 Other specified disorders of Eustachian tube, bilateral: Secondary | ICD-10-CM | POA: Diagnosis not present

## 2019-10-21 DIAGNOSIS — H903 Sensorineural hearing loss, bilateral: Secondary | ICD-10-CM | POA: Diagnosis not present

## 2019-10-27 ENCOUNTER — Ambulatory Visit: Payer: Medicare HMO | Admitting: Family Medicine

## 2019-11-04 DIAGNOSIS — M26623 Arthralgia of bilateral temporomandibular joint: Secondary | ICD-10-CM | POA: Diagnosis not present

## 2019-11-04 DIAGNOSIS — J342 Deviated nasal septum: Secondary | ICD-10-CM | POA: Diagnosis not present

## 2019-11-04 DIAGNOSIS — J329 Chronic sinusitis, unspecified: Secondary | ICD-10-CM | POA: Diagnosis not present

## 2019-11-04 DIAGNOSIS — R42 Dizziness and giddiness: Secondary | ICD-10-CM | POA: Diagnosis not present

## 2019-11-06 ENCOUNTER — Encounter: Payer: Self-pay | Admitting: Internal Medicine

## 2019-11-09 ENCOUNTER — Other Ambulatory Visit: Payer: Medicare HMO

## 2019-11-09 ENCOUNTER — Other Ambulatory Visit: Payer: Self-pay

## 2019-11-09 DIAGNOSIS — R0602 Shortness of breath: Secondary | ICD-10-CM | POA: Diagnosis not present

## 2019-11-09 DIAGNOSIS — R072 Precordial pain: Secondary | ICD-10-CM | POA: Diagnosis not present

## 2019-11-09 DIAGNOSIS — I10 Essential (primary) hypertension: Secondary | ICD-10-CM | POA: Diagnosis not present

## 2019-11-10 LAB — BASIC METABOLIC PANEL
BUN/Creatinine Ratio: 19 (ref 12–28)
BUN: 17 mg/dL (ref 8–27)
CO2: 26 mmol/L (ref 20–29)
Calcium: 9.2 mg/dL (ref 8.7–10.3)
Chloride: 102 mmol/L (ref 96–106)
Creatinine, Ser: 0.88 mg/dL (ref 0.57–1.00)
GFR calc Af Amer: 76 mL/min/{1.73_m2} (ref >59)
GFR calc non Af Amer: 66 mL/min/{1.73_m2} (ref >59)
Glucose: 94 mg/dL (ref 65–99)
Potassium: 4.5 mmol/L (ref 3.5–5.2)
Sodium: 141 mmol/L (ref 134–144)

## 2019-11-17 ENCOUNTER — Other Ambulatory Visit: Payer: Self-pay | Admitting: Cardiology

## 2019-11-19 ENCOUNTER — Telehealth: Payer: Self-pay | Admitting: Cardiology

## 2019-11-19 ENCOUNTER — Encounter (HOSPITAL_COMMUNITY): Payer: Self-pay

## 2019-11-19 ENCOUNTER — Encounter: Payer: Self-pay | Admitting: Pulmonary Disease

## 2019-11-19 ENCOUNTER — Other Ambulatory Visit: Payer: Self-pay

## 2019-11-19 ENCOUNTER — Ambulatory Visit (INDEPENDENT_AMBULATORY_CARE_PROVIDER_SITE_OTHER): Payer: Medicare HMO | Admitting: Pulmonary Disease

## 2019-11-19 ENCOUNTER — Telehealth (HOSPITAL_COMMUNITY): Payer: Self-pay | Admitting: Emergency Medicine

## 2019-11-19 DIAGNOSIS — J4520 Mild intermittent asthma, uncomplicated: Secondary | ICD-10-CM | POA: Diagnosis not present

## 2019-11-19 DIAGNOSIS — J302 Other seasonal allergic rhinitis: Secondary | ICD-10-CM | POA: Diagnosis not present

## 2019-11-19 DIAGNOSIS — G4733 Obstructive sleep apnea (adult) (pediatric): Secondary | ICD-10-CM

## 2019-11-19 DIAGNOSIS — R42 Dizziness and giddiness: Secondary | ICD-10-CM

## 2019-11-19 DIAGNOSIS — J3089 Other allergic rhinitis: Secondary | ICD-10-CM

## 2019-11-19 DIAGNOSIS — R001 Bradycardia, unspecified: Secondary | ICD-10-CM

## 2019-11-19 MED ORDER — PREDNISONE 10 MG PO TABS: ORAL_TABLET | ORAL | 0 refills | Status: DC

## 2019-11-19 MED ORDER — AZITHROMYCIN 250 MG PO TABS: ORAL_TABLET | ORAL | 0 refills | Status: DC

## 2019-11-19 NOTE — Assessment & Plan Note (Signed)
Agree with ENTs recommendations the patient needs to follow-up with cardiology regarding dizziness  Plan: Patient to contact cardiology today reporting no symptoms

## 2019-11-19 NOTE — Progress Notes (Signed)
Virtual Visit via Telephone Note  I connected with Anne Barnes on 11/19/19 at 10:30 AM EST by telephone and verified that I am speaking with the correct person using two identifiers.  Location: Patient: Home Provider: Office [facility] - [id number], [place], [place], [place] [address]   I discussed the limitations, risks, security and privacy concerns of performing an evaluation and management service by telephone and the availability of in person appointments. I also discussed with the patient that there may be a patient responsible charge related to this service. The patient expressed understanding and agreed to proceed.  Patient consented to consult via telephone: Yes People present and their role in pt care: Pt   History of Present Illness:  74 year old female never smoker followed in our office for obstructive sleep apnea (on CPAP therapy), allergic rhinitis and asthma  Past medical history: Hypertension, GERD, Chiari malformation, IBS, vitamin D deficiency, hyperlipidemia Smoking history: Never smoker Maintenance: Anne Barnes Patient of Dr. Annamaria Boots  Chief complaint: ear fullness  74 year old female never smoker followed in our office for obstructive sleep apnea and allergic rhinitis.  She is not on any maintenance inhalers.  She is a patient of Dr. Annamaria Boots.  She was last seen by Dr. Annamaria Boots in March/2020.  At that point time she was encouraged to continue her nasal regimen as well as remain on CPAP.  She reports she is doing well with CPAP therapy.  Unfortunately patient reports that she lost her husband last year.  This is caused her to not prioritize some of her health symptoms.  She is working through that at this point in time.  She reports that for the last 2 to 3 months she has had bouts of dizziness as well as increased head congestion.  Primary care referred patient to ear nose and throat.  Dr. Erik Obey examined the patient on 11/04/2019.  That note was reviewed.  CT of  sinuses was clear.  Patient continues to have symptoms.  She reports sinus pressure daily.  She was encouraged to follow-up with cardiology.  She reports she has not done this yet regarding her dizziness.  Cardiologist is Dr. Meda Coffee  Patient reports that she is taking Xyzal daily, adding in Claritin as previously recommended by Dr. Annamaria Boots as a "helper medication", using nasal saline mist spray 3-4 times daily, and using Flonase nasal spray.  She continues to report increased congestion.  Sinus pressure.  Patient denies fevers.  She reports today that she wants "stronger medications for her symptoms".  Patient does not believe that over-the-counter medications are sufficient.  Patient with known history of food allergies as well as environmental allergies.  Patient does not have any pets.  She reports that about 95% of the time she avoids the foods that she has allergies to.  Observations/Objective:  Peanut and cat cause coughing and we advised her to avoid them Allergy profile 03/11/14- positive especially for dust, cat and dog Food allergy profile- total IgE 86 with several food group elevations  Office Spirometry 11/07/17-WNL-FVC 1.58/82%, FEV1 1.27/85%, ratio 0.80, FEF 25-75% 0.26/89%  HST 12/05/2017-AHI 12.3/hour, desaturation to 82%, body weight 226 pounds  Social History   Tobacco Use  Smoking Status Never Smoker  Smokeless Tobacco Never Used  Tobacco Comment   Married, lives with spouse, dtr 2 g-kids.   Immunization History  Administered Date(s) Administered  . Influenza, High Dose Seasonal PF 07/15/2013, 06/06/2018  . Influenza,inj,Quad PF,6+ Mos 08/29/2015  . Pneumococcal Conjugate-13 11/30/2015  . Pneumococcal  Polysaccharide-23 02/26/2013  . Td 09/25/2003  . Tdap 08/29/2015      Assessment and Plan:  Obstructive sleep apnea Plan: Continue CPAP  Asthma, mild intermittent Could have aspect of asthmatic bronchitis flare  Plan: Z-Pak Prednisone today Close follow-up  in office next month with Dr. Annamaria Boots Continue Xyzal Continue nasal saline rinses Continue Flonase nasal spray  Seasonal and perennial allergic rhinitis ?  Recent sinus CT clear per ENT note Recent evaluation by ENT stable Persistent sinus pressure Clear nasal drainage patient Adequate nasal regimen at this time  Discussion: Patient will need to have close follow-up with our office and further evaluation We will treat as asthmatic bronchitis today with a short course of steroids as well as a Z-Pak.  Patient may benefit from having a more aggressive chronic rhinitis management.  This can be reevaluated at March/2021 office visit with Dr. Annamaria Boots.  Plan: Continue Xyzal Continue nasal saline rinses Course of prednisone today Close follow-up in office in March/2021 with Dr. Annamaria Boots  Dizziness Agree with ENTs recommendations the patient needs to follow-up with cardiology regarding dizziness  Plan: Patient to contact cardiology today reporting no symptoms   Follow Up Instructions:  Return in about 4 weeks (around 12/17/2019) for Follow up with Dr. Annamaria Boots.   I discussed the assessment and treatment plan with the patient. The patient was provided an opportunity to ask questions and all were answered. The patient agreed with the plan and demonstrated an understanding of the instructions.   The patient was advised to call back or seek an in-person evaluation if the symptoms worsen or if the condition fails to improve as anticipated.  I provided 25 minutes of non-face-to-face time during this encounter.   Anne Rinne, NP

## 2019-11-19 NOTE — Telephone Encounter (Signed)
STAT if patient feels like he/she is going to faint   1) Are you dizzy now? No  2) Do you feel faint or have you passed out? No  3) Do you have any other symptoms? Lightheadedness, chest discomfort  4) Have you checked your HR and BP (record if available)? No

## 2019-11-19 NOTE — Telephone Encounter (Signed)
Spoke with pt who states she has had increasing episodes of feeling "lightheaded" x several months.  Pt has been evaluated by an ENT as she thought it might be related to her sinuses.  Pt saw Pulmonology, PA today who suggested she contact Dr Meda Coffee.  Pt states she has appointment for Cardiac CT tomorrow, 11/20/2019.  Pt denies CP, SOB, weakness, dizziness or other symptoms.  Pt reports BP this afternoon is 151/62 with HR of 62.   Pt advised will forward information to Dr Meda Coffee.  Pt advised if she develops any symptoms listed above that were reviewed with her today she will need to go to ED for further evaluation.  Pt verbalizes understanding and agrees with current plan.

## 2019-11-19 NOTE — Telephone Encounter (Signed)
Left message on voicemail with name and callback number Trayce Caravello RN Navigator Cardiac Imaging [facility] [phone] Office [phone] Cell  

## 2019-11-19 NOTE — Assessment & Plan Note (Signed)
Could have aspect of asthmatic bronchitis flare  Plan: Z-Pak Prednisone today Close follow-up in office next month with Dr. Annamaria Boots Continue Xyzal Continue nasal saline rinses Continue Flonase nasal spray

## 2019-11-19 NOTE — Assessment & Plan Note (Signed)
Plan: Continue CPAP 

## 2019-11-19 NOTE — Assessment & Plan Note (Addendum)
?    Recent sinus CT clear per ENT note Recent evaluation by ENT stable Persistent sinus pressure Clear nasal drainage patient Adequate nasal regimen at this time  Discussion: Patient will need to have close follow-up with our office and further evaluation We will treat as asthmatic bronchitis today with a short course of steroids as well as a Z-Pak.  Patient may benefit from having a more aggressive chronic rhinitis management.  This can be reevaluated at March/2021 office visit with Dr. Annamaria Boots.  Plan: Continue Xyzal Continue nasal saline rinses Course of prednisone today Close follow-up in office in March/2021 with Dr. Annamaria Boots

## 2019-11-19 NOTE — Patient Instructions (Addendum)
You were seen today by [name], NP  for:   1. Seasonal and perennial allergic rhinitis  - predniSONE (DELTASONE) 10 MG tablet; Take 2 tablets (20mg  total) daily for the next 5 days. Take in the AM with food.  Dispense: 10 tablet; Refill: 0 - azithromycin (ZITHROMAX) 250 MG tablet; 500mg  (two tablets) today, then 250mg  (1 tablet) for the next 4 days  Dispense: 6 tablet; Refill: 0  2. Obstructive sleep apnea  We recommend that you continue using your CPAP daily >>>Keep up the hard work using your device >>> Goal should be wearing this for the entire night that you are sleeping, at least 4 to 6 hours  Remember:  . Do not drive or operate heavy machinery if tired or drowsy.  . Please notify the supply company and office if you are unable to use your device regularly due to missing supplies or machine being broken.  . Work on maintaining a healthy weight and following your recommended nutrition plan  . Maintain proper daily exercise and movement  . Maintaining proper use of your device can also help improve management of other chronic illnesses such as: Blood pressure, blood sugars, and weight management.   BiPAP/ CPAP Cleaning:  >>>Clean weekly, with Dawn soap, and bottle brush.  Set up to air dry. >>> Wipe mask out daily with wet wipe or towelette  3. Mild intermittent asthma without complication  4. Dizziness  Contact cardiology   We recommend today:   Meds ordered this encounter  Medications  . predniSONE (DELTASONE) 10 MG tablet    Sig: Take 2 tablets (20mg  total) daily for the next 5 days. Take in the AM with food.    Dispense:  10 tablet    Refill:  0  . azithromycin (ZITHROMAX) 250 MG tablet    Sig: 500mg  (two tablets) today, then 250mg  (1 tablet) for the next 4 days    Dispense:  6 tablet    Refill:  0    Follow Up:    Return in about 4 weeks (around 12/17/2019) for Follow up with Dr. Annamaria Boots.   Please do your part to reduce the spread of  COVID-19:      Reduce your risk of any infection  and COVID19 by using the similar precautions used for avoiding the common cold or flu:  Marland Kitchen Wash your hands often with soap and warm water for at least 20 seconds.  If soap and water are not readily available, use an alcohol-based hand sanitizer with at least 60% alcohol.  . If coughing or sneezing, cover your mouth and nose by coughing or sneezing into the elbow areas of your shirt or coat, into a tissue or into your sleeve (not your hands). Langley Gauss A MASK when in public  . Avoid shaking hands with others and consider head nods or verbal greetings only. . Avoid touching your eyes, nose, or mouth with unwashed hands.  . Avoid close contact with people who are sick. . Avoid places or events with large numbers of people in one location, like concerts or sporting events. . If you have some symptoms but not all symptoms, continue to monitor at home and seek medical attention if your symptoms worsen. . If you are having a medical emergency, call 911.   [place] / e-Visit: [email or web address]         [facility]: [facility]: [phone]  [facility]: [phone]     It is flu season:   >>> Best ways to protect herself from the flu: Receive the yearly flu vaccine, practice good hand hygiene washing with soap and also using hand sanitizer when available, eat a nutritious meals, get adequate rest, hydrate appropriately   Please contact the office if your symptoms worsen or you have concerns that you are not improving.   Thank you for choosing [facility] for your healthcare, and for allowing [place] to partner with you on your healthcare journey. I am thankful to be able to provide care to you today.   Wyn Quaker FNP-C

## 2019-11-20 ENCOUNTER — Other Ambulatory Visit: Payer: Self-pay

## 2019-11-20 ENCOUNTER — Ambulatory Visit (HOSPITAL_COMMUNITY)
Admission: RE | Admit: 2019-11-20 | Discharge: 2019-11-20 | Disposition: A | Discharge status: Home / Self Care | Payer: Medicare HMO | Source: Ambulatory Visit | Attending: Cardiology | Admitting: Cardiology

## 2019-11-20 DIAGNOSIS — R0602 Shortness of breath: Secondary | ICD-10-CM | POA: Diagnosis not present

## 2019-11-20 DIAGNOSIS — R072 Precordial pain: Secondary | ICD-10-CM

## 2019-11-20 DIAGNOSIS — I10 Essential (primary) hypertension: Secondary | ICD-10-CM | POA: Diagnosis not present

## 2019-11-20 MED ORDER — NITROGLYCERIN 0.4 MG SL SUBL
SUBLINGUAL_TABLET | SUBLINGUAL | Status: AC
  Filled 2019-11-20: qty 2

## 2019-11-20 MED ORDER — NITROGLYCERIN 0.4 MG SL SUBL
0.8000 mg | SUBLINGUAL_TABLET | Freq: Once | SUBLINGUAL | Status: AC
  Administered 2019-11-20: 0.8 mg via SUBLINGUAL

## 2019-11-20 MED ORDER — IOHEXOL 350 MG/ML SOLN
80.0000 mL | Freq: Once | INTRAVENOUS | Status: AC | PRN
  Administered 2019-11-20: 80 mL via INTRAVENOUS

## 2019-11-20 NOTE — Telephone Encounter (Signed)
I would have her go for the CT and then we will discuss further

## 2019-11-23 NOTE — Telephone Encounter (Signed)
CT CORONARY MORPH W/CTA COR W/SCORE W/CA W/CM &/OR WO/CM (Accession ID:[id number]) (Order [id number]) Imaging Date: 11/20/2019 Department: [facility] CT IMAGING Released By: Trula Slade Authorizing: Dorothy Spark, MD  Exam Status  Status  Final [99]  PACS Intelerad Image Link  Show images for CT CORONARY MORPH W/CTA COR W/SCORE W/CA W/CM &/OR WO/CM  Addendum  ADDENDUM REPORT: 11/20/2019 23:16  CLINICAL DATA:  74 year old female with h/o hypertension, hyperlipidemia and atypical chest pain.  EXAM: Cardiac/Coronary  CTA  TECHNIQUE: The patient was scanned on a [employer].  FINDINGS: A 100 kV prospective scan was triggered in the descending thoracic aorta at 111 HU's. Axial non-contrast 3 mm slices were carried out through the heart. The data set was analyzed on a dedicated work station and scored using the [place]. Gantry rotation speed was 250 msecs and collimation was .6 mm. No beta blockade and 0.8 mg of sl NTG was given. The 3D data set was reconstructed in 5% intervals of the 67-82 % of the R-R cycle. Diastolic phases were analyzed on a dedicated work station using MPR, MIP and VRT modes. The patient received 80 cc of contrast.  Aorta:  Normal size.  Mild diffuse calcifications.  No dissection.  Aortic Valve:  Trileaflet.  No calcifications.  Coronary Arteries:  Normal coronary origin.  Right dominance.  RCA is a large dominant artery that gives rise to PDA and PLA. There is significant motion, however there is only mild mixed plaque in the proximal and mid RCA with 25-49% plaque.  Left main is a large artery that gives rise to LAD and LCX arteries. Left main has no plaque.  LAD is a large vessel that has mild calcified plaque in the proximal and mid LAD with stenosis 0-25%. Distal LAD has no significant plaque.  D1 is a very small branch with no plaque.  LCX is a non-dominant artery that gives rise to two  OM branches, there is minimal plaque.  Other findings:  Normal pulmonary vein drainage into the left atrium.  Normal left atrial appendage without a thrombus.  Normal size of the pulmonary artery.  IMPRESSION: 1. Coronary calcium score of 127. This was 96 percentile for age and sex matched control.  2. Normal coronary origin with right dominance.  3. CAD-RADS 2. Mild non-obstructive CAD (25-49%). Consider non-atherosclerotic causes of chest pain. Consider preventive therapy and risk factor modification.   Electronically Signed   By: Ena Dawley   On: 11/20/2019 23:16   Addended by Dorothy Spark, MD on 11/20/2019 11:19 PM    Study Result  EXAM: OVER-READ INTERPRETATION  CT CHEST  The following report is an over-read performed by radiologist Dr. Vinnie Langton of [facility], [place] on 11/20/2019. This over-read does not include interpretation of cardiac or coronary anatomy or pathology. The coronary calcium score/coronary CTA interpretation by the cardiologist is attached.  COMPARISON:  None.  FINDINGS: Aortic atherosclerosis. There is a cluster of ill-defined peribronchovascular nodules in the medial aspect of the right lower lobe, favored to be infectious or inflammatory in etiology, largest of which measures 9 mm (axial image 17 of series 10). Within the visualized portions of the thorax there are no suspicious appearing pulmonary nodules or masses, there is no acute consolidative airspace disease, no pleural effusions, no pneumothorax and no lymphadenopathy. Visualized portions of the upper abdomen are unremarkable. There are no aggressive appearing lytic or blastic lesions noted in the visualized portions of the skeleton.  IMPRESSION: 1.  Cluster of peribronchovascular nodules in the medial aspect of the right lower lobe with ill-defined margins, likely infectious or inflammatory in etiology. Repeat noncontrast chest CT is  recommended in 2-3 months to ensure the resolution of these findings. 2. Aortic Atherosclerosis (ICD10-I70.0).  Electronically Signed: By: Vinnie Langton M.D. On: 11/20/2019 15:33     Result History  CT CORONARY MORPH W/CTA COR W/SCORE W/CA W/CM &/OR WO/CM (Order [id number]) on 11/20/2019 - Order Result History Report - Result Edited   CT CORONARY MORPH W/CTA COR W/SCORE W/CA W/CM &/OR WO/CM: Result Notes   Dorothy Spark, MD  11/20/2019 11:42 PM EST    She has mild non-obstructive CAD, and elevated LDL, I would start her on rosuvastatin 10 mg po daily. Given her dizziness, she has h/o bradycardia, please arrange for a 2 week Zio patch monitor.  Thank you,  Zollie Pee Documents - 11/20/2019:  Scan on 11/20/2019 12:54 PM by Trula Slade: verbal     Order-Level Documents:  There are no order-level documents. Hospital account-Level Documents:  There are no hospital account-level documents. Orders Requiring a Screening Form  Procedure Order Status Form Status  CT CORONARY MORPH W/CTA COR W/SCORE W/CA W/CM &/OR WO/CM Completed Completed  Vitals  Height Weight BMI (Calculated)      Protocol Documents  Imaging Protocol  Imaging  Imaging Information  Resulted by:  Signed Date/Time  Phone Pager  Etheleen Mayhew 11/20/2019 3:33 PM [phone]   Ena Dawley H 11/20/2019 11:16 PM [id number]   Link to IR Documentation Timeline  Sedation   CT CORONARY MORPH W/CTA COR W/SCORE W/CA W/CM &/OR WO/CM: Result Notes   Dorothy Spark, MD  11/20/2019 11:42 PM EST    She has mild non-obstructive CAD, and elevated LDL, I would start her on rosuvastatin 10 mg po daily. Given her dizziness, she has h/o bradycardia, please arrange for a 2 week Zio patch monitor.  Thank you,  Rosette Reveal Notes   Shann Medal, RT on 11/20/2019 3:06 PM  Meda Coffee to read. GR to over read.  Non-anginal chest pain.  0.8 Nitro    80 ml of omni 350 given.     Imaging Related Medications  Medication  iohexol (OMNIPAQUE) 350 MG/ML injection 80 mL (Completed)  Route: Intravenous  Admin Amount: 80 mL  Volume: 100 mL  PRN Reason(s): contrast  Last Admin Time: 11/20/19 1515  Number of Expected Doses: 1  Most Recent Administration:  User Action Time Recorded Time Dose Route Site Comment Action Reason  Shann Medal, RT 11/20/19 1515 11/20/19 1515 80 mL Intravenous   Contrast Given   Full Administration Report            Original Order  Ordered On Ordered By   10/16/2019 9:18 AM [name], LPN       External Result Report  External Result Report

## 2019-11-23 NOTE — Telephone Encounter (Signed)
Lm to call back ./cy 

## 2019-11-24 ENCOUNTER — Ambulatory Visit: Payer: Medicare HMO | Admitting: Family Medicine

## 2019-11-24 ENCOUNTER — Emergency Department (HOSPITAL_BASED_OUTPATIENT_CLINIC_OR_DEPARTMENT_OTHER)
Admission: EM | Admit: 2019-11-24 | Discharge: 2019-11-24 | Disposition: A | Discharge status: Home / Self Care | Payer: Medicare HMO | Attending: Emergency Medicine | Admitting: Emergency Medicine

## 2019-11-24 ENCOUNTER — Emergency Department (HOSPITAL_BASED_OUTPATIENT_CLINIC_OR_DEPARTMENT_OTHER): Payer: Medicare HMO

## 2019-11-24 ENCOUNTER — Other Ambulatory Visit: Payer: Self-pay

## 2019-11-24 DIAGNOSIS — Z79899 Other long term (current) drug therapy: Secondary | ICD-10-CM | POA: Diagnosis not present

## 2019-11-24 DIAGNOSIS — R030 Elevated blood-pressure reading, without diagnosis of hypertension: Secondary | ICD-10-CM | POA: Diagnosis not present

## 2019-11-24 DIAGNOSIS — R42 Dizziness and giddiness: Secondary | ICD-10-CM | POA: Insufficient documentation

## 2019-11-24 DIAGNOSIS — R519 Headache, unspecified: Secondary | ICD-10-CM | POA: Diagnosis not present

## 2019-11-24 DIAGNOSIS — I10 Essential (primary) hypertension: Secondary | ICD-10-CM | POA: Insufficient documentation

## 2019-11-24 NOTE — ED Notes (Signed)
ED Provider at bedside. 

## 2019-11-24 NOTE — ED Triage Notes (Signed)
Pt reports frontal headache with pain on right side of face and neck/throat pain. Also reports mucus drainage and nausea. PT denies fever. Pt reports PCP advised being checked at urgent care or ED

## 2019-11-24 NOTE — Discharge Instructions (Addendum)
You have been diagnosed today with headache.  At this time there does not appear to be the presence of an emergent medical condition, however there is always the potential for conditions to change. Please read and follow the below instructions.  Please return to the Emergency Department immediately for any new or worsening symptoms. Please be sure to follow up with your Primary Care Provider within one week regarding your visit today; please call their office to schedule an appointment even if you are feeling better for a follow-up visit. You have been referred to [facility] neurology.  Their office should give you a call in the next few days to schedule an appointment.  If you do not hear from their office in the next 2-3 days please call their number attached to your discharge paperwork to schedule an appointment. Please call your cardiologist for a follow-up appointment in the next week for reassessment and discussion of the possible Zio patch. Your blood pressure was elevated in the emergency department today.  Please have your blood pressure rechecked by your primary care provider this week and discuss medication management at that time. Please drink plenty water and get plenty of rest.  Get help right away if: Your headache gets very bad quickly. Your headache gets worse after a lot of physical activity. You keep throwing up. You have a stiff neck. You have trouble seeing. You have trouble speaking. You have pain in the eye or ear. Your muscles are weak or you lose muscle control. You lose your balance or have trouble walking. You feel like you will pass out (faint) or you pass out. You are mixed up (confused). You have a seizure. You have any new/concerning or worsening of symptoms  Please read the additional information packets attached to your discharge summary.  Do not take your medicine if  develop an itchy rash, swelling in your mouth or lips, or difficulty breathing; call 911  and seek immediate emergency medical attention if this occurs.  Note: Portions of this text may have been transcribed using voice recognition software. Every effort was made to ensure accuracy; however, inadvertent computerized transcription errors may still be present.

## 2019-11-24 NOTE — ED Provider Notes (Signed)
[facility] Provider Note   CSN: [id number] Arrival date & time: 11/24/19  [id number]     History Chief Complaint  Patient presents with  . Headache    [facility] is a 74 y.o. female history includes CHF, asthma, Chiari malformation, diverticulosis, GERD, hypertension, OSA, hyperlipidemia.  Patient presents today for right-sided headache that has been intermittent for the past 2 months.  Patient reports that she lost her husband in November 2020 and since that time feels that her health has been declining and she is not been "taking care of herself".  She describes a right frontal headache moderate intensity throbbing no clear aggravating factors, no alleviating factors, occasionally radiating down the right side of her face.  She reports this headache is intermittent she will have it 3-4 times a day, no clear inciting factors.  She feels that she has mucus buildup on the right side of her face which is causing her symptoms.  She reports that she was seen by ENT earlier this year for this problem and was treated for sinus infection with an unknown antibiotic, she finished this antibiotic approximately 1 month ago without relief of her symptoms.  Associated symptoms of occasional lightheadedness and anxiety.  Patient reports that when pain is severe it causes her to feel very anxious this causes her to feel lightheaded and "woozy".  She denies any history of fever/chills, vision changes, fall/injury, neck pain, nausea/vomiting, chest pain/shortness of breath, abdominal pain, numbness/weakness of the extremities or any additional concerns.  Of note patient lives with her adult children.  HPI     Past Medical History:  Diagnosis Date  . Acute combined systolic and diastolic heart failure ([place]) 05/26/2015  . ALLERGIC RHINITIS   . Allergic urticaria 08/01/2014   Patient reports allergies to shrimp, egg whites, tomatoes, dairy   . 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    Office Spirometry 11/07/17-WNL-FVC 1.58/82%, FEV1 1.27/85%, ratio 0.80, FEF 25-75% 0.26/89%  . Bradycardia 02/25/2017  . Chiari malformation    Noted MRI brain 09/2013 - s/p neuro eval for same  . DIVERTICULOSIS, COLON   . Essential hypertension 08/28/2010   Qualifier: Diagnosis of  By: Marca Ancona RMA, Lucy     . Frequent headaches   . GERD   . H/O measles   . H/O mumps   . Hearing loss 01/18/2017  . History of chicken pox   . Hyperglycemia 03/05/2016  . HYPERTENSION   . Hypocalcemia 02/25/2017  . IBS (irritable bowel syndrome) 05/26/2015  . Nonspecific abnormal electrocardiogram (ECG) (EKG)   . OBSTRUCTIVE SLEEP APNEA 12/2008 dx   noncompliant with CPAP qhs  . Obstructive sleep apnea 08/28/2010   NPSG 12/2008:  AHI 13/hr with desats to 78%   . Preventative health care 06/11/2017  . Sciatica    right side  . Seasonal and perennial allergic rhinitis 08/28/2010   Allergy profile 03/11/14- positive especially for dust, cat and dog Food allergy profile- total IgE 86 with several food group elevations Sed rate-WNL     . Sinusitis 06/06/2017  . 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 Vaginitis 01/18/2017  . Vitamin D deficiency 06/11/2017    Patient Active Problem List   Diagnosis Date Noted  . Dizziness 11/19/2019  . Grief reaction 08/27/2019  . Left shoulder pain 08/27/2019  . Hyperlipidemia 07/14/2018  . Asthma, mild intermittent 06/06/2018  . Obesity 01/09/2018  . Vitamin D deficiency 06/11/2017  . Preventative health care 06/11/2017  . Hypocalcemia 02/25/2017  . Bradycardia  02/25/2017  . Hearing loss 01/18/2017  . Hyperglycemia 03/05/2016  . 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  . Acute combined systolic and diastolic heart failure ([place]) 05/26/2015  . Female bladder prolapse 05/26/2015  . IBS (irritable bowel syndrome) 05/26/2015  . History of chicken pox   . Allergic urticaria 08/01/2014  . Food allergy 08/01/2014  . Tinnitus 03/14/2014  . Osteoarthritis   . Nonspecific abnormal  electrocardiogram (ECG) (EKG)   . Headache 10/21/2013  . Chiari malformation   . GERD 11/10/2010  . Obstructive sleep apnea 08/28/2010  . Essential hypertension 08/28/2010  . Seasonal and perennial allergic rhinitis 08/28/2010  . DIVERTICULOSIS, COLON 08/28/2010    Past Surgical History:  Procedure Laterality Date  . ABDOMINAL HYSTERECTOMY  1986  . BREAST SURGERY  1973   Breast biopsy  . UMBILICAL HERNIA REPAIR       OB History   No obstetric history on file.     Family History  Problem Relation Age of Onset  . Arthritis Mother        died of complications from hip surgery  . Arthritis Father   . Kidney disease Father   . Pneumonia Father   . Heart disease Father   . Lupus Daughter   . Rheum arthritis Daughter   . Sjogren's syndrome Daughter   . Fibromyalgia Daughter   . Diabetes Brother   . Heart disease Brother   . Heart attack Brother   . Alcohol abuse Brother   . Throat cancer Brother   . Lung cancer Brother   . Kidney disease Sister        dialysis  . Obesity Sister   . Hypertension Maternal Grandfather   . Hypertension Sister   . Kidney disease Sister        dialysis  . Pneumonia Brother   . Hypertension Daughter   . Graves' disease Daughter   . Hypertension Daughter   . Alcohol abuse Other        parent  . Ataxia Neg Hx   . Chorea Neg Hx   . Dementia Neg Hx   . Mental retardation Neg Hx   . Migraines Neg Hx   . Multiple sclerosis Neg Hx   . Neurofibromatosis Neg Hx   . Neuropathy Neg Hx   . Parkinsonism Neg Hx   . Seizures Neg Hx   . Stroke Neg Hx     Social History   Tobacco Use  . Smoking status: Never Smoker  . Smokeless tobacco: Never Used  . Tobacco comment: Married, lives with spouse, dtr 2 g-kids.  Substance Use Topics  . Alcohol use: No    Comment: rare  . Drug use: No    Home Medications Prior to Admission medications   Medication Sig Start Date End Date Taking? Authorizing Provider  albuterol (PROVENTIL HFA;VENTOLIN  HFA) 108 (90 Base) MCG/ACT inhaler Inhale 2 puffs into the lungs every 6 (six) hours as needed for wheezing or shortness of breath. 12/05/18   Deneise Lever, MD  ALPRAZolam Duanne Moron) 0.5 MG tablet Take 1 tablet (0.5 mg total) by mouth 2 (two) times daily as needed for anxiety or sleep. 08/24/19   Mosie Lukes, MD  amLODipine (NORVASC) 2.5 MG tablet Take 1 tablet by mouth twice daily 11/17/19   Dorothy Spark, MD  amoxicillin (AMOXIL) 500 MG tablet Take 500 mg by mouth 2 (two) times daily. Last dose on 10/16/19    [provider]  azithromycin (ZITHROMAX) 250 MG tablet 500mg  (two  tablets) today, then 250mg  (1 tablet) for the next 4 days 11/19/19   [name], NP  b complex vitamins tablet Take 1 tablet by mouth daily.    [provider]  Cholecalciferol (VITAMIN D) 2000 UNITS CAPS Take by mouth daily.     [provider]  fluticasone (FLONASE) 50 MCG/ACT nasal spray Place 2 sprays into both nostrils daily. 06/19/19   Mosie Lukes, MD  ipratropium (ATROVENT) 0.06 % nasal spray 1-2 puffs each nostril twice daily as needed 12/05/18   Baird Lyons D, MD  levocetirizine (XYZAL) 5 MG tablet Take 1 tablet (5 mg total) by mouth every evening. 09/22/19   Mosie Lukes, MD  losartan (COZAAR) 100 MG tablet Take 1 tablet (100 mg total) by mouth daily. 08/04/19   Mosie Lukes, MD  Multiple Vitamin (MULTIVITAMIN) tablet Take 1 tablet by mouth daily.      [provider]  omeprazole (PRILOSEC) 20 MG capsule Take 1 capsule by mouth once daily 05/20/19   Mosie Lukes, MD  predniSONE (DELTASONE) 10 MG tablet Take 2 tablets (20mg  total) daily for the next 5 days. Take in the AM with food. 11/19/19   [name], NP  spironolactone (ALDACTONE) 25 MG tablet Take 1/2 (one-half) tablet by mouth once daily 10/07/19   Nahser, Wonda Cheng, MD  tiZANidine (ZANAFLEX) 2 MG tablet Take 0.5-2 tablets (1-4 mg total) by mouth 2 (two) times daily as needed for muscle spasms. 08/24/19    Mosie Lukes, MD    Allergies    Codeine, Hydralazine hcl, Lisinopril-hydrochlorothiazide, Metoprolol, Other, Shrimp [shellfish allergy], and Tomato  Review of Systems   Review of Systems Ten systems are reviewed and are negative for acute change except as noted in the HPI  Physical Exam Updated Vital Signs BP (!) 177/79 (BP Location: Right Arm)   Pulse 71   Temp 98 F (36.7 C) (Oral)   Resp 18   Ht 4\' 11"  (1.499 m)   Wt 98.9 kg   SpO2 100%   BMI 44.03 kg/m   Physical Exam Constitutional:      General: She is not in acute distress.    Appearance: Normal appearance. She is well-developed. She is not ill-appearing or diaphoretic.  HENT:     Head: Normocephalic and atraumatic.     Jaw: There is normal jaw occlusion. No trismus.     Comments: No tenderness along the temporal or cervical arteries    Right Ear: Tympanic membrane and external ear normal.     Left Ear: Tympanic membrane and external ear normal.     Nose: Rhinorrhea present. Rhinorrhea is clear.     Right Sinus: Maxillary sinus tenderness present. No frontal sinus tenderness.     Left Sinus: Maxillary sinus tenderness present. No frontal sinus tenderness.     Mouth/Throat:     Mouth: Mucous membranes are moist.     Pharynx: Oropharynx is clear.  Eyes:     General: Vision grossly intact. Gaze aligned appropriately.     Extraocular Movements: Extraocular movements intact.     Conjunctiva/sclera: Conjunctivae normal.     Pupils: Pupils are equal, round, and reactive to light.     Comments: Visual fields grossly intact bilaterally  Neck:     Trachea: Trachea and phonation normal. No tracheal deviation.  Pulmonary:     Effort: Pulmonary effort is normal. No respiratory distress.  Abdominal:     General: There is no distension.  Palpations: Abdomen is soft.     Tenderness: There is no abdominal tenderness. There is no guarding or rebound.  Musculoskeletal:        General: Normal range of motion.      Cervical back: Full passive range of motion without pain, normal range of motion and neck supple.  Lymphadenopathy:     Cervical: No cervical adenopathy.  Skin:    General: Skin is warm and dry.  Neurological:     Mental Status: She is alert.     GCS: GCS eye subscore is 4. GCS verbal subscore is 5. GCS motor subscore is 6.     Comments: Mental Status: Alert, oriented, thought content appropriate, able to give a coherent history. Speech fluent without evidence of aphasia. Able to follow 2 step commands without difficulty. Cranial Nerves: II: Peripheral visual fields grossly normal, pupils equal, round, reactive to light III,IV, VI: ptosis not present, extra-ocular motions intact bilaterally V,VII: smile symmetric, eyebrows raise symmetric, facial light touch sensation equal VIII: hearing grossly normal to voice X: uvula elevates symmetrically XI: bilateral shoulder shrug symmetric and strong XII: midline tongue extension without fassiculations Motor: Normal tone. 5/5 strength in upper and lower extremities bilaterally including strong and equal grip strength and dorsiflexion/plantar flexion Sensory: Sensation intact to light touch in all extremities.Negative Romberg.  Cerebellar: normal finger-to-nose with bilateral upper extremities. Normal heel-to -shin balance bilaterally of the lower extremity. No pronator drift.  Gait: normal gait and balance CV: distal pulses palpable throughout  Psychiatric:        Behavior: Behavior normal.     ED Results / Procedures / Treatments   Labs (all labs ordered are listed, but only abnormal results are displayed) Labs Reviewed - No data to display  EKG EKG Interpretation  Date/Time:  Tuesday November 24 2019 10:26:37 EST Ventricular Rate:  51 PR Interval:    QRS Duration: 105 QT Interval:  449 QTC Calculation: 414 R Axis:   34 Text Interpretation: Sinus rhythm Confirmed by Gerlene Fee [phone]) on 11/24/2019 10:28:44  AM   Radiology CT Head Wo Contrast  Result Date: 11/24/2019 CLINICAL DATA:  Frontal headache. EXAM: CT HEAD WITHOUT CONTRAST TECHNIQUE: Contiguous axial images were obtained from the base of the skull through the vertex without intravenous contrast. COMPARISON:  02/19/2017 FINDINGS: Brain: Stable age related cerebral atrophy, ventriculomegaly and periventricular white matter disease. No extra-axial fluid collections are identified. No CT findings for acute hemispheric infarction or intracranial hemorrhage. No mass lesions. The brainstem and cerebellum are normal. Vascular: Stable vascular calcifications. No aneurysm or hyperdense vessels. Skull: No skull fracture or bone lesion. Sinuses/Orbits: The paranasal sinuses and mastoid air cells are clear. The globes are intact. Other: No scalp lesions or hematoma. IMPRESSION: 1. Stable age related cerebral atrophy, ventriculomegaly and periventricular white matter disease. 2. No acute intracranial findings or mass lesions. Electronically Signed   By: Marijo Sanes M.D.   On: 11/24/2019 10:13    Procedures Procedures (including critical care time)  Medications Ordered in ED Medications - No data to display  ED Course  I have reviewed the triage vital signs and the nursing notes.  Pertinent labs & imaging results that were available during my care of the patient were reviewed by me and considered in my medical decision making (see chart for details).    MDM Rules/Calculators/A&P                     Chart review:  ENT visit 11/04/2019: They addressed  her concerns of dizziness, did not appear to be vertigo.  They advised follow-up with cardiology for evaluation of heart murmur.  A noncontrast CT of the paranasal sinuses appeared clear.  Pulmonology visit 11/19/2019: OSA treated with CPAP.  Mild intermittent asthma questionable bronchitis flare treated with Z-Pak and prednisone.  Dizziness addressed and recommended cardiology follow-up.  Cardiology  patient message and telephone visit 11/19/2019: It appears she had mild nonobstructive CAD and elevated LDL, started on rosuvastatin 10 mg p.o. daily.  They plan for 2 weeks Zio patch monitor.  CT coronary 11/20/2019: IMPRESSION: 1. Coronary calcium score of 127. This was 31 percentile for age and sex matched control.  2. Normal coronary origin with right dominance.  3. CAD-RADS 2. Mild non-obstructive CAD (25-49%). Consider non-atherosclerotic causes of chest pain. Consider preventive therapy and risk factor modification.  CT coronary Overread: IMPRESSION: 1. Cluster of peribronchovascular nodules in the medial aspect of the right lower lobe with ill-defined margins, likely infectious or inflammatory in etiology. Repeat noncontrast chest CT is recommended in 2-3 months to ensure the resolution of these findings. 2. Aortic Atherosclerosis (ICD10-I70.0). ============================================== 74 year old female presents today with chronic headache that has been intermittent but daily for the past 2 to 3 months.  She was referred to cardiology by ENT and pulmonology for follow-up and they apparently working her up and plan to give her a home heart monitor.  On exam she is well-appearing and in no acute distress, vital signs stable, cranial nerves intact, no meningeal signs, neurovascular intact to all 4 extremities, no neuro deficits on exam.  She has mild tenderness over the maxillary sinuses and scant clear rhinorrhea no purulent drainage swelling. Headache free at this time, she was sent in for evaluation by her PCP. Discussed case with Dr. Sedonia Small, will obtain CT head and EKG for evaluation of headache and intermittent dizziness.  - EKG: Sinus rhythm Confirmed by Gerlene Fee [phone]) on 11/24/2019 10:28:44 AM  CT Head: IMPRESSION: 1. Stable age related cerebral atrophy, ventriculomegaly and periventricular white matter disease. 2. No acute intracranial findings or mass lesions.  Electronically Signed   By: Marijo Sanes M.D.   On: 11/24/2019 10:13 - Patient reassessed resting comfortably no acute distress.  She states understanding of findings as above and has no questions.  She has no evidence of meningitis today and history is not consistent with SAH, there is no indication for lumbar puncture at this time.  Additionally she has no tenderness along the facial or cervical arteries to suggest a temporal arteritis.  No known clotting risk factors to suggest a venous sinus thrombosis.  No other red flags to necessitate further ED work-up at this time.  Additionally CT scan shows clear sinuses today, he has no infectious-like symptoms do not feel patient would benefit from additional antibiotics at this time.  Plan of care is ambulatory referral to neurology for follow-up of chronic headaches and for patient to continue following up with her cardiologist for evaluation of intermittent lightheadedness.  EKG performed today does not show any acute ischemic changes, she has no history of chest pain or shortness of breath, no negation for blood work at this time. Patient seen and evaluated by Dr. Sedonia Small during this visit who agrees with discharge and follow-up with neurology.  At this time there does not appear to be any evidence of an acute emergency medical condition and the patient appears stable for discharge with appropriate outpatient follow up. Diagnosis was discussed with patient who verbalizes understanding of  care plan and is agreeable to discharge. I have discussed return precautions with patient who verbalizes understanding of return precautions. Patient encouraged to follow-up with their PCP, Neurology and Cardiology. All questions answered. Patient has been discharged in good condition.  Note: Portions of this report may have been transcribed using voice recognition software. Every effort was made to ensure accuracy; however, inadvertent computerized transcription errors may  still be present. Final Clinical Impression(s) / ED Diagnoses Final diagnoses:  Nonintractable headache, unspecified chronicity pattern, unspecified headache type    Rx / DC Orders ED Discharge Orders         Ordered    Ambulatory referral to Neurology    Comments: An appointment is requested in approximately: As soon as available; 1 week   11/24/19 1035           Gari Crown 11/24/19 1049    Maudie Flakes, MD 11/24/19 1434

## 2019-11-25 ENCOUNTER — Encounter: Payer: Self-pay | Admitting: Neurology

## 2019-11-27 NOTE — Telephone Encounter (Signed)
Will route this message to triage to see if they could try following up with this pt on Monday 3/8 in my absence. Triage see result note in reference to this encounter as well.  Will route both.  Thank!

## 2019-11-27 NOTE — Telephone Encounter (Signed)
Attempted to call the pt back to endorse Coronary CT results and recommendations per Dr. Meda Coffee, and pt did not answer and was unable to leave her a message on her preferred contact number listed.  Also left a message on her daughter Paulette's VM (secondary number listed and on DPR),  to call the office back to receive pts Coronary CT results and recommendations per Dr. Meda Coffee.  2nd attempt the office has tried reaching out to the pt to endorse these results with no success.

## 2019-11-30 ENCOUNTER — Encounter: Payer: Self-pay | Admitting: *Deleted

## 2019-11-30 ENCOUNTER — Other Ambulatory Visit: Payer: Self-pay | Admitting: Internal Medicine

## 2019-11-30 MED ORDER — ROSUVASTATIN CALCIUM 10 MG PO TABS: 10.0000 mg | ORAL_TABLET | Freq: Every day | ORAL | 11 refills | Status: DC

## 2019-11-30 NOTE — Progress Notes (Signed)
Patient ID: [facility], female   DOB: April 10, 1946, 74 y.o.   MRN: [id number] Patient enrolled for [employer] to mail a 14 day cardiac event monitor to her home.

## 2019-11-30 NOTE — Telephone Encounter (Signed)
I spoke with patient and reviewed recommendations on CT results with patient.  Will send prescription for Rosuvastatin to [facility] on [address].  I told patient our monitor department would contact her and monitor would then be mailed to her.

## 2019-12-01 ENCOUNTER — Telehealth: Payer: Self-pay | Admitting: *Deleted

## 2019-12-01 DIAGNOSIS — R9389 Abnormal findings on diagnostic imaging of other specified body structures: Secondary | ICD-10-CM

## 2019-12-01 DIAGNOSIS — J40 Bronchitis, not specified as acute or chronic: Secondary | ICD-10-CM

## 2019-12-01 NOTE — Telephone Encounter (Signed)
Spoke with the pt and informed her of over-read of Coronary CT in the CT of chest portion. Informed her of those results and recommendations per Dr. Meda Coffee, for her to have a repeat Chest CT without contrast to be done in 3 months, for this is most probably findings consistent with bronchitis.  Pt mentioned that she has had frequent bronchitis this year, and goes to see her Pulmonologist next Monday, for follow-up. Pt states she will endorse this to him and let him know that we ordered for he to have a repeat CT Chest W/O contrast to follow-up on this.  Informed the pt that I will place the order for this test in the system and have our [facility] schedulers/CT scheduler Erline Levine, call her back and arrange this image to be done in 3 months. Pt verbalized understanding and agrees with this plan.

## 2019-12-01 NOTE — Telephone Encounter (Signed)
Pts CT Chest WO contrast is scheduled for 01/26/20 at 0830.  Pt made aware of appt date and time by CT scheduler.

## 2019-12-01 NOTE — Telephone Encounter (Signed)
Attempted to call the pt back to endorse recommendations per Dr. Meda Coffee, for the pt to have a repeat CT of Chest in 3 months, for findings noted on over read of her coronary CT, which most probably consistent with bronchitis the pt had recently. Pt did not answer and her VM is not set-up at this time.

## 2019-12-01 NOTE — Addendum Note (Signed)
Addended by: [name] on: 12/01/2019 10:18 AM   Modules accepted: Orders

## 2019-12-01 NOTE — Telephone Encounter (Signed)
-----   Message from Dorothy Spark, MD sent at 12/01/2019  8:49 AM EST ----- Regarding: RE: CT results The findings most probably consistent with a bronchitis, please schedule a repeat chest CT in 3 months, thank you ----- Message ----- From: [name], LPN Sent: [id number]   8:46 AM EST To: Dorothy Spark, MD Subject: FW: CT results                                 [facility] sent this tp me.  She will see [place] in 3 months in the office on 5/26 at 8:20 am so the lab part is not an issue.  She was asking about over read. Let me know what you think?  Thanks, Karlene Einstein  ----- Message ----- From: Thompson Grayer, RN Sent: 11/30/2019   8:32 AM EST To: [name], LPN Subject: CT results                                     Crecencio Mc, I spoke with patient and reviewed recommendations from Dr Meda Coffee with her. Dr Meda Coffee did not comment on the over read by Dr Weber Cooks.  Can you check and see if anything additional needs to be done. Also I sent in the Rosuvastatin prescription.  Dr Meda Coffee did not say anything about follow up lab work.  Does she usually order this? Thanks for checking on these. Fraser Din

## 2019-12-01 NOTE — Telephone Encounter (Signed)
  OVER-READ INTERPRETATION  CT CHEST IMPRESSION: 1. Cluster of peribronchovascular nodules in the medial aspect of the right lower lobe with ill-defined margins, likely infectious or inflammatory in etiology. Repeat noncontrast chest CT is recommended

## 2019-12-02 ENCOUNTER — Other Ambulatory Visit (INDEPENDENT_AMBULATORY_CARE_PROVIDER_SITE_OTHER): Payer: Medicare HMO

## 2019-12-02 DIAGNOSIS — R42 Dizziness and giddiness: Secondary | ICD-10-CM | POA: Diagnosis not present

## 2019-12-02 DIAGNOSIS — R001 Bradycardia, unspecified: Secondary | ICD-10-CM

## 2019-12-07 ENCOUNTER — Other Ambulatory Visit: Payer: Self-pay

## 2019-12-07 ENCOUNTER — Ambulatory Visit: Payer: Medicare HMO | Admitting: Internal Medicine

## 2019-12-07 ENCOUNTER — Encounter: Payer: Self-pay | Admitting: Internal Medicine

## 2019-12-07 DIAGNOSIS — J4520 Mild intermittent asthma, uncomplicated: Secondary | ICD-10-CM | POA: Diagnosis not present

## 2019-12-07 DIAGNOSIS — G4733 Obstructive sleep apnea (adult) (pediatric): Secondary | ICD-10-CM

## 2019-12-07 DIAGNOSIS — J302 Other seasonal allergic rhinitis: Secondary | ICD-10-CM | POA: Diagnosis not present

## 2019-12-07 DIAGNOSIS — J3089 Other allergic rhinitis: Secondary | ICD-10-CM

## 2019-12-07 MED ORDER — OLOPATADINE HCL 0.1 % OP SOLN: 1.0000 [drp] | Freq: Two times a day (BID) | OPHTHALMIC | 12 refills | Status: DC

## 2019-12-07 MED ORDER — ALBUTEROL SULFATE HFA 108 (90 BASE) MCG/ACT IN AERS: INHALATION_SPRAY | RESPIRATORY_TRACT | 12 refills | Status: DC

## 2019-12-07 NOTE — Progress Notes (Signed)
HPI female never smoker followed for rhinitis/allergies, asthma, OSA, CHF, HTN, GERD, IBS, Peanut and cat cause coughing and we advised her to avoid them Allergy profile 03/11/14- positive especially for dust, cat and dog Food allergy profile- total IgE 86 with several food group elevations Sed rate-WNL Office Spirometry 11/07/17-WNL-FVC 1.58/82%, FEV1 1.27/85%, ratio 0.80, FEF 25-75% 0.26/89% HST 12/05/2017-AHI 12.3/hour, desaturation to 82%, body weight 226 pounds ----------------------------------------------------------------------------------------   06/06/2018- 74 year old female never smoker followed for rhinitis/allergies, asthma, OSA HST 12/05/2017-AHI 12.3/hour, desaturation to 82%, body weight 226 pounds CPAP auto 5-20/ [facility] -----OSA: DME [facility]; Pt wears CPAP nightly and DL is attached.  Atrovent 0.06% nasal spray, Zyrtec, albuterol HFA, Doing well since she is restarted CPAP but says she is "still not quite used to it".  Using tape to keep herself from pulling mask off in her sleep.  Does sleep better with it. Download 73% compliance AHI 2.1/hour In the past week has had increased sense of postnasal drainage with little anterior rhinorrhea or sneezing.  Notes "sinus pressure".  Tries to keep diet gluten-free.  Carries Atrovent nasal spray which is some help.  Rescue inhaler is sufficient, used off and on.  Not waking with wheezing.  12/07/19-  74 year old female never smoker followed for rhinitis/allergies, asthma, OSA, CHF, HTN, GERD, IBS, CPAP auto 5-20/ [facility] Download compliance 100%, AHI 2.3/ hr Body weight today  214 lbs [place] in Feb with NP. Had seen Dr Erik Obey ENT for sinus complaints, w CT sinus reported clear. NP treated with Zpak, prednisone. Albuterol hfa, Flonase, Xyzal,  -----f/u OSA/ Asthma mild  Husband died this winter Daughter and grandchildren now living with her. Post nasal drip now makes her queasy. Drinking liquids makes her cough.Using saline  rinse, flonase, xyzal. Orthostatic dizzy if stands too quickly. Being evaluated. Now has rhythm monitor L chest.   ROS-see HPI  + = positive Constitutional:   No-   weight loss, night sweats, fevers, chills, fatigue, lassitude. HEENT:   +headaches, difficulty swallowing, tooth/dental problems, sore throat,       No-  sneezing, itching, ear ache, nasal congestion, +post nasal drip,  CV:  No-   chest pain, orthopnea, PND, swelling in lower extremities, anasarca,  +dizziness, palpitations Resp: +shortness of breath with exertion or at rest.              productive cough,  No non-productive cough,  No- coughing up of blood.              No-   change in color of mucus.  No- wheezing.   Skin: No-   rash or lesions. GI:  No-   heartburn, indigestion, abdominal pain, nausea, vomiting,  GU: . MS:  No-   joint pain or swelling.   Neuro-     nothing unusual Psych:  No- change in mood or affect. No depression or anxiety.  No memory loss.  OBJ- Physical Exam General- Alert, Oriented, Affect-appropriate, Distress- none acute, + obese Skin- rash-none, lesions- none, excoriation- none Lymphadenopathy- none Head- atraumatic            Eyes- Gross vision intact, PERRLA, conjunctivae and secretions clear            Ears- Hearing, canals-normal            Nose- + turbinate edema, no-Septal dev, mucus, polyps, erosion, perforation             Throat- Mallampati III-IV , mucosa clear , drainage- none, tonsils- atrophic, +Dentures Neck- flexible ,  trachea midline, no stridor , thyroid nl, carotid no bruit Chest - symmetrical excursion , unlabored           Heart/CV- RRR , no murmur , no gallop  , no rub, nl s1 s2                           - JVD- none , edema- none, stasis changes- none, varices- none           Lung- clear to P&A, wheeze-none, cough none, dullness-none, rub- none           Chest wall-  Abd- Br/ Gen/ Rectal- Not done, not indicated Extrem- cyanosis- none, clubbing, none, atrophy- none,  strength- nl Neuro- grossly intact to observation

## 2019-12-07 NOTE — Patient Instructions (Addendum)
We can continue CPAP auto 5-20, mask of choice, humidifier, supplies, Airview/ card  Refill script sent for albuterol rescue inhaler  Suggest you try otc nasal spray Nasalcrom/ cromolyn/ cromol Follow the directions on the box. Use it for several days to give it a chance to work.  You can continue flonase/ fluticasone  Script sent for Patanol allergy eyedrops  Be careful to sit for a minute on the edge of your bed. If you stand too quickly it may make you light-headed and dizzy.  Please call if we can help

## 2019-12-08 ENCOUNTER — Telehealth: Payer: Self-pay

## 2019-12-08 ENCOUNTER — Other Ambulatory Visit: Payer: Self-pay | Admitting: Family Medicine

## 2019-12-08 NOTE — Telephone Encounter (Signed)
PA request received from [facility]  Drug requested: Olopatadine HCL 1% solution CMM Key: [id number]  Tried/failed:  Covered alternatives:  PA request has been sent to plan, and a determination is expected within 3 days.   Routing to [place] for follow-up.

## 2019-12-09 ENCOUNTER — Telehealth: Payer: Self-pay | Admitting: Internal Medicine

## 2019-12-09 NOTE — Telephone Encounter (Signed)
SB-These PRN meds were Anne Barnes/C'ed by Pulmonology and unsure as of why/plz advise/thx dmf

## 2019-12-09 NOTE — Telephone Encounter (Signed)
Spoke with patient, I let her know Dr. Annamaria Boots sent in Rx 12/07/19. She was going to call her pharmacy again   Nothing further needed at this time.

## 2019-12-10 NOTE — Telephone Encounter (Signed)
PA request was received from (pharmacy): [facility] Phone: [phone] Medication name and strength: Olopatadine HCl 1% solution Was PA started with CMM?: yes If yes, please enter KEY: [id number] Medication tried and failed: Covered Alternatives:  PA has been approved patient and pharmacy is aware . Nothing else further needed.

## 2019-12-18 NOTE — Telephone Encounter (Signed)
PA request was received from (pharmacy): [facility] [id number] Fax:  Medication name and strength:Olopatadine HCL 1%  Was PA started with CMM?yes If yes, please enter KEY: [id number] Medication tried and failed:  Covered Alternatives:   PA sent to plan, time frame for approval / denial: Approved

## 2019-12-21 ENCOUNTER — Other Ambulatory Visit: Payer: Self-pay

## 2019-12-22 ENCOUNTER — Ambulatory Visit (INDEPENDENT_AMBULATORY_CARE_PROVIDER_SITE_OTHER): Payer: Medicare HMO | Admitting: Family Medicine

## 2019-12-22 ENCOUNTER — Other Ambulatory Visit: Payer: Self-pay

## 2019-12-22 ENCOUNTER — Other Ambulatory Visit: Payer: Self-pay | Admitting: Family Medicine

## 2019-12-22 ENCOUNTER — Encounter: Payer: Self-pay | Admitting: Family Medicine

## 2019-12-22 VITALS — BP 166/59 | HR 64 | Temp 98.6°F | Resp 16 | Ht 59.0 in | Wt 214.4 lb

## 2019-12-22 DIAGNOSIS — E559 Vitamin D deficiency, unspecified: Secondary | ICD-10-CM | POA: Diagnosis not present

## 2019-12-22 DIAGNOSIS — L500 Allergic urticaria: Secondary | ICD-10-CM

## 2019-12-22 DIAGNOSIS — R739 Hyperglycemia, unspecified: Secondary | ICD-10-CM | POA: Diagnosis not present

## 2019-12-22 DIAGNOSIS — I10 Essential (primary) hypertension: Secondary | ICD-10-CM | POA: Diagnosis not present

## 2019-12-22 DIAGNOSIS — E785 Hyperlipidemia, unspecified: Secondary | ICD-10-CM

## 2019-12-22 DIAGNOSIS — T7840XA Allergy, unspecified, initial encounter: Secondary | ICD-10-CM | POA: Insufficient documentation

## 2019-12-22 DIAGNOSIS — T7840XD Allergy, unspecified, subsequent encounter: Secondary | ICD-10-CM

## 2019-12-22 MED ORDER — MONTELUKAST SODIUM 10 MG PO TABS: 10.0000 mg | ORAL_TABLET | Freq: Every day | ORAL | 3 refills | Status: DC

## 2019-12-22 MED ORDER — BUTALBITAL-ACETAMINOPHEN 50-325 MG PO TABS: 1.0000 | ORAL_TABLET | Freq: Three times a day (TID) | ORAL | 0 refills | Status: DC | PRN

## 2019-12-22 NOTE — Assessment & Plan Note (Signed)
Supplement and monitor 

## 2019-12-22 NOTE — Assessment & Plan Note (Signed)
This has improved.

## 2019-12-22 NOTE — Assessment & Plan Note (Signed)
hgba1c acceptable, minimize simple carbs. Increase exercise as tolerated.  

## 2019-12-22 NOTE — Assessment & Plan Note (Signed)
Encouraged heart healthy diet, increase exercise, avoid trans fats, consider a krill oil cap daily 

## 2019-12-22 NOTE — Progress Notes (Signed)
Subjective:    Patient ID: Anne Barnes, female    DOB: 1945-09-30, 74 y.o.   MRN: [id number]  Chief Complaint  Patient presents with  . Hypertension  . Hyperlipidemia    HPI Patient is in today for follow up on chronic medical concerns. She is struggling with increased congestion and allergies. She has clear rhinorrhea and coughs up phlegm that is clear at times. No fevers or chllls. She notes some PND and itchy eyes as well. She has good family support since the loss of husband in t he past year. She brings her bp numbers from home. Generally in the 130s over 60s. She denies any recent febrile illness. Denies CP/palp/SOB/HA/congestion/fevers/GI or GU c/o. Taking meds as prescribed  Past Medical History:  Diagnosis Date  . Acute combined systolic and diastolic heart failure ([place]) 05/26/2015  . ALLERGIC RHINITIS   . Allergic urticaria 08/01/2014   Patient reports allergies to shrimp, egg whites, tomatoes, dairy   . 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   Office Spirometry 11/07/17-WNL-FVC 1.58/82%, FEV1 1.27/85%, ratio 0.80, FEF 25-75% 0.26/89%  . Bradycardia 02/25/2017  . Chiari malformation    Noted MRI brain 09/2013 - s/p neuro eval for same  . DIVERTICULOSIS, COLON   . Essential hypertension 08/28/2010   Qualifier: Diagnosis of  By: Marca Ancona RMA, Lucy     . Frequent headaches   . GERD   . H/O measles   . H/O mumps   . Hearing loss 01/18/2017  . History of chicken pox   . Hyperglycemia 03/05/2016  . HYPERTENSION   . Hypocalcemia 02/25/2017  . IBS (irritable bowel syndrome) 05/26/2015  . Nonspecific abnormal electrocardiogram (ECG) (EKG)   . OBSTRUCTIVE SLEEP APNEA 12/2008 dx   noncompliant with CPAP qhs  . Obstructive sleep apnea 08/28/2010   NPSG 12/2008:  AHI 13/hr with desats to 78%   . Preventative health care 06/11/2017  . Sciatica    right side  . Seasonal and perennial allergic rhinitis 08/28/2010   Allergy profile 03/11/14- positive especially for dust, cat and dog Food allergy  profile- total IgE 86 with several food group elevations Sed rate-WNL     . Sinusitis 06/06/2017  . 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 Vaginitis 01/18/2017  . Vitamin D deficiency 06/11/2017    Past Surgical History:  Procedure Laterality Date  . ABDOMINAL HYSTERECTOMY  1986  . BREAST SURGERY  1973   Breast biopsy  . UMBILICAL HERNIA REPAIR      Family History  Problem Relation Age of Onset  . Arthritis Mother        died of complications from hip surgery  . Arthritis Father   . Kidney disease Father   . Pneumonia Father   . Heart disease Father   . Lupus Daughter   . Rheum arthritis Daughter   . Sjogren's syndrome Daughter   . Fibromyalgia Daughter   . Diabetes Brother   . Heart disease Brother   . Heart attack Brother   . Alcohol abuse Brother   . Throat cancer Brother   . Lung cancer Brother   . Kidney disease Sister        dialysis  . Obesity Sister   . Hypertension Maternal Grandfather   . Hypertension Sister   . Kidney disease Sister        dialysis  . Pneumonia Brother   . Hypertension Daughter   . Graves' disease Daughter   . Hypertension Daughter   . Alcohol abuse Other  parent  . Ataxia Neg Hx   . Chorea Neg Hx   . Dementia Neg Hx   . Mental retardation Neg Hx   . Migraines Neg Hx   . Multiple sclerosis Neg Hx   . Neurofibromatosis Neg Hx   . Neuropathy Neg Hx   . Parkinsonism Neg Hx   . Seizures Neg Hx   . Stroke Neg Hx     Social History   Socioeconomic History  . Marital status: Widowed    Spouse name: Not on file  . Number of children: Not on file  . Years of education: Not on file  . Highest education level: Not on file  Occupational History  . Occupation: retired  Tobacco Use  . Smoking status: Never Smoker  . Smokeless tobacco: Never Used  . Tobacco comment: Married, lives with spouse, dtr 2 g-kids.  Substance and Sexual Activity  . Alcohol use: No    Comment: rare  . Drug use: No  . Sexual activity: Not Currently     Comment: lives with husband, no dietary restrictions avoid foods allergic to, retired  Other Topics Concern  . Not on file  Social History Narrative  . Not on file   Social Determinants of Health   Financial Resource Strain:   . Difficulty of Paying Living Expenses:   Food Insecurity:   . Worried About Charity fundraiser in the Last Year:   . Arboriculturist in the Last Year:   Transportation Needs:   . Film/video editor (Medical):   Marland Kitchen Lack of Transportation (Non-Medical):   Physical Activity:   . Days of Exercise per Week:   . Minutes of Exercise per Session:   Stress:   . Feeling of Stress :   Social Connections:   . Frequency of Communication with Friends and Family:   . Frequency of Social Gatherings with Friends and Family:   . Attends Religious Services:   . Active Member of Clubs or Organizations:   . Attends Archivist Meetings:   Marland Kitchen Marital Status:   Intimate Partner Violence:   . Fear of Current or Ex-Partner:   . Emotionally Abused:   Marland Kitchen Physically Abused:   . Sexually Abused:     Outpatient Medications Prior to Visit  Medication Sig Dispense Refill  . albuterol (VENTOLIN HFA) 108 (90 Base) MCG/ACT inhaler INHALE 2 PUFFS INTO THE LUNGS EVERY 6 HOURS AS NEEDED FOR WHEEZING FOR SHORTNESS OF BREATH 18 g 12  . ALPRAZolam (XANAX) 0.5 MG tablet TAKE 1 TABLET BY MOUTH TWICE DAILY AS NEEDED FOR ANXIETY AND FOR SLEEP 20 tablet 0  . amLODipine (NORVASC) 2.5 MG tablet Take 1 tablet by mouth twice daily 180 tablet 3  . b complex vitamins tablet Take 1 tablet by mouth daily.    . Cholecalciferol (VITAMIN D) 2000 UNITS CAPS Take by mouth daily.     . fluticasone (FLONASE) 50 MCG/ACT nasal spray Place 2 sprays into both nostrils daily. 16 g 6  . levocetirizine (XYZAL) 5 MG tablet Take 1 tablet (5 mg total) by mouth every evening.    Marland Kitchen losartan (COZAAR) 100 MG tablet Take 1 tablet (100 mg total) by mouth daily. 90 tablet 1  . Multiple Vitamin (MULTIVITAMIN)  tablet Take 1 tablet by mouth daily.      Marland Kitchen olopatadine (PATANOL) 0.1 % ophthalmic solution Place 1 drop into both eyes 2 (two) times daily. 5 mL 12  . omeprazole (PRILOSEC) 20 MG capsule Take  1 capsule by mouth once daily 90 capsule 0  . rosuvastatin (CRESTOR) 10 MG tablet Take 1 tablet (10 mg total) by mouth daily. 30 tablet 11  . spironolactone (ALDACTONE) 25 MG tablet Take 1/2 (one-half) tablet by mouth once daily 30 tablet 5  . tiZANidine (ZANAFLEX) 2 MG tablet TAKE 1/2 TO 2 TABLETS BY MOUTH TWICE DAILY AS NEEDED FOR MUSCLE SPASM 30 tablet 0   No facility-administered medications prior to visit.    Allergies  Allergen Reactions  . Codeine Nausea And Vomiting  . Hydralazine Hcl Other (See Comments)    Pt reports causes dizziness  . Lisinopril-Hydrochlorothiazide Hives    REACTION: lip swelling,facial swelling,rash.  . Metoprolol Other (See Comments)    Does not feel well on it   . Other Cough    Patient states she is allergic to Shrimp, egg whites, tomatoes and dairy products.  Says they make her feel unwell but do not cause hives, SOB or anaphylaxis.  She just avoids eating them because they can make her have a headache.  Strawberries, watermelon, peanuts, and all shellfish.    . Shrimp [Shellfish Allergy] Other (See Comments)    Was allergy tested and showed shrimp was one--has not reacted b/c avoided  . Tomato Other (See Comments)    Review of Systems  Constitutional: Negative for fever and malaise/fatigue.  HENT: Negative for congestion.   Eyes: Negative for blurred vision.  Respiratory: Negative for shortness of breath.   Cardiovascular: Negative for chest pain, palpitations and leg swelling.  Gastrointestinal: Negative for abdominal pain, blood in stool and nausea.  Genitourinary: Negative for dysuria and frequency.  Musculoskeletal: Negative for falls.  Skin: Negative for rash.  Neurological: Negative for dizziness, loss of consciousness and headaches.    Endo/Heme/Allergies: Negative for environmental allergies.  Psychiatric/Behavioral: Negative for depression. The patient is not nervous/anxious.        Objective:    Physical Exam Vitals and nursing note reviewed.  Constitutional:      General: She is not in acute distress.    Appearance: She is well-developed.  HENT:     Head: Normocephalic and atraumatic.     Nose: Nose normal.  Eyes:     General:        Right eye: No discharge.        Left eye: No discharge.  Cardiovascular:     Rate and Rhythm: Normal rate and regular rhythm.     Heart sounds: No murmur.  Pulmonary:     Effort: Pulmonary effort is normal.     Breath sounds: Normal breath sounds.  Abdominal:     General: Bowel sounds are normal.     Palpations: Abdomen is soft.     Tenderness: There is no abdominal tenderness.  Musculoskeletal:     Cervical back: Normal range of motion and neck supple.  Skin:    General: Skin is warm and dry.  Neurological:     Mental Status: She is alert and oriented to person, place, and time.     There were no vitals taken for this visit. Wt Readings from Last 3 Encounters:  12/07/19 214 lb (97.1 kg)  11/24/19 218 lb (98.9 kg)  10/16/19 210 lb (95.3 kg)    Diabetic Foot Exam - Simple   No data filed     Lab Results  Component Value Date   WBC 7.4 09/03/2019   HGB 12.9 09/03/2019   HCT 39.4 09/03/2019   PLT 255.0 09/03/2019   GLUCOSE  94 11/09/2019   CHOL 159 09/03/2019   TRIG 37.0 09/03/2019   HDL 38.70 (L) 09/03/2019   LDLCALC 113 (H) 09/03/2019   ALT 13 09/03/2019   AST 13 09/03/2019   NA 141 11/09/2019   K 4.5 11/09/2019   CL 102 11/09/2019   CREATININE 0.88 11/09/2019   BUN 17 11/09/2019   CO2 26 11/09/2019   TSH 1.48 09/03/2019   INR 1.06 01/10/2015   HGBA1C 5.4 09/03/2019    Lab Results  Component Value Date   TSH 1.48 09/03/2019   Lab Results  Component Value Date   WBC 7.4 09/03/2019   HGB 12.9 09/03/2019   HCT 39.4 09/03/2019   MCV 90.6  09/03/2019   PLT 255.0 09/03/2019   Lab Results  Component Value Date   NA 141 11/09/2019   K 4.5 11/09/2019   CO2 26 11/09/2019   GLUCOSE 94 11/09/2019   BUN 17 11/09/2019   CREATININE 0.88 11/09/2019   BILITOT 0.4 09/03/2019   ALKPHOS 74 09/03/2019   AST 13 09/03/2019   ALT 13 09/03/2019   PROT 6.9 09/03/2019   ALBUMIN 3.9 09/03/2019   CALCIUM 9.2 11/09/2019   ANIONGAP 9 09/15/2017   GFR 85.32 09/03/2019   Lab Results  Component Value Date   CHOL 159 09/03/2019   Lab Results  Component Value Date   HDL 38.70 (L) 09/03/2019   Lab Results  Component Value Date   LDLCALC 113 (H) 09/03/2019   Lab Results  Component Value Date   TRIG 37.0 09/03/2019   Lab Results  Component Value Date   CHOLHDL 4 09/03/2019   Lab Results  Component Value Date   HGBA1C 5.4 09/03/2019       Assessment & Plan:   Problem List Items Addressed This Visit    Essential hypertension    Well controlled, no changes to meds. Encouraged heart healthy diet such as the DASH diet and exercise as tolerated.       Hyperglycemia    hgba1c acceptable, minimize simple carbs. Increase exercise as tolerated.       Vitamin D deficiency    Supplement and monitor      Hyperlipidemia    Encouraged heart healthy diet, increase exercise, avoid trans fats, consider a krill oil cap daily         I am having Anne Barnes maintain her multivitamin, Vitamin D, b complex vitamins, omeprazole, fluticasone, losartan, levocetirizine, spironolactone, amLODipine, rosuvastatin, albuterol, olopatadine, ALPRAZolam, and tiZANidine.  No orders of the defined types were placed in this encounter.    Penni Homans, MD

## 2019-12-22 NOTE — Patient Instructions (Signed)

## 2019-12-22 NOTE — Assessment & Plan Note (Addendum)
no changes to meds. Encouraged heart healthy diet such as the DASH diet and exercise as tolerated.  

## 2019-12-28 DIAGNOSIS — R001 Bradycardia, unspecified: Secondary | ICD-10-CM | POA: Diagnosis not present

## 2019-12-28 DIAGNOSIS — R42 Dizziness and giddiness: Secondary | ICD-10-CM | POA: Diagnosis not present

## 2019-12-30 ENCOUNTER — Encounter: Payer: Self-pay | Admitting: Internal Medicine

## 2019-12-30 NOTE — Assessment & Plan Note (Signed)
Discussed options, especially now in pollen season Plan- try Nasalcrom nasal spray

## 2019-12-30 NOTE — Assessment & Plan Note (Signed)
Controlled now Plan- refill inhaler with discussion

## 2019-12-30 NOTE — Assessment & Plan Note (Signed)
Benefits with good compliance and control Plan- continue CPAP 5-20

## 2020-01-12 NOTE — Progress Notes (Signed)
NEUROLOGY CONSULTATION NOTE  OLER TUSZYNSKI MRN: [id number] DOB: Feb 22, 1946  Referring provider: [name], PA-C Primary care provider: Penni Homans, MD  Reason for consult:  Headache and dizziness  HISTORY OF PRESENT ILLNESS: Anne Barnes is a 74 year old right-handed woman with history of OSA, chronic sinusitis, and hypertension who presents for headache and dizziness.  She has had headaches for several years, which she attributes to chronic sinusitis.  I saw her back in late 2014.  At that time, she  described as 10/10 bifrontal and left temporal pounding headache lasting several seconds and triggered by coughing or sneezing but not usually by bending over.  She had an underlying constant aching that occurred almost daily.  She also had left sided neck pain radiating up to behind her left ear but not into the arm.  No associated visual disturbance, nausea, vomiting, photophobia, phonophobia, or dizziness.  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 She deferred evaluation by neurosurgery.  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  She was on indomethacin for a time with variable efficacy.  She was last seen in 2015.  For ongoing headache and neck pain, she underwent CT head and cervical spine on 02/19/2017 which was personally reviewed and again demonstrated Chiari with tonsillar ectopia 13 mm below foramen magnum and mild degenerative changes at C2-3 and C3-4 on the left and C4-5 and C5-T2.  However, she also demonstrated ventriculomegaly as well.  Symptoms particularly got worse since her husband passed away in 09/02/2019.  In January 2020, she started experiencing severe right sided headaches as well.  They are infrequent, maybe occurring once a month.  She continues to have a dull daily headache.  She wakes  up in the morning with it and it resolves by the afternoon. Neck pain is not too much of an issue.  Sometimes it feels still which she attributes to sleeping in a recliner.  No associated visual disturbance, nausea, vomiting, numbness or weakness.  She reports increased dizziness and anxiety.  Dizziness is described as a lightheadedness, rarely with spinning, and occurs only when she is standing and moving about.  It does not occur while sitting or at rest.  Sometimes she feels like she may pass out but that has not happened.  She also reports worsening hearing and tinnitus in her right ear.  No double vision or trouble swallowing.  She was evaluated by ENT.  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  Hearing aids were recommended.  CT of paranasal sinuses were clear.  She was advised to see cardiology for evaluation of heart murmur.  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  14 day Zio patch demonstrated sinus bradycardia to sinus tachycardia (39 bpm to 167 bpm with average of 57 bpm) and very short episodes of SVT but no pauses or atrial fibrillation.  She is treated for OSA with CPAP.  Pulmonology treated her for bronchitis.  She was seen in the ED on 11/24/2019 for headache.  CT head personally reviewed showed stable age-related atrophy and ventriculomegaly but without apparent crowding of the structures in the posterior fossa and no acute abnormality.  She has continued to experience rhinorrhea, cough and nasal congestion related to allergies.    Fioricet -  Twice a day about 5 days  Tizanidine every other week sleeps in recliner  PAST MEDICAL HISTORY: Past Medical History:  Diagnosis Date  . Acute combined systolic and diastolic heart failure ([place]) 05/26/2015  . ALLERGIC RHINITIS   . Allergic urticaria 08/01/2014   Patient reports allergies to shrimp, egg whites, tomatoes, dairy   . 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   Office Spirometry 11/07/17-WNL-FVC 1.58/82%, FEV1  1.27/85%, ratio 0.80, FEF 25-75% 0.26/89%  . Bradycardia 02/25/2017  . Chiari malformation    Noted MRI brain 09/2013 - s/p neuro eval for same  . DIVERTICULOSIS, COLON   . Essential hypertension 08/28/2010   Qualifier: Diagnosis of  By: Marca Ancona RMA, Lucy     . Frequent headaches   . GERD   . H/O measles   . H/O mumps   . Hearing loss 01/18/2017  . History of chicken pox   . Hyperglycemia 03/05/2016  . HYPERTENSION   . Hypocalcemia 02/25/2017  . IBS (irritable bowel syndrome) 05/26/2015  . Nonspecific abnormal electrocardiogram (ECG) (EKG)   . OBSTRUCTIVE SLEEP APNEA 12/2008 dx   noncompliant with CPAP qhs  . Obstructive sleep apnea 08/28/2010   NPSG 12/2008:  AHI 13/hr with desats to 78%   . Preventative health care 06/11/2017  . Sciatica    right side  . Seasonal and perennial allergic rhinitis 08/28/2010   Allergy profile 03/11/14- positive especially for dust, cat and dog Food allergy profile- total IgE 86 with several food group elevations Sed rate-WNL     . Sinusitis 06/06/2017  . 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 Vaginitis 01/18/2017  . Vitamin D deficiency 06/11/2017    PAST SURGICAL HISTORY: Past Surgical History:  Procedure Laterality Date  . ABDOMINAL HYSTERECTOMY  1986  . BREAST SURGERY  1973   Breast biopsy  . UMBILICAL HERNIA REPAIR      MEDICATIONS: Current Outpatient Medications on File Prior to Visit  Medication Sig Dispense Refill  . albuterol (VENTOLIN HFA) 108 (90 Base) MCG/ACT inhaler INHALE 2 PUFFS INTO THE LUNGS EVERY 6 HOURS AS NEEDED FOR WHEEZING FOR SHORTNESS OF BREATH 18 g 12  . ALPRAZolam (XANAX) 0.5 MG tablet TAKE 1 TABLET BY MOUTH TWICE DAILY AS NEEDED FOR ANXIETY AND FOR SLEEP 20 tablet 0  . amLODipine (NORVASC) 2.5 MG tablet Take 1 tablet by mouth twice daily 180 tablet 3  . b complex vitamins tablet Take 1 tablet by mouth daily.    . butalbital-acetaminophen-caffeine (FIORICET) 50-325-40 MG tablet TAKE 1 TABLET BY MOUTH THREE TIMES DAILY AS NEEDED 40  tablet 0  . Cholecalciferol (VITAMIN D) 2000 UNITS CAPS Take by mouth daily.     . fluticasone (FLONASE) 50 MCG/ACT nasal spray Place 2 sprays into both nostrils daily. 16 g 6  . levocetirizine (XYZAL) 5 MG tablet Take 1 tablet (5 mg total) by mouth every evening.    Marland Kitchen losartan (COZAAR) 100 MG tablet Take 1 tablet (100 mg total) by mouth daily. 90 tablet 1  . montelukast (SINGULAIR) 10 MG tablet Take 1 tablet (10 mg total) by mouth at bedtime. 30 tablet 3  . Multiple Vitamin (MULTIVITAMIN) tablet Take 1 tablet by mouth daily.      Marland Kitchen olopatadine (PATANOL) 0.1 % ophthalmic solution Place 1 drop into both eyes 2 (two) times daily. 5 mL 12  . omeprazole (PRILOSEC) 20 MG capsule Take 1 capsule by mouth once daily 90 capsule 0  . rosuvastatin (CRESTOR) 10 MG tablet Take 1 tablet (10 mg total) by mouth daily. 30 tablet 11  . spironolactone (ALDACTONE) 25 MG tablet Take 1/2 (one-half) tablet  by mouth once daily 30 tablet 5  . tiZANidine (ZANAFLEX) 2 MG tablet TAKE 1/2 TO 2 TABLETS BY MOUTH TWICE DAILY AS NEEDED FOR MUSCLE SPASM 30 tablet 0   No current facility-administered medications on file prior to visit.    ALLERGIES: Allergies  Allergen Reactions  . Codeine Nausea And Vomiting  . Hydralazine Hcl Other (See Comments)    Pt reports causes dizziness  . Lisinopril-Hydrochlorothiazide Hives    REACTION: lip swelling,facial swelling,rash.  . Metoprolol Other (See Comments)    Does not feel well on it   . Other Cough    Patient states she is allergic to Shrimp, egg whites, tomatoes and dairy products.  Says they make her feel unwell but do not cause hives, SOB or anaphylaxis.  She just avoids eating them because they can make her have a headache.  Strawberries, watermelon, peanuts, and all shellfish.    . Shrimp [Shellfish Allergy] Other (See Comments)    Was allergy tested and showed shrimp was one--has not reacted b/c avoided  . Tomato Other (See Comments)    FAMILY HISTORY: Family History   Problem Relation Age of Onset  . Arthritis Mother        died of complications from hip surgery  . Arthritis Father   . Kidney disease Father   . Pneumonia Father   . Heart disease Father   . Lupus Daughter   . Rheum arthritis Daughter   . Sjogren's syndrome Daughter   . Fibromyalgia Daughter   . Diabetes Brother   . Heart disease Brother   . Heart attack Brother   . Alcohol abuse Brother   . Throat cancer Brother   . Lung cancer Brother   . Kidney disease Sister        dialysis  . Obesity Sister   . Hypertension Maternal Grandfather   . Hypertension Sister   . Kidney disease Sister        dialysis  . Pneumonia Brother   . Hypertension Daughter   . Graves' disease Daughter   . Hypertension Daughter   . Alcohol abuse Other        parent  . Ataxia Neg Hx   . Chorea Neg Hx   . Dementia Neg Hx   . Mental retardation Neg Hx   . Migraines Neg Hx   . Multiple sclerosis Neg Hx   . Neurofibromatosis Neg Hx   . Neuropathy Neg Hx   . Parkinsonism Neg Hx   . Seizures Neg Hx   . Stroke Neg Hx    SOCIAL HISTORY: Social History   Socioeconomic History  . Marital status: Widowed    Spouse name: Not on file  . Number of children: Not on file  . Years of education: Not on file  . Highest education level: Not on file  Occupational History  . Occupation: retired  Tobacco Use  . Smoking status: Never Smoker  . Smokeless tobacco: Never Used  . Tobacco comment: Married, lives with spouse, dtr 2 g-kids.  Substance and Sexual Activity  . Alcohol use: No    Comment: rare  . Drug use: No  . Sexual activity: Not Currently    Comment: lives with husband, no dietary restrictions avoid foods allergic to, retired  Other Topics Concern  . Not on file  Social History Narrative  . Not on file   Social Determinants of Health   Financial Resource Strain:   . Difficulty of Paying Living Expenses:   Food Insecurity:   .  Worried About Charity fundraiser in the Last Year:   . Arts development officer in the Last Year:   Transportation Needs:   . Film/video editor (Medical):   Marland Kitchen Lack of Transportation (Non-Medical):   Physical Activity:   . Days of Exercise per Week:   . Minutes of Exercise per Session:   Stress:   . Feeling of Stress :   Social Connections:   . Frequency of Communication with Friends and Family:   . Frequency of Social Gatherings with Friends and Family:   . Attends Religious Services:   . Active Member of Clubs or Organizations:   . Attends Archivist Meetings:   Marland Kitchen Marital Status:   Intimate Partner Violence:   . Fear of Current or Ex-Partner:   . Emotionally Abused:   Marland Kitchen Physically Abused:   . Sexually Abused:      PHYSICAL EXAM: Blood pressure (!) 174/80, pulse 60, height 4\' 11"  (1.499 m), weight 215 lb 12.8 oz (97.9 kg), SpO2 97 %. Orthostatics:  Supine BP 142/80 HR73; Sitting BP150/84 HR64; Standing BP154/70 HR62 General: No acute distress.  Patient appears well-groomed.   Head:  Normocephalic/atraumatic Eyes:  fundi examined but not visualized Neck: supple, no paraspinal tenderness, full range of motion Back: No paraspinal tenderness Heart: regular rate and rhythm Lungs: Clear to auscultation bilaterally. Vascular: No carotid bruits. Neurological Exam: Mental status: alert and oriented to person, place, and time, recent and remote memory intact, fund of knowledge intact, attention and concentration intact, speech fluent and not dysarthric, language intact. Cranial nerves: CN I: not tested CN II: pupils equal, round and reactive to light, visual fields intact CN III, IV, VI:  full range of motion, no nystagmus, no ptosis CN V: facial sensation intact CN VII: upper and lower face symmetric CN VIII: reduced hearing in right ear CN IX, X: gag intact, uvula midline CN XI: sternocleidomastoid and trapezius muscles intact CN XII: tongue midline Bulk & Tone: normal, no fasciculations. Motor:  5/5 throughout  Sensation:   Pinprick and vibration sensation intact.   Deep Tendon Reflexes:  2+ throughout, toes downgoing.   Finger to nose testing:  Without dysmetria.   Heel to shin:  Without dysmetria.   Gait:  Mildly wide-based.  Able to turn.  Romberg with mild sway.  IMPRESSION: 1.  Chronic daily headache 2.  Cough headache 3.  Chiari 1 malformation 4.  Dizziness 5.  Cerebral ventriculomegaly 6.  OSA  Cough headache definitely can be associated with Chiari malformation.  Etiology of the chronic daily morning headaches is less certain.  Morning headaches may be related to sleep apnea.  It may be related to frequent analgesic use or may be sequelae of other medical comorbidities.  Her dizziness is vague and not specifically consistent with a primary neurologic etiology.  She describes a lightheadedness.  Recent CT head and prior CT head from 2018 show ventriculomegaly which was not appreciated on brain MRI from 2014. Unclear if it could be related to her Chiari but no obvious crowding of structures in the posterior fossa noted.   PLAN: 1.  Will order MRI of brain to evaluate Chiari malformation. 2.  Will start topiramate 25mg  at bedtime, which helps with various forms of chronic headache, including cough headaches.  We can increase dose to 50mg  at bedtime in 4 weeks if needed. 3.  Advised to follow up with pulmonology to see if CPAP settings need to be rechecked to ensure that OSA  is adequately controlled. 4.  Advised to stop Fioricet given high propensity for rebound headache. 5.  Limit use of pain relievers to no more than 2 days out of week to prevent risk of rebound or medication-overuse headache. 6.  Follow up in 4 months.  Thank you for allowing me to take part in the care of this patient.  Metta Clines, DO  CC: Penni Homans, MD

## 2020-01-13 ENCOUNTER — Other Ambulatory Visit: Payer: Self-pay

## 2020-01-13 ENCOUNTER — Encounter: Payer: Self-pay | Admitting: Neurology

## 2020-01-13 ENCOUNTER — Ambulatory Visit: Payer: Medicare HMO | Admitting: Neurology

## 2020-01-13 VITALS — BP 174/80 | HR 60 | Ht 59.0 in | Wt 215.8 lb

## 2020-01-13 DIAGNOSIS — H9191 Unspecified hearing loss, right ear: Secondary | ICD-10-CM

## 2020-01-13 DIAGNOSIS — G935 Compression of brain: Secondary | ICD-10-CM | POA: Diagnosis not present

## 2020-01-13 DIAGNOSIS — R519 Headache, unspecified: Secondary | ICD-10-CM

## 2020-01-13 DIAGNOSIS — H9311 Tinnitus, right ear: Secondary | ICD-10-CM | POA: Diagnosis not present

## 2020-01-13 DIAGNOSIS — R42 Dizziness and giddiness: Secondary | ICD-10-CM

## 2020-01-13 DIAGNOSIS — G9389 Other specified disorders of brain: Secondary | ICD-10-CM | POA: Diagnosis not present

## 2020-01-13 MED ORDER — TOPIRAMATE 25 MG PO TABS: 25.0000 mg | ORAL_TABLET | Freq: Every day | ORAL | 3 refills | Status: DC

## 2020-01-13 NOTE — Patient Instructions (Signed)
1.  To treat headache, start topiramate 25mg  at bedtime.  We can increase dose in 4 weeks if needed. 2.  Stop the butalbital-acetaminophen-caffeine pill 3.  Limit use of pain relievers to no more than 2 days out of week to prevent risk of rebound or medication-overuse headache. 4.  We will check MRI of brain without contrast. 5.  Follow up with pulmonology to see if CPAP settings need to be rechecked. 6.  Follow up in 4 months.

## 2020-01-15 ENCOUNTER — Telehealth: Payer: Self-pay | Admitting: Internal Medicine

## 2020-01-15 ENCOUNTER — Telehealth: Payer: Self-pay | Admitting: Cardiology

## 2020-01-15 DIAGNOSIS — G4733 Obstructive sleep apnea (adult) (pediatric): Secondary | ICD-10-CM

## 2020-01-15 NOTE — Telephone Encounter (Signed)
Patient is calling is regards to Topiramate medication prescribed by her PCP for headaches. She states prior to consuming she would like to ensure that she is eligible to take medication due to her heart condition. Please return call to advise.

## 2020-01-15 NOTE — Telephone Encounter (Signed)
I spoke to the patient and informed her that Dr Meda Coffee approved the start of Topiramate.  She verbalized understanding.

## 2020-01-15 NOTE — Telephone Encounter (Signed)
She can take it

## 2020-01-15 NOTE — Telephone Encounter (Signed)
ATC pt, there was no answer and I could not leave a message due to her VM not being set up. Will try back.

## 2020-01-15 NOTE — Telephone Encounter (Signed)
New Message    Pt is calling back  She says she was on the phone with someone and then she got another call from someone from our office and she is returning the call   She apologizes for the confusion and says she knows how valuable time is      Please call back

## 2020-01-15 NOTE — Telephone Encounter (Signed)
I spoke to the patient and her PCP would like to start her on Topiramate for her headaches, but she would like Dr Francesca Oman approval to do so, because of her heart hx.

## 2020-01-18 ENCOUNTER — Other Ambulatory Visit: Payer: Self-pay

## 2020-01-18 MED ORDER — ROSUVASTATIN CALCIUM 10 MG PO TABS: 10.0000 mg | ORAL_TABLET | Freq: Every day | ORAL | 2 refills | Status: DC

## 2020-01-18 NOTE — Telephone Encounter (Signed)
ATC pt, there was no answer and I could not leave a message due to her VM not being set up. Will try back.

## 2020-01-19 NOTE — Telephone Encounter (Signed)
Spoke with pt. She is aware of Dr. Janee Morn recommendation. Order has been placed for CPAP pressure change. Nothing further was needed.

## 2020-01-19 NOTE — Telephone Encounter (Signed)
Please order [facility]- reduce auto CPAP rage to 5-15 and please send download in 2 weeks. Hope this helps the headaches.

## 2020-01-19 NOTE — Telephone Encounter (Signed)
Dr. Annamaria Boots please advise on message from patient:  Patient has been waking up with headaches. Would like to know if CPAP settings need to checked. Went to Neurology doctor and they suggested getting it checked. Patient phone number is [phone].

## 2020-01-26 ENCOUNTER — Ambulatory Visit (INDEPENDENT_AMBULATORY_CARE_PROVIDER_SITE_OTHER)
Admission: RE | Admit: 2020-01-26 | Discharge: 2020-01-26 | Disposition: A | Discharge status: Home / Self Care | Payer: Medicare HMO | Source: Ambulatory Visit | Attending: Cardiology | Admitting: Cardiology

## 2020-01-26 ENCOUNTER — Other Ambulatory Visit: Payer: Self-pay

## 2020-01-26 DIAGNOSIS — R9389 Abnormal findings on diagnostic imaging of other specified body structures: Secondary | ICD-10-CM

## 2020-01-26 DIAGNOSIS — J40 Bronchitis, not specified as acute or chronic: Secondary | ICD-10-CM | POA: Diagnosis not present

## 2020-01-26 DIAGNOSIS — G4733 Obstructive sleep apnea (adult) (pediatric): Secondary | ICD-10-CM | POA: Diagnosis not present

## 2020-01-26 DIAGNOSIS — R918 Other nonspecific abnormal finding of lung field: Secondary | ICD-10-CM | POA: Diagnosis not present

## 2020-01-27 ENCOUNTER — Ambulatory Visit
Admission: RE | Admit: 2020-01-27 | Discharge: 2020-01-27 | Disposition: A | Discharge status: Home / Self Care | Payer: Medicare HMO | Source: Ambulatory Visit | Attending: Neurology | Admitting: Neurology

## 2020-01-27 DIAGNOSIS — R519 Headache, unspecified: Secondary | ICD-10-CM

## 2020-01-27 DIAGNOSIS — R42 Dizziness and giddiness: Secondary | ICD-10-CM

## 2020-01-28 ENCOUNTER — Ambulatory Visit: Payer: Medicare HMO | Admitting: Allergy

## 2020-01-28 ENCOUNTER — Encounter: Payer: Self-pay | Admitting: Allergy

## 2020-01-28 ENCOUNTER — Other Ambulatory Visit: Payer: Self-pay

## 2020-01-28 VITALS — BP 150/70 | HR 73 | Temp 97.8°F | Resp 18 | Ht <= 58 in | Wt 214.2 lb

## 2020-01-28 DIAGNOSIS — T7800XA Anaphylactic reaction due to unspecified food, initial encounter: Secondary | ICD-10-CM

## 2020-01-28 DIAGNOSIS — J328 Other chronic sinusitis: Secondary | ICD-10-CM

## 2020-01-28 DIAGNOSIS — J4520 Mild intermittent asthma, uncomplicated: Secondary | ICD-10-CM

## 2020-01-28 DIAGNOSIS — J3089 Other allergic rhinitis: Secondary | ICD-10-CM

## 2020-01-28 MED ORDER — LEVOCETIRIZINE DIHYDROCHLORIDE 5 MG PO TABS: 5.0000 mg | ORAL_TABLET | Freq: Every evening | ORAL | 3 refills | Status: DC

## 2020-01-28 MED ORDER — XHANCE 93 MCG/ACT NA EXHU: 2.0000 | INHALANT_SUSPENSION | Freq: Two times a day (BID) | NASAL | 3 refills | Status: DC

## 2020-01-28 MED ORDER — AZELASTINE HCL 0.1 % NA SOLN: 2.0000 | Freq: Two times a day (BID) | NASAL | 5 refills | Status: DC

## 2020-01-28 MED ORDER — MONTELUKAST SODIUM 10 MG PO TABS: 10.0000 mg | ORAL_TABLET | Freq: Every day | ORAL | 3 refills | Status: DC

## 2020-01-28 NOTE — Patient Instructions (Addendum)
-   environmental allergy skin testing is positive to dust mites, cat and dog.   - allergen avoidance measures discussed/handouts provided - continue Xyzel 5mg  daily  - continue Singulair 10mg  daily - for better control of nasal/sinus symptoms of congestion, sinus pressure, headaches recommend use of Xhance nasal spray device.  Truett Perna is a device that allows for deeper deposition of the medication to your sinuses for better control of symptoms.  Xhance contains fluticasone however it is at a higher dosage than what you can get over-the-counter.  Use Xhance 2 sprays each nostril twice a day at this time  - for nasal drainage and post-nasal drip recommend use of nasal antihistamine, Astelin 2 sprays each nostril 1-2 times a day as needed.  This nasal spray is different than Xhance.   - if medication management is ineffective can consider allergen immunotherapy (allergy shots) which is a long-term therapy that takes 3-5 years to re-train the body to tolerate exposure to what you are allergic to in the environment.   - continue food avoidance of tomatoes, shellfish, dairy, gluten, strawberry, honeydew, watermelon, soybean, peanut, egg.  These were foods positive on previous testing from 2015.  - skin testing to these foods were negative today.  Will obtain repeat IgE levels via bloodwork to see if you are eligible to perform in-office challenges to any foods - would recommend having access to a epinephrine device.   - follow emergency action plan in case of allergic reaction   - have access to albuterol inhaler 2 puffs every 4-6 hours as needed for cough/wheeze/shortness of breath/chest tightness.  May use 15-20 minutes prior to activity.   Monitor frequency of use.    Follow-up 3-4 months or sooner if needed

## 2020-01-28 NOTE — Progress Notes (Signed)
New Patient Note  RE: Anne Barnes MRN: [id number] DOB: 28-May-1946 Date of Office Visit: 01/28/2020  Referring provider: Mosie Lukes, MD Primary care provider: Mosie Lukes, MD  Chief Complaint: allergies  History of present illness: Anne Barnes is a 74 y.o. female presenting today for consultation for allergies.  She used to see Dr. Annamaria Boots who she thought was allergist but he is a pulmonologist by training.  She has continued to have allergy symptoms that are causing her a lot of issues. She states she gets a lot of pain in the pressure in her sinuses especially in her cheeks and forehead.  She also has a lots of headaches for which she is seeing a neurologist.  She states she had an MRI just yesterday of her head for evaluation of her headaches.  She also reports decreased hearing and ringing in the ears for which she is seeing ENT. She also has a large component of postnasal drainage.  She states the drainage gets hung up in her throat and causes her to cough.   She uses Flonase which she states has not helped.   She takes Xyzal and Singulair nightly which she states has helped decrease amount of mucus; she has been on these medications just for the past couple of months.  She has had courses of antibiotics which she states does nothing.  She does not believe she has had any steroid injections.  She states she has had prednisone that she feels did not help much.  Symptoms ongoing off and on for years.  However the past 8-9 months symptoms have been constant.    She states her lungs have never been an issue.  She does have reported asthma.  However she denies wheezing, chest tightness or shortness of breath.  She states her albuterol inhaler does help to loosen the mucus in her throat.   No history of eczema.   She does report food allergy.  She states the foods were tested via blood work with environmental testing in 2015 by Dr. Annamaria Boots.  She did have several positive foods  that she has not been avoiding due to this.  She states prior she had not noticed any foods causing her any symptoms. However since the testing she states she has noticed that lobster makes her throat scratchy and cough.  Dairy she states years ago when she was using creamer in her coffee what caused her to be nauseated and throw up.  Several foods make her throat itch and cough including watermelon, strawberry, honeydew.  She has been avoiding these foods as well as tomatoes, shrimp, gluten and she limits meat in the diet.  She does have OSA for which she uses a CPAP.  She will continue to follow with Dr. Annamaria Boots for this.  Review of systems: Review of Systems  Constitutional: Positive for chills.  HENT:       See HPI  Eyes: Negative.   Respiratory: Positive for cough.   Cardiovascular: Negative.   Gastrointestinal: Negative.   Musculoskeletal: Negative.   Skin: Negative.   Neurological:       See HPI    All other systems negative unless noted above in HPI  Past medical history: Past Medical History:  Diagnosis Date  . Acute combined systolic and diastolic heart failure ([place]) 05/26/2015  . ALLERGIC RHINITIS   . Allergic urticaria 08/01/2014   Patient reports allergies to shrimp, egg whites, tomatoes, dairy   . Asthma, mild persistent  11/23/2015   Office Spirometry 11/07/17-WNL-FVC 1.58/82%, FEV1 1.27/85%, ratio 0.80, FEF 25-75% 0.26/89%  . Bradycardia 02/25/2017  . Chiari malformation    Noted MRI brain 09/2013 - s/p neuro eval for same  . DIVERTICULOSIS, COLON   . Essential hypertension 08/28/2010   Qualifier: Diagnosis of  By: Marca Ancona RMA, Lucy     . Frequent headaches   . GERD   . H/O measles   . H/O mumps   . Hearing loss 01/18/2017  . History of chicken pox   . Hyperglycemia 03/05/2016  . HYPERTENSION   . Hypocalcemia 02/25/2017  . IBS (irritable bowel syndrome) 05/26/2015  . Nonspecific abnormal electrocardiogram (ECG) (EKG)   . OBSTRUCTIVE SLEEP APNEA 12/2008 dx   noncompliant  with CPAP qhs  . Obstructive sleep apnea 08/28/2010   NPSG 12/2008:  AHI 13/hr with desats to 78%   . Preventative health care 06/11/2017  . Sciatica    right side  . Seasonal and perennial allergic rhinitis 08/28/2010   Allergy profile 03/11/14- positive especially for dust, cat and dog Food allergy profile- total IgE 86 with several food group elevations Sed rate-WNL     . Sinusitis 06/06/2017  . 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 Vaginitis 01/18/2017  . Vitamin D deficiency 06/11/2017    Past surgical history: Past Surgical History:  Procedure Laterality Date  . ABDOMINAL HYSTERECTOMY  1986  . BREAST SURGERY  1973   Breast biopsy  . UMBILICAL HERNIA REPAIR      Family history:  Family History  Problem Relation Age of Onset  . Arthritis Mother        died of complications from hip surgery  . Arthritis Father   . Kidney disease Father   . Pneumonia Father   . Heart disease Father   . Lupus Daughter   . Rheum arthritis Daughter   . Sjogren's syndrome Daughter   . Fibromyalgia Daughter   . Diabetes Brother   . Heart disease Brother   . Heart attack Brother   . Alcohol abuse Brother   . Throat cancer Brother   . Lung cancer Brother   . Kidney disease Sister        dialysis  . Obesity Sister   . Hypertension Maternal Grandfather   . Hypertension Sister   . Kidney disease Sister        dialysis  . Pneumonia Brother   . Hypertension Daughter   . Graves' disease Daughter   . Hypertension Daughter   . Alcohol abuse Other        parent  . Ataxia Neg Hx   . Chorea Neg Hx   . Dementia Neg Hx   . Mental retardation Neg Hx   . Migraines Neg Hx   . Multiple sclerosis Neg Hx   . Neurofibromatosis Neg Hx   . Neuropathy Neg Hx   . Parkinsonism Neg Hx   . Seizures Neg Hx   . Stroke Neg Hx     Social history: She lives in a home with gas heating. She has no carpeting, dusts often and plans to take down her curtains.  No pets in the home.  No concern for roaches in the  home.  There is concern for water damage or mildew.  She is retired.  Denies any smoking history.  Medication List: Current Outpatient Medications  Medication Sig Dispense Refill  . albuterol (VENTOLIN HFA) 108 (90 Base) MCG/ACT inhaler INHALE 2 PUFFS INTO THE LUNGS EVERY 6 HOURS AS NEEDED  FOR WHEEZING FOR SHORTNESS OF BREATH 18 g 12  . ALPRAZolam (XANAX) 0.5 MG tablet TAKE 1 TABLET BY MOUTH TWICE DAILY AS NEEDED FOR ANXIETY AND FOR SLEEP 20 tablet 0  . amLODipine (NORVASC) 2.5 MG tablet Take 1 tablet by mouth twice daily 180 tablet 3  . b complex vitamins tablet Take 1 tablet by mouth daily.    . butalbital-acetaminophen-caffeine (FIORICET) 50-325-40 MG tablet TAKE 1 TABLET BY MOUTH THREE TIMES DAILY AS NEEDED 40 tablet 0  . Cholecalciferol (VITAMIN D) 2000 UNITS CAPS Take by mouth daily.     . fluticasone (FLONASE) 50 MCG/ACT nasal spray Place 2 sprays into both nostrils daily. 16 g 6  . levocetirizine (XYZAL) 5 MG tablet Take 1 tablet (5 mg total) by mouth every evening.    Marland Kitchen losartan (COZAAR) 100 MG tablet Take 1 tablet (100 mg total) by mouth daily. 90 tablet 1  . montelukast (SINGULAIR) 10 MG tablet Take 1 tablet (10 mg total) by mouth at bedtime. 30 tablet 3  . Multiple Vitamin (MULTIVITAMIN) tablet Take 1 tablet by mouth daily.      Marland Kitchen olopatadine (PATANOL) 0.1 % ophthalmic solution Place 1 drop into both eyes 2 (two) times daily. 5 mL 12  . omeprazole (PRILOSEC) 20 MG capsule Take 1 capsule by mouth once daily 90 capsule 0  . rosuvastatin (CRESTOR) 10 MG tablet Take 1 tablet (10 mg total) by mouth daily. 90 tablet 2  . spironolactone (ALDACTONE) 25 MG tablet Take 1/2 (one-half) tablet by mouth once daily 30 tablet 5  . tiZANidine (ZANAFLEX) 2 MG tablet TAKE 1/2 TO 2 TABLETS BY MOUTH TWICE DAILY AS NEEDED FOR MUSCLE SPASM 30 tablet 0  . topiramate (TOPAMAX) 25 MG tablet Take 1 tablet (25 mg total) by mouth at bedtime. 30 tablet 3   No current facility-administered medications for this  visit.    Known medication allergies: Allergies  Allergen Reactions  . Codeine Nausea And Vomiting  . Hydralazine Hcl Other (See Comments)    Pt reports causes dizziness  . Lisinopril-Hydrochlorothiazide Hives    REACTION: lip swelling,facial swelling,rash.  . Metoprolol Other (See Comments)    Does not feel well on it   . Other Cough    Patient states she is allergic to Shrimp, egg whites, tomatoes and dairy products.  Says they make her feel unwell but do not cause hives, SOB or anaphylaxis.  She just avoids eating them because they can make her have a headache.  Strawberries, watermelon, peanuts, and all shellfish.    . Shrimp [Shellfish Allergy] Other (See Comments)    Was allergy tested and showed shrimp was one--has not reacted b/c avoided  . Tomato Other (See Comments)     Physical examination: Blood pressure (!) 150/70, pulse 73, temperature 97.8 F (36.6 C), temperature source Temporal, resp. rate 18, height 4\' 10"  (1.473 m), weight 214 lb 3.2 oz (97.2 kg), SpO2 97 %.  General: Alert, interactive, in no acute distress. HEENT: PERRLA, TMs pearly gray, turbinates moderately edematous without discharge, post-pharynx non erythematous. Neck: Supple without lymphadenopathy. Lungs: Clear to auscultation without wheezing, rhonchi or rales. {no increased work of breathing. CV: Normal S1, S2 without murmurs. Abdomen: Nondistended, nontender. Skin: Warm and dry, without lesions or rashes. Extremities:  No clubbing, cyanosis or edema. Neuro:   Grossly intact.  Diagnositics/Labs: Labs:  Component     Latest Ref Rng & Units 03/11/2014  Allergen, D pternoyssinus,d7     kU/L 3.24 (H)  D. farinae  kU/L 6.18 (H)  Cat Dander     kU/L 3.57 (H)  Dog Dander     kU/L 14.00 (H)  Allergen,Goose feathers, e70     kU/L <0.10  [place] Grass     kU/L <0.10  Fescue     kU/L <0.10  G005 Rye, Perennial     kU/L <0.10  Timothy Grass     kU/L <0.10  G009 Red Top     kU/L <0.10    [place] Grass     kU/L <0.10  House Dust [place]     kU/L 8.59 (H)  Aspergillus fumigatus, m3     kU/L <0.10  Alternaria Alternata     kU/L <0.10  Helminthosporium halodes     kU/L <0.10  Stemphylium Botryosum     kU/L <0.10  Candida albicans     kU/L 0.46 (H)  Curvularia lunata     kU/L <0.10  [place] IgE     kU/L <0.10  Oak     kU/L <0.10  Elm IgE     kU/L 0.10 (H)  Sycamore Tree     kU/L <0.10  Common Ragweed     kU/L <0.10  Plantain     kU/L <0.10  Lamb's Quarters     kU/L 0.13 (H)  Goldenrod     kU/L <0.10  Egg White IgE     kU/L 0.54 (H)  Milk IgE     kU/L 1.09 (H)  Fish Cod     kU/L <0.10  Wheat IgE     kU/L 1.22 (H)  Peanut IgE     kU/L 0.37 (H)  Soybean IgE     kU/L 0.40 (H)  Corn     kU/L <0.10  Tomato IgE     kU/L 0.17 (H)  Orange     kU/L <0.10  Chicken IgE     kU/L <0.10  Apple     kU/L <0.10  Shrimp IgE     kU/L 0.39 (H)  Tuna IgE     kU/L <0.10  IgE (Immunoglobulin E), Serum     0.0 - 180.0 IU/mL 86.0   Allergy testing: Environmental allergy skin prick testing is positive to dust mites, cat and dog.   Select food allergy skin prick testing is negative tomatoes, shellfish panel, milk, wheat, strawberry, cantaloupe, watermelon, soybean, peanut and egg. Allergy testing results were read and interpreted by provider, documented by clinical staff.   Assessment and plan:   Allergic rhinitis Chronic rhinosinusitis - environmental allergy skin testing is positive to dust mites, cat and dog.   - allergen avoidance measures discussed/handouts provided - continue Xyzel 5mg  daily  - continue Singulair 10mg  daily - for better control of nasal/sinus symptoms of congestion, sinus pressure, headaches recommend use of Xhance nasal spray device.  Truett Perna is a device that allows for deeper deposition of the medication to your sinuses for better control of symptoms.  Xhance contains fluticasone however it is at a higher dosage than what you can  get over-the-counter.  Use Xhance 2 sprays each nostril twice a day at this time  - for nasal drainage and post-nasal drip recommend use of nasal antihistamine, Astelin 2 sprays each nostril 1-2 times a day as needed.  This nasal spray is different than Xhance.   - if medication management is ineffective can consider allergen immunotherapy (allergy shots) which is a long-term therapy that takes 3-5 years to re-train the body to tolerate exposure to what you are allergic to in  the environment.   Anaphylaxis due to food - continue food avoidance of tomatoes, shellfish, dairy, gluten, strawberry, honeydew, watermelon, soybean, peanut, egg.  These were foods positive on previous testing from 2015.  - skin testing to these foods were negative today.  Will obtain repeat IgE levels via bloodwork to see if you are eligible to perform in-office challenges to any foods - would recommend having access to a epinephrine device.   - follow emergency action plan in case of allergic reaction   Mild intermittent asthma - have access to albuterol inhaler 2 puffs every 4-6 hours as needed for cough/wheeze/shortness of breath/chest tightness.  May use 15-20 minutes prior to activity.   Monitor frequency of use.    Follow-up 3-4 months or sooner if needed  I appreciate the opportunity to take part in [place] care. Please do not hesitate to contact me with questions.  Sincerely,   Prudy Feeler, MD Allergy/Immunology [facility]

## 2020-02-01 ENCOUNTER — Telehealth: Payer: Self-pay

## 2020-02-01 NOTE — Telephone Encounter (Signed)
Prior authorization for Truett Perna has been submitted on covermymeds.

## 2020-02-02 LAB — ALLERGEN PROFILE, SHELLFISH
Clam IgE: <0.1 kU/L
F023-IgE Crab: <0.1 kU/L
F080-IgE Lobster: <0.1 kU/L
F290-IgE Oyster: <0.1 kU/L
Scallop IgE: <0.1 kU/L
Shrimp IgE: 0.69 kU/L — AB

## 2020-02-02 LAB — ALLERGEN SOYBEAN: Soybean IgE: 0.22 kU/L — AB

## 2020-02-02 LAB — ALLERGEN, PEANUT F13: Peanut IgE: 0.95 kU/L — AB

## 2020-02-02 LAB — ALLERGEN, TOMATO F25: Allergen Tomato, IgE: <0.1 kU/L

## 2020-02-02 LAB — ALLERGEN, STRAWBERRY, F44: Allergen Strawberry IgE: <0.1 kU/L

## 2020-02-02 LAB — ALLERGEN, WHEAT, F4: Wheat IgE: 1 kU/L — AB

## 2020-02-02 LAB — ALLERGEN WATERMELON: Allergen Watermelon IgE: <0.1 kU/L

## 2020-02-02 NOTE — Telephone Encounter (Signed)
Approved, sent to pharmacy and scan center.

## 2020-02-03 ENCOUNTER — Telehealth: Payer: Self-pay

## 2020-02-03 NOTE — Telephone Encounter (Signed)
See my chart message

## 2020-02-05 ENCOUNTER — Telehealth: Payer: Self-pay | Admitting: *Deleted

## 2020-02-05 NOTE — Telephone Encounter (Signed)
Called and left a voicemail asking for patient to return call to discuss.  °

## 2020-02-05 NOTE — Telephone Encounter (Signed)
Patient called back and was advised of options. She verbalized understanding and will have someone come out to her home to check for mold.

## 2020-02-05 NOTE — Telephone Encounter (Signed)
Patient called back and stated that she thinks that she has mold in her house and she may be allergic to. She states that when she is in her house she starts to feel light headed and woozy but when she goes outside after while she feels fine. She wonders if with the rain she is having an accumulation under her house and mold may be growing. She states that her last doctor tested her for mold but she can't remember the results and he never pressed anything about mold so she is not sure. She is wondering if you can test her for mold. Please advise.

## 2020-02-05 NOTE — Telephone Encounter (Signed)
Please let her know that we did test mold in our mold panel on skin testing.  All molds on skin testing was negative.  If she would like we can order the serum IgE for the mold panel to ensure she is indeed negative.   However if she is concerned for mold in the home it can still be an irritant as mold spores can be in the air.  She should get any mold remedied if there is an issue in the home.Anne Barnes

## 2020-02-17 ENCOUNTER — Encounter: Payer: Self-pay | Admitting: Cardiology

## 2020-02-17 ENCOUNTER — Ambulatory Visit: Payer: Medicare HMO | Admitting: Cardiology

## 2020-02-17 ENCOUNTER — Other Ambulatory Visit: Payer: Self-pay

## 2020-02-17 VITALS — BP 148/80 | HR 74 | Ht <= 58 in | Wt 211.0 lb

## 2020-02-17 DIAGNOSIS — I5041 Acute combined systolic (congestive) and diastolic (congestive) heart failure: Secondary | ICD-10-CM | POA: Diagnosis not present

## 2020-02-17 DIAGNOSIS — I10 Essential (primary) hypertension: Secondary | ICD-10-CM

## 2020-02-17 DIAGNOSIS — I5033 Acute on chronic diastolic (congestive) heart failure: Secondary | ICD-10-CM | POA: Diagnosis not present

## 2020-02-17 DIAGNOSIS — E785 Hyperlipidemia, unspecified: Secondary | ICD-10-CM

## 2020-02-17 NOTE — Progress Notes (Signed)
2

## 2020-02-17 NOTE — Patient Instructions (Signed)
Medication Instructions:   Your physician recommends that you continue on your current medications as directed. Please refer to the Current Medication list given to you today.  *If you need a refill on your cardiac medications before your next appointment, please call your pharmacy*   Lab Work:  TODAY--CMET, CBC, LIPIDS, AND PRO-BNP  If you have labs (blood work) drawn today and your tests are completely normal, you will receive your results only by: Marland Kitchen MyChart Message (if you have MyChart) OR . A paper copy in the mail If you have any lab test that is abnormal or we need to change your treatment, we will call you to review the results.  Follow-Up: At [facility], you and your health needs are our priority.  As part of our continuing mission to provide you with exceptional heart care, we have created designated Provider Care Teams.  These Care Teams include your primary Cardiologist (physician) and Advanced Practice Providers (APPs -  Physician Assistants and Nurse Practitioners) who all work together to provide you with the care you need, when you need it.  We recommend signing up for the patient portal called "MyChart".  Sign up information is provided on this After Visit Summary.  MyChart is used to connect with patients for Virtual Visits (Telemedicine).  Patients are able to view lab/test results, encounter notes, upcoming appointments, etc.  Non-urgent messages can be sent to your provider as well.   To learn more about what you can do with MyChart, go to [email or web address].    Your next appointment:   6 month(s)  The format for your next appointment:   In Person  Provider:   Ena Dawley, MD

## 2020-02-17 NOTE — Progress Notes (Signed)
Cardiology Office Note:    Date:  02/17/2020   ID:  Anne Barnes, DOB October 13, 1945, MRN [id number]  PCP:  Mosie Lukes, MD  Cardiologist:  Ena Dawley, MD  Electrophysiologist:  None   Referring MD: Mosie Lukes, MD   Reason for visit: 3 months follow-up, dizziness  History of Present Illness:    Anne Barnes is a 74 y.o. female with a hx of bradycardia down to low 50s while on beta blocker. The patient was asymptomatic at the time and states that she has always had slow heart rate. Pt with hx of HTN, hyperglycemia, mild HLD, osteoarthritis, tinnitus, IBS, OSA intolerant to CPAP and headaches thought to be allergies.  Dr. Annamaria Boots follows her sleep apnea. Echo done 02/2017 EF was 60-65% no RWMA,  No significant valvular abnormalities. She has been using low-dose of spironolactone for lower extremity edema that has been stable over last couple years. She has been very careful with her diet and walks with her daughter in a park several times a week.  She was seen in January 2021 for concern of chest pains, a coronary CTA was performed that showed mild nonobstructive CAD and also findings of peribronchovascular nodules in the medial aspect of the right lower lobe with ill-defined margins, likely infectious or inflammatory in etiology.  Repeat CT scan on Jan 26, 2020 showed that those resolved and it confirmed inflammatory etiology.  She was started on Crestor 10 mg daily that she tolerates well.  The patient states today that she has been doing great, she has mild dizziness, she is worried she might be anemic and is due for colonoscopy.  She has started to walk and exercise daily, her family is very encouraging and involved in the exercise with her.  She denies any chest pain or shortness of breath.  She gets occasional lower extremity edema that resolves when she takes a whole pill of spironolactone.  Her blood pressure at home runs in 130s.  No falls.  Past Medical History:  Diagnosis  Date  . Acute combined systolic and diastolic heart failure ([place]) 05/26/2015  . ALLERGIC RHINITIS   . Allergic urticaria 08/01/2014   Patient reports allergies to shrimp, egg whites, tomatoes, dairy   . 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   Office Spirometry 11/07/17-WNL-FVC 1.58/82%, FEV1 1.27/85%, ratio 0.80, FEF 25-75% 0.26/89%  . Bradycardia 02/25/2017  . Chiari malformation    Noted MRI brain 09/2013 - s/p neuro eval for same  . DIVERTICULOSIS, COLON   . Essential hypertension 08/28/2010   Qualifier: Diagnosis of  By: Marca Ancona RMA, Lucy     . Frequent headaches   . GERD   . H/O measles   . H/O mumps   . Hearing loss 01/18/2017  . History of chicken pox   . Hyperglycemia 03/05/2016  . HYPERTENSION   . Hypocalcemia 02/25/2017  . IBS (irritable bowel syndrome) 05/26/2015  . Nonspecific abnormal electrocardiogram (ECG) (EKG)   . OBSTRUCTIVE SLEEP APNEA 12/2008 dx   noncompliant with CPAP qhs  . Obstructive sleep apnea 08/28/2010   NPSG 12/2008:  AHI 13/hr with desats to 78%   . Preventative health care 06/11/2017  . Sciatica    right side  . Seasonal and perennial allergic rhinitis 08/28/2010   Allergy profile 03/11/14- positive especially for dust, cat and dog Food allergy profile- total IgE 86 with several food group elevations Sed rate-WNL     . Sinusitis 06/06/2017  . 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 Vaginitis  01/18/2017  . Vitamin D deficiency 06/11/2017    Past Surgical History:  Procedure Laterality Date  . ABDOMINAL HYSTERECTOMY  1986  . BREAST SURGERY  1973   Breast biopsy  . UMBILICAL HERNIA REPAIR      Current Medications: Current Meds  Medication Sig  . albuterol (VENTOLIN HFA) 108 (90 Base) MCG/ACT inhaler INHALE 2 PUFFS INTO THE LUNGS EVERY 6 HOURS AS NEEDED FOR WHEEZING FOR SHORTNESS OF BREATH  . ALPRAZolam (XANAX) 0.5 MG tablet TAKE 1 TABLET BY MOUTH TWICE DAILY AS NEEDED FOR ANXIETY AND FOR SLEEP  . amLODipine (NORVASC) 2.5 MG tablet Take 1 tablet by mouth twice daily  .  azelastine (ASTELIN) 0.1 % nasal spray Place 2 sprays into both nostrils 2 (two) times daily.  Marland Kitchen b complex vitamins tablet Take 1 tablet by mouth daily.  . Cholecalciferol (VITAMIN D) 2000 UNITS CAPS Take by mouth daily.   . fluticasone (FLONASE) 50 MCG/ACT nasal spray Place 2 sprays into both nostrils daily.  . Fluticasone Propionate (XHANCE) 93 MCG/ACT EXHU Place 2 sprays into the nose in the morning and at bedtime.  Marland Kitchen levocetirizine (XYZAL) 5 MG tablet Take 1 tablet (5 mg total) by mouth every evening.  Marland Kitchen losartan (COZAAR) 100 MG tablet Take 1 tablet (100 mg total) by mouth daily.  . montelukast (SINGULAIR) 10 MG tablet Take 1 tablet (10 mg total) by mouth at bedtime.  . Multiple Vitamin (MULTIVITAMIN) tablet Take 1 tablet by mouth daily.    Marland Kitchen olopatadine (PATANOL) 0.1 % ophthalmic solution Place 1 drop into both eyes 2 (two) times daily.  Marland Kitchen omeprazole (PRILOSEC) 20 MG capsule Take 1 capsule by mouth once daily  . rosuvastatin (CRESTOR) 10 MG tablet Take 1 tablet (10 mg total) by mouth daily.  Marland Kitchen spironolactone (ALDACTONE) 25 MG tablet Take 1/2 (one-half) tablet by mouth once daily  . tiZANidine (ZANAFLEX) 2 MG tablet TAKE 1/2 TO 2 TABLETS BY MOUTH TWICE DAILY AS NEEDED FOR MUSCLE SPASM  . topiramate (TOPAMAX) 25 MG tablet Take 1 tablet (25 mg total) by mouth at bedtime.     Allergies:   Codeine, Hydralazine hcl, Lisinopril-hydrochlorothiazide, Metoprolol, Other, Shrimp [shellfish allergy], and Tomato   Social History   Socioeconomic History  . Marital status: Widowed    Spouse name: Not on file  . Number of children: Not on file  . Years of education: Not on file  . Highest education level: Not on file  Occupational History  . Occupation: retired  Tobacco Use  . Smoking status: Never Smoker  . Smokeless tobacco: Never Used  . Tobacco comment: Married, lives with spouse, dtr 2 g-kids.  Substance and Sexual Activity  . Alcohol use: No    Comment: rare  . Drug use: No  . Sexual  activity: Not Currently    Comment: lives with husband, no dietary restrictions avoid foods allergic to, retired  Other Topics Concern  . Not on file  Social History Narrative   Right handed   One story with daughter and two grandkids   Social Determinants of Health   Financial Resource Strain:   . Difficulty of Paying Living Expenses:   Food Insecurity:   . Worried About Charity fundraiser in the Last Year:   . Arboriculturist in the Last Year:   Transportation Needs:   . Film/video editor (Medical):   Marland Kitchen Lack of Transportation (Non-Medical):   Physical Activity:   . Days of Exercise per Week:   .  Minutes of Exercise per Session:   Stress:   . Feeling of Stress :   Social Connections:   . Frequency of Communication with Friends and Family:   . Frequency of Social Gatherings with Friends and Family:   . Attends Religious Services:   . Active Member of Clubs or Organizations:   . Attends Archivist Meetings:   Marland Kitchen Marital Status:      Family History: The patient's family history includes Alcohol abuse in her brother and another family member; Arthritis in her father and mother; Diabetes in her brother; Fibromyalgia in her daughter; Berenice Primas' disease in her daughter; Heart attack in her brother; Heart disease in her brother and father; Hypertension in her daughter, daughter, maternal grandfather, and sister; Kidney disease in her father, sister, and sister; Lung cancer in her brother; Lupus in her daughter; Obesity in her sister; Pneumonia in her brother and father; Rheum arthritis in her daughter; Sjogren's syndrome in her daughter; Throat cancer in her brother. There is no history of Ataxia, Chorea, Dementia, Mental retardation, Migraines, Multiple sclerosis, Neurofibromatosis, Neuropathy, Parkinsonism, Seizures, or Stroke.  ROS:   Please see the history of present illness.    All other systems reviewed and are negative.  EKGs/Labs/Other Studies Reviewed:    The  following studies were reviewed today:  EKG:  EKG is ordered today.  The ekg ordered today demonstrates sinus rhythm, normal EKG unchanged from prior.  Recent Labs: 02/17/2019: NT-Pro BNP 58 09/03/2019: ALT 13; Hemoglobin 12.9; Platelets 255.0; TSH 1.48 11/09/2019: BUN 17; Creatinine, Ser 0.88; Potassium 4.5; Sodium 141  Recent Lipid Panel    Component Value Date/Time   CHOL 159 09/03/2019 0952   TRIG 37.0 09/03/2019 0952   TRIG 55 12/30/2009 0000   HDL 38.70 (L) 09/03/2019 0952   CHOLHDL 4 09/03/2019 0952   VLDL 7.4 09/03/2019 0952   LDLCALC 113 (H) 09/03/2019 0952    Physical Exam:    VS:  BP (!) 148/80   Pulse 74   Ht _0  (1.473 m)   Wt 211 lb (95.7 kg)   SpO2 96%   BMI 44.10 kg/m     Wt Readings from Last 3 Encounters:  02/17/20 211 lb (95.7 kg)  01/28/20 214 lb 3.2 oz (97.2 kg)  01/13/20 215 lb 12.8 oz (97.9 kg)     GEN: Well nourished, well developed in no acute distress HEENT: Normal NECK: No JVD; No carotid bruits LYMPHATICS: No lymphadenopathy CARDIAC: RRR, no murmurs, rubs, gallops RESPIRATORY:  Clear to auscultation without rales, wheezing or rhonchi  ABDOMEN: Soft, non-tender, non-distended MUSCULOSKELETAL:  No edema; No deformity  SKIN: Warm and dry NEUROLOGIC:  Alert and oriented x 3 PSYCHIATRIC:  Normal affect   ASSESSMENT:    1. Essential hypertension   2. Hyperlipidemia, unspecified hyperlipidemia type   3. Hypocalcemia   4. Acute on chronic diastolic heart failure (HCC)    PLAN:    In order of problems listed above:  1. Mild nonobstructive CAD, coronary CTA showed coronary calcium of 127 that was 71 percentile.  Age and sex.  There was mild nonobstructive CAD and she was started on Crestor 10 mg daily that she tolerates well.  She is currently asymptomatic and exercising she is congratulated on her efforts.  And encouraged to continue exercising.  We will recheck her lipids and LFTs today. 2. Acute on chronic diastolic CHF with minimal  lower extremity edema, she continues to take low-dose spironolactone 12.5 mg daily, we will check BMP and  BNP today and increase if needed. 3. Hypertension -controlled at home. 4. Hyperlipidemia -she was just started on Crestor we will recheck her lipids and LFTs today.   Medication Adjustments/Labs and Tests Ordered: Current medicines are reviewed at length with the patient today.  Concerns regarding medicines are outlined above.  Orders Placed This Encounter  Procedures  . Comp Met (CMET)  . CBC  . Pro b natriuretic peptide  . Lipid Profile  . EKG 12-Lead   No orders of the defined types were placed in this encounter.   Patient Instructions  Medication Instructions:   Your physician recommends that you continue on your current medications as directed. Please refer to the Current Medication list given to you today.  *If you need a refill on your cardiac medications before your next appointment, please call your pharmacy*   Lab Work:  TODAY--CMET, CBC, LIPIDS, AND PRO-BNP  If you have labs (blood work) drawn today and your tests are completely normal, you will receive your results only by: Marland Kitchen MyChart Message (if you have MyChart) OR . A paper copy in the mail If you have any lab test that is abnormal or we need to change your treatment, we will call you to review the results.  Follow-Up: At [facility], you and your health needs are our priority.  As part of our continuing mission to provide you with exceptional heart care, we have created designated Provider Care Teams.  These Care Teams include your primary Cardiologist (physician) and Advanced Practice Providers (APPs -  Physician Assistants and Nurse Practitioners) who all work together to provide you with the care you need, when you need it.  We recommend signing up for the patient portal called "MyChart".  Sign up information is provided on this After Visit Summary.  MyChart is used to connect with patients for Virtual  Visits (Telemedicine).  Patients are able to view lab/test results, encounter notes, upcoming appointments, etc.  Non-urgent messages can be sent to your provider as well.   To learn more about what you can do with MyChart, go to [email or web address].    Your next appointment:   6 month(s)  The format for your next appointment:   In Person  Provider:   Ena Dawley, MD        Signed, Ena Dawley, MD  02/17/2020 9:23 AM    [place]

## 2020-02-18 LAB — LIPID PANEL
Chol/HDL Ratio: 2.8 ratio (ref 0.0–4.4)
Cholesterol, Total: 116 mg/dL (ref 100–199)
HDL: 42 mg/dL (ref >39)
LDL Chol Calc (NIH): 64 mg/dL (ref 0–99)
Triglycerides: 39 mg/dL (ref 0–149)
VLDL Cholesterol Cal: 10 mg/dL (ref 5–40)

## 2020-02-18 LAB — COMPREHENSIVE METABOLIC PANEL
ALT: 32 IU/L (ref 0–32)
AST: 16 IU/L (ref 0–40)
Albumin/Globulin Ratio: 1.4 (ref 1.2–2.2)
Albumin: 4.3 g/dL (ref 3.7–4.7)
Alkaline Phosphatase: 99 IU/L (ref 48–121)
BUN/Creatinine Ratio: 17 (ref 12–28)
BUN: 17 mg/dL (ref 8–27)
Bilirubin Total: 0.4 mg/dL (ref 0.0–1.2)
CO2: 24 mmol/L (ref 20–29)
Calcium: 9.2 mg/dL (ref 8.7–10.3)
Chloride: 103 mmol/L (ref 96–106)
Creatinine, Ser: 1 mg/dL (ref 0.57–1.00)
GFR calc Af Amer: 65 mL/min/{1.73_m2} (ref >59)
GFR calc non Af Amer: 56 mL/min/{1.73_m2} — ABNORMAL LOW (ref >59)
Globulin, Total: 3 g/dL (ref 1.5–4.5)
Glucose: 90 mg/dL (ref 65–99)
Potassium: 4.1 mmol/L (ref 3.5–5.2)
Sodium: 142 mmol/L (ref 134–144)
Total Protein: 7.3 g/dL (ref 6.0–8.5)

## 2020-02-18 LAB — CBC
Hematocrit: 40.4 % (ref 34.0–46.6)
Hemoglobin: 13.3 g/dL (ref 11.1–15.9)
MCH: 29.6 pg (ref 26.6–33.0)
MCHC: 32.9 g/dL (ref 31.5–35.7)
MCV: 90 fL (ref 79–97)
Platelets: 253 10*3/uL (ref 150–450)
RBC: 4.49 x10E6/uL (ref 3.77–5.28)
RDW: 12.1 % (ref 11.7–15.4)
WBC: 9.5 10*3/uL (ref 3.4–10.8)

## 2020-02-18 LAB — PRO B NATRIURETIC PEPTIDE: NT-Pro BNP: 31 pg/mL (ref 0–301)

## 2020-02-26 ENCOUNTER — Encounter: Payer: Medicare HMO | Admitting: Family Medicine

## 2020-02-29 ENCOUNTER — Other Ambulatory Visit: Payer: Self-pay | Admitting: Family Medicine

## 2020-03-08 DIAGNOSIS — H40033 Anatomical narrow angle, bilateral: Secondary | ICD-10-CM | POA: Diagnosis not present

## 2020-03-08 DIAGNOSIS — H52223 Regular astigmatism, bilateral: Secondary | ICD-10-CM | POA: Diagnosis not present

## 2020-03-08 DIAGNOSIS — H524 Presbyopia: Secondary | ICD-10-CM | POA: Diagnosis not present

## 2020-03-08 DIAGNOSIS — H04123 Dry eye syndrome of bilateral lacrimal glands: Secondary | ICD-10-CM | POA: Diagnosis not present

## 2020-04-01 NOTE — Progress Notes (Addendum)
I connected with Jahnia today by telephone and verified that I am speaking with the correct person using two identifiers. Location patient: home Location provider: work Persons participating in the virtual visit: patient, Marine scientist.    I discussed the limitations, risks, security and privacy concerns of performing an evaluation and management service by telephone and the availability of in person appointments. I also discussed with the patient that there may be a patient responsible charge related to this service. The patient expressed understanding and verbally consented to this telephonic visit.    Interactive audio and video telecommunications were attempted between this provider and patient, however failed, due to patient having technical difficulties OR patient did not have access to video capability.  We continued and completed visit with audio only.  Some vital signs may be absent or patient reported.    Subjective:   Anne Barnes is a 74 y.o. female who presents for Medicare Annual (Subsequent) preventive examination.  Review of Systems    Cardiac Risk Factors include: advanced age (>81men, >23 women);dyslipidemia;hypertension;obesity (BMI >30kg/m2)     Objective:    Today's Vitals   04/04/20 1313  BP: 137/64  Pulse: 62  SpO2: 96%  Weight: 212 lb (96.2 kg)   Body mass index is 44.31 kg/m.  Advanced Directives 04/04/2020 01/13/2020 11/24/2019 04/02/2019 03/31/2018 09/15/2017 02/19/2017  Does Patient Have a Medical Advance Directive? No No No No Yes No No  Type of Advance Directive - - - - Press photographer;Living will - -  Does patient want to make changes to medical advance directive? No - Patient declined - - - No - Patient declined - -  Copy of [place] in Chart? - - - - No - copy requested - -  Would patient like information on creating a medical advance directive? - - - Yes (MAU/Ambulatory/Procedural Areas - Information given) - No - Patient  declined -    Current Medications (verified) Outpatient Encounter Medications as of 04/04/2020  Medication Sig  . albuterol (VENTOLIN HFA) 108 (90 Base) MCG/ACT inhaler INHALE 2 PUFFS INTO THE LUNGS EVERY 6 HOURS AS NEEDED FOR WHEEZING FOR SHORTNESS OF BREATH  . ALPRAZolam (XANAX) 0.5 MG tablet TAKE 1 TABLET BY MOUTH TWICE DAILY AS NEEDED FOR ANXIETY AND FOR SLEEP  . amLODipine (NORVASC) 2.5 MG tablet Take 1 tablet by mouth twice daily  . azelastine (ASTELIN) 0.1 % nasal spray Place 2 sprays into both nostrils 2 (two) times daily.  Marland Kitchen b complex vitamins tablet Take 1 tablet by mouth daily.  . Cholecalciferol (VITAMIN D) 2000 UNITS CAPS Take by mouth daily.   . fluticasone (FLONASE) 50 MCG/ACT nasal spray Place 2 sprays into both nostrils daily.  . Fluticasone Propionate (XHANCE) 93 MCG/ACT EXHU Place 2 sprays into the nose in the morning and at bedtime.  Marland Kitchen levocetirizine (XYZAL) 5 MG tablet Take 1 tablet (5 mg total) by mouth every evening.  Marland Kitchen losartan (COZAAR) 100 MG tablet Take 1 tablet by mouth once daily  . montelukast (SINGULAIR) 10 MG tablet Take 1 tablet (10 mg total) by mouth at bedtime.  . Multiple Vitamin (MULTIVITAMIN) tablet Take 1 tablet by mouth daily.    Marland Kitchen olopatadine (PATANOL) 0.1 % ophthalmic solution Place 1 drop into both eyes 2 (two) times daily.  Marland Kitchen omeprazole (PRILOSEC) 20 MG capsule Take 1 capsule by mouth once daily  . rosuvastatin (CRESTOR) 10 MG tablet Take 1 tablet (10 mg total) by mouth daily.  Marland Kitchen spironolactone (ALDACTONE)  25 MG tablet Take 1/2 (one-half) tablet by mouth once daily  . tiZANidine (ZANAFLEX) 2 MG tablet TAKE 1/2 TO 2 TABLETS BY MOUTH TWICE DAILY AS NEEDED FOR MUSCLE SPASM  . topiramate (TOPAMAX) 25 MG tablet Take 1 tablet (25 mg total) by mouth at bedtime.   No facility-administered encounter medications on file as of 04/04/2020.    Allergies (verified) Codeine, Hydralazine hcl, Lisinopril-hydrochlorothiazide, Metoprolol, Other, Shrimp [shellfish  allergy], and Tomato   History: Past Medical History:  Diagnosis Date  . Acute combined systolic and diastolic heart failure ([place]) 05/26/2015  . ALLERGIC RHINITIS   . Allergic urticaria 08/01/2014   Patient reports allergies to shrimp, egg whites, tomatoes, dairy   . 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   Office Spirometry 11/07/17-WNL-FVC 1.58/82%, FEV1 1.27/85%, ratio 0.80, FEF 25-75% 0.26/89%  . Bradycardia 02/25/2017  . Chiari malformation    Noted MRI brain 09/2013 - s/p neuro eval for same  . DIVERTICULOSIS, COLON   . Essential hypertension 08/28/2010   Qualifier: Diagnosis of  By: Marca Ancona RMA, Lucy     . Frequent headaches   . GERD   . H/O measles   . H/O mumps   . Hearing loss 01/18/2017  . History of chicken pox   . Hyperglycemia 03/05/2016  . HYPERTENSION   . Hypocalcemia 02/25/2017  . IBS (irritable bowel syndrome) 05/26/2015  . Nonspecific abnormal electrocardiogram (ECG) (EKG)   . OBSTRUCTIVE SLEEP APNEA 12/2008 dx   noncompliant with CPAP qhs  . Obstructive sleep apnea 08/28/2010   NPSG 12/2008:  AHI 13/hr with desats to 78%   . Preventative health care 06/11/2017  . Sciatica    right side  . Seasonal and perennial allergic rhinitis 08/28/2010   Allergy profile 03/11/14- positive especially for dust, cat and dog Food allergy profile- total IgE 86 with several food group elevations Sed rate-WNL     . Sinusitis 06/06/2017  . 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 Vaginitis 01/18/2017  . Vitamin D deficiency 06/11/2017   Past Surgical History:  Procedure Laterality Date  . ABDOMINAL HYSTERECTOMY  1986  . BREAST SURGERY  1973   Breast biopsy  . UMBILICAL HERNIA REPAIR     Family History  Problem Relation Age of Onset  . Arthritis Mother        died of complications from hip surgery  . Arthritis Father   . Kidney disease Father   . Pneumonia Father   . Heart disease Father   . Lupus Daughter   . Rheum arthritis Daughter   . Sjogren's syndrome Daughter   . Fibromyalgia Daughter    . Diabetes Brother   . Heart disease Brother   . Heart attack Brother   . Alcohol abuse Brother   . Throat cancer Brother   . Lung cancer Brother   . Kidney disease Sister        dialysis  . Obesity Sister   . Hypertension Maternal Grandfather   . Hypertension Sister   . Kidney disease Sister        dialysis  . Pneumonia Brother   . Hypertension Daughter   . Graves' disease Daughter   . Hypertension Daughter   . Alcohol abuse Other        parent  . Ataxia Neg Hx   . Chorea Neg Hx   . Dementia Neg Hx   . Mental retardation Neg Hx   . Migraines Neg Hx   . Multiple sclerosis Neg Hx   . Neurofibromatosis Neg Hx   .  Neuropathy Neg Hx   . Parkinsonism Neg Hx   . Seizures Neg Hx   . Stroke Neg Hx    Social History   Socioeconomic History  . Marital status: Widowed    Spouse name: Not on file  . Number of children: Not on file  . Years of education: Not on file  . Highest education level: Not on file  Occupational History  . Occupation: retired  Tobacco Use  . Smoking status: Never Smoker  . Smokeless tobacco: Never Used  . Tobacco comment: Married, lives with spouse, dtr 2 g-kids.  Vaping Use  . Vaping Use: Never used  Substance and Sexual Activity  . Alcohol use: No    Comment: rare  . Drug use: No  . Sexual activity: Not Currently    Comment: lives with husband, no dietary restrictions avoid foods allergic to, retired  Other Topics Concern  . Not on file  Social History Narrative   Right handed   One story with daughter and two grandkids   Social Determinants of Health   Financial Resource Strain: Low Risk   . Difficulty of Paying Living Expenses: Not hard at all  Food Insecurity: No Food Insecurity  . Worried About Charity fundraiser in the Last Year: Never true  . Ran Out of Food in the Last Year: Never true  Transportation Needs: No Transportation Needs  . Lack of Transportation (Medical): No  . Lack of Transportation (Non-Medical): No  Physical  Activity:   . Days of Exercise per Week:   . Minutes of Exercise per Session:   Stress:   . Feeling of Stress :   Social Connections:   . Frequency of Communication with Friends and Family:   . Frequency of Social Gatherings with Friends and Family:   . Attends Religious Services:   . Active Member of Clubs or Organizations:   . Attends Archivist Meetings:   Marland Kitchen Marital Status:     Tobacco Counseling Counseling given: Not Answered Comment: Married, lives with spouse, dtr 2 g-kids.   Clinical Intake: Pain : No/denies pain    Activities of Daily Living In your present state of health, do you have any difficulty performing the following activities: 04/04/2020  Hearing? N  Vision? N  Difficulty concentrating or making decisions? N  Walking or climbing stairs? N  Dressing or bathing? N  Doing errands, shopping? N  Preparing Food and eating ? N  Using the Toilet? N  In the past six months, have you accidently leaked urine? N  Do you have problems with loss of bowel control? N  Managing your Medications? N  Managing your Finances? N  Housekeeping or managing your Housekeeping? N  Some recent data might be hidden    Patient Care Team: Mosie Lukes, MD as PCP - General (Family Medicine) Dorothy Spark, MD as PCP - Cardiology (Cardiology) Juanita Craver, MD (Gastroenterology) Deneise Lever, MD as Consulting Physician (Pulmonary Disease)  Indicate any recent Medical Services you may have received from other than [facility] providers in the past year (date may be approximate).     Assessment:   This is a routine wellness examination for Anne Barnes.  Dietary issues and exercise activities discussed: Current Exercise Habits: Home exercise routine, Type of exercise: walking, Time (Minutes): 30, Frequency (Times/Week): 7, Weekly Exercise (Minutes/Week): 210, Intensity: Mild, Exercise limited by: None identified   Diet (meal preparation, eat out, water intake, caffeinated  beverages, dairy products, fruits and vegetables):  well balanced   Goals    . Weight (lb) < 200 lb (90.7 kg)      Depression Screen PHQ 2/9 Scores 04/04/2020 04/02/2019 03/31/2018 06/07/2016 05/26/2015 02/26/2013  PHQ - 2 Score 0 0 0 0 0 4  PHQ- 9 Score - - - - - 8    Fall Risk Fall Risk  04/04/2020 01/13/2020 04/02/2019 03/31/2018 06/07/2016  Falls in the past year? 0 0 0 No No  Number falls in past yr: 0 0 - - -  Injury with Fall? 0 0 - - -  Risk for fall due to : - No Fall Risks - - -  Follow up Education provided;Falls prevention discussed - - - -   Lives w/ dtr, and 2 grandsons.  Any stairs in or around the home? Yes  If so, are there any without handrails? No  Home free of loose throw rugs in walkways, pet beds, electrical cords, etc? Yes  Adequate lighting in your home to reduce risk of falls? Yes    Cognitive Function: Ad8 score reviewed for issues:  Issues making decisions:no  Less interest in hobbies / activities:no  Repeats questions, stories (family complaining):no  Trouble using ordinary gadgets (microwave, computer, phone):no  Forgets the month or year: no  Mismanaging finances: no  Remembering appts:no  Daily problems with thinking and/or memory:no Ad8 score is=0         Immunizations Immunization History  Administered Date(s) Administered  . Influenza, High Dose Seasonal PF 07/15/2013, 06/06/2018  . Influenza,inj,Quad PF,6+ Mos 08/29/2015  . Moderna SARS-COVID-2 Vaccination 11/26/2019, 12/24/2019  . Pneumococcal Conjugate-13 11/30/2015  . Pneumococcal Polysaccharide-23 02/26/2013  . Td 09/25/2003  . Tdap 08/29/2015    TDAP status: Up to date Pneumococcal vaccine status: Up to date Covid-19 vaccine status: Completed vaccines  Qualifies for Shingles Vaccine? Yes     Screening Tests Health Maintenance  Topic Date Due  . MAMMOGRAM  11/21/2017  . COLONOSCOPY  10/22/2019  . INFLUENZA VACCINE  04/24/2020  . TETANUS/TDAP  08/28/2025  . DEXA SCAN   Completed  . COVID-19 Vaccine  Completed  . Hepatitis C Screening  Completed  . PNA vac Low Risk Adult  Completed    Health Maintenance  Health Maintenance Due  Topic Date Due  . MAMMOGRAM  11/21/2017  . COLONOSCOPY  10/22/2019    Colorectal cancer screening: Referral to GI placed today. Pt aware the office will call re: appt. Pt declines mammogram. Bone Density status: Completed 04/13/19. Results reflect: Bone density results: OSTEOPOROSIS. Repeat every 2 years.  Lung Cancer Screening: (Low Dose CT Chest recommended if Age 42-80 years, 30 pack-year currently smoking OR have quit w/in 15years.) does not qualify.    Additional Screening  Vision Screening: Recommended annual ophthalmology exams for early detection of glaucoma and other disorders of the eye. Is the patient up to date with their annual eye exam?  Yes  Who is the provider or what is the name of the office in which the patient attends annual eye exams? Dr.Lee at [facility]: Recommended annual dental exams for proper oral hygiene  Community Resource Referral / Chronic Care Management: CRR required this visit?  No   CCM required this visit?  No      Plan:    Please schedule your next medicare wellness visit with me in 1 yr.  Continue to eat heart healthy diet (full of fruits, vegetables, whole grains, lean protein, water--limit salt, fat, and sugar intake) and increase physical  activity as tolerated.  Continue doing brain stimulating activities (puzzles, reading, adult coloring books, staying active) to keep memory sharp.    I have personally reviewed and noted the following in the patient's chart:   . Medical and social history . Use of alcohol, tobacco or illicit drugs  . Current medications and supplements . Functional ability and status . Nutritional status . Physical activity . Advanced directives . List of other physicians . Hospitalizations, surgeries, and ER visits in previous 12  months . Vitals . Screenings to include cognitive, depression, and falls . Referrals and appointments  In addition, I have reviewed and discussed with patient certain preventive protocols, quality metrics, and best practice recommendations. A written personalized care plan for preventive services as well as general preventive health recommendations were provided to patient.   Due to this being a telephonic visit, the after visit summary with patients personalized plan was offered to patient via mail or my-chart.  Patient would like to access on my-chart.   Naaman Plummer [place], [place]   04/04/2020   Kathlene November, MD

## 2020-04-04 ENCOUNTER — Other Ambulatory Visit: Payer: Self-pay

## 2020-04-04 ENCOUNTER — Encounter: Payer: Self-pay | Admitting: *Deleted

## 2020-04-04 ENCOUNTER — Ambulatory Visit (INDEPENDENT_AMBULATORY_CARE_PROVIDER_SITE_OTHER): Payer: Medicare HMO | Admitting: *Deleted

## 2020-04-04 VITALS — BP 137/64 | HR 62 | Wt 212.0 lb

## 2020-04-04 DIAGNOSIS — Z1211 Encounter for screening for malignant neoplasm of colon: Secondary | ICD-10-CM | POA: Diagnosis not present

## 2020-04-04 DIAGNOSIS — Z0000 Encounter for general adult medical examination without abnormal findings: Secondary | ICD-10-CM | POA: Diagnosis not present

## 2020-04-04 NOTE — Patient Instructions (Addendum)
Please schedule your next medicare wellness visit with me in 1 yr.  Continue to eat heart healthy diet (full of fruits, vegetables, whole grains, lean protein, water--limit salt, fat, and sugar intake) and increase physical activity as tolerated.  Continue doing brain stimulating activities (puzzles, reading, adult coloring books, staying active) to keep memory sharp.   I have placed referral for your colonoscopy.  Anne Barnes , Thank you for taking time to come for your Medicare Wellness Visit. I appreciate your ongoing commitment to your health goals. Please review the following plan we discussed and let me know if I can assist you in the future.   These are the goals we discussed: Goals    . Weight (lb) < 200 lb (90.7 kg)       This is a list of the screening recommended for you and due dates:  Health Maintenance  Topic Date Due  . Mammogram  11/21/2017  . Colon Cancer Screening  10/22/2019  . Flu Shot  04/24/2020  . Tetanus Vaccine  08/28/2025  . DEXA scan (bone density measurement)  Completed  . COVID-19 Vaccine  Completed  .  Hepatitis C: One time screening is recommended by Center for Disease Control  (CDC) for  adults born from [birth year] through 1964.   Completed  . Pneumonia vaccines  Completed    Preventive Care 32 Years and Older, Female Preventive care refers to lifestyle choices and visits with your health care provider that can promote health and wellness. This includes:  A yearly physical exam. This is also called an annual well check.  Regular dental and eye exams.  Immunizations.  Screening for certain conditions.  Healthy lifestyle choices, such as diet and exercise. What can I expect for my preventive care visit? Physical exam Your health care provider will check:  Height and weight. These may be used to calculate body mass index (BMI), which is a measurement that tells if you are at a healthy weight.  Heart rate and blood pressure.  Your skin for abnormal  spots. Counseling Your health care provider may ask you questions about:  Alcohol, tobacco, and drug use.  Emotional well-being.  Home and relationship well-being.  Sexual activity.  Eating habits.  History of falls.  Memory and ability to understand (cognition).  Work and work Statistician.  Pregnancy and menstrual history. What immunizations do I need?  Influenza (flu) vaccine  This is recommended every year. Tetanus, diphtheria, and pertussis (Tdap) vaccine  You may need a Td booster every 10 years. Varicella (chickenpox) vaccine  You may need this vaccine if you have not already been vaccinated. Zoster (shingles) vaccine  You may need this after age 15. Pneumococcal conjugate (PCV13) vaccine  One dose is recommended after age 15. Pneumococcal polysaccharide (PPSV23) vaccine  One dose is recommended after age 79. Measles, mumps, and rubella (MMR) vaccine  You may need at least one dose of MMR if you were born in 1957 or later. You may also need a second dose. Meningococcal conjugate (MenACWY) vaccine  You may need this if you have certain conditions. Hepatitis A vaccine  You may need this if you have certain conditions or if you travel or work in places where you may be exposed to hepatitis A. Hepatitis B vaccine  You may need this if you have certain conditions or if you travel or work in places where you may be exposed to hepatitis B. Haemophilus influenzae type b (Hib) vaccine  You may need this if you  have certain conditions. You may receive vaccines as individual doses or as more than one vaccine together in one shot (combination vaccines). Talk with your health care provider about the risks and benefits of combination vaccines. What tests do I need? Blood tests  Lipid and cholesterol levels. These may be checked every 5 years, or more frequently depending on your overall health.  Hepatitis C test.  Hepatitis B test. Screening  Lung cancer  screening. You may have this screening every year starting at age 24 if you have a 30-pack-year history of smoking and currently smoke or have quit within the past 15 years.  Colorectal cancer screening. All adults should have this screening starting at age 79 and continuing until age 42. Your health care provider may recommend screening at age 81 if you are at increased risk. You will have tests every 1-10 years, depending on your results and the type of screening test.  Diabetes screening. This is done by checking your blood sugar (glucose) after you have not eaten for a while (fasting). You may have this done every 1-3 years.  Mammogram. This may be done every 1-2 years. Talk with your health care provider about how often you should have regular mammograms.  BRCA-related cancer screening. This may be done if you have a family history of breast, ovarian, tubal, or peritoneal cancers. Other tests  Sexually transmitted disease (STD) testing.  Bone density scan. This is done to screen for osteoporosis. You may have this done starting at age 61. Follow these instructions at home: Eating and drinking  Eat a diet that includes fresh fruits and vegetables, whole grains, lean protein, and low-fat dairy products. Limit your intake of foods with high amounts of sugar, saturated fats, and salt.  Take vitamin and mineral supplements as recommended by your health care provider.  Do not drink alcohol if your health care provider tells you not to drink.  If you drink alcohol: ? Limit how much you have to 0-1 drink a day. ? Be aware of how much alcohol is in your drink. In the [place] one drink equals one 12 oz bottle of beer (355 mL), one 5 oz glass of wine (148 mL), or one 1 oz glass of hard liquor (44 mL). Lifestyle  Take daily care of your teeth and gums.  Stay active. Exercise for at least 30 minutes on 5 or more days each week.  Do not use any products that contain nicotine or tobacco, such as  cigarettes, e-cigarettes, and chewing tobacco. If you need help quitting, ask your health care provider.  If you are sexually active, practice safe sex. Use a condom or other form of protection in order to prevent STIs (sexually transmitted infections).  Talk with your health care provider about taking a low-dose aspirin or statin. What's next?  Go to your health care provider once a year for a well check visit.  Ask your health care provider how often you should have your eyes and teeth checked.  Stay up to date on all vaccines. This information is not intended to replace advice given to you by your health care provider. Make sure you discuss any questions you have with your health care provider. Document Revised: 09/04/2018 Document Reviewed: 09/04/2018 Elsevier Patient Education  2020 [employer].

## 2020-04-06 ENCOUNTER — Telehealth: Payer: Self-pay | Admitting: Family Medicine

## 2020-04-06 NOTE — Telephone Encounter (Signed)
Caller : Anne Barnes  Call Back # [phone]   Patient states that she has a mucus build up , patient states that she is congestion and hurting in her face. Patient states she is having problems with balance as well.

## 2020-04-06 NOTE — Telephone Encounter (Signed)
She probably needs a visit. She can try plain mucinex twice a day with probiotics and increased rest and fluid intake and then let us know if no improvement.

## 2020-04-06 NOTE — Telephone Encounter (Signed)
Patient has been taking mucinex, probiotics and drinking plenty.  No appts available for tomorrow.  She will go to urgent care to be evaluated.

## 2020-04-08 ENCOUNTER — Other Ambulatory Visit: Payer: Self-pay

## 2020-04-08 NOTE — Patient Outreach (Signed)
Aging Gracefully Program  04/08/2020  Anne Barnes 1946/06/22 [id number]   [facility] Evaluation Interviewer attempted to call patient on today regarding Aging Gracefully referral. No answer from patient after multiple rings.  Will attempt to call back within 1 week.   [place] Management Assistant [phone]

## 2020-04-13 ENCOUNTER — Ambulatory Visit: Payer: Self-pay

## 2020-04-13 ENCOUNTER — Other Ambulatory Visit: Payer: Self-pay

## 2020-04-13 NOTE — Patient Outreach (Signed)
Aging Gracefully Program  04/13/2020  Anne Barnes Jul 20, 1946 [id number]  [facility] Evaluation Interviewer made contact with patient. Aging Gracefully survey completed.   Interviewer will send referral to [name], RN and OT for follow up.  [place] Management Assistant [phone]

## 2020-04-20 LAB — FECAL OCCULT BLOOD, IMMUNOCHEMICAL: IFOBT: NEGATIVE

## 2020-04-25 ENCOUNTER — Ambulatory Visit: Payer: Medicare HMO | Admitting: Family Medicine

## 2020-04-26 ENCOUNTER — Encounter: Payer: Self-pay | Admitting: Family Medicine

## 2020-04-26 ENCOUNTER — Other Ambulatory Visit: Payer: Self-pay

## 2020-04-26 ENCOUNTER — Telehealth (INDEPENDENT_AMBULATORY_CARE_PROVIDER_SITE_OTHER): Payer: Medicare HMO | Admitting: Family Medicine

## 2020-04-26 VITALS — BP 134/77 | HR 68 | Ht <= 58 in

## 2020-04-26 DIAGNOSIS — Z9109 Other allergy status, other than to drugs and biological substances: Secondary | ICD-10-CM | POA: Diagnosis not present

## 2020-04-26 MED ORDER — PREDNISONE 10 MG PO TABS: ORAL_TABLET | ORAL | 0 refills | Status: DC

## 2020-04-26 NOTE — Progress Notes (Signed)
Virtual Visit via Video Note  I connected with Anne Barnes on 04/26/20 at  4:40 PM EDT by a video enabled telemedicine application and verified that I am speaking with the correct person using two identifiers.  Location: Patient: home  Provider: office   I discussed the limitations of evaluation and management by telemedicine and the availability of in person appointments. The patient expressed understanding and agreed to proceed.  History of Present Illness: Pt c/o mucus in throat and she is needing to use her inhaler --- she using her flonase and  xhance and azelastine hcl  She is also taking xyzal and claritin  Observations/Objective: Vitals:   04/26/20 1633  BP: 134/77  Pulse: 68  SpO2: 97%  pt isin nad  No sob, no cp   Assessment and Plan: 1. Environmental allergies con't nasal sprays con't antihistamine and add pred taper  - predniSONE (DELTASONE) 10 MG tablet; TAKE 3 TABLETS PO QD FOR 3 DAYS THEN TAKE 2 TABLETS PO QD FOR 3 DAYS THEN TAKE 1 TABLET PO QD FOR 3 DAYS THEN TAKE 1/2 TAB PO QD FOR 3 DAYS  Dispense: 20 tablet; Refill: 0   Follow Up Instructions:    I discussed the assessment and treatment plan with the patient. The patient was provided an opportunity to ask questions and all were answered. The patient agreed with the plan and demonstrated an understanding of the instructions.   The patient was advised to call back or seek an in-person evaluation if the symptoms worsen or if the condition fails to improve as anticipated.  I provided 30 minutes of non-face-to-face time during this encounter.   Ann Held, DO

## 2020-04-28 DIAGNOSIS — G4733 Obstructive sleep apnea (adult) (pediatric): Secondary | ICD-10-CM | POA: Diagnosis not present

## 2020-05-01 DIAGNOSIS — Z9109 Other allergy status, other than to drugs and biological substances: Secondary | ICD-10-CM | POA: Insufficient documentation

## 2020-05-01 NOTE — Assessment & Plan Note (Signed)
Cont nasal sprays and antihistamine Add pred taper  Call or rto prn

## 2020-05-02 ENCOUNTER — Other Ambulatory Visit: Payer: Self-pay | Admitting: Occupational Therapy

## 2020-05-02 ENCOUNTER — Other Ambulatory Visit: Payer: Self-pay

## 2020-05-02 NOTE — Patient Instructions (Signed)
Goals Addressed            This Visit's Progress   . Patient Stated       Feel more safe and independent with walking longer distances (a rollator would help with this)    . Patient Stated       Feel more safe and independent with showering (shower seat, grab bars, and handheld shower would A with this) Question: water is brown can this be fixed by [facility]?    Marland Kitchen Patient Stated       Be independent and not struggle as much with donning socks (a sock aid will help with this)    . Patient Stated       Be independent with getting in and out of her kid's cars that are hight ( a step stool with handle will help this)

## 2020-05-02 NOTE — Patient Outreach (Signed)
Aging Gracefully Program  OT Initial Visit  05/02/2020  Anne Barnes 07-Dec-1945 [id number]  Visit:  1- Initial Visit  Start Time:  1000 End Time:  9379 Total Minutes:  98  CCAP: Typical Daily Routine: Typical Daily Routine:: Gets up, drinks coffee, eats breakfast, does Bible study/devotion, showers, tidies up around house/watches TV What Types Of Care Problems Are You Having Throughout The Day?: Getting socks on, having to sit and rest What Kind Of Help Do You Receive?: Daughter does laundry, cooks, get groceries (sometimes  Do You Think You Need Other Types Of Help?: yes What Do You Think Would Make Everyday Life Easier For You?: being able to get my socks on, being able to get in and out of my kids cars (they are high for me), having grab bars in my shower and a hand held shower, having a walker with a seat on it so I can walk further ("I really like to walk but I tend to get tired so I can't walk as far as I would like"), having water I can drink and wash in that is not brown (we don't drink the water here) What Is A Good Day Like?: when I don't get dizzy What Is A Bad Day Like?: When I am dizzy Do You Have Time For Yourself?: yes Patient Reported Equipment: Patient Reported Equipment Currently Used: Bedside Commode Functional Mobility-Walking Indoors/Getting Around the House:   Functional Mobility-Walk A Block: Walk A Block: A Little Difficulty Do You:: No Device/No Assistance Importance Of Learning New Strategies:: Very Much Other Comments:: gets tired and winded with longer distances Functional Mobility-Maintain Balance While Showering: Maintaining Balance While Showering: A Little Difficulty Do You:: Use A Device Importance Of Learning New Strategies:: Very Much Other Comments:: lower 3n1 for now, consider shower chair so will have more space, hand held shower head, and grab bars Functional Mobility-Stooping, Crouching, Kneeling To Retreive Item: Stooping, Crouching, or  Kneeling To Retrieve Item: No Difficulty Functional Mobility-Bending From Standing Position To Pick Up Clothing Off The Floor: Bending Over From Standing Position To Pick Up Clothing Off The Floor: No Difficulty Functional Mobility-Reaching For Items Above Shoulder Level: Reaching For Items Above Shoulder Level: A Little Difficulty (with RUE) Importance Of Learning New Strategies:: Not At All Functional Mobility-Climb 1 Flight Of Stairs: Climb 1 Flight Of Stairs: A Little Difficulty (Has to stop,rest for a flight of steps, only has 4 at house) Do You:: No Device/No Assistance Importance Of Learning New Strategies:: Not At All Functional Mobility-Move In And Out Of Chair: Move In and Out Of A Chair: No Difficulty (kid's cars are high and she is 4'11") Functional Mobility-Move In And Out Of Bed: Move In and Out Of Bed: N/A (Sleeps in recliner) Functional Mobility-Move In And Out Of Bath/Shower: Move In And Out Of A Bath/Shower: A Little Difficulty Do You:: No Device/No Assistance Importance Of Learning New Strategies:: Very Much Other Comments:: grab bars and a smaller seat (than 3n1 to give her more room) Functional Mobility-Get On And Off Toilet: Getting Up From The Floor: No Difficulty Functional Mobility-Into And Out Of Car, Not Including Driving: Into  And Out Of Car, Not Including Driving: Moderate Difficulty Do You:: Use Personal Assistance Importance Of Learning New Strategies:: Very Much Other Comments:: A step stool would help with this    Activities of Daily Living-Bathing/Showering: ADL-Bathing/Showering: A Little Difficulty Do You:: Use A Device Importance Of Learning New Strategies: Very Much Other Comments:: grab bars and small seat would be  better as well as a hand held shower head Activities of Daily Living-Personal Hygiene and Grooming: [place] and Grooming: No Difficulty Activities of Daily Living-Toilet Hygiene: Toilet Hygiene: No Difficulty Activities  of Daily Living-Put On And Take Off Undergarments (Incl. Fasteners): Put On And Take Off Undergarments (Incl. Fasteners): No Difficulty Activities of Daily Living-Put On And Take Off Shirt/Dress/Coat (Incl. Fasteners): Put On And Take Off Shirt/Dress/Coat (Incl. Fasteners): No Difficulty Activities of Daily Living-Put On And Take Off Socks And Shoes: Put On And Take Off Socks And  Shoes: A Lot of Difficulty (donning socks) Do You:: No Device/No Assistance Importance Of Learning New Strategies: Very Much Other Comments:: a sock aid would help ADL Observation: Put On And Take Off Socks And Shoes: Independent With Pain, Difficulty, Or Use Of Device Safety: No Risk Efficiency: Not At All Intervention: Yes Activities of Daily Living-Feed Self: Feed Self: No Difficulty Activities of Daily Living-Rest And Sleep: Rest and Sleep: No Difficulty Other Comments:: Reports she only sleeps up to 4 hours at a time, can't ever remember not being this way Activities of Daily Living-Sexual Activity: Sexual  Activity: N/A  Instrumental Activities of Daily Living-Light Homemaking (Laundry, Straightening Up, Vacuuming):  Do Light Homemaking (Laundry, Straightening Up, Vacuuming): A Little Difficulty Do You:: Use Personal Assistance Importance Of Learning New Strategies: Not At All Other Comments:: "I do some of it, and what I can't do my daugther does" Instrumental Activities of Daily Living-Making A Bed: Making a Bed: A Little Difficulty Do You:: Use Personal Assistance Importance Of Learning New Strategies: Not At All Other Comments:: "what I can't do my daugther does for me" Instrumental Activities of Daily Living-Washing Dishes By Hand While Standing At The Sink: Washing Dishes By Hand While Standing At The Sink: A Little Difficulty Do You:: Use Personal Assistance Importance Of Learning New Strategies: Not At All Other Comments:: "what I can't do my daughter does for me" Instrumental Activities of  Daily Living-Grocery Shopping: Do Grocery Shopping: Moderate Difficulty Do You:: Use Personal Assistance Importance Of Learning New Strategies: Very Much Other Comments:: I do sometimes go with my daughter to go grocery shopping, but I get tired and have to prop on grocery cart Instrumental Activities of Daily Living-Use Telephone: Use Telephone: No Difficulty Instrumental Activities of Daily Living-Financial Management: Financial Management: No Difficulty Instrumental Activities of Daily Living-Medications: Take Medications: No Difficulty Other Comments:: would like a pill box that has both morning/night time boxes Instrumental Activities of Daily Living-Health Management And Maintenance: Health Management & Maintenance: A Little Difficulty Do You:: No Device/No Assistance Importance Of Learning New Strategies: A Little Other Comments:: wants to be able to walk longer distances Instrumental Activities of Daily Living-Meal Preparation and Clean-Up: Meal Preparation and Clean-Up: A Little Difficulty Do You:: Use Personal Assistance Importance Of Learning New Strategies: Not At All Other Comments:: "daughter cooks meals for me, I just have to heat them up" Instrumental Activities of Daily Living-Provide Care For Others/Pets: Care For Others/Pets: N/A Instrumental Activities of Daily Living-Take Part In Organized Social Activities: Take Part In Organized Social Activities: N/A Other Comments:: Was going to church some before [place], but not since. I wasn't involved much in social activities prior to [place] of Daily Living-Leisure Participation: Leisure Participation: N/A Instrumental Activities of Daily Living-Employment/Volunteer Activities: Employment/Volunteer Activities: N/A   Readiness To Change Score:  Readiness to Change Score: 10  Home Environment Assessment: Kitchen:: Door between kitchen and dining room is broken [employer]:: Master bath (door fell down  and no curtain, no grab bars, no hand held shower, light above sink does not work, wall paper is coming off walls Other Home Environment Concerns:: Water coming out of faucets is brown (not drinkable)   Goals: Goals Addressed            This Visit's Progress   . Patient Stated       Feel more safe and independent with walking longer distances (a rollator would help with this)    . Patient Stated       Feel more safe and independent with showering (shower seat, grab bars, and handheld shower would A with this) Question: water is brown can this be fixed by [facility]?    Marland Kitchen Patient Stated       Be independent and not struggle as much with donning socks (a sock aid will help with this)    . Patient Stated       Be independent with getting in and out of her kid's cars that are hight ( a step stool with handle will help this)       Post Clinical Reasoning: Clinician View Of Client Situation: Ms. Pointer is doing well. She is able to do her own basic ADLs and some of her IADLs (if she can't, then her daughter helps her). Her house is set up well to meet her needs now and in the future. She has sturdy handrails on both sides of her front steps, the parts of the house she occupies are on one level, She has very few throw rugs, he balance is good, and she has a walk in shower (that only needs minor modifications). She is quite able bodied with the only things slowing her down are intermittent dizziness and fatigue for longer distances. Ms .Buresh really wants to be able to walk more but is worried due to the two previous reasons stated. Client View Of His/Her Situation: Ms. Hurtado is overall happy with her health, she just would like to be able to walk/exercise more. She is intent on keeping moving, strong, and as independent as she can. She has great family support (her grandson stays with her overnight), another grandson came in while I was there (getting ready to start school to become a PTA), and her  daughter was there as well. Ms.Tapp is appreciative of the items we discussed. Next Visit Plan:: Rollator education

## 2020-05-03 ENCOUNTER — Other Ambulatory Visit: Payer: Self-pay | Admitting: *Deleted

## 2020-05-03 NOTE — Patient Outreach (Signed)
Aging Gracefully Program  05/03/2020  Anne Barnes 05/11/1946 [id number]  Placed call to [name] to schedule initial Aging Gracefully RN home visit  Received automated outgoing message on mobile number that patient has a voice mail box that has not been set up yet- unable to leave Voice message requesting call back  Immediately attempted call to patient's number listed as home number-- HIPAA compliant voice mail message left for patient, requesting return call back  Plan:  Will re-attempt [facility] CM telephone outreach later this week if I do not hear back from patient first.   [name], RN, BSN, CCRN [facility] Care Management  [phone]

## 2020-05-06 ENCOUNTER — Other Ambulatory Visit: Payer: Self-pay | Admitting: *Deleted

## 2020-05-06 NOTE — Patient Outreach (Signed)
Aging Gracefully Program  05/06/2020  KALYNNE WOMAC 1946-08-11 [id number]  Placed call to [name] to schedule initial Aging Gracefully RN home visit  Upon call attempt today, home phone number rang without physical answer and eventually was picked up with automated outgoing message stating, "Memory full; please enter remote access code," unable to leave patient voice message requesting call back.  Immediately attempted call to client's number listed as mobile number-- client answered; HIPAA/ identity verified and purpose of call discussed with client.  Visit scheduled with client for Tuesday May 10, 2020 at 1:15 pm; verified patient's address and explained that I would contact her by phone on day of scheduled visit prior to arriving at her home, to complete coronavirus questionnaire screening tool; confirmed that patient has a mask available at (her) home for use during scheduled visit.   Provided my direct contact information to client, should she wish to contact me prior to scheduled home visit.   Plan:   Scheduled RN initial home visit on Tuesday May 10, 2020 at 1:15 pm  [name], RN, BSN, [facility] Care Management  [phone]

## 2020-05-10 ENCOUNTER — Other Ambulatory Visit: Payer: Self-pay

## 2020-05-10 ENCOUNTER — Encounter: Payer: Self-pay | Admitting: *Deleted

## 2020-05-10 ENCOUNTER — Other Ambulatory Visit: Payer: Self-pay | Admitting: *Deleted

## 2020-05-10 NOTE — Patient Instructions (Addendum)
[name] it was great meeting and visiting with you today!  Please review the summary of our session below frequently and start to make changes we discussed to prevent falls, and keep doing the things you already are-- you are way ahead of the game and already on the right track.  If you think of other things you can do to prevent yourself from falling, write them down so we can talk about them when I call you for our next visit.  I have scheduled my next visit with you as a telephone call for Monday, June 13, 2020 between 12:30 pm and 1:30 pm... please be listening out for my call; I look forward to hearing about your progress  CLIENT/RN ACTION PLAN - [place] PREVENTION  Registered Nurse:  [name] RN Date: May 10, 2020  Client Name: Anne Barnes Client ID:  [id number]   Target Area:  FALL PREVENTION    Why Problem May Occur: -- occasional intermittent dizziness/ light-headedness -- occasional urinary incontinence -- occasional limited ability to perform exercises -- blood pressure medications can cause light-headedness   Target Goal: "I want to stay self-sufficient and not have any falls as I get older; I want to stay mobile."   STRATEGIES Coping Strategies Ideas  Change Positions Slowly: lying to sitting, sitting to standing . Changing positions can make people lightheaded. . When getting up in the morning sit for a few minutes, before standing.  . Stand for a few minutes before walking and hold on to sturdy furniture or countertop.  . Don't rush, for anything-- take your time with tasks, walking, answering door/ phone . If you find yourself feeling light-headed, sit down immediately  Drink water-- keep up the great work at staying hydrated and following your doctor's instructions on the amount you drink each day . Dehydration can make people dizzy and can lead to falls. . Ask your healthcare provider how much water you can drink. . Decrease caffeine, caffeine makes you  urinate a lot, which can make you dehydrated. .   If you fall, tell someone . Tell a friend or family member even if you didn't get hurt. . Tell your healthcare provider if you fall.  They can help you figure out why. . Write any falls on your calendar along with the circumstances that made you fall . Wear your life-alert system or keep your cell phone on you at all times, in case you fall and need help getting up, you will be able to call for help  Get your vision and hearing checked- good job in maintaining these important annual appointments . Poor vision and hearing loss can make people fall. . Glaucoma and diabetes can cause poor vision. .   Other .    Prevention Ideas  Review your Medicines . Many medicines can make you dizzy or sleepy and increase your risk of falling. . Your Aging Gracefully Nurse will look at your medications and see if you are taking any that might cause falling. . Always talk to your doctor about your medicines with each visit-- call your doctor if you think you are having side effects from your medicines that might cause you to fall  Activity and Exercise-- keep up the great work in doing your own exercises and walking routines-- gradually incorporate the Aging Gracefully exercises into your routine . Aging Gracefully exercises . Walking (inside or outside) . Dancing . Gardening . Housework:  cooking, Education administrator, Medical sales representative . Exercise while watching TV . Swimming or  water aerobics. .   Strengthen Bones- consider asking your doctor if you should take calcium (TUMS) in addition to the vitamin D you already take . Exercise makes your bones stronger. . Vitamin D and Calcium make your bones stronger.  Ask your Healthcare provider if you should be taking them. . Your body makes vitamin D from the sun.  Sit in the sun for 10 minutes every day (without sunscreen). .   Control your urine  . Prevent constipation . Cut back on caffeinated drinks. . Don't wait to  urinate. . If diabetic, control your blood sugar. . Ask your Aging Gracefully Nurse and Healthcare Provider  about urine control. . Try the Kegel exercises we discussed and see if you think it helps you control your urges to urinate  Control your blood sugar (if you are diabetic) . High blood sugar can cause frequent urination, poor vision, and numbness in your feet, which can make you fall.   . Low blood sugar can cause confusion and dizziness. .   Other coping strategies 1. Always wear shoes with good soles 2.  Pay close attention to weather conditions, especially when it is icy or wet outside 3. Maintain good posture when standing, sitting, and walking 4.  Practice using the walker when you walk long distances, once the Occupational therapist provides it to you    PRACTICE It is important to practice the strategies so we can determine if they will be effective in helping to reach the goal.    Follow these specific recommendations:        If strategy does not work the first time, try it again.     We may make some changes over the next few sessions.      [name], RN, BSN, [employer] [facility] Care Management  [phone]

## 2020-05-10 NOTE — Patient Outreach (Signed)
Aging Gracefully Program  RN Visit # 1  05/10/2020  Anne Barnes Feb 05, 1946 [id number]  Visit:   RN Visit # 1- in person at patient's home; corona virus screening completed prior to arriving at patient's home- no concerns identified; patient reports being fully vaccinated for corona virus  Start Time:   13:05 End Time:   14:45 Total Minutes:   100  Readiness To Change Score:  Readiness to Change Score: 9.33  Universal RN Interventions: Calendar Distribution: Yes (reviewed with client today) Exercise Review: Yes (reviewed with client today) Medications: Yes (reviewed with client today) Medication Changes: No Mood: Yes (reviewed with client today) Pain: Yes (reviewed with client today) PCP Advocacy/Support: Yes (reviewed with client today) Fall Prevention: Yes (reviewed with client today) Incontinence: Yes (reviewed with client today)  Healthcare Provider Communication: Did Higher education careers adviser With [place] Provider?: No  Clinician View of Client Situation: Clinician View Of Client Situation: Anne Barnes is a delight to visit; she has a great sense of humor and is very motivated to stay safe and healthy as she ages; she experienced the loss of her husband of 43 years in November 2020 and is now living alone; she has a strong and very supportive family that all assist with care needs as indicated and are very involved with Anne Barnes, visiting her almost daily.  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  Anne Barnes is excited about the Aging Gracefully program and getting home modifications to keep her independent and at home; would like to work on a target area of fall prevention   Client's View of His/Her Situation: Ambulance person Of His/Her Situation: "I am doing everything I can to stay self-sufficient as I get older; things have been a bit hard since I lost my husband this past  November, but I want to be able to do things for myself; my family is so good to me and want to help me, and I have to remind them not to do things for me-- I need to do things for myself so I can stay independent.  I have not had any falls yet, and I want to keep it that way"  Medication Assessment: Do You Have Any Problems Paying For Medications?: No Where Does Client Store Medications?: Other: (small storage bin; pill box provided today) Can Client Read Pill Bottles?: Yes Does Client Use A Pillbox?: No (provided today; instruction in use of pill box provided) Does Anyone Assist Client In Taking Medications?: No Do You Take Vitamin D?: Yes Total Number Of Medications That The Client Takes: 14 Does Client Have Any Questions Or Concerns About Medictions?: No Is Client Complaining Of Any Symptoms That Could Be Side Effects To Medications?: Yes (intermittent dizziness; reports this is from her "allergies" not her medications) Any Possible Changes In Medication Regimen?: No  OT Update: Will update Aging Gracefully team around successful completion of RN Visit # 1  Session Summary: Pleasant RN Visit; patient is very motivated to stay independent and self-sufficient; she is independent at baseline and very astute around her self-care needs.  She would like to work on a target goal of fall prevention.  It was a pleasure visiting with Anne Barnes today,  Anne Rack, RN, BSN, [employer] Coordinator [facility] Care Management  [phone]

## 2020-05-13 ENCOUNTER — Ambulatory Visit: Payer: Medicare HMO | Admitting: Allergy

## 2020-05-13 ENCOUNTER — Encounter: Payer: Self-pay | Admitting: Allergy

## 2020-05-13 ENCOUNTER — Other Ambulatory Visit: Payer: Self-pay

## 2020-05-13 VITALS — BP 132/78 | HR 59 | Temp 97.6°F | Resp 16

## 2020-05-13 DIAGNOSIS — T7800XD Anaphylactic reaction due to unspecified food, subsequent encounter: Secondary | ICD-10-CM

## 2020-05-13 DIAGNOSIS — J328 Other chronic sinusitis: Secondary | ICD-10-CM

## 2020-05-13 DIAGNOSIS — J4520 Mild intermittent asthma, uncomplicated: Secondary | ICD-10-CM

## 2020-05-13 DIAGNOSIS — J3089 Other allergic rhinitis: Secondary | ICD-10-CM

## 2020-05-13 DIAGNOSIS — T7800XA Anaphylactic reaction due to unspecified food, initial encounter: Secondary | ICD-10-CM

## 2020-05-13 NOTE — Patient Instructions (Addendum)
-   continue avoidance measures for dust mites, cat and dog.   - continue Xyzal 5mg  daily  - continue Singulair 10mg  daily - can use Xhance nasal device periodically if having more severe nasal congestion/sinus congestion  - for nasal drainage and post-nasal drip continue use of nasal antihistamine, Astelin 2 sprays each nostril 1-2 times a day as needed.  This nasal spray is different than Xhance.   - recommend performing nasal saline rinses prior to use of medicated nasal sprays to help flush out the sinuses.  Use with distilled water at room temperature.  Breathe in and out through your mouth during the entire process. - if medication management is ineffective consider allergen immunotherapy (allergy shots) which is a long-term therapy that takes 3-5 years to re-train the body to tolerate exposure to what you are allergic to in the environment.  We discussed this today and if wanting to proceed can schedule a new start appointment.  - continue food avoidance of shellfish, gluten, soybean, peanut - skin testing to these foods were negative at initial visit.   - have access to your epinephrine device in case of allergic reaction.   - follow emergency action plan in case of allergic reaction   - have access to albuterol inhaler 2 puffs every 4-6 hours as needed for cough/wheeze/shortness of breath/chest tightness.  May use 15-20 minutes prior to activity.   Monitor frequency of use.    Follow-up 4-6 months or sooner if needed

## 2020-05-13 NOTE — Progress Notes (Signed)
Follow-up Note  RE: ARIYON Barnes MRN: [id number] DOB: 11-26-1945 Date of Office Visit: 05/13/2020   History of present illness: Anne Barnes is a 74 y.o. female presenting today for follow-up of allergic rhinitis with history of chronic rhinosinusitis, food allergy and mild asthma.  She was last seen in the office on 2020-01-28 by myself.  She states she stopped her blood pressure medicine on her own about 2 to 3 days ago due to having lightheadedness and dizziness.  This has improved off of the blood pressure medication.  She states she did notify her PCP in regards to this. She states that she continues to have drainage in her throat that she feels mostly on the left side that she can't seem to clear well.  She states after starting the [facility] nasal spray device that it initially helped but by the second bottle she was noting that she was getting lightheaded with use so she stopped.  She continues to do Astelin 2 sprays each nostril twice a day and states that it is helping.  She has not tried performing nasal rinses at this time.  She does continue on Xyzal and Singulair as well.  She is interested in allergy shots at this time.  She states she is very cleanly and mops her hard surface floors multiple times a week as well as changing out her comforter and pillowcases multiple times a week as well. After food testing she states she did introduce tomatoes, strawberry and watermelon back into her diet and she has not had any issues.  She still avoid shellfish, nuts, soy, wheat in the diet.  She does have access to an epinephrine device.  She also prefers to an albuterol inhaler with very infrequent use.    Review of systems: Review of Systems  Constitutional: Negative.   HENT:       See HPI  Eyes: Negative.   Respiratory: Negative.   Cardiovascular: Negative.   Gastrointestinal: Negative.   Musculoskeletal: Negative.   Skin: Negative.   Neurological: Negative.     All other systems  negative unless noted above in HPI  Past medical/social/surgical/family history have been reviewed and are unchanged unless specifically indicated below.  No changes  Medication List: Current Outpatient Medications  Medication Sig Dispense Refill  . albuterol (VENTOLIN HFA) 108 (90 Base) MCG/ACT inhaler INHALE 2 PUFFS INTO THE LUNGS EVERY 6 HOURS AS NEEDED FOR WHEEZING FOR SHORTNESS OF BREATH 18 g 12  . ALPRAZolam (XANAX) 0.5 MG tablet TAKE 1 TABLET BY MOUTH TWICE DAILY AS NEEDED FOR ANXIETY AND FOR SLEEP 20 tablet 0  . amLODipine (NORVASC) 2.5 MG tablet Take 1 tablet by mouth twice daily (Patient not taking: Reported on 05/13/2020) 180 tablet 3  . azelastine (ASTELIN) 0.1 % nasal spray Place 2 sprays into both nostrils 2 (two) times daily. 30 mL 5  . b complex vitamins tablet Take 1 tablet by mouth daily.    . Cholecalciferol (VITAMIN D) 2000 UNITS CAPS Take by mouth daily.     . fluticasone (FLONASE) 50 MCG/ACT nasal spray Place 2 sprays into both nostrils daily. (Patient not taking: Reported on 05/10/2020) 16 g 6  . Fluticasone Propionate (XHANCE) 93 MCG/ACT EXHU Place 2 sprays into the nose in the morning and at bedtime. (Patient not taking: Reported on 05/10/2020) 32 mL 3  . levocetirizine (XYZAL) 5 MG tablet Take 1 tablet (5 mg total) by mouth every evening. 30 tablet 3  . losartan (COZAAR) 100 MG  tablet Take 1 tablet by mouth once daily 90 tablet 1  . montelukast (SINGULAIR) 10 MG tablet Take 1 tablet (10 mg total) by mouth at bedtime. 30 tablet 3  . Multiple Vitamin (MULTIVITAMIN) tablet Take 1 tablet by mouth daily.      Marland Kitchen olopatadine (PATANOL) 0.1 % ophthalmic solution Place 1 drop into both eyes 2 (two) times daily. 5 mL 12  . omeprazole (PRILOSEC) 20 MG capsule Take 1 capsule by mouth once daily 90 capsule 0  . predniSONE (DELTASONE) 10 MG tablet TAKE 3 TABLETS PO QD FOR 3 DAYS THEN TAKE 2 TABLETS PO QD FOR 3 DAYS THEN TAKE 1 TABLET PO QD FOR 3 DAYS THEN TAKE 1/2 TAB PO QD FOR 3 DAYS  (Patient not taking: Reported on 05/13/2020) 20 tablet 0  . rosuvastatin (CRESTOR) 10 MG tablet Take 1 tablet (10 mg total) by mouth daily. 90 tablet 2  . spironolactone (ALDACTONE) 25 MG tablet Take 1/2 (one-half) tablet by mouth once daily 30 tablet 5  . tiZANidine (ZANAFLEX) 2 MG tablet TAKE 1/2 TO 2 TABLETS BY MOUTH TWICE DAILY AS NEEDED FOR MUSCLE SPASM (Patient not taking: Reported on 05/10/2020) 30 tablet 0  . topiramate (TOPAMAX) 25 MG tablet Take 1 tablet (25 mg total) by mouth at bedtime. (Patient not taking: Reported on 05/10/2020) 30 tablet 3   No current facility-administered medications for this visit.     Known medication allergies: Allergies  Allergen Reactions  . Codeine Nausea And Vomiting  . Hydralazine Hcl Other (See Comments)    Pt reports causes dizziness  . Lisinopril-Hydrochlorothiazide Hives    REACTION: lip swelling,facial swelling,rash.  . Metoprolol Other (See Comments)    Does not feel well on it   . Other Cough    Patient states she is allergic to Shrimp, egg whites, tomatoes and dairy products.  Says they make her feel unwell but do not cause hives, SOB or anaphylaxis.  She just avoids eating them because they can make her have a headache.  Strawberries, watermelon, peanuts, and all shellfish.    . Shrimp [Shellfish Allergy] Other (See Comments)    Was allergy tested and showed shrimp was one--has not reacted b/c avoided  . Tomato Other (See Comments)     Physical examination: Blood pressure 132/78, pulse (!) 59, temperature 97.6 F (36.4 C), temperature source Temporal, resp. rate 16, SpO2 96 %.  General: Alert, interactive, in no acute distress. HEENT: TMs pearly gray, turbinates minimally edematous without discharge, post-pharynx non erythematous. Neck: Supple without lymphadenopathy. Lungs: Clear to auscultation without wheezing, rhonchi or rales. {no increased work of breathing. CV: Normal S1, S2 without murmurs. Abdomen: Nondistended,  nontender. Skin: Warm and dry, without lesions or rashes. Extremities:  No clubbing, cyanosis or edema. Neuro:   Grossly intact.  Diagnositics/Labs: Labs:  Component     Latest Ref Rng & Units 01/28/2020  Clam IgE     Class 0 kU/L <0.10  F023-IgE Crab     Class 0 kU/L <0.10  Shrimp IgE     Class II kU/L 0.69 (A)  Scallop IgE     Class 0 kU/L <0.10  F290-IgE Oyster     Class 0 kU/L <0.10  F080-IgE Lobster     Class 0 kU/L <0.10  Peanut IgE     Class II kU/L 0.95 (A)  Soybean IgE     Class 0/I kU/L 0.22 (A)  Wheat IgE     Class II kU/L 1.00 (A)  Allergen Tomato, IgE  Class 0 kU/L <0.10  Allergen Strawberry IgE     Class 0 kU/L <0.10  Allergen Watermelon IgE     Class 0 kU/L <0.10    Assessment and plan:   Allergic rhinitis Chronic rhinosinusitis - continue avoidance measures for dust mites, cat and dog.  - continue Xyzal 5mg  daily  - continue Singulair 10mg  daily - can use Xhance nasal device periodically if having more severe nasal congestion/sinus congestion  - for nasal drainage and post-nasal drip continue use of nasal antihistamine, Astelin 2 sprays each nostril 1-2 times a day as needed.  This nasal spray is different than Xhance.   - recommend performing nasal saline rinses prior to use of medicated nasal sprays to help flush out the sinuses.  Use with distilled water at room temperature.  Breathe in and out through your mouth during the entire process. - if medication management is ineffective consider allergen immunotherapy (allergy shots) which is a long-term therapy that takes 3-5 years to re-train the body to tolerate exposure to what you are allergic to in the environment.  We discussed this today and if wanting to proceed can schedule a new start appointment.  Anaphylaxis due to food - continue food avoidance of shellfish, gluten, soybean, peanut - skin testing to these foods were negative at initial visit.   - have access to your epinephrine device in  case of allergic reaction.   - follow emergency action plan in case of allergic reaction   Mild intermittent asthma - have access to albuterol inhaler 2 puffs every 4-6 hours as needed for cough/wheeze/shortness of breath/chest tightness.  May use 15-20 minutes prior to activity.   Monitor frequency of use.    Follow-up 4-6 months or sooner if needed I appreciate the opportunity to take part in [place] care. Please do not hesitate to contact me with questions.  Sincerely,   Prudy Feeler, MD Allergy/Immunology [facility]

## 2020-05-17 ENCOUNTER — Encounter: Payer: Self-pay | Admitting: *Deleted

## 2020-05-18 NOTE — Progress Notes (Signed)
VIAL EXP 05-18-21

## 2020-05-18 NOTE — Addendum Note (Signed)
Addended by: Theresia Lo on: 05/18/2020 01:25 PM   Modules accepted: Orders

## 2020-05-19 DIAGNOSIS — J3081 Allergic rhinitis due to animal (cat) (dog) hair and dander: Secondary | ICD-10-CM | POA: Diagnosis not present

## 2020-05-23 ENCOUNTER — Other Ambulatory Visit: Payer: Self-pay | Admitting: *Deleted

## 2020-05-23 ENCOUNTER — Telehealth: Payer: Self-pay | Admitting: Family Medicine

## 2020-05-23 NOTE — Patient Outreach (Signed)
[facility]  05/23/2020  Anne Barnes [birth year] [id number]  [facility] RN call to client to confirm upcoming scheduled RN Visit # 2 by phone:  Client contacted me and left voice message requesting follow up call to clarify our next appointment; confirmed for patient that this is scheduled for Monday June 13, 2020 as a phone call between 1- 2:00 pm; patient confirms she will be listening out for my phone call and confirmed that she does not wish to re-schedule this appointment.  Plan:  [facility] RN Visit # 2- by phone as previously scheduled for 06/13/20   [name], RN, BSN, [facility]  [phone]

## 2020-05-23 NOTE — Telephone Encounter (Signed)
Patient wanted to let Dr. B know that she stopped taking her Blood pressure meds on  05/10/20.  She states she monitor her pressure 3 times a day. And today's reading 131/61 9:30  128/60 at 1:30.. She states she knows Dr. Jacinto Reap will be upset that she stopped taking meds without speaking to her 1st. But she wanted let you know that's  Her new plan of action for her Blood pressure and dizziness.

## 2020-05-23 NOTE — Progress Notes (Signed)
NEUROLOGY FOLLOW UP OFFICE NOTE  Anne Barnes [id number]  HISTORY OF PRESENT ILLNESS: Anne Barnes is a 74 year old right-handed woman with history of OSA, chronic sinusitis, and hypertension who follows up for headache and dizziness.  UPDATE: Started on topiramate in April, however headaches stopped after 2 weeks and then she discontinued topiramate.  She may have a mild headache once in awhile but nothing severe.  MRI of brain without contrast on 01/27/2020 personally reviewed confirmed Chiari 1 malformation but no crowding or abnormal morphology of brain structures within the posterior fossa.. She followed up with pulmonology.  No change in management for OSA.  Has follow up next month.     HISTORY: She has had headaches for several years, which she attributes to chronic sinusitis.  I saw her back in late 2014.  At that time, she  described as 10/10 bifrontal and left temporal pounding headache lasting several seconds and triggered by coughing or sneezing but not usually by bending over.  She had an underlying constant aching that occurred almost daily.  She also had left sided neck pain radiating up to behind her left ear but not into the arm.  No associated visual disturbance, nausea, vomiting, photophobia, phonophobia, or dizziness.  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 She deferred evaluation by neurosurgery.  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  She was on indomethacin for a time with variable efficacy.  She was last seen in 2015.  For ongoing headache and neck pain, she underwent CT head and cervical spine on 02/19/2017 which was personally reviewed and again demonstrated Chiari with tonsillar ectopia 13 mm below foramen magnum and mild degenerative changes at C2-3 and C3-4 on the left and C4-5 and  C5-T2.  However, she also demonstrated ventriculomegaly as well.  Symptoms particularly got worse since her husband passed away in 09-03-2019.  In January 2020, she started experiencing severe right sided headaches as well.  They are infrequent, maybe occurring once a month.  She continues to have a dull daily headache.  She wakes up in the morning with it and it resolves by the afternoon. Neck pain is not too much of an issue.  Sometimes it feels still which she attributes to sleeping in a recliner.  No associated visual disturbance, nausea, vomiting, numbness or weakness.  She reports increased dizziness and anxiety.  Dizziness is described as a lightheadedness, rarely with spinning, and occurs only when she is standing and moving about.  It does not occur while sitting or at rest.  Sometimes she feels like she may pass out but that has not happened.  She also reports worsening hearing and tinnitus in her right ear.  No double vision or trouble swallowing.  She was evaluated by ENT.  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  Hearing aids were recommended.  CT of paranasal sinuses were clear.  She was advised to see cardiology for evaluation of heart murmur.  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  14 day Zio patch demonstrated sinus bradycardia to sinus tachycardia (39 bpm to 167 bpm with average of 57 bpm) and very short episodes of SVT but no pauses or atrial fibrillation.  She is treated for OSA with CPAP.  Pulmonology treated her for bronchitis.  She was seen in the ED on 11/24/2019 for headache.  CT head personally reviewed showed stable age-related atrophy and ventriculomegaly but without apparent crowding of the structures in the posterior fossa and no acute abnormality.  She has continued to experience rhinorrhea, cough and nasal congestion related to allergies.    Fioricet -  Twice a day about 5 days  Tizanidine every other week sleeps in recliner  PAST  MEDICAL HISTORY: Past Medical History:  Diagnosis Date  . Acute combined systolic and diastolic heart failure ([facility]) 05/26/2015  . ALLERGIC RHINITIS   . Allergic urticaria 08/01/2014   Patient reports allergies to shrimp, egg whites, tomatoes, dairy   . 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   Office Spirometry 11/07/17-WNL-FVC 1.58/82%, FEV1 1.27/85%, ratio 0.80, FEF 25-75% 0.26/89%  . Bradycardia 02/25/2017  . Chiari malformation    Noted MRI brain 09/2013 - s/p neuro eval for same  . DIVERTICULOSIS, COLON   . Essential hypertension 08/28/2010   Qualifier: Diagnosis of  By: Marca Ancona RMA, Lucy     . Frequent headaches   . GERD   . H/O measles   . H/O mumps   . Hearing loss 01/18/2017  . History of chicken pox   . Hyperglycemia 03/05/2016  . HYPERTENSION   . Hypocalcemia 02/25/2017  . IBS (irritable bowel syndrome) 05/26/2015  . Nonspecific abnormal electrocardiogram (ECG) (EKG)   . OBSTRUCTIVE SLEEP APNEA 12/2008 dx   noncompliant with CPAP qhs  . Obstructive sleep apnea 08/28/2010   NPSG 12/2008:  AHI 13/hr with desats to 78%   . Preventative health care 06/11/2017  . Sciatica    right side  . Seasonal and perennial allergic rhinitis 08/28/2010   Allergy profile 03/11/14- positive especially for dust, cat and dog Food allergy profile- total IgE 86 with several food group elevations Sed rate-WNL     . Sinusitis 06/06/2017  . 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 Vaginitis 01/18/2017  . Vitamin D deficiency 06/11/2017    MEDICATIONS: Current Outpatient Medications on File Prior to Visit  Medication Sig Dispense Refill  . albuterol (VENTOLIN HFA) 108 (90 Base) MCG/ACT inhaler INHALE 2 PUFFS INTO THE LUNGS EVERY 6 HOURS AS NEEDED FOR WHEEZING FOR SHORTNESS OF BREATH 18 g 12  . ALPRAZolam (XANAX) 0.5 MG tablet TAKE 1 TABLET BY MOUTH TWICE DAILY AS NEEDED FOR ANXIETY AND FOR SLEEP 20 tablet 0  . amLODipine (NORVASC) 2.5 MG tablet Take 1 tablet by mouth twice daily (Patient not taking: Reported on 05/13/2020)  180 tablet 3  . azelastine (ASTELIN) 0.1 % nasal spray Place 2 sprays into both nostrils 2 (two) times daily. 30 mL 5  . b complex vitamins tablet Take 1 tablet by mouth daily.    . Cholecalciferol (VITAMIN D) 2000 UNITS CAPS Take by mouth daily.     . fluticasone (FLONASE) 50 MCG/ACT nasal spray Place 2 sprays into both nostrils daily. (Patient not taking: Reported on 05/10/2020) 16 g 6  . Fluticasone Propionate (XHANCE) 93 MCG/ACT EXHU Place 2 sprays into the nose in the morning and at bedtime. (Patient not taking: Reported on 05/10/2020) 32 mL 3  . levocetirizine (XYZAL) 5 MG tablet Take 1 tablet (5 mg total) by mouth every evening. 30 tablet 3  . losartan (COZAAR) 100 MG tablet Take 1 tablet by mouth once daily 90 tablet 1  . montelukast (SINGULAIR) 10 MG tablet Take 1 tablet (10 mg total) by mouth at bedtime. 30 tablet 3  . Multiple Vitamin (MULTIVITAMIN) tablet Take 1 tablet by mouth daily.      Marland Kitchen olopatadine (  PATANOL) 0.1 % ophthalmic solution Place 1 drop into both eyes 2 (two) times daily. 5 mL 12  . omeprazole (PRILOSEC) 20 MG capsule Take 1 capsule by mouth once daily 90 capsule 0  . rosuvastatin (CRESTOR) 10 MG tablet Take 1 tablet (10 mg total) by mouth daily. 90 tablet 2  . spironolactone (ALDACTONE) 25 MG tablet Take 1/2 (one-half) tablet by mouth once daily 30 tablet 5  . tiZANidine (ZANAFLEX) 2 MG tablet TAKE 1/2 TO 2 TABLETS BY MOUTH TWICE DAILY AS NEEDED FOR MUSCLE SPASM (Patient not taking: Reported on 05/10/2020) 30 tablet 0  . topiramate (TOPAMAX) 25 MG tablet Take 1 tablet (25 mg total) by mouth at bedtime. (Patient not taking: Reported on 05/10/2020) 30 tablet 3   No current facility-administered medications on file prior to visit.    ALLERGIES: Allergies  Allergen Reactions  . Codeine Nausea And Vomiting  . Hydralazine Hcl Other (See Comments)    Pt reports causes dizziness  . Lisinopril-Hydrochlorothiazide Hives    REACTION: lip swelling,facial swelling,rash.  .  Metoprolol Other (See Comments)    Does not feel well on it   . Other Cough    Patient states she is allergic to Shrimp, egg whites, tomatoes and dairy products.  Says they make her feel unwell but do not cause hives, SOB or anaphylaxis.  She just avoids eating them because they can make her have a headache.  Strawberries, watermelon, peanuts, and all shellfish.    . Shrimp [Shellfish Allergy] Other (See Comments)    Was allergy tested and showed shrimp was one--has not reacted b/c avoided  . Tomato Other (See Comments)    FAMILY HISTORY: Family History  Problem Relation Age of Onset  . Arthritis Mother        died of complications from hip surgery  . Arthritis Father   . Kidney disease Father   . Pneumonia Father   . Heart disease Father   . Lupus Daughter   . Rheum arthritis Daughter   . Sjogren's syndrome Daughter   . Fibromyalgia Daughter   . Diabetes Brother   . Heart disease Brother   . Heart attack Brother   . Alcohol abuse Brother   . Throat cancer Brother   . Lung cancer Brother   . Kidney disease Sister        dialysis  . Obesity Sister   . Hypertension Maternal Grandfather   . Hypertension Sister   . Kidney disease Sister        dialysis  . Pneumonia Brother   . Hypertension Daughter   . Graves' disease Daughter   . Hypertension Daughter   . Alcohol abuse Other        parent  . Ataxia Neg Hx   . Chorea Neg Hx   . Dementia Neg Hx   . Mental retardation Neg Hx   . Migraines Neg Hx   . Multiple sclerosis Neg Hx   . Neurofibromatosis Neg Hx   . Neuropathy Neg Hx   . Parkinsonism Neg Hx   . Seizures Neg Hx   . Stroke Neg Hx     SOCIAL HISTORY: Social History   Socioeconomic History  . Marital status: Widowed    Spouse name: Not on file  . Number of children: Not on file  . Years of education: Not on file  . Highest education level: Not on file  Occupational History  . Occupation: retired  Tobacco Use  . Smoking status: Never Smoker  .  Smokeless  tobacco: Never Used  . Tobacco comment: Married, lives with spouse, dtr 2 g-kids.  Vaping Use  . Vaping Use: Never used  Substance and Sexual Activity  . Alcohol use: No    Comment: rare  . Drug use: No  . Sexual activity: Not Currently    Comment: lives with husband, no dietary restrictions avoid foods allergic to, retired  Other Topics Concern  . Not on file  Social History Narrative   Right handed   One story with daughter and two grandkids   Social Determinants of Health   Financial Resource Strain: Low Risk   . Difficulty of Paying Living Expenses: Not hard at all  Food Insecurity: No Food Insecurity  . Worried About Charity fundraiser in the Last Year: Never true  . Ran Out of Food in the Last Year: Never true  Transportation Needs: No Transportation Needs  . Lack of Transportation (Medical): No  . Lack of Transportation (Non-Medical): No  Physical Activity:   . Days of Exercise per Week: Not on file  . Minutes of Exercise per Session: Not on file  Stress:   . Feeling of Stress : Not on file  Social Connections:   . Frequency of Communication with Friends and Family: Not on file  . Frequency of Social Gatherings with Friends and Family: Not on file  . Attends Religious Services: Not on file  . Active Member of Clubs or Organizations: Not on file  . Attends Archivist Meetings: Not on file  . Marital Status: Not on file  Intimate Partner Violence:   . Fear of Current or Ex-Partner: Not on file  . Emotionally Abused: Not on file  . Physically Abused: Not on file  . Sexually Abused: Not on file    PHYSICAL EXAM: Blood pressure (!) 176/76, pulse 87, height 4\' 11"  (1.499 m), weight 223 lb 9.6 oz (101.4 kg), SpO2 95 %. General: No acute distress.  Patient appears well-groomed.   Head:  Normocephalic/atraumatic Eyes:  Fundi examined but not visualized Neck: supple, no paraspinal tenderness, full range of motion Heart:  Regular rate and rhythm Lungs:   Clear to auscultation bilaterally Back: No paraspinal tenderness Neurological Exam: alert and oriented to person, place, and time. Attention span and concentration intact, recent and remote memory intact, fund of knowledge intact.  Speech fluent and not dysarthric, language intact.  CN II-XII intact. Bulk and tone normal, muscle strength 5/5 throughout.  Sensation to light touch, temperature and vibration intact.  Deep tendon reflexes 2+ throughout, toes downgoing.  Finger to nose and heel to shin testing intact.  Gait normal, Romberg negative.  IMPRESSION: 1.  Chronic daily headache, resolved 2.  Cough headache 3.  Chiari 1 malformation 4.  Cerebral ventriculomegaly 5.  OSA  6.  Elevated blood pressure reading.  She attributes to rushing to get here.  Usually 093A systolic.  PLAN: 1.  Follow up in 6 months. 2.  Monitor blood pressure  Metta Clines, DO  CC: Penni Homans, MD

## 2020-05-24 ENCOUNTER — Ambulatory Visit: Payer: Medicare HMO | Admitting: Neurology

## 2020-05-24 ENCOUNTER — Other Ambulatory Visit: Payer: Self-pay

## 2020-05-24 ENCOUNTER — Encounter: Payer: Self-pay | Admitting: Neurology

## 2020-05-24 VITALS — BP 176/76 | HR 87 | Ht 59.0 in | Wt 223.6 lb

## 2020-05-24 DIAGNOSIS — R519 Headache, unspecified: Secondary | ICD-10-CM

## 2020-05-24 DIAGNOSIS — G9389 Other specified disorders of brain: Secondary | ICD-10-CM | POA: Diagnosis not present

## 2020-05-24 DIAGNOSIS — R030 Elevated blood-pressure reading, without diagnosis of hypertension: Secondary | ICD-10-CM

## 2020-05-24 DIAGNOSIS — G935 Compression of brain: Secondary | ICD-10-CM

## 2020-05-24 DIAGNOSIS — G4733 Obstructive sleep apnea (adult) (pediatric): Secondary | ICD-10-CM

## 2020-05-24 NOTE — Patient Instructions (Signed)
Follow up in 6 months 

## 2020-05-24 NOTE — Telephone Encounter (Signed)
So did she stop the Amlodipine, Losartan and Spironolactone all? How does she feel. Her numbers are good so not worried about that. Update MAR and set her up for an RN appt in 3-4 weeks for a BP check

## 2020-05-25 NOTE — Telephone Encounter (Signed)
MAR updated

## 2020-05-25 NOTE — Telephone Encounter (Signed)
Unable to leave voicemail message, none has been set-up.  Will call back. 1st. Attempt.[place]

## 2020-05-25 NOTE — Addendum Note (Signed)
Addended by: Jeronimo Greaves on: 05/25/2020 11:42 AM   Modules accepted: Orders

## 2020-05-25 NOTE — Telephone Encounter (Signed)
Thank you :)

## 2020-05-25 NOTE — Telephone Encounter (Signed)
NV Scheduled for 9/22   Patient states she is only taking Spironolactone for her BP , and she checks her BP TID and has changed her diet and eats no salt.

## 2020-05-28 ENCOUNTER — Other Ambulatory Visit: Payer: Self-pay

## 2020-05-28 ENCOUNTER — Other Ambulatory Visit: Payer: Medicare HMO | Admitting: Occupational Therapy

## 2020-05-28 NOTE — Patient Outreach (Signed)
Aging Gracefully Program  OT Follow-Up Visit  05/28/2020  LETISHIA ELLIOTT [birth year] [id number]  Visit:  2- Second Visit  Start Time:  3009 End Time:  1700 Total Minutes:  30    Readiness to Change Score :  Readiness to Change Score: 10   Durable Medical Equipment: Adaptive Equipment: Other (step stool with handle) Adaptive Equipment Distribution Date: 05/28/20  Patient Education: Education Provided: Yes Education Details: Home safety handout and how to get up from a fall handout Person(s) Educated: Patient Comprehension: Verbalized Understanding  Goals:  Goals Addressed            This Visit's Progress   . COMPLETED: Patient Stated       Be independent with getting in and out of her kid's cars that are high ( a step stool with handle will help this)       Post Clinical Reasoning: Client Action (Goal) Four Interventions: Pt provided with a heavy duty step stool with handle so she can get in and out of her kids tall vehicles easier and safer Did Client Try?: Yes Targeted Problem Area Status: A Lot Better Clinician View Of Client Situation:: Ms. Crusoe is such a pleasant lady who is very aware of her health. She was very appreciative of her step stool and looks forward to uisng it to get into her kids SUVs and to use in the house some. She was able to pick it up with very little effort. We also went over the safety at home handout that I encouarged her to read more thoroughly and mark any questions she may have when reading it. We also went over how to get up from a fall handout--she did practice today since 3 of her family members were there. She looks forward to our next visit where she would like to have her rollator. Client View Of His/Her Situation:: Ms. Sanda is doing fairly well. She reports she has felt somewhat light headed this week, but today is a little better. She stated that she felt much more safe getting in and out of daughter's SUV and looks forward to  trying it out with her son's SUV which is much bigger. She was excited to introduce me to her family that was there (one of which I knew who used to work for [employer]). She looks forward to hearing from [place] soon about the home modifications (she has been intouch with him recently). Next Visit Plan:: Rollator education

## 2020-05-28 NOTE — Patient Instructions (Signed)
ACTION PLANNING - CUSTOM - (smartphrase AGOTCUSTOM) Target Problem Area: Getting in and out of higher vehicles  Why Problem May Occur:  Decreased height of participant    Target Goal: Safety and independence with getting in and out of higher vehicles-     STRATEGIES Modifying your home environment and making it safe: DO: DON'T:  Use step stool to get in and out of your kid's taller vehicles Don't struggle to get in car by just having to grab on anywhere  Hold onto handle of step stool with one hand and with the other hand grab something in car that does not move Don't hold onto open door when getting in and out of vehicle--it moves/is not stable           Practice It is important to practice the strategies so we can determine if they will be effective in helping to reach your goal. Follow these specific recommendations: 1.  Have someone else helps you carry to and from; as well as get step stool in and out of vehicles. 2. Use step stool consistently so you get used to it. 3. Each car you use it for will probably require you to problem solve the safest way getting in and out of if so that you are holding onto something that will move as you step up into and down from car.  If a strategy does not work the first time, try it again and again (and maybe again). We may make some changes over the next few sessions, based on how they work.  [place], OTR/L      05/28/2020

## 2020-05-30 ENCOUNTER — Other Ambulatory Visit: Payer: Self-pay | Admitting: Family Medicine

## 2020-05-30 ENCOUNTER — Other Ambulatory Visit: Payer: Self-pay | Admitting: Allergy

## 2020-05-30 DIAGNOSIS — J3089 Other allergic rhinitis: Secondary | ICD-10-CM

## 2020-06-07 ENCOUNTER — Telehealth: Payer: Self-pay | Admitting: Cardiology

## 2020-06-07 ENCOUNTER — Encounter: Payer: Self-pay | Admitting: Internal Medicine

## 2020-06-07 NOTE — Telephone Encounter (Signed)
Called patient about her message. Patient stated that her chest pain is not new, that this has been going on for a long time, but it has become more frequent the last couple of days. Patient stated she has lost her appetite as well. Patient stated she has been coughing a lot and has a lot of mucus build up which is nothing new and she is starting medication for. Patient denies being exposed to covid, patient stated she stays at home and wears a mask if out.  Patient's BP today 143/63  HR 51. Offered patient an appointment for tomorrow afternoon, but she has an appointment with her pulmonologist  that day and would not be able to make it. Encouraged patient to go to the ED if her symptoms get worse or more frequent. Will forward to Dr. Meda Coffee for further advisement.

## 2020-06-07 NOTE — Telephone Encounter (Signed)
Pt c/o of Chest Pain: STAT if CP now or developed within 24 hours  1. Are you having CP right now? No   2. Are you experiencing any other symptoms (ex. SOB, nausea, vomiting, sweating)? Fatigue, left neck pain, chocking sensation, mucus build up on left side, and headache she is not sure is related   3. How long have you been experiencing CP? Since Friday, maybe even before   4. Is your CP continuous or coming and going? Coming and going   5. Have you taken Nitroglycerin? No    Anne Barnes is calling stating she has been having "heart pain" more so then what she would call CP. She states she is also having symptoms of fatigue, left neck pain that she believes is giving her a headache, as well as a chocking sensation that she believes is occurring due to mucus build up on the left side of her throat. She states the last time this "heart pain" occurred was sometime this morning and it only ever last for less than a minute, coming and going. She also reports she wakes up at night sometimes with a mouth full of mucus. Please advise.

## 2020-06-07 NOTE — Telephone Encounter (Signed)
Made patient an appointment with Dr. Lovena Le, DOD, tomorrow, per Dr. Francesca Oman request.

## 2020-06-07 NOTE — Telephone Encounter (Signed)
Can you arrange a clinic visit this week for her? Thank you

## 2020-06-08 ENCOUNTER — Ambulatory Visit: Payer: Medicare HMO | Admitting: Internal Medicine

## 2020-06-08 ENCOUNTER — Encounter: Payer: Self-pay | Admitting: Internal Medicine

## 2020-06-08 ENCOUNTER — Other Ambulatory Visit: Payer: Self-pay

## 2020-06-08 DIAGNOSIS — J4530 Mild persistent asthma, uncomplicated: Secondary | ICD-10-CM

## 2020-06-08 DIAGNOSIS — G4733 Obstructive sleep apnea (adult) (pediatric): Secondary | ICD-10-CM | POA: Diagnosis not present

## 2020-06-08 DIAGNOSIS — R079 Chest pain, unspecified: Secondary | ICD-10-CM | POA: Diagnosis not present

## 2020-06-08 MED ORDER — ALBUTEROL SULFATE HFA 108 (90 BASE) MCG/ACT IN AERS
INHALATION_SPRAY | RESPIRATORY_TRACT | 12 refills | Status: DC
Start: 2020-06-08 — End: 2021-07-18

## 2020-06-08 MED ORDER — SIMETHICONE 80 MG PO CHEW: 80.0000 mg | CHEWABLE_TABLET | Freq: Four times a day (QID) | ORAL | 0 refills | Status: DC | PRN

## 2020-06-08 NOTE — Progress Notes (Signed)
HPI female never smoker followed for rhinitis/allergies, asthma, OSA, CHF, HTN, GERD, IBS, Peanut and cat cause coughing and we advised her to avoid them Allergy profile 03/11/14- positive especially for dust, cat and dog Food allergy profile- total IgE 86 with several food group elevations Sed rate-WNL Office Spirometry 11/07/17-WNL-FVC 1.58/82%, FEV1 1.27/85%, ratio 0.80, FEF 25-75% 0.26/89% HST 12/05/2017-AHI 12.3/hour, desaturation to 82%, body weight 226 pounds ----------------------------------------------------------------------------------------  12/07/19-  74 year old female never smoker followed for rhinitis/allergies, asthma, OSA, CHF, HTN, GERD, IBS, CPAP auto 5-20/ [employer] Download compliance 100%, AHI 2.3/ hr Body weight today  214 lbs Televist in Feb with NP. Had seen Dr Erik Obey ENT for sinus complaints, w CT sinus reported clear. NP treated with Zpak, prednisone. Albuterol hfa, Flonase, Xyzal,  -----f/u OSA/ Asthma mild  Husband died this winter Daughter and grandchildren now living with her. Post nasal drip now makes her queasy. Drinking liquids makes her cough.Using saline rinse, flonase, xyzal. Orthostatic dizzy if stands too quickly. Being evaluated. Now has rhythm monitor L chest.  06/08/20-  74 year old female never smoker followed for Rhinitis/Allergies, Asthma, OSA,  Complicated by CHF, HTN, GERD, IBS, Chiari Malformation,  CPAP auto 5-15/ [employer]/ [employer] Download- compliance 63%, AHI 2.6/ hr   Many short nights Body weight today- 223 lbs Covid vax- 2 Moderna -----asthma,osa, mucus build up causes sob, using cpap nightly, denies cpap problems Singulair, Ventolin hfa, Xhance nasal or Flonase, Astelin,  Her allergist is about to start allergy shots for "Mucus draining dowwn the left side of throat". She describes choking when swallowing water, which brings up clear mucus. Need to watch for LPR/ penetration. Takes off her CPAP in her sleep. If up to bathjroom she puts  it back on. Denies physically restless sleep. Wears CPAP for daily nap. Noted BP up on arrival 160/72. Says she was in hurry and skipped meds. Has cardiology appt this afternoon.  CT chest 01/26/20-  IMPRESSION: 1. Right lower lobe opacities noted on prior exam are not visualized currently, most consistent with resolved inflammation. 2. Mild coronary artery calcifications are noted suggesting coronary artery disease. 3. Small sliding-type hiatal hernia. 4. Bilateral thyroid nodules are noted, the largest measuring 1.3 cm on the right and 11 mm on the left. Not clinically significant; no follow-up imaging recommended. (Ref: J Am Coll Radiol. 2015 Feb;12(2): 143-50). Aortic Atherosclerosis (ICD10-I70.0).  ROS-see HPI  + = positive Constitutional:   No-   weight loss, night sweats, fevers, chills, fatigue, lassitude. HEENT:   +headaches, difficulty swallowing, tooth/dental problems, sore throat,       No-  sneezing, itching, ear ache, nasal congestion, +post nasal drip,  CV:  No-   chest pain, orthopnea, PND, swelling in lower extremities, anasarca,  +dizziness, palpitations Resp: +shortness of breath with exertion or at rest.              productive cough,  No non-productive cough,  No- coughing up of blood.              No-   change in color of mucus.  No- wheezing.   Skin: No-   rash or lesions. GI:  No-   heartburn, indigestion, abdominal pain, nausea, vomiting,  GU: . MS:  No-   joint pain or swelling.   Neuro-     nothing unusual Psych:  No- change in mood or affect. No depression or anxiety.  No memory loss.  OBJ- Physical Exam General- Alert, Oriented, Affect-appropriate, Distress- none acute, + obese Skin- rash-none, lesions- none, excoriation-  none Lymphadenopathy- none Head- atraumatic            Eyes- Gross vision intact, PERRLA, conjunctivae and secretions clear            Ears- Hearing, canals-normal            Nose- + turbinate edema, no-Septal dev, mucus, polyps,  erosion, perforation             Throat- Mallampati III-IV , mucosa clear , drainage- none, tonsils- atrophic, +Dentures Neck- flexible , trachea midline, no stridor , thyroid nl, carotid no bruit Chest - symmetrical excursion , unlabored           Heart/CV- RRR , no murmur , no gallop  , no rub, nl s1 s2                           - JVD- none , edema- none, stasis changes- none, varices- none           Lung- clear to P&A, wheeze-none, cough none, dullness-none, rub- none           Chest wall-  Abd- Br/ Gen/ Rectal- Not done, not indicated Extrem- cyanosis- none, clubbing, none, atrophy- none, strength- nl Neuro- grossly intact to observation

## 2020-06-08 NOTE — Assessment & Plan Note (Signed)
Benefits from CPAP when used. Needs to pay more attention to compliance goals, and ask family to watch when asleep for parasomniac movement or confusional arousals when she may pull mas off. Reviewed compliance goals.

## 2020-06-08 NOTE — Assessment & Plan Note (Addendum)
Needs albuterol rescue inhaler refill- done with discussion. Watch for triggers. Watch for possibility her c/o post nasal drip could actually be due to CHF or reflux.

## 2020-06-08 NOTE — Patient Instructions (Addendum)
Medication Instructions:  Your physician has recommended you make the following change in your medication:  1.  Start taking simethicone 80 mg-  Chew 1 tablet (80 mg total) by mouth every 6 (six) hours as needed for flatulence  *If you need a refill on your cardiac medications before your next appointment, please call your pharmacy*  Lab Work: None ordered.  If you have labs (blood work) drawn today and your tests are completely normal, you will receive your results only by: Marland Kitchen MyChart Message (if you have MyChart) OR . A paper copy in the mail If you have any lab test that is abnormal or we need to change your treatment, we will call you to review the results.   Testing/Procedures: None ordered.  Follow-Up: At [facility], you and your health needs are our priority.  As part of our continuing mission to provide you with exceptional heart care, we have created designated Provider Care Teams.  These Care Teams include your primary Cardiologist (physician) and Advanced Practice Providers (APPs -  Physician Assistants and Nurse Practitioners) who all work together to provide you with the care you need, when you need it.   Your next appointment:   August 04, 2020 at 9:20 am  The format for your next appointment:   In Person  Provider:   Ena Dawley, MD

## 2020-06-08 NOTE — Patient Instructions (Addendum)
We can continue CPAP 5-15  I hope you can find ways to sleep a little more with your CPAP on, at night or with your naps.  Ok to try an otc sleep aid occasionally to see if that helps.  Plan on getting your flu vaccine this Fall.  Albuterol inhaler refill sent to drug store  Please call if we can help

## 2020-06-08 NOTE — Progress Notes (Signed)
HPI Anne Barnes presents today for evaluation of chest pain. She is a pleasant 74 yo woman with a h/o HTN, sinus bradycardia on beta blockers, dyslipidemia, and chest pain. She had a coronary CT scan with up to a 25-49% stenosis. She notes that over the weekend, her bp was up and she was belching much more than usual. Her chest pain is non-exertional. She has not had syncope or peripheral edema. No abdominal pain. No nausea or vomiting.  Allergies  Allergen Reactions  . Codeine Nausea And Vomiting  . Hydralazine Hcl Other (See Comments)    Pt reports causes dizziness  . Lisinopril-Hydrochlorothiazide Hives    REACTION: lip swelling,facial swelling,rash.  . Metoprolol Other (See Comments)    Does not feel well on it   . Other Cough    Patient states she is allergic to Shrimp, egg whites, tomatoes and dairy products.  Says they make her feel unwell but do not cause hives, SOB or anaphylaxis.  She just avoids eating them because they can make her have a headache.  Strawberries, watermelon, peanuts, and all shellfish.    . Shrimp [Shellfish Allergy] Other (See Comments)    Was allergy tested and showed shrimp was one--has not reacted b/c avoided  . Tomato Other (See Comments)     Current Outpatient Medications  Medication Sig Dispense Refill  . albuterol (VENTOLIN HFA) 108 (90 Base) MCG/ACT inhaler INHALE 2 PUFFS INTO THE LUNGS EVERY 6 HOURS AS NEEDED FOR WHEEZING FOR SHORTNESS OF BREATH 18 g 12  . ALPRAZolam (XANAX) 0.5 MG tablet TAKE 1 TABLET BY MOUTH TWICE DAILY AS NEEDED FOR ANXIETY AND FOR SLEEP 20 tablet 0  . azelastine (ASTELIN) 0.1 % nasal spray Place 2 sprays into both nostrils 2 (two) times daily. 30 mL 5  . b complex vitamins tablet Take 1 tablet by mouth daily.    . Cholecalciferol (VITAMIN D) 2000 UNITS CAPS Take by mouth daily.     . fluticasone (FLONASE) 50 MCG/ACT nasal spray Place 2 sprays into both nostrils daily. 16 g 6  . Fluticasone Propionate (XHANCE) 93 MCG/ACT  EXHU Place 2 sprays into the nose in the morning and at bedtime. 32 mL 3  . levocetirizine (XYZAL) 5 MG tablet TAKE 1 TABLET BY MOUTH ONCE DAILY IN THE EVENING 30 tablet 5  . montelukast (SINGULAIR) 10 MG tablet TAKE 1 TABLET BY MOUTH AT BEDTIME 90 tablet 0  . Multiple Vitamin (MULTIVITAMIN) tablet Take 1 tablet by mouth daily.      Marland Kitchen olopatadine (PATANOL) 0.1 % ophthalmic solution Place 1 drop into both eyes 2 (two) times daily. 5 mL 12  . omeprazole (PRILOSEC) 20 MG capsule Take 1 capsule by mouth once daily 90 capsule 0  . rosuvastatin (CRESTOR) 10 MG tablet Take 1 tablet (10 mg total) by mouth daily. 90 tablet 2  . spironolactone (ALDACTONE) 25 MG tablet Take 1/2 (one-half) tablet by mouth once daily 30 tablet 5  . tiZANidine (ZANAFLEX) 2 MG tablet TAKE 1/2 TO 2 TABLETS BY MOUTH TWICE DAILY AS NEEDED FOR MUSCLE SPASM 30 tablet 0  . topiramate (TOPAMAX) 25 MG tablet Take 1 tablet (25 mg total) by mouth at bedtime. 30 tablet 3   No current facility-administered medications for this visit.     Past Medical History:  Diagnosis Date  . Acute combined systolic and diastolic heart failure ([place]) 05/26/2015  . ALLERGIC RHINITIS   . Allergic urticaria 08/01/2014   Patient reports allergies to shrimp,  egg whites, tomatoes, dairy   . 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   Office Spirometry 11/07/17-WNL-FVC 1.58/82%, FEV1 1.27/85%, ratio 0.80, FEF 25-75% 0.26/89%  . Bradycardia 02/25/2017  . Chiari malformation    Noted MRI brain 09/2013 - s/p neuro eval for same  . DIVERTICULOSIS, COLON   . Essential hypertension 08/28/2010   Qualifier: Diagnosis of  By: Marca Ancona RMA, Lucy     . Frequent headaches   . GERD   . H/O measles   . H/O mumps   . Hearing loss 01/18/2017  . History of chicken pox   . Hyperglycemia 03/05/2016  . HYPERTENSION   . Hypocalcemia 02/25/2017  . IBS (irritable bowel syndrome) 05/26/2015  . Nonspecific abnormal electrocardiogram (ECG) (EKG)   . OBSTRUCTIVE SLEEP APNEA 12/2008 dx    noncompliant with CPAP qhs  . Obstructive sleep apnea 08/28/2010   NPSG 12/2008:  AHI 13/hr with desats to 78%   . Preventative health care 06/11/2017  . Sciatica    right side  . Seasonal and perennial allergic rhinitis 08/28/2010   Allergy profile 03/11/14- positive especially for dust, cat and dog Food allergy profile- total IgE 86 with several food group elevations Sed rate-WNL     . Sinusitis 06/06/2017  . 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 Vaginitis 01/18/2017  . Vitamin D deficiency 06/11/2017    ROS:   All systems reviewed and negative except as noted in the HPI.   Past Surgical History:  Procedure Laterality Date  . ABDOMINAL HYSTERECTOMY  1986  . BREAST SURGERY  1973   Breast biopsy  . UMBILICAL HERNIA REPAIR       Family History  Problem Relation Age of Onset  . Arthritis Mother        died of complications from hip surgery  . Arthritis Father   . Kidney disease Father   . Pneumonia Father   . Heart disease Father   . Lupus Daughter   . Rheum arthritis Daughter   . Sjogren's syndrome Daughter   . Fibromyalgia Daughter   . Diabetes Brother   . Heart disease Brother   . Heart attack Brother   . Alcohol abuse Brother   . Throat cancer Brother   . Lung cancer Brother   . Kidney disease Sister        dialysis  . Obesity Sister   . Hypertension Maternal Grandfather   . Hypertension Sister   . Kidney disease Sister        dialysis  . Pneumonia Brother   . Hypertension Daughter   . Graves' disease Daughter   . Hypertension Daughter   . Alcohol abuse Other        parent  . Ataxia Neg Hx   . Chorea Neg Hx   . Dementia Neg Hx   . Mental retardation Neg Hx   . Migraines Neg Hx   . Multiple sclerosis Neg Hx   . Neurofibromatosis Neg Hx   . Neuropathy Neg Hx   . Parkinsonism Neg Hx   . Seizures Neg Hx   . Stroke Neg Hx      Social History   Socioeconomic History  . Marital status: Widowed    Spouse name: Not on file  . Number of children: Not on file   . Years of education: Not on file  . Highest education level: Not on file  Occupational History  . Occupation: retired  Tobacco Use  . Smoking status: Never Smoker  . Smokeless tobacco: Never Used  .  Tobacco comment: Married, lives with spouse, dtr 2 g-kids.  Vaping Use  . Vaping Use: Never used  Substance and Sexual Activity  . Alcohol use: No    Comment: rare  . Drug use: No  . Sexual activity: Not Currently    Comment: lives with husband, no dietary restrictions avoid foods allergic to, retired  Other Topics Concern  . Not on file  Social History Narrative   Right handed   One story with daughter and two grandkids   Social Determinants of Health   Financial Resource Strain: Low Risk   . Difficulty of Paying Living Expenses: Not hard at all  Food Insecurity: No Food Insecurity  . Worried About Charity fundraiser in the Last Year: Never true  . Ran Out of Food in the Last Year: Never true  Transportation Needs: No Transportation Needs  . Lack of Transportation (Medical): No  . Lack of Transportation (Non-Medical): No  Physical Activity:   . Days of Exercise per Week: Not on file  . Minutes of Exercise per Session: Not on file  Stress:   . Feeling of Stress : Not on file  Social Connections:   . Frequency of Communication with Friends and Family: Not on file  . Frequency of Social Gatherings with Friends and Family: Not on file  . Attends Religious Services: Not on file  . Active Member of Clubs or Organizations: Not on file  . Attends Archivist Meetings: Not on file  . Marital Status: Not on file  Intimate Partner Violence:   . Fear of Current or Ex-Partner: Not on file  . Emotionally Abused: Not on file  . Physically Abused: Not on file  . Sexually Abused: Not on file     BP (!) 146/72   Pulse 81   Ht 4\' 11"  (1.499 m)   Wt 222 lb 12.8 oz (101.1 kg)   SpO2 97%   BMI 45.00 kg/m   Physical Exam:  Well appearing NAD HEENT: Unremarkable Neck:   No JVD, no thyromegally Lymphatics:  No adenopathy Back:  No CVA tenderness Lungs:  Clear with no wheezes HEART:  Regular rate rhythm, no murmurs, no rubs, no clicks Abd:  soft, positive bowel sounds, no organomegally, no rebound, no guarding Ext:  2 plus pulses, no edema, no cyanosis, no clubbing Skin:  No rashes no nodules Neuro:  CN II through XII intact, motor grossly intact  EKG - nsr with no acute STT changes   Assess/Plan: 1. Chest pain - she has had this before. She notes that she has had a lot of gas and is belching. I have recommended a trial of gas-ex. 2. CAD - she has non-obstructive disease by CT scanning. I would not work up any more unless her chest pain were to change. 3. Obesity - she needs to lose weight. 4. HTN - her bp is better today. Was running high this past weekend. I encouraged her to avoid salty foods.  Carleene Overlie Pernie Grosso,MD

## 2020-06-09 ENCOUNTER — Telehealth: Payer: Self-pay | Admitting: Family Medicine

## 2020-06-13 ENCOUNTER — Ambulatory Visit (INDEPENDENT_AMBULATORY_CARE_PROVIDER_SITE_OTHER): Payer: Medicare HMO

## 2020-06-13 ENCOUNTER — Encounter: Payer: Self-pay | Admitting: *Deleted

## 2020-06-13 ENCOUNTER — Other Ambulatory Visit: Payer: Self-pay | Admitting: *Deleted

## 2020-06-13 ENCOUNTER — Other Ambulatory Visit: Payer: Self-pay

## 2020-06-13 DIAGNOSIS — J309 Allergic rhinitis, unspecified: Secondary | ICD-10-CM

## 2020-06-13 MED ORDER — EPINEPHRINE 0.3 MG/0.3ML IJ SOAJ: 0.3000 mg | INTRAMUSCULAR | 1 refills | Status: DC | PRN

## 2020-06-13 NOTE — Addendum Note (Signed)
Addended by: Rose Phi on: 06/13/2020 05:20 PM   Modules accepted: Orders

## 2020-06-13 NOTE — Progress Notes (Signed)
Immunotherapy   Patient Details  Name: Anne Barnes MRN: [id number] Date of Birth: 25-Oct-1945  06/13/2020  Anne Barnes started injections for  MITES-PET Following schedule: B  Frequency:1 time per week Epi-Pen: Prescription sent to requested pharmacy.  Patient waited 30 minutes in office post injection with no local reaction or systemic symptoms.  Consent signed and patient instructions given.   Rosalio Loud 06/13/2020, 10:32 AM

## 2020-06-13 NOTE — Patient Outreach (Signed)
Aging Gracefully Program  RN Visit  06/13/2020  Anne Barnes 12-21-1945 [id number]  Visit:   Aging Gracefully RN Visit # 2- by phone  Start Time:   12:35 pm End Time:   12:55 pm Total Minutes:   20  Universal RN Interventions: Calendar Distribution: Yes (Reviewed with client today) Exercise Review: Yes (Reviewed with client today) Medications: Yes (Reviewed with client today) Medication Changes: Yes (Reviewed with client today) PCP Advocacy/Support: Yes (Reviewed with client today) Fall Prevention: Yes (Reviewed with client today) Incontinence: Yes (Reviewed with client today)  Healthcare Provider Communication: Did Higher education careers adviser With [place] Provider?: No  Clinician View of Client Situation: Clinician View Of Client Situation: Anne Barnes continues to be on track in meeting her previously established Aging Gracefully goals around fall prevention; she has contacted her PCP to explore options arund blood pressure control and is experiencing less light-headedness since changes to her medications have been recommended/ put in place.  She monitors her blood pressure twice a day and is looking forward to talking to her doctor about blood pressure control options later this week.  She is using her health calendar and performing the Aging Gracefully exercises regularly. She is looking forward to the [facility] team home modifications, which are currently pending.   Client's View of His/Her Situation: Ambulance person Of His/Her Situation: "i contacted my doctor about my light-headedness and we stopped the blood pressure medications for now- I am no longer experiencing the lightheadedness like I was before-- it has almost completely gone away and only happens now on rare occasions.  I fel so much better being off the medication and I have an appointment to talk to my PCP about it on wednesday of this week.  I have been using the step stool Tye Maryland gave me and it is so helpful-- it makes getting in and  out of the cars so much easier.  I have been doing the exercises you showed me several times a week, sometimes every day and they are really helping too.  My family is still keeping me on track and they make sure that there is no clutter in my house; I have not had any falls, and all of this information and ideas are so helpful.  I look forward to the [facility] team starting to make some progress on the home modifications."  Medication Assessment: Reports currently off of blood pressure medications, with PCP office visit scheduled Wednesday 06/15/20 to discuss blood pressure management and review client's blood pressures at home   OT Update: Will update Aging Gracefully team on successful completion of RN Visit # 2 today  Session Summary: Anne Barnes is making great progress in meeting her previously established Aging Gracefully goals around fall prevention and has experienced no new/ recent falls; she is finding the OT/ nursing interventions very helpful in meeting her goals and is looking forward to home repairs/ modifications to start being conmpleted.  Together we brainstormed around fall prevention strategies at her home and scheduled our next Aging Gracefully RN visit for July 21, 2020 at her home.  Pleasant phone visit with very engaged client.  [name], RN, BSN, [employer] [facility] Care Management  [phone]

## 2020-06-14 ENCOUNTER — Ambulatory Visit
Admission: EM | Admit: 2020-06-14 | Discharge: 2020-06-14 | Disposition: A | Discharge status: Home / Self Care | Payer: Medicare HMO | Attending: Emergency Medicine | Admitting: Emergency Medicine

## 2020-06-14 DIAGNOSIS — M542 Cervicalgia: Secondary | ICD-10-CM | POA: Diagnosis not present

## 2020-06-14 MED ORDER — TIZANIDINE HCL 2 MG PO TABS: ORAL_TABLET | ORAL | 0 refills | Status: AC

## 2020-06-14 NOTE — ED Provider Notes (Signed)
[facility]    CSN: [id number] Arrival date & time: 06/14/20  1324      History   Chief Complaint Chief Complaint  Patient presents with  . Neck Pain    HPI Anne Barnes is a 75 y.o. female  With extensive medical history as outlined below presenting for left-sided neck pain.  Patient states this has been intermittent, waxing and waning between left and right side for the last year.  Followed by her PCP for this.  Recently began allergy shots, tolerated well, due to severe allergies.  Patient states that sometimes she will get postnasal drip.  Uses CPAP: Unsure if there is humidifier.  No change in diet, lifestyle, medications recently.  No fever, thrashes, chest pain, palpitations, increased dyspnea, lower leg swelling.  No difficulty swallowing, choking, drooling, increased reflux.  Past Medical History:  Diagnosis Date  . Acute combined systolic and diastolic heart failure ([place]) 05/26/2015  . ALLERGIC RHINITIS   . Allergic urticaria 08/01/2014   Patient reports allergies to shrimp, egg whites, tomatoes, dairy   . 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   Office Spirometry 11/07/17-WNL-FVC 1.58/82%, FEV1 1.27/85%, ratio 0.80, FEF 25-75% 0.26/89%  . Bradycardia 02/25/2017  . Chiari malformation    Noted MRI brain 09/2013 - s/p neuro eval for same  . DIVERTICULOSIS, COLON   . Essential hypertension 08/28/2010   Qualifier: Diagnosis of  By: Marca Ancona RMA, Lucy     . Frequent headaches   . GERD   . H/O measles   . H/O mumps   . Hearing loss 01/18/2017  . History of chicken pox   . Hyperglycemia 03/05/2016  . HYPERTENSION   . Hypocalcemia 02/25/2017  . IBS (irritable bowel syndrome) 05/26/2015  . Nonspecific abnormal electrocardiogram (ECG) (EKG)   . OBSTRUCTIVE SLEEP APNEA 12/2008 dx   noncompliant with CPAP qhs  . Obstructive sleep apnea 08/28/2010   NPSG 12/2008:  AHI 13/hr with desats to 78%   . Preventative health care 06/11/2017  . Sciatica    right side  . Seasonal and  perennial allergic rhinitis 08/28/2010   Allergy profile 03/11/14- positive especially for dust, cat and dog Food allergy profile- total IgE 86 with several food group elevations Sed rate-WNL     . Sinusitis 06/06/2017  . 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 Vaginitis 01/18/2017  . Vitamin D deficiency 06/11/2017    Patient Active Problem List   Diagnosis Date Noted  . Chest pain of uncertain etiology 63/84/6659  . Environmental allergies 05/01/2020  . Allergies 12/22/2019  . Dizziness 11/19/2019  . Grief reaction 08/27/2019  . Left shoulder pain 08/27/2019  . Hyperlipidemia 07/14/2018  . Asthma, mild intermittent 06/06/2018  . Obesity 01/09/2018  . Vitamin D deficiency 06/11/2017  . Preventative health care 06/11/2017  . Hypocalcemia 02/25/2017  . Bradycardia 02/25/2017  . Hearing loss 01/18/2017  . Hyperglycemia 03/05/2016  . 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  . Acute combined systolic and diastolic heart failure ([place]) 05/26/2015  . Female bladder prolapse 05/26/2015  . IBS (irritable bowel syndrome) 05/26/2015  . History of chicken pox   . Allergic urticaria 08/01/2014  . Food allergy 08/01/2014  . Tinnitus 03/14/2014  . Osteoarthritis   . Nonspecific abnormal electrocardiogram (ECG) (EKG)   . Headache 10/21/2013  . Chiari malformation   . GERD 11/10/2010  . Obstructive sleep apnea 08/28/2010  . Essential hypertension 08/28/2010  . Seasonal and perennial allergic rhinitis 08/28/2010  . DIVERTICULOSIS, COLON 08/28/2010    Past Surgical History:  Procedure Laterality Date  . ABDOMINAL HYSTERECTOMY  1986  . BREAST SURGERY  1973   Breast biopsy  . UMBILICAL HERNIA REPAIR      OB History   No obstetric history on file.      Home Medications    Prior to Admission medications   Medication Sig Start Date End Date Taking? Authorizing Provider  albuterol (VENTOLIN HFA) 108 (90 Base) MCG/ACT inhaler INHALE 2 PUFFS INTO THE LUNGS EVERY 6 HOURS AS NEEDED FOR WHEEZING FOR  SHORTNESS OF BREATH 06/08/20   Young, Kasandra Knudsen, MD  ALPRAZolam Duanne Moron) 0.5 MG tablet TAKE 1 TABLET BY MOUTH TWICE DAILY AS NEEDED FOR ANXIETY AND FOR SLEEP 12/09/19   Mosie Lukes, MD  azelastine (ASTELIN) 0.1 % nasal spray Place 2 sprays into both nostrils 2 (two) times daily. 01/28/20   Kennith Gain, MD  b complex vitamins tablet Take 1 tablet by mouth daily.    [provider]  Cholecalciferol (VITAMIN D) 2000 UNITS CAPS Take by mouth daily.     [provider]  EPINEPHrine 0.3 mg/0.3 mL IJ SOAJ injection Inject 0.3 mg into the muscle as needed for anaphylaxis. 06/13/20   Garnet Sierras, DO  fluticasone (FLONASE) 50 MCG/ACT nasal spray Place 2 sprays into both nostrils daily. 06/19/19   Mosie Lukes, MD  Fluticasone Propionate Truett Perna) 93 MCG/ACT EXHU Place 2 sprays into the nose in the morning and at bedtime. 01/28/20   Kennith Gain, MD  levocetirizine (XYZAL) 5 MG tablet TAKE 1 TABLET BY MOUTH ONCE DAILY IN THE EVENING 05/31/20   Kennith Gain, MD  montelukast (SINGULAIR) 10 MG tablet TAKE 1 TABLET BY MOUTH AT BEDTIME 05/31/20   Mosie Lukes, MD  Multiple Vitamin (MULTIVITAMIN) tablet Take 1 tablet by mouth daily.      [provider]  olopatadine (PATANOL) 0.1 % ophthalmic solution Place 1 drop into both eyes 2 (two) times daily. 12/07/19   Deneise Lever, MD  omeprazole (PRILOSEC) 20 MG capsule Take 1 capsule by mouth once daily 05/20/19   Mosie Lukes, MD  rosuvastatin (CRESTOR) 10 MG tablet Take 1 tablet (10 mg total) by mouth daily. 01/18/20   Dorothy Spark, MD  simethicone (MYLICON) 80 MG chewable tablet Chew 1 tablet (80 mg total) by mouth every 6 (six) hours as needed for flatulence. 06/08/20   Evans Lance, MD  spironolactone (ALDACTONE) 25 MG tablet Take 1/2 (one-half) tablet by mouth once daily 10/07/19   Nahser, Wonda Cheng, MD  tiZANidine (ZANAFLEX) 2 MG tablet TAKE 1/2 TO 2 TABLETS BY MOUTH TWICE DAILY AS NEEDED FOR  MUSCLE SPASM 06/14/20 06/16/20  Hall-Potvin, [place], PA-C  topiramate (TOPAMAX) 25 MG tablet Take 1 tablet (25 mg total) by mouth at bedtime. 01/13/20   Pieter Partridge, DO    Family History Family History  Problem Relation Age of Onset  . Arthritis Mother        died of complications from hip surgery  . Arthritis Father   . Kidney disease Father   . Pneumonia Father   . Heart disease Father   . Lupus Daughter   . Rheum arthritis Daughter   . Sjogren's syndrome Daughter   . Fibromyalgia Daughter   . Diabetes Brother   . Heart disease Brother   . Heart attack Brother   . Alcohol abuse Brother   . Throat cancer Brother   . Lung cancer Brother   . Kidney disease Sister  dialysis  . Obesity Sister   . Hypertension Maternal Grandfather   . Hypertension Sister   . Kidney disease Sister        dialysis  . Pneumonia Brother   . Hypertension Daughter   . Graves' disease Daughter   . Hypertension Daughter   . Alcohol abuse Other        parent  . Ataxia Neg Hx   . Chorea Neg Hx   . Dementia Neg Hx   . Mental retardation Neg Hx   . Migraines Neg Hx   . Multiple sclerosis Neg Hx   . Neurofibromatosis Neg Hx   . Neuropathy Neg Hx   . Parkinsonism Neg Hx   . Seizures Neg Hx   . Stroke Neg Hx     Social History Social History   Tobacco Use  . Smoking status: Never Smoker  . Smokeless tobacco: Never Used  . Tobacco comment: Married, lives with spouse, dtr 2 g-kids.  Vaping Use  . Vaping Use: Never used  Substance Use Topics  . Alcohol use: No    Comment: rare  . Drug use: No     Allergies   Codeine, Hydralazine hcl, Lisinopril-hydrochlorothiazide, Metoprolol, Other, Shrimp [shellfish allergy], and Tomato   Review of Systems As per HPI   Physical Exam Triage Vital Signs ED Triage Vitals [06/14/20 1421]  Enc Vitals Group     BP (!) 174/87     Pulse Rate 77     Resp 18     Temp 98.5 F (36.9 C)     Temp Source Oral     SpO2 95 %     Weight       Height      Head Circumference      Peak Flow      Pain Score 0     Pain Loc      Pain Edu?      Excl. in [place]?    No data found.  Updated Vital Signs BP (!) 174/87 (BP Location: Right Arm)   Pulse 77   Temp 98.5 F (36.9 C) (Oral)   Resp 18   SpO2 95%   Visual Acuity Right Eye Distance:   Left Eye Distance:   Bilateral Distance:    Right Eye Near:   Left Eye Near:    Bilateral Near:     Physical Exam Constitutional:      General: She is not in acute distress.    Appearance: She is obese. She is not toxic-appearing.  HENT:     Head: Normocephalic and atraumatic.     Right Ear: Tympanic membrane and ear canal normal.     Left Ear: Tympanic membrane and ear canal normal.     Mouth/Throat:     Mouth: Mucous membranes are moist.     Pharynx: Oropharynx is clear.  Eyes:     General: No scleral icterus.    Pupils: Pupils are equal, round, and reactive to light.  Neck:     Comments: Negative for thyromegaly, nodules, masses, crepitus or edema bilaterally Cardiovascular:     Rate and Rhythm: Normal rate and regular rhythm.  Pulmonary:     Effort: Pulmonary effort is normal. No respiratory distress.     Breath sounds: No wheezing.  Musculoskeletal:     Cervical back: Normal range of motion and neck supple. No tenderness.  Lymphadenopathy:     Cervical: No cervical adenopathy.  Skin:    Coloration: Skin is not jaundiced or  pale.  Neurological:     Mental Status: She is alert and oriented to person, place, and time.      UC Treatments / Results  Labs (all labs ordered are listed, but only abnormal results are displayed) Labs Reviewed - No data to display  EKG   Radiology No results found.  Procedures Procedures (including critical care time)  Medications Ordered in UC Medications - No data to display  Initial Impression / Assessment and Plan / UC Course  I have reviewed the triage vital signs and the nursing notes.  Pertinent labs & imaging results  that were available during my care of the patient were reviewed by me and considered in my medical decision making (see chart for details).     Patient febrile, nontoxic, appears well.  Issue has been ongoing greater than 1 year, and she is currently being followed by PCP & specialist.  No change in therapies at this time, will keep follow-ups as scheduled.  Requesting low-dose muscle relaxer as this has helped in the past: Provided.  Return precautions discussed, pt verbalized understanding and is agreeable to plan. Final Clinical Impressions(s) / UC Diagnoses   Final diagnoses:  Neck pain on left side     Discharge Instructions     Continue allergy medication and nasal spray. Keep follow up appointment with PCP.    ED Prescriptions    Medication Sig Dispense Auth. Provider   tiZANidine (ZANAFLEX) 2 MG tablet TAKE 1/2 TO 2 TABLETS BY MOUTH TWICE DAILY AS NEEDED FOR MUSCLE SPASM 6 tablet Hall-Potvin, [place], PA-C     I have reviewed the PDMP during this encounter.   Hall-Potvin, [place], [place] 06/14/20 1516

## 2020-06-14 NOTE — ED Triage Notes (Signed)
Pt present neck pain,symptoms started a year ago but has been off and on. Pt states that she can feel fluid build up on the left side of her throat.

## 2020-06-14 NOTE — Discharge Instructions (Signed)
Continue allergy medication and nasal spray. Keep follow up appointment with PCP.

## 2020-06-15 ENCOUNTER — Ambulatory Visit (INDEPENDENT_AMBULATORY_CARE_PROVIDER_SITE_OTHER): Payer: Medicare HMO

## 2020-06-15 ENCOUNTER — Other Ambulatory Visit: Payer: Self-pay

## 2020-06-15 VITALS — BP 138/76 | HR 72

## 2020-06-15 DIAGNOSIS — I10 Essential (primary) hypertension: Secondary | ICD-10-CM

## 2020-06-15 NOTE — Progress Notes (Addendum)
Patient here Blood Pressure check per Dr. Charlett Blake. Patient currently takes:  Spironolactone 1/2 25  mg. Tablet daily. Patient reports compliance with medication. BP today@ 9:45 am=138/76 Pulse=72 Patient advised per [place] to follow-up 4 months with Dr. Charlett Blake.   Reviewed and agree Denny Peon MD  BP Readings from Last 3 Encounters:  06/14/20 (!) 174/87  06/08/20 (!) 146/72  06/08/20 (!) 160/72

## 2020-06-20 ENCOUNTER — Ambulatory Visit (INDEPENDENT_AMBULATORY_CARE_PROVIDER_SITE_OTHER): Payer: Medicare HMO | Admitting: *Deleted

## 2020-06-20 ENCOUNTER — Other Ambulatory Visit: Payer: Self-pay

## 2020-06-20 ENCOUNTER — Other Ambulatory Visit: Payer: Self-pay | Admitting: Occupational Therapy

## 2020-06-20 DIAGNOSIS — J309 Allergic rhinitis, unspecified: Secondary | ICD-10-CM | POA: Diagnosis not present

## 2020-06-20 NOTE — Patient Instructions (Addendum)
Goals Addressed            This Visit's Progress   . COMPLETED: Patient Stated       Feel more safe and independent with walking longer distances (a rollator would help with this)      Goals Addressed            This Visit's Progress   . COMPLETED: Patient Stated       Feel more safe and independent with walking longer distances (a rollator would help with this)  ACTION PLANNING - FUNCTIONAL MOBILITY Target Problem Area: Difficulty with mobility inside and outside the house   Why Problem May Occur: Decreased endurance for longer distances   Target Goal: Increase safety with mobility inside and outside of the house   STRATEGIES Saving Your Energy: DO: DON'T:  Take breaks--using rollator as needed Try to do too much at one time  Sit on your rollator seat to try and do some of the tasks you would do in standing Stand too long--takes a lot more energy to stand to do something than to sit to do the same task  Take frequent rests. Just taking 15 minutes in a comfortable chair before becoming fatigued may help to restore your energy       Simplifying the way you set up tasks or daily routines: DO: DON'T:  Move slowly [place] during walking   Other   Other   Practice It is important to practice the strategies so we can determine if they will be effective in helping to reach your goal. Follow these specific recommendations: 1. Use  rollator in house for mobility and to move items from point A to B (ie: laundry) 2. Sit on upright rollator seat to do tasks instead of standing to do them 3. Use rollator when out and about when circumstances warrant it  If a strategy does not work the first time, try it again and again (and maybe again). We may make some changes over the next few sessions, based on how they work.   Golden Circle OTR/L     06/20/2020

## 2020-06-20 NOTE — Patient Outreach (Signed)
Aging Gracefully Program  OT Follow-Up Visit  06/20/2020  Anne Barnes 1945-12-06 [id number]  Visit:  3- Third Visit  Start Time:  4562 End Time:  1700 Total Minutes:  30       Readiness to Change Score :  Readiness to Change Score: 10   Durable Medical Equipment: Durable Medical Equipment: Other (rollator) Durable Medical Equipment Distribution Date: 06/20/20   Goals:  Goals Addressed            This Visit's Progress    COMPLETED: Patient Stated       Feel more safe and independent with walking longer distances (a rollator would help with this)  ACTION PLANNING - FUNCTIONAL MOBILITY Target Problem Area: Difficulty with mobility inside and outside the house   Why Problem May Occur: Decreased endurance for longer distances   Target Goal: Increase safety with mobility inside and outside of the house   STRATEGIES Saving Your Energy: DO: DON'T:  Take breaks--using rollator as needed Try to do too much at one time  Sit on your rollator seat to try and do some of the tasks you would do in standing Stand too long--takes a lot more energy to stand to do something than to sit to do the same task  Take frequent rests. Just taking 15 minutes in a comfortable chair before becoming fatigued may help to restore your energy       Simplifying the way you set up tasks or daily routines: DO: DON'T:  Move slowly [place] during walking   Other   Other   Practice It is important to practice the strategies so we can determine if they will be effective in helping to reach your goal. Follow these specific recommendations: 1. Use  rollator in house for mobility and to move items from point A to B (ie: laundry) 2. Sit on upright rollator seat to do tasks instead of standing to do them 3. Use rollator when out and about when circumstances warrant it  If a strategy does not work the first time, try it again and again (and maybe again). We may make some changes over the next few  sessions, based on how they work.   Golden Circle OTR/L     06/20/2020       Post Clinical Reasoning: Client Action (Goal) One Interventions: Client provided with rollator Did Client Try?: Yes Targeted Problem Area Status: A Lot Better Clinician View Of Client Situation:: Anne Barnes is doing pretty good today. She was excited to introduce me to one of her sons (the only one I had not met yet) and just praises how blessed she is to have such a supportive family. AnneBarnes went right to using the rollator outside on her driveway (where she met me), on her sidewalk and in her house. I had her sit down on it to make sure it was a good height for her and she liked the current height. She did say she felt like th handles were a little too low so adjusted them up a notch and she liked this much better. I explained to her how the brakes work and had her lock and unlock them. We talked about safety with not pulling up on it, but rather pushing up from surface she is sitting on or just standing up, then placing her hands on the handles of the rollator.I showed her and her daughter how to fold up the rollator and that there were 2 extra knobs in the bucket under  the seat. We talked about anytime she sits on it and gets up from it to make sure she has it locked. We also talked about how she can sit on it when washing dishes at sink or cooking at stove. Also how she can use it to get her laundry to/from washer/dryer and her room or transport food she has cooked to where she may eat it. Client View Of His/Her Situation:: Anne Barnes is very excited about getting her rollator. She plans on using mostly when she is out and about with her family. Her daughter wants her to go to [place] with the family now. She was also open to using it in the house once I pointed ways she could use it. She is so appreciative also for her step stool that she got last visit and how it has helped her get in and out of her kids SUVs. Next  Visit Plan:: [employer] education

## 2020-06-27 ENCOUNTER — Ambulatory Visit (INDEPENDENT_AMBULATORY_CARE_PROVIDER_SITE_OTHER): Payer: Medicare HMO

## 2020-06-27 DIAGNOSIS — J309 Allergic rhinitis, unspecified: Secondary | ICD-10-CM | POA: Diagnosis not present

## 2020-07-04 ENCOUNTER — Ambulatory Visit (INDEPENDENT_AMBULATORY_CARE_PROVIDER_SITE_OTHER): Payer: Medicare HMO

## 2020-07-04 DIAGNOSIS — J309 Allergic rhinitis, unspecified: Secondary | ICD-10-CM | POA: Diagnosis not present

## 2020-07-11 ENCOUNTER — Ambulatory Visit (INDEPENDENT_AMBULATORY_CARE_PROVIDER_SITE_OTHER): Payer: Medicare HMO

## 2020-07-11 DIAGNOSIS — J309 Allergic rhinitis, unspecified: Secondary | ICD-10-CM

## 2020-07-18 ENCOUNTER — Ambulatory Visit (INDEPENDENT_AMBULATORY_CARE_PROVIDER_SITE_OTHER): Payer: Medicare HMO | Admitting: *Deleted

## 2020-07-18 DIAGNOSIS — J309 Allergic rhinitis, unspecified: Secondary | ICD-10-CM | POA: Diagnosis not present

## 2020-07-21 ENCOUNTER — Other Ambulatory Visit: Payer: Self-pay | Admitting: *Deleted

## 2020-07-21 NOTE — Patient Outreach (Signed)
Aging Psychiatrist Visit # 3- by phone  07/21/2020  Anne Barnes 1946/02/01 [id number]  Visit:   RN Visit # 3  Start Time:   10:20 am End Time:   10:45 am Total Minutes:   25 minutes  Universal RN Interventions: Calendar Distribution: Yes (reviewed with client today- confirmed client using) Exercise Review: Yes (reviewed with client today- confirmed she continues doing exercises routinely) Medications: Yes (reviewed with client today- confirmed no recent medication changes) Medication Changes: No Mood: Yes (reviewed with client today; see narrative) Pain: Yes (reviewed with client today) PCP Advocacy/Support: Yes (reviewed with client today) Fall Prevention: Yes (reviewed with client today- confirmed no new/ recent falls) Incontinence: Yes (reviewed with client today)  Healthcare Provider Communication: Did Higher education careers adviser With [place] Provider?: No  Clinician View of Client Situation: Clinician View Of Client Situation: Anne Barnes continues dealing with grief after the recent passing of her husband and is aware of grief counseling resources available to her, which she is considering.  She remains very motivated to meet her established Aging Gracefully goals and reports good progress thus far.  She confirms that she has had no further medication changes and that she is not experiencing dizziness since her blood pressure medication was adjusted.  Anne Barnes confirms that she has communicated with the [facility] team and expects home modifications to begin soon.   Client's View of His/Her Situation: Client View Of His/Her Situation: "I am not having a great day; I am feeling down because I am remembering my husband and his birthday and our anniversary have passed; I am missing him.... I am trying to stay strong for my children who miss him too; I have good days and bad days, and talking to you about this makes me feel so much better.  I am doing everything I can to stay healthy and  maintain my mobility, and my family is so supportive and hold me accountable and make sure I am doing what I am supposed to.  I am so blessed to have such a supportive family.  I have considered grief counselling and am still considering it, but I think the way I am feeling is just healthy grief.  I am doing the exercises regularly and they have really helped me.  I have not had any falls, and the step stool and the walker have helped me greatly.  I am really looking forward to my home modifications being started."  Medication Assessment: confirmed that previous medication changes continue effective; confirmed no recent changes to medications   OT Update: Will updated Aging Gracefully team on successful completion today of Aging Gracefully RN Visit # 3 by phone  Session Summary: Anne Barnes is making great progress in meeting her previously established Aging Gracefully goals around fall prevention and remains very engaged in taking responsibility for her ongoing safety at home.  Anne Barnes opted to complete today's RN Visit by phone, as she reports home modifications have not yet been initiated at her home.  Brainstorming was completed and client reports she has continued taking action as previously discussed to prevent falls; no new interventions were indicated today.  Client was provided with counseling resource options and emotional support/ encouragement around ongoing grief she is experiencing around the recent passing of her husband, which she agrees to consider.  She continues to find support from her very supportive and involved family.  We agreed that I would contact her again in approximately one month to schedule final Aging Gracefully visit  at patient's home, once home modiifications have been initiated.  Anne Rack, RN, BSN, [employer] [facility] Care Management  [phone]

## 2020-07-21 NOTE — Patient Instructions (Signed)
Anne Barnes, it was so nice to talk to you today; you are right on track in meeting your established Aging Gracefully goals-- keep up the great work!  As we discussed, I will contact you by phone after Thanksgiving to schedule our final visit together at your home.  Have a very Happy and blessed Thanksgiving!  CLIENT/RN ACTION PLAN - FALL PREVENTION  Registered Nurse:  [name] RN Date: July 21, 2020  Client Name:Anne Barnes Client ID:  [id number]   Target Area:  FALL PREVENTION    Why Problem May Occur: -- occasional intermittent dizziness/ light-headedness: so glad to hear this is better with changes made to your blood pressure medications -- occasional urinary incontinence -- occasional limited ability to perform exercises-- good job in regularly completing the Aging Gracefully exercises!  I am glad you are finding them helpful! -- blood pressure medications can cause light-headedness-- I am glad you have talked to your doctor about your medicines and are monitoring/ recording your blood pressures at home-- keep up the great work!  It is great that you are following a heart healthy diet to control your blood pressure   Target Goal: "I want to stay self-sufficient and not have any falls as I get older; I want to stay mobile."   STRATEGIES Coping Strategies Ideas  Change Positions Slowly: lying to sitting, sitting to standing -- keep up the great work using your new walker and the step stool provided to you by the Occupational Therapist . Changing positions can make people lightheaded. . When getting up in the morning sit for a few minutes, before standing.  . Stand for a few minutes before walking and hold on to sturdy furniture or countertop.  . Don't rush, for anything-- take your time with tasks, walking, answering door/ phone . If you find yourself feeling light-headed, sit down immediately  Drink water-- keep up the great work at staying hydrated and following your doctor's  instructions on the amount you drink each day . Dehydration can make people dizzy and can lead to falls. . Ask your healthcare provider how much water you can drink. . Decrease caffeine, caffeine makes you urinate a lot, which can make you dehydrated. .   If you fall, tell someone . Tell a friend or family member even if you didn't get hurt. . Tell your healthcare provider if you fall.  They can help you figure out why. . Write any falls on your calendar along with the circumstances that made you fall . Wear your life-alert system or keep your cell phone on you at all times, in case you fall and need help getting up, you will be able to call for help  Get your vision and hearing checked- good job in maintaining these important annual appointments . Poor vision and hearing loss can make people fall. . Glaucoma and diabetes can cause poor vision. .   Other .    Prevention Ideas  Review your Medicines . Many medicines can make you dizzy or sleepy and increase your risk of falling. . Your Aging Gracefully Nurse will look at your medications and see if you are taking any that might cause falling. . Always talk to your doctor about your medicines with each visit-- call your doctor if you think you are having side effects from your medicines that might cause you to fall  Activity and Exercise-- keep up the great work in doing your own exercises and walking routines-- gradually incorporate the Aging Gracefully exercises  into your routine . Aging Gracefully exercises . Walking (inside or outside) . Dancing . Gardening . Housework:  cooking, Education administrator, Medical sales representative . Exercise while watching TV . Swimming or water aerobics. .   Strengthen Bones- consider asking your doctor if you should take calcium (TUMS) in addition to the vitamin D you already take . Exercise makes your bones stronger. . Vitamin D and Calcium make your bones stronger.  Ask your Healthcare provider if you should be taking them. . Your  body makes vitamin D from the sun.  Sit in the sun for 10 minutes every day (without sunscreen). .   Control your urine  . Prevent constipation . Cut back on caffeinated drinks. . Don't wait to urinate. . If diabetic, control your blood sugar. . Ask your Aging Gracefully Nurse and Healthcare Provider  about urine control. . Try the Kegel exercises we discussed and see if you think it helps you control your urges to urinate  Control your blood sugar (if you are diabetic) . High blood sugar can cause frequent urination, poor vision, and numbness in your feet, which can make you fall.   . Low blood sugar can cause confusion and dizziness. .   Other coping strategies 1. Always wear shoes with good soles 2.  Pay close attention to weather conditions, especially when it is icy or wet outside 3. Maintain good posture when standing, sitting, and walking 4.  Practice using the walker when you walk long distances, once the Occupational therapist provides it to you    PRACTICE It is important to practice the strategies so we can determine if they will be effective in helping to reach the goal.    Follow these specific recommendations:   07/21/20: -- You have done a wonderful job making the strategies we have discussed a part of your regular routine-- keep up the excellent work -- I am so glad that your medications have been adjusted and you are no longer having dizzy spells -- please consider the resources we talked about if your grief becomes unmanageable or you feel you need these resources to heal from the loss of your husband.  06/13/20: -- talk to your doctor as planned this week about blood pressure control/ need for medications -- continue regularly doing the Aging Gracefully exercises and using the assistive equipment supplied by the OT -- write down your blood pressures at home in your calendar book to share with your doctor -- keep up the great work eating right and keeping your home free  from clutter that might cause a fall     If strategy does not work the first time, try it again.     We may make some changes over the next few sessions.      [name], RN, BSN, [employer] [facility] Care Management  [phone]

## 2020-07-25 ENCOUNTER — Ambulatory Visit (INDEPENDENT_AMBULATORY_CARE_PROVIDER_SITE_OTHER): Payer: Medicare HMO

## 2020-07-25 DIAGNOSIS — J309 Allergic rhinitis, unspecified: Secondary | ICD-10-CM | POA: Diagnosis not present

## 2020-07-29 ENCOUNTER — Other Ambulatory Visit: Payer: Self-pay | Admitting: Family Medicine

## 2020-07-29 ENCOUNTER — Telehealth: Payer: Self-pay

## 2020-07-29 MED ORDER — ALPRAZOLAM 0.5 MG PO TABS
ORAL_TABLET | ORAL | 1 refills | Status: DC
Start: 2020-07-29 — End: 2022-02-16

## 2020-07-29 NOTE — Telephone Encounter (Signed)
I sent in the Alprazolam  

## 2020-07-29 NOTE — Telephone Encounter (Signed)
Thank you :)

## 2020-07-29 NOTE — Telephone Encounter (Signed)
Pt called stated she is experiencing increased anxiety. Pt is shaky and coming up on husbands death anniversary.  His birthday was Monday. Pt is requesting refill of anxiety med. PCP contacted and will send in refill request.   Requesting: Contract:05/24/20 UDS:05/24/20 Last Visit:06/15/20 Next Visit:10/20/20 Last Refill:06/15/20  Please Advise

## 2020-08-01 ENCOUNTER — Ambulatory Visit (INDEPENDENT_AMBULATORY_CARE_PROVIDER_SITE_OTHER): Payer: Medicare HMO | Admitting: *Deleted

## 2020-08-01 DIAGNOSIS — J309 Allergic rhinitis, unspecified: Secondary | ICD-10-CM

## 2020-08-04 ENCOUNTER — Encounter: Payer: Self-pay | Admitting: Cardiology

## 2020-08-04 ENCOUNTER — Ambulatory Visit: Payer: Medicare HMO | Admitting: Cardiology

## 2020-08-04 ENCOUNTER — Other Ambulatory Visit: Payer: Self-pay

## 2020-08-04 VITALS — BP 138/62 | HR 81 | Ht 59.0 in | Wt 230.4 lb

## 2020-08-04 DIAGNOSIS — I5033 Acute on chronic diastolic (congestive) heart failure: Secondary | ICD-10-CM | POA: Diagnosis not present

## 2020-08-04 DIAGNOSIS — I10 Essential (primary) hypertension: Secondary | ICD-10-CM | POA: Diagnosis not present

## 2020-08-04 DIAGNOSIS — I2510 Atherosclerotic heart disease of native coronary artery without angina pectoris: Secondary | ICD-10-CM

## 2020-08-04 DIAGNOSIS — E785 Hyperlipidemia, unspecified: Secondary | ICD-10-CM | POA: Diagnosis not present

## 2020-08-04 DIAGNOSIS — Z6841 Body Mass Index (BMI) 40.0 and over, adult: Secondary | ICD-10-CM | POA: Diagnosis not present

## 2020-08-04 MED ORDER — SPIRONOLACTONE 25 MG PO TABS: 25.0000 mg | ORAL_TABLET | Freq: Every day | ORAL | 2 refills | Status: DC

## 2020-08-04 NOTE — Progress Notes (Signed)
Cardiology Office Note:    Date:  08/04/2020   ID:  Anne Barnes, DOB Sep 12, 1946, MRN [id number]  PCP:  Mosie Lukes, MD  Cardiologist:  Ena Dawley, MD  Electrophysiologist:  None   Referring MD: Mosie Lukes, MD   Reason for visit: 6 months follow-up  History of Present Illness:    Anne Barnes is a 74 y.o. female with a hx of bradycardia down to low 50s while on beta blocker. The patient was asymptomatic at the time and states that she has always had slow heart rate. Pt with hx of HTN, hyperglycemia, mild HLD, osteoarthritis, tinnitus, IBS, OSA intolerant to CPAP and headaches thought to be allergies.  Dr. Annamaria Boots follows her sleep apnea. Echo done 02/2017 EF was 60-65% no RWMA,  No significant valvular abnormalities. She has been using low-dose of spironolactone for lower extremity edema that has been stable over last couple years. She has been very careful with her diet and walks with her daughter in a park several times a week.  She was seen in January 2021 for concern of chest pains, a coronary CTA was performed that showed mild nonobstructive CAD and also findings of peribronchovascular nodules in the medial aspect of the right lower lobe with ill-defined margins, likely infectious or inflammatory in etiology.  Repeat CT scan on Jan 26, 2020 showed that those resolved and it confirmed inflammatory etiology.  She was started on Crestor 10 mg daily that she tolerates well.  The patient is coming after 6 months, she states that she is mourning the loss of her husband of 57 years.  She has extensive family that she is surrounded by.  She has occasional chest pain that are nonexertional, only lasting few minutes, no significant shortness of breath, in the last couple weeks she was not able to exercise or eat healthy, but she is determined to start doing that again.  She has mild lower extremity edema, but no orthopnea proximal nocturnal dyspnea, no dizziness or falls.  Past  Medical History:  Diagnosis Date  . Acute combined systolic and diastolic heart failure ([place]) 05/26/2015  . ALLERGIC RHINITIS   . Allergic urticaria 08/01/2014   Patient reports allergies to shrimp, egg whites, tomatoes, dairy   . 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   Office Spirometry 11/07/17-WNL-FVC 1.58/82%, FEV1 1.27/85%, ratio 0.80, FEF 25-75% 0.26/89%  . Bradycardia 02/25/2017  . Chiari malformation    Noted MRI brain 09/2013 - s/p neuro eval for same  . DIVERTICULOSIS, COLON   . Essential hypertension 08/28/2010   Qualifier: Diagnosis of  By: Marca Ancona RMA, Lucy     . Frequent headaches   . GERD   . H/O measles   . H/O mumps   . Hearing loss 01/18/2017  . History of chicken pox   . Hyperglycemia 03/05/2016  . HYPERTENSION   . Hypocalcemia 02/25/2017  . IBS (irritable bowel syndrome) 05/26/2015  . Nonspecific abnormal electrocardiogram (ECG) (EKG)   . OBSTRUCTIVE SLEEP APNEA 12/2008 dx   noncompliant with CPAP qhs  . Obstructive sleep apnea 08/28/2010   NPSG 12/2008:  AHI 13/hr with desats to 78%   . Preventative health care 06/11/2017  . Sciatica    right side  . Seasonal and perennial allergic rhinitis 08/28/2010   Allergy profile 03/11/14- positive especially for dust, cat and dog Food allergy profile- total IgE 86 with several food group elevations Sed rate-WNL     . Sinusitis 06/06/2017  . 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  Vaginitis 01/18/2017  . Vitamin D deficiency 06/11/2017    Past Surgical History:  Procedure Laterality Date  . ABDOMINAL HYSTERECTOMY  1986  . BREAST SURGERY  1973   Breast biopsy  . UMBILICAL HERNIA REPAIR      Current Medications: Current Meds  Medication Sig  . albuterol (VENTOLIN HFA) 108 (90 Base) MCG/ACT inhaler INHALE 2 PUFFS INTO THE LUNGS EVERY 6 HOURS AS NEEDED FOR WHEEZING FOR SHORTNESS OF BREATH  . ALPRAZolam (XANAX) 0.5 MG tablet TAKE 1 TABLET BY MOUTH TWICE DAILY AS NEEDED FOR ANXIETY AND FOR SLEEP  . amLODipine (NORVASC) 2.5 MG tablet Take 2.5 mg  by mouth 2 (two) times daily.  Marland Kitchen azelastine (ASTELIN) 0.1 % nasal spray Place 2 sprays into both nostrils 2 (two) times daily.  Marland Kitchen b complex vitamins tablet Take 1 tablet by mouth daily.  . Cholecalciferol (VITAMIN D) 2000 UNITS CAPS Take by mouth daily.   Marland Kitchen EPINEPHrine 0.3 mg/0.3 mL IJ SOAJ injection Inject 0.3 mg into the muscle as needed for anaphylaxis.  . fluticasone (FLONASE) 50 MCG/ACT nasal spray Place 2 sprays into both nostrils daily.  Marland Kitchen levocetirizine (XYZAL) 5 MG tablet TAKE 1 TABLET BY MOUTH ONCE DAILY IN THE EVENING  . losartan (COZAAR) 100 MG tablet Take 100 mg by mouth daily.  . montelukast (SINGULAIR) 10 MG tablet TAKE 1 TABLET BY MOUTH AT BEDTIME  . Multiple Vitamin (MULTIVITAMIN) tablet Take 1 tablet by mouth daily.    Marland Kitchen olopatadine (PATANOL) 0.1 % ophthalmic solution Place 1 drop into both eyes 2 (two) times daily.  Marland Kitchen omeprazole (PRILOSEC) 20 MG capsule Take 1 capsule by mouth once daily  . rosuvastatin (CRESTOR) 10 MG tablet Take 1 tablet (10 mg total) by mouth daily.  Marland Kitchen topiramate (TOPAMAX) 25 MG tablet Take 1 tablet (25 mg total) by mouth at bedtime.  . [DISCONTINUED] spironolactone (ALDACTONE) 25 MG tablet Take 1/2 (one-half) tablet by mouth once daily     Allergies:   Codeine, Hydralazine hcl, Lisinopril-hydrochlorothiazide, Metoprolol, Other, Shrimp [shellfish allergy], and Tomato   Social History   Socioeconomic History  . Marital status: Widowed    Spouse name: Not on file  . Number of children: Not on file  . Years of education: Not on file  . Highest education level: Not on file  Occupational History  . Occupation: retired  Tobacco Use  . Smoking status: Never Smoker  . Smokeless tobacco: Never Used  . Tobacco comment: Married, lives with spouse, dtr 2 g-kids.  Vaping Use  . Vaping Use: Never used  Substance and Sexual Activity  . Alcohol use: No    Comment: rare  . Drug use: No  . Sexual activity: Not Currently    Comment: lives with husband, no  dietary restrictions avoid foods allergic to, retired  Other Topics Concern  . Not on file  Social History Narrative   Right handed   One story with daughter and two grandkids   Social Determinants of Health   Financial Resource Strain: Low Risk   . Difficulty of Paying Living Expenses: Not hard at all  Food Insecurity: No Food Insecurity  . Worried About Charity fundraiser in the Last Year: Never true  . Ran Out of Food in the Last Year: Never true  Transportation Needs: No Transportation Needs  . Lack of Transportation (Medical): No  . Lack of Transportation (Non-Medical): No  Physical Activity:   . Days of Exercise per Week: Not on file  . Minutes of  Exercise per Session: Not on file  Stress:   . Feeling of Stress : Not on file  Social Connections:   . Frequency of Communication with Friends and Family: Not on file  . Frequency of Social Gatherings with Friends and Family: Not on file  . Attends Religious Services: Not on file  . Active Member of Clubs or Organizations: Not on file  . Attends Archivist Meetings: Not on file  . Marital Status: Not on file     Family History: The patient's family history includes Alcohol abuse in her brother and another family member; Arthritis in her father and mother; Diabetes in her brother; Fibromyalgia in her daughter; Berenice Primas' disease in her daughter; Heart attack in her brother; Heart disease in her brother and father; Hypertension in her daughter, daughter, maternal grandfather, and sister; Kidney disease in her father, sister, and sister; Lung cancer in her brother; Lupus in her daughter; Obesity in her sister; Pneumonia in her brother and father; Rheum arthritis in her daughter; Sjogren's syndrome in her daughter; Throat cancer in her brother. There is no history of Ataxia, Chorea, Dementia, Mental retardation, Migraines, Multiple sclerosis, Neurofibromatosis, Neuropathy, Parkinsonism, Seizures, or Stroke.  ROS:   Please see  the history of present illness.    All other systems reviewed and are negative.  EKGs/Labs/Other Studies Reviewed:    The following studies were reviewed today:  EKG:  EKG is not ordered today.    Recent Labs: 09/03/2019: TSH 1.48 02/17/2020: ALT 32; BUN 17; Creatinine, Ser 1.00; Hemoglobin 13.3; NT-Pro BNP 31; Platelets 253; Potassium 4.1; Sodium 142  Recent Lipid Panel    Component Value Date/Time   CHOL 116 02/17/2020 0904   TRIG 39 02/17/2020 0904   TRIG 55 12/30/2009 0000   HDL 42 02/17/2020 0904   CHOLHDL 2.8 02/17/2020 0904   CHOLHDL 4 09/03/2019 0952   VLDL 7.4 09/03/2019 0952   LDLCALC 64 02/17/2020 0904    Physical Exam:    VS:  BP 138/62   Pulse 81   Ht 4\' 11"  (1.499 m)   Wt 230 lb 6.4 oz (104.5 kg)   SpO2 97%   BMI 46.54 kg/m     Wt Readings from Last 3 Encounters:  08/04/20 230 lb 6.4 oz (104.5 kg)  06/08/20 222 lb 12.8 oz (101.1 kg)  06/08/20 223 lb 6.4 oz (101.3 kg)     GEN: Well nourished, well developed in no acute distress HEENT: Normal NECK: No JVD; No carotid bruits LYMPHATICS: No lymphadenopathy CARDIAC: RRR, no murmurs, rubs, gallops RESPIRATORY:  Clear to auscultation without rales, wheezing or rhonchi  ABDOMEN: Soft, non-tender, non-distended MUSCULOSKELETAL:  No edema; No deformity  SKIN: Warm and dry NEUROLOGIC:  Alert and oriented x 3 PSYCHIATRIC:  Normal affect   ASSESSMENT:    1. Coronary artery disease involving native coronary artery of native heart without angina pectoris   2. Acute on chronic diastolic heart failure ([place])   3. Hyperlipidemia, unspecified hyperlipidemia type   4. Essential hypertension    PLAN:    In order of problems listed above:  1. Mild nonobstructive CAD, coronary CTA showed coronary calcium of 127 that was 71 percentile February 2021.  There was mild nonobstructive CAD and she was started on Crestor 10 mg daily that she tolerates well.  She is currently asymptomatic, she is encouraged to start  exercising again. 2. Acute on chronic diastolic CHF with minimal lower extremity edema, some improvement with spironolactone, I will increase to 25 mg  p.o. daily.   3. Hypertension -controlled at home. 4. Hyperlipidemia -tolerating Crestor, repeat lipids all at goal with normal LFTs.   Medication Adjustments/Labs and Tests Ordered: Current medicines are reviewed at length with the patient today.  Concerns regarding medicines are outlined above.  No orders of the defined types were placed in this encounter.  Meds ordered this encounter  Medications  . spironolactone (ALDACTONE) 25 MG tablet    Sig: Take 1 tablet (25 mg total) by mouth daily.    Dispense:  90 tablet    Refill:  2    DOSE INCREASE    Patient Instructions  Medication Instructions:   INCREASE YOUR SPIRONOLACTONE TO 25 MG BY MOUTH DAILY  *If you need a refill on your cardiac medications before your next appointment, please call your pharmacy*    Follow-Up:  ON November 28, 2020 AT 10 AM--SCHEDULING PLEASE ADD HER TO 11/28/20 AT DR. Francesca Oman 10:00 AM SLOT-OK PER DR. Meda Coffee AND IVY.     Signed, Ena Dawley, MD  08/04/2020 10:00 AM    [place]

## 2020-08-04 NOTE — Patient Instructions (Signed)
Medication Instructions:   INCREASE YOUR SPIRONOLACTONE TO 25 MG BY MOUTH DAILY  *If you need a refill on your cardiac medications before your next appointment, please call your pharmacy*    Follow-Up:  ON November 28, 2020 AT 10 AM--SCHEDULING PLEASE ADD HER TO 11/28/20 AT DR. Francesca Oman 10:00 AM SLOT-OK PER DR. Meda Coffee AND Narada Uzzle.

## 2020-08-08 ENCOUNTER — Ambulatory Visit (INDEPENDENT_AMBULATORY_CARE_PROVIDER_SITE_OTHER): Payer: Medicare HMO | Admitting: *Deleted

## 2020-08-08 DIAGNOSIS — J309 Allergic rhinitis, unspecified: Secondary | ICD-10-CM | POA: Diagnosis not present

## 2020-08-15 ENCOUNTER — Ambulatory Visit (INDEPENDENT_AMBULATORY_CARE_PROVIDER_SITE_OTHER): Payer: Medicare HMO

## 2020-08-15 DIAGNOSIS — J309 Allergic rhinitis, unspecified: Secondary | ICD-10-CM

## 2020-08-22 ENCOUNTER — Ambulatory Visit (INDEPENDENT_AMBULATORY_CARE_PROVIDER_SITE_OTHER): Payer: Medicare HMO

## 2020-08-22 DIAGNOSIS — J309 Allergic rhinitis, unspecified: Secondary | ICD-10-CM

## 2020-08-25 ENCOUNTER — Other Ambulatory Visit: Payer: Self-pay | Admitting: Family Medicine

## 2020-08-25 DIAGNOSIS — J3089 Other allergic rhinitis: Secondary | ICD-10-CM

## 2020-08-29 ENCOUNTER — Ambulatory Visit (INDEPENDENT_AMBULATORY_CARE_PROVIDER_SITE_OTHER): Payer: Medicare HMO

## 2020-08-29 DIAGNOSIS — J309 Allergic rhinitis, unspecified: Secondary | ICD-10-CM | POA: Diagnosis not present

## 2020-08-31 ENCOUNTER — Encounter: Payer: Self-pay | Admitting: *Deleted

## 2020-08-31 ENCOUNTER — Other Ambulatory Visit: Payer: Self-pay | Admitting: *Deleted

## 2020-08-31 NOTE — Patient Outreach (Signed)
Aging Gracefully Program  08/31/2020  Anne Barnes 09/07/1946 [id number]  Placed call to [name] to complete Aging Gracefully RN # 4 (final) client visit; received automated outgoing voice message stating that voice mail box has not been set up- unable to leave client voice message requesting call back.  Plan:  Will re-attempt outreach to patient again next week if I do not hear back from her prior to then  [name], RN, BSN, [employer] Coordinator [facility] Care Management  [phone]

## 2020-09-05 ENCOUNTER — Ambulatory Visit (INDEPENDENT_AMBULATORY_CARE_PROVIDER_SITE_OTHER): Payer: Medicare HMO | Admitting: *Deleted

## 2020-09-05 DIAGNOSIS — J309 Allergic rhinitis, unspecified: Secondary | ICD-10-CM | POA: Diagnosis not present

## 2020-09-06 ENCOUNTER — Encounter: Payer: Self-pay | Admitting: *Deleted

## 2020-09-06 ENCOUNTER — Other Ambulatory Visit: Payer: Self-pay | Admitting: *Deleted

## 2020-09-06 NOTE — Patient Instructions (Signed)
Goals Addressed              This Visit's Progress   .  COMPLETED: "I want to stay as mobile as I can and keep exercising so I don't have any falls or have to rely on others; I want to stay self-sufficient as I get older" (pt-stated)   On track     CLIENT/RN [place]  Registered Nurse:  [name] RN Date: September 06, 2020  Client Name:Anne Barnes Client ID:  [id number]   Target Area:  FALL PREVENTION    Why Problem May Occur: -- occasional intermittent dizziness/ light-headedness: so glad to hear this is better with changes made to your blood pressure medications -- occasional urinary incontinence -- occasional limited ability to perform exercises-- good job in regularly completing the Aging Gracefully exercises!  I am glad you are finding them helpful! -- blood pressure medications can cause light-headedness-- I am glad you have talked to your doctor about your medicines and are monitoring/ recording your blood pressures at home-- keep up the great work!  It is great that you are following a heart healthy diet to control your blood pressure   Target Goal: "I want to stay self-sufficient and not have any falls as I get older; I want to stay mobile."   STRATEGIES Coping Strategies Ideas  Change Positions Slowly: lying to sitting, sitting to standing -- keep up the great work using your new walker and the step stool provided to you by the Occupational Therapist . Changing positions can make people lightheaded. . When getting up in the morning sit for a few minutes, before standing.  . Stand for a few minutes before walking and hold on to sturdy furniture or countertop.  . Don't rush, for anything-- take your time with tasks, walking, answering door/ phone . If you find yourself feeling light-headed, sit down immediately  Drink water-- keep up the great work at staying hydrated and following your doctor's instructions on the amount you drink each day . Dehydration  can make people dizzy and can lead to falls. . Ask your healthcare provider how much water you can drink. . Decrease caffeine, caffeine makes you urinate a lot, which can make you dehydrated. .   If you fall, tell someone . Tell a friend or family member even if you didn't get hurt. . Tell your healthcare provider if you fall.  They can help you figure out why. . Write any falls on your calendar along with the circumstances that made you fall . Wear your life-alert system or keep your cell phone on you at all times, in case you fall and need help getting up, you will be able to call for help  Get your vision and hearing checked- good job in maintaining these important annual appointments . Poor vision and hearing loss can make people fall. . Glaucoma and diabetes can cause poor vision. .   Other .    Prevention Ideas  Review your Medicines . Many medicines can make you dizzy or sleepy and increase your risk of falling. . Your Aging Gracefully Nurse will look at your medications and see if you are taking any that might cause falling. . Always talk to your doctor about your medicines with each visit-- call your doctor if you think you are having side effects from your medicines that might cause you to fall  Activity and Exercise-- keep up the great work in doing your own exercises and  walking routines-- gradually incorporate the Aging Gracefully exercises into your routine . Aging Gracefully exercises . Walking (inside or outside) . Dancing . Gardening . Housework:  cooking, Education administrator, Medical sales representative . Exercise while watching TV . Swimming or water aerobics. .   Strengthen Bones- consider asking your doctor if you should take calcium (TUMS) in addition to the vitamin D you already take . Exercise makes your bones stronger. . Vitamin D and Calcium make your bones stronger.  Ask your Healthcare provider if you should be taking them. . Your body makes vitamin D from the sun.  Sit in the sun for 10  minutes every day (without sunscreen). .   Control your urine  . Prevent constipation . Cut back on caffeinated drinks. . Don't wait to urinate. . If diabetic, control your blood sugar. . Ask your Aging Gracefully Nurse and Healthcare Provider  about urine control. . Try the Kegel exercises we discussed and see if you think it helps you control your urges to urinate  Control your blood sugar (if you are diabetic) . High blood sugar can cause frequent urination, poor vision, and numbness in your feet, which can make you fall.   . Low blood sugar can cause confusion and dizziness. .   Other coping strategies 1. Always wear shoes with good soles 2.  Pay close attention to weather conditions, especially when it is icy or wet outside 3. Maintain good posture when standing, sitting, and walking 4.  Practice using the walker when you walk long distances, once the Occupational therapist provides it to you    PRACTICE It is important to practice the strategies so we can determine if they will be effective in helping to reach the goal.    Follow these specific recommendations:   09/06/20: Anne Barnes it has been a pleasure working with you in the Aging Gracefully program- you have made great progress in successfully achieving your established goals around fall prevention.  Keep up the great work staying active and using the strategies to stay safe that we have discussed over the last few months.  I wish you all the best and very Happy Christmas and New year   07/21/20: -- You have done a wonderful job making the strategies we have discussed a part of your regular routine-- keep up the excellent work -- I am so glad that your medications have been adjusted and you are no longer having dizzy spells -- please consider the resources we talked about if your grief becomes unmanageable or you feel you need these resources to heal from the loss of your husband.  06/13/20: -- talk to your doctor as planned  this week about blood pressure control/ need for medications -- continue regularly doing the Aging Gracefully exercises and using the assistive equipment supplied by the OT -- write down your blood pressures at home in your calendar book to share with your doctor -- keep up the great work eating right and keeping your home free from clutter that might cause a fall     If strategy does not work the first time, try it again.     We may make some changes over the next few sessions.      [name], RN, BSN, [employer] [facility] Care Management  [phone]

## 2020-09-06 NOTE — Patient Outreach (Signed)
Aging Psychiatrist Visit # 4- by phone 09/06/2020  Anne Barnes [birth year] [id number]  Visit:   Aging Gracefully RN Visit # 4- completed by phone; HIPAA/ identity verified  Start Time:   11:20 am End Time:   11:40 am Total Minutes:   20 minutes  Readiness To Change Score:  Readiness to Change Score: 6.67  Universal RN Interventions: Calendar Distribution: Yes (reviewed with client today) Exercise Review: Yes (reviewed with client today) Medications: Yes (reviewed with client today) Medication Changes: No Mood: Yes (reviewed with client today) Pain: Yes (reviewed with client today) PCP Advocacy/Support: Yes (reviewed with client today) Fall Prevention: Yes (reviewed with client today) Incontinence: Yes (reviewed with client today)  Healthcare Provider Communication: Did Higher education careers adviser With [place] Provider?: No  Clinician View of Client Situation: Clinician View Of Client Situation: [name] has made remarkable progress in the Aging Gracefully program and verbalizes a great deal of confidence as a result of the OT and RN interventions provided.  She has met her previously established Aging Designer, fashion/clothing goals around fall prevention.  She is looking forward to the upcoming home modifications which will be starting soon.   Client's View of His/Her Situation: Client View Of His/Her Situation: "I feel like I have learned so much from being in this program; at first, I thought I didn't need the OT and RN coming out to see me- but I was wrong- it has been very beneficial to me, the entire program.  It has improved my attitude-- it is much more positive; it helps me to know that I am being held acountable, and that the people in the program actually care about me.  I feel like I have made great progress and am much more confident overall, with everything.  I have not had any more falls, am using the walker which is helping greatly, and doing the exercises- my balance is  much better, and now that my blood pressure medications have been changed I am no longer dizzy.  They are going to start the home modifications soon, and I am excited about that."  OT Update: Will update Aging Gracefully team on successful completion of (final) Aging Gracefully RN Visit # 4 by phone today  Session Summary: Pleasant final Aging Gracefully RN visit by phone today; client has successfully achieved er previously established goals around fall prevention and is looking forward to her home modifications, to begin soon.  It has been a pleasure working with [employer] in the [place], [place], BSN, [place] [facility] Management  [phone]

## 2020-09-12 ENCOUNTER — Ambulatory Visit (INDEPENDENT_AMBULATORY_CARE_PROVIDER_SITE_OTHER): Payer: Medicare HMO

## 2020-09-12 DIAGNOSIS — J309 Allergic rhinitis, unspecified: Secondary | ICD-10-CM

## 2020-09-27 ENCOUNTER — Telehealth: Payer: Self-pay | Admitting: Allergy

## 2020-09-27 NOTE — Telephone Encounter (Signed)
Spoke to patient, she stated that she is with her daughter everyday due to doctors not wanting patient to be left alone. Patient was informed to quarantine for 14 days. She may get tested again at a later date. She asked about her injections, I informed her that due to the time we would have to back her down a few doses. Patient verbalized understanding. Patient was informed if she has any questions or concerns to please reach out to the office.

## 2020-09-27 NOTE — Telephone Encounter (Signed)
Shot pt calling to let us know her daughter was dx with covid on thursday 12/30. PT last shot was 12/20, she tested negative on saturday 1/1 and has no symptoms. Daughter was retested yesterday 1/3 and still covid positive. Please advise how long until she has to quarantien for & when can she come in to get her shot?  [facility] cell [phone] or home [phone]

## 2020-09-28 ENCOUNTER — Other Ambulatory Visit: Payer: Self-pay | Admitting: Family Medicine

## 2020-09-28 ENCOUNTER — Other Ambulatory Visit: Payer: Self-pay | Admitting: Cardiovascular Disease

## 2020-10-04 DIAGNOSIS — G4733 Obstructive sleep apnea (adult) (pediatric): Secondary | ICD-10-CM | POA: Diagnosis not present

## 2020-10-17 ENCOUNTER — Ambulatory Visit (INDEPENDENT_AMBULATORY_CARE_PROVIDER_SITE_OTHER): Payer: Medicare HMO

## 2020-10-17 ENCOUNTER — Ambulatory Visit: Payer: Medicare HMO | Admitting: Cardiology

## 2020-10-17 DIAGNOSIS — J309 Allergic rhinitis, unspecified: Secondary | ICD-10-CM | POA: Diagnosis not present

## 2020-10-19 ENCOUNTER — Telehealth: Payer: Self-pay | Admitting: Internal Medicine

## 2020-10-19 NOTE — Telephone Encounter (Signed)
This should go to the DME which is [employer]

## 2020-10-19 NOTE — Telephone Encounter (Signed)
Spoke with Mann[place] Choice from [place]. She stated that the case has been voided out and nothing further is needed.   Will close this encounter.

## 2020-10-20 ENCOUNTER — Encounter: Payer: Self-pay | Admitting: Family Medicine

## 2020-10-20 ENCOUNTER — Other Ambulatory Visit: Payer: Self-pay

## 2020-10-20 ENCOUNTER — Ambulatory Visit (INDEPENDENT_AMBULATORY_CARE_PROVIDER_SITE_OTHER): Payer: Medicare HMO | Admitting: Family Medicine

## 2020-10-20 VITALS — BP 140/47 | HR 82 | Temp 98.2°F | Resp 12 | Ht 59.0 in | Wt 234.0 lb

## 2020-10-20 DIAGNOSIS — Z23 Encounter for immunization: Secondary | ICD-10-CM

## 2020-10-20 DIAGNOSIS — R739 Hyperglycemia, unspecified: Secondary | ICD-10-CM | POA: Diagnosis not present

## 2020-10-20 DIAGNOSIS — E669 Obesity, unspecified: Secondary | ICD-10-CM | POA: Diagnosis not present

## 2020-10-20 DIAGNOSIS — I10 Essential (primary) hypertension: Secondary | ICD-10-CM

## 2020-10-20 DIAGNOSIS — Z1211 Encounter for screening for malignant neoplasm of colon: Secondary | ICD-10-CM

## 2020-10-20 DIAGNOSIS — E785 Hyperlipidemia, unspecified: Secondary | ICD-10-CM

## 2020-10-20 DIAGNOSIS — E559 Vitamin D deficiency, unspecified: Secondary | ICD-10-CM

## 2020-10-20 LAB — COMPREHENSIVE METABOLIC PANEL
ALT: 27 U/L (ref 0–35)
AST: 16 U/L (ref 0–37)
Albumin: 4 g/dL (ref 3.5–5.2)
Alkaline Phosphatase: 87 U/L (ref 39–117)
BUN: 20 mg/dL (ref 6–23)
CO2: 31 mEq/L (ref 19–32)
Calcium: 9.3 mg/dL (ref 8.4–10.5)
Chloride: 105 mEq/L (ref 96–112)
Creatinine, Ser: 0.89 mg/dL (ref 0.40–1.20)
GFR: 64.46 mL/min (ref >60.00)
Glucose, Bld: 89 mg/dL (ref 70–99)
Potassium: 4.1 mEq/L (ref 3.5–5.1)
Sodium: 141 mEq/L (ref 135–145)
Total Bilirubin: 0.4 mg/dL (ref 0.2–1.2)
Total Protein: 7.2 g/dL (ref 6.0–8.3)

## 2020-10-20 LAB — LIPID PANEL
Cholesterol: 128 mg/dL (ref 0–200)
HDL: 48.4 mg/dL (ref >39.00)
LDL Cholesterol: 71 mg/dL (ref 0–99)
NonHDL: 79.78
Total CHOL/HDL Ratio: 3
Triglycerides: 45 mg/dL (ref 0.0–149.0)
VLDL: 9 mg/dL (ref 0.0–40.0)

## 2020-10-20 LAB — CBC
HCT: 39.2 % (ref 36.0–46.0)
Hemoglobin: 12.9 g/dL (ref 12.0–15.0)
MCHC: 32.9 g/dL (ref 30.0–36.0)
MCV: 92 fl (ref 78.0–100.0)
Platelets: 245 10*3/uL (ref 150.0–400.0)
RBC: 4.26 Mil/uL (ref 3.87–5.11)
RDW: 13.8 % (ref 11.5–15.5)
WBC: 8.8 10*3/uL (ref 4.0–10.5)

## 2020-10-20 LAB — VITAMIN D 25 HYDROXY (VIT D DEFICIENCY, FRACTURES): VITD: 34.17 ng/mL (ref 30.00–100.00)

## 2020-10-20 LAB — HEMOGLOBIN A1C: Hgb A1c MFr Bld: 5.5 % (ref 4.6–6.5)

## 2020-10-20 LAB — TSH: TSH: 1.11 u[IU]/mL (ref 0.35–4.50)

## 2020-10-20 NOTE — Progress Notes (Incomplete)
Subjective:    Patient ID: Anne Barnes, female    DOB: 02/04/1946, 75 y.o.   MRN: [id number]  Chief Complaint  Patient presents with  . 4 month follow up blood pressure    HPI Patient is in today for ***  Past Medical History:  Diagnosis Date  . Acute combined systolic and diastolic heart failure ([place]) 05/26/2015  . ALLERGIC RHINITIS   . Allergic urticaria 08/01/2014   Patient reports allergies to shrimp, egg whites, tomatoes, dairy   . 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   Office Spirometry 11/07/17-WNL-FVC 1.58/82%, FEV1 1.27/85%, ratio 0.80, FEF 25-75% 0.26/89%  . Bradycardia 02/25/2017  . Chiari malformation    Noted MRI brain 09/2013 - s/p neuro eval for same  . DIVERTICULOSIS, COLON   . Essential hypertension 08/28/2010   Qualifier: Diagnosis of  By: Marca Ancona RMA, Lucy     . Frequent headaches   . GERD   . H/O measles   . H/O mumps   . Hearing loss 01/18/2017  . History of chicken pox   . Hyperglycemia 03/05/2016  . HYPERTENSION   . Hypocalcemia 02/25/2017  . IBS (irritable bowel syndrome) 05/26/2015  . Nonspecific abnormal electrocardiogram (ECG) (EKG)   . OBSTRUCTIVE SLEEP APNEA 12/2008 dx   noncompliant with CPAP qhs  . Obstructive sleep apnea 08/28/2010   NPSG 12/2008:  AHI 13/hr with desats to 78%   . Preventative health care 06/11/2017  . Sciatica    right side  . Seasonal and perennial allergic rhinitis 08/28/2010   Allergy profile 03/11/14- positive especially for dust, cat and dog Food allergy profile- total IgE 86 with several food group elevations Sed rate-WNL     . Sinusitis 06/06/2017  . 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 Vaginitis 01/18/2017  . Vitamin D deficiency 06/11/2017    Past Surgical History:  Procedure Laterality Date  . ABDOMINAL HYSTERECTOMY  1986  . BREAST SURGERY  1973   Breast biopsy  . UMBILICAL HERNIA REPAIR      Family History  Problem Relation Age of Onset  . Arthritis Mother        died of complications from hip surgery  . Arthritis  Father   . Kidney disease Father   . Pneumonia Father   . Heart disease Father   . Lupus Daughter   . Rheum arthritis Daughter   . Sjogren's syndrome Daughter   . Fibromyalgia Daughter   . Diabetes Brother   . Heart disease Brother   . Heart attack Brother   . Alcohol abuse Brother   . Throat cancer Brother   . Lung cancer Brother   . Kidney disease Sister        dialysis  . Obesity Sister   . Hypertension Maternal Grandfather   . Hypertension Sister   . Kidney disease Sister        dialysis  . Pneumonia Brother   . Hypertension Daughter   . Graves' disease Daughter   . Hypertension Daughter   . Alcohol abuse Other        parent  . Ataxia Neg Hx   . Chorea Neg Hx   . Dementia Neg Hx   . Mental retardation Neg Hx   . Migraines Neg Hx   . Multiple sclerosis Neg Hx   . Neurofibromatosis Neg Hx   . Neuropathy Neg Hx   . Parkinsonism Neg Hx   . Seizures Neg Hx   . Stroke Neg Hx     Social History  Socioeconomic History  . Marital status: Widowed    Spouse name: Not on file  . Number of children: Not on file  . Years of education: Not on file  . Highest education level: Not on file  Occupational History  . Occupation: retired  Tobacco Use  . Smoking status: Never Smoker  . Smokeless tobacco: Never Used  . Tobacco comment: Married, lives with spouse, dtr 2 g-kids.  Vaping Use  . Vaping Use: Never used  Substance and Sexual Activity  . Alcohol use: No    Comment: rare  . Drug use: No  . Sexual activity: Not Currently    Comment: lives with husband, no dietary restrictions avoid foods allergic to, retired  Other Topics Concern  . Not on file  Social History Narrative   Right handed   One story with daughter and two grandkids   Social Determinants of Health   Financial Resource Strain: Low Risk   . Difficulty of Paying Living Expenses: Not hard at all  Food Insecurity: No Food Insecurity  . Worried About Charity fundraiser in the Last Year: Never true   . Ran Out of Food in the Last Year: Never true  Transportation Needs: No Transportation Needs  . Lack of Transportation (Medical): No  . Lack of Transportation (Non-Medical): No  Physical Activity: Not on file  Stress: Not on file  Social Connections: Not on file  Intimate Partner Violence: Not on file    Outpatient Medications Prior to Visit  Medication Sig Dispense Refill  . albuterol (VENTOLIN HFA) 108 (90 Base) MCG/ACT inhaler INHALE 2 PUFFS INTO THE LUNGS EVERY 6 HOURS AS NEEDED FOR WHEEZING FOR SHORTNESS OF BREATH 18 g 12  . ALPRAZolam (XANAX) 0.5 MG tablet TAKE 1 TABLET BY MOUTH TWICE DAILY AS NEEDED FOR ANXIETY AND FOR SLEEP 20 tablet 1  . amLODipine (NORVASC) 2.5 MG tablet Take 2.5 mg by mouth 2 (two) times daily.    Marland Kitchen azelastine (ASTELIN) 0.1 % nasal spray Place 2 sprays into both nostrils 2 (two) times daily. 30 mL 5  . b complex vitamins tablet Take 1 tablet by mouth daily.    . Cholecalciferol (VITAMIN D) 2000 UNITS CAPS Take by mouth daily.    Marland Kitchen EPINEPHrine 0.3 mg/0.3 mL IJ SOAJ injection Inject 0.3 mg into the muscle as needed for anaphylaxis. 2 each 1  . fluticasone (FLONASE) 50 MCG/ACT nasal spray Place 2 sprays into both nostrils daily. 16 g 6  . levocetirizine (XYZAL) 5 MG tablet TAKE 1 TABLET BY MOUTH ONCE DAILY IN THE EVENING 30 tablet 5  . losartan (COZAAR) 100 MG tablet Take 1 tablet (100 mg total) by mouth daily. 90 tablet 0  . montelukast (SINGULAIR) 10 MG tablet TAKE 1 TABLET BY MOUTH AT BEDTIME 90 tablet 0  . Multiple Vitamin (MULTIVITAMIN) tablet Take 1 tablet by mouth daily.    Marland Kitchen olopatadine (PATANOL) 0.1 % ophthalmic solution Place 1 drop into both eyes 2 (two) times daily. 5 mL 12  . omeprazole (PRILOSEC) 20 MG capsule Take 1 capsule by mouth once daily 90 capsule 0  . rosuvastatin (CRESTOR) 10 MG tablet Take 1 tablet (10 mg total) by mouth daily. 90 tablet 2  . spironolactone (ALDACTONE) 25 MG tablet Take 1 tablet (25 mg total) by mouth daily. 90 tablet 3   . topiramate (TOPAMAX) 25 MG tablet Take 1 tablet (25 mg total) by mouth at bedtime. 30 tablet 3   No facility-administered medications prior to visit.  Allergies  Allergen Reactions  . Codeine Nausea And Vomiting  . Hydralazine Hcl Other (See Comments)    Pt reports causes dizziness  . Lisinopril-Hydrochlorothiazide Hives    REACTION: lip swelling,facial swelling,rash.  . Metoprolol Other (See Comments)    Does not feel well on it   . Other Cough    Patient states she is allergic to Shrimp, egg whites, tomatoes and dairy products.  Says they make her feel unwell but do not cause hives, SOB or anaphylaxis.  She just avoids eating them because they can make her have a headache.  Strawberries, watermelon, peanuts, and all shellfish.    . Shrimp [Shellfish Allergy] Other (See Comments)    Was allergy tested and showed shrimp was one--has not reacted b/c avoided  . Tomato Other (See Comments)    ROS     Objective:    Physical Exam  BP (!) 140/47 (BP Location: Left Arm, Cuff Size: Large)   Pulse 82   Temp 98.2 F (36.8 C) (Oral)   Resp 12   Ht 4\' 11"  (1.499 m)   Wt 234 lb (106.1 kg)   SpO2 100%   BMI 47.26 kg/m  Wt Readings from Last 3 Encounters:  10/20/20 234 lb (106.1 kg)  08/04/20 230 lb 6.4 oz (104.5 kg)  06/08/20 222 lb 12.8 oz (101.1 kg)    Diabetic Foot Exam - Simple   No data filed    Lab Results  Component Value Date   WBC 9.5 02/17/2020   HGB 13.3 02/17/2020   HCT 40.4 02/17/2020   PLT 253 02/17/2020   GLUCOSE 90 02/17/2020   CHOL 116 02/17/2020   TRIG 39 02/17/2020   HDL 42 02/17/2020   LDLCALC 64 02/17/2020   ALT 32 02/17/2020   AST 16 02/17/2020   NA 142 02/17/2020   K 4.1 02/17/2020   CL 103 02/17/2020   CREATININE 1.00 02/17/2020   BUN 17 02/17/2020   CO2 24 02/17/2020   TSH 1.48 09/03/2019   INR 1.06 01/10/2015   HGBA1C 5.4 09/03/2019    Lab Results  Component Value Date   TSH 1.48 09/03/2019   Lab Results  Component Value  Date   WBC 9.5 02/17/2020   HGB 13.3 02/17/2020   HCT 40.4 02/17/2020   MCV 90 02/17/2020   PLT 253 02/17/2020   Lab Results  Component Value Date   NA 142 02/17/2020   K 4.1 02/17/2020   CO2 24 02/17/2020   GLUCOSE 90 02/17/2020   BUN 17 02/17/2020   CREATININE 1.00 02/17/2020   BILITOT 0.4 02/17/2020   ALKPHOS 99 02/17/2020   AST 16 02/17/2020   ALT 32 02/17/2020   PROT 7.3 02/17/2020   ALBUMIN 4.3 02/17/2020   CALCIUM 9.2 02/17/2020   ANIONGAP 9 09/15/2017   GFR 85.32 09/03/2019   Lab Results  Component Value Date   CHOL 116 02/17/2020   Lab Results  Component Value Date   HDL 42 02/17/2020   Lab Results  Component Value Date   LDLCALC 64 02/17/2020   Lab Results  Component Value Date   TRIG 39 02/17/2020   Lab Results  Component Value Date   CHOLHDL 2.8 02/17/2020   Lab Results  Component Value Date   HGBA1C 5.4 09/03/2019       Assessment & Plan:   Problem List Items Addressed This Visit    Essential hypertension    Well controlled, no changes to meds. Encouraged heart healthy diet such as the  DASH diet and exercise as tolerated. Her home numbers are good, mostly 120s over 60s.       Relevant Orders   CBC   Comprehensive metabolic panel   TSH   Hyperglycemia    hgba1c acceptable, minimize simple carbs. Increase exercise as tolerated.      Relevant Orders   Hemoglobin A1c   Vitamin D deficiency    Supplement and monitor      Relevant Orders   VITAMIN D 25 Hydroxy (Vit-D Deficiency, Fractures)   Obesity    Encouraged DASH or MIND diet, decrease po intake and increase exercise as tolerated. Needs 7-8 hours of sleep nightly. Avoid trans fats, eat small, frequent meals every 4-5 hours with lean proteins, complex carbs and healthy fats. Minimize simple carbs, GMO foods. She is not interested in referral to [facility] at this time.       Hyperlipidemia    Encouraged heart healthy diet, increase exercise, avoid trans fats, consider a krill oil cap  daily      Relevant Orders   Lipid panel    Other Visit Diagnoses    Need for influenza vaccination    -  Primary   Relevant Orders   Flu Vaccine MDCK QUAD PF (Completed)   Colon cancer screening       Relevant Orders   Ambulatory referral to Gastroenterology      I have discontinued Jana Half A. [place]kee's topiramate. I am also having her maintain her multivitamin, Vitamin D, b complex vitamins, omeprazole, fluticasone, olopatadine, rosuvastatin, azelastine, levocetirizine, albuterol, EPINEPHrine, ALPRAZolam, amLODipine, montelukast, spironolactone, and losartan.  No orders of the defined types were placed in this encounter.    Penni Homans, MD

## 2020-10-20 NOTE — Patient Instructions (Signed)
[email or web address]">  MIND or DASH Eating Plan DASH stands for Dietary Approaches to Stop Hypertension. The DASH eating plan is a healthy eating plan that has been shown to:  Reduce high blood pressure (hypertension).  Reduce your risk for type 2 diabetes, heart disease, and stroke.  Help with weight loss. What are tips for following this plan? Reading food labels  Check food labels for the amount of salt (sodium) per serving. Choose foods with less than 5 percent of the Daily Value of sodium. Generally, foods with less than 300 milligrams (mg) of sodium per serving fit into this eating plan.  To find whole grains, look for the word "whole" as the first word in the ingredient list. Shopping  Buy products labeled as "low-sodium" or "no salt added."  Buy fresh foods. Avoid canned foods and pre-made or frozen meals. Cooking  Avoid adding salt when cooking. Use salt-free seasonings or herbs instead of table salt or sea salt. Check with your health care provider or pharmacist before using salt substitutes.  Do not fry foods. Cook foods using healthy methods such as baking, boiling, grilling, roasting, and broiling instead.  Cook with heart-healthy oils, such as olive, canola, avocado, soybean, or sunflower oil. Meal planning  Eat a balanced diet that includes: ? 4 or more servings of fruits and 4 or more servings of vegetables each day. Try to fill one-half of your plate with fruits and vegetables. ? 6-8 servings of whole grains each day. ? Less than 6 oz (170 g) of lean meat, poultry, or fish each day. A 3-oz (85-g) serving of meat is about the same size as a deck of cards. One egg equals 1 oz (28 g). ? 2-3 servings of low-fat dairy each day. One serving is 1 cup (237 mL). ? 1 serving of nuts, seeds, or beans 5 times each week. ? 2-3 servings of heart-healthy fats. Healthy fats called omega-3 fatty acids are found in foods such as walnuts,  flaxseeds, fortified milks, and eggs. These fats are also found in cold-water fish, such as sardines, salmon, and mackerel.  Limit how much you eat of: ? Canned or prepackaged foods. ? Food that is high in trans fat, such as some fried foods. ? Food that is high in saturated fat, such as fatty meat. ? Desserts and other sweets, sugary drinks, and other foods with added sugar. ? Full-fat dairy products.  Do not salt foods before eating.  Do not eat more than 4 egg yolks a week.  Try to eat at least 2 vegetarian meals a week.  Eat more home-cooked food and less restaurant, buffet, and fast food.   Lifestyle  When eating at a restaurant, ask that your food be prepared with less salt or no salt, if possible.  If you drink alcohol: ? Limit how much you use to:  0-1 drink a day for women who are not pregnant.  0-2 drinks a day for men. ? Be aware of how much alcohol is in your drink. In the [place] one drink equals one 12 oz bottle of beer (355 mL), one 5 oz glass of wine (148 mL), or one 1 oz glass of hard liquor (44 mL). General information  Avoid eating more than 2,300 mg of salt a day. If you have hypertension, you may need to reduce your sodium intake to 1,500 mg a day.  Work with your health care provider to maintain a healthy body weight or to lose weight. Ask what an ideal weight  is for you.  Get at least 30 minutes of exercise that causes your heart to beat faster (aerobic exercise) most days of the week. Activities may include walking, swimming, or biking.  Work with your health care provider or dietitian to adjust your eating plan to your individual calorie needs. What foods should I eat? Fruits All fresh, dried, or frozen fruit. Canned fruit in natural juice (without added sugar). Vegetables Fresh or frozen vegetables (raw, steamed, roasted, or grilled). Low-sodium or reduced-sodium tomato and vegetable juice. Low-sodium or reduced-sodium tomato sauce and tomato paste.  Low-sodium or reduced-sodium canned vegetables. Grains Whole-grain or whole-wheat bread. Whole-grain or whole-wheat pasta. Brown rice. Anne Barnes. Bulgur. Whole-grain and low-sodium cereals. Pita bread. Low-fat, low-sodium crackers. Whole-wheat flour tortillas. Meats and other proteins Skinless chicken or [place]. Ground chicken or [place]. Pork with fat trimmed off. Fish and seafood. Egg whites. Dried beans, peas, or lentils. Unsalted nuts, nut butters, and seeds. Unsalted canned beans. Lean cuts of beef with fat trimmed off. Low-sodium, lean precooked or cured meat, such as sausages or meat loaves. Dairy Low-fat (1%) or fat-free (skim) milk. Reduced-fat, low-fat, or fat-free cheeses. Nonfat, low-sodium ricotta or cottage cheese. Low-fat or nonfat yogurt. Low-fat, low-sodium cheese. Fats and oils Soft margarine without trans fats. Vegetable oil. Reduced-fat, low-fat, or light mayonnaise and salad dressings (reduced-sodium). Canola, safflower, olive, avocado, soybean, and sunflower oils. Avocado. Seasonings and condiments Herbs. Spices. Seasoning mixes without salt. Other foods Unsalted popcorn and pretzels. Fat-free sweets. The items listed above may not be a complete list of foods and beverages you can eat. Contact a dietitian for more information. What foods should I avoid? Fruits Canned fruit in a light or heavy syrup. Fried fruit. Fruit in cream or butter sauce. Vegetables Creamed or fried vegetables. Vegetables in a cheese sauce. Regular canned vegetables (not low-sodium or reduced-sodium). Regular canned tomato sauce and paste (not low-sodium or reduced-sodium). Regular tomato and vegetable juice (not low-sodium or reduced-sodium). Anne Barnes. Olives. Grains Baked goods made with fat, such as croissants, muffins, or some breads. Dry pasta or rice meal packs. Meats and other proteins Fatty cuts of meat. Ribs. Fried meat. Anne Barnes. Bologna, salami, and other precooked or cured meats, such as  sausages or meat loaves. Fat from the back of a pig (fatback). Bratwurst. Salted nuts and seeds. Canned beans with added salt. Canned or smoked fish. Whole eggs or egg yolks. Chicken or [place] with skin. Dairy Whole or 2% milk, cream, and half-and-half. Whole or full-fat cream cheese. Whole-fat or sweetened yogurt. Full-fat cheese. Nondairy creamers. Whipped toppings. Processed cheese and cheese spreads. Fats and oils Butter. Stick margarine. Lard. Shortening. Ghee. Bacon fat. Tropical oils, such as coconut, palm kernel, or palm oil. Seasonings and condiments Onion salt, garlic salt, seasoned salt, table salt, and sea salt. Worcestershire sauce. Tartar sauce. Barbecue sauce. Teriyaki sauce. Soy sauce, including reduced-sodium. Steak sauce. Canned and packaged gravies. Fish sauce. Oyster sauce. Cocktail sauce. Store-bought horseradish. Ketchup. Mustard. Meat flavorings and tenderizers. Bouillon cubes. Hot sauces. Pre-made or packaged marinades. Pre-made or packaged taco seasonings. Relishes. Regular salad dressings. Other foods Salted popcorn and pretzels. The items listed above may not be a complete list of foods and beverages you should avoid. Contact a dietitian for more information. Where to find more information  National Heart, Lung, and Blood Institute: https://wilson-eaton.com/  American Heart Association: www.heart.org  Academy of Nutrition and Dietetics: www.eatright.[place]: www.kidney.org Summary  The DASH eating plan is a healthy eating plan that has been  shown to reduce high blood pressure (hypertension). It may also reduce your risk for type 2 diabetes, heart disease, and stroke.  When on the DASH eating plan, aim to eat more fresh fruits and vegetables, whole grains, lean proteins, low-fat dairy, and heart-healthy fats.  With the DASH eating plan, you should limit salt (sodium) intake to 2,300 mg a day. If you have hypertension, you may need to reduce your  sodium intake to 1,500 mg a day.  Work with your health care provider or dietitian to adjust your eating plan to your individual calorie needs. This information is not intended to replace advice given to you by your health care provider. Make sure you discuss any questions you have with your health care provider. Document Revised: 08/14/2019 Document Reviewed: 08/14/2019 Elsevier Patient Education  2021 [employer].

## 2020-10-20 NOTE — Assessment & Plan Note (Signed)
Supplement and monitor 

## 2020-10-20 NOTE — Assessment & Plan Note (Signed)
Encouraged heart healthy diet, increase exercise, avoid trans fats, consider a krill oil cap daily 

## 2020-10-20 NOTE — Assessment & Plan Note (Addendum)
Well controlled, no changes to meds. Encouraged heart healthy diet such as the DASH diet and exercise as tolerated. Her home numbers are good, mostly 120s over 60s.

## 2020-10-20 NOTE — Assessment & Plan Note (Signed)
hgba1c acceptable, minimize simple carbs. Increase exercise as tolerated.  

## 2020-10-20 NOTE — Assessment & Plan Note (Signed)
Encouraged DASH or MIND diet, decrease po intake and increase exercise as tolerated. Needs 7-8 hours of sleep nightly. Avoid trans fats, eat small, frequent meals every 4-5 hours with lean proteins, complex carbs and healthy fats. Minimize simple carbs, GMO foods. She is not interested in referral to [facility] at this time.

## 2020-10-21 NOTE — Progress Notes (Signed)
Patient ID: Anne Barnes, female   DOB: [birth year], 75 y.o.   MRN: [id number]   Subjective:    Patient ID: Anne Barnes, female    DOB: 15-Jun-1946, 75 y.o.   MRN: [id number]  Chief Complaint  Patient presents with  . 4 month follow up blood pressure    HPI Patient is in today for follow up on chronic medical concerns. No recent febrile illness or hospitalizations. No polyuria or polydipsia. She is a little sad today as it would have been her anniversary but overall she is doing well with the support of her children despite being widowed about a year and a half ago. Denies CP/palp/SOB/HA/congestion/fevers/GI or GU c/o. Taking meds as prescribed. She is trying to stay active and maintain a heart healthy diet.   Past Medical History:  Diagnosis Date  . Acute combined systolic and diastolic heart failure ([place]) 05/26/2015  . ALLERGIC RHINITIS   . Allergic urticaria 08/01/2014   Patient reports allergies to shrimp, egg whites, tomatoes, dairy   . 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   Office Spirometry 11/07/17-WNL-FVC 1.58/82%, FEV1 1.27/85%, ratio 0.80, FEF 25-75% 0.26/89%  . Bradycardia 02/25/2017  . Chiari malformation    Noted MRI brain 09/2013 - s/p neuro eval for same  . DIVERTICULOSIS, COLON   . Essential hypertension 08/28/2010   Qualifier: Diagnosis of  By: Marca Ancona RMA, Lucy     . Frequent headaches   . GERD   . H/O measles   . H/O mumps   . Hearing loss 01/18/2017  . History of chicken pox   . Hyperglycemia 03/05/2016  . HYPERTENSION   . Hypocalcemia 02/25/2017  . IBS (irritable bowel syndrome) 05/26/2015  . Nonspecific abnormal electrocardiogram (ECG) (EKG)   . OBSTRUCTIVE SLEEP APNEA 12/2008 dx   noncompliant with CPAP qhs  . Obstructive sleep apnea 08/28/2010   NPSG 12/2008:  AHI 13/hr with desats to 78%   . Preventative health care 06/11/2017  . Sciatica    right side  . Seasonal and perennial allergic rhinitis 08/28/2010   Allergy profile 03/11/14- positive especially for dust, cat  and dog Food allergy profile- total IgE 86 with several food group elevations Sed rate-WNL     . Sinusitis 06/06/2017  . 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 Vaginitis 01/18/2017  . Vitamin D deficiency 06/11/2017    Past Surgical History:  Procedure Laterality Date  . ABDOMINAL HYSTERECTOMY  1986  . BREAST SURGERY  1973   Breast biopsy  . UMBILICAL HERNIA REPAIR      Family History  Problem Relation Age of Onset  . Arthritis Mother        died of complications from hip surgery  . Arthritis Father   . Kidney disease Father   . Pneumonia Father   . Heart disease Father   . Lupus Daughter   . Rheum arthritis Daughter   . Sjogren's syndrome Daughter   . Fibromyalgia Daughter   . Diabetes Brother   . Heart disease Brother   . Heart attack Brother   . Alcohol abuse Brother   . Throat cancer Brother   . Lung cancer Brother   . Kidney disease Sister        dialysis  . Obesity Sister   . Hypertension Maternal Grandfather   . Hypertension Sister   . Kidney disease Sister        dialysis  . Pneumonia Brother   . Hypertension Daughter   . Graves' disease Daughter   .  Hypertension Daughter   . Alcohol abuse Other        parent  . Ataxia Neg Hx   . Chorea Neg Hx   . Dementia Neg Hx   . Mental retardation Neg Hx   . Migraines Neg Hx   . Multiple sclerosis Neg Hx   . Neurofibromatosis Neg Hx   . Neuropathy Neg Hx   . Parkinsonism Neg Hx   . Seizures Neg Hx   . Stroke Neg Hx     Social History   Socioeconomic History  . Marital status: Widowed    Spouse name: Not on file  . Number of children: Not on file  . Years of education: Not on file  . Highest education level: Not on file  Occupational History  . Occupation: retired  Tobacco Use  . Smoking status: Never Smoker  . Smokeless tobacco: Never Used  . Tobacco comment: Married, lives with spouse, dtr 2 g-kids.  Vaping Use  . Vaping Use: Never used  Substance and Sexual Activity  . Alcohol use: No    Comment:  rare  . Drug use: No  . Sexual activity: Not Currently    Comment: lives with husband, no dietary restrictions avoid foods allergic to, retired  Other Topics Concern  . Not on file  Social History Narrative   Right handed   One story with daughter and two grandkids   Social Determinants of Health   Financial Resource Strain: Low Risk   . Difficulty of Paying Living Expenses: Not hard at all  Food Insecurity: No Food Insecurity  . Worried About Charity fundraiser in the Last Year: Never true  . Ran Out of Food in the Last Year: Never true  Transportation Needs: No Transportation Needs  . Lack of Transportation (Medical): No  . Lack of Transportation (Non-Medical): No  Physical Activity: Not on file  Stress: Not on file  Social Connections: Not on file  Intimate Partner Violence: Not on file    Outpatient Medications Prior to Visit  Medication Sig Dispense Refill  . albuterol (VENTOLIN HFA) 108 (90 Base) MCG/ACT inhaler INHALE 2 PUFFS INTO THE LUNGS EVERY 6 HOURS AS NEEDED FOR WHEEZING FOR SHORTNESS OF BREATH 18 g 12  . ALPRAZolam (XANAX) 0.5 MG tablet TAKE 1 TABLET BY MOUTH TWICE DAILY AS NEEDED FOR ANXIETY AND FOR SLEEP 20 tablet 1  . amLODipine (NORVASC) 2.5 MG tablet Take 2.5 mg by mouth 2 (two) times daily.    Marland Kitchen azelastine (ASTELIN) 0.1 % nasal spray Place 2 sprays into both nostrils 2 (two) times daily. 30 mL 5  . b complex vitamins tablet Take 1 tablet by mouth daily.    . Cholecalciferol (VITAMIN D) 2000 UNITS CAPS Take by mouth daily.    Marland Kitchen EPINEPHrine 0.3 mg/0.3 mL IJ SOAJ injection Inject 0.3 mg into the muscle as needed for anaphylaxis. 2 each 1  . fluticasone (FLONASE) 50 MCG/ACT nasal spray Place 2 sprays into both nostrils daily. 16 g 6  . levocetirizine (XYZAL) 5 MG tablet TAKE 1 TABLET BY MOUTH ONCE DAILY IN THE EVENING 30 tablet 5  . losartan (COZAAR) 100 MG tablet Take 1 tablet (100 mg total) by mouth daily. 90 tablet 0  . montelukast (SINGULAIR) 10 MG tablet  TAKE 1 TABLET BY MOUTH AT BEDTIME 90 tablet 0  . Multiple Vitamin (MULTIVITAMIN) tablet Take 1 tablet by mouth daily.    Marland Kitchen olopatadine (PATANOL) 0.1 % ophthalmic solution Place 1 drop into both eyes  2 (two) times daily. 5 mL 12  . omeprazole (PRILOSEC) 20 MG capsule Take 1 capsule by mouth once daily 90 capsule 0  . rosuvastatin (CRESTOR) 10 MG tablet Take 1 tablet (10 mg total) by mouth daily. 90 tablet 2  . spironolactone (ALDACTONE) 25 MG tablet Take 1 tablet (25 mg total) by mouth daily. 90 tablet 3  . topiramate (TOPAMAX) 25 MG tablet Take 1 tablet (25 mg total) by mouth at bedtime. 30 tablet 3   No facility-administered medications prior to visit.    Allergies  Allergen Reactions  . Codeine Nausea And Vomiting  . Eggs Or Egg-Derived Products   . Hydralazine Hcl Other (See Comments)    Pt reports causes dizziness  . Lisinopril-Hydrochlorothiazide Hives    REACTION: lip swelling,facial swelling,rash.  . Metoprolol Other (See Comments)    Does not feel well on it   . Other Cough    Patient states she is allergic to Shrimp, egg whites, tomatoes and dairy products.  Says they make her feel unwell but do not cause hives, SOB or anaphylaxis.  She just avoids eating them because they can make her have a headache.  Strawberries, watermelon, peanuts, and all shellfish.    . Shrimp [Shellfish Allergy] Other (See Comments)    Was allergy tested and showed shrimp was one--has not reacted b/c avoided  . Tomato Other (See Comments)    Review of Systems  Constitutional: Negative for fever and malaise/fatigue.  HENT: Negative for congestion.   Eyes: Negative for blurred vision.  Respiratory: Negative for shortness of breath.   Cardiovascular: Negative for chest pain, palpitations and leg swelling.  Gastrointestinal: Negative for abdominal pain, blood in stool and nausea.  Genitourinary: Negative for dysuria and frequency.  Musculoskeletal: Negative for falls.  Skin: Negative for rash.   Neurological: Negative for dizziness, loss of consciousness and headaches.  Endo/Heme/Allergies: Negative for environmental allergies.  Psychiatric/Behavioral: Negative for depression. The patient is not nervous/anxious.        Objective:    Physical Exam Vitals and nursing note reviewed.  Constitutional:      General: She is not in acute distress.    Appearance: She is well-developed and well-nourished.  HENT:     Head: Normocephalic and atraumatic.     Nose: Nose normal.  Eyes:     General:        Right eye: No discharge.        Left eye: No discharge.  Cardiovascular:     Rate and Rhythm: Normal rate and regular rhythm.     Heart sounds: No murmur heard.   Pulmonary:     Effort: Pulmonary effort is normal.     Breath sounds: Normal breath sounds.  Abdominal:     General: Bowel sounds are normal.     Palpations: Abdomen is soft.     Tenderness: There is no abdominal tenderness.  Musculoskeletal:        General: No edema.     Cervical back: Normal range of motion and neck supple.  Skin:    General: Skin is warm and dry.  Neurological:     Mental Status: She is alert and oriented to person, place, and time.  Psychiatric:        Mood and Affect: Mood and affect normal.     BP (!) 140/47 (BP Location: Left Arm, Cuff Size: Large)   Pulse 82   Temp 98.2 F (36.8 C) (Oral)   Resp 12   Ht 4'  11" (1.499 m)   Wt 234 lb (106.1 kg)   SpO2 100%   BMI 47.26 kg/m  Wt Readings from Last 3 Encounters:  10/20/20 234 lb (106.1 kg)  08/04/20 230 lb 6.4 oz (104.5 kg)  06/08/20 222 lb 12.8 oz (101.1 kg)    Diabetic Foot Exam - Simple   No data filed    Lab Results  Component Value Date   WBC 8.8 10/20/2020   HGB 12.9 10/20/2020   HCT 39.2 10/20/2020   PLT 245.0 10/20/2020   GLUCOSE 89 10/20/2020   CHOL 128 10/20/2020   TRIG 45.0 10/20/2020   HDL 48.40 10/20/2020   LDLCALC 71 10/20/2020   ALT 27 10/20/2020   AST 16 10/20/2020   NA 141 10/20/2020   K 4.1  10/20/2020   CL 105 10/20/2020   CREATININE 0.89 10/20/2020   BUN 20 10/20/2020   CO2 31 10/20/2020   TSH 1.11 10/20/2020   INR 1.06 01/10/2015   HGBA1C 5.5 10/20/2020    Lab Results  Component Value Date   TSH 1.11 10/20/2020   Lab Results  Component Value Date   WBC 8.8 10/20/2020   HGB 12.9 10/20/2020   HCT 39.2 10/20/2020   MCV 92.0 10/20/2020   PLT 245.0 10/20/2020   Lab Results  Component Value Date   NA 141 10/20/2020   K 4.1 10/20/2020   CO2 31 10/20/2020   GLUCOSE 89 10/20/2020   BUN 20 10/20/2020   CREATININE 0.89 10/20/2020   BILITOT 0.4 10/20/2020   ALKPHOS 87 10/20/2020   AST 16 10/20/2020   ALT 27 10/20/2020   PROT 7.2 10/20/2020   ALBUMIN 4.0 10/20/2020   CALCIUM 9.3 10/20/2020   ANIONGAP 9 09/15/2017   GFR 64.46 10/20/2020   Lab Results  Component Value Date   CHOL 128 10/20/2020   Lab Results  Component Value Date   HDL 48.40 10/20/2020   Lab Results  Component Value Date   LDLCALC 71 10/20/2020   Lab Results  Component Value Date   TRIG 45.0 10/20/2020   Lab Results  Component Value Date   CHOLHDL 3 10/20/2020   Lab Results  Component Value Date   HGBA1C 5.5 10/20/2020       Assessment & Plan:   Problem List Items Addressed This Visit    Essential hypertension    Well controlled, no changes to meds. Encouraged heart healthy diet such as the DASH diet and exercise as tolerated. Her home numbers are good, mostly 120s over 60s.       Relevant Orders   CBC (Completed)   Comprehensive metabolic panel (Completed)   TSH (Completed)   Hyperglycemia    hgba1c acceptable, minimize simple carbs. Increase exercise as tolerated.      Relevant Orders   Hemoglobin A1c (Completed)   Vitamin D deficiency    Supplement and monitor      Relevant Orders   VITAMIN D 25 Hydroxy (Vit-D Deficiency, Fractures) (Completed)   Obesity    Encouraged DASH or MIND diet, decrease po intake and increase exercise as tolerated. Needs 7-8 hours  of sleep nightly. Avoid trans fats, eat small, frequent meals every 4-5 hours with lean proteins, complex carbs and healthy fats. Minimize simple carbs, GMO foods. She is not interested in referral to [facility] at this time.       Hyperlipidemia    Encouraged heart healthy diet, increase exercise, avoid trans fats, consider a krill oil cap daily      Relevant  Orders   Lipid panel (Completed)    Other Visit Diagnoses    Need for influenza vaccination    -  Primary   Relevant Orders   Flu Vaccine MDCK QUAD PF (Completed)   Colon cancer screening       Relevant Orders   Ambulatory referral to Gastroenterology      I have discontinued Jana Half A. Nadal's topiramate. I am also having her maintain her multivitamin, Vitamin D, b complex vitamins, omeprazole, fluticasone, olopatadine, rosuvastatin, azelastine, levocetirizine, albuterol, EPINEPHrine, ALPRAZolam, amLODipine, montelukast, spironolactone, and losartan.  No orders of the defined types were placed in this encounter.    Penni Homans, MD

## 2020-10-24 ENCOUNTER — Ambulatory Visit (INDEPENDENT_AMBULATORY_CARE_PROVIDER_SITE_OTHER): Payer: Medicare HMO

## 2020-10-24 DIAGNOSIS — J309 Allergic rhinitis, unspecified: Secondary | ICD-10-CM

## 2020-10-25 ENCOUNTER — Encounter: Payer: Self-pay | Admitting: Gastroenterology

## 2020-10-28 ENCOUNTER — Telehealth: Payer: Self-pay | Admitting: Internal Medicine

## 2020-10-28 DIAGNOSIS — G4733 Obstructive sleep apnea (adult) (pediatric): Secondary | ICD-10-CM

## 2020-10-28 NOTE — Telephone Encounter (Signed)
Primary Pulmonologist: CY   Last office visit and with whom: 06/08/2020 What do we see them for (pulmonary problems): OSA Last OV assessment/plan:  We can continue CPAP 5-15  I hope you can find ways to sleep a little more with your CPAP on, at night or with your naps.  Ok to try an otc sleep aid occasionally to see if that helps.  Plan on getting your flu vaccine this Fall.  Albuterol inhaler refill sent to drug store  Please call if we can helpWe can continue CPAP 5-15  I hope you can find ways to sleep a little more with your CPAP on, at night or with your naps.  Ok to try an otc sleep aid occasionally to see if that helps.  Plan on getting your flu vaccine this Fall.  Albuterol inhaler refill sent to drug store  Please call if we can help  Was appointment offered to patient (explain)?  no   Reason for call: pt stated that she is waking up with a headache each morning after using the cpap.  She didn't use the cpap for a week and did not have a headache.  She is also having more mucus in the mornings in her throat.  Wanted to see if she could get recs from [facility]?  Does her cpap need to be adjusted?    (examples of things to ask: : When did symptoms start? Fever? Cough? Productive? Color to sputum? More sputum than usual? Wheezing? Have you needed increased oxygen? Are you taking your respiratory medications? What over the counter measures have you tried?)  Allergies  Allergen Reactions  . Codeine Nausea And Vomiting  . Eggs Or Egg-Derived Products   . Hydralazine Hcl Other (See Comments)    Pt reports causes dizziness  . Lisinopril-Hydrochlorothiazide Hives    REACTION: lip swelling,facial swelling,rash.  . Metoprolol Other (See Comments)    Does not feel well on it   . Other Cough    Patient states she is allergic to Shrimp, egg whites, tomatoes and dairy products.  Says they make her feel unwell but do not cause hives, SOB or anaphylaxis.  She just avoids eating  them because they can make her have a headache.  Strawberries, watermelon, peanuts, and all shellfish.    . Shrimp [Shellfish Allergy] Other (See Comments)    Was allergy tested and showed shrimp was one--has not reacted b/c avoided  . Tomato Other (See Comments)    Immunization History  Administered Date(s) Administered  . Influenza Inj Mdck Quad Pf 10/20/2020  . Influenza, High Dose Seasonal PF 07/15/2013, 06/06/2018  . Influenza,inj,Quad PF,6+ Mos 08/29/2015  . Moderna Sars-Covid-2 Vaccination 11/26/2019, 12/24/2019, 10/06/2020  . Pneumococcal Conjugate-13 11/30/2015  . Pneumococcal Polysaccharide-23 02/26/2013  . Td 09/25/2003  . Tdap 08/29/2015

## 2020-10-28 NOTE — Telephone Encounter (Signed)
I have sent the order over for DME to change her settings and she is aware of this. Nothing further is needed.

## 2020-10-28 NOTE — Telephone Encounter (Signed)
Please order DME [employer] [employer]- reduce CPAP auto range to 5-12

## 2020-10-31 ENCOUNTER — Ambulatory Visit (INDEPENDENT_AMBULATORY_CARE_PROVIDER_SITE_OTHER): Payer: Medicare HMO | Admitting: *Deleted

## 2020-10-31 DIAGNOSIS — J309 Allergic rhinitis, unspecified: Secondary | ICD-10-CM | POA: Diagnosis not present

## 2020-11-04 ENCOUNTER — Other Ambulatory Visit: Payer: Self-pay | Admitting: Cardiology

## 2020-11-08 ENCOUNTER — Ambulatory Visit (INDEPENDENT_AMBULATORY_CARE_PROVIDER_SITE_OTHER): Payer: Medicare HMO | Admitting: *Deleted

## 2020-11-08 DIAGNOSIS — J309 Allergic rhinitis, unspecified: Secondary | ICD-10-CM | POA: Diagnosis not present

## 2020-11-14 ENCOUNTER — Ambulatory Visit (AMBULATORY_SURGERY_CENTER): Payer: Self-pay

## 2020-11-14 ENCOUNTER — Other Ambulatory Visit: Payer: Self-pay

## 2020-11-14 VITALS — Ht 59.0 in | Wt 230.0 lb

## 2020-11-14 DIAGNOSIS — Z1211 Encounter for screening for malignant neoplasm of colon: Secondary | ICD-10-CM

## 2020-11-14 MED ORDER — NA SULFATE-K SULFATE-MG SULF 17.5-3.13-1.6 GM/177ML PO SOLN: 1.0000 | Freq: Once | ORAL | 0 refills | Status: AC

## 2020-11-14 NOTE — Progress Notes (Signed)
No egg or soy allergy known to patient  No issues with past sedation with any surgeries or procedures No intubation problems in the past  No FH of Malignant Hyperthermia No diet pills per patient No home 02 use per patient  No blood thinners per patient  Pt states occasional issues with constipation , 2 DAY PREP ORDERED.  No A fib or A flutter  EMMI video to pt or via [place] guidelines implemented in PV today with Pt and RN  Pt is fully vaccinated  for Covid  Pt denies loose or missing teeth, denies dentures, partials, dental implants, capped or bonded teeth    NO PA's for preps discussed with pt In PV today  Discussed with pt there will be an out-of-pocket cost for prep and that varies from $0 to 70 dollars   Due to the COVID-19 pandemic we are asking patients to follow certain guidelines.  Pt aware of COVID protocols and [facility] guidelines

## 2020-11-15 ENCOUNTER — Other Ambulatory Visit: Payer: Self-pay | Admitting: Occupational Therapy

## 2020-11-15 NOTE — Patient Instructions (Signed)
Feel more safe and independent with showering (shower seat, grab bars, and handheld shower would A with this)   ACTION PLANNING - BATHING  Target Problem Area: Decreased safety with showering  Why Problem May Occur: No grab bars, no shower seat, no handheld shower  Target Goal: Safety and independence with showering   STRATEGIES Saving Your Energy: DO: DON'T:  Use a shower seat Minimize standing while bathing, it uses more energy  Keep all items you'll need within easy reach   Plan to bathe/shower before you're overly tired Rush   Modifying your home environment and making it safe: DO: DON'T:  Use grab bars in the shower    Place a rubber mat the size you need in the shower Place loose rugs in the bathroom- they can trip you or your walker/cane can get caught on them  Make sure the bathroom is well lit    Use handheld shower head    Simplifying the way you set up tasks or daily routines: DO: DON'T:  Gather all items before getting started       Practice It is important to practice the strategies so we can determine if they will be effective in helping to reach your goal. Follow these specific recommendations: 1. Use the shower seat provided today. 2.Continue to use the grab bars/hand held shower 3. Use a non-skid mat or non-slip socks (you showed me) when in the shower.  If a strategy does not work the first time, try it again and again (and maybe again). [place], OTR/L       11/15/2020

## 2020-11-15 NOTE — Patient Outreach (Signed)
Aging Gracefully Program  OT Follow-Up Visit  11/15/2020  Anne Barnes 17-Aug-1946 [id number]  Visit:  4- Fourth Visit  Start Time:  8127 End Time:  5170 Total Minutes:  45  Readiness to Change Score :  Readiness to Change Score: 10   Durable Medical Equipment: Durable Medical Equipment: Shower Chair With Back Durable Medical Equipment Distribution Date: 11/15/20   Goals:  Goals Addressed            This Visit's Progress   . COMPLETED: Patient Stated       Feel more safe and independent with showering (shower seat, grab bars, and handheld shower would A with this)   ACTION PLANNING - BATHING  Target Problem Area: Decreased safety with showering  Why Problem May Occur: No grab bars, no shower seat, no handheld shower  Target Goal: Safety and independence with showering   STRATEGIES Saving Your Energy: DO: DON'T:  Use a shower seat Minimize standing while bathing, it uses more energy  Keep all items you'll need within easy reach   Plan to bathe/shower before you're overly tired Rush   Modifying your home environment and making it safe: DO: DON'T:  Use grab bars in the shower    Place a rubber mat the size you need in the shower Place loose rugs in the bathroom- they can trip you or your walker/cane can get caught on them  Make sure the bathroom is well lit    Use handheld shower head    Simplifying the way you set up tasks or daily routines: DO: DON'T:  Gather all items before getting started       Practice It is important to practice the strategies so we can determine if they will be effective in helping to reach your goal. Follow these specific recommendations: 1. Use the shower seat provided today. 2.Continue to use the grab bars/hand held shower 3. Use a non-skid mat or non-slip socks (you showed me) when in the shower.  If a strategy does not work the first time, try it again and again (and maybe again). [place], OTR/L       11/15/2020        Post Clinical Reasoning: Client Action (Goal) Two Interventions: Client provided with shower seat with back Did Client Try?: Yes Targeted Problem Area Status: A Lot Better Clinician View Of Client Situation:: Ms. Sharpnack is most appreciative of the AG program all they have done for her thus far. The shower seat will make showering safer for her along with the grab bars and hand held shower that have now been installed by [facility]. Client View Of His/Her Situation:: She stated that she feels teh AG program has helped her to feel more safe in her home as well as made her mobile (rollator)

## 2020-11-16 ENCOUNTER — Encounter: Payer: Self-pay | Admitting: Allergy

## 2020-11-16 ENCOUNTER — Other Ambulatory Visit: Payer: Self-pay

## 2020-11-16 ENCOUNTER — Ambulatory Visit: Payer: Medicare HMO | Admitting: Allergy

## 2020-11-16 ENCOUNTER — Ambulatory Visit: Payer: Self-pay

## 2020-11-16 VITALS — BP 138/70 | HR 66 | Temp 97.7°F | Resp 12 | Ht 59.0 in | Wt 232.2 lb

## 2020-11-16 DIAGNOSIS — T7800XD Anaphylactic reaction due to unspecified food, subsequent encounter: Secondary | ICD-10-CM

## 2020-11-16 DIAGNOSIS — J4520 Mild intermittent asthma, uncomplicated: Secondary | ICD-10-CM

## 2020-11-16 DIAGNOSIS — J3089 Other allergic rhinitis: Secondary | ICD-10-CM

## 2020-11-16 DIAGNOSIS — T7800XA Anaphylactic reaction due to unspecified food, initial encounter: Secondary | ICD-10-CM

## 2020-11-16 DIAGNOSIS — J309 Allergic rhinitis, unspecified: Secondary | ICD-10-CM

## 2020-11-16 NOTE — Patient Instructions (Addendum)
-   continue avoidance measures for dust mites, cat and dog.   - continue Xyzal 5mg  daily  - continue Singulair 10mg  daily - for nasal drainage and post-nasal drip continue use of nasal antihistamine, Astelin 2 sprays each nostril 1-2 times a day as needed.  This nasal spray is different than Xhance.   - recommend performing nasal saline rinses prior to use of medicated nasal sprays to help flush out the sinuses.  Use with distilled water at room temperature.  Breathe in and out through your mouth during the entire process. - continue allergy injections per schedule.  You are still in the build-up phase.  - continue food avoidance of shellfish, gluten, soybean, peanut  - skin testing to these foods were negative at initial visit.   - have access to your epinephrine device in case of allergic reaction.   - follow emergency action plan in case of allergic reaction   - have access to albuterol inhaler 2 puffs every 4-6 hours as needed for cough/wheeze/shortness of breath/chest tightness.  May use 15-20 minutes prior to activity.   Monitor frequency of use.    Follow-up 6 months or sooner if needed

## 2020-11-16 NOTE — Progress Notes (Signed)
Follow-up Note  RE: Anne Barnes MRN: [id number] DOB: 03-23-1946 Date of Office Visit: 11/16/2020   History of present illness: Anne Barnes is a 74 y.o. female presenting today for follow-up of chronic allergic rhinosinusitis, food allergy and intermittent asthma.  He was last seen in the office on 05/13/2020 by myself. She states she is about the same as she was at her last visit but does note that she has days where she does feel like her symptoms are a lot less.  She still has mucus that drains into her throat.  She will use the Astelin however feels like the drainage is very little at this point in time.  She tried the [facility] and did not feel it made any difference with her nasal symptoms.  She does continue on Xyzal and Singulair.  She does feel that Xyzal is more effective than the antihistamine she has tried in the past.  She is on allergy immunotherapy and tolerating these well without any large local or systemic reactions.  She is still currently building up. She continues to avoid shellfish, soybean and peanut and she has greatly limit her gluten ingestion.  She has access to an epinephrine device which she has not needed to use. She states she uses the albuterol less than twice a week on average.  And most week she states she does not need to use it at all.  She has had no ED or urgent care visits for any breathing issues nor has she required any systemic steroids.  Review of systems: Review of Systems  Constitutional: Negative.   HENT: Positive for congestion.   Eyes: Negative.   Respiratory: Negative.   Cardiovascular: Negative.   Gastrointestinal: Negative.   Musculoskeletal: Negative.   Skin: Negative.   Neurological: Negative.     All other systems negative unless noted above in HPI  Past medical/social/surgical/family history have been reviewed and are unchanged unless specifically indicated below.  No changes  Medication List: Current Outpatient Medications   Medication Sig Dispense Refill  . albuterol (VENTOLIN HFA) 108 (90 Base) MCG/ACT inhaler INHALE 2 PUFFS INTO THE LUNGS EVERY 6 HOURS AS NEEDED FOR WHEEZING FOR SHORTNESS OF BREATH 18 g 12  . ALPRAZolam (XANAX) 0.5 MG tablet TAKE 1 TABLET BY MOUTH TWICE DAILY AS NEEDED FOR ANXIETY AND FOR SLEEP 20 tablet 1  . amLODipine (NORVASC) 2.5 MG tablet Take 2.5 mg by mouth 2 (two) times daily.    Marland Kitchen azelastine (ASTELIN) 0.1 % nasal spray Place 2 sprays into both nostrils 2 (two) times daily. 30 mL 5  . b complex vitamins tablet Take 1 tablet by mouth daily.    . Cholecalciferol (VITAMIN D) 2000 UNITS CAPS Take by mouth daily.    Marland Kitchen EPINEPHrine 0.3 mg/0.3 mL IJ SOAJ injection Inject 0.3 mg into the muscle as needed for anaphylaxis. 2 each 1  . fluticasone (FLONASE) 50 MCG/ACT nasal spray Place 2 sprays into both nostrils daily. 16 g 6  . levocetirizine (XYZAL) 5 MG tablet TAKE 1 TABLET BY MOUTH ONCE DAILY IN THE EVENING 30 tablet 5  . losartan (COZAAR) 100 MG tablet Take 1 tablet (100 mg total) by mouth daily. 90 tablet 0  . montelukast (SINGULAIR) 10 MG tablet TAKE 1 TABLET BY MOUTH AT BEDTIME 90 tablet 0  . Multiple Vitamin (MULTIVITAMIN) tablet Take 1 tablet by mouth daily.    Marland Kitchen olopatadine (PATANOL) 0.1 % ophthalmic solution Place 1 drop into both eyes 2 (two) times daily.  5 mL 12  . omeprazole (PRILOSEC) 20 MG capsule Take 1 capsule by mouth once daily 90 capsule 0  . rosuvastatin (CRESTOR) 10 MG tablet Take 1 tablet by mouth once daily 90 tablet 3  . spironolactone (ALDACTONE) 25 MG tablet Take 1 tablet (25 mg total) by mouth daily. 90 tablet 3  . VITAMIN D, CHOLECALCIFEROL, PO Take by mouth.     No current facility-administered medications for this visit.     Known medication allergies: Allergies  Allergen Reactions  . Codeine Nausea And Vomiting  . Eggs Or Egg-Derived Products   . Hydralazine Hcl Other (See Comments)    Pt reports causes dizziness  . Lisinopril-Hydrochlorothiazide Hives     REACTION: lip swelling,facial swelling,rash.  . Metoprolol Other (See Comments)    Does not feel well on it   . Other Cough    Patient states she is allergic to Shrimp, egg whites, tomatoes and dairy products.  Says they make her feel unwell but do not cause hives, SOB or anaphylaxis.  She just avoids eating them because they can make her have a headache.  Strawberries, watermelon, peanuts, and all shellfish.    . Shrimp [Shellfish Allergy] Other (See Comments)    Was allergy tested and showed shrimp was one--has not reacted b/c avoided  . Tomato Other (See Comments)     Physical examination: Blood pressure 138/70, pulse 66, temperature 97.7 F (36.5 C), resp. rate 12, height 4\' 11"  (1.499 m), weight 232 lb 3.2 oz (105.3 kg), SpO2 99 %.  General: Alert, interactive, in no acute distress. HEENT: PERRLA, TMs pearly gray, turbinates non-edematous without discharge, post-pharynx non erythematous. Neck: Supple without lymphadenopathy. Lungs: Clear to auscultation without wheezing, rhonchi or rales. {no increased work of breathing. CV: Normal S1, S2 without murmurs. Abdomen: Nondistended, nontender. Skin: Warm and dry, without lesions or rashes. Extremities:  No clubbing, cyanosis or edema. Neuro:   Grossly intact.  Diagnositics/Labs: Received her allergen injection today  Assessment and plan:   Chronic rhinosinusitis, allergic - continue avoidance measures for dust mites, cat and dog.   - continue Xyzal 5mg  daily  - continue Singulair 10mg  daily - for nasal drainage and post-nasal drip continue use of nasal antihistamine, Astelin 2 sprays each nostril 1-2 times a day as needed.  This nasal spray is different than Xhance.   - recommend performing nasal saline rinses prior to use of medicated nasal sprays to help flush out the sinuses.  Use with distilled water at room temperature.  Breathe in and out through your mouth during the entire process. - continue allergy injections per  schedule.  You are still in the build-up phase.  Anaphylaxis due to food - continue food avoidance of shellfish, gluten, soybean, peanut  - skin testing to these foods were negative at initial visit.   - have access to your epinephrine device in case of allergic reaction.   - follow emergency action plan in case of allergic reaction   Intermittent asthma - have access to albuterol inhaler 2 puffs every 4-6 hours as needed for cough/wheeze/shortness of breath/chest tightness.  May use 15-20 minutes prior to activity.   Monitor frequency of use.    Follow-up 6 months or sooner if needed I appreciate the opportunity to take part in [place] care. Please do not hesitate to contact me with questions.  Sincerely,   Prudy Feeler, MD Allergy/Immunology [facility] [place] [facility] [place]

## 2020-11-23 ENCOUNTER — Ambulatory Visit (INDEPENDENT_AMBULATORY_CARE_PROVIDER_SITE_OTHER): Payer: Medicare HMO | Admitting: *Deleted

## 2020-11-23 DIAGNOSIS — J309 Allergic rhinitis, unspecified: Secondary | ICD-10-CM | POA: Diagnosis not present

## 2020-11-23 NOTE — Progress Notes (Signed)
Virtual Visit via Video Note The purpose of this virtual visit is to provide medical care while limiting exposure to the novel coronavirus.    Consent was obtained for video visit:  Yes Answered questions that patient had about telehealth interaction:  Yes I discussed the limitations, risks, security and privacy concerns of performing an evaluation and management service by telemedicine. I also discussed with the patient that there may be a patient responsible charge related to this service. The patient expressed understanding and agreed to proceed.  Pt location: Home Physician Location: office Name of referring provider:  Mosie Lukes, MD I connected with Iantha Fallen at patients initiation/request on 11/24/2020 at 10:10 AM EST by video enabled telemedicine application and verified that I am speaking with the correct person using two identifiers. Pt MRN:  [id number] Pt DOB:  10-May-1946 Video Participants:  Iantha Fallen  Assessment and Plan:   1.  Chronic daily headache, resolved 2.  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  OSA  Doing well off of medication Follow up one year   History of Present Illness:  Anne Barnes is a62year old right-handed woman with history of OSA, chronic sinusitis, and hypertension who follows up for headache and dizziness.  UPDATE: Started on topiramate in April, however headaches stopped after 2 weeks and then she discontinued topiramate.  She may have a mild headache once in awhile but nothing severe.   She followed up with pulmonology.  No change in management for OSA.  Has follow up next month.     HISTORY: She has had headaches for several years, which she attributes to chronic sinusitis. I saw her back in late 2014. At that time, she  described as 10/10 bifrontal and left temporal pounding headache lasting several seconds and triggered by coughing or sneezing but not usually by bending  over. She had an underlying constant aching thatoccurred almost daily. She also had left sided neck pain radiating up to behind her left ear but not into the arm. No associated visual disturbance, nausea, vomiting, photophobia, phonophobia, or dizziness. 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 She deferred evaluation by neurosurgery. 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 She was on indomethacin for a time with variable efficacy. She was last seen in 2015. For ongoing headache and neck pain, she underwentCT head and cervicalspine on 02/19/2017 which was personally reviewed andagaindemonstratedChiari with tonsillar ectopia 13 mm below foramen magnum and mild degenerative changes at C2-3 and C3-4 on the left and C4-5 and C5-T2. However, she also demonstrated ventriculomegaly as well. Symptoms particularly got worse since her husband passed away in 24-Aug-2019. In January 2020, she started experiencingsevere right sided headaches as well. They are infrequent, maybe occurring once a month. She continues to have a dull daily headache. She wakes up in the morning with it and it resolves by the afternoon. Neck pain is not too much of an issue. Sometimes it feels still which she attributes to sleeping in a recliner. No associated visual disturbance, nausea, vomiting, numbness or weakness. She reports increased dizziness and anxiety. Dizziness is described as a lightheadedness, rarely with spinning, and occurs only when she is standing and moving about. It does not occur while sitting or at rest. Sometimes she feels like she may pass out but that has  not happened. She also reports worsening hearing and tinnitus in her right ear. No double vision or trouble swallowing. She was evaluated by ENT. Audiometric  testing revealed bilateral sensorineural hearing loss. Hearing aids were recommended. CT of paranasal sinuses were clear. She was advised to see cardiology for evaluation of heart murmur. 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 14 day Zio patch demonstrated sinus bradycardia to sinus tachycardia (39 bpm to 167 bpm with average of 57 bpm) and very short episodes of SVT but no pauses or atrial fibrillation. She is treated for OSA with CPAP. Pulmonology treated her for bronchitis. She was seen in the ED on 11/24/2019 for headache. CT head personally reviewed showed stable age-related atrophy and ventriculomegaly but without apparent crowding of the structures in the posterior fossa andno acute abnormality. She has continued to experience rhinorrhea, cough and nasal congestion related to allergies. MRI of brain without contrast on 01/27/2020 confirmed Chiari 1 malformation but no crowding or abnormal morphology of brain structures within the posterior fossa.Marland Kitchen  She was started on topiramate in April 2021 but headaches resolved and she discontinued it after a couple of weeks.  Past medications:  Fioricet, topiramate  Past Medical History: Past Medical History:  Diagnosis Date  . Acute combined systolic and diastolic heart failure ([place]) 05/26/2015  . ALLERGIC RHINITIS   . Allergic urticaria 08/01/2014   Patient reports allergies to shrimp, egg whites, tomatoes, dairy   . Allergy    seasonal and numerous food and drug allergies.  . 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   Office Spirometry 11/07/17-WNL-FVC 1.58/82%, FEV1 1.27/85%, ratio 0.80, FEF 25-75% 0.26/89%  . Bradycardia 02/25/2017  . Chiari malformation    Noted MRI brain 09/2013 - s/p neuro eval for same  . DIVERTICULOSIS, COLON   . Essential hypertension 08/28/2010   Qualifier: Diagnosis of  By: Marca Ancona RMA, Lucy     . Frequent headaches   . GERD   . H/O measles   . H/O mumps   . Hearing loss 01/18/2017  . History of chicken pox   .  Hyperglycemia 03/05/2016  . HYPERTENSION   . Hypocalcemia 02/25/2017  . IBS (irritable bowel syndrome) 05/26/2015  . Nonspecific abnormal electrocardiogram (ECG) (EKG)   . OBSTRUCTIVE SLEEP APNEA 12/2008 dx   noncompliant with CPAP qhs  . Obstructive sleep apnea 08/28/2010   NPSG 12/2008:  AHI 13/hr with desats to 78%   . Preventative health care 06/11/2017  . Sciatica    right side  . Seasonal and perennial allergic rhinitis 08/28/2010   Allergy profile 03/11/14- positive especially for dust, cat and dog Food allergy profile- total IgE 86 with several food group elevations Sed rate-WNL     . Sinusitis 06/06/2017  . Sleep apnea    uses cpap  . 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 Vaginitis 01/18/2017  . Vitamin D deficiency 06/11/2017    Medications: Outpatient Encounter Medications as of 11/24/2020  Medication Sig  . albuterol (VENTOLIN HFA) 108 (90 Base) MCG/ACT inhaler INHALE 2 PUFFS INTO THE LUNGS EVERY 6 HOURS AS NEEDED FOR WHEEZING FOR SHORTNESS OF BREATH  . ALPRAZolam (XANAX) 0.5 MG tablet TAKE 1 TABLET BY MOUTH TWICE DAILY AS NEEDED FOR ANXIETY AND FOR SLEEP  . amLODipine (NORVASC) 2.5 MG tablet Take 2.5 mg by mouth 2 (two) times daily.  Marland Kitchen azelastine (ASTELIN) 0.1 % nasal spray Place 2 sprays into both nostrils 2 (two) times daily.  Marland Kitchen b complex vitamins tablet Take 1 tablet by mouth daily.  Marland Kitchen  Cholecalciferol (VITAMIN D) 2000 UNITS CAPS Take by mouth daily.  Marland Kitchen EPINEPHrine 0.3 mg/0.3 mL IJ SOAJ injection Inject 0.3 mg into the muscle as needed for anaphylaxis.  . fluticasone (FLONASE) 50 MCG/ACT nasal spray Place 2 sprays into both nostrils daily.  Marland Kitchen levocetirizine (XYZAL) 5 MG tablet TAKE 1 TABLET BY MOUTH ONCE DAILY IN THE EVENING  . losartan (COZAAR) 100 MG tablet Take 1 tablet (100 mg total) by mouth daily.  . montelukast (SINGULAIR) 10 MG tablet TAKE 1 TABLET BY MOUTH AT BEDTIME  . Multiple Vitamin (MULTIVITAMIN) tablet Take 1 tablet by mouth daily.  Marland Kitchen olopatadine (PATANOL) 0.1 %  ophthalmic solution Place 1 drop into both eyes 2 (two) times daily.  Marland Kitchen omeprazole (PRILOSEC) 20 MG capsule Take 1 capsule by mouth once daily  . rosuvastatin (CRESTOR) 10 MG tablet Take 1 tablet by mouth once daily  . spironolactone (ALDACTONE) 25 MG tablet Take 1 tablet (25 mg total) by mouth daily.  Marland Kitchen VITAMIN D, CHOLECALCIFEROL, PO Take by mouth.   No facility-administered encounter medications on file as of 11/24/2020.    Allergies: Allergies  Allergen Reactions  . Codeine Nausea And Vomiting  . Eggs Or Egg-Derived Products   . Hydralazine Hcl Other (See Comments)    Pt reports causes dizziness  . Lisinopril-Hydrochlorothiazide Hives    REACTION: lip swelling,facial swelling,rash.  . Metoprolol Other (See Comments)    Does not feel well on it   . Other Cough    Patient states she is allergic to Shrimp, egg whites, tomatoes and dairy products.  Says they make her feel unwell but do not cause hives, SOB or anaphylaxis.  She just avoids eating them because they can make her have a headache.  Strawberries, watermelon, peanuts, and all shellfish.    . Shrimp [Shellfish Allergy] Other (See Comments)    Was allergy tested and showed shrimp was one--has not reacted b/c avoided  . Tomato Other (See Comments)    Family History: Family History  Problem Relation Age of Onset  . Arthritis Mother        died of complications from hip surgery  . Arthritis Father   . Kidney disease Father   . Pneumonia Father   . Heart disease Father   . Lupus Daughter   . Rheum arthritis Daughter   . Sjogren's syndrome Daughter   . Fibromyalgia Daughter   . Diabetes Brother   . Heart disease Brother   . Heart attack Brother   . Alcohol abuse Brother   . Throat cancer Brother   . Lung cancer Brother   . Kidney disease Sister        dialysis  . Obesity Sister   . Hypertension Maternal Grandfather   . Hypertension Sister   . Kidney disease Sister        dialysis  . Pneumonia Brother   .  Hypertension Daughter   . Graves' disease Daughter   . Hypertension Daughter   . Alcohol abuse Other        parent  . Ataxia Neg Hx   . Chorea Neg Hx   . Dementia Neg Hx   . Mental retardation Neg Hx   . Migraines Neg Hx   . Multiple sclerosis Neg Hx   . Neurofibromatosis Neg Hx   . Neuropathy Neg Hx   . Parkinsonism Neg Hx   . Seizures Neg Hx   . Stroke Neg Hx   . Colon cancer Neg Hx   . Colon polyps  Neg Hx   . Esophageal cancer Neg Hx   . Rectal cancer Neg Hx   . Stomach cancer Neg Hx     Observations/Objective:   There were no vitals taken for this visit. No acute distress.  Alert and oriented.  Speech fluent and not dysarthric.  Language intact.     Follow Up Instructions:    -I discussed the assessment and treatment plan with the patient. The patient was provided an opportunity to ask questions and all were answered. The patient agreed with the plan and demonstrated an understanding of the instructions.   The patient was advised to call back or seek an in-person evaluation if the symptoms worsen or if the condition fails to improve as anticipated.   Dudley Major, DO

## 2020-11-24 ENCOUNTER — Encounter: Payer: Self-pay | Admitting: Neurology

## 2020-11-24 ENCOUNTER — Other Ambulatory Visit: Payer: Self-pay

## 2020-11-24 ENCOUNTER — Telehealth (INDEPENDENT_AMBULATORY_CARE_PROVIDER_SITE_OTHER): Payer: Medicare HMO | Admitting: Neurology

## 2020-11-24 DIAGNOSIS — G935 Compression of brain: Secondary | ICD-10-CM

## 2020-11-24 DIAGNOSIS — R519 Headache, unspecified: Secondary | ICD-10-CM | POA: Diagnosis not present

## 2020-11-24 DIAGNOSIS — G4733 Obstructive sleep apnea (adult) (pediatric): Secondary | ICD-10-CM

## 2020-11-25 ENCOUNTER — Other Ambulatory Visit: Payer: Self-pay | Admitting: Occupational Therapy

## 2020-11-25 ENCOUNTER — Other Ambulatory Visit: Payer: Self-pay

## 2020-11-25 NOTE — Patient Instructions (Signed)
Be independent and not struggle as much with donning socks (a sock aid will help with this)  ACTION PLANNING - DRESSING/BATHING Target Problem Area: Difficulty with lower body dressing (socks)  Why Problem May Occur: Decreased flexibility  Target Goal: Independence with putting on socks  STRATEGIES Saving Your Energy: DO: DON'T:  Sit down to do tasks  Stand too long while dressing  Use appropriate adaptive equipment:  sock aid   Keep all items you'll need within easy reach    Simplifying the way you set up tasks or daily routines: DO: DON'T:  Wear sturdy shoes that grip the floor   Gather all items before getting started   Wear clothing that is easily manipulated Wear clothes you can get tripped up on.   Practice It is important to practice the strategies so we can determine if they will be effective in helping to reach your goal. Follow these specific recommendations: 1.Use the sock aid so you don't have to struggle to get socks on. May be able to use sock aid to get compression socks started OR can use bag methods shown to you on web.  If a strategy does not work the first time, try it again and again (and maybe again). We may make some changes over the next few sessions, based on how they work.  Golden Circle, OTR/L        11/25/2020

## 2020-11-25 NOTE — Patient Outreach (Signed)
Aging Gracefully Program  OT FINAL Visit  11/25/2020  Anne Barnes 07-Sep-1946 [id number]  Visit:  5- Fifth Visit  Start Time:  9381 End Time:  0175 Total Minutes:  45    Readiness to Change:  Readiness to Change Score: 10   Durable Medical Equipment: Adaptive Equipment: Sock Aid Adaptive Equipment Distribution Date: 11/25/20  Patient Education: Education Provided:  (Will mail her the "Tips for Aging at [facility]")  Goals: Goals Addressed            This Visit's Progress   . COMPLETED: Patient Stated       Be independent and not struggle as much with donning socks (a sock aid will help with this)  ACTION PLANNING - DRESSING/BATHING Target Problem Area: Difficulty with lower body dressing (socks)  Why Problem May Occur: Decreased flexibility  Target Goal: Independence with putting on socks  STRATEGIES Saving Your Energy: DO: DON'T:  Sit down to do tasks  Stand too long while dressing  Use appropriate adaptive equipment:  sock aid   Keep all items you'll need within easy reach    Simplifying the way you set up tasks or daily routines: DO: DON'T:  Wear sturdy shoes that grip the floor   Gather all items before getting started   Wear clothing that is easily manipulated Wear clothes you can get tripped up on.   Practice It is important to practice the strategies so we can determine if they will be effective in helping to reach your goal. Follow these specific recommendations: 1.Use the sock aid so you don't have to struggle to get socks on. May be able to use sock aid to get compression socks started OR can use bag methods shown to you on web.  If a strategy does not work the first time, try it again and again (and maybe again). We may make some changes over the next few sessions, based on how they work.  Golden Circle, OTR/L        11/25/2020        Post Clinical Reasoning: Client Action (Goal) One Interventions: Client Provided with Sock aid Did  Client Try?: Yes Targeted Problem Area Status: A Lot Better Clinician View Of Client Situation:: Anne Barnes said she was not having the best week since she had had an increased dose of her allergy shot meds in prep for starting her maintainence allergy shots; but she was managing. Provided her with a sock aid, showed her how to do it with one of her socks and she then did the other sock totally on her own with the sock aid. She now will not have to have other family members to sometimes help her with her socks. She reported she might start wearing compression socks since her right foot is swollen compared to her left foot (she reports this has always been this way). I told her that usually the sock aid may help her get a compression sock started but not all the way on. I also showed her a couple of videos on web of how to use a plastic grocery bag to help get compression socks on. Client View Of His/Her Situation:: She is very appreciative for all that [facility], OT, and RN have done for her through this program that have made her life easier. Next Visit Plan:: This was the last visit

## 2020-11-28 ENCOUNTER — Ambulatory Visit: Payer: Medicare HMO | Admitting: Cardiology

## 2020-11-28 ENCOUNTER — Ambulatory Visit (INDEPENDENT_AMBULATORY_CARE_PROVIDER_SITE_OTHER): Payer: Medicare HMO | Admitting: *Deleted

## 2020-11-28 ENCOUNTER — Other Ambulatory Visit: Payer: Self-pay

## 2020-11-28 DIAGNOSIS — J309 Allergic rhinitis, unspecified: Secondary | ICD-10-CM

## 2020-11-28 NOTE — Patient Outreach (Signed)
Aging Gracefully Program  11/28/2020  DYANN GOODSPEED [birth year] [id number]  [facility] Evaluation Interviewer attempted to call patient on today regarding scheduling 5 month Aging Gracefully. No answer from patient after multiple rings. CMA left confidential voicemail for patient to return call.  Will attempt to call back within 1 week.   [place] Management Assistant [phone]

## 2020-11-28 NOTE — Progress Notes (Signed)
Cardiology Office Note:    Date:  12/01/2020   ID:  SILVINA HACKLEMAN, DOB 1946/04/04, MRN [id number]  PCP:  Mosie Lukes, MD   [facility]  Cardiologist:  Ena Dawley, MD  Advanced Practice Provider:  No care team member to display Electrophysiologist:  None   Referring MD: Mosie Lukes, MD    History of Present Illness:    Anne Barnes is a 75 y.o. 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 Meda Coffee who now returns to clinic for follow-up.  She was seen in January 2021 for concern of chest pains, a coronary CTA was performed that showed mild nonobstructive CAD and also findings of peribronchovascular nodules in the medial aspect of the right lower lobe with ill-defined margins, likely infectious or inflammatory in etiology.  Repeat CT scan on Jan 26, 2020 showed that those resolved and it confirmed inflammatory etiology.  She was started on Crestor 10 mg daily that she tolerates well.  Last saw Dr. Meda Coffee on 08/04/20 where she was mourning the loss of her husband of 73 years. She had mild LE edema but no orthopnea. Her spironolactone was increased to 25mg  daily.  Today, the patient states she continues to having LE edema. Has significant seasonal allergies and is undergoing injection therapy. Admits that she has not been compliant with diet and she has gained 12lbs since last visit. She is trying to increase her walking at least for an hour 3-4x/week. She has took to breaks to rest but then resumes and activities. She feels well while doing this. Blood pressure well controlled at home. She is motivated to lose weight and change her diet. She also states she wants to try compression socks and leg elevation prior to changing her diuretic.  Past Medical History:  Diagnosis Date  . Acute combined systolic and diastolic heart failure ([place]) 05/26/2015  .  ALLERGIC RHINITIS   . Allergic urticaria 08/01/2014   Patient reports allergies to shrimp, egg whites, tomatoes, dairy   . Allergy    seasonal and numerous food and drug allergies.  . 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   Office Spirometry 11/07/17-WNL-FVC 1.58/82%, FEV1 1.27/85%, ratio 0.80, FEF 25-75% 0.26/89%  . Bradycardia 02/25/2017  . Chiari malformation    Noted MRI brain 09/2013 - s/p neuro eval for same  . DIVERTICULOSIS, COLON   . Essential hypertension 08/28/2010   Qualifier: Diagnosis of  By: Marca Ancona RMA, Lucy     . Frequent headaches   . GERD   . H/O measles   . H/O mumps   . Hearing loss 01/18/2017  . History of chicken pox   . Hyperglycemia 03/05/2016  . HYPERTENSION   . Hypocalcemia 02/25/2017  . IBS (irritable bowel syndrome) 05/26/2015  . Nonspecific abnormal electrocardiogram (ECG) (EKG)   . OBSTRUCTIVE SLEEP APNEA 12/2008 dx   noncompliant with CPAP qhs  . Obstructive sleep apnea 08/28/2010   NPSG 12/2008:  AHI 13/hr with desats to 78%   . Preventative health care 06/11/2017  . Sciatica    right side  . Seasonal and perennial allergic rhinitis 08/28/2010   Allergy profile 03/11/14- positive especially for dust, cat and dog Food allergy profile- total IgE 86 with several food group elevations Sed rate-WNL     . Sinusitis 06/06/2017  . Sleep apnea    uses cpap  . 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  Vaginitis 01/18/2017  . Vitamin D deficiency 06/11/2017    Past Surgical History:  Procedure Laterality Date  . ABDOMINAL HYSTERECTOMY  1986  . BREAST SURGERY  1973   Breast biopsy  . COLONOSCOPY  2011   Dr Collene Mares  . UMBILICAL HERNIA REPAIR      Current Medications: Current Meds  Medication Sig  . albuterol (VENTOLIN HFA) 108 (90 Base) MCG/ACT inhaler INHALE 2 PUFFS INTO THE LUNGS EVERY 6 HOURS AS NEEDED FOR WHEEZING FOR SHORTNESS OF BREATH  . ALPRAZolam (XANAX) 0.5 MG tablet TAKE 1 TABLET BY MOUTH TWICE DAILY AS NEEDED FOR ANXIETY AND FOR SLEEP  . amLODipine (NORVASC) 2.5 MG  tablet Take 2.5 mg by mouth 2 (two) times daily.  Marland Kitchen azelastine (ASTELIN) 0.1 % nasal spray Place 2 sprays into both nostrils 2 (two) times daily.  Marland Kitchen b complex vitamins tablet Take 1 tablet by mouth daily.  . Cholecalciferol (VITAMIN D) 2000 UNITS CAPS Take by mouth daily.  Marland Kitchen EPINEPHrine 0.3 mg/0.3 mL IJ SOAJ injection Inject 0.3 mg into the muscle as needed for anaphylaxis.  . fluticasone (FLONASE) 50 MCG/ACT nasal spray Place 2 sprays into both nostrils daily.  Marland Kitchen levocetirizine (XYZAL) 5 MG tablet TAKE 1 TABLET BY MOUTH ONCE DAILY IN THE EVENING  . losartan (COZAAR) 100 MG tablet Take 1 tablet (100 mg total) by mouth daily.  Marland Kitchen MODERNA COVID-19 VACCINE 100 MCG/0.5ML injection   . montelukast (SINGULAIR) 10 MG tablet TAKE 1 TABLET BY MOUTH AT BEDTIME  . Multiple Vitamin (MULTIVITAMIN) tablet Take 1 tablet by mouth daily.  Marland Kitchen olopatadine (PATANOL) 0.1 % ophthalmic solution Place 1 drop into both eyes 2 (two) times daily.  Marland Kitchen omeprazole (PRILOSEC) 20 MG capsule Take 1 capsule by mouth once daily  . rosuvastatin (CRESTOR) 10 MG tablet Take 1 tablet by mouth once daily  . spironolactone (ALDACTONE) 25 MG tablet Take 1 tablet (25 mg total) by mouth daily.  Marland Kitchen VITAMIN D, CHOLECALCIFEROL, PO Take by mouth.     Allergies:   Codeine, Eggs or egg-derived products, Hydralazine hcl, Lisinopril-hydrochlorothiazide, Metoprolol, Other, Shrimp [shellfish allergy], and Tomato   Social History   Socioeconomic History  . Marital status: Widowed    Spouse name: Not on file  . Number of children: Not on file  . Years of education: Not on file  . Highest education level: Not on file  Occupational History  . Occupation: retired  Tobacco Use  . Smoking status: Never Smoker  . Smokeless tobacco: Never Used  . Tobacco comment: Married, lives with spouse, dtr 2 g-kids.  Vaping Use  . Vaping Use: Never used  Substance and Sexual Activity  . Alcohol use: No    Comment: rare  . Drug use: No  . Sexual activity:  Not Currently    Comment: lives with husband, no dietary restrictions avoid foods allergic to, retired  Other Topics Concern  . Not on file  Social History Narrative   Right handed   One story with daughter and two grandkids   Social Determinants of Health   Financial Resource Strain: Low Risk   . Difficulty of Paying Living Expenses: Not hard at all  Food Insecurity: No Food Insecurity  . Worried About Charity fundraiser in the Last Year: Never true  . Ran Out of Food in the Last Year: Never true  Transportation Needs: No Transportation Needs  . Lack of Transportation (Medical): No  . Lack of Transportation (Non-Medical): No  Physical Activity: Not on file  Stress: Not on file  Social Connections: Not on file     Family History: The patient's family history includes Alcohol abuse in her brother and another family member; Arthritis in her father and mother; Diabetes in her brother; Fibromyalgia in her daughter; Berenice Primas' disease in her daughter; Heart attack in her brother; Heart disease in her brother and father; Hypertension in her daughter, daughter, maternal grandfather, and sister; Kidney disease in her father, sister, and sister; Lung cancer in her brother; Lupus in her daughter; Obesity in her sister; Pneumonia in her brother and father; Rheum arthritis in her daughter; Sjogren's syndrome in her daughter; Throat cancer in her brother. There is no history of Ataxia, Chorea, Dementia, Mental retardation, Migraines, Multiple sclerosis, Neurofibromatosis, Neuropathy, Parkinsonism, Seizures, Stroke, Colon cancer, Colon polyps, Esophageal cancer, Rectal cancer, or Stomach cancer.  ROS:   Please see the history of present illness.    Review of Systems  Constitutional: Negative for chills, fever and malaise/fatigue.  HENT: Negative for sore throat.   Eyes: Negative for blurred vision and redness.  Respiratory: Positive for shortness of breath.   Cardiovascular: Positive for leg  swelling. Negative for chest pain, palpitations, orthopnea, claudication and PND.  Gastrointestinal: Negative for melena, nausea and vomiting.  Genitourinary: Negative for dysuria and flank pain.  Musculoskeletal: Positive for joint pain.  Neurological: Negative for dizziness and loss of consciousness.  Endo/Heme/Allergies: Negative for polydipsia.  Psychiatric/Behavioral: Negative for substance abuse.    EKGs/Labs/Other Studies Reviewed:    The following studies were reviewed today: TTE 25-Dec-2016: -------------------------------------------------------------------  Left ventricle: The cavity size was normal. Systolic function was  normal. The estimated ejection fraction was in the range of 60% to  65%. Wall motion was normal; there were no regional wall motion  abnormalities. There was no evidence of elevated ventricular  filling pressure by Doppler parameters.   -------------------------------------------------------------------  Aortic valve:  Trileaflet; normal thickness leaflets. Mobility was  not restricted. Doppler: Transvalvular velocity was within the  normal range. There was no stenosis. There was no regurgitation.    -------------------------------------------------------------------  Aorta: Aortic root: The aortic root was normal in size.   -------------------------------------------------------------------  Mitral valve:  Structurally normal valve.  Mobility was not  restricted. Doppler: Transvalvular velocity was within the normal  range. There was no evidence for stenosis. There was no  regurgitation.  Peak gradient (D): 4 mm Hg.   -------------------------------------------------------------------  Left atrium: The atrium was normal in size.   -------------------------------------------------------------------  Right ventricle: Poorly visualized. The cavity size was normal.  Wall thickness was normal. Systolic function was normal.    -------------------------------------------------------------------  Pulmonic valve:  Structurally normal valve.  Cusp separation was  normal. Doppler: Transvalvular velocity was within the normal  range. There was no evidence for stenosis. There was mild  regurgitation.   -------------------------------------------------------------------  Tricuspid valve:  Structurally normal valve.  Doppler:  Transvalvular velocity was within the normal range. There was no  regurgitation.   -------------------------------------------------------------------  Pulmonary artery:  The main pulmonary artery was normal-sized.  Systolic pressure was within the normal range.   -------------------------------------------------------------------  Right atrium: The atrium was normal in size.   -------------------------------------------------------------------  Pericardium: There was no pericardial effusion.   -------------------------------------------------------------------  Systemic veins:  Inferior vena cava: The vessel was normal in size.   Myoview 25-Dec-2016:  The left ventricular ejection fraction is hyperdynamic (>65%).  Nuclear stress EF: 66%.  No T wave inversion was noted during stress.  There was no ST segment deviation noted during stress.  Defect 1: There is a small defect of mild severity.   Small size, mild intensity fixed apical lateral and inferolateral perfusion defect, likely attenuation artifact. No significant reversible ischemia. LVEF 66% with normal wall motion. This is a low risk study.  Cardiac monitor 12/2019:  Sinus bradycardia to sinus tachycardia. Patient had a min HR of 39 bpm, max HR of 167 bpm, and avg HR of 57 bpm.  Very short episodes of SVT, no pauses no atrial fibrillation.   Short episodes of SVT, no other significant arrhythmias.   Coronary CTA 11/20/19: FINDINGS: A 100 kV prospective scan was triggered in the descending thoracic aorta at 111  HU's. Axial non-contrast 3 mm slices were carried out through the heart. The data set was analyzed on a dedicated work station and scored using the [place]. Gantry rotation speed was 250 msecs and collimation was .6 mm. No beta blockade and 0.8 mg of sl NTG was given. The 3D data set was reconstructed in 5% intervals of the 67-82 % of the R-R cycle. Diastolic phases were analyzed on a dedicated work station using MPR, MIP and VRT modes. The patient received 80 cc of contrast.  Aorta:  Normal size.  Mild diffuse calcifications.  No dissection.  Aortic Valve:  Trileaflet.  No calcifications.  Coronary Arteries:  Normal coronary origin.  Right dominance.  RCA is a large dominant artery that gives rise to PDA and PLA. There is significant motion, however there is only mild mixed plaque in the proximal and mid RCA with 25-49% plaque.  Left main is a large artery that gives rise to LAD and LCX arteries. Left main has no plaque.  LAD is a large vessel that has mild calcified plaque in the proximal and mid LAD with stenosis 0-25%. Distal LAD has no significant plaque.  D1 is a very small branch with no plaque.  LCX is a non-dominant artery that gives rise to two OM branches, there is minimal plaque.  Other findings:  Normal pulmonary vein drainage into the left atrium.  Normal left atrial appendage without a thrombus.  Normal size of the pulmonary artery.  IMPRESSION: 1. Coronary calcium score of 127. This was 64 percentile for age and sex matched control.  2. Normal coronary origin with right dominance.  3. CAD-RADS 2. Mild non-obstructive CAD (25-49%). Consider non-atherosclerotic causes of chest pain. Consider preventive therapy and risk factor modification.   Recent Labs: 02/17/2020: NT-Pro BNP 31 10/20/2020: ALT 27; BUN 20; Creatinine, Ser 0.89; Hemoglobin 12.9; Platelets 245.0; Potassium 4.1; Sodium 141; TSH 1.11  Recent Lipid Panel     Component Value Date/Time   CHOL 128 10/20/2020 1150   CHOL 116 02/17/2020 0904   TRIG 45.0 10/20/2020 1150   TRIG 55 12/30/2009 0000   HDL 48.40 10/20/2020 1150   HDL 42 02/17/2020 0904   CHOLHDL 3 10/20/2020 1150   VLDL 9.0 10/20/2020 1150   LDLCALC 71 10/20/2020 1150   LDLCALC 64 02/17/2020 0904     Physical Exam:    VS:  BP (!) 142/80   Pulse 87   Ht 4\' 11"  (1.499 m)   Wt 231 lb 6.4 oz (105 kg)   SpO2 98%   BMI 46.74 kg/m     Wt Readings from Last 3 Encounters:  12/01/20 231 lb 6.4 oz (105 kg)  11/16/20 232 lb 3.2 oz (105.3 kg)  11/14/20 230 lb (104.3 kg)     GEN:  Well nourished, well developed in no acute distress HEENT:  Normal NECK: No JVD; No carotid bruits CARDIAC: RRR, no murmurs, rubs, gallops RESPIRATORY:  Clear to auscultation without rales, wheezing or rhonchi  ABDOMEN: Soft, non-tender, non-distended MUSCULOSKELETAL:  R>L 1+ pedal edema (right is always bigger than left) SKIN: Warm and dry NEUROLOGIC:  Alert and oriented x 3 PSYCHIATRIC:  Normal affect   ASSESSMENT:    1. Coronary artery disease involving native coronary artery of native heart without angina pectoris   2. Chronic diastolic heart failure ([place])   3. Essential hypertension   4. Hyperlipidemia, unspecified hyperlipidemia type    PLAN:    In order of problems listed above:  #Nonobstructive CAD: Coronary CTA showed coronary calcium of 127 that was 71 percentile February 2021.  There was mild nonobstructive CAD. No anginal symptoms. -Continue crestor 10mg  daily -Continue ASA 81mg  daily -Continue lifestyle modifications  #[place] Diastolic HF: Has mild LE edema today but no orthopnea or PND. Has been trying to increase her exercise and change her diet to help with weight loss.  -Continue spiro 25mg  daily -Continue losartan 100mg  daily -Low Na diet -Compression socks and leg elevation--if fails, can do trial of lasix  #HTN: Well controlled at home per patient  report. -Continue amlodipine 2.5mg  BID -Continue losartan 100mg  daily -Continue spironolactone 25mg  daily  #HLD: -Crestor 10mg  as above -Continue lifestyle modifications as detailed below  Exercise recommendations: Goal of exercising for at least 30 minutes a day, at least 5 times per week.  Please exercise to a moderate exertion.  This means that while exercising it is difficult to speak in full sentences, however you are not so short of breath that you feel you must stop, and not so comfortable that you can carry on a full conversation.  Exertion level should be approximately a 5/10, if 10 is the most exertion you can perform.  Diet recommendations: Recommend a heart healthy diet such as the Mediterranean diet.  This diet consists of plant based foods, healthy fats, lean meats, olive oil.  It suggests limiting the intake of simple carbohydrates such as white breads, pastries, and pastas.  It also limits the amount of red meat, wine, and dairy products such as cheese that one should consume on a daily basis.     Medication Adjustments/Labs and Tests Ordered: Current medicines are reviewed at length with the patient today.  Concerns regarding medicines are outlined above.  No orders of the defined types were placed in this encounter.  No orders of the defined types were placed in this encounter.   Patient Instructions   Medication Instructions:  Your physician recommends that you continue on your current medications as directed. Please refer to the Current Medication list given to you today. *If you need a refill on your cardiac medications before your next appointment, please call your pharmacy*   Lab Work: none If you have labs (blood work) drawn today and your tests are completely normal, you will receive your results only by: Marland Kitchen MyChart Message (if you have MyChart) OR . A paper copy in the mail If you have any lab test that is abnormal or we need to change your treatment, we  will call you to review the results.   Mediterranean Diet A Mediterranean diet refers to food and lifestyle choices that are based on the traditions of countries located on the [employer]. This way of eating has been shown to help prevent certain conditions and improve outcomes for people who have chronic diseases, like kidney disease and heart disease. What  are tips for following this plan? Lifestyle  Cook and eat meals together with your family, when possible.  Drink enough fluid to keep your urine clear or pale yellow.  Be physically active every day. This includes: ? Aerobic exercise like running or swimming. ? Leisure activities like gardening, walking, or housework.  Get 7-8 hours of sleep each night.  If recommended by your health care provider, drink red wine in moderation. This means 1 glass a day for nonpregnant women and 2 glasses a day for men. A glass of wine equals 5 oz (150 mL). Reading food labels  Check the serving size of packaged foods. For foods such as rice and pasta, the serving size refers to the amount of cooked product, not dry.  Check the total fat in packaged foods. Avoid foods that have saturated fat or trans fats.  Check the ingredients list for added sugars, such as corn syrup.   Shopping  At the grocery store, buy most of your food from the areas near the walls of the store. This includes: ? Fresh fruits and vegetables (produce). ? Grains, beans, nuts, and seeds. Some of these may be available in unpackaged forms or large amounts (in bulk). ? Fresh seafood. ? Poultry and eggs. ? Low-fat dairy products.  Buy whole ingredients instead of prepackaged foods.  Buy fresh fruits and vegetables in-season from local farmers markets.  Buy frozen fruits and vegetables in resealable bags.  If you do not have access to quality fresh seafood, buy precooked frozen shrimp or canned fish, such as tuna, salmon, or sardines.  Buy small amounts of raw or  cooked vegetables, salads, or olives from the deli or salad bar at your store.  Stock your pantry so you always have certain foods on hand, such as olive oil, canned tuna, canned tomatoes, rice, pasta, and beans. Cooking  Cook foods with extra-virgin olive oil instead of using butter or other vegetable oils.  Have meat as a side dish, and have vegetables or grains as your main dish. This means having meat in small portions or adding small amounts of meat to foods like pasta or stew.  Use beans or vegetables instead of meat in common dishes like chili or lasagna.  Experiment with different cooking methods. Try roasting or broiling vegetables instead of steaming or sauteing them.  Add frozen vegetables to soups, stews, pasta, or rice.  Add nuts or seeds for added healthy fat at each meal. You can add these to yogurt, salads, or vegetable dishes.  Marinate fish or vegetables using olive oil, lemon juice, garlic, and fresh herbs. Meal planning  Plan to eat 1 vegetarian meal one day each week. Try to work up to 2 vegetarian meals, if possible.  Eat seafood 2 or more times a week.  Have healthy snacks readily available, such as: ? Vegetable sticks with hummus. ? [place] yogurt. ? Fruit and nut trail mix.  Eat balanced meals throughout the week. This includes: ? Fruit: 2-3 servings a day ? Vegetables: 4-5 servings a day ? Low-fat dairy: 2 servings a day ? Fish, poultry, or lean meat: 1 serving a day ? Beans and legumes: 2 or more servings a week ? Nuts and seeds: 1-2 servings a day ? Whole grains: 6-8 servings a day ? Extra-virgin olive oil: 3-4 servings a day  Limit red meat and sweets to only a few servings a month   What are my food choices?  Mediterranean diet ? Recommended  Grains: Whole-grain pasta.  Brown rice. Bulgar wheat. Polenta. Couscous. Whole-wheat bread. [place].  Vegetables: Artichokes. Beets. Broccoli. Cabbage. Carrots. Eggplant. Green beans. Chard.  Kale. Spinach. Onions. Leeks. Peas. Squash. Tomatoes. Peppers. Radishes.  Fruits: Apples. Apricots. Avocado. Berries. Bananas. Cherries. Dates. Figs. Grapes. Lemons. Melon. Oranges. Peaches. Plums. Pomegranate.  Meats and other protein foods: Beans. Almonds. Sunflower seeds. Pine nuts. Peanuts. [place]. Salmon. Scallops. Shrimp. [place]. Tilapia. Clams. Oysters. Eggs.  Dairy: Low-fat milk. Cheese. Greek yogurt.  Beverages: Water. Red wine. Herbal tea.  Fats and oils: Extra virgin olive oil. Avocado oil. Grape seed oil.  Sweets and desserts: [place] yogurt with honey. Baked apples. Poached pears. Trail mix.  Seasoning and other foods: Basil. Cilantro. Coriander. Cumin. Mint. Parsley. Sage. Rosemary. Tarragon. Garlic. Oregano. Thyme. Pepper. Balsalmic vinegar. Tahini. Hummus. Tomato sauce. Olives. Mushrooms. ? Limit these  Grains: Prepackaged pasta or rice dishes. Prepackaged cereal with added sugar.  Vegetables: Deep fried potatoes (french fries).  Fruits: Fruit canned in syrup.  Meats and other protein foods: Beef. Pork. Lamb. Poultry with skin. Hot dogs. [place] Salines.  Dairy: Ice cream. Sour cream. Whole milk.  Beverages: Juice. Sugar-sweetened soft drinks. Beer. Liquor and spirits.  Fats and oils: Butter. Canola oil. Vegetable oil. Beef fat (tallow). Lard.  Sweets and desserts: Cookies. Cakes. Pies. Candy.  Seasoning and other foods: Mayonnaise. Premade sauces and marinades. The items listed may not be a complete list. Talk with your dietitian about what dietary choices are right for you. Summary  The Mediterranean diet includes both food and lifestyle choices.  Eat a variety of fresh fruits and vegetables, beans, nuts, seeds, and whole grains.  Limit the amount of red meat and sweets that you eat.  Talk with your health care provider about whether it is safe for you to drink red wine in moderation. This means 1 glass a day for nonpregnant women and 2 glasses a day for men. A glass of  wine equals 5 oz (150 mL). This information is not intended to replace advice given to you by your health care provider. Make sure you discuss any questions you have with your health care provider. Document Revised: 05/10/2016 Document Reviewed: 05/03/2016 Elsevier Patient Education  [place]: At [facility], you and your health needs are our priority.  As part of our continuing mission to provide you with exceptional heart care, we have created designated Provider Care Teams.  These Care Teams include your primary Cardiologist (physician) and Advanced Practice Providers (APPs -  Physician Assistants and Nurse Practitioners) who all work together to provide you with the care you need, when you need it.  We recommend signing up for the patient portal called "MyChart".  Sign up information is provided on this After Visit Summary.  MyChart is used to connect with patients for Virtual Visits (Telemedicine).  Patients are able to view lab/test results, encounter notes, upcoming appointments, etc.  Non-urgent messages can be sent to your provider as well.   To learn more about what you can do with MyChart, go to [email or web address].    Your next appointment:   3 month(s)  The format for your next appointment:   In Person  Provider:   You may see Dr. Gwyndolyn Kaufman or one of the following Advanced Practice Providers on your designated Care Team:    Richardson Dopp, PA-C  Robbie Lis, [place]         Signed, Freada Bergeron, MD  12/01/2020 3:48 PM    [facility]

## 2020-12-01 ENCOUNTER — Ambulatory Visit: Payer: Medicare HMO | Admitting: Cardiology

## 2020-12-01 ENCOUNTER — Encounter: Payer: Self-pay | Admitting: Cardiology

## 2020-12-01 ENCOUNTER — Other Ambulatory Visit: Payer: Self-pay

## 2020-12-01 VITALS — BP 142/80 | HR 87 | Ht 59.0 in | Wt 231.4 lb

## 2020-12-01 DIAGNOSIS — I10 Essential (primary) hypertension: Secondary | ICD-10-CM

## 2020-12-01 DIAGNOSIS — I2510 Atherosclerotic heart disease of native coronary artery without angina pectoris: Secondary | ICD-10-CM

## 2020-12-01 DIAGNOSIS — I5032 Chronic diastolic (congestive) heart failure: Secondary | ICD-10-CM | POA: Diagnosis not present

## 2020-12-01 DIAGNOSIS — E785 Hyperlipidemia, unspecified: Secondary | ICD-10-CM

## 2020-12-01 NOTE — Patient Instructions (Signed)
Medication Instructions:  Your physician recommends that you continue on your current medications as directed. Please refer to the Current Medication list given to you today. *If you need a refill on your cardiac medications before your next appointment, please call your pharmacy*   Lab Work: none If you have labs (blood work) drawn today and your tests are completely normal, you will receive your results only by: Marland Kitchen MyChart Message (if you have MyChart) OR . A paper copy in the mail If you have any lab test that is abnormal or we need to change your treatment, we will call you to review the results.   Mediterranean Diet A Mediterranean diet refers to food and lifestyle choices that are based on the traditions of countries located on the [employer]. This way of eating has been shown to help prevent certain conditions and improve outcomes for people who have chronic diseases, like kidney disease and heart disease. What are tips for following this plan? Lifestyle  Cook and eat meals together with your family, when possible.  Drink enough fluid to keep your urine clear or pale yellow.  Be physically active every day. This includes: ? Aerobic exercise like running or swimming. ? Leisure activities like gardening, walking, or housework.  Get 7-8 hours of sleep each night.  If recommended by your health care provider, drink red wine in moderation. This means 1 glass a day for nonpregnant women and 2 glasses a day for men. A glass of wine equals 5 oz (150 mL). Reading food labels  Check the serving size of packaged foods. For foods such as rice and pasta, the serving size refers to the amount of cooked product, not dry.  Check the total fat in packaged foods. Avoid foods that have saturated fat or trans fats.  Check the ingredients list for added sugars, such as corn syrup.   Shopping  At the grocery store, buy most of your food from the areas near the walls of the store. This  includes: ? Fresh fruits and vegetables (produce). ? Grains, beans, nuts, and seeds. Some of these may be available in unpackaged forms or large amounts (in bulk). ? Fresh seafood. ? Poultry and eggs. ? Low-fat dairy products.  Buy whole ingredients instead of prepackaged foods.  Buy fresh fruits and vegetables in-season from local farmers markets.  Buy frozen fruits and vegetables in resealable bags.  If you do not have access to quality fresh seafood, buy precooked frozen shrimp or canned fish, such as tuna, salmon, or sardines.  Buy small amounts of raw or cooked vegetables, salads, or olives from the deli or salad bar at your store.  Stock your pantry so you always have certain foods on hand, such as olive oil, canned tuna, canned tomatoes, rice, pasta, and beans. Cooking  Cook foods with extra-virgin olive oil instead of using butter or other vegetable oils.  Have meat as a side dish, and have vegetables or grains as your main dish. This means having meat in small portions or adding small amounts of meat to foods like pasta or stew.  Use beans or vegetables instead of meat in common dishes like chili or lasagna.  Experiment with different cooking methods. Try roasting or broiling vegetables instead of steaming or sauteing them.  Add frozen vegetables to soups, stews, pasta, or rice.  Add nuts or seeds for added healthy fat at each meal. You can add these to yogurt, salads, or vegetable dishes.  Marinate fish or vegetables using  olive oil, lemon juice, garlic, and fresh herbs. Meal planning  Plan to eat 1 vegetarian meal one day each week. Try to work up to 2 vegetarian meals, if possible.  Eat seafood 2 or more times a week.  Have healthy snacks readily available, such as: ? Vegetable sticks with hummus. ? [place] yogurt. ? Fruit and nut trail mix.  Eat balanced meals throughout the week. This includes: ? Fruit: 2-3 servings a day ? Vegetables: 4-5 servings a  day ? Low-fat dairy: 2 servings a day ? Fish, poultry, or lean meat: 1 serving a day ? Beans and legumes: 2 or more servings a week ? Nuts and seeds: 1-2 servings a day ? Whole grains: 6-8 servings a day ? Extra-virgin olive oil: 3-4 servings a day  Limit red meat and sweets to only a few servings a month   What are my food choices?  Mediterranean diet ? Recommended  Grains: Whole-grain pasta. Brown rice. Bulgar wheat. Polenta. Couscous. Whole-wheat bread. [place].  Vegetables: Artichokes. Beets. Broccoli. Cabbage. Carrots. Eggplant. Green beans. Chard. Kale. Spinach. Onions. Leeks. Peas. Squash. Tomatoes. Peppers. Radishes.  Fruits: Apples. Apricots. Avocado. Berries. Bananas. Cherries. Dates. Figs. Grapes. Lemons. Melon. Oranges. Peaches. Plums. Pomegranate.  Meats and other protein foods: Beans. Almonds. Sunflower seeds. Pine nuts. Peanuts. [place]. Salmon. Scallops. Shrimp. [place]. Tilapia. Clams. Oysters. Eggs.  Dairy: Low-fat milk. Cheese. Greek yogurt.  Beverages: Water. Red wine. Herbal tea.  Fats and oils: Extra virgin olive oil. Avocado oil. Grape seed oil.  Sweets and desserts: [place] yogurt with honey. Baked apples. Poached pears. Trail mix.  Seasoning and other foods: Basil. Cilantro. Coriander. Cumin. Mint. Parsley. Sage. Rosemary. Tarragon. Garlic. Oregano. Thyme. Pepper. Balsalmic vinegar. Tahini. Hummus. Tomato sauce. Olives. Mushrooms. ? Limit these  Grains: Prepackaged pasta or rice dishes. Prepackaged cereal with added sugar.  Vegetables: Deep fried potatoes (french fries).  Fruits: Fruit canned in syrup.  Meats and other protein foods: Beef. Pork. Lamb. Poultry with skin. Hot dogs. Berniece Salines.  Dairy: Ice cream. Sour cream. Whole milk.  Beverages: Juice. Sugar-sweetened soft drinks. Beer. Liquor and spirits.  Fats and oils: Butter. Canola oil. Vegetable oil. Beef fat (tallow). Lard.  Sweets and desserts: Cookies. Cakes. Pies. Candy.  Seasoning and  other foods: Mayonnaise. Premade sauces and marinades. The items listed may not be a complete list. Talk with your dietitian about what dietary choices are right for you. Summary  The Mediterranean diet includes both food and lifestyle choices.  Eat a variety of fresh fruits and vegetables, beans, nuts, seeds, and whole grains.  Limit the amount of red meat and sweets that you eat.  Talk with your health care provider about whether it is safe for you to drink red wine in moderation. This means 1 glass a day for nonpregnant women and 2 glasses a day for men. A glass of wine equals 5 oz (150 mL). This information is not intended to replace advice given to you by your health care provider. Make sure you discuss any questions you have with your health care provider. Document Revised: 05/10/2016 Document Reviewed: 05/03/2016 Elsevier Patient Education  [place]: At [facility], you and your health needs are our priority.  As part of our continuing mission to provide you with exceptional heart care, we have created designated Provider Care Teams.  These Care Teams include your primary Cardiologist (physician) and Advanced Practice Providers (APPs -  Physician Assistants and Nurse Practitioners) who all work together to  provide you with the care you need, when you need it.  We recommend signing up for the patient portal called "MyChart".  Sign up information is provided on this After Visit Summary.  MyChart is used to connect with patients for Virtual Visits (Telemedicine).  Patients are able to view lab/test results, encounter notes, upcoming appointments, etc.  Non-urgent messages can be sent to your provider as well.   To learn more about what you can do with MyChart, go to [email or web address].    Your next appointment:   3 month(s)  The format for your next appointment:   In Person  Provider:   You may see Dr. Gwyndolyn Kaufman or one of the following  Advanced Practice Providers on your designated Care Team:    Richardson Dopp, PA-C  [place], [place]

## 2020-12-05 ENCOUNTER — Other Ambulatory Visit: Payer: Self-pay

## 2020-12-05 ENCOUNTER — Ambulatory Visit (INDEPENDENT_AMBULATORY_CARE_PROVIDER_SITE_OTHER): Payer: Medicare HMO

## 2020-12-05 DIAGNOSIS — J309 Allergic rhinitis, unspecified: Secondary | ICD-10-CM | POA: Diagnosis not present

## 2020-12-05 NOTE — Patient Outreach (Signed)
Aging Gracefully Program  12/05/2020  Anne Barnes 1945-11-28 [id number]  [facility] Evaluation Interviewer made contact with patient. Aging Gracefully 5 month survey scheduled for 12/07/20 at 11:30.    [place] Management Assistant [phone]

## 2020-12-06 ENCOUNTER — Other Ambulatory Visit: Payer: Self-pay | Admitting: Family Medicine

## 2020-12-06 ENCOUNTER — Other Ambulatory Visit: Payer: Self-pay | Admitting: Allergy

## 2020-12-06 DIAGNOSIS — J3089 Other allergic rhinitis: Secondary | ICD-10-CM

## 2020-12-07 ENCOUNTER — Other Ambulatory Visit: Payer: Self-pay

## 2020-12-07 NOTE — Patient Outreach (Signed)
Aging Gracefully Program  12/07/2020  Anne Barnes 1946-08-11 [id number]  [facility] Evaluation Interviewer made contact with patient. Aging Gracefully survey 5 month completed.   Interviewer will send follow up to Darylene Price at [employer].  [place] Management Assistant [phone]

## 2020-12-12 ENCOUNTER — Encounter: Payer: Self-pay | Admitting: Gastroenterology

## 2020-12-13 ENCOUNTER — Ambulatory Visit (INDEPENDENT_AMBULATORY_CARE_PROVIDER_SITE_OTHER): Payer: Medicare HMO | Admitting: *Deleted

## 2020-12-13 DIAGNOSIS — J309 Allergic rhinitis, unspecified: Secondary | ICD-10-CM | POA: Diagnosis not present

## 2020-12-21 ENCOUNTER — Encounter: Payer: Self-pay | Admitting: Gastroenterology

## 2020-12-21 ENCOUNTER — Other Ambulatory Visit: Payer: Self-pay

## 2020-12-21 ENCOUNTER — Ambulatory Visit (AMBULATORY_SURGERY_CENTER): Payer: Medicare HMO | Admitting: Gastroenterology

## 2020-12-21 VITALS — BP 99/48 | HR 63 | Temp 97.5°F | Resp 18 | Ht 59.0 in | Wt 230.0 lb

## 2020-12-21 DIAGNOSIS — D123 Benign neoplasm of transverse colon: Secondary | ICD-10-CM | POA: Diagnosis not present

## 2020-12-21 DIAGNOSIS — Z1211 Encounter for screening for malignant neoplasm of colon: Secondary | ICD-10-CM | POA: Diagnosis not present

## 2020-12-21 MED ORDER — SODIUM CHLORIDE 0.9 % IV SOLN
500.0000 mL | Freq: Once | INTRAVENOUS | Status: DC
Start: 2020-12-21 — End: 2020-12-21

## 2020-12-21 NOTE — Op Note (Signed)
[address] Patient Name: Reyann Troop Procedure Date: 12/21/2020 10:27 AM MRN: [id number] Endoscopist: Gerrit Heck , MD Age: 75 Referring MD:  Date of Birth: 26-Feb-1946 Gender: Female Account #: [id number] Procedure:                Colonoscopy Indications:              Screening for colorectal malignant neoplasm (last                            colonoscopy was 10 years ago) Medicines:                Monitored Anesthesia Care Procedure:                Pre-Anesthesia Assessment:                           - Prior to the procedure, a History and Physical                            was performed, and patient medications and                            allergies were reviewed. The patient's tolerance of                            previous anesthesia was also reviewed. The risks                            and benefits of the procedure and the sedation                            options and risks were discussed with the patient.                            All questions were answered, and informed consent                            was obtained. Prior Anticoagulants: The patient has                            taken no previous anticoagulant or antiplatelet                            agents. ASA Grade Assessment: III - A patient with                            severe systemic disease. After reviewing the risks                            and benefits, the patient was deemed in                            satisfactory condition to undergo the procedure.  After obtaining informed consent, the colonoscope                            was passed under direct vision. Throughout the                            procedure, the patient's blood pressure, pulse, and                            oxygen saturations were monitored continuously. The                            Olympus [id number]) [id number] was introduced                            through the anus and  advanced to the the cecum,                            identified by appendiceal orifice and ileocecal                            valve. The colonoscopy was technically difficult                            and complex due to a redundant colon and                            significant looping. Successful completion of the                            procedure was aided by changing the patient to a                            supine position and using manual pressure. The                            patient tolerated the procedure well. The quality                            of the bowel preparation was good. The ileocecal                            valve, appendiceal orifice, and rectum were                            photographed. Scope In: 10:42:36 AM Scope Out: 11:07:06 AM Scope Withdrawal Time: 0 hours 10 minutes 45 seconds  Total Procedure Duration: 0 hours 24 minutes 30 seconds  Findings:                 The perianal and digital rectal examinations were                            normal.  A 8 mm polyp was found in the transverse colon. The                            polyp was sessile. The polyp was removed with a                            cold snare. Resection and retrieval were complete.                            Estimated blood loss was minimal.                           The colon was redundant and the ascending colon                            revealed significantly excessive looping. Advancing                            the scope required changing the patient to a supine                            position and using manual pressure. Able to                            intubate the cecum, but due to significant looping                            and a large IC valve, approximately 50% of the                            cecal surface could not be seen.                           Retroflexion in the rectum was not performed due to                             narrowed rectal vault. Anterograde views were                            otherwise normal. Complications:            No immediate complications. Estimated Blood Loss:     Estimated blood loss was minimal. Impression:               - One 8 mm polyp in the transverse colon, removed                            with a cold snare. Resected and retrieved.                           - There was significant looping of the colon. Able                            to  intubate the cecum, but due to significant                            looping and IC valve positioning, approximately 50%                            of the cecal surface could not be seen. Cannot rule                            out the presence of small polyp(s) in this area. Recommendation:           - Patient has a contact number available for                            emergencies. The signs and symptoms of potential                            delayed complications were discussed with the                            patient. Return to normal activities tomorrow.                            Written discharge instructions were provided to the                            patient.                           - Resume previous diet.                           - Continue present medications.                           - Await pathology results.                           - Repeat colonoscopy for surveillance based on                            pathology results. If planning future colonoscopy,                            recommend abdominal binder placed pre-procedure.                           - Given inability to fully clear the cecum,                            recommend perform a virtual colonoscopy to fully                            evaluate for a small proximal polyp at appointment  to be scheduled. Gerrit Heck, MD 12/21/2020 11:19:51 AM

## 2020-12-21 NOTE — Patient Instructions (Signed)
Please read handouts provided. Continue present medications. Await pathology results.   YOU HAD AN ENDOSCOPIC PROCEDURE TODAY AT THE [facility]:   Refer to the procedure report that was given to you for any specific questions about what was found during the examination.  If the procedure report does not answer your questions, please call your gastroenterologist to clarify.  If you requested that your care partner not be given the details of your procedure findings, then the procedure report has been included in a sealed envelope for you to review at your convenience later.  YOU SHOULD EXPECT: Some feelings of bloating in the abdomen. Passage of more gas than usual.  Walking can help get rid of the air that was put into your GI tract during the procedure and reduce the bloating. If you had a lower endoscopy (such as a colonoscopy or flexible sigmoidoscopy) you may notice spotting of blood in your stool or on the toilet paper. If you underwent a bowel prep for your procedure, you may not have a normal bowel movement for a few days.  Please Note:  You might notice some irritation and congestion in your nose or some drainage.  This is from the oxygen used during your procedure.  There is no need for concern and it should clear up in a day or so.  SYMPTOMS TO REPORT IMMEDIATELY:  Following lower endoscopy (colonoscopy or flexible sigmoidoscopy):  Excessive amounts of blood in the stool  Significant tenderness or worsening of abdominal pains  Swelling of the abdomen that is new, acute  Fever of 100F or higher   For urgent or emergent issues, a gastroenterologist can be reached at any hour by calling [phone]. Do not use MyChart messaging for urgent concerns.    DIET:  We do recommend a small meal at first, but then you may proceed to your regular diet.  Drink plenty of fluids but you should avoid alcoholic beverages for 24 hours.  ACTIVITY:  You should plan to take it easy  for the rest of today and you should NOT DRIVE or use heavy machinery until tomorrow (because of the sedation medicines used during the test).    FOLLOW UP: Our staff will call the number listed on your records 48-72 hours following your procedure to check on you and address any questions or concerns that you may have regarding the information given to you following your procedure. If we do not reach you, we will leave a message.  We will attempt to reach you two times.  During this call, we will ask if you have developed any symptoms of COVID 19. If you develop any symptoms (ie: fever, flu-like symptoms, shortness of breath, cough etc.) before then, please call [phone].  If you test positive for Covid 19 in the 2 weeks post procedure, please call and report this information to us.    If any biopsies were taken you will be contacted by phone or by letter within the next 1-3 weeks.  Please call us at [phone] if you have not heard about the biopsies in 3 weeks.    SIGNATURES/CONFIDENTIALITY: You and/or your care partner have signed paperwork which will be entered into your electronic medical record.  These signatures attest to the fact that that the information above on your After Visit Summary has been reviewed and is understood.  Full responsibility of the confidentiality of this discharge information lies with you and/or your care-partner.  

## 2020-12-21 NOTE — Progress Notes (Signed)
Called to room to assist during endoscopic procedure.  Patient ID and intended procedure confirmed with present staff. Received instructions for my participation in the procedure from the performing physician.  

## 2020-12-21 NOTE — Progress Notes (Signed)
pt tolerated well. VSS. awake and to recovery. Report given to RN.  

## 2020-12-21 NOTE — Progress Notes (Signed)
Pt's states no medical or surgical changes since previsit or office visit. 

## 2020-12-21 NOTE — Progress Notes (Signed)
C.W. vital signs. 

## 2020-12-23 ENCOUNTER — Telehealth: Payer: Self-pay

## 2020-12-23 ENCOUNTER — Telehealth: Payer: Self-pay | Admitting: *Deleted

## 2020-12-23 DIAGNOSIS — K635 Polyp of colon: Secondary | ICD-10-CM

## 2020-12-23 DIAGNOSIS — D123 Benign neoplasm of transverse colon: Secondary | ICD-10-CM

## 2020-12-23 NOTE — Telephone Encounter (Signed)
  Follow up Call-  Call back number 12/21/2020  Post procedure Call Back phone  # [phone]  Permission to leave phone message No  Some recent data might be hidden     Patient questions:  Do you have a fever, pain , or abdominal swelling? No. Pain Score  0 *  Have you tolerated food without any problems? Yes.    Have you been able to return to your normal activities? Yes.    Do you have any questions about your discharge instructions: Diet   No. Medications  No. Follow up visit  No.  Do you have questions or concerns about your Care? No.  Actions: * If pain score is 4 or above: No action needed, pain <4.  1. Have you developed a fever since your procedure? no  2.   Have you had an respiratory symptoms (SOB or cough) since your procedure? no  3.   Have you tested positive for COVID 19 since your procedure no  4.   Have you had any family members/close contacts diagnosed with the COVID 19 since your procedure? no   If yes to any of these questions please route to Joylene John, RN and Joella Prince, RN

## 2020-12-23 NOTE — Telephone Encounter (Signed)
First post procedure follow up call, no answer 

## 2020-12-23 NOTE — Addendum Note (Signed)
Addended by: Yevette Edwards on: 12/23/2020 02:31 PM   Modules accepted: Orders

## 2020-12-23 NOTE — Telephone Encounter (Signed)
Patient has been scheduled for a virtual colonoscopy at [facility]  ([address] location) on Monday, 01/16/21 at 9 am, arrive at 8:40 AM. Pick up prep kit 4 days. Patient is aware of appointment information and instructions.

## 2020-12-26 ENCOUNTER — Ambulatory Visit (INDEPENDENT_AMBULATORY_CARE_PROVIDER_SITE_OTHER): Payer: Medicare HMO | Admitting: *Deleted

## 2020-12-26 DIAGNOSIS — J309 Allergic rhinitis, unspecified: Secondary | ICD-10-CM | POA: Diagnosis not present

## 2020-12-28 ENCOUNTER — Other Ambulatory Visit: Payer: Self-pay | Admitting: Cardiology

## 2020-12-30 ENCOUNTER — Encounter: Payer: Self-pay | Admitting: Gastroenterology

## 2021-01-03 ENCOUNTER — Other Ambulatory Visit: Payer: Self-pay | Admitting: Family Medicine

## 2021-01-03 ENCOUNTER — Ambulatory Visit (INDEPENDENT_AMBULATORY_CARE_PROVIDER_SITE_OTHER): Payer: Medicare HMO | Admitting: *Deleted

## 2021-01-03 DIAGNOSIS — J309 Allergic rhinitis, unspecified: Secondary | ICD-10-CM

## 2021-01-05 ENCOUNTER — Telehealth: Payer: Self-pay | Admitting: Family Medicine

## 2021-01-05 NOTE — Telephone Encounter (Signed)
States she would like some pain medication. Patient refuse appt stating Dr.Blyth normally just call in the medication

## 2021-01-09 ENCOUNTER — Other Ambulatory Visit: Payer: Self-pay | Admitting: Family Medicine

## 2021-01-09 MED ORDER — TIZANIDINE HCL 2 MG PO TABS: ORAL_TABLET | ORAL | 0 refills | Status: DC

## 2021-01-09 NOTE — Telephone Encounter (Signed)
I sent in Tizanidine

## 2021-01-09 NOTE — Telephone Encounter (Signed)
Patient could not remember what she had, but she said it was muscle relaxer for her neck.  The last muscle relaxer in her history that was given was tizanidine 2mg .  She has an appointment next month with you.

## 2021-01-09 NOTE — Telephone Encounter (Signed)
Patient notified that rx has been sent in. 

## 2021-01-11 ENCOUNTER — Ambulatory Visit (INDEPENDENT_AMBULATORY_CARE_PROVIDER_SITE_OTHER): Payer: Medicare HMO

## 2021-01-11 DIAGNOSIS — J309 Allergic rhinitis, unspecified: Secondary | ICD-10-CM

## 2021-01-16 ENCOUNTER — Ambulatory Visit
Admission: RE | Admit: 2021-01-16 | Discharge: 2021-01-16 | Disposition: A | Discharge status: Home / Self Care | Payer: Medicare HMO | Source: Ambulatory Visit | Attending: Gastroenterology | Admitting: Gastroenterology

## 2021-01-16 DIAGNOSIS — K635 Polyp of colon: Secondary | ICD-10-CM | POA: Diagnosis not present

## 2021-01-16 DIAGNOSIS — K449 Diaphragmatic hernia without obstruction or gangrene: Secondary | ICD-10-CM | POA: Diagnosis not present

## 2021-01-16 DIAGNOSIS — M47819 Spondylosis without myelopathy or radiculopathy, site unspecified: Secondary | ICD-10-CM | POA: Diagnosis not present

## 2021-01-16 DIAGNOSIS — D123 Benign neoplasm of transverse colon: Secondary | ICD-10-CM

## 2021-01-16 DIAGNOSIS — K3189 Other diseases of stomach and duodenum: Secondary | ICD-10-CM | POA: Diagnosis not present

## 2021-01-17 ENCOUNTER — Telehealth: Payer: Self-pay | Admitting: General Surgery

## 2021-01-17 ENCOUNTER — Ambulatory Visit (INDEPENDENT_AMBULATORY_CARE_PROVIDER_SITE_OTHER): Payer: Medicare HMO | Admitting: *Deleted

## 2021-01-17 DIAGNOSIS — J309 Allergic rhinitis, unspecified: Secondary | ICD-10-CM

## 2021-01-17 NOTE — Telephone Encounter (Signed)
LVM for the patient to call the office back to go over pathology

## 2021-01-17 NOTE — Telephone Encounter (Signed)
-----   Message from [place], DO sent at 01/16/2021  1:26 PM EDT ----- Good news, the virtual colonoscopy does not show any polyps, masses, or other worrisome features in the colon.  There was no stricture noted.  Based on the finding of a single tubular adenoma at the time of colonoscopy, can consider repeat colonoscopy in 7 years for ongoing surveillance.  Recommend follow-up in the office at that time rather than scheduling direct for colonoscopy due to age at that time (81) and these findings.

## 2021-01-19 NOTE — Telephone Encounter (Signed)
Letter sent to patient regarding pathology of virtual colon

## 2021-01-23 ENCOUNTER — Ambulatory Visit (INDEPENDENT_AMBULATORY_CARE_PROVIDER_SITE_OTHER): Payer: Medicare HMO

## 2021-01-23 DIAGNOSIS — J309 Allergic rhinitis, unspecified: Secondary | ICD-10-CM | POA: Diagnosis not present

## 2021-01-26 ENCOUNTER — Encounter: Payer: Self-pay | Admitting: Family Medicine

## 2021-01-26 ENCOUNTER — Other Ambulatory Visit: Payer: Self-pay

## 2021-01-26 ENCOUNTER — Telehealth (INDEPENDENT_AMBULATORY_CARE_PROVIDER_SITE_OTHER): Payer: Medicare HMO | Admitting: Family Medicine

## 2021-01-26 DIAGNOSIS — E559 Vitamin D deficiency, unspecified: Secondary | ICD-10-CM | POA: Diagnosis not present

## 2021-01-26 DIAGNOSIS — T7840XD Allergy, unspecified, subsequent encounter: Secondary | ICD-10-CM | POA: Diagnosis not present

## 2021-01-26 DIAGNOSIS — J4520 Mild intermittent asthma, uncomplicated: Secondary | ICD-10-CM | POA: Diagnosis not present

## 2021-01-26 DIAGNOSIS — I10 Essential (primary) hypertension: Secondary | ICD-10-CM | POA: Diagnosis not present

## 2021-01-26 DIAGNOSIS — F4321 Adjustment disorder with depressed mood: Secondary | ICD-10-CM

## 2021-01-26 DIAGNOSIS — I509 Heart failure, unspecified: Secondary | ICD-10-CM | POA: Diagnosis not present

## 2021-01-26 DIAGNOSIS — R739 Hyperglycemia, unspecified: Secondary | ICD-10-CM

## 2021-01-26 NOTE — Progress Notes (Signed)
MyChart Video Visit    Virtual Visit via Video Note   This visit type was conducted due to national recommendations for restrictions regarding the COVID-19 Pandemic (e.g. social distancing) in an effort to limit this patient's exposure and mitigate transmission in our community. This patient is at least at moderate risk for complications without adequate follow up. This format is felt to be most appropriate for this patient at this time. Physical exam was limited by quality of the video and audio technology used for the visit. S Chism was able to get the patient set up on a video visit.  Patient location: home Patient and provider in visit Provider location: Office  I discussed the limitations of evaluation and management by telemedicine and the availability of in person appointments. The patient expressed understanding and agreed to proceed.  Visit Date: 01/26/2021  Today's healthcare provider: Penni Homans, MD     Subjective:    Patient ID: Anne Barnes, female    DOB: 1946-08-29, 75 y.o.   MRN: [id number]  Chief Complaint  Patient presents with  . Follow-up    HPI Patient is in today for follow up on chronic medical concerns including hypertension, hyperlipidemia and more. She changed today's visit to virtual due to a recent family stress. Her grandson who is 41 was hit while driving his car this week by a motorcyclist and the motorcyclist died so he is very shook up. No CP/palp/sob/edeme noted. Her only c/o is feeling lightheaded in am and in pm at times and persistent fatigue. Her allergies have flared off and on but are [place] today. Asthma is well controlled. Denies CP/palp/SOB/HA/congestion/fevers/GI or GU c/o. Taking meds as prescribed  Past Medical History:  Diagnosis Date  . Acute combined systolic and diastolic heart failure ([place]) 05/26/2015  . ALLERGIC RHINITIS   . Allergic urticaria 08/01/2014   Patient reports allergies to shrimp, egg whites, tomatoes, dairy   .  Allergy    seasonal and numerous food and drug allergies.  . 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   Office Spirometry 11/07/17-WNL-FVC 1.58/82%, FEV1 1.27/85%, ratio 0.80, FEF 25-75% 0.26/89%  . Bradycardia 02/25/2017  . Chiari malformation    Noted MRI brain 09/2013 - s/p neuro eval for same  . DIVERTICULOSIS, COLON   . Essential hypertension 08/28/2010   Qualifier: Diagnosis of  By: Marca Ancona RMA, Lucy     . Frequent headaches   . GERD   . H/O measles   . H/O mumps   . Hearing loss 01/18/2017  . History of chicken pox   . Hyperglycemia 03/05/2016  . HYPERTENSION   . Hypocalcemia 02/25/2017  . IBS (irritable bowel syndrome) 05/26/2015  . Nonspecific abnormal electrocardiogram (ECG) (EKG)   . OBSTRUCTIVE SLEEP APNEA 12/2008 dx   noncompliant with CPAP qhs  . Obstructive sleep apnea 08/28/2010   NPSG 12/2008:  AHI 13/hr with desats to 78%   . Preventative health care 06/11/2017  . Sciatica    right side  . Seasonal and perennial allergic rhinitis 08/28/2010   Allergy profile 03/11/14- positive especially for dust, cat and dog Food allergy profile- total IgE 86 with several food group elevations Sed rate-WNL     . Sinusitis 06/06/2017  . Sleep apnea    uses cpap  . 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 Vaginitis 01/18/2017  . Vitamin D deficiency 06/11/2017    Past Surgical History:  Procedure Laterality Date  . ABDOMINAL HYSTERECTOMY  1986  . [facility]  Breast biopsy  . COLONOSCOPY  2011   Dr Collene Mares  . UMBILICAL HERNIA REPAIR      Family History  Problem Relation Age of Onset  . Arthritis Mother        died of complications from hip surgery  . Arthritis Father   . Kidney disease Father   . Pneumonia Father   . Heart disease Father   . Lupus Daughter   . Rheum arthritis Daughter   . Sjogren's syndrome Daughter   . Fibromyalgia Daughter   . Diabetes Brother   . Heart disease Brother   . Heart attack Brother   . Alcohol abuse Brother   . Throat cancer Brother   . Lung cancer  Brother   . Kidney disease Sister        dialysis  . Obesity Sister   . Hypertension Maternal Grandfather   . Hypertension Sister   . Kidney disease Sister        dialysis  . Pneumonia Brother   . Hypertension Daughter   . Graves' disease Daughter   . Hypertension Daughter   . Alcohol abuse Other        parent  . Ataxia Neg Hx   . Chorea Neg Hx   . Dementia Neg Hx   . Mental retardation Neg Hx   . Migraines Neg Hx   . Multiple sclerosis Neg Hx   . Neurofibromatosis Neg Hx   . Neuropathy Neg Hx   . Parkinsonism Neg Hx   . Seizures Neg Hx   . Stroke Neg Hx   . Colon cancer Neg Hx   . Colon polyps Neg Hx   . Esophageal cancer Neg Hx   . Rectal cancer Neg Hx   . Stomach cancer Neg Hx     Social History   Socioeconomic History  . Marital status: Widowed    Spouse name: Not on file  . Number of children: Not on file  . Years of education: Not on file  . Highest education level: Not on file  Occupational History  . Occupation: retired  Tobacco Use  . Smoking status: Never Smoker  . Smokeless tobacco: Never Used  . Tobacco comment: Married, lives with spouse, dtr 2 g-kids.  Vaping Use  . Vaping Use: Never used  Substance and Sexual Activity  . Alcohol use: No    Comment: rare  . Drug use: No  . Sexual activity: Not Currently    Comment: lives with husband, no dietary restrictions avoid foods allergic to, retired  Other Topics Concern  . Not on file  Social History Narrative   Right handed   One story with daughter and two grandkids   Social Determinants of Health   Financial Resource Strain: Low Risk   . Difficulty of Paying Living Expenses: Not hard at all  Food Insecurity: No Food Insecurity  . Worried About Charity fundraiser in the Last Year: Never true  . Ran Out of Food in the Last Year: Never true  Transportation Needs: No Transportation Needs  . Lack of Transportation (Medical): No  . Lack of Transportation (Non-Medical): No  Physical Activity:  Not on file  Stress: Not on file  Social Connections: Not on file  Intimate Partner Violence: Not on file    Outpatient Medications Prior to Visit  Medication Sig Dispense Refill  . albuterol (VENTOLIN HFA) 108 (90 Base) MCG/ACT inhaler INHALE 2 PUFFS INTO THE LUNGS EVERY 6 HOURS AS NEEDED FOR WHEEZING FOR SHORTNESS  OF BREATH 18 g 12  . ALPRAZolam (XANAX) 0.5 MG tablet TAKE 1 TABLET BY MOUTH TWICE DAILY AS NEEDED FOR ANXIETY AND FOR SLEEP 20 tablet 1  . amLODipine (NORVASC) 2.5 MG tablet Take 1 tablet by mouth twice daily 180 tablet 3  . azelastine (ASTELIN) 0.1 % nasal spray Place 2 sprays into both nostrils 2 (two) times daily. 30 mL 5  . b complex vitamins tablet Take 1 tablet by mouth daily.    . Cholecalciferol (VITAMIN D) 2000 UNITS CAPS Take by mouth daily.    Marland Kitchen EPINEPHrine 0.3 mg/0.3 mL IJ SOAJ injection Inject 0.3 mg into the muscle as needed for anaphylaxis. 2 each 1  . fluticasone (FLONASE) 50 MCG/ACT nasal spray Place 2 sprays into both nostrils daily. 16 g 6  . levocetirizine (XYZAL) 5 MG tablet TAKE 1 TABLET BY MOUTH ONCE DAILY IN THE EVENING 30 tablet 5  . losartan (COZAAR) 100 MG tablet Take 1 tablet (100 mg total) by mouth daily. 90 tablet 1  . MODERNA COVID-19 VACCINE 100 MCG/0.5ML injection     . montelukast (SINGULAIR) 10 MG tablet TAKE 1 TABLET BY MOUTH AT BEDTIME 90 tablet 0  . Multiple Vitamin (MULTIVITAMIN) tablet Take 1 tablet by mouth daily.    Marland Kitchen olopatadine (PATANOL) 0.1 % ophthalmic solution Place 1 drop into both eyes 2 (two) times daily. 5 mL 12  . omeprazole (PRILOSEC) 20 MG capsule Take 1 capsule by mouth once daily 90 capsule 0  . rosuvastatin (CRESTOR) 10 MG tablet Take 1 tablet by mouth once daily 90 tablet 3  . spironolactone (ALDACTONE) 25 MG tablet Take 1 tablet (25 mg total) by mouth daily. 90 tablet 3  . tiZANidine (ZANAFLEX) 2 MG tablet TAKE 1/2 TO 2 TABLETS BY MOUTH TWICE DAILY AS NEEDED FOR MUSCLE SPASM 30 tablet 0  . VITAMIN D, CHOLECALCIFEROL, PO  Take by mouth.     No facility-administered medications prior to visit.    Allergies  Allergen Reactions  . Codeine Nausea And Vomiting  . Eggs Or Egg-Derived Products   . Hydralazine Hcl Other (See Comments)    Pt reports causes dizziness  . Lisinopril-Hydrochlorothiazide Hives    REACTION: lip swelling,facial swelling,rash.  . Metoprolol Other (See Comments)    Does not feel well on it   . Other Cough    Patient states she is allergic to Shrimp, egg whites, tomatoes and dairy products.  Says they make her feel unwell but do not cause hives, SOB or anaphylaxis.  She just avoids eating them because they can make her have a headache.  Strawberries, watermelon, peanuts, and all shellfish.    . Shrimp [Shellfish Allergy] Other (See Comments)    Was allergy tested and showed shrimp was one--has not reacted b/c avoided  . Tomato Other (See Comments)    Review of Systems  Constitutional: Negative for fever and malaise/fatigue.  HENT: Negative for congestion.   Eyes: Negative for blurred vision.  Respiratory: Negative for shortness of breath.   Cardiovascular: Negative for chest pain, palpitations and leg swelling.  Gastrointestinal: Negative for abdominal pain, blood in stool and nausea.  Genitourinary: Negative for dysuria and frequency.  Musculoskeletal: Negative for falls.  Skin: Negative for rash.  Neurological: Negative for dizziness, loss of consciousness and headaches.  Endo/Heme/Allergies: Negative for environmental allergies.  Psychiatric/Behavioral: Negative for depression. The patient is not nervous/anxious.        Objective:    Physical Exam Constitutional:      Appearance: She  is obese. She is not ill-appearing.  HENT:     Head: Normocephalic and atraumatic.     Right Ear: External ear normal.     Left Ear: External ear normal.     Nose: Nose normal.  Eyes:     General:        Right eye: No discharge.        Left eye: No discharge.  Pulmonary:     Effort:  Pulmonary effort is normal.  Neurological:     Mental Status: She is oriented to person, place, and time.  Psychiatric:        Behavior: Behavior normal.     BP 135/76   Pulse 73   Temp (!) 97.5 F (36.4 C)   SpO2 98%  Wt Readings from Last 3 Encounters:  12/21/20 230 lb (104.3 kg)  12/01/20 231 lb 6.4 oz (105 kg)  11/16/20 232 lb 3.2 oz (105.3 kg)    Diabetic Foot Exam - Simple   No data filed    Lab Results  Component Value Date   WBC 8.8 10/20/2020   HGB 12.9 10/20/2020   HCT 39.2 10/20/2020   PLT 245.0 10/20/2020   GLUCOSE 89 10/20/2020   CHOL 128 10/20/2020   TRIG 45.0 10/20/2020   HDL 48.40 10/20/2020   LDLCALC 71 10/20/2020   ALT 27 10/20/2020   AST 16 10/20/2020   NA 141 10/20/2020   K 4.1 10/20/2020   CL 105 10/20/2020   CREATININE 0.89 10/20/2020   BUN 20 10/20/2020   CO2 31 10/20/2020   TSH 1.11 10/20/2020   INR 1.06 01/10/2015   HGBA1C 5.5 10/20/2020    Lab Results  Component Value Date   TSH 1.11 10/20/2020   Lab Results  Component Value Date   WBC 8.8 10/20/2020   HGB 12.9 10/20/2020   HCT 39.2 10/20/2020   MCV 92.0 10/20/2020   PLT 245.0 10/20/2020   Lab Results  Component Value Date   NA 141 10/20/2020   K 4.1 10/20/2020   CO2 31 10/20/2020   GLUCOSE 89 10/20/2020   BUN 20 10/20/2020   CREATININE 0.89 10/20/2020   BILITOT 0.4 10/20/2020   ALKPHOS 87 10/20/2020   AST 16 10/20/2020   ALT 27 10/20/2020   PROT 7.2 10/20/2020   ALBUMIN 4.0 10/20/2020   CALCIUM 9.3 10/20/2020   ANIONGAP 9 09/15/2017   GFR 64.46 10/20/2020   Lab Results  Component Value Date   CHOL 128 10/20/2020   Lab Results  Component Value Date   HDL 48.40 10/20/2020   Lab Results  Component Value Date   LDLCALC 71 10/20/2020   Lab Results  Component Value Date   TRIG 45.0 10/20/2020   Lab Results  Component Value Date   CHOLHDL 3 10/20/2020   Lab Results  Component Value Date   HGBA1C 5.5 10/20/2020       Assessment & Plan:    Problem List Items Addressed This Visit    Essential hypertension    Monitor and report any concerns, no changes to meds. Encouraged heart healthy diet such as the DASH diet and exercise as tolerated.       CHF (congestive heart failure) (HCC)    No CP/palp/sob/edeme noted. Her only c/o is feeling lightheaded in am and in pm at times and persistent fatigue. She has an appt with cardiology next month and she will discuss but she is advised this sounds more like fluctuating blood sugars so work on getting proteins  more and less carbs. Stay active. She is hydrating well      Hyperglycemia    hgba1c acceptable, minimize simple carbs. Increase exercise as tolerated. Encouraged to always eat proteins with carbs and avoid simple carbs. Stay active      Vitamin D deficiency    Supplement and monitor      Asthma, mild intermittent    No c/o recent exacerbations, no changes      Grief reaction    Her grandson who is 18 was hit while driving his car this week by a motorcyclist and the motorcyclist died so he is very shook up, she is encouraged to help him get counseling      Allergies    These have been bothering her intermittently but are better now, they contribute to headaches but these respond to OTC meds so she is feeling they are well enough controlled on current meds         I am having Anne Barnes maintain her multivitamin, Vitamin D, b complex vitamins, omeprazole, fluticasone, olopatadine, azelastine, albuterol, EPINEPHrine, ALPRAZolam, spironolactone, rosuvastatin, (VITAMIN D, CHOLECALCIFEROL, PO), Moderna COVID-19 Vaccine, levocetirizine, montelukast, amLODipine, losartan, and tiZANidine.  No orders of the defined types were placed in this encounter.   I discussed the assessment and treatment plan with the patient. The patient was provided an opportunity to ask questions and all were answered. The patient agreed with the plan and demonstrated an understanding of the  instructions.   The patient was advised to call back or seek an in-person evaluation if the symptoms worsen or if the condition fails to improve as anticipated.  I provided 22 minutes of face-to-face time during this encounter.   Penni Homans, MD [facility] at [facility] [phone] (phone) [phone] (fax)  [place]

## 2021-01-26 NOTE — Assessment & Plan Note (Signed)
No c/o recent exacerbations, no changes

## 2021-01-26 NOTE — Assessment & Plan Note (Signed)
These have been bothering her intermittently but are better now, they contribute to headaches but these respond to OTC meds so she is feeling they are well enough controlled on current meds

## 2021-01-26 NOTE — Assessment & Plan Note (Signed)
Her grandson who is 59 was hit while driving his car this week by a motorcyclist and the motorcyclist died so he is very shook up, she is encouraged to help him get counseling

## 2021-01-26 NOTE — Assessment & Plan Note (Signed)
hgba1c acceptable, minimize simple carbs. Increase exercise as tolerated. Encouraged to always eat proteins with carbs and avoid simple carbs. Stay active

## 2021-01-26 NOTE — Assessment & Plan Note (Signed)
Monitor and report any concerns, no changes to meds. Encouraged heart healthy diet such as the DASH diet and exercise as tolerated.  ?

## 2021-01-26 NOTE — Assessment & Plan Note (Signed)
Supplement and monitor 

## 2021-01-26 NOTE — Assessment & Plan Note (Signed)
No CP/palp/sob/edeme noted. Her only c/o is feeling lightheaded in am and in pm at times and persistent fatigue. She has an appt with cardiology next month and she will discuss but she is advised this sounds more like fluctuating blood sugars so work on getting proteins more and less carbs. Stay active. She is hydrating well

## 2021-01-30 ENCOUNTER — Ambulatory Visit (INDEPENDENT_AMBULATORY_CARE_PROVIDER_SITE_OTHER): Payer: Medicare HMO | Admitting: *Deleted

## 2021-01-30 DIAGNOSIS — J309 Allergic rhinitis, unspecified: Secondary | ICD-10-CM

## 2021-02-07 ENCOUNTER — Ambulatory Visit (INDEPENDENT_AMBULATORY_CARE_PROVIDER_SITE_OTHER): Payer: Medicare HMO | Admitting: *Deleted

## 2021-02-07 DIAGNOSIS — J309 Allergic rhinitis, unspecified: Secondary | ICD-10-CM

## 2021-02-10 ENCOUNTER — Other Ambulatory Visit: Payer: Self-pay | Admitting: Allergy

## 2021-02-10 DIAGNOSIS — J3089 Other allergic rhinitis: Secondary | ICD-10-CM

## 2021-02-13 ENCOUNTER — Ambulatory Visit (INDEPENDENT_AMBULATORY_CARE_PROVIDER_SITE_OTHER): Payer: Medicare HMO

## 2021-02-13 DIAGNOSIS — J309 Allergic rhinitis, unspecified: Secondary | ICD-10-CM

## 2021-02-15 DIAGNOSIS — J3089 Other allergic rhinitis: Secondary | ICD-10-CM | POA: Diagnosis not present

## 2021-02-15 NOTE — Progress Notes (Signed)
VIAL EXP 02-15-22

## 2021-02-16 DIAGNOSIS — G4733 Obstructive sleep apnea (adult) (pediatric): Secondary | ICD-10-CM | POA: Diagnosis not present

## 2021-02-21 DIAGNOSIS — G4733 Obstructive sleep apnea (adult) (pediatric): Secondary | ICD-10-CM | POA: Diagnosis not present

## 2021-02-22 ENCOUNTER — Ambulatory Visit (INDEPENDENT_AMBULATORY_CARE_PROVIDER_SITE_OTHER): Payer: Medicare HMO

## 2021-02-22 DIAGNOSIS — J309 Allergic rhinitis, unspecified: Secondary | ICD-10-CM

## 2021-03-01 ENCOUNTER — Ambulatory Visit (INDEPENDENT_AMBULATORY_CARE_PROVIDER_SITE_OTHER): Payer: Medicare HMO

## 2021-03-01 DIAGNOSIS — J309 Allergic rhinitis, unspecified: Secondary | ICD-10-CM

## 2021-03-02 NOTE — Progress Notes (Signed)
Cardiology Office Note:    Date:  03/03/2021   ID:  Anne Barnes, DOB 1946-02-19, MRN [id number]  PCP:  Mosie Lukes, MD   [facility] Providers Cardiologist:  Freada Bergeron, MD      Referring MD: Mosie Lukes, MD   Chief Complaint:  Follow-up (CAD, edema)    Patient Profile:    Anne Barnes is a 75 y.o. female with:  Coronary artery disease  Non-obs dz; Ca2+ score 127 on CTA in 1/21 (HFpEF) heart failure with preserved ejection fraction  Bradycardia 2/2 beta-blocker  Hypertension  Hyperlipidemia  OSA intol to CPAP Asthma  IBS   Prior CV studies: LONG TERM MONITOR (8-14 DAYS) INTERPRETATION 12/29/2019 Narrative  Sinus bradycardia to sinus tachycardia. Patient had a min HR of 39 bpm, max HR of 167 bpm, and avg HR of 57 bpm.  Very short episodes of SVT, no pauses no atrial fibrillation. Short episodes of SVT, no other significant arrhythmias.  CT CORONARY MORPH W/CTA COR W/SCORE W/CA W/CM &/OR WO/CM 11/20/2019 Coronary Arteries:  Normal coronary origin.  Right dominance. RCA is a large dominant artery that gives rise to PDA and PLA. There is significant motion, however there is only mild mixed plaque in the proximal and mid RCA with 25-49% plaque. Left main is a large artery that gives rise to LAD and LCX arteries. Left main has no plaque. LAD is a large vessel that has mild calcified plaque in the proximal and mid LAD with stenosis 0-25%. Distal LAD has no significant plaque. D1 is a very small branch with no plaque. LCX is a non-dominant artery that gives rise to two OM branches, there is minimal plaque.  IMPRESSION: 1. Coronary calcium score of 127. This was 4 percentile for age and sex matched control. 2. Normal coronary origin with right dominance. 3. CAD-RADS 2. Mild non-obstructive CAD (25-49%). Consider non-atherosclerotic causes of chest pain. Consider preventive therapy and risk factor modification.  GATED SPECT MYO PERF  W/LEXISCAN STRESS 2D 03/26/2017 EF 66, no sig ischemia, low risk   Echocardiogram 03/06/17 EF 60-65, no RWMA   History of Present Illness: Anne Barnes was last seen by Dr. Johney Frame in 3/22.  She returns for f/u.  She is here alone. She has a long hx of L sided chest pain.  This occurs at rest and only lasts seconds.  She has not had exertional chest pain.  She has not had any significant change.  She has chronic shortness of breath with exertion.  She has to stop 1/2 way up a flight of steps to catch her breath.  She sleeps in a recliner sometimes but has not had orthopnea.  She has persistent leg edema.  She has not had syncope. But, has gotten lightheaded in the past with with systolic BPs in the low 326Z. She sometimes has low BP in the AM and holds off on taking her meds.  She forgot to take her meds today prior to coming to the [place].      Past Medical History:  Diagnosis Date   Acute combined systolic and diastolic heart failure ([place]) 05/26/2015   ALLERGIC RHINITIS    Allergic urticaria 08/01/2014   Patient reports allergies to shrimp, egg whites, tomatoes, dairy    Allergy    seasonal and numerous food and drug allergies.   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   Office Spirometry 11/07/17-WNL-FVC 1.58/82%, FEV1 1.27/85%, ratio 0.80, FEF 25-75% 0.26/89%   Bradycardia 02/25/2017   Chiari malformation  Noted MRI brain 09/2013 - s/p neuro eval for same   DIVERTICULOSIS, COLON    Essential hypertension 08/28/2010   Qualifier: Diagnosis of  By: Marca Ancona RMA, Lucy      Frequent headaches    GERD    H/O measles    H/O mumps    Hearing loss 01/18/2017   History of chicken pox    Hyperglycemia 03/05/2016   HYPERTENSION    Hypocalcemia 02/25/2017   IBS (irritable bowel syndrome) 05/26/2015   Nonspecific abnormal electrocardiogram (ECG) (EKG)    OBSTRUCTIVE SLEEP APNEA 12/2008 dx   noncompliant with CPAP qhs   Obstructive sleep apnea 08/28/2010   NPSG 12/2008:  AHI 13/hr with desats to 78%    Preventative  health care 06/11/2017   Sciatica    right side   Seasonal and perennial allergic rhinitis 08/28/2010   Allergy profile 03/11/14- positive especially for dust, cat and dog Food allergy profile- total IgE 86 with several food group elevations Sed rate-WNL      Sinusitis 06/06/2017   Sleep apnea    uses cpap   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   Vaginitis 01/18/2017   Vitamin D deficiency 06/11/2017    Current Medications: Current Meds  Medication Sig   albuterol (VENTOLIN HFA) 108 (90 Base) MCG/ACT inhaler INHALE 2 PUFFS INTO THE LUNGS EVERY 6 HOURS AS NEEDED FOR WHEEZING FOR SHORTNESS OF BREATH   ALPRAZolam (XANAX) 0.5 MG tablet TAKE 1 TABLET BY MOUTH TWICE DAILY AS NEEDED FOR ANXIETY AND FOR SLEEP   amLODipine (NORVASC) 2.5 MG tablet Take 1 tablet by mouth twice daily   aspirin EC 81 MG tablet Take 81 mg by mouth daily. Swallow whole.   Azelastine HCl 137 MCG/SPRAY SOLN USE 2 SPRAY(S) IN EACH NOSTRIL TWICE DAILY   b complex vitamins tablet Take 1 tablet by mouth daily.   Cholecalciferol (VITAMIN D) 2000 UNITS CAPS Take by mouth daily.   EPINEPHrine 0.3 mg/0.3 mL IJ SOAJ injection Inject 0.3 mg into the muscle as needed for anaphylaxis.   fluticasone (FLONASE) 50 MCG/ACT nasal spray Place 2 sprays into both nostrils daily.   furosemide (LASIX) 20 MG tablet Take 1 tablet (20 mg total) by mouth daily.   levocetirizine (XYZAL) 5 MG tablet TAKE 1 TABLET BY MOUTH ONCE DAILY IN THE EVENING   losartan (COZAAR) 100 MG tablet Take 1 tablet (100 mg total) by mouth daily.   MODERNA COVID-19 VACCINE 100 MCG/0.5ML injection    montelukast (SINGULAIR) 10 MG tablet TAKE 1 TABLET BY MOUTH AT BEDTIME   Multiple Vitamin (MULTIVITAMIN) tablet Take 1 tablet by mouth daily.   olopatadine (PATANOL) 0.1 % ophthalmic solution Place 1 drop into both eyes 2 (two) times daily.   omeprazole (PRILOSEC) 20 MG capsule Take 1 capsule by mouth once daily   rosuvastatin (CRESTOR) 10 MG tablet Take 1 tablet by mouth once  daily   spironolactone (ALDACTONE) 25 MG tablet Take 1 tablet (25 mg total) by mouth daily.   tiZANidine (ZANAFLEX) 2 MG tablet TAKE 1/2 TO 2 TABLETS BY MOUTH TWICE DAILY AS NEEDED FOR MUSCLE SPASM   VITAMIN D, CHOLECALCIFEROL, PO Take by mouth.     Allergies:   Codeine, Eggs or egg-derived products, Hydralazine hcl, Lisinopril-hydrochlorothiazide, Metoprolol, Other, Shrimp [shellfish allergy], and Tomato   Social History   Tobacco Use   Smoking status: Never   Smokeless tobacco: Never   Tobacco comments:    Married, lives with spouse, dtr 2 g-kids.  Vaping Use   Vaping  Use: Never used  Substance Use Topics   Alcohol use: No    Comment: rare   Drug use: No     Family Hx: The patient's family history includes Alcohol abuse in her brother and another family member; Arthritis in her father and mother; Diabetes in her brother; Fibromyalgia in her daughter; Berenice Primas' disease in her daughter; Heart attack in her brother; Heart disease in her brother and father; Hypertension in her daughter, daughter, maternal grandfather, and sister; Kidney disease in her father, sister, and sister; Lung cancer in her brother; Lupus in her daughter; Obesity in her sister; Pneumonia in her brother and father; Rheum arthritis in her daughter; Sjogren's syndrome in her daughter; Throat cancer in her brother. There is no history of Ataxia, Chorea, Dementia, Mental retardation, Migraines, Multiple sclerosis, Neurofibromatosis, Neuropathy, Parkinsonism, Seizures, Stroke, Colon cancer, Colon polyps, Esophageal cancer, Rectal cancer, or Stomach cancer.  ROS   EKGs/Labs/Other Test Reviewed:    EKG:  EKG is not ordered today.  The ekg ordered today demonstrates n/a  Recent Labs: 10/20/2020: ALT 27; BUN 20; Creatinine, Ser 0.89; Hemoglobin 12.9; Platelets 245.0; Potassium 4.1; Sodium 141; TSH 1.11   Recent Lipid Panel Lab Results  Component Value Date/Time   CHOL 128 10/20/2020 11:50 AM   CHOL 116 02/17/2020 09:04  AM   TRIG 45.0 10/20/2020 11:50 AM   TRIG 55 12/30/2009 12:00 AM   HDL 48.40 10/20/2020 11:50 AM   HDL 42 02/17/2020 09:04 AM   LDLCALC 71 10/20/2020 11:50 AM   LDLCALC 64 02/17/2020 09:04 AM      Risk Assessment/Calculations:      Physical Exam:    VS:  BP (!) 144/60   Pulse 88   Ht 4\' 11"  (1.499 m)   Wt 226 lb (102.5 kg)   SpO2 97%   BMI 45.65 kg/m     Wt Readings from Last 3 Encounters:  03/03/21 226 lb (102.5 kg)  12/21/20 230 lb (104.3 kg)  12/01/20 231 lb 6.4 oz (105 kg)     Constitutional:      Appearance: Healthy appearance. Not in distress.  Pulmonary:     Effort: Pulmonary effort is normal.     Breath sounds: No wheezing. No rales.  Cardiovascular:     Normal rate. Regular rhythm. Normal S1. Normal S2.      Murmurs: There is no murmur.  Edema:    Peripheral edema present.    Pretibial: bilateral 1+ edema of the pretibial area.    Ankle: bilateral 2+ edema of the ankle. Abdominal:     Palpations: Abdomen is soft.  Musculoskeletal:     Cervical back: Neck supple. Skin:    General: Skin is warm and dry.  Neurological:     Mental Status: Alert and oriented to person, place and time.     Cranial Nerves: Cranial nerves are intact.         ASSESSMENT & PLAN:    1. Chronic heart failure with preserved ejection fraction ([place]) 2. Shortness of breath Normal EF on echocardiogram in 2018.  NYHA 2b-3.  She has some evidence of volume excess on exam.  She previously stopped furosemide due to leg cramps.  Spironolactone has been increased since then.  I have recommended starting back on furosemide.  She is already on Spironolactone.  We discussed the rationale for SGLT2i in the management of HFpEF.  We can consider this in the future as well as transitioning ARB to ARNI if her BP is above target.    -  BMET, BNP today  -Furosemide 20 mg once daily  -BMET 1 week  -Echocardiogram   -F/u in 8 weeks  3. Coronary artery disease involving native coronary artery of  native heart without angina pectoris Mild non-obstructive dz on CTA last year.  She has some occasional non-exertional chest pain that sounds non-cardiac. Continue ASA, statin.  No further testing is needed at this time.    4. Essential hypertension BP above target today but she has not taken any meds.   She notes low BPs at home sometimes.  I have asked her to track her BP for 2 weeks and send to me via MyChart.  If BP is running low, we could consider stopping Amlodipine.  This could help improve her swelling somewhat as well.   5. Pure hypercholesterolemia Continue statin Rx.  Goal LDL < 70.      Dispo:  Return in about 8 weeks (around 04/28/2021) for Routine follow up in 8 weeks with Richardson Dopp, PA..   Medication Adjustments/Labs and Tests Ordered: Current medicines are reviewed at length with the patient today.  Concerns regarding medicines are outlined above.  Tests Ordered: Orders Placed This Encounter  Procedures   Basic Metabolic Panel (BMET)   Basic Metabolic Panel (BMET)   Pro b natriuretic peptide   ECHOCARDIOGRAM COMPLETE    Medication Changes: Meds ordered this encounter  Medications   furosemide (LASIX) 20 MG tablet    Sig: Take 1 tablet (20 mg total) by mouth daily.    Dispense:  90 tablet    Refill:  3     Signed, Richardson Dopp, PA-C  03/03/2021 11:25 AM    [facility] [address], [place], [place]  [address] Phone: [phone]; Fax: [phone]

## 2021-03-03 ENCOUNTER — Encounter: Payer: Self-pay | Admitting: Physician Assistant

## 2021-03-03 ENCOUNTER — Other Ambulatory Visit: Payer: Self-pay

## 2021-03-03 ENCOUNTER — Ambulatory Visit (INDEPENDENT_AMBULATORY_CARE_PROVIDER_SITE_OTHER): Payer: Medicare HMO | Admitting: Physician Assistant

## 2021-03-03 VITALS — BP 144/60 | HR 88 | Ht 59.0 in | Wt 226.0 lb

## 2021-03-03 DIAGNOSIS — R0602 Shortness of breath: Secondary | ICD-10-CM

## 2021-03-03 DIAGNOSIS — I10 Essential (primary) hypertension: Secondary | ICD-10-CM | POA: Diagnosis not present

## 2021-03-03 DIAGNOSIS — I2510 Atherosclerotic heart disease of native coronary artery without angina pectoris: Secondary | ICD-10-CM | POA: Diagnosis not present

## 2021-03-03 DIAGNOSIS — I5032 Chronic diastolic (congestive) heart failure: Secondary | ICD-10-CM | POA: Diagnosis not present

## 2021-03-03 DIAGNOSIS — E7800 Pure hypercholesterolemia, unspecified: Secondary | ICD-10-CM | POA: Diagnosis not present

## 2021-03-03 MED ORDER — FUROSEMIDE 20 MG PO TABS: 20.0000 mg | ORAL_TABLET | Freq: Every day | ORAL | 3 refills | Status: DC

## 2021-03-03 NOTE — Patient Instructions (Signed)
Medication Instructions:   START Lasix one tablet by mouth ( 20 mg) daily, sent in # 90 to requested pharmacy.   *If you need a refill on your cardiac medications before your next appointment, please call your pharmacy*   Lab Work: TODAY:  BMET/PRO BNP  Your physician recommends that you return for lab work, BMET,  Friday, June 17 between 7:30 - 4:30.    If you have labs (blood work) drawn today and your tests are completely normal, you will receive your results only by: [place] (if you have MyChart) OR A paper copy in the mail If you have any lab test that is abnormal or we need to change your treatment, we will call you to review the results.   Testing/Procedures: Your physician has requested that you have an echocardiogram, Friday, July 8 @ 10:10 AM. Echocardiography is a painless test that uses sound waves to create images of your heart. It provides your doctor with information about the size and shape of your heart and how well your heart's chambers and valves are working. This procedure takes approximately one hour. There are no restrictions for this procedure.    Follow-Up: At [facility], you and your health needs are our priority.  As part of our continuing mission to provide you with exceptional heart care, we have created designated Provider Care Teams.  These Care Teams include your primary Cardiologist (physician) and Advanced Practice Providers (APPs -  Physician Assistants and Nurse Practitioners) who all work together to provide you with the care you need, when you need it.  We recommend signing up for the patient portal called "MyChart".  Sign up information is provided on this After Visit Summary.  MyChart is used to connect with patients for Virtual Visits (Telemedicine).  Patients are able to view lab/test results, encounter notes, upcoming appointments, etc.  Non-urgent messages can be sent to your provider as well.   To learn more about what you can do  with MyChart, go to [email or web address].    Your next appointment:   8 week(s) On Friday, August 5 @ 11:15 with Richardson Dopp, PA.  The format for your next appointment:   In Person  Provider:   Richardson Dopp, PA-C   Other Instructions   Please keep and eye on blood pressure take daily, in two weeks send a mychart message to [place] with all your blood pressure readings.

## 2021-03-04 LAB — BASIC METABOLIC PANEL
BUN/Creatinine Ratio: 20 (ref 12–28)
BUN: 21 mg/dL (ref 8–27)
CO2: 23 mmol/L (ref 20–29)
Calcium: 9.2 mg/dL (ref 8.7–10.3)
Chloride: 101 mmol/L (ref 96–106)
Creatinine, Ser: 1.07 mg/dL — ABNORMAL HIGH (ref 0.57–1.00)
Glucose: 86 mg/dL (ref 65–99)
Potassium: 4.9 mmol/L (ref 3.5–5.2)
Sodium: 138 mmol/L (ref 134–144)
eGFR: 55 mL/min/{1.73_m2} — ABNORMAL LOW (ref >59)

## 2021-03-04 LAB — PRO B NATRIURETIC PEPTIDE: NT-Pro BNP: 17 pg/mL (ref 0–301)

## 2021-03-07 ENCOUNTER — Other Ambulatory Visit: Payer: Self-pay | Admitting: *Deleted

## 2021-03-07 ENCOUNTER — Ambulatory Visit (INDEPENDENT_AMBULATORY_CARE_PROVIDER_SITE_OTHER): Payer: Medicare HMO | Admitting: *Deleted

## 2021-03-07 DIAGNOSIS — J309 Allergic rhinitis, unspecified: Secondary | ICD-10-CM

## 2021-03-07 MED ORDER — FUROSEMIDE 20 MG PO TABS: 20.0000 mg | ORAL_TABLET | ORAL | 3 refills | Status: DC

## 2021-03-10 ENCOUNTER — Other Ambulatory Visit: Payer: Self-pay

## 2021-03-10 ENCOUNTER — Other Ambulatory Visit: Payer: Medicare HMO | Admitting: *Deleted

## 2021-03-10 DIAGNOSIS — I2510 Atherosclerotic heart disease of native coronary artery without angina pectoris: Secondary | ICD-10-CM | POA: Diagnosis not present

## 2021-03-10 DIAGNOSIS — I5032 Chronic diastolic (congestive) heart failure: Secondary | ICD-10-CM | POA: Diagnosis not present

## 2021-03-10 DIAGNOSIS — R0602 Shortness of breath: Secondary | ICD-10-CM | POA: Diagnosis not present

## 2021-03-10 DIAGNOSIS — E7800 Pure hypercholesterolemia, unspecified: Secondary | ICD-10-CM | POA: Diagnosis not present

## 2021-03-10 DIAGNOSIS — I10 Essential (primary) hypertension: Secondary | ICD-10-CM | POA: Diagnosis not present

## 2021-03-11 LAB — BASIC METABOLIC PANEL
BUN/Creatinine Ratio: 25 (ref 12–28)
BUN: 26 mg/dL (ref 8–27)
CO2: 23 mmol/L (ref 20–29)
Calcium: 9.8 mg/dL (ref 8.7–10.3)
Chloride: 103 mmol/L (ref 96–106)
Creatinine, Ser: 1.05 mg/dL — ABNORMAL HIGH (ref 0.57–1.00)
Glucose: 102 mg/dL — ABNORMAL HIGH (ref 65–99)
Potassium: 4.4 mmol/L (ref 3.5–5.2)
Sodium: 144 mmol/L (ref 134–144)
eGFR: 56 mL/min/{1.73_m2} — ABNORMAL LOW (ref >59)

## 2021-03-14 ENCOUNTER — Ambulatory Visit (INDEPENDENT_AMBULATORY_CARE_PROVIDER_SITE_OTHER): Payer: Medicare HMO | Admitting: *Deleted

## 2021-03-14 DIAGNOSIS — J309 Allergic rhinitis, unspecified: Secondary | ICD-10-CM

## 2021-03-15 ENCOUNTER — Other Ambulatory Visit: Payer: Self-pay | Admitting: Family Medicine

## 2021-03-15 DIAGNOSIS — J3089 Other allergic rhinitis: Secondary | ICD-10-CM

## 2021-03-21 ENCOUNTER — Ambulatory Visit (INDEPENDENT_AMBULATORY_CARE_PROVIDER_SITE_OTHER): Payer: Medicare HMO | Admitting: *Deleted

## 2021-03-21 ENCOUNTER — Telehealth: Payer: Self-pay | Admitting: *Deleted

## 2021-03-21 DIAGNOSIS — J309 Allergic rhinitis, unspecified: Secondary | ICD-10-CM

## 2021-03-21 NOTE — Telephone Encounter (Signed)
Patient states that on her injections day by the afternoon she notices that she is really tired and usually has to take a nap after her injection. She states that she has been keeping track of it and now notices a pattern that it is only on her injection days that she is really tired. Could this be from her injection?

## 2021-03-21 NOTE — Telephone Encounter (Signed)
Spoke with patient, Informed her of Dr. Jeralyn Ruths recommendation. She stated that she doesn't take her antihistamine every day. She stated that she takes it about 3 times a week. I asked patient if she is getting tired on the other days she is taking her antihistamine. Patient stated she hasn't really noticed getting tired on the other days however she will pay close attention going forward to see. Patient will call back to give [place] an updated next week.

## 2021-03-29 ENCOUNTER — Ambulatory Visit (INDEPENDENT_AMBULATORY_CARE_PROVIDER_SITE_OTHER): Payer: Medicare HMO

## 2021-03-29 DIAGNOSIS — J309 Allergic rhinitis, unspecified: Secondary | ICD-10-CM | POA: Diagnosis not present

## 2021-03-31 ENCOUNTER — Other Ambulatory Visit: Payer: Self-pay

## 2021-03-31 ENCOUNTER — Ambulatory Visit (HOSPITAL_COMMUNITY): Payer: Medicare HMO | Attending: Cardiovascular Disease

## 2021-03-31 DIAGNOSIS — R0602 Shortness of breath: Secondary | ICD-10-CM | POA: Diagnosis not present

## 2021-03-31 DIAGNOSIS — I2510 Atherosclerotic heart disease of native coronary artery without angina pectoris: Secondary | ICD-10-CM

## 2021-03-31 DIAGNOSIS — I10 Essential (primary) hypertension: Secondary | ICD-10-CM | POA: Diagnosis not present

## 2021-03-31 DIAGNOSIS — I5032 Chronic diastolic (congestive) heart failure: Secondary | ICD-10-CM | POA: Diagnosis not present

## 2021-03-31 DIAGNOSIS — E7800 Pure hypercholesterolemia, unspecified: Secondary | ICD-10-CM | POA: Insufficient documentation

## 2021-03-31 LAB — ECHOCARDIOGRAM COMPLETE
Area-P 1/2: 3.31 cm2
S' Lateral: 3.1 cm

## 2021-04-04 ENCOUNTER — Other Ambulatory Visit: Payer: Self-pay

## 2021-04-04 NOTE — Patient Outreach (Signed)
Aging Gracefully Program  04/04/2021  GENEVA BARRERO [birth year] [id number]  [facility] Evaluation Interviewer attempted to call patient on today regarding Aging Gracefully referral. No answer from patient after multiple rings. CMA left confidential voicemail for patient to return call.  Will attempt to call back within 1 week.   [place] Management Assistant [phone]

## 2021-04-05 ENCOUNTER — Telehealth: Payer: Self-pay | Admitting: Physician Assistant

## 2021-04-05 ENCOUNTER — Ambulatory Visit (INDEPENDENT_AMBULATORY_CARE_PROVIDER_SITE_OTHER): Payer: Medicare HMO

## 2021-04-05 DIAGNOSIS — J309 Allergic rhinitis, unspecified: Secondary | ICD-10-CM

## 2021-04-05 NOTE — Telephone Encounter (Signed)
Pt is calling back in regards to several missed called concerning her most recent Echo Results. Please advise pt further

## 2021-04-05 NOTE — Telephone Encounter (Signed)
-----   Message from Liliane Shi, [place] sent at 03/31/2021  2:47 PM EDT ----- Echocardiogram shows normal ejection fraction and no significant valvular abnormalities. PLAN: -Continue current medications and follow up as planned.  -Send copy to PCP Richardson Dopp, PA-C 14:46 03/31/2021

## 2021-04-05 NOTE — Telephone Encounter (Signed)
Returned call to pt and she has been made aware of her echocardiogram results.

## 2021-04-05 NOTE — Progress Notes (Signed)
Pt has been made aware of normal result and verbalized understanding.  jw

## 2021-04-12 ENCOUNTER — Ambulatory Visit (INDEPENDENT_AMBULATORY_CARE_PROVIDER_SITE_OTHER): Payer: Medicare HMO

## 2021-04-12 DIAGNOSIS — J309 Allergic rhinitis, unspecified: Secondary | ICD-10-CM

## 2021-04-13 ENCOUNTER — Other Ambulatory Visit: Payer: Self-pay

## 2021-04-13 NOTE — Patient Outreach (Signed)
Aging Gracefully Program  04/13/2021  Anne Barnes 1946/03/12 [id number]  [facility] Evaluation Interviewer made contact with patient. Patient states she currently has COVID and apologized for not returning [place] call.  Aging Gracefully final survey scheduled for 04/14/21 at 11:00 a.m. per patient.   [place] Management Assistant  [phone]

## 2021-04-14 ENCOUNTER — Other Ambulatory Visit: Payer: Self-pay

## 2021-04-14 ENCOUNTER — Telehealth: Payer: Self-pay

## 2021-04-14 NOTE — Telephone Encounter (Signed)
Pt tested positive on 07/202/22   Patient states she has been having respiratory issues , postnasal drainage and fatigue.

## 2021-04-14 NOTE — Patient Outreach (Signed)
Aging Gracefully Program  04/14/2021  Anne Barnes [birth year] [id number]  [facility] Evaluation Interviewer made contact with patient. Aging Gracefully final survey completed.   Interviewer will send referral to Sharol Given at [facility] for follow up.   [place] Management Assistant  [phone]

## 2021-04-14 NOTE — Telephone Encounter (Signed)
Pt called and notified

## 2021-04-14 NOTE — Progress Notes (Signed)
Subjective:   Anne Barnes is a 75 y.o. female who presents for Medicare Annual (Subsequent) preventive examination.  I connected with Kerstan today by telephone and verified that I am speaking with the correct person using two identifiers. Location patient: home Location provider: work Persons participating in the virtual visit: patient, Marine scientist.    I discussed the limitations, risks, security and privacy concerns of performing an evaluation and management service by telephone and the availability of in person appointments. I also discussed with the patient that there may be a patient responsible charge related to this service. The patient expressed understanding and verbally consented to this telephonic visit.    Interactive audio and video telecommunications were attempted between this provider and patient, however failed, due to patient having technical difficulties OR patient did not have access to video capability.  We continued and completed visit with audio only.  Some vital signs may be absent or patient reported.   Time Spent with patient on telephone encounter: 20 minutes   Review of Systems     Cardiac Risk Factors include: advanced age (>25mn, >>69women);dyslipidemia;hypertension;obesity (BMI >30kg/m2)     Objective:    Today's Vitals   04/17/21 1341  Weight: 226 lb (102.5 kg)  Height: '4\' 11"'$  (1.499 m)   Body mass index is 45.65 kg/m.  Advanced Directives 04/17/2021 05/24/2020 04/04/2020 01/13/2020 11/24/2019 04/02/2019 03/31/2018  Does Patient Have a Medical Advance Directive? No No No No No No Yes  Type of Advance Directive - - - - - - HPress photographerLiving will  Does patient want to make changes to medical advance directive? - - No - Patient declined - - - No - Patient declined  Copy of [place]ory [place]ersin Chart? - - - - - - No - copy requested  Would patient like information on creating a medical advance directive? Yes  (MAU/Ambulatory/Procedural Areas - Information given) - - - - Yes (MAU/Ambulatory/Procedural Areas - Information given) -    Current Medications (verified) Outpatient Encounter Medications as of 04/17/2021  Medication Sig   albuterol (VENTOLIN HFA) 108 (90 Base) MCG/ACT inhaler INHALE 2 PUFFS INTO THE LUNGS EVERY 6 HOURS AS NEEDED FOR WHEEZING FOR SHORTNESS OF BREATH   ALPRAZolam (XANAX) 0.5 MG tablet TAKE 1 TABLET BY MOUTH TWICE DAILY AS NEEDED FOR ANXIETY AND FOR SLEEP   amLODipine (NORVASC) 2.5 MG tablet Take 1 tablet by mouth twice daily   aspirin EC 81 MG tablet Take 81 mg by mouth daily. Swallow whole.   Azelastine HCl 137 MCG/SPRAY SOLN USE 2 SPRAY(S) IN EACH NOSTRIL TWICE DAILY   b complex vitamins tablet Take 1 tablet by mouth daily.   Cholecalciferol (VITAMIN D) 2000 UNITS CAPS Take by mouth daily.   EPINEPHrine 0.3 mg/0.3 mL IJ SOAJ injection Inject 0.3 mg into the muscle as needed for anaphylaxis.   fluticasone (FLONASE) 50 MCG/ACT nasal spray Place 2 sprays into both nostrils daily.   furosemide (LASIX) 20 MG tablet Take 1 tablet (20 mg total) by mouth every other day.   levocetirizine (XYZAL) 5 MG tablet TAKE 1 TABLET BY MOUTH ONCE DAILY IN THE EVENING   losartan (COZAAR) 100 MG tablet Take 1 tablet (100 mg total) by mouth daily.   montelukast (SINGULAIR) 10 MG tablet TAKE 1 TABLET BY MOUTH AT BEDTIME   Multiple Vitamin (MULTIVITAMIN) tablet Take 1 tablet by mouth daily.   olopatadine (PATANOL) 0.1 % ophthalmic solution Place 1 drop into both eyes 2 (two) times  daily.   omeprazole (PRILOSEC) 20 MG capsule Take 1 capsule by mouth once daily   rosuvastatin (CRESTOR) 10 MG tablet Take 1 tablet by mouth once daily   spironolactone (ALDACTONE) 25 MG tablet Take 1 tablet (25 mg total) by mouth daily.   tiZANidine (ZANAFLEX) 2 MG tablet TAKE 1/2 TO 2 TABLETS BY MOUTH TWICE DAILY AS NEEDED FOR MUSCLE SPASM   VITAMIN D, CHOLECALCIFEROL, PO Take by mouth.   [DISCONTINUED] MODERNA  COVID-19 VACCINE 100 MCG/0.5ML injection    No facility-administered encounter medications on file as of 04/17/2021.    Allergies (verified) Codeine, Eggs or egg-derived products, Hydralazine hcl, Lisinopril-hydrochlorothiazide, Metoprolol, Other, Shrimp [shellfish allergy], and Tomato   History: Past Medical History:  Diagnosis Date   Acute combined systolic and diastolic heart failure ([place]) 05/26/2015   ALLERGIC RHINITIS    Allergic urticaria 08/01/2014   Patient reports allergies to shrimp, egg whites, tomatoes, dairy    Allergy    seasonal and numerous food and drug allergies.   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   Office Spirometry 11/07/17-WNL-FVC 1.58/82%, FEV1 1.27/85%, ratio 0.80, FEF 25-75% 0.26/89%   Bradycardia 02/25/2017   Chiari malformation    Noted MRI brain 09/2013 - s/p neuro eval for same   DIVERTICULOSIS, COLON    Essential hypertension 08/28/2010   Qualifier: Diagnosis of  By: Marca Ancona RMA, Lucy      Frequent headaches    GERD    H/O measles    H/O mumps    Hearing loss 01/18/2017   History of chicken pox    Hyperglycemia 03/05/2016   HYPERTENSION    Hypocalcemia 02/25/2017   IBS (irritable bowel syndrome) 05/26/2015   Nonspecific abnormal electrocardiogram (ECG) (EKG)    OBSTRUCTIVE SLEEP APNEA 12/2008 dx   noncompliant with CPAP qhs   Obstructive sleep apnea 08/28/2010   NPSG 12/2008:  AHI 13/hr with desats to 78%    Preventative health care 06/11/2017   Sciatica    right side   Seasonal and perennial allergic rhinitis 08/28/2010   Allergy profile 03/11/14- positive especially for dust, cat and dog Food allergy profile- total IgE 86 with several food group elevations Sed rate-WNL      Sinusitis 06/06/2017   Sleep apnea    uses cpap   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   Vaginitis 01/18/2017   Vitamin D deficiency 06/11/2017   Past Surgical History:  Procedure Laterality Date   [place]   Breast biopsy   COLONOSCOPY  2011   Dr  Collene Mares   UMBILICAL HERNIA REPAIR     Family History  Problem Relation Age of Onset   Arthritis Mother        died of complications from hip surgery   Arthritis Father    Kidney disease Father    Pneumonia Father    Heart disease Father    Lupus Daughter    Rheum arthritis Daughter    Sjogren's syndrome Daughter    Fibromyalgia Daughter    Diabetes Brother    Heart disease Brother    Heart attack Brother    Alcohol abuse Brother    Throat cancer Brother    Lung cancer Brother    Kidney disease Sister        dialysis   Obesity Sister    Hypertension Maternal Grandfather    Hypertension Sister    Kidney disease Sister        dialysis   Pneumonia Brother  Hypertension Daughter    Berenice Primas' disease Daughter    Hypertension Daughter    Alcohol abuse Other        parent   Ataxia Neg Hx    Chorea Neg Hx    Dementia Neg Hx    Mental retardation Neg Hx    Migraines Neg Hx    Multiple sclerosis Neg Hx    Neurofibromatosis Neg Hx    Neuropathy Neg Hx    Parkinsonism Neg Hx    Seizures Neg Hx    Stroke Neg Hx    Colon cancer Neg Hx    Colon polyps Neg Hx    Esophageal cancer Neg Hx    Rectal cancer Neg Hx    Stomach cancer Neg Hx    Social History   Socioeconomic History   Marital status: Widowed    Spouse name: Not on file   Number of children: Not on file   Years of education: Not on file   Highest education level: Not on file  Occupational History   Occupation: retired  Tobacco Use   Smoking status: Never   Smokeless tobacco: Never   Tobacco comments:    Married, lives with spouse, dtr 2 g-kids.  Vaping Use   Vaping Use: Never used  Substance and Sexual Activity   Alcohol use: No    Comment: rare   Drug use: No   Sexual activity: Not Currently    Comment: lives with husband, no dietary restrictions avoid foods allergic to, retired  Other Topics Concern   Not on file  Social History Narrative   Right handed   One story with daughter and two grandkids    Social Determinants of Health   Financial Resource Strain: Low Risk    Difficulty of Paying Living Expenses: Not hard at all  Food Insecurity: No Food Insecurity   Worried About Charity fundraiser in the Last Year: Never true   Arboriculturist in the Last Year: Never true  Transportation Needs: No Transportation Needs   Lack of Transportation (Medical): No   Lack of Transportation (Non-Medical): No  Physical Activity: Sufficiently Active   Days of Exercise per Week: 7 days   Minutes of Exercise per Session: 40 min  Stress: No Stress Concern Present   Feeling of Stress : Not at all  Social Connections: Moderately Isolated   Frequency of Communication with Friends and Family: More than three times a week   Frequency of Social Gatherings with Friends and Family: More than three times a week   Attends Religious Services: More than 4 times per year   Active Member of [employer] or Organizations: No   Attends Archivist Meetings: Never   Marital Status: Widowed    Tobacco Counseling Counseling given: Not Answered Tobacco comments: Married, lives with spouse, dtr 2 g-kids.   Clinical Intake:  Pre-visit preparation completed: Yes  Pain : No/denies pain     Nutritional Status: BMI > 30  Obese Nutritional Risks: None Diabetes: No  How often do you need to have someone help you when you read instructions, pamphlets, or other written materials from your doctor or pharmacy?: 1 - Never  Diabetic?No  Interpreter Needed?: No  Information entered by :: Caroleen Hamman LPN   Activities of Daily Living In your present state of health, do you have any difficulty performing the following activities: 04/17/2021  Hearing? N  Vision? N  Difficulty concentrating or making decisions? N  Walking or climbing stairs?  N  Dressing or bathing? N  Doing errands, shopping? N  Preparing Food and eating ? N  Using the Toilet? N  In the past six months, have you accidently leaked  urine? Y  Do you have problems with loss of bowel control? N  Managing your Medications? N  Managing your Finances? N  Housekeeping or managing your Housekeeping? N  Some recent data might be hidden    Patient Care Team: Mosie Lukes, MD as PCP - General (Family Medicine) Freada Bergeron, MD as PCP - Cardiology (Cardiology) Juanita Craver, MD (Gastroenterology) Deneise Lever, MD as Consulting Physician (Pulmonary Disease) Randon Goldsmith, OT as Occupational Therapist (Occupational Therapy)  Indicate any recent Medical Services you may have received from other than [facility] providers in the past year (date may be approximate).     Assessment:   This is a routine wellness examination for Aset.  Hearing/Vision screen Hearing Screening - Comments:: C/o mild hearing loss Vision Screening - Comments:: Last eye exam-02/2020-[facility]  Dietary issues and exercise activities discussed: Current Exercise Habits: Home exercise routine, Type of exercise: strength training/weights;stretching;walking, Time (Minutes): 40, Frequency (Times/Week): 7, Weekly Exercise (Minutes/Week): 280, Intensity: Mild, Exercise limited by: None identified   Goals Addressed   None    Depression Screen PHQ 2/9 Scores 04/17/2021 04/14/2021 12/07/2020 10/20/2020 05/10/2020 04/13/2020 04/04/2020  PHQ - 2 Score '1 1 2 '$ 0 1 0 0  PHQ- 9 Score - - 3 2 - - -    Fall Risk Fall Risk  04/17/2021 10/20/2020 05/24/2020 05/02/2020 04/04/2020  Falls in the past year? 0 0 0 0 0  Number falls in past yr: 0 - 0 0 0  Injury with Fall? 0 - 0 0 0  Risk for fall due to : - - - No Fall Risks -  Follow up Falls prevention discussed - - Falls evaluation completed Education provided;Falls prevention discussed    FALL RISK PREVENTION PERTAINING TO THE HOME:  Any stairs in or around the home? Yes  If so, are there any without handrails? No  Home free of loose throw rugs in walkways, pet beds, electrical cords, etc? Yes   Adequate lighting in your home to reduce risk of falls? Yes   ASSISTIVE DEVICES UTILIZED TO PREVENT FALLS:  Life alert? No  Use of a cane, walker or w/c? Yes occasionally Grab bars in the bathroom? Yes  Shower chair or bench in shower? No  Elevated toilet seat or a handicapped toilet? No   TIMED UP AND GO:  Was the test performed? No . Phone visit   Cognitive Function:Normal cognitive status assessed by  this Nurse Health Advisor. No abnormalities found.          Immunizations Immunization History  Administered Date(s) Administered   Influenza Inj Mdck Quad Pf 10/20/2020   Influenza, High Dose Seasonal PF 07/15/2013, 06/06/2018   Influenza,inj,Quad PF,6+ Mos 08/29/2015   Moderna Sars-Covid-2 Vaccination 11/26/2019, 12/24/2019, 10/06/2020   Pneumococcal Conjugate-13 11/30/2015   Pneumococcal Polysaccharide-23 02/26/2013   Td 09/25/2003   Tdap 08/29/2015    TDAP status: Up to date  Flu Vaccine status: Up to date  Pneumococcal vaccine status: Up to date  Covid-19 vaccine status: Completed vaccines  Qualifies for Shingles Vaccine? Yes   Zostavax completed No   Shingrix Completed?: No.    Education has been provided regarding the importance of this vaccine. Patient has been advised to call insurance company to determine out of pocket expense if they have not  yet received this vaccine. Advised may also receive vaccine at local pharmacy or Health Dept. Verbalized acceptance and understanding.  Screening Tests Health Maintenance  Topic Date Due   Zoster Vaccines- Shingrix (1 of 2) Never done   MAMMOGRAM  11/21/2016   COVID-19 Vaccine (4 - Booster for Moderna series) 02/03/2021   INFLUENZA VACCINE  04/24/2021   TETANUS/TDAP  08/28/2025   COLONOSCOPY (Pts 45-80yr Insurance coverage will need to be confirmed)  12/22/2027   DEXA SCAN  Completed   Hepatitis C Screening  Completed   PNA vac Low Risk Adult  Completed   HPV VACCINES  Aged Out    Health  Maintenance  Health Maintenance Due  Topic Date Due   Zoster Vaccines- Shingrix (1 of 2) Never done   MAMMOGRAM  11/21/2016   COVID-19 Vaccine (4 - Booster for Moderna series) 02/03/2021    Colorectal cancer screening: Type of screening: Colonoscopy. Completed 01/16/2021. Repeat every 7 years per patient  Mammogram status: Declined  Bone Density status: Ordered today. Pt provided with contact info and advised to call to schedule appt.  Lung Cancer Screening: (Low Dose CT Chest recommended if Age 74-80years, 30 pack-year currently smoking OR have quit w/in 15years.) does not qualify.     Additional Screening:  Hepatitis C Screening: Completed 11/22/2015  Vision Screening: Recommended annual ophthalmology exams for early detection of glaucoma and other disorders of the eye. Is the patient up to date with their annual eye exam?  No  Who is the provider or what is the name of the office in which the patient attends annual eye exams? [facility]   Dental Screening: Recommended annual dental exams for proper oral hygiene  Community Resource Referral / Chronic Care Management: CRR required this visit?  No   CCM required this visit?  No      Plan:     I have personally reviewed and noted the following in the patient's chart:   Medical and social history Use of alcohol, tobacco or illicit drugs  Current medications and supplements including opioid prescriptions.  Functional ability and status Nutritional status Physical activity Advanced directives List of other physicians Hospitalizations, surgeries, and ER visits in previous 12 months Vitals Screenings to include cognitive, depression, and falls Referrals and appointments  In addition, I have reviewed and discussed with patient certain preventive protocols, quality metrics, and best practice recommendations. A written personalized care plan for preventive services as well as general preventive health recommendations  were provided to patient.   Due to this being a telephonic visit, the after visit summary with patients personalized plan was offered to patient via mail or my-chart.  Patient would like to access on my-chart.   MMarta Antu LPN   [id number] Nurse Health Advisor  Nurse Notes: None

## 2021-04-17 ENCOUNTER — Ambulatory Visit (INDEPENDENT_AMBULATORY_CARE_PROVIDER_SITE_OTHER): Payer: Medicare HMO

## 2021-04-17 VITALS — Ht 59.0 in | Wt 226.0 lb

## 2021-04-17 DIAGNOSIS — Z780 Asymptomatic menopausal state: Secondary | ICD-10-CM

## 2021-04-17 DIAGNOSIS — Z0000 Encounter for general adult medical examination without abnormal findings: Secondary | ICD-10-CM | POA: Diagnosis not present

## 2021-04-17 NOTE — Patient Instructions (Signed)
[name] , Thank you for taking time to complete your Medicare Wellness Visit. I appreciate your ongoing commitment to your health goals. Please review the following plan we discussed and let me know if I can assist you in the future.   Screening recommendations/referrals: Colonoscopy: Follow GI recommendations Mammogram: Declined today. Please call the office to schedule if you change your mind. Bone Density: Ordered today. Someone will be calling you to schedule. Recommended yearly ophthalmology/optometry visit for glaucoma screening and checkup Recommended yearly dental visit for hygiene and checkup  Vaccinations: Influenza vaccine: Up to date Pneumococcal vaccine: Up to date Tdap vaccine: Up to date-Due-08/28/2025 Shingles vaccine: Declined   Covid-19:Up to date  Advanced directives: Information mailed today  Conditions/risks identified: See problem list  Next appointment: Follow up in one year for your annual wellness visit 04/19/2022 @ 11:00   Preventive Care 65 Years and Older, Female Preventive care refers to lifestyle choices and visits with your health care provider that can promote health and wellness. What does preventive care include? A yearly physical exam. This is also called an annual well check. Dental exams once or twice a year. Routine eye exams. Ask your health care provider how often you should have your eyes checked. Personal lifestyle choices, including: Daily care of your teeth and gums. Regular physical activity. Eating a healthy diet. Avoiding tobacco and drug use. Limiting alcohol use. Practicing safe sex. Taking low-dose aspirin every day. Taking vitamin and mineral supplements as recommended by your health care provider. What happens during an annual well check? The services and screenings done by your health care provider during your annual well check will depend on your age, overall health, lifestyle risk factors, and family history of  disease. Counseling  Your health care provider may ask you questions about your: Alcohol use. Tobacco use. Drug use. Emotional well-being. Home and relationship well-being. Sexual activity. Eating habits. History of falls. Memory and ability to understand (cognition). Work and work Statistician. Reproductive health. Screening  You may have the following tests or measurements: Height, weight, and BMI. Blood pressure. Lipid and cholesterol levels. These may be checked every 5 years, or more frequently if you are over 24 years old. Skin check. Lung cancer screening. You may have this screening every year starting at age 54 if you have a 30-pack-year history of smoking and currently smoke or have quit within the past 15 years. Fecal occult blood test (FOBT) of the stool. You may have this test every year starting at age 48. Flexible sigmoidoscopy or colonoscopy. You may have a sigmoidoscopy every 5 years or a colonoscopy every 10 years starting at age 39. Hepatitis C blood test. Hepatitis B blood test. Sexually transmitted disease (STD) testing. Diabetes screening. This is done by checking your blood sugar (glucose) after you have not eaten for a while (fasting). You may have this done every 1-3 years. Bone density scan. This is done to screen for osteoporosis. You may have this done starting at age 48. Mammogram. This may be done every 1-2 years. Talk to your health care provider about how often you should have regular mammograms. Talk with your health care provider about your test results, treatment options, and if necessary, the need for more tests. Vaccines  Your health care provider may recommend certain vaccines, such as: Influenza vaccine. This is recommended every year. Tetanus, diphtheria, and acellular pertussis (Tdap, Td) vaccine. You may need a Td booster every 10 years. Zoster vaccine. You may need this after age 79.  Pneumococcal 13-valent conjugate (PCV13) vaccine. One  dose is recommended after age 44. Pneumococcal polysaccharide (PPSV23) vaccine. One dose is recommended after age 43. Talk to your health care provider about which screenings and vaccines you need and how often you need them. This information is not intended to replace advice given to you by your health care provider. Make sure you discuss any questions you have with your health care provider. Document Released: 10/07/2015 Document Revised: 05/30/2016 Document Reviewed: 07/12/2015 Elsevier Interactive Patient Education  2017 [place] Prevention in the Home Falls can cause injuries. They can happen to people of all ages. There are many things you can do to make your home safe and to help prevent falls. What can I do on the outside of my home? Regularly fix the edges of walkways and driveways and fix any cracks. Remove anything that might make you trip as you walk through a door, such as a raised step or threshold. Trim any bushes or trees on the path to your home. Use bright outdoor lighting. Clear any walking paths of anything that might make someone trip, such as rocks or tools. Regularly check to see if handrails are loose or broken. Make sure that both sides of any steps have handrails. Any raised decks and porches should have guardrails on the edges. Have any leaves, snow, or ice cleared regularly. Use sand or salt on walking paths during winter. Clean up any spills in your garage right away. This includes oil or grease spills. What can I do in the bathroom? Use night lights. Install grab bars by the toilet and in the tub and shower. Do not use towel bars as grab bars. Use non-skid mats or decals in the tub or shower. If you need to sit down in the shower, use a plastic, non-slip stool. Keep the floor dry. Clean up any water that spills on the floor as soon as it happens. Remove soap buildup in the tub or shower regularly. Attach bath mats securely with double-sided  non-slip rug tape. Do not have throw rugs and other things on the floor that can make you trip. What can I do in the bedroom? Use night lights. Make sure that you have a light by your bed that is easy to reach. Do not use any sheets or blankets that are too big for your bed. They should not hang down onto the floor. Have a firm chair that has side arms. You can use this for support while you get dressed. Do not have throw rugs and other things on the floor that can make you trip. What can I do in the kitchen? Clean up any spills right away. Avoid walking on wet floors. Keep items that you use a lot in easy-to-reach places. If you need to reach something above you, use a strong step stool that has a grab bar. Keep electrical cords out of the way. Do not use floor polish or wax that makes floors slippery. If you must use wax, use non-skid floor wax. Do not have throw rugs and other things on the floor that can make you trip. What can I do with my stairs? Do not leave any items on the stairs. Make sure that there are handrails on both sides of the stairs and use them. Fix handrails that are broken or loose. Make sure that handrails are as long as the stairways. Check any carpeting to make sure that it is firmly attached to the stairs. Fix any  carpet that is loose or worn. Avoid having throw rugs at the top or bottom of the stairs. If you do have throw rugs, attach them to the floor with carpet tape. Make sure that you have a light switch at the top of the stairs and the bottom of the stairs. If you do not have them, ask someone to add them for you. What else can I do to help prevent falls? Wear shoes that: Do not have high heels. Have rubber bottoms. Are comfortable and fit you well. Are closed at the toe. Do not wear sandals. If you use a stepladder: Make sure that it is fully opened. Do not climb a closed stepladder. Make sure that both sides of the stepladder are locked into place. Ask  someone to hold it for you, if possible. Clearly mark and make sure that you can see: Any grab bars or handrails. First and last steps. Where the edge of each step is. Use tools that help you move around (mobility aids) if they are needed. These include: Canes. Walkers. Scooters. Crutches. Turn on the lights when you go into a dark area. Replace any light bulbs as soon as they burn out. Set up your furniture so you have a clear path. Avoid moving your furniture around. If any of your floors are uneven, fix them. If there are any pets around you, be aware of where they are. Review your medicines with your doctor. Some medicines can make you feel dizzy. This can increase your chance of falling. Ask your doctor what other things that you can do to help prevent falls. This information is not intended to replace advice given to you by your health care provider. Make sure you discuss any questions you have with your health care provider. Document Released: 07/07/2009 Document Revised: 02/16/2016 Document Reviewed: 10/15/2014 Elsevier Interactive Patient Education  2017 [employer].

## 2021-04-20 ENCOUNTER — Other Ambulatory Visit: Payer: Self-pay

## 2021-04-20 ENCOUNTER — Encounter: Payer: Self-pay | Admitting: Family Medicine

## 2021-04-20 ENCOUNTER — Ambulatory Visit (INDEPENDENT_AMBULATORY_CARE_PROVIDER_SITE_OTHER): Payer: Medicare HMO | Admitting: Family Medicine

## 2021-04-20 VITALS — BP 122/74 | HR 79 | Temp 98.6°F | Resp 16 | Wt 224.6 lb

## 2021-04-20 DIAGNOSIS — E7800 Pure hypercholesterolemia, unspecified: Secondary | ICD-10-CM | POA: Diagnosis not present

## 2021-04-20 DIAGNOSIS — U071 COVID-19: Secondary | ICD-10-CM | POA: Insufficient documentation

## 2021-04-20 DIAGNOSIS — I10 Essential (primary) hypertension: Secondary | ICD-10-CM | POA: Diagnosis not present

## 2021-04-20 DIAGNOSIS — E559 Vitamin D deficiency, unspecified: Secondary | ICD-10-CM

## 2021-04-20 DIAGNOSIS — R739 Hyperglycemia, unspecified: Secondary | ICD-10-CM | POA: Diagnosis not present

## 2021-04-20 LAB — CBC
HCT: 38.3 % (ref 36.0–46.0)
Hemoglobin: 12.5 g/dL (ref 12.0–15.0)
MCHC: 32.7 g/dL (ref 30.0–36.0)
MCV: 91.2 fl (ref 78.0–100.0)
Platelets: 231 10*3/uL (ref 150.0–400.0)
RBC: 4.19 Mil/uL (ref 3.87–5.11)
RDW: 13.5 % (ref 11.5–15.5)
WBC: 7.4 10*3/uL (ref 4.0–10.5)

## 2021-04-20 LAB — HEMOGLOBIN A1C: Hgb A1c MFr Bld: 5.6 % (ref 4.6–6.5)

## 2021-04-20 LAB — TSH: TSH: 1.01 u[IU]/mL (ref 0.35–5.50)

## 2021-04-20 LAB — COMPREHENSIVE METABOLIC PANEL
ALT: 43 U/L — ABNORMAL HIGH (ref 0–35)
AST: 29 U/L (ref 0–37)
Albumin: 3.9 g/dL (ref 3.5–5.2)
Alkaline Phosphatase: 76 U/L (ref 39–117)
BUN: 18 mg/dL (ref 6–23)
CO2: 28 mEq/L (ref 19–32)
Calcium: 8.7 mg/dL (ref 8.4–10.5)
Chloride: 104 mEq/L (ref 96–112)
Creatinine, Ser: 0.93 mg/dL (ref 0.40–1.20)
GFR: 60.93 mL/min (ref >60.00)
Glucose, Bld: 109 mg/dL — ABNORMAL HIGH (ref 70–99)
Potassium: 3.9 mEq/L (ref 3.5–5.1)
Sodium: 141 mEq/L (ref 135–145)
Total Bilirubin: 0.5 mg/dL (ref 0.2–1.2)
Total Protein: 6.8 g/dL (ref 6.0–8.3)

## 2021-04-20 LAB — LIPID PANEL
Cholesterol: 121 mg/dL (ref 0–200)
HDL: 33.9 mg/dL — ABNORMAL LOW (ref >39.00)
LDL Cholesterol: 75 mg/dL (ref 0–99)
NonHDL: 87.28
Total CHOL/HDL Ratio: 4
Triglycerides: 61 mg/dL (ref 0.0–149.0)
VLDL: 12.2 mg/dL (ref 0.0–40.0)

## 2021-04-20 LAB — VITAMIN D 25 HYDROXY (VIT D DEFICIENCY, FRACTURES): VITD: 43.35 ng/mL (ref 30.00–100.00)

## 2021-04-20 MED ORDER — PROMETHAZINE-DM 6.25-15 MG/5ML PO SYRP: 2.5000 mL | ORAL_SOLUTION | Freq: Three times a day (TID) | ORAL | 1 refills | Status: DC | PRN

## 2021-04-20 NOTE — Patient Instructions (Addendum)
Normal BP is 100-140/60-90   Pulse oximeter, want oxygen in 90s   Take Multivitamin with minerals, selenium Vitamin D 1000-2000 IU daily Probiotic with lactobacillus and bifidophilus Asprin EC 81 mg daily Fish or flaxseed oil or krill oil caps daily  Melatonin 2-5 mg at bedtime     Paxlovid ior Molnupiravir the new COVID medicationsSymptoms of COVID-19 Watch for symptoms People with COVID-19 have had a wide range of symptoms reported - ranging from mild symptoms to severe illness. Symptoms may appear 2-14 days after exposure to the virus. Anyone can have mild to severe symptoms. People with these symptoms may have COVID-19: Fever or chills Cough Shortness of breath or difficulty breathing Fatigue Muscle or body aches Headache New loss of taste or smell Sore throat Congestion or runny nose Nausea or vomiting Diarrhea This list does not include all possible symptoms. CDC will continue to update this list as we learn more about COVID-19. Older adults and people who have severe underlying medical conditions like heart or lung disease or diabetes seem to be at higher risk for developing more serious complications 0000000 illness. When to seek emergency medical attention Look for emergency warning signs* for COVID-19. If someone is showing any of these signs, seek emergency medical care immediately: Trouble breathing Persistent pain or pressure in the chest New confusion Inability to wake or stay awake Pale, gray, or blue-colored skin, lips, or nail beds, depending on skin tone *This list is not all possible symptoms. Please call your medical provider forany other symptoms that are severe or concerning to you.  Call 911 or call ahead to your local emergency facility: Notify the operatorthat you are seeking care for someone who has or may have COVID-19. If you are sick Check symptoms with a Coronavirus Self-Checker Get tested What to do if you are sick Isolate if you are  sick When to quarantine How to care for someone who is sick Difference between COVID-19 & flu Influenza (Flu) and COVID-19 are both contagious respiratory illnesses, but they are caused by different viruses. COVID-19 is caused by infection with a new coronavirus (called SARS-CoV-2), and flu is caused by infection with influenza viruses.  COVID-19 seems to spread more easily than flu and causes more serious illnesses in some people. It can also take longer before people show symptoms and people can be contagious for longer. More information about differences between fluand COVID-19 is available in the different sections below.  Because some of the symptoms of flu and COVID-19 are similar, it may be hard to tell the difference between them based on symptoms alone, and testingmay be needed to help confirm a diagnosis. While more is learned every day about COVID-19 and the virus that causes it, there is still a lot that is unknown . This page compares COVID-19 and flu,given the best available information to date. Centers for Disease Control and Prevention Content source: [employer] ([employer]), Division of Viral Diseases 11/16/2019 This information is not intended to replace advice given to you by your health care provider. Make sure you discuss any questions you have with your healthcare provider. Document Revised: 10/28/2020 Document Reviewed: 10/28/2020 Elsevier Patient Education  2022 [employer].  we can give you if you get COVID so make sure you test if you have symptoms because we have to treat by day 5 of symptoms for it to be effective. If you are positive let us know so we can treat. If a home test is  negative and your symptoms are persistent get a PCR test. Can check testing locations at [email or web address] If you are positive we will make an appointment with [place] and we will send in Paxlovid if you would like it. Check with your pharmacy before we meet to  confirm they have it in stock, if they do not then we can get the prescription at the [facility]

## 2021-04-20 NOTE — Assessment & Plan Note (Addendum)
She tested positive a week ago today on Thursday. Had symptoms probably 3 days before. Noted a Tmax of 103. Cough is productive of clear phlegm. Cough is worse at night. No gi symptoms. Has a slight headache many of these c/w her normal allergies. Notes fatigue but mild. No myalgias. Given Promethazine DM syrup and seek care as needed and if oxygen levels drop below 90%

## 2021-04-23 NOTE — Assessment & Plan Note (Signed)
Encourage heart healthy diet such as MIND or DASH diet, increase exercise, avoid trans fats, simple carbohydrates and processed foods, consider a krill or fish or flaxseed oil cap daily.  Tolerating Rosuvastatin 

## 2021-04-23 NOTE — Assessment & Plan Note (Signed)
Supplement and monitor 

## 2021-04-23 NOTE — Assessment & Plan Note (Signed)
Well controlled, no changes to meds. Encouraged heart healthy diet such as the DASH diet and exercise as tolerated.  °

## 2021-04-23 NOTE — Assessment & Plan Note (Signed)
hgba1c acceptable, minimize simple carbs. Increase exercise as tolerated.  

## 2021-04-23 NOTE — Progress Notes (Signed)
Subjective:    Patient ID: Anne Barnes, female    DOB: 1946/04/05, 75 y.o.   MRN: [id number]  Chief Complaint  Patient presents with   Follow-up   Hypertension    HPI Patient is in today for follow up on chronic medical concerns. She was diagnosed with COVID last week and is improving. She had a Tmax of 103 but that has resolved. Her cough is still notable but improving some, largely productive of clear fluids. She has noted fatigue, malaise and congestion. Some headache as well. Denies CP/palp/SOB/GI or GU c/o. Taking meds as prescribed   Past Medical History:  Diagnosis Date   Acute combined systolic and diastolic heart failure ([place]) 05/26/2015   ALLERGIC RHINITIS    Allergic urticaria 08/01/2014   Patient reports allergies to shrimp, egg whites, tomatoes, dairy    Allergy    seasonal and numerous food and drug allergies.   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   Office Spirometry 11/07/17-WNL-FVC 1.58/82%, FEV1 1.27/85%, ratio 0.80, FEF 25-75% 0.26/89%   Bradycardia 02/25/2017   Chiari malformation    Noted MRI brain 09/2013 - s/p neuro eval for same   DIVERTICULOSIS, COLON    Essential hypertension 08/28/2010   Qualifier: Diagnosis of  By: Marca Ancona RMA, Lucy      Frequent headaches    GERD    H/O measles    H/O mumps    Hearing loss 01/18/2017   History of chicken pox    Hyperglycemia 03/05/2016   HYPERTENSION    Hypocalcemia 02/25/2017   IBS (irritable bowel syndrome) 05/26/2015   Nonspecific abnormal electrocardiogram (ECG) (EKG)    OBSTRUCTIVE SLEEP APNEA 12/2008 dx   noncompliant with CPAP qhs   Obstructive sleep apnea 08/28/2010   NPSG 12/2008:  AHI 13/hr with desats to 78%    Preventative health care 06/11/2017   Sciatica    right side   Seasonal and perennial allergic rhinitis 08/28/2010   Allergy profile 03/11/14- positive especially for dust, cat and dog Food allergy profile- total IgE 86 with several food group elevations Sed rate-WNL      Sinusitis 06/06/2017   Sleep apnea     uses cpap   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   Vaginitis 01/18/2017   Vitamin D deficiency 06/11/2017    Past Surgical History:  Procedure Laterality Date   [place]   Breast biopsy   COLONOSCOPY  2011   Dr Collene Mares   UMBILICAL HERNIA REPAIR      Family History  Problem Relation Age of Onset   Arthritis Mother        died of complications from hip surgery   Arthritis Father    Kidney disease Father    Pneumonia Father    Heart disease Father    Lupus Daughter    Rheum arthritis Daughter    Sjogren's syndrome Daughter    Fibromyalgia Daughter    Diabetes Brother    Heart disease Brother    Heart attack Brother    Alcohol abuse Brother    Throat cancer Brother    Lung cancer Brother    Kidney disease Sister        dialysis   Obesity Sister    Hypertension Maternal Grandfather    Hypertension Sister    Kidney disease Sister        dialysis   Pneumonia Brother    Hypertension Daughter    Berenice Primas' disease Daughter    Hypertension  Daughter    Alcohol abuse Other        parent   Ataxia Neg Hx    Chorea Neg Hx    Dementia Neg Hx    Mental retardation Neg Hx    Migraines Neg Hx    Multiple sclerosis Neg Hx    Neurofibromatosis Neg Hx    Neuropathy Neg Hx    Parkinsonism Neg Hx    Seizures Neg Hx    Stroke Neg Hx    Colon cancer Neg Hx    Colon polyps Neg Hx    Esophageal cancer Neg Hx    Rectal cancer Neg Hx    Stomach cancer Neg Hx     Social History   Socioeconomic History   Marital status: Widowed    Spouse name: Not on file   Number of children: Not on file   Years of education: Not on file   Highest education level: Not on file  Occupational History   Occupation: retired  Tobacco Use   Smoking status: Never   Smokeless tobacco: Never   Tobacco comments:    Married, lives with spouse, dtr 2 g-kids.  Vaping Use   Vaping Use: Never used  Substance and Sexual Activity   Alcohol use: No    Comment: rare    Drug use: No   Sexual activity: Not Currently    Comment: lives with husband, no dietary restrictions avoid foods allergic to, retired  Other Topics Concern   Not on file  Social History Narrative   Right handed   One story with daughter and two grandkids   Social Determinants of Health   Financial Resource Strain: Low Risk    Difficulty of Paying Living Expenses: Not hard at all  Food Insecurity: No Food Insecurity   Worried About Charity fundraiser in the Last Year: Never true   Arboriculturist in the Last Year: Never true  Transportation Needs: No Transportation Needs   Lack of Transportation (Medical): No   Lack of Transportation (Non-Medical): No  Physical Activity: Sufficiently Active   Days of Exercise per Week: 7 days   Minutes of Exercise per Session: 40 min  Stress: No Stress Concern Present   Feeling of Stress : Not at all  Social Connections: Moderately Isolated   Frequency of Communication with Friends and Family: More than three times a week   Frequency of Social Gatherings with Friends and Family: More than three times a week   Attends Religious Services: More than 4 times per year   Active Member of [employer] or Organizations: No   Attends Archivist Meetings: Never   Marital Status: Widowed  Human resources officer Violence: Not At Risk   Fear of Current or Ex-Partner: No   Emotionally Abused: No   Physically Abused: No   Sexually Abused: No    Outpatient Medications Prior to Visit  Medication Sig Dispense Refill   albuterol (VENTOLIN HFA) 108 (90 Base) MCG/ACT inhaler INHALE 2 PUFFS INTO THE LUNGS EVERY 6 HOURS AS NEEDED FOR WHEEZING FOR SHORTNESS OF BREATH 18 g 12   ALPRAZolam (XANAX) 0.5 MG tablet TAKE 1 TABLET BY MOUTH TWICE DAILY AS NEEDED FOR ANXIETY AND FOR SLEEP 20 tablet 1   amLODipine (NORVASC) 2.5 MG tablet Take 1 tablet by mouth twice daily 180 tablet 3   aspirin EC 81 MG tablet Take 81 mg by mouth daily. Swallow whole.     Azelastine HCl  137 MCG/SPRAY SOLN USE 2  SPRAY(S) IN EACH NOSTRIL TWICE DAILY 30 mL 2   b complex vitamins tablet Take 1 tablet by mouth daily.     Cholecalciferol (VITAMIN D) 2000 UNITS CAPS Take by mouth daily.     EPINEPHrine 0.3 mg/0.3 mL IJ SOAJ injection Inject 0.3 mg into the muscle as needed for anaphylaxis. 2 each 1   fluticasone (FLONASE) 50 MCG/ACT nasal spray Place 2 sprays into both nostrils daily. 16 g 6   furosemide (LASIX) 20 MG tablet Take 1 tablet (20 mg total) by mouth every other day. 90 tablet 3   levocetirizine (XYZAL) 5 MG tablet TAKE 1 TABLET BY MOUTH ONCE DAILY IN THE EVENING 30 tablet 5   losartan (COZAAR) 100 MG tablet Take 1 tablet (100 mg total) by mouth daily. 90 tablet 1   montelukast (SINGULAIR) 10 MG tablet TAKE 1 TABLET BY MOUTH AT BEDTIME 90 tablet 1   Multiple Vitamin (MULTIVITAMIN) tablet Take 1 tablet by mouth daily.     olopatadine (PATANOL) 0.1 % ophthalmic solution Place 1 drop into both eyes 2 (two) times daily. 5 mL 12   omeprazole (PRILOSEC) 20 MG capsule Take 1 capsule by mouth once daily 90 capsule 0   rosuvastatin (CRESTOR) 10 MG tablet Take 1 tablet by mouth once daily 90 tablet 3   spironolactone (ALDACTONE) 25 MG tablet Take 1 tablet (25 mg total) by mouth daily. 90 tablet 3   tiZANidine (ZANAFLEX) 2 MG tablet TAKE 1/2 TO 2 TABLETS BY MOUTH TWICE DAILY AS NEEDED FOR MUSCLE SPASM 30 tablet 0   VITAMIN D, CHOLECALCIFEROL, PO Take by mouth.     No facility-administered medications prior to visit.    Allergies  Allergen Reactions   Codeine Nausea And Vomiting   Eggs Or Egg-Derived Products    Hydralazine Hcl Other (See Comments)    Pt reports causes dizziness   Lisinopril-Hydrochlorothiazide Hives    REACTION: lip swelling,facial swelling,rash.   Metoprolol Other (See Comments)    Does not feel well on it    Other Cough    Patient states she is allergic to Shrimp, egg whites, tomatoes and dairy products.  Says they make her feel unwell but do not cause  hives, SOB or anaphylaxis.  She just avoids eating them because they can make her have a headache.  Strawberries, watermelon, peanuts, and all shellfish.     Shrimp [Shellfish Allergy] Other (See Comments)    Was allergy tested and showed shrimp was one--has not reacted b/c avoided   Tomato Other (See Comments)    Review of Systems  Constitutional:  Positive for fever and malaise/fatigue.  HENT:  Positive for congestion.   Eyes:  Negative for blurred vision.  Respiratory:  Positive for cough and sputum production. Negative for shortness of breath.   Cardiovascular:  Negative for chest pain, palpitations and leg swelling.  Gastrointestinal:  Negative for abdominal pain, blood in stool and nausea.  Genitourinary:  Negative for dysuria and frequency.  Musculoskeletal:  Negative for falls.  Skin:  Negative for rash.  Neurological:  Negative for dizziness, loss of consciousness and headaches.  Endo/Heme/Allergies:  Negative for environmental allergies.  Psychiatric/Behavioral:  Negative for depression. The patient is not nervous/anxious.       Objective:    Physical Exam Constitutional:      General: She is not in acute distress.    Appearance: She is well-developed.  HENT:     Head: Normocephalic and atraumatic.  Eyes:     Conjunctiva/sclera: Conjunctivae normal.  Neck:     Thyroid: No thyromegaly.  Cardiovascular:     Rate and Rhythm: Normal rate and regular rhythm.     Heart sounds: Normal heart sounds. No murmur heard. Pulmonary:     Effort: Pulmonary effort is normal. No respiratory distress.     Breath sounds: Normal breath sounds.  Abdominal:     General: Bowel sounds are normal. There is no distension.     Palpations: Abdomen is soft. There is no mass.     Tenderness: There is no abdominal tenderness.  Musculoskeletal:     Cervical back: Neck supple.  Lymphadenopathy:     Cervical: No cervical adenopathy.  Skin:    General: Skin is warm and dry.  Neurological:      Mental Status: She is alert and oriented to person, place, and time.  Psychiatric:        Behavior: Behavior normal.    BP 122/74   Pulse 79   Temp 98.6 F (37 C)   Resp 16   Wt 224 lb 9.6 oz (101.9 kg)   SpO2 96%   BMI 45.36 kg/m  Wt Readings from Last 3 Encounters:  04/20/21 224 lb 9.6 oz (101.9 kg)  04/17/21 226 lb (102.5 kg)  03/03/21 226 lb (102.5 kg)    Diabetic Foot Exam - Simple   No data filed    Lab Results  Component Value Date   WBC 7.4 04/20/2021   HGB 12.5 04/20/2021   HCT 38.3 04/20/2021   PLT 231.0 04/20/2021   GLUCOSE 109 (H) 04/20/2021   CHOL 121 04/20/2021   TRIG 61.0 04/20/2021   HDL 33.90 (L) 04/20/2021   LDLCALC 75 04/20/2021   ALT 43 (H) 04/20/2021   AST 29 04/20/2021   NA 141 04/20/2021   K 3.9 04/20/2021   CL 104 04/20/2021   CREATININE 0.93 04/20/2021   BUN 18 04/20/2021   CO2 28 04/20/2021   TSH 1.01 04/20/2021   INR 1.06 01/10/2015   HGBA1C 5.6 04/20/2021    Lab Results  Component Value Date   TSH 1.01 04/20/2021   Lab Results  Component Value Date   WBC 7.4 04/20/2021   HGB 12.5 04/20/2021   HCT 38.3 04/20/2021   MCV 91.2 04/20/2021   PLT 231.0 04/20/2021   Lab Results  Component Value Date   NA 141 04/20/2021   K 3.9 04/20/2021   CO2 28 04/20/2021   GLUCOSE 109 (H) 04/20/2021   BUN 18 04/20/2021   CREATININE 0.93 04/20/2021   BILITOT 0.5 04/20/2021   ALKPHOS 76 04/20/2021   AST 29 04/20/2021   ALT 43 (H) 04/20/2021   PROT 6.8 04/20/2021   ALBUMIN 3.9 04/20/2021   CALCIUM 8.7 04/20/2021   ANIONGAP 9 09/15/2017   EGFR 56 (L) 03/10/2021   GFR 60.93 04/20/2021   Lab Results  Component Value Date   CHOL 121 04/20/2021   Lab Results  Component Value Date   HDL 33.90 (L) 04/20/2021   Lab Results  Component Value Date   LDLCALC 75 04/20/2021   Lab Results  Component Value Date   TRIG 61.0 04/20/2021   Lab Results  Component Value Date   CHOLHDL 4 04/20/2021   Lab Results  Component Value Date    HGBA1C 5.6 04/20/2021       Assessment & Plan:   Problem List Items Addressed This Visit     Essential hypertension - Primary    Well controlled, no changes to meds. Encouraged heart healthy  diet such as the DASH diet and exercise as tolerated.        Relevant Orders   CBC (Completed)   Comprehensive metabolic panel (Completed)   TSH (Completed)   Hyperglycemia    hgba1c acceptable, minimize simple carbs. Increase exercise as tolerated.       Relevant Orders   Hemoglobin A1c (Completed)   Comprehensive metabolic panel (Completed)   Vitamin D deficiency    Supplement and monitor       Relevant Orders   Comprehensive metabolic panel (Completed)   VITAMIN D 25 Hydroxy (Vit-D Deficiency, Fractures) (Completed)   Hyperlipidemia    Encourage heart healthy diet such as MIND or DASH diet, increase exercise, avoid trans fats, simple carbohydrates and processed foods, consider a krill or fish or flaxseed oil cap daily. Tolerating Rosuvastatin       Relevant Orders   Lipid panel (Completed)   COVID    She tested positive a week ago today on Thursday. Had symptoms probably 3 days before. Noted a Tmax of 103. Cough is productive of clear phlegm. Cough is worse at night. No gi symptoms. Has a slight headache many of these c/w her normal allergies. Notes fatigue but mild. No myalgias. Given Promethazine DM syrup and seek care as needed and if oxygen levels drop below 90%        I am having Anne Barnes start on promethazine-dextromethorphan. I am also having her maintain her multivitamin, Vitamin D, b complex vitamins, omeprazole, fluticasone, olopatadine, albuterol, EPINEPHrine, ALPRAZolam, spironolactone, rosuvastatin, (VITAMIN D, CHOLECALCIFEROL, PO), levocetirizine, amLODipine, losartan, tiZANidine, Azelastine HCl, aspirin EC, furosemide, and montelukast.  Meds ordered this encounter  Medications   promethazine-dextromethorphan (PROMETHAZINE-DM) 6.25-15 MG/5ML syrup     Sig: Take 2.5-5 mLs by mouth 3 (three) times daily as needed for cough.    Dispense:  180 mL    Refill:  1     Penni Homans, MD

## 2021-04-24 ENCOUNTER — Ambulatory Visit: Payer: Self-pay

## 2021-04-27 NOTE — Progress Notes (Signed)
Cardiology Office Note:    Date:  04/28/2021   ID:  Anne Barnes, DOB 24-Aug-1946, MRN [id number]  PCP:  Mosie Lukes, MD   [facility] Providers Cardiologist:  Freada Bergeron, MD Cardiology APP:  Sharmon Revere      Referring MD: Mosie Lukes, MD   Chief Complaint:  Follow-up (CAD, CHF)    Patient Profile:    Anne Barnes is a 75 y.o. female with:  Coronary artery disease Non-obs dz; Ca2+ score 127 on CTA in 1/21 (HFpEF) heart failure with preserved ejection fraction Echocardiogram 7/22: EF 60-65 Bradycardia 2/2 beta-blocker  Hypertension Hyperlipidemia OSA intol to CPAP Asthma IBS    Prior CV studies: Echocardiogram 03/31/21 EF 60-65, no RWMA, normal RVSF, trivial MR  LONG TERM MONITOR (8-14 DAYS) INTERPRETATION 12/29/2019 -Sinus bradycardia to sinus tachycardia. Patient had a min HR of 39 bpm, max HR of 167 bpm, and avg HR of 57  -Short episodes of SVT, no other significant arrhythmias.   CT CORONARY MORPH W/CTA COR W/SCORE W/CA W/CM &/OR WO/CM 11/20/2019 Coronary Arteries:  Normal coronary origin.  Right dominance. RCA is a large dominant artery that gives rise to PDA and PLA. There is significant motion, however there is only mild mixed plaque in the proximal and mid RCA with 25-49% plaque. Left main is a large artery that gives rise to LAD and LCX arteries. Left main has no plaque. LAD is a large vessel that has mild calcified plaque in the proximal and mid LAD with stenosis 0-25%. Distal LAD has no significant plaque. D1 is a very small branch with no plaque. LCX is a non-dominant artery that gives rise to two OM branches, there is minimal plaque.   IMPRESSION: 1. Coronary calcium score of 127. This was 19 percentile for age and sex matched control. 2. Normal coronary origin with right dominance. 3. CAD-RADS 2. Mild non-obstructive CAD (25-49%). Consider non-atherosclerotic causes of chest pain. Consider preventive therapy and  risk factor modification.   GATED SPECT MYO PERF W/LEXISCAN STRESS 2D 03/26/2017 EF 66, no sig ischemia, low risk    Echocardiogram 03/06/17 EF 60-65, no RWMA   History of Present Illness: Anne Barnes was last seen in 6/22.  She was s/w volume overloaded and I placed her back on furosemide.  An echocardiogram demonstrated normal LVF.  She returns for f/u.  She is here alone.  Overall, she has been doing fairly well.  She has occasional left breast pain.  She has not had any substernal chest discomfort or exertional chest pain.  Her breathing is overall stable.  She has more issues with allergies than anything else.  She has a lot of mucus with her allergies.  If she clears her mucus, her breathing is fine.  She sleeps in a recliner chronically.  She has chronic leg edema.  Her right leg is always greater than her left.  She has not had syncope.        Past Medical History:  Diagnosis Date   Acute combined systolic and diastolic heart failure ([place]) 05/26/2015   ALLERGIC RHINITIS    Allergic urticaria 08/01/2014   Patient reports allergies to shrimp, egg whites, tomatoes, dairy    Allergy    seasonal and numerous food and drug allergies.   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   Office Spirometry 11/07/17-WNL-FVC 1.58/82%, FEV1 1.27/85%, ratio 0.80, FEF 25-75% 0.26/89%   Bradycardia 02/25/2017   Chiari malformation    Noted MRI brain 09/2013 - s/p  neuro eval for same   DIVERTICULOSIS, COLON    Essential hypertension 08/28/2010   Qualifier: Diagnosis of  By: Marca Ancona RMA, Lucy      Frequent headaches    GERD    H/O measles    H/O mumps    Hearing loss 01/18/2017   History of chicken pox    Hyperglycemia 03/05/2016   HYPERTENSION    Hypocalcemia 02/25/2017   IBS (irritable bowel syndrome) 05/26/2015   Nonspecific abnormal electrocardiogram (ECG) (EKG)    OBSTRUCTIVE SLEEP APNEA 12/2008 dx   noncompliant with CPAP qhs   Obstructive sleep apnea 08/28/2010   NPSG 12/2008:  AHI 13/hr with desats to 78%     Preventative health care 06/11/2017   Sciatica    right side   Seasonal and perennial allergic rhinitis 08/28/2010   Allergy profile 03/11/14- positive especially for dust, cat and dog Food allergy profile- total IgE 86 with several food group elevations Sed rate-WNL      Sinusitis 06/06/2017   Sleep apnea    uses cpap   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   Vaginitis 01/18/2017   Vitamin D deficiency 06/11/2017    Current Medications: Current Meds  Medication Sig   albuterol (VENTOLIN HFA) 108 (90 Base) MCG/ACT inhaler INHALE 2 PUFFS INTO THE LUNGS EVERY 6 HOURS AS NEEDED FOR WHEEZING FOR SHORTNESS OF BREATH   ALPRAZolam (XANAX) 0.5 MG tablet TAKE 1 TABLET BY MOUTH TWICE DAILY AS NEEDED FOR ANXIETY AND FOR SLEEP   amLODipine (NORVASC) 2.5 MG tablet Take 1 tablet by mouth twice daily   aspirin EC 81 MG tablet Take 81 mg by mouth daily. Swallow whole.   Azelastine HCl 137 MCG/SPRAY SOLN USE 2 SPRAY(S) IN EACH NOSTRIL TWICE DAILY   b complex vitamins tablet Take 1 tablet by mouth daily.   Cholecalciferol (VITAMIN D) 2000 UNITS CAPS Take by mouth daily.   EPINEPHrine 0.3 mg/0.3 mL IJ SOAJ injection Inject 0.3 mg into the muscle as needed for anaphylaxis.   fluticasone (FLONASE) 50 MCG/ACT nasal spray Place 2 sprays into both nostrils daily.   levocetirizine (XYZAL) 5 MG tablet TAKE 1 TABLET BY MOUTH ONCE DAILY IN THE EVENING   losartan (COZAAR) 100 MG tablet Take 1 tablet (100 mg total) by mouth daily.   montelukast (SINGULAIR) 10 MG tablet TAKE 1 TABLET BY MOUTH AT BEDTIME   Multiple Vitamin (MULTIVITAMIN) tablet Take 1 tablet by mouth daily.   olopatadine (PATANOL) 0.1 % ophthalmic solution Place 1 drop into both eyes 2 (two) times daily.   omeprazole (PRILOSEC) 20 MG capsule Take 1 capsule by mouth once daily   promethazine-dextromethorphan (PROMETHAZINE-DM) 6.25-15 MG/5ML syrup Take 2.5-5 mLs by mouth 3 (three) times daily as needed for cough.   rosuvastatin (CRESTOR) 10 MG tablet Take 1  tablet by mouth once daily   spironolactone (ALDACTONE) 25 MG tablet Take 1 tablet (25 mg total) by mouth daily.   tiZANidine (ZANAFLEX) 2 MG tablet TAKE 1/2 TO 2 TABLETS BY MOUTH TWICE DAILY AS NEEDED FOR MUSCLE SPASM   VITAMIN D, CHOLECALCIFEROL, PO Take by mouth.   [DISCONTINUED] furosemide (LASIX) 20 MG tablet Take 1 tablet (20 mg total) by mouth every other day.     Allergies:   Codeine, Eggs or egg-derived products, Hydralazine hcl, Lisinopril-hydrochlorothiazide, Metoprolol, Other, Shrimp [shellfish allergy], and Tomato   Social History   Tobacco Use   Smoking status: Never   Smokeless tobacco: Never   Tobacco comments:    Married, lives with spouse, dtr  2 g-kids.  Vaping Use   Vaping Use: Never used  Substance Use Topics   Alcohol use: No    Comment: rare   Drug use: No     Family Hx: The patient's family history includes Alcohol abuse in her brother and another family member; Arthritis in her father and mother; Diabetes in her brother; Fibromyalgia in her daughter; Berenice Primas' disease in her daughter; Heart attack in her brother; Heart disease in her brother and father; Hypertension in her daughter, daughter, maternal grandfather, and sister; Kidney disease in her father, sister, and sister; Lung cancer in her brother; Lupus in her daughter; Obesity in her sister; Pneumonia in her brother and father; Rheum arthritis in her daughter; Sjogren's syndrome in her daughter; Throat cancer in her brother. There is no history of Ataxia, Chorea, Dementia, Mental retardation, Migraines, Multiple sclerosis, Neurofibromatosis, Neuropathy, Parkinsonism, Seizures, Stroke, Colon cancer, Colon polyps, Esophageal cancer, Rectal cancer, or Stomach cancer.  ROS   EKGs/Labs/Other Test Reviewed:    EKG:  EKG is   ordered today.  The ekg ordered today demonstrates normal sinus rhythm, heart rate 63, normal axis, septal Q waves, nonspecific ST-T wave changes, QTC 413  Recent Labs: 03/03/2021: NT-Pro  BNP 17 04/20/2021: ALT 43; BUN 18; Creatinine, Ser 0.93; Hemoglobin 12.5; Platelets 231.0; Potassium 3.9; Sodium 141; TSH 1.01   Recent Lipid Panel Lab Results  Component Value Date/Time   CHOL 121 04/20/2021 10:52 AM   CHOL 116 02/17/2020 09:04 AM   TRIG 61.0 04/20/2021 10:52 AM   TRIG 55 12/30/2009 12:00 AM   HDL 33.90 (L) 04/20/2021 10:52 AM   HDL 42 02/17/2020 09:04 AM   LDLCALC 75 04/20/2021 10:52 AM   LDLCALC 64 02/17/2020 09:04 AM      Risk Assessment/Calculations:      Physical Exam:    VS:  BP 124/60   Pulse 63   Ht '4\' 11"'$  (1.499 m)   Wt 225 lb 6.4 oz (102.2 kg)   SpO2 96%   BMI 45.53 kg/m     Wt Readings from Last 3 Encounters:  04/28/21 225 lb 6.4 oz (102.2 kg)  04/20/21 224 lb 9.6 oz (101.9 kg)  04/17/21 226 lb (102.5 kg)     Constitutional:      Appearance: Healthy appearance. Not in distress.  Pulmonary:     Effort: Pulmonary effort is normal.     Breath sounds: No wheezing. No rales.  Cardiovascular:     Normal rate. Regular rhythm. Normal S1. Normal S2.      Murmurs: There is no murmur.  Edema:    Peripheral edema present.    Pretibial: trace edema of the left pretibial area and 1+ edema of the right pretibial area. Abdominal:     Palpations: Abdomen is soft.  Musculoskeletal:     Cervical back: Neck supple. Skin:    General: Skin is warm and dry.  Neurological:     Mental Status: Alert and oriented to person, place and time.     Cranial Nerves: Cranial nerves are intact.         ASSESSMENT & PLAN:    1. Chronic heart failure with preserved ejection fraction [facility]) Recent echocardiogram July 2022 demonstrated EF 60-65 with normal diastolic function and no significant valvular abnormalities.  Volume status is currently stable.  She is NYHA IIb-III.  She is mainly troubled by allergic rhinitis.  She gets allergy shots.  She has a lot of leg cramps.  She stopped taking spironolactone and we placed  her back on furosemide at last visit.  I have  recommended that she continue on spironolactone and change her furosemide to every Monday, Wednesday, Friday.  Obtain a BMET in 2 weeks.  2. Coronary artery disease involving native coronary artery of native heart without angina pectoris Mild nonobstructive disease by coronary CT last year.  She does have some breast pain but no anginal symptoms.  Continue aspirin, statin.  Follow-up in 6 months.  3. Essential hypertension The patient's blood pressure is controlled on her current regimen.  Continue current therapy.   4. Pure hypercholesterolemia Recent LDL just above 70.  Recommend continuing current dose of rosuvastatin and continue to work on diet.  5. Breast pain I recommended she follow-up with primary care so that she can get a mammogram.  She has been resistant to this in the past but is now willing.   Dispo:  Return in about 6 months (around 10/29/2021) for Routine follow up in 6 months with Dr.Pemberton. .   Medication Adjustments/Labs and Tests Ordered: Current medicines are reviewed at length with the patient today.  Concerns regarding medicines are outlined above.  Tests Ordered: Orders Placed This Encounter  Procedures   Basic Metabolic Panel (BMET)   EKG 12-Lead   Medication Changes: Meds ordered this encounter  Medications   DISCONTD: furosemide (LASIX) 20 MG tablet    Sig: Take 1 tablet (20 mg total) by mouth 3 (three) times a week.    Dispense:  90 tablet    Refill:  3   furosemide (LASIX) 20 MG tablet    Sig: Take 1 tablet (20 mg total) by mouth 3 (three) times a week. Monday, Wednesday and Friday.    Dispense:  90 tablet    Refill:  3    Signed, Richardson Dopp, PA-C  04/28/2021 12:54 PM    [facility] [address], [place], [place]  [address] Phone: [phone]; Fax: [phone]

## 2021-04-28 ENCOUNTER — Ambulatory Visit (INDEPENDENT_AMBULATORY_CARE_PROVIDER_SITE_OTHER): Payer: Medicare HMO

## 2021-04-28 ENCOUNTER — Ambulatory Visit: Payer: Medicare HMO | Admitting: Physician Assistant

## 2021-04-28 ENCOUNTER — Other Ambulatory Visit: Payer: Self-pay

## 2021-04-28 ENCOUNTER — Encounter: Payer: Self-pay | Admitting: Physician Assistant

## 2021-04-28 VITALS — BP 124/60 | HR 63 | Ht 59.0 in | Wt 225.4 lb

## 2021-04-28 DIAGNOSIS — I5032 Chronic diastolic (congestive) heart failure: Secondary | ICD-10-CM

## 2021-04-28 DIAGNOSIS — E7800 Pure hypercholesterolemia, unspecified: Secondary | ICD-10-CM | POA: Diagnosis not present

## 2021-04-28 DIAGNOSIS — I2510 Atherosclerotic heart disease of native coronary artery without angina pectoris: Secondary | ICD-10-CM | POA: Diagnosis not present

## 2021-04-28 DIAGNOSIS — J309 Allergic rhinitis, unspecified: Secondary | ICD-10-CM

## 2021-04-28 DIAGNOSIS — I10 Essential (primary) hypertension: Secondary | ICD-10-CM | POA: Diagnosis not present

## 2021-04-28 DIAGNOSIS — N644 Mastodynia: Secondary | ICD-10-CM | POA: Diagnosis not present

## 2021-04-28 MED ORDER — FUROSEMIDE 20 MG PO TABS: 20.0000 mg | ORAL_TABLET | ORAL | 3 refills | Status: DC

## 2021-04-28 MED ORDER — FUROSEMIDE 20 MG PO TABS: 20.0000 mg | ORAL_TABLET | ORAL | 3 refills | Status: AC

## 2021-04-28 NOTE — Patient Instructions (Signed)
Medication Instructions:    RESTART Spironolactone one tablet by mouth ( 25 mg) daily.   CHANGE Lasix one tablet by mouth ( 20 mg) Mon, Wed, and Friday. *If you need a refill on your cardiac medications before your next appointment, please call your pharmacy*   Lab Work: Your physician recommends that you return for lab work BMET on Friday August 19 between 7:30 - 4:30.   If you have labs (blood work) drawn today and your tests are completely normal, you will receive your results only by: [place] (if you have MyChart) OR A paper copy in the mail If you have any lab test that is abnormal or we need to change your treatment, we will call you to review the results.   Testing/Procedures:  -NONE-  Follow-Up: At [facility], you and your health needs are our priority.  As part of our continuing mission to provide you with exceptional heart care, we have created designated Provider Care Teams.  These Care Teams include your primary Cardiologist (physician) and Advanced Practice Providers (APPs -  Physician Assistants and Nurse Practitioners) who all work together to provide you with the care you need, when you need it.  We recommend signing up for the patient portal called "MyChart".  Sign up information is provided on this After Visit Summary.  MyChart is used to connect with patients for Virtual Visits (Telemedicine).  Patients are able to view lab/test results, encounter notes, upcoming appointments, etc.  Non-urgent messages can be sent to your provider as well.   To learn more about what you can do with MyChart, go to [email or web address].    Your next appointment:   6 month(s) with Dr. Johney Frame on Wednesday, February 1 @ 10:40 am.   The format for your next appointment:   In Person  Provider:   Gwyndolyn Kaufman, MD   Other Instructions

## 2021-05-04 ENCOUNTER — Ambulatory Visit (INDEPENDENT_AMBULATORY_CARE_PROVIDER_SITE_OTHER): Payer: Medicare HMO | Admitting: *Deleted

## 2021-05-04 DIAGNOSIS — J309 Allergic rhinitis, unspecified: Secondary | ICD-10-CM | POA: Diagnosis not present

## 2021-05-12 ENCOUNTER — Ambulatory Visit (INDEPENDENT_AMBULATORY_CARE_PROVIDER_SITE_OTHER): Payer: Medicare HMO

## 2021-05-12 ENCOUNTER — Other Ambulatory Visit: Payer: Self-pay

## 2021-05-12 ENCOUNTER — Other Ambulatory Visit: Payer: Medicare HMO | Admitting: *Deleted

## 2021-05-12 DIAGNOSIS — N644 Mastodynia: Secondary | ICD-10-CM

## 2021-05-12 DIAGNOSIS — I10 Essential (primary) hypertension: Secondary | ICD-10-CM

## 2021-05-12 DIAGNOSIS — J309 Allergic rhinitis, unspecified: Secondary | ICD-10-CM | POA: Diagnosis not present

## 2021-05-12 DIAGNOSIS — I2510 Atherosclerotic heart disease of native coronary artery without angina pectoris: Secondary | ICD-10-CM

## 2021-05-12 DIAGNOSIS — E7800 Pure hypercholesterolemia, unspecified: Secondary | ICD-10-CM | POA: Diagnosis not present

## 2021-05-12 DIAGNOSIS — I5032 Chronic diastolic (congestive) heart failure: Secondary | ICD-10-CM | POA: Diagnosis not present

## 2021-05-13 LAB — BASIC METABOLIC PANEL
BUN/Creatinine Ratio: 18 (ref 12–28)
BUN: 18 mg/dL (ref 8–27)
CO2: 24 mmol/L (ref 20–29)
Calcium: 9.5 mg/dL (ref 8.7–10.3)
Chloride: 105 mmol/L (ref 96–106)
Creatinine, Ser: 0.98 mg/dL (ref 0.57–1.00)
Glucose: 93 mg/dL (ref 65–99)
Potassium: 4.4 mmol/L (ref 3.5–5.2)
Sodium: 142 mmol/L (ref 134–144)
eGFR: 61 mL/min/{1.73_m2} (ref >59)

## 2021-05-16 NOTE — Progress Notes (Signed)
VIAL MADE. EXP 05-16-22

## 2021-05-17 ENCOUNTER — Ambulatory Visit: Payer: Self-pay

## 2021-05-17 ENCOUNTER — Ambulatory Visit (INDEPENDENT_AMBULATORY_CARE_PROVIDER_SITE_OTHER): Payer: Medicare HMO | Admitting: Allergy

## 2021-05-17 ENCOUNTER — Other Ambulatory Visit: Payer: Self-pay

## 2021-05-17 ENCOUNTER — Encounter: Payer: Self-pay | Admitting: Allergy

## 2021-05-17 VITALS — BP 136/82 | HR 76 | Temp 98.3°F | Ht 59.0 in | Wt 228.2 lb

## 2021-05-17 DIAGNOSIS — J3089 Other allergic rhinitis: Secondary | ICD-10-CM | POA: Diagnosis not present

## 2021-05-17 DIAGNOSIS — T7800XA Anaphylactic reaction due to unspecified food, initial encounter: Secondary | ICD-10-CM

## 2021-05-17 DIAGNOSIS — J4520 Mild intermittent asthma, uncomplicated: Secondary | ICD-10-CM

## 2021-05-17 NOTE — Patient Instructions (Addendum)
-   continue avoidance measures for dust mites, cat and dog.   - continue Xyzal '5mg'$  and can take 1 1/2 tablets to increase the dose to see if this will be more effective - continue Singulair '10mg'$  daily - for nasal drainage and post-nasal drip continue use of nasal antihistamine, Astelin 2 sprays each nostril 1-2 times a day as needed.  This nasal spray is different than Xhance.   - recommend performing nasal saline rinses prior to use of medicated nasal sprays to help flush out the sinuses.  Use with distilled water at room temperature.  Breathe in and out through your mouth during the entire process. - continue allergy injections per schedule.  You have reached maintenance phase!  - continue food avoidance of shellfish, gluten, soybean, peanut  - skin testing to these foods were negative at initial visit.   - have access to your epinephrine device in case of allergic reaction.   - follow emergency action plan in case of allergic reaction   - have access to albuterol inhaler 2 puffs every 4-6 hours as needed for cough/wheeze/shortness of breath/chest tightness.  May use 15-20 minutes prior to activity.   Monitor frequency of use.    Follow-up 6 months or sooner if needed

## 2021-05-17 NOTE — Progress Notes (Signed)
Follow-up Note  RE: Anne Barnes MRN: [id number] DOB: Jan 25, 1946 Date of Office Visit: 05/17/2021   History of present illness: Anne Barnes is a 75 y.o. female presenting today for follow-up of chronic rhinosinusitis, food allergy and intermittent asthma.  He was last seen in the office on 11/16/2020 by myself.  She had Covid in July 2022 where she had cough and was feeling sick in general.  She states at night she was having more respiratory symptoms.  She did a home test and it was positive.  She states she did need to use albuterol during covid illness.  She talked to her PCP at the end of July and symptoms had improved.  She states she did not know their antiviral medications that she could take to help with her symptoms related to [place].  States the cough has resolved but she still has nasal congestion.   She states currently she is having sinus drainage.   She has noted good improvement in her allergy symptoms with being on immunotherapy.  She has not reached maintenance dosing.  She is not having any large local or systemic symptoms. She states she got her AC serviced in the past 2 weeks and she is currently getting her drapes cleaned and reinstall.  She states she is trying to stay on top of the dust in her home. She states she doesn't use the nasal sprays much as it makes her nose burn.  She also states the sinus rinses burns her nose as well.  She states she is using warm water rags to her face that do help with her sinus symptoms.  She does not feel the Xyzal is working.  She wants to know if there is something stronger she could take.  She does continue to take Singulair daily. She continues to avoid shellfish, gluten products, soybean and peanut.  She states sometimes she may have gluten that is mixed in into foods but she tries her best to avoid gluten.  She has access to her epinephrine device which she has not needed to use.  Review of systems in the past 4 weeks: Review of  Systems  Constitutional:  Positive for malaise/fatigue.  HENT:         See HPI  Eyes: Negative.   Respiratory:  Positive for cough and shortness of breath.   Cardiovascular: Negative.   Gastrointestinal: Negative.   Musculoskeletal:  Positive for myalgias.  Skin: Negative.   Neurological: Negative.    All other systems negative unless noted above in HPI  Past medical/social/surgical/family history have been reviewed and are unchanged unless specifically indicated below.  No changes  Medication List: Current Outpatient Medications  Medication Sig Dispense Refill   albuterol (VENTOLIN HFA) 108 (90 Base) MCG/ACT inhaler INHALE 2 PUFFS INTO THE LUNGS EVERY 6 HOURS AS NEEDED FOR WHEEZING FOR SHORTNESS OF BREATH 18 g 12   ALPRAZolam (XANAX) 0.5 MG tablet TAKE 1 TABLET BY MOUTH TWICE DAILY AS NEEDED FOR ANXIETY AND FOR SLEEP 20 tablet 1   amLODipine (NORVASC) 2.5 MG tablet Take 1 tablet by mouth twice daily 180 tablet 3   aspirin EC 81 MG tablet Take 81 mg by mouth daily. Swallow whole.     Azelastine HCl 137 MCG/SPRAY SOLN USE 2 SPRAY(S) IN EACH NOSTRIL TWICE DAILY 30 mL 2   b complex vitamins tablet Take 1 tablet by mouth daily.     Cholecalciferol (VITAMIN D) 2000 UNITS CAPS Take by mouth daily.  EPINEPHrine 0.3 mg/0.3 mL IJ SOAJ injection Inject 0.3 mg into the muscle as needed for anaphylaxis. 2 each 1   fluticasone (FLONASE) 50 MCG/ACT nasal spray Place 2 sprays into both nostrils daily. 16 g 6   furosemide (LASIX) 20 MG tablet Take 1 tablet (20 mg total) by mouth 3 (three) times a week. Monday, Wednesday and Friday. 90 tablet 3   levocetirizine (XYZAL) 5 MG tablet TAKE 1 TABLET BY MOUTH ONCE DAILY IN THE EVENING 30 tablet 5   losartan (COZAAR) 100 MG tablet Take 1 tablet (100 mg total) by mouth daily. 90 tablet 1   montelukast (SINGULAIR) 10 MG tablet TAKE 1 TABLET BY MOUTH AT BEDTIME 90 tablet 1   Multiple Vitamin (MULTIVITAMIN) tablet Take 1 tablet by mouth daily.      olopatadine (PATANOL) 0.1 % ophthalmic solution Place 1 drop into both eyes 2 (two) times daily. 5 mL 12   omeprazole (PRILOSEC) 20 MG capsule Take 1 capsule by mouth once daily 90 capsule 0   rosuvastatin (CRESTOR) 10 MG tablet Take 1 tablet by mouth once daily 90 tablet 3   spironolactone (ALDACTONE) 25 MG tablet Take 1 tablet (25 mg total) by mouth daily. 90 tablet 3   tiZANidine (ZANAFLEX) 2 MG tablet TAKE 1/2 TO 2 TABLETS BY MOUTH TWICE DAILY AS NEEDED FOR MUSCLE SPASM 30 tablet 0   VITAMIN D, CHOLECALCIFEROL, PO Take by mouth.     promethazine-dextromethorphan (PROMETHAZINE-DM) 6.25-15 MG/5ML syrup Take 2.5-5 mLs by mouth 3 (three) times daily as needed for cough. (Patient not taking: Reported on 05/17/2021) 180 mL 1   No current facility-administered medications for this visit.     Known medication allergies: Allergies  Allergen Reactions   Codeine Nausea And Vomiting   Eggs Or Egg-Derived Products    Hydralazine Hcl Other (See Comments)    Pt reports causes dizziness   Lisinopril-Hydrochlorothiazide Hives    REACTION: lip swelling,facial swelling,rash.   Metoprolol Other (See Comments)    Does not feel well on it    Other Cough    Patient states she is allergic to Shrimp, egg whites, tomatoes and dairy products.  Says they make her feel unwell but do not cause hives, SOB or anaphylaxis.  She just avoids eating them because they can make her have a headache.  Strawberries, watermelon, peanuts, and all shellfish.     Shrimp [Shellfish Allergy] Other (See Comments)    Was allergy tested and showed shrimp was one--has not reacted b/c avoided   Tomato Other (See Comments)     Physical examination: Blood pressure 136/82, pulse 76, temperature 98.3 F (36.8 C), temperature source Temporal, height '4\' 11"'$  (1.499 m), weight 228 lb 4 oz (103.5 kg), SpO2 97 %.  General: Alert, interactive, in no acute distress. HEENT: PERRLA, TMs pearly gray, turbinates mildly edematous without  discharge, post-pharynx non erythematous. Neck: Supple without lymphadenopathy. Lungs: Clear to auscultation without wheezing, rhonchi or rales. {no increased work of breathing. CV: Normal S1, S2 without murmurs. Abdomen: Nondistended, nontender. Skin: Warm and dry, without lesions or rashes. Extremities:  No clubbing, cyanosis or edema. Neuro:   Grossly intact.  Diagnositics/Labs:   Assessment and plan: Chronic rhinosinusitis, allergic  - continue avoidance measures for dust mites, cat and dog.   - continue Xyzal '5mg'$  and can take 1 1/2 tablets to increase the dose to see if this will be more effective - continue Singulair '10mg'$  daily - for nasal drainage and post-nasal drip continue use of nasal antihistamine,  Astelin 2 sprays each nostril 1-2 times a day as needed.  This nasal spray is different than Xhance.   - recommend performing nasal saline rinses prior to use of medicated nasal sprays to help flush out the sinuses.  Use with distilled water at room temperature.  Breathe in and out through your mouth during the entire process. - continue allergy injections per schedule.  You have reached maintenance phase!  Food allergy - continue food avoidance of shellfish, gluten, soybean, peanut  - skin testing to these foods were negative at initial visit.   - have access to your epinephrine device in case of allergic reaction.   - follow emergency action plan in case of allergic reaction   Intermittent asthma - have access to albuterol inhaler 2 puffs every 4-6 hours as needed for cough/wheeze/shortness of breath/chest tightness.  May use 15-20 minutes prior to activity.   Monitor frequency of use.    Follow-up 6 months or sooner if needed  I appreciate the opportunity to take part in [employer] care. Please do not hesitate to contact me with questions.  Sincerely,   Prudy Feeler, MD Allergy/Immunology [facility]

## 2021-05-18 ENCOUNTER — Telehealth: Payer: Self-pay | Admitting: Allergy

## 2021-05-18 DIAGNOSIS — J3089 Other allergic rhinitis: Secondary | ICD-10-CM

## 2021-05-18 MED ORDER — OLOPATADINE HCL 0.1 % OP SOLN: 1.0000 [drp] | Freq: Two times a day (BID) | OPHTHALMIC | 5 refills | Status: DC

## 2021-05-18 NOTE — Telephone Encounter (Signed)
Patient states she forgot to ask Dr. Nelva Bush at yesterday's visit if any eye drops for allergies could be sent in. Patient is requesting eye drops.   [facility] - [address], [place] [address]  Best contact number: [phone]

## 2021-05-18 NOTE — Telephone Encounter (Signed)
Called to inform patient that we have sent in Olopatadine 0.1%  to the [facility] on [facility] and she should use 1 drop each eye daily as needed.

## 2021-05-25 ENCOUNTER — Ambulatory Visit (INDEPENDENT_AMBULATORY_CARE_PROVIDER_SITE_OTHER): Payer: Medicare HMO | Admitting: *Deleted

## 2021-05-25 DIAGNOSIS — J309 Allergic rhinitis, unspecified: Secondary | ICD-10-CM | POA: Diagnosis not present

## 2021-06-01 ENCOUNTER — Ambulatory Visit (INDEPENDENT_AMBULATORY_CARE_PROVIDER_SITE_OTHER): Payer: Medicare HMO

## 2021-06-01 DIAGNOSIS — J309 Allergic rhinitis, unspecified: Secondary | ICD-10-CM | POA: Diagnosis not present

## 2021-06-02 NOTE — Progress Notes (Deleted)
HPI female never smoker followed for rhinitis/allergies, asthma, OSA, CHF, HTN, GERD, IBS, Peanut and cat cause coughing and we advised her to avoid them Allergy profile 03/11/14- positive especially for dust, cat and dog Food allergy profile- total IgE 86 with several food group elevations Sed rate-WNL Office Spirometry 11/07/17-WNL-FVC 1.58/82%, FEV1 1.27/85%, ratio 0.80, FEF 25-75% 0.26/89% HST 12/05/2017-AHI 12.3/hour, desaturation to 82%, body weight 226 pounds ----------------------------------------------------------------------------------------   06/08/20-  75 year old female never smoker followed for Rhinitis/Allergies, Asthma, OSA,  Complicated by CHF, HTN, GERD, IBS, Chiari Malformation,  CPAP auto 5-15/ [employer]/ [employer] Download- compliance 63%, AHI 2.6/ hr   Many short nights Body weight today- 223 lbs Covid vax- 2 Moderna -----asthma,osa, mucus build up causes sob, using cpap nightly, denies cpap problems Singulair, Ventolin hfa, Xhance nasal or Flonase, Astelin,  Her allergist is about to start allergy shots for "Mucus draining dowwn the left side of throat". She describes choking when swallowing water, which brings up clear mucus. Need to watch for LPR/ penetration. Takes off her CPAP in her sleep. If up to bathjroom she puts it back on. Denies physically restless sleep. Wears CPAP for daily nap. Noted BP up on arrival 160/72. Says she was in hurry and skipped meds. Has cardiology appt this afternoon.  CT chest 01/26/20-  IMPRESSION: 1. Right lower lobe opacities noted on prior exam are not visualized currently, most consistent with resolved inflammation. 2. Mild coronary artery calcifications are noted suggesting coronary artery disease. 3. Small sliding-type hiatal hernia. 4. Bilateral thyroid nodules are noted, the largest measuring 1.3 cm on the right and 11 mm on the left. Not clinically significant; no follow-up imaging recommended. (Ref: J Am Coll Radiol.  2015 Feb;12(2): 143-50). Aortic Atherosclerosis (ICD10-I70.0).  06/05/21- 75 year old female never smoker followed for OSA,  Complicated by CHF, HTN, GERD, IBS, Chiari Malformation,  Rhinitis/Allergies, Asthma, ( on allergy vaccine from Allergist) CPAP auto 5-15/ [employer]/ [employer] Download- Body weight today- Covid vax-   ROS-see HPI  + = positive Constitutional:   No-   weight loss, night sweats, fevers, chills, fatigue, lassitude. HEENT:   +headaches, difficulty swallowing, tooth/dental problems, sore throat,       No-  sneezing, itching, ear ache, nasal congestion, +post nasal drip,  CV:  No-   chest pain, orthopnea, PND, swelling in lower extremities, anasarca,  +dizziness, palpitations Resp: +shortness of breath with exertion or at rest.              productive cough,  No non-productive cough,  No- coughing up of blood.              No-   change in color of mucus.  No- wheezing.   Skin: No-   rash or lesions. GI:  No-   heartburn, indigestion, abdominal pain, nausea, vomiting,  GU: . MS:  No-   joint pain or swelling.   Neuro-     nothing unusual Psych:  No- change in mood or affect. No depression or anxiety.  No memory loss.  OBJ- Physical Exam General- Alert, Oriented, Affect-appropriate, Distress- none acute, + obese Skin- rash-none, lesions- none, excoriation- none Lymphadenopathy- none Head- atraumatic            Eyes- Gross vision intact, PERRLA, conjunctivae and secretions clear            Ears- Hearing, canals-normal            Nose- + turbinate edema, no-Septal dev, mucus, polyps, erosion, perforation  Throat- Mallampati III-IV , mucosa clear , drainage- none, tonsils- atrophic, +Dentures Neck- flexible , trachea midline, no stridor , thyroid nl, carotid no bruit Chest - symmetrical excursion , unlabored           Heart/CV- RRR , no murmur , no gallop  , no rub, nl s1 s2                           - JVD- none , edema- none, stasis changes- none, varices-  none           Lung- clear to P&A, wheeze-none, cough none, dullness-none, rub- none           Chest wall-  Abd- Br/ Gen/ Rectal- Not done, not indicated Extrem- cyanosis- none, clubbing, none, atrophy- none, strength- nl Neuro- grossly intact to observation

## 2021-06-05 ENCOUNTER — Ambulatory Visit: Payer: Medicare HMO | Admitting: Internal Medicine

## 2021-06-08 ENCOUNTER — Ambulatory Visit (HOSPITAL_BASED_OUTPATIENT_CLINIC_OR_DEPARTMENT_OTHER)
Admission: RE | Admit: 2021-06-08 | Discharge: 2021-06-08 | Disposition: A | Discharge status: Home / Self Care | Payer: Medicare HMO | Source: Ambulatory Visit | Attending: Family Medicine | Admitting: Family Medicine

## 2021-06-08 ENCOUNTER — Other Ambulatory Visit: Payer: Self-pay

## 2021-06-08 DIAGNOSIS — M810 Age-related osteoporosis without current pathological fracture: Secondary | ICD-10-CM | POA: Diagnosis not present

## 2021-06-08 DIAGNOSIS — Z780 Asymptomatic menopausal state: Secondary | ICD-10-CM | POA: Diagnosis not present

## 2021-06-09 ENCOUNTER — Ambulatory Visit (INDEPENDENT_AMBULATORY_CARE_PROVIDER_SITE_OTHER): Payer: Medicare HMO

## 2021-06-09 DIAGNOSIS — J309 Allergic rhinitis, unspecified: Secondary | ICD-10-CM

## 2021-06-13 DIAGNOSIS — G4733 Obstructive sleep apnea (adult) (pediatric): Secondary | ICD-10-CM | POA: Diagnosis not present

## 2021-06-14 ENCOUNTER — Ambulatory Visit (INDEPENDENT_AMBULATORY_CARE_PROVIDER_SITE_OTHER): Payer: Medicare HMO

## 2021-06-14 DIAGNOSIS — J309 Allergic rhinitis, unspecified: Secondary | ICD-10-CM | POA: Diagnosis not present

## 2021-06-20 ENCOUNTER — Other Ambulatory Visit: Payer: Self-pay | Admitting: Family Medicine

## 2021-06-20 ENCOUNTER — Other Ambulatory Visit: Payer: Self-pay | Admitting: Allergy

## 2021-06-20 DIAGNOSIS — J3089 Other allergic rhinitis: Secondary | ICD-10-CM

## 2021-06-22 ENCOUNTER — Ambulatory Visit (INDEPENDENT_AMBULATORY_CARE_PROVIDER_SITE_OTHER): Payer: Medicare HMO

## 2021-06-22 DIAGNOSIS — J309 Allergic rhinitis, unspecified: Secondary | ICD-10-CM

## 2021-06-27 ENCOUNTER — Ambulatory Visit (INDEPENDENT_AMBULATORY_CARE_PROVIDER_SITE_OTHER): Payer: Medicare HMO | Admitting: *Deleted

## 2021-06-27 DIAGNOSIS — J309 Allergic rhinitis, unspecified: Secondary | ICD-10-CM

## 2021-07-06 ENCOUNTER — Ambulatory Visit (INDEPENDENT_AMBULATORY_CARE_PROVIDER_SITE_OTHER): Payer: Medicare HMO | Admitting: *Deleted

## 2021-07-06 DIAGNOSIS — J309 Allergic rhinitis, unspecified: Secondary | ICD-10-CM

## 2021-07-13 ENCOUNTER — Other Ambulatory Visit: Payer: Self-pay | Admitting: Internal Medicine

## 2021-07-17 ENCOUNTER — Telehealth: Payer: Self-pay | Admitting: Internal Medicine

## 2021-07-18 MED ORDER — ALBUTEROL SULFATE HFA 108 (90 BASE) MCG/ACT IN AERS: INHALATION_SPRAY | RESPIRATORY_TRACT | 1 refills | Status: DC

## 2021-07-18 NOTE — Telephone Encounter (Signed)
Spoke with Paulette and notified albuterol inhlaer refilled and that the pt should keep pending appt Dec 2022 for more refills. She verbalized understanding and will inform the pt. Nothing further needed.

## 2021-07-20 ENCOUNTER — Ambulatory Visit (INDEPENDENT_AMBULATORY_CARE_PROVIDER_SITE_OTHER): Payer: Medicare HMO | Admitting: *Deleted

## 2021-07-20 DIAGNOSIS — J309 Allergic rhinitis, unspecified: Secondary | ICD-10-CM

## 2021-07-31 ENCOUNTER — Encounter: Payer: Self-pay | Admitting: Family Medicine

## 2021-07-31 ENCOUNTER — Other Ambulatory Visit: Payer: Self-pay

## 2021-07-31 ENCOUNTER — Ambulatory Visit (INDEPENDENT_AMBULATORY_CARE_PROVIDER_SITE_OTHER): Payer: Medicare HMO | Admitting: Family Medicine

## 2021-07-31 VITALS — BP 116/68 | HR 70 | Temp 98.1°F | Resp 16 | Wt 233.4 lb

## 2021-07-31 DIAGNOSIS — J3089 Other allergic rhinitis: Secondary | ICD-10-CM | POA: Diagnosis not present

## 2021-07-31 DIAGNOSIS — M79672 Pain in left foot: Secondary | ICD-10-CM

## 2021-07-31 DIAGNOSIS — K589 Irritable bowel syndrome without diarrhea: Secondary | ICD-10-CM

## 2021-07-31 DIAGNOSIS — E559 Vitamin D deficiency, unspecified: Secondary | ICD-10-CM

## 2021-07-31 DIAGNOSIS — E7800 Pure hypercholesterolemia, unspecified: Secondary | ICD-10-CM

## 2021-07-31 DIAGNOSIS — I10 Essential (primary) hypertension: Secondary | ICD-10-CM | POA: Diagnosis not present

## 2021-07-31 DIAGNOSIS — Z23 Encounter for immunization: Secondary | ICD-10-CM

## 2021-07-31 DIAGNOSIS — M79671 Pain in right foot: Secondary | ICD-10-CM | POA: Diagnosis not present

## 2021-07-31 DIAGNOSIS — R739 Hyperglycemia, unspecified: Secondary | ICD-10-CM

## 2021-07-31 DIAGNOSIS — J302 Other seasonal allergic rhinitis: Secondary | ICD-10-CM

## 2021-07-31 LAB — COMPREHENSIVE METABOLIC PANEL
ALT: 30 U/L (ref 0–35)
AST: 20 U/L (ref 0–37)
Albumin: 3.9 g/dL (ref 3.5–5.2)
Alkaline Phosphatase: 87 U/L (ref 39–117)
BUN: 18 mg/dL (ref 6–23)
CO2: 30 mEq/L (ref 19–32)
Calcium: 8.8 mg/dL (ref 8.4–10.5)
Chloride: 105 mEq/L (ref 96–112)
Creatinine, Ser: 0.8 mg/dL (ref 0.40–1.20)
GFR: 72.86 mL/min (ref >60.00)
Glucose, Bld: 78 mg/dL (ref 70–99)
Potassium: 4.1 mEq/L (ref 3.5–5.1)
Sodium: 142 mEq/L (ref 135–145)
Total Bilirubin: 0.5 mg/dL (ref 0.2–1.2)
Total Protein: 6.9 g/dL (ref 6.0–8.3)

## 2021-07-31 LAB — CBC
HCT: 38.4 % (ref 36.0–46.0)
Hemoglobin: 12.5 g/dL (ref 12.0–15.0)
MCHC: 32.6 g/dL (ref 30.0–36.0)
MCV: 91 fl (ref 78.0–100.0)
Platelets: 229 10*3/uL (ref 150.0–400.0)
RBC: 4.22 Mil/uL (ref 3.87–5.11)
RDW: 14.2 % (ref 11.5–15.5)
WBC: 8.3 10*3/uL (ref 4.0–10.5)

## 2021-07-31 LAB — TSH: TSH: 0.76 u[IU]/mL (ref 0.35–5.50)

## 2021-07-31 LAB — VITAMIN D 25 HYDROXY (VIT D DEFICIENCY, FRACTURES): VITD: 43.41 ng/mL (ref 30.00–100.00)

## 2021-07-31 LAB — LIPID PANEL
Cholesterol: 127 mg/dL (ref 0–200)
HDL: 45.4 mg/dL (ref >39.00)
LDL Cholesterol: 73 mg/dL (ref 0–99)
NonHDL: 81.16
Total CHOL/HDL Ratio: 3
Triglycerides: 41 mg/dL (ref 0.0–149.0)
VLDL: 8.2 mg/dL (ref 0.0–40.0)

## 2021-07-31 LAB — HEMOGLOBIN A1C: Hgb A1c MFr Bld: 5.7 % (ref 4.6–6.5)

## 2021-07-31 NOTE — Assessment & Plan Note (Signed)
Encouraged DASH or MIND diet, decrease po intake and increase exercise as tolerated. Needs 7-8 hours of sleep nightly. Avoid trans fats, eat small, frequent meals every 4-5 hours with lean proteins, complex carbs and healthy fats. Minimize simple carbs, high fat foods and processed foods 

## 2021-07-31 NOTE — Assessment & Plan Note (Signed)
Encouraged increased hydration and fiber in diet. Daily probiotics. If bowels not moving can use MOM 2 tbls po in 4 oz of warm prune juice by mouth every 2-3 days. If no results then repeat in 4 hours with  Dulcolax suppository pr, may repeat again in 4 more hours as needed. Seek care if symptoms worsen. Consider daily Miralax and/or Dulcolax if symptoms persist.  

## 2021-07-31 NOTE — Assessment & Plan Note (Signed)
Encourage heart healthy diet such as MIND or DASH diet, increase exercise, avoid trans fats, simple carbohydrates and processed foods, consider a krill or fish or flaxseed oil cap daily.  °

## 2021-07-31 NOTE — Progress Notes (Signed)
Subjective:   By signing my name below, I, Anne Barnes, attest that this documentation has been prepared under the direction and in the presence of Mosie Lukes, MD. 07/31/2021     Patient ID: Anne Barnes, female    DOB: 06/08/1946, 75 y.o.   MRN: [id number]  Chief Complaint  Patient presents with   3 months follow up     HPI Patient is in today for an office visit and 3 month f/u.  Her sister passed away recently.  She mentions her allergies are better and the light-headedness has reduced.  She receives her allergy injections every other week and has 1 more year left. The hearing in the right ear is not as good as the left ear because of the mucus in her head. She mentions having mucus for the past 2 and a half years.  She tries to drink 16 ounces of water every morning. She reports gaining a little weight even though she does not eat much. She has not been active since she got Covid-19. She is also trying to manage a healthy diet.   She reports being constipated and normally uses miralax, dulcolax and MOM which helps to move her bowels every other day. She has a history of IBS.  She also uses benefiber everyday.   She would like a referral to a podiatrist for thick toenails and foot pain.  She will receive the flu vaccine today. She has 3 Covid-19 vaccines at this time.   Past Medical History:  Diagnosis Date   Acute combined systolic and diastolic heart failure ([place]) 05/26/2015   ALLERGIC RHINITIS    Allergic urticaria 08/01/2014   Patient reports allergies to shrimp, egg whites, tomatoes, dairy    Allergy    seasonal and numerous food and drug allergies.   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   Office Spirometry 11/07/17-WNL-FVC 1.58/82%, FEV1 1.27/85%, ratio 0.80, FEF 25-75% 0.26/89%   Bradycardia 02/25/2017   Chiari malformation    Noted MRI brain 09/2013 - s/p neuro eval for same   DIVERTICULOSIS, COLON    Essential hypertension 08/28/2010   Qualifier: Diagnosis of   By: Marca Ancona RMA, Lucy      Frequent headaches    GERD    H/O measles    H/O mumps    Hearing loss 01/18/2017   History of chicken pox    Hyperglycemia 03/05/2016   HYPERTENSION    Hypocalcemia 02/25/2017   IBS (irritable bowel syndrome) 05/26/2015   Nonspecific abnormal electrocardiogram (ECG) (EKG)    OBSTRUCTIVE SLEEP APNEA 12/2008 dx   noncompliant with CPAP qhs   Obstructive sleep apnea 08/28/2010   NPSG 12/2008:  AHI 13/hr with desats to 78%    Preventative health care 06/11/2017   Sciatica    right side   Seasonal and perennial allergic rhinitis 08/28/2010   Allergy profile 03/11/14- positive especially for dust, cat and dog Food allergy profile- total IgE 86 with several food group elevations Sed rate-WNL      Sinusitis 06/06/2017   Sleep apnea    uses cpap   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   Vaginitis 01/18/2017   Vitamin D deficiency 06/11/2017    Past Surgical History:  Procedure Laterality Date   [place]   Breast biopsy   COLONOSCOPY  2011   Dr Collene Mares   UMBILICAL HERNIA REPAIR      Family History  Problem Relation Age of Onset   Arthritis  Mother        died of complications from hip surgery   Arthritis Father    Kidney disease Father    Pneumonia Father    Heart disease Father    Lupus Daughter    Rheum arthritis Daughter    Sjogren's syndrome Daughter    Fibromyalgia Daughter    Diabetes Brother    Heart disease Brother    Heart attack Brother    Alcohol abuse Brother    Throat cancer Brother    Lung cancer Brother    Kidney disease Sister        dialysis   Obesity Sister    Hypertension Maternal Grandfather    Hypertension Sister    Kidney disease Sister        dialysis   Pneumonia Brother    Hypertension Daughter    Graves' disease Daughter    Hypertension Daughter    Alcohol abuse Other        parent   Ataxia Neg Hx    [place]orea Neg Hx    Dementia Neg Hx    Mental retardation Neg Hx    Migraines Neg Hx     Multiple sclerosis Neg Hx    Neurofibromatosis Neg Hx    Neuropathy Neg Hx    Parkinsonism Neg Hx    Seizures Neg Hx    Stroke Neg Hx    Colon cancer Neg Hx    Colon polyps Neg Hx    Esophageal cancer Neg Hx    Rectal cancer Neg Hx    Stomach cancer Neg Hx     Social History   Socioeconomic History   Marital status: Widowed    Spouse name: Not on file   Number of children: Not on file   Years of education: Not on file   Highest education level: Not on file  Occupational History   Occupation: retired  Tobacco Use   Smoking status: Never   Smokeless tobacco: Never   Tobacco comments:    Married, lives with spouse, dtr 2 g-kids.  Vaping Use   Vaping Use: Never used  Substance and Sexual Activity   Alcohol use: No    Comment: rare   Drug use: No   Sexual activity: Not Currently    Comment: lives with husband, no dietary restrictions avoid foods allergic to, retired  Other Topics Concern   Not on file  Social History Narrative   Right handed   One story with daughter and two grandkids   Social Determinants of Health   Financial Resource Strain: Low Risk    Difficulty of Paying Living Expenses: Not hard at all  Food Insecurity: No Food Insecurity   Worried About Charity fundraiser in the Last Year: Never true   Arboriculturist in the Last Year: Never true  Transportation Needs: No Transportation Needs   Lack of Transportation (Medical): No   Lack of Transportation (Non-Medical): No  Physical Activity: Sufficiently Active   Days of Exercise per Week: 7 days   Minutes of Exercise per Session: 40 min  Stress: No Stress Concern Present   Feeling of Stress : Not at all  Social Connections: Moderately Isolated   Frequency of Communication with Friends and Family: More than three times a week   Frequency of Social Gatherings with Friends and Family: More than three times a week   Attends Religious Services: More than 4 times per year   Active Member of [employer] or  Organizations:  No   Attends Archivist Meetings: Never   Marital Status: Widowed  Human resources officer Violence: Not At Risk   Fear of Current or Ex-Partner: No   Emotionally Abused: No   Physically Abused: No   Sexually Abused: No    Outpatient Medications Prior to Visit  Medication Sig Dispense Refill   albuterol (VENTOLIN HFA) 108 (90 Base) MCG/ACT inhaler INHALE 2 PUFFS INTO THE LUNGS EVERY 6 HOURS AS NEEDED FOR WHEEZING FOR SHORTNESS OF BREATH 18 g 1   ALPRAZolam (XANAX) 0.5 MG tablet TAKE 1 TABLET BY MOUTH TWICE DAILY AS NEEDED FOR ANXIETY AND FOR SLEEP 20 tablet 1   amLODipine (NORVASC) 2.5 MG tablet Take 1 tablet by mouth twice daily 180 tablet 3   aspirin EC 81 MG tablet Take 81 mg by mouth daily. Swallow whole.     Azelastine HCl 137 MCG/SPRAY SOLN USE 2 SPRAY(S) IN EACH NOSTRIL TWICE DAILY 30 mL 2   b complex vitamins tablet Take 1 tablet by mouth daily.     Cholecalciferol (VITAMIN D) 2000 UNITS CAPS Take by mouth daily.     EPINEPHrine 0.3 mg/0.3 mL IJ SOAJ injection Inject 0.3 mg into the muscle as needed for anaphylaxis. 2 each 1   fluticasone (FLONASE) 50 MCG/ACT nasal spray Place 2 sprays into both nostrils daily. 16 g 6   furosemide (LASIX) 20 MG tablet Take 1 tablet (20 mg total) by mouth 3 (three) times a week. Monday, Wednesday and Friday. 90 tablet 3   levocetirizine (XYZAL) 5 MG tablet TAKE 1 TABLET BY MOUTH ONCE DAILY IN THE EVENING 30 tablet 5   losartan (COZAAR) 100 MG tablet Take 1 tablet by mouth once daily 90 tablet 1   montelukast (SINGULAIR) 10 MG tablet TAKE 1 TABLET BY MOUTH AT BEDTIME 90 tablet 1   Multiple Vitamin (MULTIVITAMIN) tablet Take 1 tablet by mouth daily.     olopatadine (PATANOL) 0.1 % ophthalmic solution Place 1 drop into both eyes 2 (two) times daily. 5 mL 5   omeprazole (PRILOSEC) 20 MG capsule Take 1 capsule by mouth once daily 90 capsule 0   promethazine-dextromethorphan (PROMETHAZINE-DM) 6.25-15 MG/5ML syrup Take 2.5-5 mLs by mouth  3 (three) times daily as needed for cough. 180 mL 1   rosuvastatin (CRESTOR) 10 MG tablet Take 1 tablet by mouth once daily 90 tablet 3   spironolactone (ALDACTONE) 25 MG tablet Take 1 tablet (25 mg total) by mouth daily. 90 tablet 3   tiZANidine (ZANAFLEX) 2 MG tablet TAKE 1/2 TO 2 TABLETS BY MOUTH TWICE DAILY AS NEEDED FOR MUSCLE SPASM 30 tablet 0   VITAMIN D, CHOLECALCIFEROL, PO Take by mouth.     No facility-administered medications prior to visit.    Allergies  Allergen Reactions   Codeine Nausea And Vomiting   Eggs Or Egg-Derived Products    Hydralazine Hcl Other (See Comments)    Pt reports causes dizziness   Lisinopril-Hydrochlorothiazide Hives    REACTION: lip swelling,facial swelling,rash.   Metoprolol Other (See Comments)    Does not feel well on it    Other Cough    Patient states she is allergic to Shrimp, egg whites, tomatoes and dairy products.  Says they make her feel unwell but do not cause hives, SOB or anaphylaxis.  She just avoids eating them because they can make her have a headache.  Strawberries, watermelon, peanuts, and all shellfish.     Shrimp [Shellfish Allergy] Other (See Comments)    Was allergy tested  and showed shrimp was one--has not reacted b/c avoided   Tomato Other (See Comments)    Review of Systems  Constitutional:  Negative for fever and malaise/fatigue.  HENT:  Negative for congestion.   Eyes:  Negative for redness.  Respiratory:  Negative for shortness of breath.   Cardiovascular:  Negative for chest pain, palpitations and leg swelling.  Gastrointestinal:  Positive for constipation. Negative for abdominal pain, blood in stool and nausea.  Genitourinary:  Negative for dysuria and frequency.  Musculoskeletal:  Negative for falls.  Skin:  Negative for rash.  Neurological:  Negative for dizziness, loss of consciousness and headaches.  Endo/Heme/Allergies:  Negative for polydipsia.  Psychiatric/Behavioral:  Negative for depression. The patient  is not nervous/anxious.       Objective:    Physical Exam Constitutional:      General: She is not in acute distress.    Appearance: She is well-developed.  HENT:     Head: Normocephalic and atraumatic.  Eyes:     Conjunctiva/sclera: Conjunctivae normal.  Neck:     Thyroid: No thyromegaly.  Cardiovascular:     Rate and Rhythm: Normal rate and regular rhythm.     Heart sounds: Normal heart sounds. No murmur heard. Pulmonary:     Effort: Pulmonary effort is normal. No respiratory distress.     Breath sounds: Normal breath sounds.  Abdominal:     General: Bowel sounds are normal. There is no distension.     Palpations: Abdomen is soft. There is no mass.     Tenderness: There is no abdominal tenderness.  Musculoskeletal:     Cervical back: Neck supple.  Feet:     Right foot:     Toenail Condition: Right toenails are abnormally thick.     Left foot:     Toenail Condition: Left toenails are abnormally thick.  Lymphadenopathy:     Cervical: No cervical adenopathy.  Skin:    General: Skin is warm and dry.  Neurological:     Mental Status: She is alert and oriented to person, place, and time.  Psychiatric:        Behavior: Behavior normal.    BP 116/68   Pulse 70   Temp 98.1 F (36.7 C)   Resp 16   Wt 233 lb 6.4 oz (105.9 kg)   SpO2 98%   BMI 47.14 kg/m  Wt Readings from Last 3 Encounters:  07/31/21 233 lb 6.4 oz (105.9 kg)  05/17/21 228 lb 4 oz (103.5 kg)  04/28/21 225 lb 6.4 oz (102.2 kg)    Diabetic Foot Exam - Simple   No data filed    Lab Results  Component Value Date   WBC 8.3 07/31/2021   HGB 12.5 07/31/2021   HCT 38.4 07/31/2021   PLT 229.0 07/31/2021   GLUCOSE 78 07/31/2021   CHOL 127 07/31/2021   TRIG 41.0 07/31/2021   HDL 45.40 07/31/2021   LDLCALC 73 07/31/2021   ALT 30 07/31/2021   AST 20 07/31/2021   NA 142 07/31/2021   K 4.1 07/31/2021   CL 105 07/31/2021   CREATININE 0.80 07/31/2021   BUN 18 07/31/2021   CO2 30 07/31/2021   TSH  0.76 07/31/2021   INR 1.06 01/10/2015   HGBA1C 5.7 07/31/2021    Lab Results  Component Value Date   TSH 0.76 07/31/2021   Lab Results  Component Value Date   WBC 8.3 07/31/2021   HGB 12.5 07/31/2021   HCT 38.4 07/31/2021   MCV  91.0 07/31/2021   PLT 229.0 07/31/2021   Lab Results  Component Value Date   NA 142 07/31/2021   K 4.1 07/31/2021   CO2 30 07/31/2021   GLUCOSE 78 07/31/2021   BUN 18 07/31/2021   CREATININE 0.80 07/31/2021   BILITOT 0.5 07/31/2021   ALKPHOS 87 07/31/2021   AST 20 07/31/2021   ALT 30 07/31/2021   PROT 6.9 07/31/2021   ALBUMIN 3.9 07/31/2021   CALCIUM 8.8 07/31/2021   ANIONGAP 9 09/15/2017   EGFR 61 05/12/2021   GFR 72.86 07/31/2021   Lab Results  Component Value Date   CHOL 127 07/31/2021   Lab Results  Component Value Date   HDL 45.40 07/31/2021   Lab Results  Component Value Date   LDLCALC 73 07/31/2021   Lab Results  Component Value Date   TRIG 41.0 07/31/2021   Lab Results  Component Value Date   CHOLHDL 3 07/31/2021   Lab Results  Component Value Date   HGBA1C 5.7 07/31/2021       Assessment & Plan:   Problem List Items Addressed This Visit     Essential hypertension    Well controlled, no changes to meds. Encouraged heart healthy diet such as the DASH diet and exercise as tolerated.       Relevant Orders   CBC (Completed)   Comprehensive metabolic panel (Completed)   TSH (Completed)   Seasonal and perennial allergic rhinitis    She is working with allergist and is now down to an allergy shot every other week and they expect her to do shots for at least the next year.       IBS (irritable bowel syndrome)    Encouraged increased hydration and fiber in diet. Daily probiotics. If bowels not moving can use MOM 2 tbls po in 4 oz of warm prune juice by mouth every 2-3 days. If no results then repeat in 4 hours with  Dulcolax suppository pr, may repeat again in 4 more hours as needed. Seek care if symptoms worsen.  Consider daily Miralax and/or Dulcolax if symptoms persist.       Hyperglycemia    hgba1c acceptable, minimize simple carbs. Increase exercise as tolerated.       Relevant Orders   Hemoglobin A1c (Completed)   Vitamin D deficiency    Supplement and monitor      Relevant Orders   VITAMIN D 25 Hydroxy (Vit-D Deficiency, Fractures) (Completed)   Morbid obesity ([place])    Encouraged DASH or MIND diet, decrease po intake and increase exercise as tolerated. Needs 7-8 hours of sleep nightly. Avoid trans fats, eat small, frequent meals every 4-5 hours with lean proteins, complex carbs and healthy fats. Minimize simple carbs, high fat foods and processed foods      Hyperlipidemia    Encourage heart healthy diet such as MIND or DASH diet, increase exercise, avoid trans fats, simple carbohydrates and processed foods, consider a krill or fish or flaxseed oil cap daily.       Relevant Orders   Lipid panel (Completed)   Other Visit Diagnoses     Foot pain, bilateral    -  Primary   Relevant Orders   Ambulatory referral to Podiatry   Need for influenza vaccination       Relevant Orders   Flu Vaccine QUAD High Dose(Fluad) (Completed)       No orders of the defined types were placed in this encounter.   I,Anne Barnes,acting as a Education administrator for  Penni Homans, MD.,have documented all relevant documentation on the behalf of Penni Homans, MD,as directed by  Penni Homans, MD while in the presence of Penni Homans, MD.   I, Mosie Lukes, MD., personally preformed the services described in this documentation.  All medical record entries made by the scribe were at my direction and in my presence.  I have reviewed the chart and discharge instructions (if applicable) and agree that the record reflects my personal performance and is accurate and complete. 07/31/2021

## 2021-07-31 NOTE — Assessment & Plan Note (Signed)
She is working with allergist and is now down to an allergy shot every other week and they expect her to do shots for at least the next year.

## 2021-07-31 NOTE — Assessment & Plan Note (Signed)
Well controlled, no changes to meds. Encouraged heart healthy diet such as the DASH diet and exercise as tolerated.  °

## 2021-07-31 NOTE — Assessment & Plan Note (Signed)
Supplement and monitor 

## 2021-07-31 NOTE — Assessment & Plan Note (Signed)
hgba1c acceptable, minimize simple carbs. Increase exercise as tolerated.  

## 2021-07-31 NOTE — Patient Instructions (Addendum)
MIND diet  Wegovy and Saxenda injectable medications for weight loss, check with your insurance after first of the year and see if they are offering coverage we can prescribe  Bone density shows osteopenia and osteoporosis which is thinner than normal but not as bad as osteoporosis. Recommend calcium intake of 1200 to 1500 mg daily, divided into roughly 3 doses. Best source is the diet and a single dairy serving is about 500 mg, a supplement of calcium citrate once or twice daily to balance diet is fine if not getting enough in diet. Also need Vitamin D 2000 IU caps, 1 cap daily if not already taking vitamin D. Also recommend weight baring exercise on hips and upper body to keep bones strong   Encouraged increased hydration and fiber in diet. Daily probiotics. If bowels not moving can use MOM 2 tbls po in 4 oz of warm prune juice by mouth every 2-3 days. If no results then repeat in 4 hours with  Dulcolax suppository pr, may repeat again in 4 more hours as needed. Seek care if symptoms worsen. Consider daily Miralax and/or Dulcolax if symptoms persist.   Mix Benefiber and MIralax together can take daily and twice daily or every other day   Osteoporosis Osteoporosis happens when the bones become thin and less dense than normal. Osteoporosis makes bones more brittle and fragile and more likely to break (fracture). Over time, osteoporosis can cause your bones to become so weak that they fracture after a minor fall. Bones in the hip, wrist, and spine are most likely to fracture due to osteoporosis. What are the causes? The exact cause of this condition is not known. What increases the risk? You are more likely to develop this condition if you: Have family members with this condition. Have poor nutrition. Use the following: Steroid medicines, such as prednisone. Anti-seizure medicines. Nicotine or tobacco, such as cigarettes, e-cigarettes, and chewing tobacco. Are female. Are age 75 or older. Are  not physically active (are sedentary). Are of European or Asian descent. Have a small body frame. What are the signs or symptoms? A fracture might be the first sign of osteoporosis, especially if the fracture results from a fall or injury that usually would not cause a bone to break. Other signs and symptoms include: Pain in the neck or low back. Stooped posture. Loss of height. How is this diagnosed? This condition may be diagnosed based on: Your medical history. A physical exam. A bone mineral density test, also called a DXA or DEXA test (dual-energy X-ray absorptiometry test). This test uses X-rays to measure the amount of minerals in your bones. How is this treated? This condition may be treated by: Making lifestyle changes, such as: Including foods with more calcium and vitamin D in your diet. Doing weight-bearing and muscle-strengthening exercises. Stopping tobacco use. Limiting alcohol intake. Taking medicine to slow the process of bone loss or to increase bone density. Taking daily supplements of calcium and vitamin D. Taking hormone replacement medicines, such as estrogen for women and testosterone for men. Monitoring your levels of calcium and vitamin D. The goal of treatment is to strengthen your bones and lower your risk for a fracture. Follow these instructions at home: Eating and drinking Include calcium and vitamin D in your diet. Calcium is important for bone health, and vitamin D helps your body absorb calcium. Good sources of calcium and vitamin D include: Certain fatty fish, such as salmon and tuna. Products that have calcium and vitamin D added  to them (are fortified), such as fortified cereals. Egg yolks. Cheese. Liver.  Activity Do exercises as told by your health care provider. Ask your health care provider what exercises and activities are safe for you. You should do: Exercises that make you work against gravity (weight-bearing exercises), such as tai chi,  yoga, or walking. Exercises to strengthen muscles, such as lifting weights. Lifestyle Do not drink alcohol if: Your health care provider tells you not to drink. You are pregnant, may be pregnant, or are planning to become pregnant. If you drink alcohol: Limit how much you use to: 0-1 drink a day for women. 0-2 drinks a day for men. Know how much alcohol is in your drink. In the [place] one drink equals one 12 oz bottle of beer (355 mL), one 5 oz glass of wine (148 mL), or one 1 oz glass of hard liquor (44 mL). Do not use any products that contain nicotine or tobacco, such as cigarettes, e-cigarettes, and chewing tobacco. If you need help quitting, ask your health care provider. Preventing falls Use devices to help you move around (mobility aids) as needed, such as canes, walkers, scooters, or crutches. Keep rooms well-lit and clutter-free. Remove tripping hazards from walkways, including cords and throw rugs. Install grab bars in bathrooms and safety rails on stairs. Use rubber mats in the bathroom and other areas that are often wet or slippery. Wear closed-toe shoes that fit well and support your feet. Wear shoes that have rubber soles or low heels. Review your medicines with your health care provider. Some medicines can cause dizziness or changes in blood pressure, which can increase your risk of falling. General instructions Take over-the-counter and prescription medicines only as told by your health care provider. Keep all follow-up visits. This is important. Contact a health care provider if: You have never been screened for osteoporosis and you are: A woman who is age 52 or older. A man who is age 42 or older. Get help right away if: You fall or injure yourself. Summary Osteoporosis is thinning and loss of density in your bones. This makes bones more brittle and fragile and more likely to break (fracture),even with minor falls. The goal of treatment is to strengthen your bones and  lower your risk for a fracture. Include calcium and vitamin D in your diet. Calcium is important for bone health, and vitamin D helps your body absorb calcium. Talk with your health care provider about screening for osteoporosis if you are a woman who is age 70 or older, or a man who is age 52 or older. This information is not intended to replace advice given to you by your health care provider. Make sure you discuss any questions you have with your health care provider. Document Revised: 02/25/2020 Document Reviewed: 02/25/2020 Elsevier Patient Education  [place].

## 2021-08-09 ENCOUNTER — Ambulatory Visit (INDEPENDENT_AMBULATORY_CARE_PROVIDER_SITE_OTHER): Payer: Medicare HMO

## 2021-08-09 DIAGNOSIS — J309 Allergic rhinitis, unspecified: Secondary | ICD-10-CM | POA: Diagnosis not present

## 2021-08-14 ENCOUNTER — Other Ambulatory Visit: Payer: Self-pay

## 2021-08-14 ENCOUNTER — Ambulatory Visit (INDEPENDENT_AMBULATORY_CARE_PROVIDER_SITE_OTHER): Payer: Medicare HMO

## 2021-08-14 ENCOUNTER — Ambulatory Visit: Payer: Medicare HMO | Admitting: Podiatry

## 2021-08-14 DIAGNOSIS — M79675 Pain in left toe(s): Secondary | ICD-10-CM

## 2021-08-14 DIAGNOSIS — M7752 Other enthesopathy of left foot: Secondary | ICD-10-CM

## 2021-08-14 DIAGNOSIS — B351 Tinea unguium: Secondary | ICD-10-CM

## 2021-08-14 DIAGNOSIS — M79671 Pain in right foot: Secondary | ICD-10-CM

## 2021-08-14 DIAGNOSIS — M7751 Other enthesopathy of right foot: Secondary | ICD-10-CM

## 2021-08-14 DIAGNOSIS — M79674 Pain in right toe(s): Secondary | ICD-10-CM

## 2021-08-14 NOTE — Patient Instructions (Signed)
For instructions on how to put on your Tri-Lock Ankle Brace, please visit [email or web address] 

## 2021-08-20 NOTE — Progress Notes (Signed)
Subjective:   Patient ID: Anne Barnes, female   DOB: 75 y.o.   MRN: [id number]   HPI 75 year old female presents the office today for concerns of bilateral big toe pain, mostly on the nails that she also describes discomfort in the left ankle.  She was in her right big toenail removed about 15 years ago and it came back thick and dark afterwards.  She is tried over-the-counter medication without any significant improvement.  No gross ankle pain is intermittent.  No recent injury that she reports.  No increase in swelling.  Hurts mostly with prolonged walking.   Review of Systems  All other systems reviewed and are negative.  Past Medical History:  Diagnosis Date   Acute combined systolic and diastolic heart failure ([place]) 05/26/2015   ALLERGIC RHINITIS    Allergic urticaria 08/01/2014   Patient reports allergies to shrimp, egg whites, tomatoes, dairy    Allergy    seasonal and numerous food and drug allergies.   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   Office Spirometry 11/07/17-WNL-FVC 1.58/82%, FEV1 1.27/85%, ratio 0.80, FEF 25-75% 0.26/89%   Bradycardia 02/25/2017   Chiari malformation    Noted MRI brain 09/2013 - s/p neuro eval for same   DIVERTICULOSIS, COLON    Essential hypertension 08/28/2010   Qualifier: Diagnosis of  By: Marca Ancona RMA, Lucy      Frequent headaches    GERD    H/O measles    H/O mumps    Hearing loss 01/18/2017   History of chicken pox    Hyperglycemia 03/05/2016   HYPERTENSION    Hypocalcemia 02/25/2017   IBS (irritable bowel syndrome) 05/26/2015   Nonspecific abnormal electrocardiogram (ECG) (EKG)    OBSTRUCTIVE SLEEP APNEA 12/2008 dx   noncompliant with CPAP qhs   Obstructive sleep apnea 08/28/2010   NPSG 12/2008:  AHI 13/hr with desats to 78%    Preventative health care 06/11/2017   Sciatica    right side   Seasonal and perennial allergic rhinitis 08/28/2010   Allergy profile 03/11/14- positive especially for dust, cat and dog Food allergy profile- total IgE 86 with  several food group elevations Sed rate-WNL      Sinusitis 06/06/2017   Sleep apnea    uses cpap   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   Vaginitis 01/18/2017   Vitamin D deficiency 06/11/2017    Past Surgical History:  Procedure Laterality Date   [place]   Breast biopsy   COLONOSCOPY  2011   Dr Collene Mares   UMBILICAL HERNIA REPAIR       Current Outpatient Medications:    albuterol (VENTOLIN HFA) 108 (90 Base) MCG/ACT inhaler, INHALE 2 PUFFS INTO THE LUNGS EVERY 6 HOURS AS NEEDED FOR WHEEZING FOR SHORTNESS OF BREATH, Disp: 18 g, Rfl: 1   ALPRAZolam (XANAX) 0.5 MG tablet, TAKE 1 TABLET BY MOUTH TWICE DAILY AS NEEDED FOR ANXIETY AND FOR SLEEP, Disp: 20 tablet, Rfl: 1   amLODipine (NORVASC) 2.5 MG tablet, Take 1 tablet by mouth twice daily, Disp: 180 tablet, Rfl: 3   aspirin EC 81 MG tablet, Take 81 mg by mouth daily. Swallow whole., Disp: , Rfl:    Azelastine HCl 137 MCG/SPRAY SOLN, USE 2 SPRAY(S) IN EACH NOSTRIL TWICE DAILY, Disp: 30 mL, Rfl: 2   b complex vitamins tablet, Take 1 tablet by mouth daily., Disp: , Rfl:    Cholecalciferol (VITAMIN D) 2000 UNITS CAPS, Take by mouth daily., Disp: , Rfl:  EPINEPHrine 0.3 mg/0.3 mL IJ SOAJ injection, Inject 0.3 mg into the muscle as needed for anaphylaxis., Disp: 2 each, Rfl: 1   fluticasone (FLONASE) 50 MCG/ACT nasal spray, Place 2 sprays into both nostrils daily., Disp: 16 g, Rfl: 6   furosemide (LASIX) 20 MG tablet, Take 1 tablet (20 mg total) by mouth 3 (three) times a week. Monday, Wednesday and Friday., Disp: 90 tablet, Rfl: 3   levocetirizine (XYZAL) 5 MG tablet, TAKE 1 TABLET BY MOUTH ONCE DAILY IN THE EVENING, Disp: 30 tablet, Rfl: 5   losartan (COZAAR) 100 MG tablet, Take 1 tablet by mouth once daily, Disp: 90 tablet, Rfl: 1   montelukast (SINGULAIR) 10 MG tablet, TAKE 1 TABLET BY MOUTH AT BEDTIME, Disp: 90 tablet, Rfl: 1   Multiple Vitamin (MULTIVITAMIN) tablet, Take 1 tablet by mouth daily., Disp: ,  Rfl:    olopatadine (PATANOL) 0.1 % ophthalmic solution, Place 1 drop into both eyes 2 (two) times daily., Disp: 5 mL, Rfl: 5   omeprazole (PRILOSEC) 20 MG capsule, Take 1 capsule by mouth once daily, Disp: 90 capsule, Rfl: 0   promethazine-dextromethorphan (PROMETHAZINE-DM) 6.25-15 MG/5ML syrup, Take 2.5-5 mLs by mouth 3 (three) times daily as needed for cough., Disp: 180 mL, Rfl: 1   rosuvastatin (CRESTOR) 10 MG tablet, Take 1 tablet by mouth once daily, Disp: 90 tablet, Rfl: 3   spironolactone (ALDACTONE) 25 MG tablet, Take 1 tablet (25 mg total) by mouth daily., Disp: 90 tablet, Rfl: 3   tiZANidine (ZANAFLEX) 2 MG tablet, TAKE 1/2 TO 2 TABLETS BY MOUTH TWICE DAILY AS NEEDED FOR MUSCLE SPASM, Disp: 30 tablet, Rfl: 0   VITAMIN D, CHOLECALCIFEROL, PO, Take by mouth., Disp: , Rfl:   Allergies  Allergen Reactions   Codeine Nausea And Vomiting   Eggs Or Egg-Derived Products    Hydralazine Hcl Other (See Comments)    Pt reports causes dizziness   Lisinopril-Hydrochlorothiazide Hives    REACTION: lip swelling,facial swelling,rash.   Metoprolol Other (See Comments)    Does not feel well on it    Other Cough    Patient states she is allergic to Shrimp, egg whites, tomatoes and dairy products.  Says they make her feel unwell but do not cause hives, SOB or anaphylaxis.  She just avoids eating them because they can make her have a headache.  Strawberries, watermelon, peanuts, and all shellfish.     Shrimp [Shellfish Allergy] Other (See Comments)    Was allergy tested and showed shrimp was one--has not reacted b/c avoided   Tomato Other (See Comments)         Objective:  Physical Exam  General: AAO x3, NAD  Dermatological: The majority tenderness appears to be around the hallux toenails.  The nails are hypertrophic, dystrophic and discolored with brown discoloration.  There is no edema, erythema.  There is no open lesions.  Vascular: Dorsalis Pedis artery and Posterior Tibial artery pedal  pulses are palpable bilateral with immedate capillary fill time. There is no pain with calf compression, swelling, warmth, erythema.   Neruologic: Grossly intact via light touch bilateral.  Sensation intact with Semmes Weinstein monofilament  Musculoskeletal: No area pinpoint tenderness identified today.  Ankle, subtalar range of motion intact.  Decreased medial arch upon weightbearing.  Muscular strength 5/5 in all groups tested bilateral.  Gait: Unassisted, Nonantalgic.       Assessment:   75 year old female with symptomatic onychomycosis, ankle capsulitis left side     Plan:  -Treatment options discussed including  all alternatives, risks, and complications -Etiology of symptoms were discussed -X-rays were obtained and reviewed with the patient.  No subacute fracture or stress fracture identified today.  Mild arthritic changes present in the medial gutter of the ankle joint.  Decreased calcaneal inclination angle. -Pain her toes are more to the toenails.  Sharply debrided the nails without any complications or bleeding x2 to the big toenails as a courtesy to the other nails but they were not significantly elongated today. -Regards to ankle pain dispensed a Tri-Lock ankle brace to use as needed.  Discussed shoes and good arch support.  Discussed gentle range of motion exercises to be performed at home as well.  Return in about 3 months (around 11/14/2021).  Trula Slade DPM

## 2021-08-23 NOTE — Progress Notes (Signed)
HPI female never smoker followed for rhinitis/allergies, asthma, OSA, CHF, HTN, GERD, IBS, Peanut and cat cause coughing and we advised her to avoid them Allergy profile 03/11/14- positive especially for dust, cat and dog Food allergy profile- total IgE 86 with several food group elevations Sed rate-WNL Office Spirometry 11/07/17-WNL-FVC 1.58/82%, FEV1 1.27/85%, ratio 0.80, FEF 25-75% 0.26/89% HST 12/05/2017-AHI 12.3/hour, desaturation to 82%, body weight 226 pounds ----------------------------------------------------------------------------------------   06/08/20-  75 year old female never smoker followed for Rhinitis/Allergies, Asthma, OSA,  Complicated by CHF, HTN, GERD, IBS, Chiari Malformation,  CPAP auto 5-15/ [facility]/ [employer] Download- compliance 63%, AHI 2.6/ hr   Many short nights Body weight today- 223 lbs Covid vax- 2 Moderna -----asthma,osa, mucus build up causes sob, using cpap nightly, denies cpap problems Singulair, Ventolin hfa, Xhance nasal or Flonase, Astelin,  Her allergist is about to start allergy shots for "Mucus draining down the left side of throat". She describes choking when swallowing water, which brings up clear mucus. Need to watch for LPR/ penetration. Takes off her CPAP in her sleep. If up to bathjroom she puts it back on. Denies physically restless sleep. Wears CPAP for daily nap. Noted BP up on arrival 160/72. Says she was in hurry and skipped meds. Has cardiology appt this afternoon.  CT chest 01/26/20-  IMPRESSION: 1. Right lower lobe opacities noted on prior exam are not visualized currently, most consistent with resolved inflammation. 2. Mild coronary artery calcifications are noted suggesting coronary artery disease. 3. Small sliding-type hiatal hernia. 4. Bilateral thyroid nodules are noted, the largest measuring 1.3 cm on the right and 11 mm on the left. Not clinically significant; no follow-up imaging recommended. (Ref: J Am Coll Radiol.  2015 Feb;12(2): 143-50). Aortic Atherosclerosis (ICD10-I70.0).  08/24/21- 75 year old female never smoker followed for  OSA,  Complicated by CHF, HTN, GERD, IBS, Chiari Malformation,  Rhinitis/Allergies, Asthma, CPAP auto 5-15/ [facility]/ [employer]                 Allergy [facility] Download- compliance 93%, AHI 4/ hr Body weight today- -----Albuterol hfa, singulair,  Covid vax- 3 Phizer Flu vax-had She had COVID infection in July with increased wheeze and shortness of breath since then.  Continues to be followed by her allergist.  She is very reluctant to use any medication she can avoid as a matter of principal but does have an albuterol rescue inhaler.  ROS-see HPI  + = positive Constitutional:   No-   weight loss, night sweats, fevers, chills, fatigue, lassitude. HEENT:   +headaches, difficulty swallowing, tooth/dental problems, sore throat,       No-  sneezing, itching, ear ache, nasal congestion, +post nasal drip,  CV:  No-   chest pain, orthopnea, PND, swelling in lower extremities, anasarca,  +dizziness, palpitations Resp: +shortness of breath with exertion or at rest.              productive cough,  No non-productive cough,  No- coughing up of blood.              No-   change in color of mucus.  No- wheezing.   Skin: No-   rash or lesions. GI:  No-   heartburn, indigestion, abdominal pain, nausea, vomiting,  GU: . MS:  No-   joint pain or swelling.   Neuro-     nothing unusual Psych:  No- change in mood or affect. No depression or anxiety.  No memory loss.  OBJ- Physical Exam General- Alert, Oriented,  Affect-appropriate, Distress- none acute, + obese Skin- rash-none, lesions- none, excoriation- none Lymphadenopathy- none Head- atraumatic            Eyes- Gross vision intact, PERRLA, conjunctivae and secretions clear            Ears- Hearing, canals-normal            Nose- + turbinate edema, no-Septal dev, mucus, polyps, erosion, perforation              Throat- Mallampati III-IV , mucosa clear , drainage- none, tonsils- atrophic, +Dentures Neck- flexible , trachea midline, no stridor , thyroid nl, carotid no bruit Chest - symmetrical excursion , unlabored           Heart/CV- RRR , no murmur , no gallop  , no rub, nl s1 s2                           - JVD- none , edema- none, stasis changes- none, varices- none           Lung- wheeze+unlabored, cough none, dullness-none, rub- none           Chest wall-  Abd- Br/ Gen/ Rectal- Not done, not indicated Extrem- cyanosis- none, clubbing, none, atrophy- none, strength- nl Neuro- grossly intact to observation

## 2021-08-24 ENCOUNTER — Other Ambulatory Visit: Payer: Self-pay

## 2021-08-24 ENCOUNTER — Ambulatory Visit: Payer: Medicare HMO | Admitting: Internal Medicine

## 2021-08-24 ENCOUNTER — Ambulatory Visit (INDEPENDENT_AMBULATORY_CARE_PROVIDER_SITE_OTHER): Payer: Medicare HMO

## 2021-08-24 ENCOUNTER — Encounter: Payer: Self-pay | Admitting: Internal Medicine

## 2021-08-24 VITALS — BP 132/78 | HR 95 | Temp 97.8°F | Ht 59.0 in | Wt 237.8 lb

## 2021-08-24 DIAGNOSIS — J4521 Mild intermittent asthma with (acute) exacerbation: Secondary | ICD-10-CM

## 2021-08-24 DIAGNOSIS — J4530 Mild persistent asthma, uncomplicated: Secondary | ICD-10-CM | POA: Diagnosis not present

## 2021-08-24 DIAGNOSIS — G4733 Obstructive sleep apnea (adult) (pediatric): Secondary | ICD-10-CM

## 2021-08-24 DIAGNOSIS — I517 Cardiomegaly: Secondary | ICD-10-CM | POA: Diagnosis not present

## 2021-08-24 DIAGNOSIS — J45909 Unspecified asthma, uncomplicated: Secondary | ICD-10-CM | POA: Diagnosis not present

## 2021-08-24 NOTE — Assessment & Plan Note (Signed)
Benefits from CPAP with good compliance and control Plan- continue CPAP auto 5-15 

## 2021-08-24 NOTE — Patient Instructions (Addendum)
We can continue CPAP auto 5-15  Order- CXR  dx asthma exacerbation post Covid  Order-  depo 80      dx asthma exacerbation  Please follow up with your primary doctor or your allergy doctor if you keep wheezing, so they can help you feel better.

## 2021-08-24 NOTE — Assessment & Plan Note (Signed)
Asthma exacerbation with wheezing and shortness of breath that she relates to her COVID infection in July. Plan-Depo-Medrol 80 mg, chest x-ray, follow-up with her allergist for better control.

## 2021-08-30 ENCOUNTER — Other Ambulatory Visit: Payer: Self-pay | Admitting: Family Medicine

## 2021-08-31 ENCOUNTER — Ambulatory Visit (INDEPENDENT_AMBULATORY_CARE_PROVIDER_SITE_OTHER): Payer: Medicare HMO

## 2021-08-31 DIAGNOSIS — J309 Allergic rhinitis, unspecified: Secondary | ICD-10-CM

## 2021-09-05 NOTE — Progress Notes (Signed)
Tried calling the pt and there was no answer and no option to leave msg, WCB.  

## 2021-09-05 NOTE — Progress Notes (Signed)
EXP 09/07/22

## 2021-09-07 DIAGNOSIS — J3089 Other allergic rhinitis: Secondary | ICD-10-CM | POA: Diagnosis not present

## 2021-09-12 ENCOUNTER — Encounter: Payer: Self-pay | Admitting: *Deleted

## 2021-09-13 ENCOUNTER — Ambulatory Visit (INDEPENDENT_AMBULATORY_CARE_PROVIDER_SITE_OTHER): Payer: Medicare HMO

## 2021-09-13 DIAGNOSIS — J309 Allergic rhinitis, unspecified: Secondary | ICD-10-CM

## 2021-09-21 ENCOUNTER — Ambulatory Visit (INDEPENDENT_AMBULATORY_CARE_PROVIDER_SITE_OTHER): Payer: Medicare HMO | Admitting: Allergy & Immunology

## 2021-09-21 ENCOUNTER — Ambulatory Visit: Payer: Self-pay | Admitting: *Deleted

## 2021-09-21 ENCOUNTER — Encounter: Payer: Self-pay | Admitting: Allergy & Immunology

## 2021-09-21 ENCOUNTER — Other Ambulatory Visit: Payer: Self-pay

## 2021-09-21 VITALS — BP 150/76 | HR 82 | Temp 98.4°F | Ht 59.0 in | Wt 235.6 lb

## 2021-09-21 DIAGNOSIS — J4520 Mild intermittent asthma, uncomplicated: Secondary | ICD-10-CM | POA: Diagnosis not present

## 2021-09-21 DIAGNOSIS — T7800XA Anaphylactic reaction due to unspecified food, initial encounter: Secondary | ICD-10-CM

## 2021-09-21 DIAGNOSIS — K219 Gastro-esophageal reflux disease without esophagitis: Secondary | ICD-10-CM | POA: Diagnosis not present

## 2021-09-21 DIAGNOSIS — J309 Allergic rhinitis, unspecified: Secondary | ICD-10-CM

## 2021-09-21 DIAGNOSIS — J302 Other seasonal allergic rhinitis: Secondary | ICD-10-CM

## 2021-09-21 MED ORDER — EPINEPHRINE 0.3 MG/0.3ML IJ SOAJ: 0.3000 mg | INTRAMUSCULAR | 1 refills | Status: DC | PRN

## 2021-09-21 MED ORDER — FAMOTIDINE 40 MG PO TABS: 40.0000 mg | ORAL_TABLET | Freq: Two times a day (BID) | ORAL | 1 refills | Status: DC

## 2021-09-21 MED ORDER — AZELASTINE HCL 0.1 % NA SOLN: 2.0000 | Freq: Two times a day (BID) | NASAL | 5 refills | Status: DC | PRN

## 2021-09-21 MED ORDER — MONTELUKAST SODIUM 10 MG PO TABS
10.0000 mg | ORAL_TABLET | Freq: Every day | ORAL | 1 refills | Status: DC
Start: 2021-09-21 — End: 2022-06-04

## 2021-09-21 MED ORDER — OMEPRAZOLE 20 MG PO CPDR: 20.0000 mg | DELAYED_RELEASE_CAPSULE | Freq: Every day | ORAL | 1 refills | Status: DC

## 2021-09-21 NOTE — Progress Notes (Signed)
FOLLOW UP  Date of Service/Encounter:  09/21/21   Assessment:   Allergic rhinitis  GERD - recommended restarting her reflux medications today  Multiple food allergies (gluten, shellfish, soy, peanut) - with negative testing in the past  Mild intermittent asthma, uncomplicated   Plan/Recommendations:   Postnasal drip - likely related to uncontrolled reflux (worsens at night) - COVID testing was negative. - Symptoms were already better by the time I saw you in the exam room. - Continue with Astelin up to twice daily for the postnasal drip. - Add on Mucinex [phone] mg twice daily for one week to help with breaking up the mucous.  - Restart omeprazole 20mg  daily (to help with reflux) - Add on famotidine 40mg  twice daily to help control reflux even more.  2. Follow up as scheduled.   Subjective:   JENNAMARIE GOINGS is a 75 y.o. female presenting today for follow up of  Chief Complaint  Patient presents with   Breathing Problem    A little bit of wheezing, and having a problem breathing this morning. Had covid back in July. Saw pulmonologist Dec 1st and had an xray taken, nothing came up on x ray to see why she has been having so much mucus drainage.    MORIYA MITCHELL has a history of the following: Patient Active Problem List   Diagnosis Date Noted   COVID 04/20/2021   Chest pain of uncertain etiology 98/33/8250   Allergies 12/22/2019   Dizziness 11/19/2019   Grief reaction 08/27/2019   Left shoulder pain 08/27/2019   Hyperlipidemia 07/14/2018   Asthma, mild intermittent 06/06/2018   Morbid obesity ([place]) 01/09/2018   Vitamin D deficiency 06/11/2017   Preventative health care 06/11/2017   Hypocalcemia 02/25/2017   Bradycardia 02/25/2017   Hearing loss 01/18/2017   Hyperglycemia 03/05/2016   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   CHF (congestive heart failure) ([place]) 05/26/2015   Female bladder prolapse 05/26/2015   IBS (irritable bowel syndrome) 05/26/2015    History of chicken pox    Allergic urticaria 08/01/2014   Food allergy 08/01/2014   Tinnitus 03/14/2014   Osteoarthritis    Nonspecific abnormal electrocardiogram (ECG) (EKG)    Headache 10/21/2013   Chiari malformation    GERD 11/10/2010   Obstructive sleep apnea 08/28/2010   Essential hypertension 08/28/2010   Seasonal and perennial allergic rhinitis 08/28/2010   DIVERTICULOSIS, COLON 08/28/2010    History obtained from: chart review and patient.  Sharlette is a 75 y.o. female presenting for a sick visit. She was last seen in [place] by Dr. Nelva Bush. At that time, she recommended continuing with Xyzal as well as Singulair and Astelin. She had previously been on [place]. Continued avoidance of her food triggers was recommended. EpiPen was up to date. Asthma was controlled with albuterol as needed.  Since the last visit, she has mostly done well. She is here today because she came to get her shot and complained to the shot room MA that she was unable to swallow. She was concerned that Marvelyn was having an allergic reaction, therefore she was added to my schedule.  She tells me that she has chronic sinus drianage and mucosu drainage that she has a hard time explaining. She is having a hard time breathing and she was told that she would not get her allergy shot. She explains that she is now feeling better after she swallowed several times. She reports that she thinks that she is having a lot of postnasal drip  that is making her have his sensation of difficult swallowing. But she is now around 90% back to normal. She had a steroid inbjection on December 1st for the "mucous buildup". This was done by Dr. Annamaria Boots who manages her CPAP. It did not seem to help much at all.   Asthma/Respiratory Symptom History: She is wlays told that her lungs are clear. She had a CXR done in January and her lgs were clear. Chest was clear without PNA.  She has not been using her albuterol inhaler at all. She has not  needed prednisone at all.   Allergic Rhinitis Symptom History: She does Astelin every day. She has been good about that. It does help. She did the [place] and she only got a sa[ple becauwe the copay was too expensive. This was not working any more than anything else. If it was working well she would have figure out a way to get it.  She has been on Flonase.  Otherwise, there have been no changes to her past medical history, surgical history, family history, or social history.    Review of Systems  Constitutional: Negative.  Negative for chills, fever, malaise/fatigue and weight loss.  HENT: Negative.  Negative for congestion, ear discharge and ear pain.   Eyes:  Negative for pain, discharge and redness.  Respiratory:  Negative for cough, sputum production, shortness of breath and wheezing.   Cardiovascular: Negative.  Negative for chest pain and palpitations.  Gastrointestinal:  Negative for abdominal pain, constipation, diarrhea, heartburn, nausea and vomiting.  Skin: Negative.  Negative for itching and rash.  Neurological:  Negative for dizziness and headaches.  Endo/Heme/Allergies:  Negative for environmental allergies. Does not bruise/bleed easily.      Objective:   Blood pressure (!) 150/76, pulse 82, temperature 98.4 F (36.9 C), temperature source Temporal, height 4\' 11"  (1.499 m), weight 235 lb 9.6 oz (106.9 kg), SpO2 98 %. Body mass index is 47.59 kg/m.   Physical Exam:  Physical Exam Vitals reviewed.  Constitutional:      Appearance: She is well-developed.  HENT:     Head: Normocephalic and atraumatic.     Right Ear: Tympanic membrane, ear canal and external ear normal.     Left Ear: Tympanic membrane, ear canal and external ear normal.     Nose: No nasal deformity, septal deviation, mucosal edema or rhinorrhea.     Right Turbinates: Enlarged, swollen and pale.     Left Turbinates: Enlarged, swollen and pale.     Right Sinus: No maxillary sinus tenderness or frontal  sinus tenderness.     Left Sinus: No maxillary sinus tenderness or frontal sinus tenderness.     Mouth/Throat:     Mouth: Mucous membranes are not pale and not dry.     Pharynx: Uvula midline.     Comments: There is moderate cobblestoning present.  Eyes:     General: Lids are normal. No allergic shiner.       Right eye: No discharge.        Left eye: No discharge.     Conjunctiva/sclera: Conjunctivae normal.     Right eye: Right conjunctiva is not injected. No chemosis.    Left eye: Left conjunctiva is not injected. No chemosis.    Pupils: Pupils are equal, round, and reactive to light.  Cardiovascular:     Rate and Rhythm: Normal rate and regular rhythm.     Heart sounds: Normal heart sounds.  Pulmonary:     Effort: Pulmonary effort is  normal. No tachypnea, accessory muscle usage or respiratory distress.     Breath sounds: Normal breath sounds. No wheezing, rhonchi or rales.  Chest:     Chest wall: No tenderness.  Lymphadenopathy:     Cervical: No cervical adenopathy.  Skin:    Coloration: Skin is not pale.     Findings: No abrasion, erythema, petechiae or rash. Rash is not papular, urticarial or vesicular.  Neurological:     Mental Status: She is alert.  Psychiatric:        Behavior: Behavior is cooperative.     Diagnostic studies: none      Salvatore Marvel, MD  [facility]

## 2021-09-21 NOTE — Patient Instructions (Addendum)
Postnasal drip - likely related to uncontrolled reflux (worsens at night) - COVID testing was negative. - Symptoms were already better by the time I saw you in the exam room. - Continue with Astelin up to twice daily for the postnasal drip. - Add on Mucinex [phone] mg twice daily for one week to help with breaking up the mucous.  - Restart omeprazole 20mg  daily (to help with reflux) - Add on famotidine 40mg  twice daily to help control reflux even more.  2. Follow up as scheduled.    Please inform us of any Emergency Department visits, hospitalizations, or changes in symptoms. Call us before going to the ED for breathing or allergy symptoms since we might be able to fit you in for a sick visit. Feel free to contact us anytime with any questions, problems, or concerns.  It was a pleasure to meet you today!  Websites that have reliable patient information: 1. American Academy of Asthma, Allergy, and Immunology: www.aaaai.org 2. Food Allergy Research and Education (FARE): foodallergy.org 3. Mothers of Asthmatics: http://www.asthmacommunitynetwork.org 4. American College of Allergy, Asthma, and Immunology: www.acaai.org   COVID-19 Vaccine Information can be found at: ShippingScam.co.uk For questions related to vaccine distribution or appointments, please email [email or web address] or call [phone].   We realize that you might be concerned about having an allergic reaction to the COVID19 vaccines. To help with that concern, WE ARE OFFERING THE COVID19 VACCINES IN OUR OFFICE! Ask the front desk for dates!     Like [place] on [place] and Instagram for our latest updates!      A healthy democracy works best when [employer] participate! Make sure you are registered to vote! If you have moved or changed any of your contact information, you will need to get this updated before voting!  In some cases, you MAY be able to register to  vote online: [email or web address]

## 2021-09-25 ENCOUNTER — Encounter: Payer: Self-pay | Admitting: Allergy & Immunology

## 2021-10-06 ENCOUNTER — Ambulatory Visit (INDEPENDENT_AMBULATORY_CARE_PROVIDER_SITE_OTHER): Payer: Medicare HMO

## 2021-10-06 DIAGNOSIS — J309 Allergic rhinitis, unspecified: Secondary | ICD-10-CM

## 2021-10-12 ENCOUNTER — Telehealth: Payer: Self-pay | Admitting: Cardiology

## 2021-10-12 DIAGNOSIS — K219 Gastro-esophageal reflux disease without esophagitis: Secondary | ICD-10-CM

## 2021-10-12 DIAGNOSIS — J4530 Mild persistent asthma, uncomplicated: Secondary | ICD-10-CM

## 2021-10-12 DIAGNOSIS — K589 Irritable bowel syndrome without diarrhea: Secondary | ICD-10-CM

## 2021-10-12 NOTE — Telephone Encounter (Signed)
Incredibly thorough and very sound advice. We can refer her to GI as well as I think you are right in that she is dealing with significant reflux. Completely agree with your plan!

## 2021-10-12 NOTE — Telephone Encounter (Signed)
Spoke with the pt and informed her that per Dr. Johney Frame, we will go ahead and refer her to [facility] for GERD/significant reflux.  Informed the pt that I will place the referral in the system and have one of our [facility] Schedulers reach out to their office to help coordinate a consult appt for her.  Advised the pt to keep her follow-up appt as scheduled with our office on 2/1. Pt verbalized understanding and agrees with this plan.

## 2021-10-12 NOTE — Telephone Encounter (Signed)
Pt said someone from our office calling her, she asked if someone can call her back

## 2021-10-12 NOTE — Telephone Encounter (Signed)
Pt c/o of Chest Pain: STAT if CP now or developed within 24 hours  1. Are you having CP right now? No but really lightheaded and dizzy  2. Are you experiencing any other symptoms (ex. SOB, nausea, vomiting, sweating)? SOB  3. How long have you been experiencing CP? Since 10/10/21  4. Is your CP continuous or coming and going? frequently  5. Have you taken Nitroglycerin? No  ?

## 2021-10-12 NOTE — Telephone Encounter (Signed)
Pt was calling in to inform Dr. Johney Frame that she has been experiencing symptoms of worsening indigestion, increased mucous in her throat and chest area after eating, and getting choked at times.  Pt states symptoms have been going on for over a year.  Pt states she has bad indigestion (hasn't seen a GI MD for) and is managed by her PCP.  Pt states she takes prevacid and pepcid.  Pt states she occasionally gets sob, but has bad asthma and allergies. Pt states her symptoms of chest discomfort and indigestion mostly occur after drinking or eating something.  She states her increased mucous occurs after eating as well, which makes her cough for an hour or so thereafter.  She denies any orthopnea, chest pain that persists, N/V, diaphoresis, pre-syncopal or syncopal episodes. She denies any palpitations.  She states she has chronic lower extremity edema but she states this is very minimal at this time and swelling is controlled.  She states she does weigh herself daily and reports no acute increase in weight of 3lbs in 24 hr time period or 5 lbs in a week.  She reports she does get lightheaded at times, but this has been ongoing for years.  She states her rates stay in the 60s for her, but is not monitoring her BP at this time, for she states she just recently dropped her monitor and broke it.  She states she will be going to purchase a new BP cuff this weekend and will start monitoring and recording this daily.  Pt was inquiring if Dr. Johney Frame has any further suggestions for her.  She states her complaints all-together have been aggravating more so since this past Tuesday 1/17.  Pt will see Dr. Johney Frame in clinic on 10/25/21.  Advised the pt that she should start monitoring and logging her pressures and call some readings into the office next week.  Advised her to help with her GERD, she should be eating small frequent meals daily.  Advised her to avoid caffeine, chocolate, fried and salty foods.  Advised  her to hydrate with water.  Advised her to avoid lying down 1-1/12 hours after eating a meal.  Advised her to touch base with her PCP about GI issues and increased mucous and coughing issues that occur after eating.  Advised her she could possibly use OTC Mucinex without D for increased mucous if needed.  Advised her to continue her meds for indigestion, but discuss with her PCP if both are necessary or should her regimen be changed all together.  Also advised her to have PCP advise on referring her to a GI MD, for further management of GERD issues. Advised her to continue her asthma/allergy treatment regimen.  Advised her to continue weighing herself daily, wear her compressions during the day and elevate her extremities at rest.  ED precautions provided to the pt if acute cardiac complaints occur, persist, or worsen between now and her appt with Dr. Johney Frame on 2/1. Informed the pt that I will route this message to Dr. Johney Frame for any further recommendations and I will follow-up with the pt accordingly thereafter.  Pt verbalized understanding and agrees with this plan.

## 2021-10-13 NOTE — Telephone Encounter (Signed)
RE: refer to [facility] per Dr. Johney Frame Received: Today Anne Ly, LPN GI is aware and has called the patient and sent mychart message for her to call them back

## 2021-10-19 ENCOUNTER — Ambulatory Visit (INDEPENDENT_AMBULATORY_CARE_PROVIDER_SITE_OTHER): Payer: Medicare HMO

## 2021-10-19 DIAGNOSIS — J309 Allergic rhinitis, unspecified: Secondary | ICD-10-CM | POA: Diagnosis not present

## 2021-10-21 NOTE — Progress Notes (Deleted)
Cardiology Office Note:    Date:  10/21/2021   ID:  Anne Barnes, DOB 07-10-1946, MRN [id number]  PCP:  Mosie Lukes, MD   [place]  Cardiologist:  Freada Bergeron, MD  Advanced Practice Provider:  Liliane Shi, PA-C Electrophysiologist:  None   Referring MD: Mosie Lukes, MD    History of Present Illness:    Anne Barnes is a 76 y.o. 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 Meda Coffee who now returns to clinic for follow-up.  She was seen in January 2021 for concern of chest pain and a coronary CTA was performed that showed mild nonobstructive CAD and also findings of peribronchovascular nodules in the medial aspect of the right lower lobe with ill-defined margins, likely infectious or inflammatory in etiology.  Repeat CT scan on Jan 26, 2020 showed that those resolved and it confirmed inflammatory etiology.  She was started on Crestor 10 mg daily that she tolerates well.  Last saw Dr. Meda Coffee on 08/04/20 where she was mourning the loss of her husband of 21 years. She had mild LE edema but no orthopnea. Her spironolactone was increased to 25mg  daily.  Last seen in clinic on 04/25/21 by Richardson Dopp where she was doing well. Continuing to have issues with allergies.  Today, ***  Past Medical History:  Diagnosis Date   Acute combined systolic and diastolic heart failure ([place]) 05/26/2015   ALLERGIC RHINITIS    Allergic urticaria 08/01/2014   Patient reports allergies to shrimp, egg whites, tomatoes, dairy    Allergy    seasonal and numerous food and drug allergies.   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   Office Spirometry 11/07/17-WNL-FVC 1.58/82%, FEV1 1.27/85%, ratio 0.80, FEF 25-75% 0.26/89%   Bradycardia 02/25/2017   Chiari malformation    Noted MRI brain 09/2013 - s/p neuro eval for same   DIVERTICULOSIS, COLON    Essential  hypertension 08/28/2010   Qualifier: Diagnosis of  By: Marca Ancona RMA, Lucy      Frequent headaches    GERD    H/O measles    H/O mumps    Hearing loss 01/18/2017   History of chicken pox    Hyperglycemia 03/05/2016   HYPERTENSION    Hypocalcemia 02/25/2017   IBS (irritable bowel syndrome) 05/26/2015   Nonspecific abnormal electrocardiogram (ECG) (EKG)    OBSTRUCTIVE SLEEP APNEA 12/2008 dx   noncompliant with CPAP qhs   Obstructive sleep apnea 08/28/2010   NPSG 12/2008:  AHI 13/hr with desats to 78%    Preventative health care 06/11/2017   Sciatica    right side   Seasonal and perennial allergic rhinitis 08/28/2010   Allergy profile 03/11/14- positive especially for dust, cat and dog Food allergy profile- total IgE 86 with several food group elevations Sed rate-WNL      Sinusitis 06/06/2017   Sleep apnea    uses cpap   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  Allergies:   Codeine, Eggs or egg-derived products, Hydralazine hcl, Lisinopril-hydrochlorothiazide, Metoprolol, Other, Shrimp [shellfish allergy], and Tomato   Social History   Socioeconomic History   Marital status: Widowed    Spouse name: Not on file   Number of children: Not on file   Years of education: Not on file   Highest education level: Not on file  Occupational History   Occupation: retired  Tobacco Use   Smoking status: Never   Smokeless tobacco: Never   Tobacco comments:    Married, lives with spouse, dtr 2 g-kids.  Vaping Use   Vaping Use: Never used  Substance and Sexual Activity   Alcohol use: No    Comment: rare   Drug use: No    Sexual activity: Not Currently    Comment: lives with husband, no dietary restrictions avoid foods allergic to, retired  Other Topics Concern   Not on file  Social History Narrative   Right handed   One story with daughter and two grandkids   Social Determinants of Health   Financial Resource Strain: Low Risk    Difficulty of Paying Living Expenses: Not hard at all  Food Insecurity: No Food Insecurity   Worried About Charity fundraiser in the Last Year: Never true   Arboriculturist in the Last Year: Never true  Transportation Needs: No Transportation Needs   Lack of Transportation (Medical): No   Lack of Transportation (Non-Medical): No  Physical Activity: Sufficiently Active   Days of Exercise per Week: 7 days   Minutes of Exercise per Session: 40 min  Stress: No Stress Concern Present   Feeling of Stress : Not at all  Social Connections: Moderately Isolated   Frequency of Communication with Friends and Family: More than three times a week   Frequency of Social Gatherings with Friends and Family: More than three times a week   Attends Religious Services: More than 4 times per year   Active Member of [employer] or Organizations: No   Attends Archivist Meetings: Never   Marital Status: Widowed     Family History: The patient's family history includes Alcohol abuse in her brother and another family member; Arthritis in her father and mother; Diabetes in her brother; Fibromyalgia in her daughter; Berenice Primas' disease in her daughter; Heart attack in her brother; Heart disease in her brother and father; Hypertension in her daughter, daughter, maternal grandfather, and sister; Kidney disease in her father, sister, and sister; Lung cancer in her brother; Lupus in her daughter; Obesity in her sister; Pneumonia in her brother and father; Rheum arthritis in her daughter; Sjogren's syndrome in her daughter; Throat cancer in her brother. There is no history of Ataxia, Chorea, Dementia,  Mental retardation, Migraines, Multiple sclerosis, Neurofibromatosis, Neuropathy, Parkinsonism, Seizures, Stroke, Colon cancer, Colon polyps, Esophageal cancer, Rectal cancer, or Stomach cancer.  ROS:   Please see the history of present illness.    Review of Systems  Constitutional:  Negative for chills, fever and malaise/fatigue.  HENT:  Negative for sore throat.   Eyes:  Negative for blurred vision and redness.  Respiratory:  Positive for shortness of breath.   Cardiovascular:  Positive for leg swelling. Negative for chest pain, palpitations, orthopnea, claudication and PND.  Gastrointestinal:  Negative for melena, nausea and vomiting.  Genitourinary:  Negative for dysuria and flank pain.  Musculoskeletal:  Positive for joint pain.  Neurological:  Negative for dizziness and loss of consciousness.  Endo/Heme/Allergies:  Negative for polydipsia.  Psychiatric/Behavioral:  Negative for substance abuse.    EKGs/Labs/Other Studies Reviewed:    The following studies were reviewed today: TTE 12/15/16: -------------------------------------------------------------------  Left ventricle:  The cavity size was normal. Systolic function was  normal. The estimated ejection fraction was in the range of 60% to  65%. Wall motion was normal; there were no regional wall motion  abnormalities. There was no evidence of elevated ventricular  filling pressure by Doppler parameters.   -------------------------------------------------------------------  Aortic valve:   Trileaflet; normal thickness leaflets. Mobility was  not restricted.  Doppler:  Transvalvular velocity was within the  normal range. There was no stenosis. There was no regurgitation.     -------------------------------------------------------------------  Aorta:  Aortic root: The aortic root was normal in size.   -------------------------------------------------------------------  Mitral valve:   Structurally normal valve.   Mobility was not   restricted.  Doppler:  Transvalvular velocity was within the normal  range. There was no evidence for stenosis. There was no  regurgitation.    Peak gradient (D): 4 mm Hg.   -------------------------------------------------------------------  Left atrium:  The atrium was normal in size.   -------------------------------------------------------------------  Right ventricle:  Poorly visualized. The cavity size was normal.  Wall thickness was normal. Systolic function was normal.   -------------------------------------------------------------------  Pulmonic valve:    Structurally normal valve.   Cusp separation was  normal.  Doppler:  Transvalvular velocity was within the normal  range. There was no evidence for stenosis. There was mild  regurgitation.   -------------------------------------------------------------------  Tricuspid valve:   Structurally normal valve.    Doppler:  Transvalvular velocity was within the normal range. There was no  regurgitation.   -------------------------------------------------------------------  Pulmonary artery:   The main pulmonary artery was normal-sized.  Systolic pressure was within the normal range.   -------------------------------------------------------------------  Right atrium:  The atrium was normal in size.   -------------------------------------------------------------------  Pericardium:  There was no pericardial effusion.   -------------------------------------------------------------------  Systemic veins:  Inferior vena cava: The vessel was normal in size.   Myoview 12-15-2016: The left ventricular ejection fraction is hyperdynamic (>65%). Nuclear stress EF: 66%. No T wave inversion was noted during stress. There was no ST segment deviation noted during stress. Defect 1: There is a small defect of mild severity.   Small size, mild intensity fixed apical lateral and inferolateral perfusion defect, likely attenuation artifact. No  significant reversible ischemia. LVEF 66% with normal wall motion. This is a low risk study.  Cardiac monitor 12/2019: Sinus bradycardia to sinus tachycardia. Patient had a min HR of 39 bpm, max HR of 167 bpm, and avg HR of 57 bpm. Very short episodes of SVT, no pauses no atrial fibrillation.   Short episodes of SVT, no other significant arrhythmias.   Coronary CTA 11/20/19: FINDINGS: A 100 kV prospective scan was triggered in the descending thoracic aorta at 111 HU's. Axial non-contrast 3 mm slices were carried out through the heart. The data set was analyzed on a dedicated work station and scored using the Tripp. Gantry rotation speed was 250 msecs and collimation was .6 mm. No beta blockade and 0.8 mg of sl NTG was given. The 3D data set was reconstructed in 5% intervals of the 67-82 % of the R-R cycle. Diastolic phases were analyzed on a dedicated work station using MPR, MIP and VRT modes. The patient received 80 cc of contrast.   Aorta:  Normal size.  Mild diffuse calcifications.  No dissection.   Aortic Valve:  Trileaflet.  No  calcifications.   Coronary Arteries:  Normal coronary origin.  Right dominance.   RCA is a large dominant artery that gives rise to PDA and PLA. There is significant motion, however there is only mild mixed plaque in the proximal and mid RCA with 25-49% plaque.   Left main is a large artery that gives rise to LAD and LCX arteries. Left main has no plaque.   LAD is a large vessel that has mild calcified plaque in the proximal and mid LAD with stenosis 0-25%. Distal LAD has no significant plaque.   D1 is a very small branch with no plaque.   LCX is a non-dominant artery that gives rise to two OM branches, there is minimal plaque.   Other findings:   Normal pulmonary vein drainage into the left atrium.   Normal left atrial appendage without a thrombus.   Normal size of the pulmonary artery.   IMPRESSION: 1. Coronary calcium score of  127. This was 57 percentile for age and sex matched control.   2. Normal coronary origin with right dominance.   3. CAD-RADS 2. Mild non-obstructive CAD (25-49%). Consider non-atherosclerotic causes of chest pain. Consider preventive therapy and risk factor modification.    Recent Labs: 03/03/2021: NT-Pro BNP 17 07/31/2021: ALT 30; BUN 18; Creatinine, Ser 0.80; Hemoglobin 12.5; Platelets 229.0; Potassium 4.1; Sodium 142; TSH 0.76  Recent Lipid Panel    Component Value Date/Time   CHOL 127 07/31/2021 1228   CHOL 116 02/17/2020 0904   TRIG 41.0 07/31/2021 1228   TRIG 55 12/30/2009 0000   HDL 45.40 07/31/2021 1228   HDL 42 02/17/2020 0904   CHOLHDL 3 07/31/2021 1228   VLDL 8.2 07/31/2021 1228   LDLCALC 73 07/31/2021 1228   LDLCALC 64 02/17/2020 0904     Physical Exam:    VS:  There were no vitals taken for this visit.    Wt Readings from Last 3 Encounters:  09/21/21 235 lb 9.6 oz (106.9 kg)  08/24/21 237 lb 12.8 oz (107.9 kg)  07/31/21 233 lb 6.4 oz (105.9 kg)     GEN:  Well nourished, well developed in no acute distress HEENT: Normal NECK: No JVD; No carotid bruits CARDIAC: RRR, no murmurs, rubs, gallops RESPIRATORY:  Clear to auscultation without rales, wheezing or rhonchi  ABDOMEN: Soft, non-tender, non-distended MUSCULOSKELETAL:  R>L 1+ pedal edema (right is always bigger than left) SKIN: Warm and dry NEUROLOGIC:  Alert and oriented x 3 PSYCHIATRIC:  Normal affect   ASSESSMENT:    No diagnosis found.  PLAN:    In order of problems listed above:  #Nonobstructive CAD: Coronary CTA showed coronary calcium of 127 that was 71 percentile February 2021.  There was mild nonobstructive CAD. No anginal symptoms. -Continue crestor 10mg  daily -Continue ASA 81mg  daily -Continue lifestyle modifications  #[place] Diastolic HF: Has mild LE edema today but no orthopnea or PND. Has been trying to increase her exercise and change her diet to help with weight loss.   -Continue spiro 25mg  daily -Continue losartan 100mg  daily -Low Na diet -Continue lasix 20mg  3x/wk  #HTN: Well controlled at home per patient report. -Continue amlodipine 2.5mg  BID -Continue losartan 100mg  daily -Continue spironolactone 25mg  daily  #HLD: -Crestor 10mg  as above -Continue lifestyle modifications as detailed below  Exercise recommendations: Goal of exercising for at least 30 minutes a day, at least 5 times per week.  Please exercise to a moderate exertion.  This means that while exercising it is difficult to speak in full  sentences, however you are not so short of breath that you feel you must stop, and not so comfortable that you can carry on a full conversation.  Exertion level should be approximately a 5/10, if 10 is the most exertion you can perform.  Diet recommendations: Recommend a heart healthy diet such as the Mediterranean diet.  This diet consists of plant based foods, healthy fats, lean meats, olive oil.  It suggests limiting the intake of simple carbohydrates such as white breads, pastries, and pastas.  It also limits the amount of red meat, wine, and dairy products such as cheese that one should consume on a daily basis.     Medication Adjustments/Labs and Tests Ordered: Current medicines are reviewed at length with the patient today.  Concerns regarding medicines are outlined above.  No orders of the defined types were placed in this encounter.  No orders of the defined types were placed in this encounter.   There are no Patient Instructions on file for this visit.    Signed, Freada Bergeron, MD  10/21/2021 9:04 PM    [place]

## 2021-10-25 ENCOUNTER — Encounter: Payer: Self-pay | Admitting: Cardiology

## 2021-10-25 ENCOUNTER — Ambulatory Visit: Payer: Medicare HMO | Admitting: Cardiology

## 2021-10-25 ENCOUNTER — Other Ambulatory Visit: Payer: Self-pay

## 2021-10-25 VITALS — BP 154/80 | HR 83 | Ht 59.0 in | Wt 234.8 lb

## 2021-10-25 DIAGNOSIS — I10 Essential (primary) hypertension: Secondary | ICD-10-CM

## 2021-10-25 DIAGNOSIS — I5032 Chronic diastolic (congestive) heart failure: Secondary | ICD-10-CM | POA: Diagnosis not present

## 2021-10-25 DIAGNOSIS — I2510 Atherosclerotic heart disease of native coronary artery without angina pectoris: Secondary | ICD-10-CM

## 2021-10-25 DIAGNOSIS — E7800 Pure hypercholesterolemia, unspecified: Secondary | ICD-10-CM | POA: Diagnosis not present

## 2021-10-25 DIAGNOSIS — K219 Gastro-esophageal reflux disease without esophagitis: Secondary | ICD-10-CM

## 2021-10-25 DIAGNOSIS — R072 Precordial pain: Secondary | ICD-10-CM | POA: Diagnosis not present

## 2021-10-25 NOTE — Progress Notes (Signed)
Cardiology Office Note:    Date:  10/25/2021   ID:  Anne Barnes, DOB Jan 28, 1946, MRN [id number]  PCP:  Mosie Lukes, MD   [place]  Cardiologist:  Freada Bergeron, MD  Advanced Practice Provider:  Liliane Shi, PA-C Electrophysiologist:  None   Referring MD: Mosie Lukes, MD    History of Present Illness:    Anne Barnes is a 76 y.o. 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 Meda Coffee who now returns to clinic for follow-up.  She was seen in January 2021 for concern of chest pain and a coronary CTA was performed that showed mild nonobstructive CAD and also findings of peribronchovascular nodules in the medial aspect of the right lower lobe with ill-defined margins, likely infectious or inflammatory in etiology.  Repeat CT scan on Jan 26, 2020 showed that those resolved and it confirmed inflammatory etiology.  She was started on Crestor 10 mg daily that she tolerates well.  Last saw Dr. Meda Coffee on 08/04/20 where she was mourning the loss of her husband of 69 years. She had mild LE edema but no orthopnea. Her spironolactone was increased to 25mg  daily.  Last seen in clinic on 04/25/21 by Richardson Dopp where she was doing well. Continuing to have issues with allergies.  Today, the patient overall is feeling weak, fatigued and tremulous. She also endorses seasonal allergies that cause similar symptoms when they flare-up.   At this time her chest discomfort is still present, but is not as severe as it was when she messaged our office. Additionally, last Saturday she felt right UE aches that radiated distally to her hand. However this pain resolved 2 days later. Her symptoms were not exertional in nature and mainly worsened by laying down or eating meals. Lately her daughter is cooking most of her meals, and she has noticed that there are more  herbs and spices being used. She thinks this is causing reflux. She is planned to see GI soon.  At home her blood pressure has been more stable, averaging in the 740C systolic. She affirms that her blood pressure in clinic today (154/80) is not typical. Tolerating medications.  She plans to increase her exercise and lose some weight by her next appointment.  She denies any palpitations, headaches, syncope, orthopnea, or PND.   Past Medical History:  Diagnosis Date   Acute combined systolic and diastolic heart failure ([place]) 05/26/2015   ALLERGIC RHINITIS    Allergic urticaria 08/01/2014   Patient reports allergies to shrimp, egg whites, tomatoes, dairy    Allergy    seasonal and numerous food and drug allergies.   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   Office Spirometry 11/07/17-WNL-FVC 1.58/82%, FEV1 1.27/85%, ratio 0.80, FEF 25-75% 0.26/89%   Bradycardia 02/25/2017   Chiari malformation    Noted MRI brain 09/2013 - s/p neuro eval for same   DIVERTICULOSIS, COLON    Essential hypertension 08/28/2010   Qualifier: Diagnosis of  By: Marca Ancona RMA, Lucy      Frequent headaches    GERD    H/O measles    H/O mumps    Hearing loss 01/18/2017   History of chicken pox    Hyperglycemia 03/05/2016   HYPERTENSION    Hypocalcemia 02/25/2017   IBS (irritable bowel syndrome) 05/26/2015   Nonspecific abnormal electrocardiogram (ECG) (EKG)    OBSTRUCTIVE SLEEP APNEA 12/2008  dx   noncompliant with CPAP qhs   Obstructive sleep apnea 08/28/2010   NPSG 12/2008:  AHI 13/hr with desats to 78%    Preventative health care 06/11/2017   Sciatica    right side   Seasonal and perennial allergic rhinitis 08/28/2010   Allergy profile 03/11/14- positive especially for dust, cat and dog Food allergy profile- total IgE 86 with several food group elevations Sed rate-WNL      Sinusitis 06/06/2017   Sleep apnea    uses cpap   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   Vaginitis 01/18/2017   Vitamin D deficiency 06/11/2017    Past Surgical  History:  Procedure Laterality Date   [place]   Breast biopsy   COLONOSCOPY  2011   Dr Collene Mares   UMBILICAL HERNIA REPAIR      Current Medications: Current Meds  Medication Sig   albuterol (VENTOLIN HFA) 108 (90 Base) MCG/ACT inhaler INHALE 2 PUFFS INTO THE LUNGS EVERY 6 HOURS AS NEEDED FOR WHEEZING FOR SHORTNESS OF BREATH   ALPRAZolam (XANAX) 0.5 MG tablet TAKE 1 TABLET BY MOUTH TWICE DAILY AS NEEDED FOR ANXIETY AND FOR SLEEP   amLODipine (NORVASC) 2.5 MG tablet Take 1 tablet by mouth twice daily   aspirin EC 81 MG tablet Take 81 mg by mouth daily. Swallow whole.   azelastine (ASTELIN) 0.1 % nasal spray Place 2 sprays into both nostrils 2 (two) times daily as needed for rhinitis.   b complex vitamins tablet Take 1 tablet by mouth daily.   Cholecalciferol (VITAMIN D) 2000 UNITS CAPS Take by mouth daily.   EPINEPHrine 0.3 mg/0.3 mL IJ SOAJ injection Inject 0.3 mg into the muscle as needed for anaphylaxis.   famotidine (PEPCID) 40 MG tablet Take 1 tablet (40 mg total) by mouth 2 (two) times daily.   furosemide (LASIX) 20 MG tablet Take 1 tablet (20 mg total) by mouth 3 (three) times a week. Monday, Wednesday and Friday.   levocetirizine (XYZAL) 5 MG tablet TAKE 1 TABLET BY MOUTH ONCE DAILY IN THE EVENING   losartan (COZAAR) 100 MG tablet Take 1 tablet by mouth once daily   montelukast (SINGULAIR) 10 MG tablet Take 1 tablet (10 mg total) by mouth at bedtime.   Multiple Vitamin (MULTIVITAMIN) tablet Take 1 tablet by mouth daily.   olopatadine (PATANOL) 0.1 % ophthalmic solution Place 1 drop into both eyes 2 (two) times daily.   omeprazole (PRILOSEC) 20 MG capsule Take 1 capsule (20 mg total) by mouth daily.   rosuvastatin (CRESTOR) 10 MG tablet Take 1 tablet by mouth once daily   spironolactone (ALDACTONE) 25 MG tablet Take 1 tablet (25 mg total) by mouth daily.   VITAMIN D, CHOLECALCIFEROL, PO Take by mouth.     Allergies:   Codeine, Eggs or  egg-derived products, Hydralazine hcl, Lisinopril-hydrochlorothiazide, Metoprolol, Other, Shrimp [shellfish allergy], and Tomato   Social History   Socioeconomic History   Marital status: Widowed    Spouse name: Not on file   Number of children: Not on file   Years of education: Not on file   Highest education level: Not on file  Occupational History   Occupation: retired  Tobacco Use   Smoking status: Never   Smokeless tobacco: Never   Tobacco comments:    Married, lives with spouse, dtr 2 g-kids.  Vaping Use   Vaping Use: Never used  Substance and Sexual Activity   Alcohol use: No  Comment: rare   Drug use: No   Sexual activity: Not Currently    Comment: lives with husband, no dietary restrictions avoid foods allergic to, retired  Other Topics Concern   Not on file  Social History Narrative   Right handed   One story with daughter and two grandkids   Social Determinants of Health   Financial Resource Strain: Low Risk    Difficulty of Paying Living Expenses: Not hard at all  Food Insecurity: No Food Insecurity   Worried About Charity fundraiser in the Last Year: Never true   Arboriculturist in the Last Year: Never true  Transportation Needs: No Transportation Needs   Lack of Transportation (Medical): No   Lack of Transportation (Non-Medical): No  Physical Activity: Sufficiently Active   Days of Exercise per Week: 7 days   Minutes of Exercise per Session: 40 min  Stress: No Stress Concern Present   Feeling of Stress : Not at all  Social Connections: Moderately Isolated   Frequency of Communication with Friends and Family: More than three times a week   Frequency of Social Gatherings with Friends and Family: More than three times a week   Attends Religious Services: More than 4 times per year   Active Member of [employer] or Organizations: No   Attends Archivist Meetings: Never   Marital Status: Widowed     Family History: The patient's family history  includes Alcohol abuse in her brother and another family member; Arthritis in her father and mother; Diabetes in her brother; Fibromyalgia in her daughter; Berenice Primas' disease in her daughter; Heart attack in her brother; Heart disease in her brother and father; Hypertension in her daughter, daughter, maternal grandfather, and sister; Kidney disease in her father, sister, and sister; Lung cancer in her brother; Lupus in her daughter; Obesity in her sister; Pneumonia in her brother and father; Rheum arthritis in her daughter; Sjogren's syndrome in her daughter; Throat cancer in her brother. There is no history of Ataxia, Chorea, Dementia, Mental retardation, Migraines, Multiple sclerosis, Neurofibromatosis, Neuropathy, Parkinsonism, Seizures, Stroke, Colon cancer, Colon polyps, Esophageal cancer, Rectal cancer, or Stomach cancer.  ROS:   Please see the history of present illness.    Review of Systems  Constitutional:  Positive for malaise/fatigue. Negative for chills and fever.  HENT:  Negative for sore throat.   Eyes:  Negative for blurred vision and redness.  Respiratory:  Positive for shortness of breath.   Cardiovascular:  Positive for chest pain and leg swelling. Negative for palpitations, orthopnea, claudication and PND.  Gastrointestinal:  Negative for melena, nausea and vomiting.  Genitourinary:  Negative for dysuria and flank pain.  Musculoskeletal:  Positive for joint pain.  Neurological:  Positive for dizziness, tremors and weakness. Negative for loss of consciousness.  Endo/Heme/Allergies:  Positive for environmental allergies. Negative for polydipsia.  Psychiatric/Behavioral:  Negative for substance abuse.    EKGs/Labs/Other Studies Reviewed:    The following studies were reviewed today:  Echo 03/31/2021:  1. Left ventricular ejection fraction, by estimation, is 60 to 65%. The  left ventricle has normal function. The left ventricle has no regional  wall motion abnormalities. Left  ventricular diastolic parameters were  normal.   2. Right ventricular systolic function is normal. The right ventricular  size is normal.   3. The mitral valve is normal in structure. Trivial mitral valve  regurgitation. No evidence of mitral stenosis.   4. The aortic valve is tricuspid. Aortic valve regurgitation is  not  visualized. No aortic stenosis is present.   5. The inferior vena cava is normal in size with greater than 50%  respiratory variability, suggesting right atrial pressure of 3 mmHg.  Cardiac monitor 12/2019: Sinus bradycardia to sinus tachycardia. Patient had a min HR of 39 bpm, max HR of 167 bpm, and avg HR of 57 bpm. Very short episodes of SVT, no pauses no atrial fibrillation.   Short episodes of SVT, no other significant arrhythmias.  Coronary CTA 11/20/19: FINDINGS: A 100 kV prospective scan was triggered in the descending thoracic aorta at 111 HU's. Axial non-contrast 3 mm slices were carried out through the heart. The data set was analyzed on a dedicated work station and scored using the [place]. Gantry rotation speed was 250 msecs and collimation was .6 mm. No beta blockade and 0.8 mg of sl NTG was given. The 3D data set was reconstructed in 5% intervals of the 67-82 % of the R-R cycle. Diastolic phases were analyzed on a dedicated work station using MPR, MIP and VRT modes. The patient received 80 cc of contrast.   Aorta:  Normal size.  Mild diffuse calcifications.  No dissection.   Aortic Valve:  Trileaflet.  No calcifications.   Coronary Arteries:  Normal coronary origin.  Right dominance.   RCA is a large dominant artery that gives rise to PDA and PLA. There is significant motion, however there is only mild mixed plaque in the proximal and mid RCA with 25-49% plaque.   Left main is a large artery that gives rise to LAD and LCX arteries. Left main has no plaque.   LAD is a large vessel that has mild calcified plaque in the proximal and mid  LAD with stenosis 0-25%. Distal LAD has no significant plaque.   D1 is a very small branch with no plaque.   LCX is a non-dominant artery that gives rise to two OM branches, there is minimal plaque.   Other findings:   Normal pulmonary vein drainage into the left atrium.   Normal left atrial appendage without a thrombus.   Normal size of the pulmonary artery.   IMPRESSION: 1. Coronary calcium score of 127. This was 38 percentile for age and sex matched control.   2. Normal coronary origin with right dominance.   3. CAD-RADS 2. Mild non-obstructive CAD (25-49%). Consider non-atherosclerotic causes of chest pain. Consider preventive therapy and risk factor modification.  TTE 2018: -------------------------------------------------------------------  Left ventricle:  The cavity size was normal. Systolic function was  normal. The estimated ejection fraction was in the range of 60% to  65%. Wall motion was normal; there were no regional wall motion  abnormalities. There was no evidence of elevated ventricular  filling pressure by Doppler parameters.   -------------------------------------------------------------------  Aortic valve:   Trileaflet; normal thickness leaflets. Mobility was  not restricted.  Doppler:  Transvalvular velocity was within the  normal range. There was no stenosis. There was no regurgitation.     -------------------------------------------------------------------  Aorta:  Aortic root: The aortic root was normal in size.   -------------------------------------------------------------------  Mitral valve:   Structurally normal valve.   Mobility was not  restricted.  Doppler:  Transvalvular velocity was within the normal  range. There was no evidence for stenosis. There was no  regurgitation.    Peak gradient (D): 4 mm Hg.   -------------------------------------------------------------------  Left atrium:  The atrium was normal in size.    -------------------------------------------------------------------  Right ventricle:  Poorly visualized. The cavity size  was normal.  Wall thickness was normal. Systolic function was normal.   -------------------------------------------------------------------  Pulmonic valve:    Structurally normal valve.   Cusp separation was  normal.  Doppler:  Transvalvular velocity was within the normal  range. There was no evidence for stenosis. There was mild  regurgitation.   -------------------------------------------------------------------  Tricuspid valve:   Structurally normal valve.    Doppler:  Transvalvular velocity was within the normal range. There was no  regurgitation.   -------------------------------------------------------------------  Pulmonary artery:   The main pulmonary artery was normal-sized.  Systolic pressure was within the normal range.   -------------------------------------------------------------------  Right atrium:  The atrium was normal in size.   -------------------------------------------------------------------  Pericardium:  There was no pericardial effusion.   -------------------------------------------------------------------  Systemic veins:  Inferior vena cava: The vessel was normal in size.   Myoview 2018: The left ventricular ejection fraction is hyperdynamic (>65%). Nuclear stress EF: 66%. No T wave inversion was noted during stress. There was no ST segment deviation noted during stress. Defect 1: There is a small defect of mild severity.   Small size, mild intensity fixed apical lateral and inferolateral perfusion defect, likely attenuation artifact. No significant reversible ischemia. LVEF 66% with normal wall motion. This is a low risk study.    EKG:      EKG is personally reviewed. 10/25/2021: Sinus rhythm. Rate 83 bpm.   Recent Labs: 03/03/2021: NT-Pro BNP 17 07/31/2021: ALT 30; BUN 18; Creatinine, Ser 0.80; Hemoglobin 12.5; Platelets  229.0; Potassium 4.1; Sodium 142; TSH 0.76   Recent Lipid Panel    Component Value Date/Time   CHOL 127 07/31/2021 1228   CHOL 116 02/17/2020 0904   TRIG 41.0 07/31/2021 1228   TRIG 55 12/30/2009 0000   HDL 45.40 07/31/2021 1228   HDL 42 02/17/2020 0904   CHOLHDL 3 07/31/2021 1228   VLDL 8.2 07/31/2021 1228   LDLCALC 73 07/31/2021 1228   LDLCALC 64 02/17/2020 0904     Physical Exam:    VS:  BP (!) 154/80    Pulse 83    Ht 4\' 11"  (1.499 m)    Wt 234 lb 12.8 oz (106.5 kg)    SpO2 97%    BMI 47.42 kg/m     Wt Readings from Last 3 Encounters:  10/25/21 234 lb 12.8 oz (106.5 kg)  09/21/21 235 lb 9.6 oz (106.9 kg)  08/24/21 237 lb 12.8 oz (107.9 kg)     GEN:  Well nourished, well developed in no acute distress HEENT: Normal NECK: No JVD; No carotid bruits CARDIAC: RRR, no murmurs, rubs, gallops RESPIRATORY:  Clear to auscultation without rales, wheezing or rhonchi  ABDOMEN: Soft, non-tender, non-distended MUSCULOSKELETAL:  R>L 1+ pedal edema, trace pedal edema in left (right is always bigger than left)  SKIN: Warm and dry NEUROLOGIC:  Alert and oriented x 3 PSYCHIATRIC:  Normal affect   ASSESSMENT:    1. Precordial pain   2. Essential hypertension   3. Coronary artery disease involving native coronary artery of native heart without angina pectoris   4. Morbid obesity ([place])   5. Chronic heart failure with preserved ejection fraction (HCC)   6. Gastroesophageal reflux disease, unspecified whether esophagitis present   7. Pure hypercholesterolemia     PLAN:    In order of problems listed above:  #Chest Pain: #GERD: Suspect related to underlying reflux as it is worse with drinking/eating and not associated with exertion. Overall symptoms have improved. No plans for ischemic work-up at this time  as low suspicion for cardiac etiology. Patient is planned to see GI. -Agree with GI referral -No need for ischemic work-up at this time as symptoms consistent with  GERD  #Nonobstructive CAD: Coronary CTA showed coronary calcium of 127 that was 71 percentile February 2021.  There was mild nonobstructive CAD. No exertional symptoms. -Continue crestor 10mg  daily -Continue ASA 81mg  daily -Continue lifestyle modifications  #[place] Diastolic HF: Has mild LE edema today but no orthopnea or PND. Has been trying to increase her exercise and change her diet to help with weight loss.  -Continue spiro 25mg  daily -Continue losartan 100mg  daily -Low Na diet -Continue lasix 20mg  3x/wk  #HTN: Well controlled at home per patient report. -Continue amlodipine 2.5mg  BID -Continue losartan 100mg  daily -Continue spironolactone 25mg  daily  #HLD: -Crestor 10mg  as above -Continue lifestyle modifications as detailed below -Can consider GLP-1 agonist in the future if patient amenable  Exercise recommendations: Goal of exercising for at least 30 minutes a day, at least 5 times per week.  Please exercise to a moderate exertion.  This means that while exercising it is difficult to speak in full sentences, however you are not so short of breath that you feel you must stop, and not so comfortable that you can carry on a full conversation.  Exertion level should be approximately a 5/10, if 10 is the most exertion you can perform.  Diet recommendations: Recommend a heart healthy diet such as the Mediterranean diet.  This diet consists of plant based foods, healthy fats, lean meats, olive oil.  It suggests limiting the intake of simple carbohydrates such as white breads, pastries, and pastas.  It also limits the amount of red meat, wine, and dairy products such as cheese that one should consume on a daily basis.   Follow-up: 3 months with APP.  Medication Adjustments/Labs and Tests Ordered: Current medicines are reviewed at length with the patient today.  Concerns regarding medicines are outlined above.   Orders Placed This Encounter  Procedures   EKG 12-Lead   No  orders of the defined types were placed in this encounter.  Patient Instructions  Medication Instructions:   Your physician recommends that you continue on your current medications as directed. Please refer to the Current Medication list given to you today.  *If you need a refill on your cardiac medications before your next appointment, please call your pharmacy*   Follow-Up:  3 MONTHS WITH DR. Johney Frame OR AN EXTENDER IN THE OFFICE     I,Mathew Stumpf,acting as a scribe for Freada Bergeron, MD.,have documented all relevant documentation on the behalf of Freada Bergeron, MD,as directed by  Freada Bergeron, MD while in the presence of Freada Bergeron, MD.  I, Freada Bergeron, MD, have reviewed all documentation for this visit. The documentation on 10/25/21 for the exam, diagnosis, procedures, and orders are all accurate and complete.    Signed, Freada Bergeron, MD  10/25/2021 11:38 AM    [place]

## 2021-10-25 NOTE — Patient Instructions (Signed)
Medication Instructions:   Your physician recommends that you continue on your current medications as directed. Please refer to the Current Medication list given to you today.  *If you need a refill on your cardiac medications before your next appointment, please call your pharmacy*   Follow-Up:  3 MONTHS WITH DR. Johney Frame OR AN EXTENDER IN THE OFFICE

## 2021-10-27 ENCOUNTER — Ambulatory Visit (INDEPENDENT_AMBULATORY_CARE_PROVIDER_SITE_OTHER): Payer: Medicare HMO | Admitting: *Deleted

## 2021-10-27 DIAGNOSIS — J309 Allergic rhinitis, unspecified: Secondary | ICD-10-CM | POA: Diagnosis not present

## 2021-10-30 ENCOUNTER — Other Ambulatory Visit: Payer: Self-pay | Admitting: Internal Medicine

## 2021-10-31 ENCOUNTER — Ambulatory Visit: Payer: Medicare HMO | Admitting: Gastroenterology

## 2021-10-31 ENCOUNTER — Other Ambulatory Visit: Payer: Self-pay

## 2021-10-31 ENCOUNTER — Encounter: Payer: Self-pay | Admitting: Gastroenterology

## 2021-10-31 VITALS — BP 140/68 | HR 75 | Ht 59.0 in | Wt 235.1 lb

## 2021-10-31 DIAGNOSIS — I5032 Chronic diastolic (congestive) heart failure: Secondary | ICD-10-CM

## 2021-10-31 DIAGNOSIS — R1013 Epigastric pain: Secondary | ICD-10-CM | POA: Diagnosis not present

## 2021-10-31 DIAGNOSIS — R0789 Other chest pain: Secondary | ICD-10-CM

## 2021-10-31 DIAGNOSIS — R140 Abdominal distension (gaseous): Secondary | ICD-10-CM | POA: Diagnosis not present

## 2021-10-31 DIAGNOSIS — G935 Compression of brain: Secondary | ICD-10-CM | POA: Diagnosis not present

## 2021-10-31 DIAGNOSIS — K2100 Gastro-esophageal reflux disease with esophagitis, without bleeding: Secondary | ICD-10-CM

## 2021-10-31 DIAGNOSIS — Z860100 Personal history of colon polyps, unspecified: Secondary | ICD-10-CM

## 2021-10-31 DIAGNOSIS — Z6841 Body Mass Index (BMI) 40.0 and over, adult: Secondary | ICD-10-CM

## 2021-10-31 MED ORDER — OMEPRAZOLE 40 MG PO CPDR: 40.0000 mg | DELAYED_RELEASE_CAPSULE | Freq: Two times a day (BID) | ORAL | 3 refills | Status: DC

## 2021-10-31 NOTE — Patient Instructions (Addendum)
If you are age 75 or older, your body mass index should be between 23-30. Your Body mass index is 47.49 kg/m. If this is out of the aforementioned range listed, please consider follow up with your Primary Care Provider.  If you are age 75 or younger, your body mass index should be between 19-25. Your Body mass index is 47.49 kg/m. If this is out of the aformentioned range listed, please consider follow up with your Primary Care Provider.   __________________________________________________________  The [facility] providers would like to encourage you to use [facility] to communicate with providers for non-urgent requests or questions.  Due to long hold times on the telephone, sending your provider a message by [facility] may be a faster and more efficient way to get a response.  Please allow 48 business hours for a response.  Please remember that this is for non-urgent requests.   We have sent the following medications to your pharmacy for you to pick up at your convenience:  Prilosec 40mg    If your symptoms improve we will decrease your Pepcid.  We will contact you regarding scheduling and endosopy at [facility] as an outpatient procedure.  Follow up in 3 months please call to schedule in the beginning of April [phone]   Thank you for choosing me and [facility].  Vito Cirigliano, D.O.

## 2021-10-31 NOTE — Progress Notes (Signed)
Chief Complaint:    GERD, IBS  HPI:     Patient is a 76 y.o. female with a history of allergic rhinitis, mild asthma, OSA, CHF (TTE 03/2021 with EF 60-65%), nonobstructive CAD on coronary CTA, HTN, obesity (BMI 47.5), bradycardia, Chiari malformation, GERD, IBS, referred to the Gastroenterology Clinic for evaluation of GERD and IBS.  Has been having new onset reflux symptoms for the last 4 months or so.  Described as heartburn, intermittent chest pain lasting up to a few hours.  Can have associated dyspepsia.  Increased belching.  No dysphagia.  No nocturnal symptoms.  No  SOB.  Had taken a short course of omeprazole then stopped. Dr. Ernst Bowler (Allergy) restarted her on Prilosec 20 mg QAM and started Pepcid 40 mg BID on 09/21/2021. Was seen by her Cardiologist on 10/25/2021 and diagnosed with noncardiac chest pain, suspected 2/2 uncontrolled reflux.   No prior EGD.   Separately, longstanding history of IBS-C with associated abdominal bloating.  Constipation largely controlled, but increasing bloating lately.  No hematochezia or melena.  Endoscopic History: - Colonoscopy (09/2009, Dr. Collene Mares): Redundant colon.  Cecum not fully intubated.  Otherwise normal.  Recommended VC - Colonoscopy (11/2020, Dr. Bryan Lemma): 8 mm transverse polyp (path: TA).  Redundant colon with excessive looping in the ascending colon.  Able to intubate cecum, but 50% of cecal surface could not be fully seen.  Narrow rectal vault.  Recommended VC - Virtual Colonoscopy (12/2020): Normal colon.  Repeat CRC screening in 7 years  CBC Latest Ref Rng & Units 07/31/2021 04/20/2021 10/20/2020  WBC 4.0 - 10.5 K/uL 8.3 7.4 8.8  Hemoglobin 12.0 - 15.0 g/dL 12.5 12.5 12.9  Hematocrit 36.0 - 46.0 % 38.4 38.3 39.2  Platelets 150.0 - 400.0 K/uL 229.0 231.0 245.0    CMP Latest Ref Rng & Units 07/31/2021 05/12/2021 04/20/2021  Glucose 70 - 99 mg/dL 78 93 109(H)  BUN 6 - 23 mg/dL 18 18 18   Creatinine 0.40 - 1.20 mg/dL 0.80 0.98 0.93  Sodium  135 - 145 mEq/L 142 142 141  Potassium 3.5 - 5.1 mEq/L 4.1 4.4 3.9  Chloride 96 - 112 mEq/L 105 105 104  CO2 19 - 32 mEq/L 30 24 28   Calcium 8.4 - 10.5 mg/dL 8.8 9.5 8.7  Total Protein 6.0 - 8.3 g/dL 6.9 - 6.8  Total Bilirubin 0.2 - 1.2 mg/dL 0.5 - 0.5  Alkaline Phos 39 - 117 U/L 87 - 76  AST 0 - 37 U/L 20 - 29  ALT 0 - 35 U/L 30 - 43(H)     Review of systems:     No chest pain, no SOB, no fevers, no urinary sx   Past Medical History:  Diagnosis Date   Acute combined systolic and diastolic heart failure (HCC) 05/26/2015   ALLERGIC RHINITIS    Allergic urticaria 08/01/2014   Patient reports allergies to shrimp, egg whites, tomatoes, dairy    Allergy    seasonal and numerous food and drug allergies.   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   Office Spirometry 11/07/17-WNL-FVC 1.58/82%, FEV1 1.27/85%, ratio 0.80, FEF 25-75% 0.26/89%   Bradycardia 02/25/2017   Chiari malformation    Noted MRI brain 09/2013 - s/p neuro eval for same   DIVERTICULOSIS, COLON    Essential hypertension 08/28/2010   Qualifier: Diagnosis of  By: Marca Ancona RMA, Lucy      Frequent headaches    GERD    H/O measles    H/O mumps    Hearing loss 01/18/2017  History of chicken pox    Hyperglycemia 03/05/2016   HYPERTENSION    Hypocalcemia 02/25/2017   IBS (irritable bowel syndrome) 05/26/2015   Nonspecific abnormal electrocardiogram (ECG) (EKG)    OBSTRUCTIVE SLEEP APNEA 12/2008 dx   noncompliant with CPAP qhs   Obstructive sleep apnea 08/28/2010   NPSG 12/2008:  AHI 13/hr with desats to 78%    Preventative health care 06/11/2017   Sciatica    right side   Seasonal and perennial allergic rhinitis 08/28/2010   Allergy profile 03/11/14- positive especially for dust, cat and dog Food allergy profile- total IgE 86 with several food group elevations Sed rate-WNL      Sinusitis 06/06/2017   Sleep apnea    uses cpap   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   Vaginitis 01/18/2017   Vitamin D deficiency 06/11/2017    Patient's surgical  history, family medical history, social history, medications and allergies were all reviewed in Epic    Current Outpatient Medications  Medication Sig Dispense Refill   albuterol (VENTOLIN HFA) 108 (90 Base) MCG/ACT inhaler INHALE 2 PUFFS BY MOUTH EVERY 6 HOURS AS NEEDED FOR WHEEZING OR SHORTNESS OF BREATH 18 g 2   ALPRAZolam (XANAX) 0.5 MG tablet TAKE 1 TABLET BY MOUTH TWICE DAILY AS NEEDED FOR ANXIETY AND FOR SLEEP 20 tablet 1   amLODipine (NORVASC) 2.5 MG tablet Take 1 tablet by mouth twice daily 180 tablet 3   aspirin EC 81 MG tablet Take 81 mg by mouth daily. Swallow whole.     azelastine (ASTELIN) 0.1 % nasal spray Place 2 sprays into both nostrils 2 (two) times daily as needed for rhinitis. 30 mL 5   b complex vitamins tablet Take 1 tablet by mouth daily.     Cholecalciferol (VITAMIN D) 2000 UNITS CAPS Take by mouth daily.     Cholecalciferol (VITAMIN D) 50 MCG (2000 UT) tablet Take 2,000 Units by mouth daily.     EPINEPHrine 0.3 mg/0.3 mL IJ SOAJ injection Inject 0.3 mg into the muscle as needed for anaphylaxis. 2 each 1   famotidine (PEPCID) 40 MG tablet Take 1 tablet (40 mg total) by mouth 2 (two) times daily. 180 tablet 1   furosemide (LASIX) 20 MG tablet Take 1 tablet (20 mg total) by mouth 3 (three) times a week. Monday, Wednesday and Friday. 90 tablet 3   levocetirizine (XYZAL) 5 MG tablet TAKE 1 TABLET BY MOUTH ONCE DAILY IN THE EVENING 30 tablet 5   losartan (COZAAR) 100 MG tablet Take 1 tablet by mouth once daily 90 tablet 1   METAMUCIL FIBER PO Take by mouth. Take 1 spoon once daily     montelukast (SINGULAIR) 10 MG tablet Take 1 tablet (10 mg total) by mouth at bedtime. 90 tablet 1   Multiple Vitamin (MULTIVITAMIN) tablet Take 1 tablet by mouth daily.     olopatadine (PATANOL) 0.1 % ophthalmic solution Place 1 drop into both eyes 2 (two) times daily. 5 mL 5   omeprazole (PRILOSEC) 20 MG capsule Take 1 capsule (20 mg total) by mouth daily. 90 capsule 1   rosuvastatin  (CRESTOR) 10 MG tablet Take 1 tablet by mouth once daily 90 tablet 3   spironolactone (ALDACTONE) 25 MG tablet Take 1 tablet (25 mg total) by mouth daily. 90 tablet 3   No current facility-administered medications for this visit.    Physical Exam:     BP 140/68    Pulse 75    Ht 4\' 11"  (1.499 m)  Wt 235 lb 2 oz (106.7 kg)    SpO2 97%    BMI 47.49 kg/m   GENERAL:  Pleasant female in NAD PSYCH: : Cooperative, normal affect EENT:  conjunctiva pink, mucous membranes moist, neck supple without masses CARDIAC:  RRR, no murmur heard, no peripheral edema PULM: Normal respiratory effort, lungs CTA bilaterally, no wheezing ABDOMEN:  Nondistended, soft, nontender. No obvious masses, no hepatomegaly,  normal bowel sounds SKIN:  turgor, no lesions seen Musculoskeletal:  Normal muscle tone, normal strength NEURO: Alert and oriented x 3, no focal neurologic deficits   IMPRESSION and PLAN:    1) GERD 2) Atypical, noncardiac chest pain 3) Dyspepsia 4) Abdominal bloating - Has been evaluated in the Cardiology Clinic earlier this month with no need for ischemia work-up - Increase Prilosec to 40 mg BID x4 weeks, and if symptoms better controlled, plan to wean Pepcid down first, then Prilosec to lowest effective dose - EGD at [facility] to evaluate for erosive esophagitis, LES laxity, hiatal hernia along with PUD, gastritis - If no change with trial of high-dose PPI and EGD unrevealing, will plan for EM and pH/Mii (off meds)  5) Nonobstructive CAD 6) Chronic diastolic heart failure 7) Obesity 8) History of Chiari 1 malformation - Procedure to be scheduled at [facility] due to elevated periprocedural risks  9) History of colon polyps - Could consider repeat colonoscopy in 2029 for surveillance depending on overall health and patient wishes at that time     The indications, risks, and benefits of EGD were explained to the patient in detail. Risks include but are not limited to bleeding,  perforation, adverse reaction to medications, and cardiopulmonary compromise. Sequelae include but are not limited to the possibility of surgery, hospitalization, and mortality. The patient verbalized understanding and wished to proceed. All questions answered, referred to scheduler. Further recommendations pending results of the exam.       Anne Barnes ,DO, FACG 10/31/2021, 11:35 AM

## 2021-11-03 ENCOUNTER — Ambulatory Visit (INDEPENDENT_AMBULATORY_CARE_PROVIDER_SITE_OTHER): Payer: Medicare HMO

## 2021-11-03 DIAGNOSIS — J309 Allergic rhinitis, unspecified: Secondary | ICD-10-CM

## 2021-11-07 ENCOUNTER — Encounter: Payer: Self-pay | Admitting: Family Medicine

## 2021-11-07 ENCOUNTER — Ambulatory Visit (INDEPENDENT_AMBULATORY_CARE_PROVIDER_SITE_OTHER): Payer: Medicare HMO | Admitting: Family Medicine

## 2021-11-07 VITALS — BP 138/87 | HR 69 | Ht 59.0 in | Wt 235.4 lb

## 2021-11-07 DIAGNOSIS — H6591 Unspecified nonsuppurative otitis media, right ear: Secondary | ICD-10-CM

## 2021-11-07 MED ORDER — FLUTICASONE PROPIONATE 50 MCG/ACT NA SUSP: 2.0000 | Freq: Every day | NASAL | 0 refills | Status: DC

## 2021-11-07 NOTE — Patient Instructions (Addendum)
Start Flonase daily. Continue Allergy regimen.  If no improvement after trying this for a few weeks, we can consider oral prednisone.  See handout attached.

## 2021-11-07 NOTE — Progress Notes (Signed)
Acute Office Visit  Subjective:    Patient ID: Anne Barnes, female    DOB: 1946/01/28, 76 y.o.   MRN: [id number]  CC: Fluid in ear   HPI Patient is in today for "fluid in her ear."  Patient states that she has had 2-3 days of feeling like she has fluid in her right ear. Reports she has environmental allergies and has been doing allergy shots for the past 17 months - is starting to notice some improvement. She feels like this sensation is messing up her equilibrium. Hearing is at baseline - has diminished for awhile, but has been told is likely allergy related. Follows with allergist and ENT. She denies any ear pain, fevers, drainage, URI symptoms, dizziness/spinning.       Past Medical History:  Diagnosis Date   Acute combined systolic and diastolic heart failure ([place]) 05/26/2015   ALLERGIC RHINITIS    Allergic urticaria 08/01/2014   Patient reports allergies to shrimp, egg whites, tomatoes, dairy    Allergy    seasonal and numerous food and drug allergies.   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   Office Spirometry 11/07/17-WNL-FVC 1.58/82%, FEV1 1.27/85%, ratio 0.80, FEF 25-75% 0.26/89%   Bradycardia 02/25/2017   Chiari malformation    Noted MRI brain 09/2013 - s/p neuro eval for same   DIVERTICULOSIS, COLON    Essential hypertension 08/28/2010   Qualifier: Diagnosis of  By: Marca Ancona RMA, Lucy      Frequent headaches    GERD    H/O measles    H/O mumps    Hearing loss 01/18/2017   History of chicken pox    Hyperglycemia 03/05/2016   HYPERTENSION    Hypocalcemia 02/25/2017   IBS (irritable bowel syndrome) 05/26/2015   Nonspecific abnormal electrocardiogram (ECG) (EKG)    OBSTRUCTIVE SLEEP APNEA 12/2008 dx   noncompliant with CPAP qhs   Obstructive sleep apnea 08/28/2010   NPSG 12/2008:  AHI 13/hr with desats to 78%    Preventative health care 06/11/2017   Sciatica    right side   Seasonal and perennial allergic rhinitis 08/28/2010   Allergy profile 03/11/14- positive especially for  dust, cat and dog Food allergy profile- total IgE 86 with several food group elevations Sed rate-WNL      Sinusitis 06/06/2017   Sleep apnea    uses cpap   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   Vaginitis 01/18/2017   Vitamin D deficiency 06/11/2017    Past Surgical History:  Procedure Laterality Date   [place]   Breast biopsy   COLONOSCOPY  2011   Dr Collene Mares   UMBILICAL HERNIA REPAIR      Family History  Problem Relation Age of Onset   Arthritis Mother        died of complications from hip surgery   Arthritis Father    Kidney disease Father    Pneumonia Father    Heart disease Father    Lupus Daughter    Rheum arthritis Daughter    Sjogren's syndrome Daughter    Fibromyalgia Daughter    Diabetes Brother    Heart disease Brother    Heart attack Brother    Alcohol abuse Brother    Throat cancer Brother    Lung cancer Brother    Kidney disease Sister        dialysis   Obesity Sister    Hypertension Maternal Grandfather    Hypertension Sister    Kidney disease  Sister        dialysis   Pneumonia Brother    Hypertension Daughter    Berenice Primas' disease Daughter    Hypertension Daughter    Alcohol abuse Other        parent   Ataxia Neg Hx    Chorea Neg Hx    Dementia Neg Hx    Mental retardation Neg Hx    Migraines Neg Hx    Multiple sclerosis Neg Hx    Neurofibromatosis Neg Hx    Neuropathy Neg Hx    Parkinsonism Neg Hx    Seizures Neg Hx    Stroke Neg Hx    Colon cancer Neg Hx    Colon polyps Neg Hx    Esophageal cancer Neg Hx    Rectal cancer Neg Hx    Stomach cancer Neg Hx     Social History   Socioeconomic History   Marital status: Widowed    Spouse name: Not on file   Number of children: Not on file   Years of education: Not on file   Highest education level: Not on file  Occupational History   Occupation: retired  Tobacco Use   Smoking status: Never   Smokeless tobacco: Never   Tobacco comments:    Married,  lives with spouse, dtr 2 g-kids.  Vaping Use   Vaping Use: Never used  Substance and Sexual Activity   Alcohol use: No    Comment: rare   Drug use: No   Sexual activity: Not Currently    Comment: lives with husband, no dietary restrictions avoid foods allergic to, retired  Other Topics Concern   Not on file  Social History Narrative   Right handed   One story with daughter and two grandkids   Social Determinants of Health   Financial Resource Strain: Low Risk    Difficulty of Paying Living Expenses: Not hard at all  Food Insecurity: No Food Insecurity   Worried About Charity fundraiser in the Last Year: Never true   Arboriculturist in the Last Year: Never true  Transportation Needs: No Transportation Needs   Lack of Transportation (Medical): No   Lack of Transportation (Non-Medical): No  Physical Activity: Sufficiently Active   Days of Exercise per Week: 7 days   Minutes of Exercise per Session: 40 min  Stress: No Stress Concern Present   Feeling of Stress : Not at all  Social Connections: Moderately Isolated   Frequency of Communication with Friends and Family: More than three times a week   Frequency of Social Gatherings with Friends and Family: More than three times a week   Attends Religious Services: More than 4 times per year   Active Member of [employer] or Organizations: No   Attends Archivist Meetings: Never   Marital Status: Widowed  Human resources officer Violence: Not At Risk   Fear of Current or Ex-Partner: No   Emotionally Abused: No   Physically Abused: No   Sexually Abused: No    Outpatient Medications Prior to Visit  Medication Sig Dispense Refill   albuterol (VENTOLIN HFA) 108 (90 Base) MCG/ACT inhaler INHALE 2 PUFFS BY MOUTH EVERY 6 HOURS AS NEEDED FOR WHEEZING OR SHORTNESS OF BREATH 18 g 2   ALPRAZolam (XANAX) 0.5 MG tablet TAKE 1 TABLET BY MOUTH TWICE DAILY AS NEEDED FOR ANXIETY AND FOR SLEEP 20 tablet 1   amLODipine (NORVASC) 2.5 MG tablet Take 1  tablet by mouth twice daily 180 tablet  3   aspirin EC 81 MG tablet Take 81 mg by mouth daily. Swallow whole.     azelastine (ASTELIN) 0.1 % nasal spray Place 2 sprays into both nostrils 2 (two) times daily as needed for rhinitis. 30 mL 5   b complex vitamins tablet Take 1 tablet by mouth daily.     Cholecalciferol (VITAMIN D) 2000 UNITS CAPS Take by mouth daily.     Cholecalciferol (VITAMIN D) 50 MCG (2000 UT) tablet Take 2,000 Units by mouth daily.     EPINEPHrine 0.3 mg/0.3 mL IJ SOAJ injection Inject 0.3 mg into the muscle as needed for anaphylaxis. 2 each 1   famotidine (PEPCID) 40 MG tablet Take 1 tablet (40 mg total) by mouth 2 (two) times daily. 180 tablet 1   furosemide (LASIX) 20 MG tablet Take 1 tablet (20 mg total) by mouth 3 (three) times a week. Monday, Wednesday and Friday. 90 tablet 3   levocetirizine (XYZAL) 5 MG tablet TAKE 1 TABLET BY MOUTH ONCE DAILY IN THE EVENING 30 tablet 5   losartan (COZAAR) 100 MG tablet Take 1 tablet by mouth once daily 90 tablet 1   METAMUCIL FIBER PO Take by mouth. Take 1 spoon once daily     montelukast (SINGULAIR) 10 MG tablet Take 1 tablet (10 mg total) by mouth at bedtime. 90 tablet 1   Multiple Vitamin (MULTIVITAMIN) tablet Take 1 tablet by mouth daily.     olopatadine (PATANOL) 0.1 % ophthalmic solution Place 1 drop into both eyes 2 (two) times daily. 5 mL 5   omeprazole (PRILOSEC) 40 MG capsule Take 1 capsule (40 mg total) by mouth 2 (two) times daily. 90 capsule 3   rosuvastatin (CRESTOR) 10 MG tablet Take 1 tablet by mouth once daily 90 tablet 3   spironolactone (ALDACTONE) 25 MG tablet Take 1 tablet (25 mg total) by mouth daily. 90 tablet 3   No facility-administered medications prior to visit.    Allergies  Allergen Reactions   Codeine Nausea And Vomiting   Eggs Or Egg-Derived Products    Hydralazine Hcl Other (See Comments)    Pt reports causes dizziness   Lisinopril-Hydrochlorothiazide Hives    REACTION: lip swelling,facial  swelling,rash.   Metoprolol Other (See Comments)    Does not feel well on it    Other Cough    Patient states she is allergic to Shrimp, egg whites, tomatoes and dairy products.  Says they make her feel unwell but do not cause hives, SOB or anaphylaxis.  She just avoids eating them because they can make her have a headache.  Strawberries, watermelon, peanuts, and all shellfish.     Shrimp [Shellfish Allergy] Other (See Comments)    Was allergy tested and showed shrimp was one--has not reacted b/c avoided   Tomato Other (See Comments)    Review of Systems All review of systems negative except what is listed in the HPI     Objective:    Physical Exam Vitals reviewed.  Constitutional:      Appearance: Normal appearance. She is obese.  HENT:     Right Ear: A middle ear effusion is present. Tympanic membrane is not erythematous or bulging.     Left Ear: Tympanic membrane normal.  Musculoskeletal:     Cervical back: Normal range of motion and neck supple. No tenderness.  Lymphadenopathy:     Cervical: No cervical adenopathy.  Skin:    General: Skin is warm and dry.  Neurological:     General:  No focal deficit present.     Mental Status: She is alert and oriented to person, place, and time. Mental status is at baseline.  Psychiatric:        Mood and Affect: Mood normal.        Behavior: Behavior normal.        Thought Content: Thought content normal.        Judgment: Judgment normal.    BP 138/87    Pulse 69    Ht _0  (1.499 m)    Wt 235 lb 6.4 oz (106.8 kg)    SpO2 98%    BMI 47.55 kg/m  Wt Readings from Last 3 Encounters:  11/07/21 235 lb 6.4 oz (106.8 kg)  10/31/21 235 lb 2 oz (106.7 kg)  10/25/21 234 lb 12.8 oz (106.5 kg)    Health Maintenance Due  Topic Date Due   COVID-19 Vaccine (4 - Booster for Moderna series) 12/01/2020    There are no preventive care reminders to display for this patient.   Lab Results  Component Value Date   TSH 0.76 07/31/2021   Lab  Results  Component Value Date   WBC 8.3 07/31/2021   HGB 12.5 07/31/2021   HCT 38.4 07/31/2021   MCV 91.0 07/31/2021   PLT 229.0 07/31/2021   Lab Results  Component Value Date   NA 142 07/31/2021   K 4.1 07/31/2021   CO2 30 07/31/2021   GLUCOSE 78 07/31/2021   BUN 18 07/31/2021   CREATININE 0.80 07/31/2021   BILITOT 0.5 07/31/2021   ALKPHOS 87 07/31/2021   AST 20 07/31/2021   ALT 30 07/31/2021   PROT 6.9 07/31/2021   ALBUMIN 3.9 07/31/2021   CALCIUM 8.8 07/31/2021   ANIONGAP 9 09/15/2017   EGFR 61 05/12/2021   GFR 72.86 07/31/2021   Lab Results  Component Value Date   CHOL 127 07/31/2021   Lab Results  Component Value Date   HDL 45.40 07/31/2021   Lab Results  Component Value Date   LDLCALC 73 07/31/2021   Lab Results  Component Value Date   TRIG 41.0 07/31/2021   Lab Results  Component Value Date   CHOLHDL 3 07/31/2021   Lab Results  Component Value Date   HGBA1C 5.7 07/31/2021       Assessment & Plan:   1. Fluid level behind tympanic membrane of right ear Start Flonase daily. Continue Allergy regimen.  If no improvement after trying this for a few weeks, we can consider oral prednisone.  See handout attached.   - fluticasone (FLONASE) 50 MCG/ACT nasal spray; Place 2 sprays into both nostrils daily.  Dispense: 1 g; Refill: 0   Follow-up if symptoms worsen or fail to improve.   Terrilyn Saver, NP

## 2021-11-09 ENCOUNTER — Other Ambulatory Visit: Payer: Self-pay | Admitting: *Deleted

## 2021-11-09 MED ORDER — ROSUVASTATIN CALCIUM 10 MG PO TABS: 10.0000 mg | ORAL_TABLET | Freq: Every day | ORAL | 2 refills | Status: DC

## 2021-11-10 ENCOUNTER — Ambulatory Visit (INDEPENDENT_AMBULATORY_CARE_PROVIDER_SITE_OTHER): Payer: Medicare HMO

## 2021-11-10 DIAGNOSIS — J309 Allergic rhinitis, unspecified: Secondary | ICD-10-CM | POA: Diagnosis not present

## 2021-11-14 ENCOUNTER — Other Ambulatory Visit: Payer: Self-pay

## 2021-11-14 ENCOUNTER — Ambulatory Visit (INDEPENDENT_AMBULATORY_CARE_PROVIDER_SITE_OTHER): Payer: Medicare HMO

## 2021-11-14 ENCOUNTER — Ambulatory Visit: Payer: Medicare HMO | Admitting: Podiatry

## 2021-11-14 DIAGNOSIS — B351 Tinea unguium: Secondary | ICD-10-CM | POA: Diagnosis not present

## 2021-11-14 DIAGNOSIS — J309 Allergic rhinitis, unspecified: Secondary | ICD-10-CM

## 2021-11-14 DIAGNOSIS — M79675 Pain in left toe(s): Secondary | ICD-10-CM

## 2021-11-14 DIAGNOSIS — M79674 Pain in right toe(s): Secondary | ICD-10-CM | POA: Diagnosis not present

## 2021-11-18 NOTE — Progress Notes (Signed)
Subjective: 76 y.o. returns the office today for painful, elongated, thickened toenails which she cannot trim herself. Denies any redness or drainage around the nails.  She states the pain that she was getting to her ankles is "a lot better".  No recent injury or changes otherwise.  She has no other concerns.  PCP: Mosie Lukes, MD A1c: 5.7 on July 31, 2021  Objective: AAO 3, NAD DP/PT pulses palpable, CRT less than 3 seconds Protective sensation intact with Derrel Nip monofilament Nails hypertrophic, dystrophic, elongated, brittle, discolored 10. There is tenderness overlying the nails 1-5 bilaterally. There is no surrounding erythema or drainage along the nail sites. No open lesions or pre-ulcerative lesions are identified. No specific area of pinpoint tenderness noted.  Ankle, subtalar joint range of motion intact.  Upon palpation of the left sinus tarsi is where she does get occasional discomfort but no significant pain on exam today. No other areas of tenderness bilateral lower extremities. No overlying edema, erythema, increased warmth. No pain with calf compression, swelling, warmth, erythema.  Assessment: Patient presents with symptomatic onychomycosis; and, capsulitis  Plan: -Treatment options including alternatives, risks, complications were discussed -Nails sharply debrided 10 without complication/bleeding. -Continue supportive shoe gear.  Should symptoms recur This note. -Discussed daily foot inspection. If there are any changes, to call the office immediately.  -Follow-up in 3 months or sooner if any problems are to arise. In the meantime, encouraged to call the office with any questions, concerns, changes symptoms.  Celesta Gentile, DPM

## 2021-11-23 NOTE — Progress Notes (Unsigned)
NEUROLOGY FOLLOW UP OFFICE NOTE  Anne Barnes [id number]  Assessment/Plan:   1.  Chronic daily headache, resolved 2.  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  OSA   Doing well off of medication Follow up one year  Subjective:  Anne Barnes is a 76 year old right-handed woman with history of OSA, chronic sinusitis, and hypertension who follows up for headache and dizziness.   UPDATE: ***     HISTORY: She has had headaches for several years, which she attributes to chronic sinusitis.  I saw her back in late 2014.  At that time, she  described as 10/10 bifrontal and left temporal pounding headache lasting several seconds and triggered by coughing or sneezing but not usually by bending over.  She had an underlying constant aching that occurred almost daily.  She also had left sided neck pain radiating up to behind her left ear but not into the arm.  No associated visual disturbance, nausea, vomiting, photophobia, phonophobia, or dizziness.  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 She deferred evaluation by neurosurgery.  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  She was on indomethacin for a time with variable efficacy.  She was last seen in 2015.  For ongoing headache and neck pain, she underwent CT head and cervical spine on 02/19/2017 which was personally reviewed and again demonstrated Chiari with tonsillar ectopia 13 mm below foramen magnum and mild degenerative changes at C2-3 and C3-4 on the left and C4-5 and C5-T2.  However, she also demonstrated ventriculomegaly as well.  Symptoms particularly got worse since her husband passed away in Aug 15, 2019.  In January 2020, she started experiencing severe right sided headaches as well.  They are infrequent, maybe  occurring once a month.  She continues to have a dull daily headache.  She wakes up in the morning with it and it resolves by the afternoon. Neck pain is not too much of an issue.  Sometimes it feels still which she attributes to sleeping in a recliner.  No associated visual disturbance, nausea, vomiting, numbness or weakness.  She reports increased dizziness and anxiety.  Dizziness is described as a lightheadedness, rarely with spinning, and occurs only when she is standing and moving about.  It does not occur while sitting or at rest.  Sometimes she feels like she may pass out but that has not happened.  She also reports worsening hearing and tinnitus in her right ear.  No double vision or trouble swallowing.  She was evaluated by ENT.  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  Hearing aids were recommended.  CT of paranasal sinuses were clear.  She was advised to see cardiology for evaluation of heart murmur.  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  14 day Zio patch demonstrated sinus bradycardia to sinus tachycardia (39 bpm to 167 bpm with average of 57 bpm) and very short episodes of SVT but no pauses or atrial fibrillation.  She is treated for OSA with CPAP.  Pulmonology treated her for bronchitis.  She was seen in the ED on 11/24/2019 for headache.  CT head personally reviewed showed stable age-related atrophy and ventriculomegaly but without apparent crowding of the structures in the posterior fossa and no acute abnormality.  She has continued to experience rhinorrhea, cough and nasal congestion  related to allergies.  MRI of brain without contrast on 01/27/2020 confirmed Chiari 1 malformation but no crowding or abnormal morphology of brain structures within the posterior fossa.Marland Kitchen  She was started on topiramate in April 2021 but headaches resolved and she discontinued it after a couple of weeks.   Past medications:  Fioricet, topiramate  PAST MEDICAL HISTORY: Past  Medical History:  Diagnosis Date   Acute combined systolic and diastolic heart failure ([place]) 05/26/2015   ALLERGIC RHINITIS    Allergic urticaria 08/01/2014   Patient reports allergies to shrimp, egg whites, tomatoes, dairy    Allergy    seasonal and numerous food and drug allergies.   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   Office Spirometry 11/07/17-WNL-FVC 1.58/82%, FEV1 1.27/85%, ratio 0.80, FEF 25-75% 0.26/89%   Bradycardia 02/25/2017   Chiari malformation    Noted MRI brain 09/2013 - s/p neuro eval for same   DIVERTICULOSIS, COLON    Essential hypertension 08/28/2010   Qualifier: Diagnosis of  By: Marca Ancona RMA, Lucy      Frequent headaches    GERD    H/O measles    H/O mumps    Hearing loss 01/18/2017   History of chicken pox    Hyperglycemia 03/05/2016   HYPERTENSION    Hypocalcemia 02/25/2017   IBS (irritable bowel syndrome) 05/26/2015   Nonspecific abnormal electrocardiogram (ECG) (EKG)    OBSTRUCTIVE SLEEP APNEA 12/2008 dx   noncompliant with CPAP qhs   Obstructive sleep apnea 08/28/2010   NPSG 12/2008:  AHI 13/hr with desats to 78%    Preventative health care 06/11/2017   Sciatica    right side   Seasonal and perennial allergic rhinitis 08/28/2010   Allergy profile 03/11/14- positive especially for dust, cat and dog Food allergy profile- total IgE 86 with several food group elevations Sed rate-WNL      Sinusitis 06/06/2017   Sleep apnea    uses cpap   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   Vaginitis 01/18/2017   Vitamin D deficiency 06/11/2017    MEDICATIONS: Current Outpatient Medications on File Prior to Visit  Medication Sig Dispense Refill   albuterol (VENTOLIN HFA) 108 (90 Base) MCG/ACT inhaler INHALE 2 PUFFS BY MOUTH EVERY 6 HOURS AS NEEDED FOR WHEEZING OR SHORTNESS OF BREATH 18 g 2   ALPRAZolam (XANAX) 0.5 MG tablet TAKE 1 TABLET BY MOUTH TWICE DAILY AS NEEDED FOR ANXIETY AND FOR SLEEP 20 tablet 1   amLODipine (NORVASC) 2.5 MG tablet Take 1 tablet by mouth twice daily 180 tablet 3    aspirin EC 81 MG tablet Take 81 mg by mouth daily. Swallow whole.     azelastine (ASTELIN) 0.1 % nasal spray Place 2 sprays into both nostrils 2 (two) times daily as needed for rhinitis. 30 mL 5   b complex vitamins tablet Take 1 tablet by mouth daily.     Cholecalciferol (VITAMIN D) 2000 UNITS CAPS Take by mouth daily.     Cholecalciferol (VITAMIN D) 50 MCG (2000 UT) tablet Take 2,000 Units by mouth daily.     EPINEPHrine 0.3 mg/0.3 mL IJ SOAJ injection Inject 0.3 mg into the muscle as needed for anaphylaxis. 2 each 1   famotidine (PEPCID) 40 MG tablet Take 1 tablet (40 mg total) by mouth 2 (two) times daily. 180 tablet 1   fluticasone (FLONASE) 50 MCG/ACT nasal spray Place 2 sprays into both nostrils daily. 1 g 0   furosemide (LASIX) 20 MG tablet Take 1 tablet (20 mg total) by mouth 3 (three)  times a week. Monday, Wednesday and Friday. 90 tablet 3   levocetirizine (XYZAL) 5 MG tablet TAKE 1 TABLET BY MOUTH ONCE DAILY IN THE EVENING 30 tablet 5   losartan (COZAAR) 100 MG tablet Take 1 tablet by mouth once daily 90 tablet 1   METAMUCIL FIBER PO Take by mouth. Take 1 spoon once daily     montelukast (SINGULAIR) 10 MG tablet Take 1 tablet (10 mg total) by mouth at bedtime. 90 tablet 1   Multiple Vitamin (MULTIVITAMIN) tablet Take 1 tablet by mouth daily.     olopatadine (PATANOL) 0.1 % ophthalmic solution Place 1 drop into both eyes 2 (two) times daily. 5 mL 5   omeprazole (PRILOSEC) 40 MG capsule Take 1 capsule (40 mg total) by mouth 2 (two) times daily. 90 capsule 3   rosuvastatin (CRESTOR) 10 MG tablet Take 1 tablet (10 mg total) by mouth daily. 90 tablet 2   spironolactone (ALDACTONE) 25 MG tablet Take 1 tablet (25 mg total) by mouth daily. 90 tablet 3   No current facility-administered medications on file prior to visit.    ALLERGIES: Allergies  Allergen Reactions   Codeine Nausea And Vomiting   Eggs Or Egg-Derived Products    Hydralazine Hcl Other (See Comments)    Pt reports causes  dizziness   Lisinopril-Hydrochlorothiazide Hives    REACTION: lip swelling,facial swelling,rash.   Metoprolol Other (See Comments)    Does not feel well on it    Other Cough    Patient states she is allergic to Shrimp, egg whites, tomatoes and dairy products.  Says they make her feel unwell but do not cause hives, SOB or anaphylaxis.  She just avoids eating them because they can make her have a headache.  Strawberries, watermelon, peanuts, and all shellfish.     Shrimp [Shellfish Allergy] Other (See Comments)    Was allergy tested and showed shrimp was one--has not reacted b/c avoided   Tomato Other (See Comments)    FAMILY HISTORY: Family History  Problem Relation Age of Onset   Arthritis Mother        died of complications from hip surgery   Arthritis Father    Kidney disease Father    Pneumonia Father    Heart disease Father    Lupus Daughter    Rheum arthritis Daughter    Sjogren's syndrome Daughter    Fibromyalgia Daughter    Diabetes Brother    Heart disease Brother    Heart attack Brother    Alcohol abuse Brother    Throat cancer Brother    Lung cancer Brother    Kidney disease Sister        dialysis   Obesity Sister    Hypertension Maternal Grandfather    Hypertension Sister    Kidney disease Sister        dialysis   Pneumonia Brother    Hypertension Daughter    Graves' disease Daughter    Hypertension Daughter    Alcohol abuse Other        parent   Ataxia Neg Hx    [place] Neg Hx    Dementia Neg Hx    Mental retardation Neg Hx    Migraines Neg Hx    Multiple sclerosis Neg Hx    Neurofibromatosis Neg Hx    Neuropathy Neg Hx    Parkinsonism Neg Hx    Seizures Neg Hx    Stroke Neg Hx    Colon cancer Neg Hx  Colon polyps Neg Hx    Esophageal cancer Neg Hx    Rectal cancer Neg Hx    Stomach cancer Neg Hx       Objective:  *** General: No acute distress.  Patient appears ***-groomed.   Head:  Normocephalic/atraumatic Eyes:  Fundi examined but not  visualized Neck: supple, no paraspinal tenderness, full range of motion Heart:  Regular rate and rhythm Lungs:  Clear to auscultation bilaterally Back: No paraspinal tenderness Neurological Exam: alert and oriented to person, place, and time.  Speech fluent and not dysarthric, language intact.  CN II-XII intact. Bulk and tone normal, muscle strength 5/5 throughout.  Sensation to light touch intact.  Deep tendon reflexes 2+ throughout, toes downgoing.  Finger to nose testing intact.  Gait normal, Romberg negative.   Metta Clines, DO  CC: ***

## 2021-11-24 ENCOUNTER — Ambulatory Visit: Payer: Medicare HMO | Admitting: Neurology

## 2021-12-07 ENCOUNTER — Ambulatory Visit (INDEPENDENT_AMBULATORY_CARE_PROVIDER_SITE_OTHER): Payer: Medicare HMO

## 2021-12-07 DIAGNOSIS — J309 Allergic rhinitis, unspecified: Secondary | ICD-10-CM | POA: Diagnosis not present

## 2021-12-11 NOTE — Progress Notes (Signed)
MyChart Video Visit    Virtual Visit via Video Note   This visit type was conducted due to national recommendations for restrictions regarding the COVID-19 Pandemic (e.g. social distancing) in an effort to limit this patient's exposure and mitigate transmission in our community. This patient is at least at moderate risk for complications without adequate follow up. This format is felt to be most appropriate for this patient at this time. Physical exam was limited by quality of the video and audio technology used for the visit. Tomeia Frierson-Sandells, CMA was able to get the patient set up on a video visit.  Patient location: home Patient and provider in visit Provider location: Office  I discussed the limitations of evaluation and management by telemedicine and the availability of in person appointments. The patient expressed understanding and agreed to proceed.  Visit Date: 12/12/2021  Today's healthcare provider: Danise Edge, MD     Subjective:    Patient ID: Anne Barnes, female    DOB: August 04, 1946, 76 y.o.   MRN: [id number]  Chief Complaint  Patient presents with   Follow-up    HPI Patient doing a virtual visit for a follow up on chronic conditions. She denies any recent febrile illness or hospitalizations. She acknowledges she has not been eating a heart healthy diet consistently. She hs been under a great deal of stress. She is willing to start medications for her osteoporosis and for weight management. .sbh  Past Medical History:  Diagnosis Date   Acute combined systolic and diastolic heart failure (HCC) 05/26/2015   ALLERGIC RHINITIS    Allergic urticaria 08/01/2014   Patient reports allergies to shrimp, egg whites, tomatoes, dairy    Allergy    seasonal and numerous food and drug allergies.   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   Office Spirometry 11/07/17-WNL-FVC 1.58/82%, FEV1 1.27/85%, ratio 0.80, FEF 25-75% 0.26/89%   Bradycardia 02/25/2017   Chiari malformation     Noted MRI brain 09/2013 - s/p neuro eval for same   DIVERTICULOSIS, COLON    Essential hypertension 08/28/2010   Qualifier: Diagnosis of  By: Charlsie Quest RMA, Lucy      Frequent headaches    GERD    H/O measles    H/O mumps    Hearing loss 01/18/2017   History of chicken pox    Hyperglycemia 03/05/2016   HYPERTENSION    Hypocalcemia 02/25/2017   IBS (irritable bowel syndrome) 05/26/2015   Nonspecific abnormal electrocardiogram (ECG) (EKG)    OBSTRUCTIVE SLEEP APNEA 12/2008 dx   noncompliant with CPAP qhs   Obstructive sleep apnea 08/28/2010   NPSG 12/2008:  AHI 13/hr with desats to 78%    Preventative health care 06/11/2017   Sciatica    right side   Seasonal and perennial allergic rhinitis 08/28/2010   Allergy profile 03/11/14- positive especially for dust, cat and dog Food allergy profile- total IgE 86 with several food group elevations Sed rate-WNL      Sinusitis 06/06/2017   Sleep apnea    uses cpap   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   Vaginitis 01/18/2017   Vitamin D deficiency 06/11/2017    Past Surgical History:  Procedure Laterality Date   ABDOMINAL HYSTERECTOMY  1986   BREAST SURGERY  1973   Breast biopsy   COLONOSCOPY  2011   Dr Loreta Ave   UMBILICAL HERNIA REPAIR      Family History  Problem Relation Age of Onset   Arthritis Mother        died  of complications from hip surgery   Arthritis Father    Kidney disease Father    Pneumonia Father    Heart disease Father    Lupus Daughter    Rheum arthritis Daughter    Sjogren's syndrome Daughter    Fibromyalgia Daughter    Diabetes Brother    Heart disease Brother    Heart attack Brother    Alcohol abuse Brother    Throat cancer Brother    Lung cancer Brother    Kidney disease Sister        dialysis   Obesity Sister    Hypertension Maternal Grandfather    Hypertension Sister    Kidney disease Sister        dialysis   Pneumonia Brother    Hypertension Daughter    Graves' disease Daughter    Hypertension Daughter     Alcohol abuse Other        parent   Ataxia Neg Hx    [place] Neg Hx    Dementia Neg Hx    Mental retardation Neg Hx    Migraines Neg Hx    Multiple sclerosis Neg Hx    Neurofibromatosis Neg Hx    Neuropathy Neg Hx    Parkinsonism Neg Hx    Seizures Neg Hx    Stroke Neg Hx    Colon cancer Neg Hx    Colon polyps Neg Hx    Esophageal cancer Neg Hx    Rectal cancer Neg Hx    Stomach cancer Neg Hx     Social History   Socioeconomic History   Marital status: Widowed    Spouse name: Not on file   Number of children: Not on file   Years of education: Not on file   Highest education level: Not on file  Occupational History   Occupation: retired  Tobacco Use   Smoking status: Never   Smokeless tobacco: Never   Tobacco comments:    Married, lives with spouse, dtr 2 g-kids.  Vaping Use   Vaping Use: Never used  Substance and Sexual Activity   Alcohol use: No    Comment: rare   Drug use: No   Sexual activity: Not Currently    Comment: lives with husband, no dietary restrictions avoid foods allergic to, retired  Other Topics Concern   Not on file  Social History Narrative   Right handed   One story with daughter and two grandkids   Social Determinants of Health   Financial Resource Strain: Low Risk    Difficulty of Paying Living Expenses: Not hard at all  Food Insecurity: No Food Insecurity   Worried About Programme researcher, broadcasting/film/video in the Last Year: Never true   Barista in the Last Year: Never true  Transportation Needs: No Transportation Needs   Lack of Transportation (Medical): No   Lack of Transportation (Non-Medical): No  Physical Activity: Sufficiently Active   Days of Exercise per Week: 7 days   Minutes of Exercise per Session: 40 min  Stress: No Stress Concern Present   Feeling of Stress : Not at all  Social Connections: Moderately Isolated   Frequency of Communication with Friends and Family: More than three times a week   Frequency of Social Gatherings  with Friends and Family: More than three times a week   Attends Religious Services: More than 4 times per year   Active Member of [employer] or Organizations: No   Attends Banker Meetings: Never  Marital Status: Widowed  Catering manager Violence: Not At Risk   Fear of Current or Ex-Partner: No   Emotionally Abused: No   Physically Abused: No   Sexually Abused: No    Outpatient Medications Prior to Visit  Medication Sig Dispense Refill   albuterol (VENTOLIN HFA) 108 (90 Base) MCG/ACT inhaler INHALE 2 PUFFS BY MOUTH EVERY 6 HOURS AS NEEDED FOR WHEEZING OR SHORTNESS OF BREATH 18 g 2   ALPRAZolam (XANAX) 0.5 MG tablet TAKE 1 TABLET BY MOUTH TWICE DAILY AS NEEDED FOR ANXIETY AND FOR SLEEP 20 tablet 1   amLODipine (NORVASC) 2.5 MG tablet Take 1 tablet by mouth twice daily 180 tablet 3   aspirin EC 81 MG tablet Take 81 mg by mouth daily. Swallow whole.     azelastine (ASTELIN) 0.1 % nasal spray Place 2 sprays into both nostrils 2 (two) times daily as needed for rhinitis. 30 mL 5   b complex vitamins tablet Take 1 tablet by mouth daily.     Cholecalciferol (VITAMIN D) 2000 UNITS CAPS Take by mouth daily.     Cholecalciferol (VITAMIN D) 50 MCG (2000 UT) tablet Take 2,000 Units by mouth daily.     EPINEPHrine 0.3 mg/0.3 mL IJ SOAJ injection Inject 0.3 mg into the muscle as needed for anaphylaxis. 2 each 1   famotidine (PEPCID) 40 MG tablet Take 1 tablet (40 mg total) by mouth 2 (two) times daily. 180 tablet 1   fluticasone (FLONASE) 50 MCG/ACT nasal spray Place 2 sprays into both nostrils daily. 1 g 0   furosemide (LASIX) 20 MG tablet Take 1 tablet (20 mg total) by mouth 3 (three) times a week. Monday, Wednesday and Friday. 90 tablet 3   levocetirizine (XYZAL) 5 MG tablet TAKE 1 TABLET BY MOUTH ONCE DAILY IN THE EVENING 30 tablet 5   losartan (COZAAR) 100 MG tablet Take 1 tablet by mouth once daily 90 tablet 1   METAMUCIL FIBER PO Take by mouth. Take 1 spoon once daily     montelukast  (SINGULAIR) 10 MG tablet Take 1 tablet (10 mg total) by mouth at bedtime. 90 tablet 1   Multiple Vitamin (MULTIVITAMIN) tablet Take 1 tablet by mouth daily.     olopatadine (PATANOL) 0.1 % ophthalmic solution Place 1 drop into both eyes 2 (two) times daily. 5 mL 5   omeprazole (PRILOSEC) 40 MG capsule Take 1 capsule (40 mg total) by mouth 2 (two) times daily. 90 capsule 3   rosuvastatin (CRESTOR) 10 MG tablet Take 1 tablet (10 mg total) by mouth daily. 90 tablet 2   spironolactone (ALDACTONE) 25 MG tablet Take 1 tablet (25 mg total) by mouth daily. 90 tablet 3   No facility-administered medications prior to visit.    Allergies  Allergen Reactions   Codeine Nausea And Vomiting   Eggs Or Egg-Derived Products    Hydralazine Hcl Other (See Comments)    Pt reports causes dizziness   Lisinopril-Hydrochlorothiazide Hives    REACTION: lip swelling,facial swelling,rash.   Metoprolol Other (See Comments)    Does not feel well on it    Other Cough    Patient states she is allergic to Shrimp, egg whites, tomatoes and dairy products.  Says they make her feel unwell but do not cause hives, SOB or anaphylaxis.  She just avoids eating them because they can make her have a headache.  Strawberries, watermelon, peanuts, and all shellfish.     Shrimp [Shellfish Allergy] Other (See Comments)    Was  allergy tested and showed shrimp was one--has not reacted b/c avoided   Tomato Other (See Comments)    Review of Systems  Constitutional:  Negative for fever and malaise/fatigue.  HENT:  Positive for congestion.   Eyes:  Negative for blurred vision.  Respiratory:  Negative for shortness of breath.   Cardiovascular:  Negative for chest pain, palpitations and leg swelling.  Gastrointestinal:  Negative for abdominal pain, blood in stool and nausea.  Genitourinary:  Negative for dysuria and frequency.  Musculoskeletal:  Positive for myalgias and neck pain. Negative for back pain and falls.  Skin:  Negative for  rash.  Neurological:  Negative for dizziness, loss of consciousness and headaches.  Endo/Heme/Allergies:  Negative for environmental allergies.  Psychiatric/Behavioral:  Negative for depression. The patient is not nervous/anxious.       Objective:    Physical Exam Constitutional:      General: She is not in acute distress.    Appearance: She is well-developed.  HENT:     Head: Normocephalic and atraumatic.  Eyes:     Conjunctiva/sclera: Conjunctivae normal.  Neck:     Thyroid: No thyromegaly.  Cardiovascular:     Rate and Rhythm: Normal rate and regular rhythm.     Heart sounds: Normal heart sounds. No murmur heard. Pulmonary:     Effort: Pulmonary effort is normal. No respiratory distress.     Breath sounds: Normal breath sounds.  Abdominal:     General: Bowel sounds are normal. There is no distension.     Palpations: Abdomen is soft. There is no mass.     Tenderness: There is no abdominal tenderness.  Musculoskeletal:     Cervical back: Neck supple.  Lymphadenopathy:     Cervical: No cervical adenopathy.  Skin:    General: Skin is warm and dry.  Neurological:     Mental Status: She is alert and oriented to person, place, and time.  Psychiatric:        Behavior: Behavior normal.    BP 137/67 (BP Location: Left Arm, Patient Position: Sitting, Cuff Size: Normal)   Pulse 71   Resp 20   Ht 4\' 11"  (1.499 m)   Wt 230 lb (104.3 kg)   SpO2 98%   BMI 46.45 kg/m  Wt Readings from Last 3 Encounters:  12/12/21 230 lb (104.3 kg)  11/07/21 235 lb 6.4 oz (106.8 kg)  10/31/21 235 lb 2 oz (106.7 kg)    Diabetic Foot Exam - Simple   No data filed    Lab Results  Component Value Date   WBC 8.3 07/31/2021   HGB 12.5 07/31/2021   HCT 38.4 07/31/2021   PLT 229.0 07/31/2021   GLUCOSE 78 07/31/2021   CHOL 127 07/31/2021   TRIG 41.0 07/31/2021   HDL 45.40 07/31/2021   LDLCALC 73 07/31/2021   ALT 30 07/31/2021   AST 20 07/31/2021   NA 142 07/31/2021   K 4.1 07/31/2021    CL 105 07/31/2021   CREATININE 0.80 07/31/2021   BUN 18 07/31/2021   CO2 30 07/31/2021   TSH 0.76 07/31/2021   INR 1.06 01/10/2015   HGBA1C 5.7 07/31/2021    Lab Results  Component Value Date   TSH 0.76 07/31/2021   Lab Results  Component Value Date   WBC 8.3 07/31/2021   HGB 12.5 07/31/2021   HCT 38.4 07/31/2021   MCV 91.0 07/31/2021   PLT 229.0 07/31/2021   Lab Results  Component Value Date   NA 142 07/31/2021  K 4.1 07/31/2021   CO2 30 07/31/2021   GLUCOSE 78 07/31/2021   BUN 18 07/31/2021   CREATININE 0.80 07/31/2021   BILITOT 0.5 07/31/2021   ALKPHOS 87 07/31/2021   AST 20 07/31/2021   ALT 30 07/31/2021   PROT 6.9 07/31/2021   ALBUMIN 3.9 07/31/2021   CALCIUM 8.8 07/31/2021   ANIONGAP 9 09/15/2017   EGFR 61 05/12/2021   GFR 72.86 07/31/2021   Lab Results  Component Value Date   CHOL 127 07/31/2021   Lab Results  Component Value Date   HDL 45.40 07/31/2021   Lab Results  Component Value Date   LDLCALC 73 07/31/2021   Lab Results  Component Value Date   TRIG 41.0 07/31/2021   Lab Results  Component Value Date   CHOLHDL 3 07/31/2021   Lab Results  Component Value Date   HGBA1C 5.7 07/31/2021       Assessment & Plan:   Problem List Items Addressed This Visit     Essential hypertension - Primary    Well controlled, no changes to meds. Encouraged heart healthy diet such as the DASH diet and exercise as tolerated.       IBS (irritable bowel syndrome)   Hyperglycemia    hgba1c acceptable, minimize simple carbs. Increase exercise as tolerated. Continue current meds      Vitamin D deficiency    Supplement and monitor      Morbid obesity (HCC)    Encouraged DASH or MIND diet, decrease po intake and increase exercise as tolerated. Needs 7-8 hours of sleep nightly. Avoid trans fats, eat small, frequent meals every 4-5 hours with lean proteins, complex carbs and healthy fats. Minimize simple carbs, high fat foods and processed foods.  Wegovy 0.25 mg weekly x 4 weeks and can increase to 0.5 mg next week if tolerated.       Relevant Medications   Semaglutide-Weight Management (WEGOVY) 0.25 MG/0.5ML SOAJ   Hyperlipidemia    Encourage heart healthy diet such as MIND or DASH diet, increase exercise, avoid trans fats, simple carbohydrates and processed foods, consider a krill or fish or flaxseed oil cap daily. Tolerating Rosuvastatin      Neck pain on left side    Encouraged moist heat and gentle stretching as tolerated. May try NSAIDs and prescription meds as directed and report if symptoms worsen or seek immediate care. Referred for PT and [place] ordered      Relevant Orders   Ambulatory referral to Physical Therapy   DG Neck Soft Tissue   Congestion of throat   Osteoporosis    Encouraged to get adequate exercise, calcium and vitamin d intake. Agrees to start Fosamax weekly, warned regarding possible side effects and asked to report them.       Relevant Medications   alendronate (FOSAMAX) 70 MG tablet   Other Visit Diagnoses     Chest congestion           I am having Anne Barnes start on alendronate and Wegovy. I am also having her maintain her multivitamin, Vitamin D, b complex vitamins, ALPRAZolam, spironolactone, amLODipine, aspirin EC, furosemide, olopatadine, losartan, levocetirizine, famotidine, EPINEPHrine, montelukast, azelastine, albuterol, METAMUCIL FIBER PO, Vitamin D, omeprazole, fluticasone, and rosuvastatin.  Meds ordered this encounter  Medications   alendronate (FOSAMAX) 70 MG tablet    Sig: Take 1 tablet (70 mg total) by mouth once a week. Take with a full glass of water on an empty stomach.    Dispense:  4 tablet  Refill:  5   Semaglutide-Weight Management (WEGOVY) 0.25 MG/0.5ML SOAJ    Sig: Inject 0.25 mg into the skin once a week.    Dispense:  2 mL    Refill:  0    I discussed the assessment and treatment plan with the patient. The patient was provided an opportunity to ask questions  and all were answered. The patient agreed with the plan and demonstrated an understanding of the instructions.   The patient was advised to call back or seek an in-person evaluation if the symptoms worsen or if the condition fails to improve as anticipated.  I provided 30 minutes of face-to-face time during this encounter.   Danise Edge, MD [facility] [phone] (phone) [phone] (fax)  [facility]

## 2021-12-12 ENCOUNTER — Encounter: Payer: Self-pay | Admitting: Family Medicine

## 2021-12-12 ENCOUNTER — Telehealth (INDEPENDENT_AMBULATORY_CARE_PROVIDER_SITE_OTHER): Payer: Medicare HMO | Admitting: Family Medicine

## 2021-12-12 VITALS — BP 137/67 | HR 71 | Resp 20 | Ht 59.0 in | Wt 230.0 lb

## 2021-12-12 DIAGNOSIS — R6889 Other general symptoms and signs: Secondary | ICD-10-CM

## 2021-12-12 DIAGNOSIS — I10 Essential (primary) hypertension: Secondary | ICD-10-CM | POA: Diagnosis not present

## 2021-12-12 DIAGNOSIS — R0989 Other specified symptoms and signs involving the circulatory and respiratory systems: Secondary | ICD-10-CM

## 2021-12-12 DIAGNOSIS — M810 Age-related osteoporosis without current pathological fracture: Secondary | ICD-10-CM

## 2021-12-12 DIAGNOSIS — R739 Hyperglycemia, unspecified: Secondary | ICD-10-CM | POA: Diagnosis not present

## 2021-12-12 DIAGNOSIS — M542 Cervicalgia: Secondary | ICD-10-CM | POA: Diagnosis not present

## 2021-12-12 DIAGNOSIS — E559 Vitamin D deficiency, unspecified: Secondary | ICD-10-CM | POA: Diagnosis not present

## 2021-12-12 DIAGNOSIS — K589 Irritable bowel syndrome without diarrhea: Secondary | ICD-10-CM | POA: Diagnosis not present

## 2021-12-12 DIAGNOSIS — E7800 Pure hypercholesterolemia, unspecified: Secondary | ICD-10-CM

## 2021-12-12 MED ORDER — ALENDRONATE SODIUM 70 MG PO TABS: 70.0000 mg | ORAL_TABLET | ORAL | 5 refills | Status: DC

## 2021-12-12 MED ORDER — WEGOVY 0.25 MG/0.5ML ~~LOC~~ SOAJ: 0.2500 mg | SUBCUTANEOUS | 0 refills | Status: DC

## 2021-12-12 NOTE — Assessment & Plan Note (Signed)
Well controlled, no changes to meds. Encouraged heart healthy diet such as the DASH diet and exercise as tolerated.  °

## 2021-12-12 NOTE — Assessment & Plan Note (Signed)
Encouraged DASH or MIND diet, decrease po intake and increase exercise as tolerated. Needs 7-8 hours of sleep nightly. Avoid trans fats, eat small, frequent meals every 4-5 hours with lean proteins, complex carbs and healthy fats. Minimize simple carbs, high fat foods and processed foods. Wegovy 0.25 mg weekly x 4 weeks and can increase to 0.5 mg next week if tolerated.  ?

## 2021-12-13 DIAGNOSIS — R6889 Other general symptoms and signs: Secondary | ICD-10-CM | POA: Insufficient documentation

## 2021-12-13 DIAGNOSIS — M542 Cervicalgia: Secondary | ICD-10-CM | POA: Insufficient documentation

## 2021-12-13 DIAGNOSIS — M810 Age-related osteoporosis without current pathological fracture: Secondary | ICD-10-CM | POA: Insufficient documentation

## 2021-12-13 NOTE — Assessment & Plan Note (Signed)
hgba1c acceptable, minimize simple carbs. Increase exercise as tolerated. Continue current meds 

## 2021-12-13 NOTE — Assessment & Plan Note (Signed)
Encouraged to get adequate exercise, calcium and vitamin d intake. Agrees to start Fosamax weekly, warned regarding possible side effects and asked to report them.  ?

## 2021-12-13 NOTE — Assessment & Plan Note (Signed)
Encouraged moist heat and gentle stretching as tolerated. May try NSAIDs and prescription meds as directed and report if symptoms worsen or seek immediate care. Referred for PT and [place] ordered ?

## 2021-12-13 NOTE — Assessment & Plan Note (Signed)
Supplement and monitor 

## 2021-12-13 NOTE — Assessment & Plan Note (Signed)
Encourage heart healthy diet such as MIND or DASH diet, increase exercise, avoid trans fats, simple carbohydrates and processed foods, consider a krill or fish or flaxseed oil cap daily.  Tolerating Rosuvastatin 

## 2021-12-22 ENCOUNTER — Ambulatory Visit (INDEPENDENT_AMBULATORY_CARE_PROVIDER_SITE_OTHER): Payer: Medicare HMO

## 2021-12-22 DIAGNOSIS — J309 Allergic rhinitis, unspecified: Secondary | ICD-10-CM

## 2022-01-01 ENCOUNTER — Encounter: Payer: Self-pay | Admitting: Physical Therapy

## 2022-01-01 ENCOUNTER — Ambulatory Visit: Payer: Medicare HMO | Attending: Family Medicine | Admitting: Physical Therapy

## 2022-01-01 DIAGNOSIS — M542 Cervicalgia: Secondary | ICD-10-CM | POA: Insufficient documentation

## 2022-01-01 DIAGNOSIS — R293 Abnormal posture: Secondary | ICD-10-CM | POA: Insufficient documentation

## 2022-01-01 DIAGNOSIS — R252 Cramp and spasm: Secondary | ICD-10-CM | POA: Diagnosis not present

## 2022-01-01 DIAGNOSIS — M6281 Muscle weakness (generalized): Secondary | ICD-10-CM | POA: Insufficient documentation

## 2022-01-01 NOTE — Therapy (Signed)
[place] [facility] [address] ?[place], [place], [address] ?Phone: [phone]   Fax:  [phone] ? ?Physical Therapy Evaluation ? ?Patient Details  ?Name: Anne Barnes ?MRN: [id number] ?Date of Birth: 04-24-1946 ?Referring Provider (PT): Penni Homans ? ? ?Encounter Date: 01/01/2022 ? ? PT End of Session - 01/01/22 1622   ? ? Visit Number 1   ? Number of Visits 13   ? Date for PT Re-Evaluation 02/12/22   ? Authorization Type [employer]   ? Authorization Time Period 01/01/22 to 02/12/22   ? PT Start Time 1530   ? PT Stop Time 1779   ? PT Time Calculation (min) 41 min   ? Activity Tolerance Patient tolerated treatment well   ? Behavior During Therapy [facility] for tasks assessed/performed   ? ?  ?  ? ?  ? ? ?Past Medical History:  ?Diagnosis Date  ? Acute combined systolic and diastolic heart failure ([place]) 05/26/2015  ? ALLERGIC RHINITIS   ? Allergic urticaria 08/01/2014  ? Patient reports allergies to shrimp, egg whites, tomatoes, dairy   ? Allergy   ? seasonal and numerous food and drug allergies.  ? 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  ? Office Spirometry 11/07/17-WNL-FVC 1.58/82%, FEV1 1.27/85%, ratio 0.80, FEF 25-75% 0.26/89%  ? Bradycardia 02/25/2017  ? Chiari malformation   ? Noted MRI brain 09/2013 - s/p neuro eval for same  ? DIVERTICULOSIS, COLON   ? Essential hypertension 08/28/2010  ? Qualifier: Diagnosis of  By: Reatha Armour, Lucy     ? Frequent headaches   ? GERD   ? H/O measles   ? H/O mumps   ? Hearing loss 01/18/2017  ? History of chicken pox   ? Hyperglycemia 03/05/2016  ? HYPERTENSION   ? Hypocalcemia 02/25/2017  ? IBS (irritable bowel syndrome) 05/26/2015  ? Nonspecific abnormal electrocardiogram (ECG) (EKG)   ? OBSTRUCTIVE SLEEP APNEA 12/2008 dx  ? noncompliant with CPAP qhs  ? Obstructive sleep apnea 08/28/2010  ? NPSG 12/2008:  AHI 13/hr with desats to 78%   ? Preventative health care 06/11/2017  ? Sciatica   ? right side  ? Seasonal and perennial allergic rhinitis 08/28/2010  ?  Allergy profile 03/11/14- positive especially for dust, cat and dog Food allergy profile- total IgE 86 with several food group elevations Sed rate-WNL     ? Sinusitis 06/06/2017  ? Sleep apnea   ? uses cpap  ? 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  ? Vaginitis 01/18/2017  ? Vitamin D deficiency 06/11/2017  ? ? ?Past Surgical History:  ?Procedure Laterality Date  ? ABDOMINAL HYSTERECTOMY  1986  ? BREAST SURGERY  1973  ? Breast biopsy  ? COLONOSCOPY  2011  ? Dr Collene Mares  ? UMBILICAL HERNIA REPAIR    ? ? ?There were no vitals filed for this visit. ? ? ? Subjective Assessment - 01/01/22 1530   ? ? Subjective My neck has been bothering me on and off for years, the doctor finally sent me for therapy. I don't know what makes it worse. Most of the cramping is in the back and side areas, more on the left. I have good and bad days. My shoulders and arms are fine, its just my neck. Its never my whole neck at one time. Primarily at night.   ? Currently in Pain? Yes   ? Pain Score 4    ? Pain Location Neck   ? Pain Orientation Left;Posterior   ? Pain Descriptors /  Indicators Cramping   ? Pain Type Chronic pain   ? Pain Radiating Towards none   ? Pain Onset More than a month ago   ? Pain Frequency Intermittent   ? Aggravating Factors  unsure, can start out of nowhere   ? Pain Relieving Factors "twisting my neck from side to side"   ? Effect of Pain on Daily Activities moderate, cannot move neck comfortably   ? ?  ?  ? ?  ? ? ? ? ? [facility] PT Assessment - 01/01/22 0001   ? ?  ? Assessment  ? Medical Diagnosis neck pain   ? Referring Provider (PT) Penni Homans   ? Onset Date/Surgical Date --   chronic  ? Next MD Visit Charlett Blake early June   ? Prior Therapy none   ?  ? Precautions  ? Precautions None   ?  ? Restrictions  ? Weight Bearing Restrictions No   ?  ? Balance Screen  ? Has the patient fallen in the past 6 months No   ? Has the patient had a decrease in activity level because of a fear of falling?  Yes   ? Is the patient reluctant to leave their  home because of a fear of falling?  No   ?  ? Home Environment  ? Living Environment Private residence   ? Additional Comments daughter and 2 sons live with her   ?  ? Prior Function  ? Level of Independence Independent;Independent with basic ADLs   ? Vocation Retired   ? Leisure being around great grandkids and daughter   ?  ? Posture/Postural Control  ? Posture/Postural Control Postural limitations   ? Postural Limitations Forward head;Rounded Shoulders;Decreased thoracic kyphosis   ?  ? ROM / Strength  ? AROM / PROM / Strength AROM;Strength   ?  ? AROM  ? AROM Assessment Site Shoulder;Cervical;Thoracic   ? Right/Left Shoulder Right;Left   ? Right Shoulder Flexion 102 Degrees   ? Right Shoulder ABduction 120 Degrees   ? Right Shoulder Internal Rotation --   FIR T12  ? Right Shoulder External Rotation --   FER occipital lobe  ? Left Shoulder Flexion 130 Degrees   ? Left Shoulder ABduction 115 Degrees   ? Left Shoulder Internal Rotation --   FIR T12  ? Left Shoulder External Rotation --   FER occipital lobe  ? Cervical Flexion WNL   ? Cervical Extension severe limitations   ? Cervical - Right Side Bend severe limitation   ? Cervical - Left Side Bend severe limitation   ? Cervical - Right Rotation moderate limitation   ? Cervical - Left Rotation moderate limitation   ? Thoracic Flexion moderate limitation   ? Thoracic Extension severe limitation   ? Thoracic - Right Side Bend mild limitation   ? Thoracic - Left Side Bend mild limitation   ? Thoracic - Right Rotation severe limitation   ? Thoracic - Left Rotation severe limitation   ?  ? Strength  ? Strength Assessment Site Shoulder   ? Right/Left Shoulder Right;Left   ? Right Shoulder Flexion 4/5   ? Right Shoulder Extension 5/5   ? Right Shoulder ABduction 4-/5   ? Right Shoulder Internal Rotation 5/5   ? Right Shoulder External Rotation 5/5   ? Left Shoulder Flexion 4/5   ? Left Shoulder Extension 4+/5   ? Left Shoulder ABduction 4-/5   ? Left Shoulder Internal  Rotation 5/5   ?  Left Shoulder External Rotation 5/5   ?  ? Palpation  ? Palpation comment very tender and sore in B upper traps and cervical paraspinals; very large trigger points noted in upper trap musculature   ? ?  ?  ? ?  ? ? ? ? ? ? ? ? ? ? ? ? ? ?Objective measurements completed on examination: See above findings.  ? ? ? ? ? [facility] Adult PT Treatment/Exercise - 01/01/22 0001   ? ?  ? Exercises  ? Exercises Neck   ?  ? Neck Exercises: Machines for Strengthening  ? UBE (Upper Arm Bike) L2 x3 min forward/3 min backward   ?  ? Neck Exercises: Seated  ? Neck Retraction 10 reps   ? Other Seated Exercise backwards shoulder rolls 1x10, scapular retraction with ER 1x10   ?  ? Neck Exercises: Stretches  ? Upper Trapezius Stretch Right;Left;1 rep;30 seconds   ? ?  ?  ? ?  ? ? ? ? ? ? ? ? ? ? PT Education - 01/01/22 1622   ? ? Education Details Exam findings, HEP, POC   ? Person(s) Educated Patient   ? Methods Explanation;Demonstration;Handout   ? Comprehension Verbalized understanding;Returned demonstration   ? ?  ?  ? ?  ? ? ? PT Short Term Goals - 01/01/22 1627   ? ?  ? PT SHORT TERM GOAL #1  ? Title Will be compliant with appropriate progressive HEP   ? Time 3   ? Period Weeks   ? Status New   ? Target Date 01/22/22   ?  ? PT SHORT TERM GOAL #2  ? Title Cervical and thoracic ROM to have improved by 50%   ? Time 3   ? Period Weeks   ? Status New   ?  ? PT SHORT TERM GOAL #3  ? Title Will have better awareness of posture and postural mechanics during functional tasks   ? Time 3   ? Period Weeks   ? Status New   ? ?  ?  ? ?  ? ? ? ? PT Long Term Goals - 01/01/22 1629   ? ?  ? PT LONG TERM GOAL #1  ? Title MMT to have improved by one grade in all weak groups   ? Time 6   ? Period Weeks   ? Status New   ? Target Date 02/12/22   ?  ? PT LONG TERM GOAL #2  ? Title Pain to be no more than 2/10 in order to improve QOL and functional task tolerance   ? Time 6   ? Period Weeks   ? Status New   ?  ? PT LONG TERM GOAL #3  ? Title  Will be able to perform all functional dressing and homecare tasks without increase in pain   ? Time 6   ? Period Weeks   ? Status New   ? ?  ?  ? ?  ? ? ? ? ? ? ? ? ? Plan - 01/01/22 1623   ? ? Clinical

## 2022-01-05 ENCOUNTER — Other Ambulatory Visit: Payer: Self-pay | Admitting: Allergy

## 2022-01-05 ENCOUNTER — Other Ambulatory Visit: Payer: Self-pay | Admitting: Family Medicine

## 2022-01-05 ENCOUNTER — Ambulatory Visit (INDEPENDENT_AMBULATORY_CARE_PROVIDER_SITE_OTHER): Payer: Medicare HMO

## 2022-01-05 DIAGNOSIS — J3089 Other allergic rhinitis: Secondary | ICD-10-CM

## 2022-01-05 DIAGNOSIS — J309 Allergic rhinitis, unspecified: Secondary | ICD-10-CM

## 2022-01-09 DIAGNOSIS — J3089 Other allergic rhinitis: Secondary | ICD-10-CM | POA: Diagnosis not present

## 2022-01-09 NOTE — Progress Notes (Signed)
VIAL EXP 01-10-23 ?

## 2022-01-11 ENCOUNTER — Ambulatory Visit: Payer: Medicare HMO | Admitting: Physical Therapy

## 2022-01-11 ENCOUNTER — Encounter: Payer: Self-pay | Admitting: Physical Therapy

## 2022-01-11 DIAGNOSIS — R293 Abnormal posture: Secondary | ICD-10-CM

## 2022-01-11 DIAGNOSIS — R252 Cramp and spasm: Secondary | ICD-10-CM | POA: Diagnosis not present

## 2022-01-11 DIAGNOSIS — M6281 Muscle weakness (generalized): Secondary | ICD-10-CM | POA: Diagnosis not present

## 2022-01-11 DIAGNOSIS — M542 Cervicalgia: Secondary | ICD-10-CM | POA: Diagnosis not present

## 2022-01-11 NOTE — Therapy (Signed)
[facility] ?[place] ?[address] ?[place], [place], [address] ?Phone: [phone]   Fax:  [phone] ? ?Physical Therapy Treatment ? ?Patient Details  ?Name: Anne Barnes ?MRN: [id number] ?Date of Birth: [birth year] ?Referring Provider (PT): Penni Homans ? ? ?Encounter Date: 01/11/2022 ? ? PT End of Session - 01/11/22 1226   ? ? Visit Number 2   ? Date for PT Re-Evaluation 02/12/22   ? Authorization Type [employer]   ? Authorization Time Period 01/01/22 to 02/12/22   ? PT Start Time 1145   ? PT Stop Time 1228   ? PT Time Calculation (min) 43 min   ? Activity Tolerance Patient tolerated treatment well   ? Behavior During Therapy [facility] for tasks assessed/performed   ? ?  ?  ? ?  ? ? ?Past Medical History:  ?Diagnosis Date  ? Acute combined systolic and diastolic heart failure ([place]) 05/26/2015  ? ALLERGIC RHINITIS   ? Allergic urticaria 08/01/2014  ? Patient reports allergies to shrimp, egg whites, tomatoes, dairy   ? Allergy   ? seasonal and numerous food and drug allergies.  ? 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  ? Office Spirometry 11/07/17-WNL-FVC 1.58/82%, FEV1 1.27/85%, ratio 0.80, FEF 25-75% 0.26/89%  ? Bradycardia 02/25/2017  ? Chiari malformation   ? Noted MRI brain 09/2013 - s/p neuro eval for same  ? DIVERTICULOSIS, COLON   ? Essential hypertension 08/28/2010  ? Qualifier: Diagnosis of  By: Reatha Armour, Lucy     ? Frequent headaches   ? GERD   ? H/O measles   ? H/O mumps   ? Hearing loss 01/18/2017  ? History of chicken pox   ? Hyperglycemia 03/05/2016  ? HYPERTENSION   ? Hypocalcemia 02/25/2017  ? IBS (irritable bowel syndrome) 05/26/2015  ? Nonspecific abnormal electrocardiogram (ECG) (EKG)   ? OBSTRUCTIVE SLEEP APNEA 12/2008 dx  ? noncompliant with CPAP qhs  ? Obstructive sleep apnea 08/28/2010  ? NPSG 12/2008:  AHI 13/hr with desats to 78%   ? Preventative health care 06/11/2017  ? Sciatica   ? right side  ? Seasonal and perennial allergic rhinitis 08/28/2010  ? Allergy profile 03/11/14-  positive especially for dust, cat and dog Food allergy profile- total IgE 86 with several food group elevations Sed rate-WNL     ? Sinusitis 06/06/2017  ? Sleep apnea   ? uses cpap  ? 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  ? Vaginitis 01/18/2017  ? Vitamin D deficiency 06/11/2017  ? ? ?Past Surgical History:  ?Procedure Laterality Date  ? ABDOMINAL HYSTERECTOMY  1986  ? BREAST SURGERY  1973  ? Breast biopsy  ? COLONOSCOPY  2011  ? Dr Collene Mares  ? UMBILICAL HERNIA REPAIR    ? ? ?There were no vitals filed for this visit. ? ? Subjective Assessment - 01/11/22 1148   ? ? Subjective Having a few health issue that are flaring up all at once. No one of her good days.   ? Currently in Pain? No/denies   ? ?  ?  ? ?  ? ? ? ? ? ? ? ? ? ? ? ? ? ? ? ? ? ? ? ? [place] Adult PT Treatment/Exercise - 01/11/22 0001   ? ?  ? Neck Exercises: Machines for Strengthening  ? UBE (Upper Arm Bike) L2 x3 min forward/3 min backward   ?  ? Neck Exercises: Seated  ? Neck Retraction 10 reps;3 secs;Other (comment)   x2  ? Other  Seated Exercise rows red 2x10, ER yellow 2x10   ? Other Seated Exercise ext red 2x10, Horiz abd yellow 2x10   ?  ? Manual Therapy  ? Manual Therapy Passive ROM;Soft tissue mobilization   ? Soft tissue mobilization posterior cervical para spinales   ? Passive ROM Cervical spine   ? ?  ?  ? ?  ? ? ? ? ? ? ? ? ? ? ? ? PT Short Term Goals - 01/01/22 1627   ? ?  ? PT SHORT TERM GOAL #1  ? Title Will be compliant with appropriate progressive HEP   ? Time 3   ? Period Weeks   ? Status New   ? Target Date 01/22/22   ?  ? PT SHORT TERM GOAL #2  ? Title Cervical and thoracic ROM to have improved by 50%   ? Time 3   ? Period Weeks   ? Status New   ?  ? PT SHORT TERM GOAL #3  ? Title Will have better awareness of posture and postural mechanics during functional tasks   ? Time 3   ? Period Weeks   ? Status New   ? ?  ?  ? ?  ? ? ? ? PT Long Term Goals - 01/01/22 1629   ? ?  ? PT LONG TERM GOAL #1  ? Title MMT to have improved by one grade in all weak  groups   ? Time 6   ? Period Weeks   ? Status New   ? Target Date 02/12/22   ?  ? PT LONG TERM GOAL #2  ? Title Pain to be no more than 2/10 in order to improve QOL and functional task tolerance   ? Time 6   ? Period Weeks   ? Status New   ?  ? PT LONG TERM GOAL #3  ? Title Will be able to perform all functional dressing and homecare tasks without increase in pain   ? Time 6   ? Period Weeks   ? Status New   ? ?  ?  ? ?  ? ? ? ? ? ? ? ? Plan - 01/11/22 1226   ? ? Clinical Impression Statement Pt enters feeling fine physically but reporting that's she was having flair up from other medical issues. Some initial UT tightness noted with palpation that did relax as session progressed. Pt has a forward head and rounded shoulder at rest requiring cues throughout session to correct posture. Tactile cues to elbows needed with external rotation to keep good arm positioning.   ? Examination-Activity Limitations Bathing;Bed Mobility;Reach Overhead;Carry;Dressing;Hygiene/Grooming;Lift   ? Examination-Participation Restrictions Cleaning;Community Activity;Shop;Volunteer;Laundry;[employer] Work   ? Rehab Potential Good   ? PT Frequency 2x / week   ? PT Duration 6 weeks   ? PT Treatment/Interventions Cryotherapy;Moist Heat;Traction;Electrical Stimulation;Iontophoresis '4mg'$ /ml Dexamethasone;Ultrasound;Therapeutic exercise;Neuromuscular re-education;Manual techniques;Therapeutic activities;ADLs/Self Care Home Management   ? PT Next Visit Plan posture training and strengthening, mobility/flexibility   ? ?  ?  ? ?  ? ? ?Patient will benefit from skilled therapeutic intervention in order to improve the following deficits and impairments:  Impaired sensation ? ?Visit Diagnosis: ?Cramp and spasm ? ?Muscle weakness (generalized) ? ?Abnormal posture ? ? ? ? ?Problem List ?Patient Active Problem List  ? Diagnosis Date Noted  ? Neck pain on left side 12/13/2021  ? Congestion of throat 12/13/2021  ? Osteoporosis 12/13/2021  ? COVID 04/20/2021  ?  Chest pain of uncertain etiology  06/08/2020  ? Allergies 12/22/2019  ? Dizziness 11/19/2019  ? Grief reaction 08/27/2019  ? Left shoulder pain 08/27/2019  ? Hyperlipidemia 07/14/2018  ? Asthma, mild intermittent 06/06/2018  ? Morbid obesity ([place]) 01/09/2018  ? Vitamin D deficiency 06/11/2017  ? Preventative health care 06/11/2017  ? Hypocalcemia 02/25/2017  ? Bradycardia 02/25/2017  ? Hearing loss 01/18/2017  ? Hyperglycemia 03/05/2016  ? 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  ? CHF (congestive heart failure) ([place]) 05/26/2015  ? Female bladder prolapse 05/26/2015  ? IBS (irritable bowel syndrome) 05/26/2015  ? History of chicken pox   ? Allergic urticaria 08/01/2014  ? Food allergy 08/01/2014  ? Tinnitus 03/14/2014  ? Osteoarthritis   ? Nonspecific abnormal electrocardiogram (ECG) (EKG)   ? Headache 10/21/2013  ? Chiari malformation   ? GERD 11/10/2010  ? Obstructive sleep apnea 08/28/2010  ? Essential hypertension 08/28/2010  ? Seasonal and perennial allergic rhinitis 08/28/2010  ? DIVERTICULOSIS, COLON 08/28/2010  ? ? ?Scot Jun, PTA ?01/11/2022, 12:30 PM ? ?[place] ?[place] ?[address] ?[place], [place], [address] ?Phone: [phone]   Fax:  [phone] ? ?Name: Anne Barnes ?MRN: [id number] ?Date of Birth: Mar 28, 1946 ? ? ? ?

## 2022-01-16 ENCOUNTER — Ambulatory Visit: Payer: Medicare HMO | Admitting: Physical Therapy

## 2022-01-16 ENCOUNTER — Encounter: Payer: Self-pay | Admitting: Physical Therapy

## 2022-01-16 DIAGNOSIS — R252 Cramp and spasm: Secondary | ICD-10-CM

## 2022-01-16 DIAGNOSIS — R293 Abnormal posture: Secondary | ICD-10-CM | POA: Diagnosis not present

## 2022-01-16 DIAGNOSIS — M542 Cervicalgia: Secondary | ICD-10-CM | POA: Diagnosis not present

## 2022-01-16 DIAGNOSIS — M6281 Muscle weakness (generalized): Secondary | ICD-10-CM | POA: Diagnosis not present

## 2022-01-16 NOTE — Therapy (Signed)
[facility] [place] ?[place] ?[address] ?[place], [place], [address] ?Phone: [phone]   Fax:  [phone] ? ?Physical Therapy Treatment ? ?Patient Details  ?Name: Anne Barnes ?MRN: [id number] ?Date of Birth: [birth year] ?Referring Provider (PT): Anne Barnes ? ? ?Encounter Date: 01/16/2022 ? ? PT End of Session - 01/16/22 1535   ? ? Visit Number 3   ? Number of Visits 13   ? Date for PT Re-Evaluation 02/12/22   ? Authorization Type [employer]   ? Authorization Time Period 01/01/22 to 02/12/22   ? PT Start Time 6389   ? PT Stop Time 1529   ? PT Time Calculation (min) 40 min   ? Activity Tolerance Patient tolerated treatment well   ? Behavior During Therapy [facility] for tasks assessed/performed   ? ?  ?  ? ?  ? ? ?Past Medical History:  ?Diagnosis Date  ? Acute combined systolic and diastolic heart failure ([place]) 05/26/2015  ? ALLERGIC RHINITIS   ? Allergic urticaria 08/01/2014  ? Patient reports allergies to shrimp, egg whites, tomatoes, dairy   ? Allergy   ? seasonal and numerous food and drug allergies.  ? 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  ? Office Spirometry 11/07/17-WNL-FVC 1.58/82%, FEV1 1.27/85%, ratio 0.80, FEF 25-75% 0.26/89%  ? Bradycardia 02/25/2017  ? Chiari malformation   ? Noted MRI brain 09/2013 - s/p neuro eval for same  ? DIVERTICULOSIS, COLON   ? Essential hypertension 08/28/2010  ? Qualifier: Diagnosis of  By: Anne Barnes, Anne Barnes     ? Frequent headaches   ? GERD   ? H/O measles   ? H/O mumps   ? Hearing loss 01/18/2017  ? History of chicken pox   ? Hyperglycemia 03/05/2016  ? HYPERTENSION   ? Hypocalcemia 02/25/2017  ? IBS (irritable bowel syndrome) 05/26/2015  ? Nonspecific abnormal electrocardiogram (ECG) (EKG)   ? OBSTRUCTIVE SLEEP APNEA 12/2008 dx  ? noncompliant with CPAP qhs  ? Obstructive sleep apnea 08/28/2010  ? NPSG 12/2008:  AHI 13/hr with desats to 78%   ? Preventative health care 06/11/2017  ? Sciatica   ? right side  ? Seasonal and perennial allergic rhinitis 08/28/2010  ?  Allergy profile 03/11/14- positive especially for dust, cat and dog Food allergy profile- total IgE 86 with several food group elevations Sed rate-WNL     ? Sinusitis 06/06/2017  ? Sleep apnea   ? uses cpap  ? 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  ? Vaginitis 01/18/2017  ? Vitamin D deficiency 06/11/2017  ? ? ?Past Surgical History:  ?Procedure Laterality Date  ? ABDOMINAL HYSTERECTOMY  1986  ? BREAST SURGERY  1973  ? Breast biopsy  ? COLONOSCOPY  2011  ? Dr Anne Barnes  ? UMBILICAL HERNIA REPAIR    ? ? ?There were no vitals filed for this visit. ? ? Subjective Assessment - 01/16/22 1452   ? ? Subjective I've been working on my exercises, I don't know if I'm any better at them. I'm still having a lot of cramping in my neck   ? Currently in Pain? No/denies   ? ?  ?  ? ?  ? ? ? ? ? ? ? ? ? ? ? ? ? ? ? ? ? ? ? ? [place] Adult PT Treatment/Exercise - 01/16/22 0001   ? ?  ? Neck Exercises: Machines for Strengthening  ? UBE (Upper Arm Bike) L2 x3 min forward/3 min backward   ? Other Machines for Strengthening shoulder  extensions 5# 1x10 then 10# 1x10; cable rows 10# 1x10; shoulder ER3# 1x10 B   ?  ? Neck Exercises: Seated  ? Other Seated Exercise thoracic excursions x15 all directions   ?  ? Manual Therapy  ? Manual Therapy Soft tissue mobilization   ? Soft tissue mobilization B UT and cervical paraspinals   ? ?  ?  ? ?  ? ? ? ? ? ? ? ? ? ? PT Education - 01/16/22 1535   ? ? Education Details exercise form/purpose   ? Person(s) Educated Patient   ? Methods Explanation   ? Comprehension Verbalized understanding   ? ?  ?  ? ?  ? ? ? PT Short Term Goals - 01/01/22 1627   ? ?  ? PT SHORT TERM GOAL #1  ? Title Will be compliant with appropriate progressive HEP   ? Time 3   ? Period Weeks   ? Status New   ? Target Date 01/22/22   ?  ? PT SHORT TERM GOAL #2  ? Title Cervical and thoracic ROM to have improved by 50%   ? Time 3   ? Period Weeks   ? Status New   ?  ? PT SHORT TERM GOAL #3  ? Title Will have better awareness of posture and postural  mechanics during functional tasks   ? Time 3   ? Period Weeks   ? Status New   ? ?  ?  ? ?  ? ? ? ? PT Long Term Goals - 01/01/22 1629   ? ?  ? PT LONG TERM GOAL #1  ? Title MMT to have improved by one grade in all weak groups   ? Time 6   ? Period Weeks   ? Status New   ? Target Date 02/12/22   ?  ? PT LONG TERM GOAL #2  ? Title Pain to be no more than 2/10 in order to improve QOL and functional task tolerance   ? Time 6   ? Period Weeks   ? Status New   ?  ? PT LONG TERM GOAL #3  ? Title Will be able to perform all functional dressing and homecare tasks without increase in pain   ? Time 6   ? Period Weeks   ? Status New   ? ?  ?  ? ?  ? ? ? ? ? ? ? ? Plan - 01/16/22 1535   ? ? Clinical Impression Statement Anne Barnes arrives today doing OK, still having a lot of cramps and spasms in her neck. We warmed up on the arm bike then kept working on thoracic and cervical mobility as well as postural strengthening. Still very stiff. Needed cues to stay on track with exercises today, extremely talkative. Will continue to progress as able and tolerated.   ? Personal Factors and Comorbidities Time since onset of injury/illness/exacerbation   ? Examination-Activity Limitations Bathing;Bed Mobility;Reach Overhead;Carry;Dressing;Hygiene/Grooming;Lift   ? Examination-Participation Restrictions Cleaning;Community Activity;Shop;Volunteer;Laundry;[employer] Work   ? Stability/Clinical Decision Making Stable/Uncomplicated   ? Clinical Decision Making Low   ? Rehab Potential Good   ? PT Frequency 2x / week   ? PT Duration 6 weeks   ? PT Treatment/Interventions Cryotherapy;Moist Heat;Traction;Electrical Stimulation;Iontophoresis '4mg'$ /ml Dexamethasone;Ultrasound;Therapeutic exercise;Neuromuscular re-education;Manual techniques;Therapeutic activities;ADLs/Self Care Home Management   ? PT Next Visit Plan posture training and strengthening, mobility/flexibility   ? PT Home Exercise Plan [id number]   ? ?  ?  ? ?  ? ? ?  Patient will benefit from  skilled therapeutic intervention in order to improve the following deficits and impairments:  Impaired sensation ? ?Visit Diagnosis: ?Cramp and spasm ? ?Abnormal posture ? ?Muscle weakness (generalized) ? ? ? ? ?Problem List ?Patient Active Problem List  ? Diagnosis Date Noted  ? Neck pain on left side 12/13/2021  ? Congestion of throat 12/13/2021  ? Osteoporosis 12/13/2021  ? COVID 04/20/2021  ? Chest pain of uncertain etiology 67/20/9470  ? Allergies 12/22/2019  ? Dizziness 11/19/2019  ? Grief reaction 08/27/2019  ? Left shoulder pain 08/27/2019  ? Hyperlipidemia 07/14/2018  ? Asthma, mild intermittent 06/06/2018  ? Morbid obesity ([place]) 01/09/2018  ? Vitamin D deficiency 06/11/2017  ? Preventative health care 06/11/2017  ? Hypocalcemia 02/25/2017  ? Bradycardia 02/25/2017  ? Hearing loss 01/18/2017  ? Hyperglycemia 03/05/2016  ? 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  ? CHF (congestive heart failure) ([place]) 05/26/2015  ? Female bladder prolapse 05/26/2015  ? IBS (irritable bowel syndrome) 05/26/2015  ? History of chicken pox   ? Allergic urticaria 08/01/2014  ? Food allergy 08/01/2014  ? Tinnitus 03/14/2014  ? Osteoarthritis   ? Nonspecific abnormal electrocardiogram (ECG) (EKG)   ? Headache 10/21/2013  ? Chiari malformation   ? GERD 11/10/2010  ? Obstructive sleep apnea 08/28/2010  ? Essential hypertension 08/28/2010  ? Seasonal and perennial allergic rhinitis 08/28/2010  ? DIVERTICULOSIS, COLON 08/28/2010  ? ?Arelia Volpe U PT, DPT, PN2  ? ?Supplemental Physical Therapist ?[place]  ? ? ? ? ? ?[place] ?[address] ?[address] ?[place], [place], [address] ?Phone: [phone]   Fax:  [phone] ? ?Name: Anne Barnes ?MRN: [id number] ?Date of Birth: [birth year] ? ? ? ?

## 2022-01-18 ENCOUNTER — Encounter: Payer: Self-pay | Admitting: Physical Therapy

## 2022-01-18 ENCOUNTER — Ambulatory Visit: Payer: Medicare HMO | Admitting: Physical Therapy

## 2022-01-18 DIAGNOSIS — R252 Cramp and spasm: Secondary | ICD-10-CM | POA: Diagnosis not present

## 2022-01-18 DIAGNOSIS — R293 Abnormal posture: Secondary | ICD-10-CM

## 2022-01-18 DIAGNOSIS — M6281 Muscle weakness (generalized): Secondary | ICD-10-CM

## 2022-01-18 DIAGNOSIS — M542 Cervicalgia: Secondary | ICD-10-CM | POA: Diagnosis not present

## 2022-01-18 NOTE — Therapy (Signed)
[facility] ?[place] ?[address] ?[place], [place], [address] ?Phone: [phone]   Fax:  [phone] ? ?Physical Therapy Treatment ? ?Patient Details  ?Name: Anne Barnes ?MRN: [id number] ?Date of Birth: 08-06-1946 ?Referring Provider (Barnes): Penni Homans ? ? ?Encounter Date: 01/18/2022 ? ? Barnes End of Session - 01/18/22 1541   ? ? Visit Number 4   ? Number of Visits 13   ? Date for Barnes Re-Evaluation 02/12/22   ? Authorization Type [employer]   ? Authorization Time Period 01/01/22 to 02/12/22   ? Barnes Start Time 1448   ? Barnes Stop Time 6314   ? Barnes Time Calculation (min) 39 min   ? Activity Tolerance Patient tolerated treatment well   ? Behavior During Therapy [facility] for tasks assessed/performed   ? ?  ?  ? ?  ? ? ?Past Medical History:  ?Diagnosis Date  ? Acute combined systolic and diastolic heart failure ([place]) 05/26/2015  ? ALLERGIC RHINITIS   ? Allergic urticaria 08/01/2014  ? Patient reports allergies to shrimp, egg whites, tomatoes, dairy   ? Allergy   ? seasonal and numerous food and drug allergies.  ? 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  ? Office Spirometry 11/07/17-WNL-FVC 1.58/82%, FEV1 1.27/85%, ratio 0.80, FEF 25-75% 0.26/89%  ? Bradycardia 02/25/2017  ? Chiari malformation   ? Noted MRI brain 09/2013 - s/p neuro eval for same  ? DIVERTICULOSIS, COLON   ? Essential hypertension 08/28/2010  ? Qualifier: Diagnosis of  By: Reatha Armour, Lucy     ? Frequent headaches   ? GERD   ? H/O measles   ? H/O mumps   ? Hearing loss 01/18/2017  ? History of chicken pox   ? Hyperglycemia 03/05/2016  ? HYPERTENSION   ? Hypocalcemia 02/25/2017  ? IBS (irritable bowel syndrome) 05/26/2015  ? Nonspecific abnormal electrocardiogram (ECG) (EKG)   ? OBSTRUCTIVE SLEEP APNEA 12/2008 dx  ? noncompliant with CPAP qhs  ? Obstructive sleep apnea 08/28/2010  ? NPSG 12/2008:  AHI 13/hr with desats to 78%   ? Preventative health care 06/11/2017  ? Sciatica   ? right side  ? Seasonal and perennial allergic rhinitis 08/28/2010  ?  Allergy profile 03/11/14- positive especially for dust, cat and dog Food allergy profile- total IgE 86 with several food group elevations Sed rate-WNL     ? Sinusitis 06/06/2017  ? Sleep apnea   ? uses cpap  ? 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  ? Vaginitis 01/18/2017  ? Vitamin D deficiency 06/11/2017  ? ? ?Past Surgical History:  ?Procedure Laterality Date  ? ABDOMINAL HYSTERECTOMY  1986  ? BREAST SURGERY  1973  ? Breast biopsy  ? COLONOSCOPY  2011  ? Dr Collene Mares  ? UMBILICAL HERNIA REPAIR    ? ? ?There were no vitals filed for this visit. ? ? Subjective Assessment - 01/18/22 1449   ? ? Subjective I brought positive Anne Barnes today. My shoulders are a lot looser.   ? Currently in Pain? No/denies   ? ?  ?  ? ?  ? ? ? ? ? ? ? ? ? ? ? ? ? ? ? ? ? ? ? ? [facility] Adult Barnes Treatment/Exercise - 01/18/22 0001   ? ?  ? Neck Exercises: Machines for Strengthening  ? UBE (Upper Arm Bike) L2.5 x3 min forward/3 min backward   ? Other Machines for Strengthening shoulder extensions 10# 1x15; D1 and D2 1x10 B 3-5#   ? Other Machines  for Strengthening cable rows 10# 1x15   ?  ? Neck Exercises: Seated  ? Other Seated Exercise thoracic excursions x15 all directions   ?  ? Neck Exercises: Stretches  ? Corner Stretch 3 reps;30 seconds   ?  ? Manual Therapy  ? Manual Therapy Soft tissue mobilization   ? Soft tissue mobilization B UT and cervical paraspinals   ? ?  ?  ? ?  ? ? ? ? ? ? ? ? ? ? Barnes Education - 01/18/22 1541   ? ? Education Details exercise form/purpose   ? Person(s) Educated Patient   ? Methods Explanation   ? Comprehension Verbalized understanding   ? ?  ?  ? ?  ? ? ? Barnes Short Term Goals - 01/01/22 1627   ? ?  ? Barnes SHORT TERM GOAL #1  ? Title Will be compliant with appropriate progressive HEP   ? Time 3   ? Period Weeks   ? Status New   ? Target Date 01/22/22   ?  ? Barnes SHORT TERM GOAL #2  ? Title Cervical and thoracic ROM to have improved by 50%   ? Time 3   ? Period Weeks   ? Status New   ?  ? Barnes SHORT TERM GOAL #3  ? Title Will have  better awareness of posture and postural mechanics during functional tasks   ? Time 3   ? Period Weeks   ? Status New   ? ?  ?  ? ?  ? ? ? ? Barnes Long Term Goals - 01/01/22 1629   ? ?  ? Barnes LONG TERM GOAL #1  ? Title MMT to have improved by one grade in all weak groups   ? Time 6   ? Period Weeks   ? Status New   ? Target Date 02/12/22   ?  ? Barnes LONG TERM GOAL #2  ? Title Pain to be no more than 2/10 in order to improve QOL and functional task tolerance   ? Time 6   ? Period Weeks   ? Status New   ?  ? Barnes LONG TERM GOAL #3  ? Title Will be able to perform all functional dressing and homecare tasks without increase in pain   ? Time 6   ? Period Weeks   ? Status New   ? ?  ?  ? ?  ? ? ? ? ? ? ? ? Plan - 01/18/22 1542   ? ? Clinical Impression Statement Anne Barnes arrives today feeling well, tells me she has no pain and her muscles feel much looser. We continued working on functional postural training and strengthening. Continued to encourage her to be more positive about her progress and not so hard on herself since she is making a lot of steps in the right direction. Has a lot of weakness in postural muscles and shoulders still. Responding well to STM. Will continue efforts.   ? Personal Factors and Comorbidities Time since onset of injury/illness/exacerbation   ? Examination-Activity Limitations Bathing;Bed Mobility;Reach Overhead;Carry;Dressing;Hygiene/Grooming;Lift   ? Examination-Participation Restrictions Cleaning;Community Activity;Shop;Volunteer;Laundry;[employer] Work   ? Stability/Clinical Decision Making Stable/Uncomplicated   ? Clinical Decision Making Low   ? Rehab Potential Good   ? Barnes Frequency 2x / week   ? Barnes Duration 6 weeks   ? Barnes Treatment/Interventions Cryotherapy;Moist Heat;Traction;Electrical Stimulation;Iontophoresis '4mg'$ /ml Dexamethasone;Ultrasound;Therapeutic exercise;Neuromuscular re-education;Manual techniques;Therapeutic activities;ADLs/Self Care Home Management   ? Barnes Next Visit Plan posture training  and strengthening, mobility/flexibility   ? Barnes Home Exercise Plan [phone]   ? Consulted and Agree with Plan of Care Patient   ? ?  ?  ? ?  ? ? ?Patient will benefit from skilled therapeutic intervention in order to improve the following deficits and impairments:  Impaired sensation ? ?Visit Diagnosis: ?Cramp and spasm ? ?Abnormal posture ? ?Muscle weakness (generalized) ? ? ? ? ?Problem List ?Patient Active Problem List  ? Diagnosis Date Noted  ? Neck pain on left side 12/13/2021  ? Congestion of throat 12/13/2021  ? Osteoporosis 12/13/2021  ? COVID 04/20/2021  ? Chest pain of uncertain etiology 32/54/9826  ? Allergies 12/22/2019  ? Dizziness 11/19/2019  ? Grief reaction 08/27/2019  ? Left shoulder pain 08/27/2019  ? Hyperlipidemia 07/14/2018  ? Asthma, mild intermittent 06/06/2018  ? Morbid obesity ([place]) 01/09/2018  ? Vitamin D deficiency 06/11/2017  ? Preventative health care 06/11/2017  ? Hypocalcemia 02/25/2017  ? Bradycardia 02/25/2017  ? Hearing loss 01/18/2017  ? Hyperglycemia 03/05/2016  ? 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  ? CHF (congestive heart failure) ([place]) 05/26/2015  ? Female bladder prolapse 05/26/2015  ? IBS (irritable bowel syndrome) 05/26/2015  ? History of chicken pox   ? Allergic urticaria 08/01/2014  ? Food allergy 08/01/2014  ? Tinnitus 03/14/2014  ? Osteoarthritis   ? Nonspecific abnormal electrocardiogram (ECG) (EKG)   ? Headache 10/21/2013  ? Chiari malformation   ? GERD 11/10/2010  ? Obstructive sleep apnea 08/28/2010  ? Essential hypertension 08/28/2010  ? Seasonal and perennial allergic rhinitis 08/28/2010  ? DIVERTICULOSIS, COLON 08/28/2010  ? ?Anne Barnes Anne Barnes, Anne Barnes, Anne Barnes  ? ?Supplemental Physical Therapist ?[facility]  ? ? ? ? ? ?[facility] ?[address] ?[address] ?[place], [place], [address] ?Phone: [phone]   Fax:  [phone] ? ?Name: Anne Barnes ?MRN: [id number] ?Date of Birth: [birth year] ? ? ? ?

## 2022-01-19 ENCOUNTER — Ambulatory Visit (INDEPENDENT_AMBULATORY_CARE_PROVIDER_SITE_OTHER): Payer: Medicare HMO

## 2022-01-19 DIAGNOSIS — J309 Allergic rhinitis, unspecified: Secondary | ICD-10-CM

## 2022-01-20 NOTE — Progress Notes (Deleted)
?Cardiology Office Note:   ? ?Date:  01/20/2022  ? ?ID:  Anne Barnes, DOB [birth year], MRN [id number] ? ?PCP:  Mosie Lukes, MD ?  ?[place]  ?Cardiologist:  Freada Bergeron, MD  ?Advanced Practice Provider:  Liliane Shi, PA-C ?Electrophysiologist:  None  ? ?Referring MD: Mosie Lukes, MD  ? ? ?History of Present Illness:   ? ?Anne Barnes is a 76 y.o. 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 Meda Coffee who now returns to clinic for follow-up. ? ?She was seen in January 2021 for concern of chest pain and a coronary CTA was performed that showed mild nonobstructive CAD and also findings of peribronchovascular nodules in the medial aspect of the right lower lobe with ill-defined margins, likely infectious or inflammatory in etiology.  Repeat CT scan on Jan 26, 2020 showed that those resolved and it confirmed inflammatory etiology.  She was started on Crestor 10 mg daily that she tolerates well. ? ?Saw Dr. Meda Coffee on 08/04/20 where she was mourning the loss of her husband of 32 years. She had mild LE edema but no orthopnea. Her spironolactone was increased to '25mg'$  daily. ? ?Seen in clinic on 04/25/21 by Richardson Dopp where she was doing well. Continuing to have issues with allergies. ? ?Was last seen in clinic on 10/2021 where she was having non exertional chest pain that was worsened by laying down or eating meals. Symptoms thought to be GERD in nature.  ? ?Today, *** ? ? ?Past Medical History:  ?Diagnosis Date  ? Acute combined systolic and diastolic heart failure ([place]) 05/26/2015  ? ALLERGIC RHINITIS   ? Allergic urticaria 08/01/2014  ? Patient reports allergies to shrimp, egg whites, tomatoes, dairy   ? Allergy   ? seasonal and numerous food and drug allergies.  ? 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  ? Office Spirometry 11/07/17-WNL-FVC 1.58/82%, FEV1 1.27/85%, ratio 0.80,  FEF 25-75% 0.26/89%  ? Bradycardia 02/25/2017  ? Chiari malformation   ? Noted MRI brain 09/2013 - s/p neuro eval for same  ? DIVERTICULOSIS, COLON   ? Essential hypertension 08/28/2010  ? Qualifier: Diagnosis of  By: Reatha Armour, Lucy     ? Frequent headaches   ? GERD   ? H/O measles   ? H/O mumps   ? Hearing loss 01/18/2017  ? History of chicken pox   ? Hyperglycemia 03/05/2016  ? HYPERTENSION   ? Hypocalcemia 02/25/2017  ? IBS (irritable bowel syndrome) 05/26/2015  ? Nonspecific abnormal electrocardiogram (ECG) (EKG)   ? OBSTRUCTIVE SLEEP APNEA 12/2008 dx  ? noncompliant with CPAP qhs  ? Obstructive sleep apnea 08/28/2010  ? NPSG 12/2008:  AHI 13/hr with desats to 78%   ? Preventative health care 06/11/2017  ? Sciatica   ? right side  ? Seasonal and perennial allergic rhinitis 08/28/2010  ? Allergy profile 03/11/14- positive especially for dust, cat and dog Food allergy profile- total IgE 86 with several food group elevations Sed rate-WNL     ? Sinusitis 06/06/2017  ? Sleep apnea   ? uses cpap  ? 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  ? Vaginitis 01/18/2017  ? Vitamin D deficiency 06/11/2017  ? ? ?Past Surgical History:  ?Procedure Laterality Date  ? ABDOMINAL HYSTERECTOMY  1986  ? BREAST SURGERY  1973  ? Breast biopsy  ? COLONOSCOPY  2011  ? Dr Collene Mares  ?  UMBILICAL HERNIA REPAIR    ? ? ?Current Medications: ?No outpatient medications have been marked as taking for the 01/24/22 encounter (Appointment) with Freada Bergeron, MD.  ?  ? ?Allergies:   Codeine, Eggs or egg-derived products, Hydralazine hcl, Lisinopril-hydrochlorothiazide, Metoprolol, Other, Shrimp [shellfish allergy], and Tomato  ? ?Social History  ? ?Socioeconomic History  ? Marital status: Widowed  ?  Spouse name: Not on file  ? Number of children: Not on file  ? Years of education: Not on file  ? Highest education level: Not on file  ?Occupational History  ? Occupation: retired  ?Tobacco Use  ? Smoking status: Never  ? Smokeless tobacco: Never  ? Tobacco comments:  ?   Married, lives with spouse, dtr 2 g-kids.  ?Vaping Use  ? Vaping Use: Never used  ?Substance and Sexual Activity  ? Alcohol use: No  ?  Comment: rare  ? Drug use: No  ? Sexual activity: Not Currently  ?  Comment: lives with husband, no dietary restrictions avoid foods allergic to, retired  ?Other Topics Concern  ? Not on file  ?Social History Narrative  ? Right handed  ? One story with daughter and two grandkids  ? ?Social Determinants of Health  ? ?Financial Resource Strain: Low Risk   ? Difficulty of Paying Living Expenses: Not hard at all  ?Food Insecurity: No Food Insecurity  ? Worried About Charity fundraiser in the Last Year: Never true  ? Ran Out of Food in the Last Year: Never true  ?Transportation Needs: No Transportation Needs  ? Lack of Transportation (Medical): No  ? Lack of Transportation (Non-Medical): No  ?Physical Activity: Sufficiently Active  ? Days of Exercise per Week: 7 days  ? Minutes of Exercise per Session: 40 min  ?Stress: No Stress Concern Present  ? Feeling of Stress : Not at all  ?Social Connections: Moderately Isolated  ? Frequency of Communication with Friends and Family: More than three times a week  ? Frequency of Social Gatherings with Friends and Family: More than three times a week  ? Attends Religious Services: More than 4 times per year  ? Active Member of Clubs or Organizations: No  ? Attends Archivist Meetings: Never  ? Marital Status: Widowed  ?  ? ?Family History: ?The patient's family history includes Alcohol abuse in her brother and another family member; Arthritis in her father and mother; Diabetes in her brother; Fibromyalgia in her daughter; Berenice Primas' disease in her daughter; Heart attack in her brother; Heart disease in her brother and father; Hypertension in her daughter, daughter, maternal grandfather, and sister; Kidney disease in her father, sister, and sister; Lung cancer in her brother; Lupus in her daughter; Obesity in her sister; Pneumonia in her  brother and father; Rheum arthritis in her daughter; Sjogren's syndrome in her daughter; Throat cancer in her brother. There is no history of Ataxia, Chorea, Dementia, Mental retardation, Migraines, Multiple sclerosis, Neurofibromatosis, Neuropathy, Parkinsonism, Seizures, Stroke, Colon cancer, Colon polyps, Esophageal cancer, Rectal cancer, or Stomach cancer. ? ?ROS:   ?Please see the history of present illness.    ?Review of Systems  ?Constitutional:  Positive for malaise/fatigue. Negative for chills and fever.  ?HENT:  Negative for sore throat.   ?Eyes:  Negative for blurred vision and redness.  ?Respiratory:  Positive for shortness of breath.   ?Cardiovascular:  Positive for chest pain and leg swelling. Negative for palpitations, orthopnea, claudication and PND.  ?Gastrointestinal:  Negative for melena, nausea  and vomiting.  ?Genitourinary:  Negative for dysuria and flank pain.  ?Musculoskeletal:  Positive for joint pain.  ?Neurological:  Positive for dizziness, tremors and weakness. Negative for loss of consciousness.  ?Endo/Heme/Allergies:  Positive for environmental allergies. Negative for polydipsia.  ?Psychiatric/Behavioral:  Negative for substance abuse.   ? ?EKGs/Labs/Other Studies Reviewed:   ? ?The following studies were reviewed today: ? ?Echo 03/31/2021: ? 1. Left ventricular ejection fraction, by estimation, is 60 to 65%. The  ?left ventricle has normal function. The left ventricle has no regional  ?wall motion abnormalities. Left ventricular diastolic parameters were  ?normal.  ? 2. Right ventricular systolic function is normal. The right ventricular  ?size is normal.  ? 3. The mitral valve is normal in structure. Trivial mitral valve  ?regurgitation. No evidence of mitral stenosis.  ? 4. The aortic valve is tricuspid. Aortic valve regurgitation is not  ?visualized. No aortic stenosis is present.  ? 5. The inferior vena cava is normal in size with greater than 50%  ?respiratory variability, suggesting  right atrial pressure of 3 mmHg. ? ?Cardiac monitor 12/2019: ?Sinus bradycardia to sinus tachycardia. Patient had a min HR of 39 bpm, max HR of 167 bpm, and avg HR of 57 bpm. ?Very short episodes of SVT, no pauses

## 2022-01-23 ENCOUNTER — Ambulatory Visit: Payer: Medicare HMO | Attending: Family Medicine | Admitting: Physical Therapy

## 2022-01-23 ENCOUNTER — Encounter: Payer: Self-pay | Admitting: Physical Therapy

## 2022-01-23 DIAGNOSIS — R293 Abnormal posture: Secondary | ICD-10-CM | POA: Diagnosis not present

## 2022-01-23 DIAGNOSIS — R252 Cramp and spasm: Secondary | ICD-10-CM | POA: Insufficient documentation

## 2022-01-23 DIAGNOSIS — M6281 Muscle weakness (generalized): Secondary | ICD-10-CM | POA: Diagnosis not present

## 2022-01-23 NOTE — Therapy (Signed)
[facility] ?[place] ?[address] ?[place], [place], [address] ?Phone: [phone]   Fax:  [phone] ? ?Physical Therapy Treatment ? ?Patient Details  ?Name: Anne Barnes ?MRN: [id number] ?Date of Birth: [birth year] ?Referring Provider (PT): Penni Homans ? ? ?Encounter Date: 01/23/2022 ? ? PT End of Session - 01/23/22 1509   ? ? Visit Number 5   ? Date for PT Re-Evaluation 02/12/22   ? Authorization Time Period 01/01/22 to 02/12/22   ? PT Start Time 1440   ? PT Stop Time 1515   ? PT Time Calculation (min) 35 min   ? Activity Tolerance Patient tolerated treatment well   ? Behavior During Therapy [facility] for tasks assessed/performed   ? ?  ?  ? ?  ? ? ?Past Medical History:  ?Diagnosis Date  ? Acute combined systolic and diastolic heart failure ([place]) 05/26/2015  ? ALLERGIC RHINITIS   ? Allergic urticaria 08/01/2014  ? Patient reports allergies to shrimp, egg whites, tomatoes, dairy   ? Allergy   ? seasonal and numerous food and drug allergies.  ? 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  ? Office Spirometry 11/07/17-WNL-FVC 1.58/82%, FEV1 1.27/85%, ratio 0.80, FEF 25-75% 0.26/89%  ? Bradycardia 02/25/2017  ? Chiari malformation   ? Noted MRI brain 09/2013 - s/p neuro eval for same  ? DIVERTICULOSIS, COLON   ? Essential hypertension 08/28/2010  ? Qualifier: Diagnosis of  By: Reatha Armour, Lucy     ? Frequent headaches   ? GERD   ? H/O measles   ? H/O mumps   ? Hearing loss 01/18/2017  ? History of chicken pox   ? Hyperglycemia 03/05/2016  ? HYPERTENSION   ? Hypocalcemia 02/25/2017  ? IBS (irritable bowel syndrome) 05/26/2015  ? Nonspecific abnormal electrocardiogram (ECG) (EKG)   ? OBSTRUCTIVE SLEEP APNEA 12/2008 dx  ? noncompliant with CPAP qhs  ? Obstructive sleep apnea 08/28/2010  ? NPSG 12/2008:  AHI 13/hr with desats to 78%   ? Preventative health care 06/11/2017  ? Sciatica   ? right side  ? Seasonal and perennial allergic rhinitis 08/28/2010  ? Allergy profile 03/11/14- positive especially for dust,  cat and dog Food allergy profile- total IgE 86 with several food group elevations Sed rate-WNL     ? Sinusitis 06/06/2017  ? Sleep apnea   ? uses cpap  ? 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  ? Vaginitis 01/18/2017  ? Vitamin D deficiency 06/11/2017  ? ? ?Past Surgical History:  ?Procedure Laterality Date  ? ABDOMINAL HYSTERECTOMY  1986  ? BREAST SURGERY  1973  ? Breast biopsy  ? COLONOSCOPY  2011  ? Dr Collene Mares  ? UMBILICAL HERNIA REPAIR    ? ? ?There were no vitals filed for this visit. ? ? Subjective Assessment - 01/23/22 1441   ? ? Subjective Other than pain Im ok. Pt reports spasms in her waist that's makes it hard to walk.   ? Currently in Pain? Yes   ? Pain Score 8    ? Pain Location --   waist flank  ? ?  ?  ? ?  ? ? ? ? ? ? ? ? ? ? ? ? ? ? ? ? ? ? ? ? [facility] Adult PT Treatment/Exercise - 01/23/22 0001   ? ?  ? Neck Exercises: Standing  ? Other Standing Exercises Rows & Ext green 2x15   ? Other Standing Exercises Shoulder flex 2lb & abd 1lb 2x10   ?  ?  Neck Exercises: Seated  ? Other Seated Exercise Sit to stand red ball OHP 2x10   ? Other Seated Exercise ER red 2x10   ? ?  ?  ? ?  ? ? ? ? ? ? ? ? ? ? ? ? PT Short Term Goals - 01/01/22 1627   ? ?  ? PT SHORT TERM GOAL #1  ? Title Will be compliant with appropriate progressive HEP   ? Time 3   ? Period Weeks   ? Status New   ? Target Date 01/22/22   ?  ? PT SHORT TERM GOAL #2  ? Title Cervical and thoracic ROM to have improved by 50%   ? Time 3   ? Period Weeks   ? Status New   ?  ? PT SHORT TERM GOAL #3  ? Title Will have better awareness of posture and postural mechanics during functional tasks   ? Time 3   ? Period Weeks   ? Status New   ? ?  ?  ? ?  ? ? ? ? PT Long Term Goals - 01/23/22 1510   ? ?  ? PT LONG TERM GOAL #1  ? Title MMT to have improved by one grade in all weak groups   ? Status On-going   ?  ? PT LONG TERM GOAL #2  ? Title Pain to be no more than 2/10 in order to improve QOL and functional task tolerance   ? Status On-going   ?  ? PT LONG TERM GOAL #3  ?  Title Will be able to perform all functional dressing and homecare tasks without increase in pain   ? Status On-going   ? ?  ?  ? ?  ? ? ? ? ? ? ? ? Plan - 01/23/22 1512   ? ? Clinical Impression Statement Pt ~ 10 minutes late for today's session. She reports spasms in her waist area. Pt enters ambulating with rollator because of her spasms and being fearful of falling. Symptoms decreased as she started moving. Postural cue required with seated shoulder external rotations. Some increase fatigue reported with sit to stands.   ? Personal Factors and Comorbidities Time since onset of injury/illness/exacerbation   ? Examination-Activity Limitations Bathing;Bed Mobility;Reach Overhead;Carry;Dressing;Hygiene/Grooming;Lift   ? Examination-Participation Restrictions Cleaning;Community Activity;Shop;Volunteer;Laundry;[employer] Work   ? Stability/Clinical Decision Making Stable/Uncomplicated   ? Rehab Potential Good   ? PT Frequency 2x / week   ? PT Duration 6 weeks   ? PT Treatment/Interventions Cryotherapy;Moist Heat;Traction;Electrical Stimulation;Iontophoresis '4mg'$ /ml Dexamethasone;Ultrasound;Therapeutic exercise;Neuromuscular re-education;Manual techniques;Therapeutic activities;ADLs/Self Care Home Management   ? PT Next Visit Plan posture training and strengthening, mobility/flexibility   ? ?  ?  ? ?  ? ? ?Patient will benefit from skilled therapeutic intervention in order to improve the following deficits and impairments:  Impaired sensation ? ?Visit Diagnosis: ?Cramp and spasm ? ?Abnormal posture ? ?Muscle weakness (generalized) ? ? ? ? ?Problem List ?Patient Active Problem List  ? Diagnosis Date Noted  ? Neck pain on left side 12/13/2021  ? Congestion of throat 12/13/2021  ? Osteoporosis 12/13/2021  ? COVID 04/20/2021  ? Chest pain of uncertain etiology 73/22/0254  ? Allergies 12/22/2019  ? Dizziness 11/19/2019  ? Grief reaction 08/27/2019  ? Left shoulder pain 08/27/2019  ? Hyperlipidemia 07/14/2018  ? Asthma, mild  intermittent 06/06/2018  ? Morbid obesity ([place]) 01/09/2018  ? Vitamin D deficiency 06/11/2017  ? Preventative health care 06/11/2017  ? Hypocalcemia 02/25/2017  ? Bradycardia  02/25/2017  ? Hearing loss 01/18/2017  ? Hyperglycemia 03/05/2016  ? 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  ? CHF (congestive heart failure) ([place]) 05/26/2015  ? Female bladder prolapse 05/26/2015  ? IBS (irritable bowel syndrome) 05/26/2015  ? History of chicken pox   ? Allergic urticaria 08/01/2014  ? Food allergy 08/01/2014  ? Tinnitus 03/14/2014  ? Osteoarthritis   ? Nonspecific abnormal electrocardiogram (ECG) (EKG)   ? Headache 10/21/2013  ? Chiari malformation   ? GERD 11/10/2010  ? Obstructive sleep apnea 08/28/2010  ? Essential hypertension 08/28/2010  ? Seasonal and perennial allergic rhinitis 08/28/2010  ? DIVERTICULOSIS, COLON 08/28/2010  ? ? ?Scot Jun, PTA ?01/23/2022, 3:15 PM ? ?[place] ?[address] ?[address] ?[place], [place], [address] ?Phone: [phone]   Fax:  [phone] ? ?Name: KAISLEE CHAO ?MRN: [id number] ?Date of Birth: 02-05-1946 ? ? ? ?

## 2022-01-24 ENCOUNTER — Encounter: Payer: Self-pay | Admitting: Cardiology

## 2022-01-24 ENCOUNTER — Ambulatory Visit: Payer: Medicare HMO | Admitting: Cardiology

## 2022-01-24 VITALS — BP 136/80 | HR 77 | Ht 59.0 in | Wt 231.2 lb

## 2022-01-24 DIAGNOSIS — I10 Essential (primary) hypertension: Secondary | ICD-10-CM | POA: Diagnosis not present

## 2022-01-24 DIAGNOSIS — I2510 Atherosclerotic heart disease of native coronary artery without angina pectoris: Secondary | ICD-10-CM | POA: Diagnosis not present

## 2022-01-24 DIAGNOSIS — I5032 Chronic diastolic (congestive) heart failure: Secondary | ICD-10-CM

## 2022-01-24 DIAGNOSIS — K219 Gastro-esophageal reflux disease without esophagitis: Secondary | ICD-10-CM

## 2022-01-24 DIAGNOSIS — E7800 Pure hypercholesterolemia, unspecified: Secondary | ICD-10-CM | POA: Diagnosis not present

## 2022-01-24 NOTE — Patient Instructions (Signed)
Medication Instructions:   Your physician recommends that you continue on your current medications as directed. Please refer to the Current Medication list given to you today.  *If you need a refill on your cardiac medications before your next appointment, please call your pharmacy*   Follow-Up: At [facility], you and your health needs are our priority.  As part of our continuing mission to provide you with exceptional heart care, we have created designated Provider Care Teams.  These Care Teams include your primary Cardiologist (physician) and Advanced Practice Providers (APPs -  Physician Assistants and Nurse Practitioners) who all work together to provide you with the care you need, when you need it.  We recommend signing up for the patient portal called "MyChart".  Sign up information is provided on this After Visit Summary.  MyChart is used to connect with patients for Virtual Visits (Telemedicine).  Patients are able to view lab/test results, encounter notes, upcoming appointments, etc.  Non-urgent messages can be sent to your provider as well.   To learn more about what you can do with MyChart, go to [email or web address]://[email or web address].com.    Your next appointment:   6 month(s)  The format for your next appointment:   In Person  Provider:   Heather E Pemberton, MD {   Important Information About Sugar       

## 2022-01-24 NOTE — Progress Notes (Signed)
?Cardiology Office Note:   ? ?Date:  01/24/2022  ? ?ID:  CAROLIN Barnes, DOB [birth year], MRN [id number] ? ?PCP:  Mosie Lukes, MD ?  ?[place]  ?Cardiologist:  Freada Bergeron, MD  ?Advanced Practice Provider:  Liliane Shi, PA-C ?Electrophysiologist:  None  ? ?Referring MD: Mosie Lukes, MD  ? ? ?History of Present Illness:   ? ?Anne Barnes is a 76 y.o. 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 Meda Coffee who now returns to clinic for follow-up. ? ?She was seen in January 2021 for concern of chest pain and a coronary CTA was performed that showed mild nonobstructive CAD and also findings of peribronchovascular nodules in the medial aspect of the right lower lobe with ill-defined margins, likely infectious or inflammatory in etiology.  Repeat CT scan on Jan 26, 2020 showed that those resolved and it confirmed inflammatory etiology.  She was started on Crestor 10 mg daily that she tolerates well. ? ?Saw Dr. Meda Coffee on 08/04/20 where she was mourning the loss of her husband of 20 years. She had mild LE edema but no orthopnea. Her spironolactone was increased to '25mg'$  daily. ? ?Seen in clinic on 04/25/21 by Richardson Dopp where she was doing well. Continuing to have issues with allergies. ? ?Was last seen in clinic on 10/2021 where she was having non exertional chest pain that was worsened by laying down or eating meals. Symptoms thought to be GERD in nature.  ? ?Today, the patient states that she is not feeling well, but states she is doing okay from a cardiac perspective. She complains of muscle spasms and cramping in her neck from physical therapy. Last night she only slept about 2.5 hours due to the pain. She has also needed to use her walker because of severe muscle spasms limiting her walking. ? ?She continues to suffer from "a lot" of acid reflux. She has been given  Prilosec and pepcid which do help relieve her symptoms. ? ?Her breathing is okay on average; every now and then she may be a little short of breath. She sleeps with a CPAP and denies any orthopnea or PND. ? ?She endorses some LE edema. She tries to avoid taking Lasix due to myalgias, but will take as needed. ? ?At home her blood pressure is well controlled, averaging in the 937J systolic. ? ?This past Monday she began a new diet, and reports losing 3 lbs. Her son lost over 100 lbs with this diet, eating mostly fruits and vegetables. ? ?She denies any palpitations, chest pain. No lightheadedness, headaches, syncope, orthopnea, or PND. ? ? ?Past Medical History:  ?Diagnosis Date  ? Acute combined systolic and diastolic heart failure ([place]) 05/26/2015  ? ALLERGIC RHINITIS   ? Allergic urticaria 08/01/2014  ? Patient reports allergies to shrimp, egg whites, tomatoes, dairy   ? Allergy   ? seasonal and numerous food and drug allergies.  ? 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  ? Office Spirometry 11/07/17-WNL-FVC 1.58/82%, FEV1 1.27/85%, ratio 0.80, FEF 25-75% 0.26/89%  ? Bradycardia 02/25/2017  ? Chiari malformation   ? Noted MRI brain 09/2013 - s/p neuro eval for same  ? DIVERTICULOSIS, COLON   ? Essential hypertension 08/28/2010  ? Qualifier: Diagnosis of  By: Reatha Armour, Lucy     ? Frequent headaches   ? GERD   ? H/O measles   ?  H/O mumps   ? Hearing loss 01/18/2017  ? History of chicken pox   ? Hyperglycemia 03/05/2016  ? HYPERTENSION   ? Hypocalcemia 02/25/2017  ? IBS (irritable bowel syndrome) 05/26/2015  ? Nonspecific abnormal electrocardiogram (ECG) (EKG)   ? OBSTRUCTIVE SLEEP APNEA 12/2008 dx  ? noncompliant with CPAP qhs  ? Obstructive sleep apnea 08/28/2010  ? NPSG 12/2008:  AHI 13/hr with desats to 78%   ? Preventative health care 06/11/2017  ? Sciatica   ? right side  ? Seasonal and perennial allergic rhinitis 08/28/2010  ? Allergy profile 03/11/14- positive especially for dust, cat and dog Food allergy profile- total IgE 86 with  several food group elevations Sed rate-WNL     ? Sinusitis 06/06/2017  ? Sleep apnea   ? uses cpap  ? 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  ? Vaginitis 01/18/2017  ? Vitamin D deficiency 06/11/2017  ? ? ?Past Surgical History:  ?Procedure Laterality Date  ? ABDOMINAL HYSTERECTOMY  1986  ? BREAST SURGERY  1973  ? Breast biopsy  ? COLONOSCOPY  2011  ? Dr Collene Mares  ? UMBILICAL HERNIA REPAIR    ? ? ?Current Medications: ?Current Meds  ?Medication Sig  ? albuterol (VENTOLIN HFA) 108 (90 Base) MCG/ACT inhaler INHALE 2 PUFFS BY MOUTH EVERY 6 HOURS AS NEEDED FOR WHEEZING OR SHORTNESS OF BREATH  ? alendronate (FOSAMAX) 70 MG tablet Take 1 tablet (70 mg total) by mouth once a week. Take with a full glass of water on an empty stomach.  ? ALPRAZolam (XANAX) 0.5 MG tablet TAKE 1 TABLET BY MOUTH TWICE DAILY AS NEEDED FOR ANXIETY AND FOR SLEEP  ? amLODipine (NORVASC) 2.5 MG tablet Take 1 tablet by mouth twice daily  ? aspirin EC 81 MG tablet Take 81 mg by mouth daily. Swallow whole.  ? azelastine (ASTELIN) 0.1 % nasal spray Place 2 sprays into both nostrils 2 (two) times daily as needed for rhinitis.  ? b complex vitamins tablet Take 1 tablet by mouth daily.  ? Cholecalciferol (VITAMIN D) 50 MCG (2000 UT) tablet Take 2,000 Units by mouth daily.  ? EPINEPHrine 0.3 mg/0.3 mL IJ SOAJ injection Inject 0.3 mg into the muscle as needed for anaphylaxis.  ? famotidine (PEPCID) 40 MG tablet Take 1 tablet (40 mg total) by mouth 2 (two) times daily.  ? fluticasone (FLONASE) 50 MCG/ACT nasal spray Place 2 sprays into both nostrils daily.  ? furosemide (LASIX) 20 MG tablet Take 1 tablet (20 mg total) by mouth 3 (three) times a week. Monday, Wednesday and Friday.  ? levocetirizine (XYZAL) 5 MG tablet TAKE 1 TABLET BY MOUTH ONCE DAILY IN THE EVENING  ? losartan (COZAAR) 100 MG tablet Take 1 tablet by mouth once daily  ? METAMUCIL FIBER PO Take by mouth. Take 1 spoon once daily  ? montelukast (SINGULAIR) 10 MG tablet Take 1 tablet (10 mg total) by mouth at  bedtime.  ? Multiple Vitamin (MULTIVITAMIN) tablet Take 1 tablet by mouth daily.  ? olopatadine (PATANOL) 0.1 % ophthalmic solution Place 1 drop into both eyes 2 (two) times daily.  ? omeprazole (PRILOSEC) 40 MG capsule Take 1 capsule (40 mg total) by mouth 2 (two) times daily.  ? rosuvastatin (CRESTOR) 10 MG tablet Take 1 tablet (10 mg total) by mouth daily.  ? spironolactone (ALDACTONE) 25 MG tablet Take 1 tablet (25 mg total) by mouth daily.  ?  ? ?Allergies:   Codeine, Eggs or egg-derived products, Hydralazine hcl, Lisinopril-hydrochlorothiazide, Metoprolol, Other, Shrimp [shellfish  allergy], and Tomato  ? ?Social History  ? ?Socioeconomic History  ? Marital status: Widowed  ?  Spouse name: Not on file  ? Number of children: Not on file  ? Years of education: Not on file  ? Highest education level: Not on file  ?Occupational History  ? Occupation: retired  ?Tobacco Use  ? Smoking status: Never  ? Smokeless tobacco: Never  ? Tobacco comments:  ?  Married, lives with spouse, dtr 2 g-kids.  ?Vaping Use  ? Vaping Use: Never used  ?Substance and Sexual Activity  ? Alcohol use: No  ?  Comment: rare  ? Drug use: No  ? Sexual activity: Not Currently  ?  Comment: lives with husband, no dietary restrictions avoid foods allergic to, retired  ?Other Topics Concern  ? Not on file  ?Social History Narrative  ? Right handed  ? One story with daughter and two grandkids  ? ?Social Determinants of Health  ? ?Financial Resource Strain: Low Risk   ? Difficulty of Paying Living Expenses: Not hard at all  ?Food Insecurity: No Food Insecurity  ? Worried About Charity fundraiser in the Last Year: Never true  ? Ran Out of Food in the Last Year: Never true  ?Transportation Needs: No Transportation Needs  ? Lack of Transportation (Medical): No  ? Lack of Transportation (Non-Medical): No  ?Physical Activity: Sufficiently Active  ? Days of Exercise per Week: 7 days  ? Minutes of Exercise per Session: 40 min  ?Stress: No Stress Concern  Present  ? Feeling of Stress : Not at all  ?Social Connections: Moderately Isolated  ? Frequency of Communication with Friends and Family: More than three times a week  ? Frequency of Social Gatherings with Denman George

## 2022-01-25 ENCOUNTER — Ambulatory Visit: Payer: Medicare HMO | Admitting: Physical Therapy

## 2022-01-30 ENCOUNTER — Encounter: Payer: Self-pay | Admitting: Physical Therapy

## 2022-01-30 ENCOUNTER — Ambulatory Visit: Payer: Medicare HMO | Admitting: Physical Therapy

## 2022-01-30 DIAGNOSIS — R252 Cramp and spasm: Secondary | ICD-10-CM

## 2022-01-30 DIAGNOSIS — M6281 Muscle weakness (generalized): Secondary | ICD-10-CM

## 2022-01-30 DIAGNOSIS — R293 Abnormal posture: Secondary | ICD-10-CM | POA: Diagnosis not present

## 2022-01-30 NOTE — Therapy (Signed)
[facility] [address] ?[place], [place], [address] ?Phone: [phone]   Fax:  [phone] ? ?Physical Therapy Treatment ? ?Patient Details  ?Name: Anne Barnes ?MRN: [id number] ?Date of Birth: Oct 30, 1945 ?Referring Provider (PT): Penni Homans ? ? ?Encounter Date: 01/30/2022 ? ? PT End of Session - 01/30/22 1527   ? ? Visit Number 6   ? Number of Visits 13   ? Date for PT Re-Evaluation 02/12/22   ? Authorization Type [employer]   ? Authorization Time Period 01/01/22 to 02/12/22   ? PT Start Time 3295   ? PT Stop Time 1525   ? PT Time Calculation (min) 40 min   ? Activity Tolerance Patient tolerated treatment well   ? Behavior During Therapy [facility] for tasks assessed/performed   ? ?  ?  ? ?  ? ? ?Past Medical History:  ?Diagnosis Date  ? Acute combined systolic and diastolic heart failure ([place]) 05/26/2015  ? ALLERGIC RHINITIS   ? Allergic urticaria 08/01/2014  ? Patient reports allergies to shrimp, egg whites, tomatoes, dairy   ? Allergy   ? seasonal and numerous food and drug allergies.  ? 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  ? Office Spirometry 11/07/17-WNL-FVC 1.58/82%, FEV1 1.27/85%, ratio 0.80, FEF 25-75% 0.26/89%  ? Bradycardia 02/25/2017  ? Chiari malformation   ? Noted MRI brain 09/2013 - s/p neuro eval for same  ? DIVERTICULOSIS, COLON   ? Essential hypertension 08/28/2010  ? Qualifier: Diagnosis of  By: Reatha Armour, Lucy     ? Frequent headaches   ? GERD   ? H/O measles   ? H/O mumps   ? Hearing loss 01/18/2017  ? History of chicken pox   ? Hyperglycemia 03/05/2016  ? HYPERTENSION   ? Hypocalcemia 02/25/2017  ? IBS (irritable bowel syndrome) 05/26/2015  ? Nonspecific abnormal electrocardiogram (ECG) (EKG)   ? OBSTRUCTIVE SLEEP APNEA 12/2008 dx  ? noncompliant with CPAP qhs  ? Obstructive sleep apnea 08/28/2010  ? NPSG 12/2008:  AHI 13/hr with desats to 78%   ? Preventative health care 06/11/2017  ? Sciatica   ? right side  ? Seasonal and perennial allergic rhinitis 08/28/2010  ?  Allergy profile 03/11/14- positive especially for dust, cat and dog Food allergy profile- total IgE 86 with several food group elevations Sed rate-WNL     ? Sinusitis 06/06/2017  ? Sleep apnea   ? uses cpap  ? 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  ? Vaginitis 01/18/2017  ? Vitamin D deficiency 06/11/2017  ? ? ?Past Surgical History:  ?Procedure Laterality Date  ? ABDOMINAL HYSTERECTOMY  1986  ? BREAST SURGERY  1973  ? Breast biopsy  ? COLONOSCOPY  2011  ? Dr Collene Mares  ? UMBILICAL HERNIA REPAIR    ? ? ?There were no vitals filed for this visit. ? ? Subjective Assessment - 01/30/22 1450   ? ? Subjective My allergies are driving me nuts today. I'm light headed because of my allergies. At night I tend to get a cramp in my neck, when this happens I put a travel pillow around my neck and it helps a lot with the support   ? Currently in Pain? No/denies   ? ?  ?  ? ?  ? ? ? ? ? ? ? ? ? ? ? ? ? ? ? ? ? ? ? ? [facility] PT Treatment/Exercise - 01/30/22 0001   ? ?  ? Neck Exercises: Seated  ? Other  Seated Exercise cervical and thoracic 3D excursions x20 each direction   ?  ? Manual Therapy  ? Manual Therapy Soft tissue mobilization   ? Soft tissue mobilization B UT and cervical paraspinals   ? ?  ?  ? ?  ? ? ? ? ? ? ? ? ? ? PT Education - 01/30/22 1527   ? ? Education Details Encouraged compliance with medication, also spent lots of time discussing activities and importance of regular exercise and finding activities she would enjoy, health benefits of regular activity.   ? Person(s) Educated Patient   ? Methods Explanation   ? Comprehension Verbalized understanding   ? ?  ?  ? ?  ? ? ? PT Short Term Goals - 01/01/22 1627   ? ?  ? PT SHORT TERM GOAL #1  ? Title Will be compliant with appropriate progressive HEP   ? Time 3   ? Period Weeks   ? Status New   ? Target Date 01/22/22   ?  ? PT SHORT TERM GOAL #2  ? Title Cervical and thoracic ROM to have improved by 50%   ? Time 3   ? Period Weeks   ? Status New   ?  ? PT SHORT TERM GOAL #3  ?  Title Will have better awareness of posture and postural mechanics during functional tasks   ? Time 3   ? Period Weeks   ? Status New   ? ?  ?  ? ?  ? ? ? ? PT Long Term Goals - 01/23/22 1510   ? ?  ? PT LONG TERM GOAL #1  ? Title MMT to have improved by one grade in all weak groups   ? Status On-going   ?  ? PT LONG TERM GOAL #2  ? Title Pain to be no more than 2/10 in order to improve QOL and functional task tolerance   ? Status On-going   ?  ? PT LONG TERM GOAL #3  ? Title Will be able to perform all functional dressing and homecare tasks without increase in pain   ? Status On-going   ? ?  ?  ? ?  ? ? ? ? ? ? ? ? Plan - 01/30/22 1527   ? ? Clinical Impression Statement [name] arrives today doing better, lightheaded but tells me she is often like this due to her allergies. BP 179/89 on first check, improved to 174/87 on the second check- forgot to take her BP meds today. Avoided vigorous activities such as UBE and weight training, focused more on gentle ROM based activities this session and finished with STM. Encouraged compliance with medication, also spent lots of time discussing activities and importance of regular exercise and finding activities she would enjoy, health benefits of regular activity.   ? Personal Factors and Comorbidities Time since onset of injury/illness/exacerbation   ? Examination-Activity Limitations Bathing;Bed Mobility;Reach Overhead;Carry;Dressing;Hygiene/Grooming;Lift   ? Examination-Participation Restrictions Cleaning;Community Activity;Shop;Volunteer;Laundry;[place] Work   ? Stability/Clinical Decision Making Stable/Uncomplicated   ? Clinical Decision Making Low   ? Rehab Potential Good   ? PT Frequency 2x / week   ? PT Duration 6 weeks   ? PT Treatment/Interventions Cryotherapy;Moist Heat;Traction;Electrical Stimulation;Iontophoresis '4mg'$ /ml Dexamethasone;Ultrasound;Therapeutic exercise;Neuromuscular re-education;Manual techniques;Therapeutic activities;ADLs/Self Care Home Management    ? PT Next Visit Plan posture training and strengthening, mobility/flexibility   ? PT Home Exercise Plan [phone]   ? Consulted and Agree with Plan of Care Patient   ? ?  ?  ? ?  ? ? ?  Patient will benefit from skilled therapeutic intervention in order to improve the following deficits and impairments:  Impaired sensation ? ?Visit Diagnosis: ?Cramp and spasm ? ?Muscle weakness (generalized) ? ?Abnormal posture ? ? ? ? ?Problem List ?Patient Active Problem List  ? Diagnosis Date Noted  ? Neck pain on left side 12/13/2021  ? Congestion of throat 12/13/2021  ? Osteoporosis 12/13/2021  ? COVID 04/20/2021  ? Chest pain of uncertain etiology 45/80/9983  ? Allergies 12/22/2019  ? Dizziness 11/19/2019  ? Grief reaction 08/27/2019  ? Left shoulder pain 08/27/2019  ? Hyperlipidemia 07/14/2018  ? Asthma, mild intermittent 06/06/2018  ? Morbid obesity ([place]) 01/09/2018  ? Vitamin D deficiency 06/11/2017  ? Preventative health care 06/11/2017  ? Hypocalcemia 02/25/2017  ? Bradycardia 02/25/2017  ? Hearing loss 01/18/2017  ? Hyperglycemia 03/05/2016  ? 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  ? CHF (congestive heart failure) ([place]) 05/26/2015  ? Female bladder prolapse 05/26/2015  ? IBS (irritable bowel syndrome) 05/26/2015  ? History of chicken pox   ? Allergic urticaria 08/01/2014  ? Food allergy 08/01/2014  ? Tinnitus 03/14/2014  ? Osteoarthritis   ? Nonspecific abnormal electrocardiogram (ECG) (EKG)   ? Headache 10/21/2013  ? Chiari malformation   ? GERD 11/10/2010  ? Obstructive sleep apnea 08/28/2010  ? Essential hypertension 08/28/2010  ? Seasonal and perennial allergic rhinitis 08/28/2010  ? DIVERTICULOSIS, COLON 08/28/2010  ? ?Antonios Ostrow U PT, DPT, PN2  ? ?Supplemental Physical Therapist ?[facility]  ? ? ? ? ? ?[place] [address] ?[address] ?[place], [place], [address] ?Phone: [phone]   Fax:  [phone] ? ?Name: Anne Barnes ?MRN: [id number] ?Date of Birth: 09-09-1946 ? ? ? ?

## 2022-02-01 ENCOUNTER — Ambulatory Visit: Payer: Medicare HMO | Admitting: Physical Therapy

## 2022-02-01 ENCOUNTER — Ambulatory Visit (INDEPENDENT_AMBULATORY_CARE_PROVIDER_SITE_OTHER): Payer: Medicare HMO

## 2022-02-01 ENCOUNTER — Encounter: Payer: Self-pay | Admitting: Physical Therapy

## 2022-02-01 DIAGNOSIS — R293 Abnormal posture: Secondary | ICD-10-CM

## 2022-02-01 DIAGNOSIS — R252 Cramp and spasm: Secondary | ICD-10-CM | POA: Diagnosis not present

## 2022-02-01 DIAGNOSIS — M6281 Muscle weakness (generalized): Secondary | ICD-10-CM

## 2022-02-01 DIAGNOSIS — J309 Allergic rhinitis, unspecified: Secondary | ICD-10-CM | POA: Diagnosis not present

## 2022-02-01 NOTE — Therapy (Signed)
[facility] ?[address] ?[address] ?[place], [place], [address] ?Phone: [phone]   Fax:  [phone] ? ?Physical Therapy Treatment ? ?Patient Details  ?Name: Anne Barnes ?MRN: [id number] ?Date of Birth: [birth year] ?Referring Provider (PT): Penni Homans ? ? ?Encounter Date: 02/01/2022 ? ? PT End of Session - 02/01/22 1534   ? ? Visit Number 7   ? Number of Visits 13   ? Date for PT Re-Evaluation 02/12/22   ? Authorization Type [employer]   ? Authorization Time Period 01/01/22 to 02/12/22   ? PT Start Time 1450   ? PT Stop Time 1528   ? PT Time Calculation (min) 38 min   ? Activity Tolerance Patient tolerated treatment well   ? Behavior During Therapy [facility] for tasks assessed/performed   ? ?  ?  ? ?  ? ? ?Past Medical History:  ?Diagnosis Date  ? Acute combined systolic and diastolic heart failure ([place]) 05/26/2015  ? ALLERGIC RHINITIS   ? Allergic urticaria 08/01/2014  ? Patient reports allergies to shrimp, egg whites, tomatoes, dairy   ? Allergy   ? seasonal and numerous food and drug allergies.  ? 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  ? Office Spirometry 11/07/17-WNL-FVC 1.58/82%, FEV1 1.27/85%, ratio 0.80, FEF 25-75% 0.26/89%  ? Bradycardia 02/25/2017  ? Chiari malformation   ? Noted MRI brain 09/2013 - s/p neuro eval for same  ? DIVERTICULOSIS, COLON   ? Essential hypertension 08/28/2010  ? Qualifier: Diagnosis of  By: Reatha Armour, Lucy     ? Frequent headaches   ? GERD   ? H/O measles   ? H/O mumps   ? Hearing loss 01/18/2017  ? History of chicken pox   ? Hyperglycemia 03/05/2016  ? HYPERTENSION   ? Hypocalcemia 02/25/2017  ? IBS (irritable bowel syndrome) 05/26/2015  ? Nonspecific abnormal electrocardiogram (ECG) (EKG)   ? OBSTRUCTIVE SLEEP APNEA 12/2008 dx  ? noncompliant with CPAP qhs  ? Obstructive sleep apnea 08/28/2010  ? NPSG 12/2008:  AHI 13/hr with desats to 78%   ? Preventative health care 06/11/2017  ? Sciatica   ? right side  ? Seasonal and perennial allergic rhinitis 08/28/2010  ?  Allergy profile 03/11/14- positive especially for dust, cat and dog Food allergy profile- total IgE 86 with several food group elevations Sed rate-WNL     ? Sinusitis 06/06/2017  ? Sleep apnea   ? uses cpap  ? 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  ? Vaginitis 01/18/2017  ? Vitamin D deficiency 06/11/2017  ? ? ?Past Surgical History:  ?Procedure Laterality Date  ? ABDOMINAL HYSTERECTOMY  1986  ? BREAST SURGERY  1973  ? Breast biopsy  ? COLONOSCOPY  2011  ? Dr Collene Mares  ? UMBILICAL HERNIA REPAIR    ? ? ?There were no vitals filed for this visit. ? ? Subjective Assessment - 02/01/22 1453   ? ? Subjective My blood pressure is still up, I've been taking my meds as scheduled. Most recent BP was in the 150s on top.   ? Currently in Pain? No/denies   ? ?  ?  ? ?  ? ? ? ? ? ? ? ? ? ? ? ? ? ? ? ? ? ? ? ? [place] Adult PT Treatment/Exercise - 02/01/22 0001   ? ?  ? Neck Exercises: Machines for Strengthening  ? UBE (Upper Arm Bike) L2.5 x3 min forward/3 min backward   ?  ? Neck Exercises: Standing  ? Other  Standing Exercises shoulder flexion and aBD 2# 2x10  B   ? Other Standing Exercises shoulder extensoins 2# WatE 2x10   ?  ? Neck Exercises: Seated  ? Other Seated Exercise seated OH 1press 1x10 B 2#   ?  ? Manual Therapy  ? Manual Therapy Soft tissue mobilization   ? Soft tissue mobilization B UT and cervical paraspinals   ? ?  ?  ? ?  ? ? ? ? ? ? ? ? ? ? PT Education - 02/01/22 1533   ? ? Education Details exercise form/purpose   ? Person(s) Educated Patient   ? Methods Explanation   ? Comprehension Verbalized understanding   ? ?  ?  ? ?  ? ? ? PT Short Term Goals - 01/01/22 1627   ? ?  ? PT SHORT TERM GOAL #1  ? Title Will be compliant with appropriate progressive HEP   ? Time 3   ? Period Weeks   ? Status New   ? Target Date 01/22/22   ?  ? PT SHORT TERM GOAL #2  ? Title Cervical and thoracic ROM to have improved by 50%   ? Time 3   ? Period Weeks   ? Status New   ?  ? PT SHORT TERM GOAL #3  ? Title Will have better awareness of posture  and postural mechanics during functional tasks   ? Time 3   ? Period Weeks   ? Status New   ? ?  ?  ? ?  ? ? ? ? PT Long Term Goals - 01/23/22 1510   ? ?  ? PT LONG TERM GOAL #1  ? Title MMT to have improved by one grade in all weak groups   ? Status On-going   ?  ? PT LONG TERM GOAL #2  ? Title Pain to be no more than 2/10 in order to improve QOL and functional task tolerance   ? Status On-going   ?  ? PT LONG TERM GOAL #3  ? Title Will be able to perform all functional dressing and homecare tasks without increase in pain   ? Status On-going   ? ?  ?  ? ?  ? ? ? ? ? ? ? ? Plan - 02/01/22 1534   ? ? Clinical Impression Statement Anne Barnes arrives today doing OK, BP still up at home but she has been doing better taking her medications as scheduled. BP per auto cuff 150/72, we were able to do a little more physical activity today. I think she has made a lot of progress, she tells me she is having issues reaching back to braid her hair but otherwise is feeling pretty good. Worked on B shoulder strength and ROM especially with overhead movements today. BUEs really fatigue quickly.   ? Personal Factors and Comorbidities Time since onset of injury/illness/exacerbation   ? Examination-Activity Limitations Bathing;Bed Mobility;Reach Overhead;Carry;Dressing;Hygiene/Grooming;Lift   ? Examination-Participation Restrictions Cleaning;Community Activity;Shop;Volunteer;Laundry;[employer] Work   ? Stability/Clinical Decision Making Stable/Uncomplicated   ? Clinical Decision Making Low   ? Rehab Potential Good   ? PT Frequency 2x / week   ? PT Duration 6 weeks   ? PT Treatment/Interventions Cryotherapy;Moist Heat;Traction;Electrical Stimulation;Iontophoresis '4mg'$ /ml Dexamethasone;Ultrasound;Therapeutic exercise;Neuromuscular re-education;Manual techniques;Therapeutic activities;ADLs/Self Care Home Management   ? PT Next Visit Plan posture training and strengthening, mobility/flexibility   ? PT Home Exercise Plan [phone]   ? Consulted and  Agree with Plan of Care Patient   ? ?  ?  ? ?  ? ? ?  Patient will benefit from skilled therapeutic intervention in order to improve the following deficits and impairments:  Impaired sensation ? ?Visit Diagnosis: ?Cramp and spasm ? ?Abnormal posture ? ?Muscle weakness (generalized) ? ? ? ? ?Problem List ?Patient Active Problem List  ? Diagnosis Date Noted  ? Neck pain on left side 12/13/2021  ? Congestion of throat 12/13/2021  ? Osteoporosis 12/13/2021  ? COVID 04/20/2021  ? Chest pain of uncertain etiology 12/75/1700  ? Allergies 12/22/2019  ? Dizziness 11/19/2019  ? Grief reaction 08/27/2019  ? Left shoulder pain 08/27/2019  ? Hyperlipidemia 07/14/2018  ? Asthma, mild intermittent 06/06/2018  ? Morbid obesity ([place]) 01/09/2018  ? Vitamin D deficiency 06/11/2017  ? Preventative health care 06/11/2017  ? Hypocalcemia 02/25/2017  ? Bradycardia 02/25/2017  ? Hearing loss 01/18/2017  ? Hyperglycemia 03/05/2016  ? 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  ? CHF (congestive heart failure) ([place]) 05/26/2015  ? Female bladder prolapse 05/26/2015  ? IBS (irritable bowel syndrome) 05/26/2015  ? History of chicken pox   ? Allergic urticaria 08/01/2014  ? Food allergy 08/01/2014  ? Tinnitus 03/14/2014  ? Osteoarthritis   ? Nonspecific abnormal electrocardiogram (ECG) (EKG)   ? Headache 10/21/2013  ? Chiari malformation   ? GERD 11/10/2010  ? Obstructive sleep apnea 08/28/2010  ? Essential hypertension 08/28/2010  ? Seasonal and perennial allergic rhinitis 08/28/2010  ? DIVERTICULOSIS, COLON 08/28/2010  ? ?Anne Barnes U PT, DPT, PN2  ? ?Supplemental Physical Therapist ?[facility]  ? ? ? ? ? ?[place] ?[address] ?[address] ?[place], [place], [address] ?Phone: [phone]   Fax:  [phone] ? ?Name: Anne Barnes ?MRN: [id number] ?Date of Birth: 01-06-1946 ? ? ? ?

## 2022-02-06 ENCOUNTER — Ambulatory Visit: Payer: Medicare HMO | Admitting: Physical Therapy

## 2022-02-06 ENCOUNTER — Encounter: Payer: Self-pay | Admitting: Physical Therapy

## 2022-02-06 ENCOUNTER — Other Ambulatory Visit: Payer: Self-pay | Admitting: Internal Medicine

## 2022-02-06 DIAGNOSIS — R293 Abnormal posture: Secondary | ICD-10-CM

## 2022-02-06 DIAGNOSIS — M6281 Muscle weakness (generalized): Secondary | ICD-10-CM

## 2022-02-06 DIAGNOSIS — R252 Cramp and spasm: Secondary | ICD-10-CM | POA: Diagnosis not present

## 2022-02-06 NOTE — Therapy (Signed)
[facility] ?[place] ?[address] ?[place], [place], [address] ?Phone: [phone]   Fax:  [phone] ? ?Physical Therapy Treatment ? ?Patient Details  ?Name: Anne Barnes ?MRN: [id number] ?Date of Birth: 15-Feb-1946 ?Referring Provider (PT): Penni Homans ? ? ?Encounter Date: 02/06/2022 ? ? PT End of Session - 02/06/22 1529   ? ? Visit Number 8   ? Number of Visits 13   ? Date for PT Re-Evaluation 03/06/22   ? Authorization Type [employer]   ? Authorization Time Period 01/01/22 to 02/12/22; extended to 6/13   ? PT Start Time 6599   ? PT Stop Time 1526   ? PT Time Calculation (min) 40 min   ? Activity Tolerance Patient tolerated treatment well   ? Behavior During Therapy [facility] for tasks assessed/performed   ? ?  ?  ? ?  ? ? ?Past Medical History:  ?Diagnosis Date  ? Acute combined systolic and diastolic heart failure ([place]) 05/26/2015  ? ALLERGIC RHINITIS   ? Allergic urticaria 08/01/2014  ? Patient reports allergies to shrimp, egg whites, tomatoes, dairy   ? Allergy   ? seasonal and numerous food and drug allergies.  ? 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  ? Office Spirometry 11/07/17-WNL-FVC 1.58/82%, FEV1 1.27/85%, ratio 0.80, FEF 25-75% 0.26/89%  ? Bradycardia 02/25/2017  ? Chiari malformation   ? Noted MRI brain 09/2013 - s/p neuro eval for same  ? DIVERTICULOSIS, COLON   ? Essential hypertension 08/28/2010  ? Qualifier: Diagnosis of  By: Reatha Armour, Lucy     ? Frequent headaches   ? GERD   ? H/O measles   ? H/O mumps   ? Hearing loss 01/18/2017  ? History of chicken pox   ? Hyperglycemia 03/05/2016  ? HYPERTENSION   ? Hypocalcemia 02/25/2017  ? IBS (irritable bowel syndrome) 05/26/2015  ? Nonspecific abnormal electrocardiogram (ECG) (EKG)   ? OBSTRUCTIVE SLEEP APNEA 12/2008 dx  ? noncompliant with CPAP qhs  ? Obstructive sleep apnea 08/28/2010  ? NPSG 12/2008:  AHI 13/hr with desats to 78%   ? Preventative health care 06/11/2017  ? Sciatica   ? right side  ? Seasonal and perennial allergic  rhinitis 08/28/2010  ? Allergy profile 03/11/14- positive especially for dust, cat and dog Food allergy profile- total IgE 86 with several food group elevations Sed rate-WNL     ? Sinusitis 06/06/2017  ? Sleep apnea   ? uses cpap  ? 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  ? Vaginitis 01/18/2017  ? Vitamin D deficiency 06/11/2017  ? ? ?Past Surgical History:  ?Procedure Laterality Date  ? ABDOMINAL HYSTERECTOMY  1986  ? BREAST SURGERY  1973  ? Breast biopsy  ? COLONOSCOPY  2011  ? Dr Collene Mares  ? UMBILICAL HERNIA REPAIR    ? ? ?There were no vitals filed for this visit. ? ? Subjective Assessment - 02/06/22 1448   ? ? Subjective I feel like I'm getting better, everything has been getting easier for me. I've been able to try doing some of those exercises on my own.   ? Currently in Pain? No/denies   ? ?  ?  ? ?  ? ? ? ? ? [facility] PT Assessment - 02/06/22 0001   ? ?  ? Assessment  ? Medical Diagnosis neck pain   ? Referring Provider (PT) Penni Homans   ? Onset Date/Surgical Date --   chronic  ? Next MD Visit Charlett Blake early June   ? Prior  Therapy none   ?  ? Precautions  ? Precautions None   ?  ? Restrictions  ? Weight Bearing Restrictions No   ?  ? Balance Screen  ? Has the patient fallen in the past 6 months No   ? Has the patient had a decrease in activity level because of a fear of falling?  Yes   ? Is the patient reluctant to leave their home because of a fear of falling?  No   ?  ? Home Environment  ? Living Environment Private residence   ? Additional Comments daughter and 2 sons live with her   ?  ? Prior Function  ? Level of Independence Independent;Independent with basic ADLs   ? Vocation Retired   ? Leisure being around great grandkids and daughter   ?  ? Observation/Other Assessments  ? Focus on Therapeutic Outcomes (FOTO)  53   was 37 at eval  ?  ? AROM  ? Right Shoulder Flexion 131 Degrees   ? Right Shoulder ABduction 130 Degrees   ? Right Shoulder Internal Rotation --   T12  ? Right Shoulder External Rotation --   FER C1  ? Left  Shoulder Flexion 135 Degrees   ? Left Shoulder ABduction 122 Degrees   ? Left Shoulder Internal Rotation --   T12  ? Left Shoulder External Rotation --   FER C1  ? Cervical Flexion WNL   ? Cervical Extension mild limitation   ? Cervical - Right Side Bend moderate limitation   ? Cervical - Left Side Bend moderate limitation   ? Cervical - Right Rotation mild limitation   ? Cervical - Left Rotation mild limitation   ? Thoracic Flexion WNL   ? Thoracic Extension moderate limitation   ? Thoracic - Right Side Bend mild limitation   ? Thoracic - Left Side Bend mild limitation   ? Thoracic - Right Rotation moderate limitation   ? Thoracic - Left Rotation moderate limitation   ?  ? Strength  ? Right Shoulder Flexion 4/5   ? Right Shoulder Extension 4/5   ? Right Shoulder ABduction 3+/5   ? Right Shoulder Internal Rotation 4+/5   ? Right Shoulder External Rotation 4+/5   ? Left Shoulder Flexion 4+/5   ? Left Shoulder Extension 5/5   ? Left Shoulder ABduction 4/5   ? Left Shoulder Internal Rotation 5/5   ? Left Shoulder External Rotation 5/5   ?  ? Palpation  ? Palpation comment very tight but no longe sore or tender   ? ?  ?  ? ?  ? ? ? ? ? ? ? ? ? ? ? ? ? ? ? ? [place] Adult PT Treatment/Exercise - 02/06/22 0001   ? ?  ? Neck Exercises: Machines for Strengthening  ? UBE (Upper Arm Bike) L2.5 x3 min forward/3 min backward   ?  ? Neck Exercises: Standing  ? Other Standing Exercises shoulder flexion and aBD 2# 1x10  B   ? Other Standing Exercises bicep curls 2# 1x10 B, tricep press 1x5 from chair   ? ?  ?  ? ?  ? ? ? ? ? ? ? ? ? ? PT Education - 02/06/22 1529   ? ? Education Details POC moving forward   ? Person(s) Educated Patient   ? Methods Explanation   ? Comprehension Verbalized understanding   ? ?  ?  ? ?  ? ? ? PT Short Term Goals -  02/06/22 1502   ? ?  ? PT SHORT TERM GOAL #1  ? Title Will be compliant with appropriate progressive HEP   ? Baseline 5/16- going well   ? Time 3   ? Period Weeks   ? Status Achieved   ?  ? PT  SHORT TERM GOAL #2  ? Title Cervical and thoracic ROM to have improved by 50%   ? Baseline 5/16- improving, some areas still tight other areas have improved   ? Time 3   ? Period Weeks   ? Status Partially Met   ?  ? PT SHORT TERM GOAL #3  ? Title Will have better awareness of posture and postural mechanics during functional tasks   ? Baseline 5/16- ongoing   ? Time 3   ? Period Weeks   ? Status On-going   ? ?  ?  ? ?  ? ? ? ? PT Long Term Goals - 02/06/22 1503   ? ?  ? PT LONG TERM GOAL #1  ? Title MMT to have improved by one grade in all weak groups   ? Baseline 5/16- ongoing   ? Time 6   ? Period Weeks   ? Status On-going   ?  ? PT LONG TERM GOAL #2  ? Title Pain to be no more than 2/10 in order to improve QOL and functional task tolerance   ? Baseline 5/16- no more than 2/10 at worst   ? Time 6   ? Period Weeks   ? Status Achieved   ?  ? PT LONG TERM GOAL #3  ? Title Will be able to perform all functional dressing and homecare tasks without increase in pain   ? Baseline 5/16- no pain   ? Time 6   ? Period Weeks   ? Status Achieved   ? ?  ?  ? ?  ? ? ? ? ? ? ? ? Plan - 02/06/22 1530   ? ? Clinical Impression Statement Anjelika arrives today reporting that she is feeling better, took objective measures and they do show consistent progress. She feels like she is close to where she needs to be functionally- main remaining impairments are postural and BUE strength as well as some lingering cervical/thoracic tightness. Will finish up remaining scheduled sessions (pending insurance approval) then DC per her request.   ? Personal Factors and Comorbidities Time since onset of injury/illness/exacerbation   ? Examination-Activity Limitations Bathing;Bed Mobility;Reach Overhead;Carry;Dressing;Hygiene/Grooming;Lift   ? Examination-Participation Restrictions Cleaning;Community Activity;Shop;Volunteer;Laundry;[employer] Work   ? Stability/Clinical Decision Making Stable/Uncomplicated   ? Clinical Decision Making Low   ? Rehab  Potential Good   ? PT Frequency 2x / week   ? PT Duration 6 weeks   ? PT Treatment/Interventions Cryotherapy;Moist Heat;Traction;Electrical Stimulation;Iontophoresis 41m/ml Dexamethasone;Ultrasound;Therapeutic exe

## 2022-02-08 ENCOUNTER — Encounter: Payer: Self-pay | Admitting: Physical Therapy

## 2022-02-08 ENCOUNTER — Ambulatory Visit: Payer: Medicare HMO | Admitting: Physical Therapy

## 2022-02-08 DIAGNOSIS — R293 Abnormal posture: Secondary | ICD-10-CM | POA: Diagnosis not present

## 2022-02-08 DIAGNOSIS — R252 Cramp and spasm: Secondary | ICD-10-CM | POA: Diagnosis not present

## 2022-02-08 DIAGNOSIS — M6281 Muscle weakness (generalized): Secondary | ICD-10-CM | POA: Diagnosis not present

## 2022-02-08 NOTE — Therapy (Signed)
[facility]. [place], [place], [address] Phone: [phone]   Fax:  [phone]  Physical Therapy Treatment  Patient Details  Name: Anne Barnes MRN: [id number] Date of Birth: 11-30-1945 Referring Provider (PT): Penni Homans   Encounter Date: 02/08/2022   PT End of Session - 02/08/22 1506     Visit Number 9    Date for PT Re-Evaluation 03/06/22    Authorization Type [employer]    PT Start Time 1438    PT Stop Time 1515    PT Time Calculation (min) 37 min    Activity Tolerance Patient limited by fatigue    Behavior During Therapy [facility] for tasks assessed/performed             Past Medical History:  Diagnosis Date   Acute combined systolic and diastolic heart failure ([place]) 05/26/2015   ALLERGIC RHINITIS    Allergic urticaria 08/01/2014   Patient reports allergies to shrimp, egg whites, tomatoes, dairy    Allergy    seasonal and numerous food and drug allergies.   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   Office Spirometry 11/07/17-WNL-FVC 1.58/82%, FEV1 1.27/85%, ratio 0.80, FEF 25-75% 0.26/89%   Bradycardia 02/25/2017   Chiari malformation    Noted MRI brain 09/2013 - s/p neuro eval for same   DIVERTICULOSIS, COLON    Essential hypertension 08/28/2010   Qualifier: Diagnosis of  By: Marca Ancona RMA, Lucy      Frequent headaches    GERD    H/O measles    H/O mumps    Hearing loss 01/18/2017   History of chicken pox    Hyperglycemia 03/05/2016   HYPERTENSION    Hypocalcemia 02/25/2017   IBS (irritable bowel syndrome) 05/26/2015   Nonspecific abnormal electrocardiogram (ECG) (EKG)    OBSTRUCTIVE SLEEP APNEA 12/2008 dx   noncompliant with CPAP qhs   Obstructive sleep apnea 08/28/2010   NPSG 12/2008:  AHI 13/hr with desats to 78%    Preventative health care 06/11/2017   Sciatica    right side   Seasonal and perennial allergic rhinitis 08/28/2010   Allergy profile 03/11/14- positive especially for dust, cat and dog Food allergy  profile- total IgE 86 with several food group elevations Sed rate-WNL      Sinusitis 06/06/2017   Sleep apnea    uses cpap   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   Vaginitis 01/18/2017   Vitamin D deficiency 06/11/2017    Past Surgical History:  Procedure Laterality Date   [place]   Breast biopsy   COLONOSCOPY  2011   Dr Collene Mares   UMBILICAL HERNIA REPAIR      There were no vitals filed for this visit.   Subjective Assessment - 02/08/22 1438     Subjective I feel better, movement is better, can reach the back of her head now    Currently in Pain? No/denies                               [facility] Adult PT Treatment/Exercise - 02/08/22 0001       Neck Exercises: Machines for Strengthening   UBE (Upper Arm Bike) L2.5 x3 min forward/3 min backward    Other Machines for Strengthening shoulder extensions 5lb 2x10, Rows 10lb 2x10      Neck Exercises: Standing   Other Standing Exercises shoulder flexion and Abd 2# 1x10  B  Neck Exercises: Seated   Postural Training S2S w/ red ball OHP 2x10                       PT Short Term Goals - 02/06/22 1502       PT SHORT TERM GOAL #1   Title Will be compliant with appropriate progressive HEP    Baseline 5/16- going well    Time 3    Period Weeks    Status Achieved      PT SHORT TERM GOAL #2   Title Cervical and thoracic ROM to have improved by 50%    Baseline 5/16- improving, some areas still tight other areas have improved    Time 3    Period Weeks    Status Partially Met      PT SHORT TERM GOAL #3   Title Will have better awareness of posture and postural mechanics during functional tasks    Baseline 5/16- ongoing    Time 3    Period Weeks    Status On-going               PT Long Term Goals - 02/06/22 1503       PT LONG TERM GOAL #1   Title MMT to have improved by one grade in all weak groups    Baseline 5/16- ongoing    Time 6    Period  Weeks    Status On-going      PT LONG TERM GOAL #2   Title Pain to be no more than 2/10 in order to improve QOL and functional task tolerance    Baseline 5/16- no more than 2/10 at worst    Time 6    Period Weeks    Status Achieved      PT LONG TERM GOAL #3   Title Will be able to perform all functional dressing and homecare tasks without increase in pain    Baseline 5/16- no pain    Time 6    Period Weeks    Status Achieved                   Plan - 02/08/22 1507     Clinical Impression Statement Pt enters ~ 8 minutes late for today session. She enters reporting improvement with her mobility. Tactile cue to prevent trunk flexion with standing shoulder ext and rows. Some fatigue and shortness of breath with sit to stands. Pt stated her wheezing comes from over mucus production in her throat. Pt used inhaler with little improvement. Wheezing returned with standing abduction, pt decided to discontinue session due to her breathing.    Personal Factors and Comorbidities Time since onset of injury/illness/exacerbation    Examination-Activity Limitations Bathing;Bed Mobility;Reach Overhead;Carry;Dressing;Hygiene/Grooming;Lift    Examination-Participation Restrictions Cleaning;Community Activity;Shop;Volunteer;Laundry;Yard Work    Stability/Clinical Decision Making Stable/Uncomplicated    Rehab Potential Good    PT Frequency 2x / week    PT Duration 6 weeks    PT Treatment/Interventions Cryotherapy;Moist Heat;Traction;Electrical Stimulation;Iontophoresis 4mg /ml Dexamethasone;Ultrasound;Therapeutic exercise;Neuromuscular re-education;Manual techniques;Therapeutic activities;ADLs/Self Care Home Management    PT Next Visit Plan posture training and strengthening, mobility/flexibility             Patient will benefit from skilled therapeutic intervention in order to improve the following deficits and impairments:  Impaired sensation  Visit Diagnosis: Cramp and spasm  Muscle  weakness (generalized)  Abnormal posture     Problem List Patient Active Problem List   Diagnosis Date Noted  Neck pain on left side 12/13/2021   Congestion of throat 12/13/2021   Osteoporosis 12/13/2021   COVID 04/20/2021   Chest pain of uncertain etiology 41/32/4401   Allergies 12/22/2019   Dizziness 11/19/2019   Grief reaction 08/27/2019   Left shoulder pain 08/27/2019   Hyperlipidemia 07/14/2018   Asthma, mild intermittent 06/06/2018   Morbid obesity ([place]) 01/09/2018   Vitamin D deficiency 06/11/2017   Preventative health care 06/11/2017   Hypocalcemia 02/25/2017   Bradycardia 02/25/2017   Hearing loss 01/18/2017   Hyperglycemia 03/05/2016   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   CHF (congestive heart failure) ([place]) 05/26/2015   Female bladder prolapse 05/26/2015   IBS (irritable bowel syndrome) 05/26/2015   History of chicken pox    Allergic urticaria 08/01/2014   Food allergy 08/01/2014   Tinnitus 03/14/2014   Osteoarthritis    Nonspecific abnormal electrocardiogram (ECG) (EKG)    Headache 10/21/2013   Chiari malformation    GERD 11/10/2010   Obstructive sleep apnea 08/28/2010   Essential hypertension 08/28/2010   Seasonal and perennial allergic rhinitis 08/28/2010   DIVERTICULOSIS, COLON 08/28/2010    Scot Jun, PTA 02/08/2022, 3:12 PM  [facility]. [place], [place], [address] Phone: [phone]   Fax:  [phone]  Name: LEATHIE WEICH MRN: [id number] Date of Birth: July 03, 1946

## 2022-02-12 ENCOUNTER — Ambulatory Visit: Payer: Medicare HMO | Admitting: Podiatry

## 2022-02-13 ENCOUNTER — Ambulatory Visit (HOSPITAL_BASED_OUTPATIENT_CLINIC_OR_DEPARTMENT_OTHER)
Admission: RE | Admit: 2022-02-13 | Discharge: 2022-02-13 | Disposition: A | Discharge status: Home / Self Care | Payer: Medicare HMO | Source: Ambulatory Visit | Attending: Family Medicine | Admitting: Family Medicine

## 2022-02-13 ENCOUNTER — Ambulatory Visit: Payer: Medicare HMO | Admitting: Physical Therapy

## 2022-02-13 DIAGNOSIS — M542 Cervicalgia: Secondary | ICD-10-CM | POA: Insufficient documentation

## 2022-02-15 ENCOUNTER — Ambulatory Visit: Payer: Medicare HMO | Admitting: Physical Therapy

## 2022-02-15 ENCOUNTER — Encounter: Payer: Self-pay | Admitting: Physical Therapy

## 2022-02-15 DIAGNOSIS — R293 Abnormal posture: Secondary | ICD-10-CM | POA: Diagnosis not present

## 2022-02-15 DIAGNOSIS — R252 Cramp and spasm: Secondary | ICD-10-CM

## 2022-02-15 DIAGNOSIS — M6281 Muscle weakness (generalized): Secondary | ICD-10-CM

## 2022-02-15 NOTE — Therapy (Signed)
[place]. [place], [place], [address] Phone: [phone]   Fax:  [phone] Progress Note Reporting Period 01/01/22 to 02/15/22  See note below for Objective Data and Assessment of Progress/Goals.     Physical Therapy Treatment  Patient Details  Name: Anne Barnes MRN: [id number] Date of Birth: 01-19-1946 Referring Provider (PT): Penni Homans   Encounter Date: 02/15/2022   PT End of Session - 02/15/22 1512     Visit Number 10    Date for PT Re-Evaluation 03/06/22    Authorization Type [employer]    Authorization Time Period 01/01/22 to 02/12/22; extended to 6/13    Activity Tolerance Patient tolerated treatment well    Behavior During Therapy [facility] for tasks assessed/performed             Past Medical History:  Diagnosis Date   Acute combined systolic and diastolic heart failure ([place]) 05/26/2015   ALLERGIC RHINITIS    Allergic urticaria 08/01/2014   Patient reports allergies to shrimp, egg whites, tomatoes, dairy    Allergy    seasonal and numerous food and drug allergies.   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   Office Spirometry 11/07/17-WNL-FVC 1.58/82%, FEV1 1.27/85%, ratio 0.80, FEF 25-75% 0.26/89%   Bradycardia 02/25/2017   Chiari malformation    Noted MRI brain 09/2013 - s/p neuro eval for same   DIVERTICULOSIS, COLON    Essential hypertension 08/28/2010   Qualifier: Diagnosis of  By: Marca Ancona RMA, Lucy      Frequent headaches    GERD    H/O measles    H/O mumps    Hearing loss 01/18/2017   History of chicken pox    Hyperglycemia 03/05/2016   HYPERTENSION    Hypocalcemia 02/25/2017   IBS (irritable bowel syndrome) 05/26/2015   Nonspecific abnormal electrocardiogram (ECG) (EKG)    OBSTRUCTIVE SLEEP APNEA 12/2008 dx   noncompliant with CPAP qhs   Obstructive sleep apnea 08/28/2010   NPSG 12/2008:  AHI 13/hr with desats to 78%    Preventative health care 06/11/2017   Sciatica    right side   Seasonal and  perennial allergic rhinitis 08/28/2010   Allergy profile 03/11/14- positive especially for dust, cat and dog Food allergy profile- total IgE 86 with several food group elevations Sed rate-WNL      Sinusitis 06/06/2017   Sleep apnea    uses cpap   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   Vaginitis 01/18/2017   Vitamin D deficiency 06/11/2017    Past Surgical History:  Procedure Laterality Date   [place]   Breast biopsy   COLONOSCOPY  2011   Dr Collene Mares   UMBILICAL HERNIA REPAIR      There were no vitals filed for this visit.   Subjective Assessment - 02/15/22 1436     Subjective Feeling a little bit weak today and I have sinus headache.    Currently in Pain? Yes    Pain Score 5     Pain Descriptors / Indicators Headache                [facility] PT Assessment - 02/15/22 0001       Strength   Right Shoulder Flexion 4+/5    Right Shoulder ABduction 4-/5    Left Shoulder ABduction 4+/5  [place] Adult PT Treatment/Exercise - 02/15/22 0001       Neck Exercises: Machines for Strengthening   Nustep L2 x6 min      Neck Exercises: Standing   Other Standing Exercises shoulder flexion and Abd 1# 2x10  B    Other Standing Exercises Rows & Ext green 2x10      Neck Exercises: Seated   Shoulder Rolls Backwards;10 reps    Postural Training S2S w/ red ball OHP 2x10    Other Seated Exercise Horiz abd red 2x10    Other Seated Exercise ER yellow 2x10                       PT Short Term Goals - 02/06/22 1502       PT SHORT TERM GOAL #1   Title Will be compliant with appropriate progressive HEP    Baseline 5/16- going well    Time 3    Period Weeks    Status Achieved      PT SHORT TERM GOAL #2   Title Cervical and thoracic ROM to have improved by 50%    Baseline 5/16- improving, some areas still tight other areas have improved    Time 3    Period Weeks    Status Partially Met      PT  SHORT TERM GOAL #3   Title Will have better awareness of posture and postural mechanics during functional tasks    Baseline 5/16- ongoing    Time 3    Period Weeks    Status On-going               PT Long Term Goals - 02/15/22 1512       PT LONG TERM GOAL #1   Title MMT to have improved by one grade in all weak groups    Status Partially Met      PT LONG TERM GOAL #2   Title Pain to be no more than 2/10 in order to improve QOL and functional task tolerance    Status Partially Met      PT LONG TERM GOAL #3   Title Will be able to perform all functional dressing and homecare tasks without increase in pain    Status Achieved                   Plan - 02/15/22 1514     Clinical Impression Statement Pt ` 5 min late for today's session. Pt has progressed towards all LTG's increasing her UE shoulder strength. Tactile cues to elbows needed with ER to maintain proper positioning. Pt continues to fatigue with sit to stands. Postural cues required with standing roes and extensions.    Personal Factors and Comorbidities Time since onset of injury/illness/exacerbation    Examination-Activity Limitations Bathing;Bed Mobility;Reach Overhead;Carry;Dressing;Hygiene/Grooming;Lift    Examination-Participation Restrictions Cleaning;Community Activity;Shop;Volunteer;Laundry;Yard Work    Stability/Clinical Decision Making Stable/Uncomplicated    Rehab Potential Good    PT Frequency 2x / week    PT Duration 6 weeks             Patient will benefit from skilled therapeutic intervention in order to improve the following deficits and impairments:  Impaired sensation  Visit Diagnosis: Cramp and spasm  Muscle weakness (generalized)  Abnormal posture     Problem List Patient Active Problem List   Diagnosis Date Noted   Neck pain on left side 12/13/2021   Congestion of throat 12/13/2021   Osteoporosis 12/13/2021  COVID 04/20/2021   Chest pain of uncertain etiology  46/88/7373   Allergies 12/22/2019   Dizziness 11/19/2019   Grief reaction 08/27/2019   Left shoulder pain 08/27/2019   Hyperlipidemia 07/14/2018   Asthma, mild intermittent 06/06/2018   Morbid obesity ([place]) 01/09/2018   Vitamin D deficiency 06/11/2017   Preventative health care 06/11/2017   Hypocalcemia 02/25/2017   Bradycardia 02/25/2017   Hearing loss 01/18/2017   Hyperglycemia 03/05/2016   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   CHF (congestive heart failure) ([place]) 05/26/2015   Female bladder prolapse 05/26/2015   IBS (irritable bowel syndrome) 05/26/2015   History of chicken pox    Allergic urticaria 08/01/2014   Food allergy 08/01/2014   Tinnitus 03/14/2014   Osteoarthritis    Nonspecific abnormal electrocardiogram (ECG) (EKG)    Headache 10/21/2013   Chiari malformation    GERD 11/10/2010   Obstructive sleep apnea 08/28/2010   Essential hypertension 08/28/2010   Seasonal and perennial allergic rhinitis 08/28/2010   DIVERTICULOSIS, COLON 08/28/2010    Scot Jun, PTA 02/15/2022, 3:16 PM  [place]. [place], [place], [address] Phone: [phone]   Fax:  [phone]  Name: Anne Barnes MRN: [id number] Date of Birth: 22-May-1946

## 2022-02-16 ENCOUNTER — Other Ambulatory Visit: Payer: Self-pay | Admitting: Family Medicine

## 2022-02-16 ENCOUNTER — Ambulatory Visit (INDEPENDENT_AMBULATORY_CARE_PROVIDER_SITE_OTHER): Payer: Medicare HMO

## 2022-02-16 DIAGNOSIS — J309 Allergic rhinitis, unspecified: Secondary | ICD-10-CM | POA: Diagnosis not present

## 2022-02-16 MED ORDER — ALPRAZOLAM 0.5 MG PO TABS: ORAL_TABLET | ORAL | 1 refills | Status: DC

## 2022-02-16 NOTE — Telephone Encounter (Signed)
Pt is referring to alprazolam. Pt states 20 tablets lasts her for about a year. Refill pended.

## 2022-02-16 NOTE — Telephone Encounter (Signed)
Patient states she talked to Dr. Charlett Blake on her 03/21 VV, and anxiety medication was going to be sent to her pharmacy but she never received anything. Please advice.    [facility] ([address]), [place]  [address] ([place]) [place] [address]  Phone:  [phone]  Fax:  [phone]

## 2022-02-20 ENCOUNTER — Ambulatory Visit: Payer: Medicare HMO | Admitting: Physical Therapy

## 2022-02-20 ENCOUNTER — Encounter: Payer: Self-pay | Admitting: Physical Therapy

## 2022-02-20 DIAGNOSIS — R293 Abnormal posture: Secondary | ICD-10-CM | POA: Diagnosis not present

## 2022-02-20 DIAGNOSIS — M6281 Muscle weakness (generalized): Secondary | ICD-10-CM | POA: Diagnosis not present

## 2022-02-20 DIAGNOSIS — R252 Cramp and spasm: Secondary | ICD-10-CM

## 2022-02-20 NOTE — Therapy (Signed)
[place]. [place], [place], [address] Phone: [phone]   Fax:  [phone]  Physical Therapy Treatment  Patient Details  Name: Anne Barnes MRN: [id number] Date of Birth: [birth year] Referring Provider (PT): Penni Homans   Encounter Date: 02/20/2022   PT End of Session - 02/20/22 1530     Visit Number 11    Number of Visits 13    Date for PT Re-Evaluation 03/06/22    Authorization Type [employer]    Authorization Time Period 01/01/22 to 02/12/22; extended to 6/13    PT Start Time 1457   arrived late   PT Stop Time 1528    PT Time Calculation (min) 31 min    Activity Tolerance Patient tolerated treatment well    Behavior During Therapy [facility] for tasks assessed/performed             Past Medical History:  Diagnosis Date   Acute combined systolic and diastolic heart failure ([place]) 05/26/2015   ALLERGIC RHINITIS    Allergic urticaria 08/01/2014   Patient reports allergies to shrimp, egg whites, tomatoes, dairy    Allergy    seasonal and numerous food and drug allergies.   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   Office Spirometry 11/07/17-WNL-FVC 1.58/82%, FEV1 1.27/85%, ratio 0.80, FEF 25-75% 0.26/89%   Bradycardia 02/25/2017   Chiari malformation    Noted MRI brain 09/2013 - s/p neuro eval for same   DIVERTICULOSIS, COLON    Essential hypertension 08/28/2010   Qualifier: Diagnosis of  By: Marca Ancona RMA, Lucy      Frequent headaches    GERD    H/O measles    H/O mumps    Hearing loss 01/18/2017   History of chicken pox    Hyperglycemia 03/05/2016   HYPERTENSION    Hypocalcemia 02/25/2017   IBS (irritable bowel syndrome) 05/26/2015   Nonspecific abnormal electrocardiogram (ECG) (EKG)    OBSTRUCTIVE SLEEP APNEA 12/2008 dx   noncompliant with CPAP qhs   Obstructive sleep apnea 08/28/2010   NPSG 12/2008:  AHI 13/hr with desats to 78%    Preventative health care 06/11/2017   Sciatica    right side   Seasonal and perennial  allergic rhinitis 08/28/2010   Allergy profile 03/11/14- positive especially for dust, cat and dog Food allergy profile- total IgE 86 with several food group elevations Sed rate-WNL      Sinusitis 06/06/2017   Sleep apnea    uses cpap   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   Vaginitis 01/18/2017   Vitamin D deficiency 06/11/2017    Past Surgical History:  Procedure Laterality Date   [place]   Breast biopsy   COLONOSCOPY  2011   Dr Collene Mares   UMBILICAL HERNIA REPAIR      There were no vitals filed for this visit.   Subjective Assessment - 02/20/22 1457     Subjective I'm doing OK, sometimes I wake up feeling good and end up feeling lousy that's just how it is. Let's make thursday my last day    Currently in Pain? No/denies                               [facility] Adult PT Treatment/Exercise - 02/20/22 0001       Neck Exercises: Machines for Strengthening   Nustep L2.5 x3 min forward/3 min backward  Neck Exercises: Standing   Other Standing Exercises OH Press 1x10 B; forward punch 2# weight 1x10 B; over the shoulders 2kg ball 1x10 B    Other Standing Exercises standing supermen rows 1x10 B 3#; wall pushups 1x10; ball walls x10 CW/x10 CCW B                     PT Education - 02/20/22 1530     Education Details DC  next session    Person(s) Educated Patient    Methods Explanation    Comprehension Verbalized understanding              PT Short Term Goals - 02/06/22 1502       PT SHORT TERM GOAL #1   Title Will be compliant with appropriate progressive HEP    Baseline 5/16- going well    Time 3    Period Weeks    Status Achieved      PT SHORT TERM GOAL #2   Title Cervical and thoracic ROM to have improved by 50%    Baseline 5/16- improving, some areas still tight other areas have improved    Time 3    Period Weeks    Status Partially Met      PT SHORT TERM GOAL #3   Title Will have better  awareness of posture and postural mechanics during functional tasks    Baseline 5/16- ongoing    Time 3    Period Weeks    Status On-going               PT Long Term Goals - 02/15/22 1512       PT LONG TERM GOAL #1   Title MMT to have improved by one grade in all weak groups    Status Partially Met      PT LONG TERM GOAL #2   Title Pain to be no more than 2/10 in order to improve QOL and functional task tolerance    Status Partially Met      PT LONG TERM GOAL #3   Title Will be able to perform all functional dressing and homecare tasks without increase in pain    Status Achieved                   Plan - 02/20/22 1531     Clinical Impression Statement Anne Barnes arrived late today but doing OK. We continued focusing on strength especially in muscles around shoulders. She would like to make Thursday her last day- will make sure HEP is appropriate next session before we formally DC.    Personal Factors and Comorbidities Time since onset of injury/illness/exacerbation    Examination-Activity Limitations Bathing;Bed Mobility;Reach Overhead;Carry;Dressing;Hygiene/Grooming;Lift    Examination-Participation Restrictions Cleaning;Community Activity;Shop;Volunteer;Laundry;Yard Work    Stability/Clinical Decision Making Stable/Uncomplicated    Designer, [employer]ell[employer]    Rehab Potential Good    PT Frequency 2x / week    PT Duration 6 weeks    PT Treatment/Interventions Cryotherapy;Moist Heat;Traction;Electrical Stimulation;Iontophoresis 82m/ml Dexamethasone;Ultrasound;Therapeutic exercise;Neuromuscular re-education;Manual techniques;Therapeutic activities;ADLs/Self Care Home Management    PT Next Visit Plan DC, finalize HEP    PT Home Exercise Plan [id number]   Consulted and Agree with Plan of Care Patient             Patient will benefit from skilled therapeutic intervention in order to improve the following deficits and impairments:  Impaired sensation  Visit  Diagnosis: Cramp and spasm  Abnormal posture  Muscle weakness (  generalized)     Problem List Patient Active Problem List   Diagnosis Date Noted   Neck pain on left side 12/13/2021   Congestion of throat 12/13/2021   Osteoporosis 12/13/2021   COVID 04/20/2021   Chest pain of uncertain etiology 04/59/1368   Allergies 12/22/2019   Dizziness 11/19/2019   Grief reaction 08/27/2019   Left shoulder pain 08/27/2019   Hyperlipidemia 07/14/2018   Asthma, mild intermittent 06/06/2018   Morbid obesity ([place]) 01/09/2018   Vitamin D deficiency 06/11/2017   Preventative health care 06/11/2017   Hypocalcemia 02/25/2017   Bradycardia 02/25/2017   Hearing loss 01/18/2017   Hyperglycemia 03/05/2016   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   CHF (congestive heart failure) ([place]) 05/26/2015   Female bladder prolapse 05/26/2015   IBS (irritable bowel syndrome) 05/26/2015   History of chicken pox    Allergic urticaria 08/01/2014   Food allergy 08/01/2014   Tinnitus 03/14/2014   Osteoarthritis    Nonspecific abnormal electrocardiogram (ECG) (EKG)    Headache 10/21/2013   Chiari malformation    GERD 11/10/2010   Obstructive sleep apnea 08/28/2010   Essential hypertension 08/28/2010   Seasonal and perennial allergic rhinitis 08/28/2010   DIVERTICULOSIS, COLON 08/28/2010   Ann Lions PT, DPT, PN2   [facility] [place]. [place], [place], [address] Phone: [phone]   Fax:  [phone]  Name: Anne Barnes MRN: [id number] Date of Birth: [birth year]

## 2022-02-22 ENCOUNTER — Ambulatory Visit: Payer: Medicare HMO | Attending: Family Medicine | Admitting: Physical Therapy

## 2022-02-22 ENCOUNTER — Encounter: Payer: Self-pay | Admitting: Physical Therapy

## 2022-02-22 DIAGNOSIS — R293 Abnormal posture: Secondary | ICD-10-CM | POA: Diagnosis not present

## 2022-02-22 DIAGNOSIS — R252 Cramp and spasm: Secondary | ICD-10-CM | POA: Diagnosis not present

## 2022-02-22 DIAGNOSIS — M6281 Muscle weakness (generalized): Secondary | ICD-10-CM

## 2022-02-22 NOTE — Therapy (Signed)
[place]. [place], [place], [address] Phone: [phone]   Fax:  [phone]  Physical Therapy Treatment  Patient Details  Name: Anne Barnes MRN: [id number] Date of Birth: 1946/09/08 Referring Provider (PT): Penni Homans   Encounter Date: 02/22/2022   PT End of Session - 02/22/22 1513     Visit Number 12    Date for PT Re-Evaluation 03/06/22    PT Start Time 1443    PT Stop Time 1515    PT Time Calculation (min) 32 min    Activity Tolerance Patient tolerated treatment well    Behavior During Therapy [facility] for tasks assessed/performed             Past Medical History:  Diagnosis Date   Acute combined systolic and diastolic heart failure ([place]) 05/26/2015   ALLERGIC RHINITIS    Allergic urticaria 08/01/2014   Patient reports allergies to shrimp, egg whites, tomatoes, dairy    Allergy    seasonal and numerous food and drug allergies.   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   Office Spirometry 11/07/17-WNL-FVC 1.58/82%, FEV1 1.27/85%, ratio 0.80, FEF 25-75% 0.26/89%   Bradycardia 02/25/2017   Chiari malformation    Noted MRI brain 09/2013 - s/p neuro eval for same   DIVERTICULOSIS, COLON    Essential hypertension 08/28/2010   Qualifier: Diagnosis of  By: Marca Ancona RMA, Lucy      Frequent headaches    GERD    H/O measles    H/O mumps    Hearing loss 01/18/2017   History of chicken pox    Hyperglycemia 03/05/2016   HYPERTENSION    Hypocalcemia 02/25/2017   IBS (irritable bowel syndrome) 05/26/2015   Nonspecific abnormal electrocardiogram (ECG) (EKG)    OBSTRUCTIVE SLEEP APNEA 12/2008 dx   noncompliant with CPAP qhs   Obstructive sleep apnea 08/28/2010   NPSG 12/2008:  AHI 13/hr with desats to 78%    Preventative health care 06/11/2017   Sciatica    right side   Seasonal and perennial allergic rhinitis 08/28/2010   Allergy profile 03/11/14- positive especially for dust, cat and dog Food allergy profile- total IgE 86 with  several food group elevations Sed rate-WNL      Sinusitis 06/06/2017   Sleep apnea    uses cpap   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   Vaginitis 01/18/2017   Vitamin D deficiency 06/11/2017    Past Surgical History:  Procedure Laterality Date   [place]   Breast biopsy   COLONOSCOPY  2011   Dr Collene Mares   UMBILICAL HERNIA REPAIR      There were no vitals filed for this visit.   Subjective Assessment - 02/22/22 1443     Subjective "Actually Im better today, feels looser up top, I feel like I made some progress."    Currently in Pain? No/denies                               [facility] Adult PT Treatment/Exercise - 02/22/22 0001       Neck Exercises: Machines for Strengthening   Nustep L2.3 x2 min forward/2 min backward      Neck Exercises: Standing   Other Standing Exercises shoulder bilat flexion and Abd 1# 2x10  B    Other Standing Exercises Standing Flex 1lb WaTE 2x10, Shoulder Ext 5lb Rows 10lb 2x10  Neck Exercises: Seated   Postural Training S2S w/ red ball OHP 2x10    Other Seated Exercise Horiz abd red 2x10    Other Seated Exercise ER red 2x10                       PT Short Term Goals - 02/22/22 1514       PT SHORT TERM GOAL #2   Title Cervical and thoracic ROM to have improved by 50%    Status Partially Met      PT SHORT TERM GOAL #3   Title Will have better awareness of posture and postural mechanics during functional tasks    Status Achieved               PT Long Term Goals - 02/22/22 1514       PT LONG TERM GOAL #1   Title MMT to have improved by one grade in all weak groups    Status Partially Met      PT LONG TERM GOAL #2   Title Pain to be no more than 2/10 in order to improve QOL and functional task tolerance    Status Achieved      PT LONG TERM GOAL #3   Title Will be able to perform all functional dressing and homecare tasks without increase in pain    Status  Achieved                   Plan - 02/22/22 1513     Clinical Impression Statement Pt ~ 12 minutes late feeling ok. Pt enters reporting improvement overall. Pt reports that her daughter has bought her weights so she can workout at home. Pt has progressed meeting most goals. Some cue needed for to maintain correct UE movement pattern with horizontal abd and ER.    Personal Factors and Comorbidities Time since onset of injury/illness/exacerbation    Examination-Activity Limitations Bathing;Bed Mobility;Reach Overhead;Carry;Dressing;Hygiene/Grooming;Lift    Examination-Participation Restrictions Cleaning;Community Activity;Shop;Volunteer;Laundry;Yard Work    Stability/Clinical Decision Making Stable/Uncomplicated    Rehab Potential Good    PT Treatment/Interventions Cryotherapy;Moist Heat;Traction;Electrical Stimulation;Iontophoresis 31m/ml Dexamethasone;Ultrasound;Therapeutic exercise;Neuromuscular re-education;Manual techniques;Therapeutic activities;ADLs/Self Care Home Management    PT Next Visit Plan D/C PT             Patient will benefit from skilled therapeutic intervention in order to improve the following deficits and impairments:  Impaired sensation  Visit Diagnosis: Abnormal posture  Muscle weakness (generalized)  Cramp and spasm     Problem List Patient Active Problem List   Diagnosis Date Noted   Neck pain on left side 12/13/2021   Congestion of throat 12/13/2021   Osteoporosis 12/13/2021   COVID 04/20/2021   Chest pain of uncertain etiology 048/18/5631  Allergies 12/22/2019   Dizziness 11/19/2019   Grief reaction 08/27/2019   Left shoulder pain 08/27/2019   Hyperlipidemia 07/14/2018   Asthma, mild intermittent 06/06/2018   Morbid obesity ([place] 01/09/2018   Vitamin D deficiency 06/11/2017   Preventative health care 06/11/2017   Hypocalcemia 02/25/2017   Bradycardia 02/25/2017   Hearing loss 01/18/2017   Hyperglycemia 03/05/2016   Asthma, mild  persistent 11/23/2015   CHF (congestive heart failure) ([place] 05/26/2015   Female bladder prolapse 05/26/2015   IBS (irritable bowel syndrome) 05/26/2015   History of chicken pox    Allergic urticaria 08/01/2014   Food allergy 08/01/2014   Tinnitus 03/14/2014   Osteoarthritis    Nonspecific abnormal electrocardiogram (ECG) (EKG)  Headache 10/21/2013   Chiari malformation    GERD 11/10/2010   Obstructive sleep apnea 08/28/2010   Essential hypertension 08/28/2010   Seasonal and perennial allergic rhinitis 08/28/2010   DIVERTICULOSIS, COLON 08/28/2010    PHYSICAL THERAPY DISCHARGE SUMMARY  Visits from Start of Care: 12   Patient agrees to discharge. Patient goals were partially met. Patient is being discharged due to being pleased with the current functional level.   Scot Jun, PTA 02/22/2022, 3:17 PM  [place]. [place], [place], [address] Phone: [phone]   Fax:  [phone]  Name: NYANNA HEIDEMAN MRN: [id number] Date of Birth: 1945/10/22

## 2022-03-08 ENCOUNTER — Ambulatory Visit (INDEPENDENT_AMBULATORY_CARE_PROVIDER_SITE_OTHER): Payer: Medicare HMO

## 2022-03-08 DIAGNOSIS — J309 Allergic rhinitis, unspecified: Secondary | ICD-10-CM | POA: Diagnosis not present

## 2022-03-16 ENCOUNTER — Ambulatory Visit (INDEPENDENT_AMBULATORY_CARE_PROVIDER_SITE_OTHER): Payer: Medicare HMO

## 2022-03-16 DIAGNOSIS — J309 Allergic rhinitis, unspecified: Secondary | ICD-10-CM

## 2022-03-23 ENCOUNTER — Ambulatory Visit (INDEPENDENT_AMBULATORY_CARE_PROVIDER_SITE_OTHER): Payer: Medicare HMO | Admitting: *Deleted

## 2022-03-23 DIAGNOSIS — J309 Allergic rhinitis, unspecified: Secondary | ICD-10-CM | POA: Diagnosis not present

## 2022-03-28 NOTE — Progress Notes (Signed)
Subjective:    Patient ID: Anne Barnes, female    DOB: 08-15-1946, 76 y.o.   MRN: [id number]  Chief Complaint  Patient presents with   Follow-up    HPI Patient is in today for a follow up on chronic medical concerns. No recent febrile illness or acute hospitalizations. No complaints of polyuria or polydipsia. She is struggling with grief after her daughter was diagnosed with ALS. She feels she is managing well with the support of family and friends. She has chosen not to start [place] because insurance would not cover and the cost is prohibitive. Denies CP/palp/SOB/HA/congestion/fevers/GI or GU c/o. Taking meds as prescribed   Past Medical History:  Diagnosis Date   Acute combined systolic and diastolic heart failure ([place]) 05/26/2015   ALLERGIC RHINITIS    Allergic urticaria 08/01/2014   Patient reports allergies to shrimp, egg whites, tomatoes, dairy    Allergy    seasonal and numerous food and drug allergies.   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   Office Spirometry 11/07/17-WNL-FVC 1.58/82%, FEV1 1.27/85%, ratio 0.80, FEF 25-75% 0.26/89%   Bradycardia 02/25/2017   Chiari malformation    Noted MRI brain 09/2013 - s/p neuro eval for same   DIVERTICULOSIS, COLON    Essential hypertension 08/28/2010   Qualifier: Diagnosis of  By: Marca Ancona RMA, Lucy      Frequent headaches    GERD    H/O measles    H/O mumps    Hearing loss 01/18/2017   History of chicken pox    Hyperglycemia 03/05/2016   HYPERTENSION    Hypocalcemia 02/25/2017   IBS (irritable bowel syndrome) 05/26/2015   Nonspecific abnormal electrocardiogram (ECG) (EKG)    OBSTRUCTIVE SLEEP APNEA 12/2008 dx   noncompliant with CPAP qhs   Obstructive sleep apnea 08/28/2010   NPSG 12/2008:  AHI 13/hr with desats to 78%    Preventative health care 06/11/2017   Sciatica    right side   Seasonal and perennial allergic rhinitis 08/28/2010   Allergy profile 03/11/14- positive especially for dust, cat and dog Food allergy profile- total IgE 86  with several food group elevations Sed rate-WNL      Sinusitis 06/06/2017   Sleep apnea    uses cpap   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   Vaginitis 01/18/2017   Vitamin D deficiency 06/11/2017    Past Surgical History:  Procedure Laterality Date   [place]   Breast biopsy   COLONOSCOPY  2011   Dr Collene Mares   UMBILICAL HERNIA REPAIR      Family History  Problem Relation Age of Onset   Arthritis Mother        died of complications from hip surgery   Arthritis Father    Kidney disease Father    Pneumonia Father    Heart disease Father    Lupus Daughter    Rheum arthritis Daughter    Sjogren's syndrome Daughter    Fibromyalgia Daughter    Diabetes Brother    Heart disease Brother    Heart attack Brother    Alcohol abuse Brother    Throat cancer Brother    Lung cancer Brother    Kidney disease Sister        dialysis   Obesity Sister    Hypertension Maternal Grandfather    Hypertension Sister    Kidney disease Sister        dialysis   Pneumonia Brother    Hypertension Daughter  Graves' disease Daughter    Hypertension Daughter    Alcohol abuse Other        parent   Ataxia Neg Hx    Chorea Neg Hx    Dementia Neg Hx    Mental retardation Neg Hx    Migraines Neg Hx    Multiple sclerosis Neg Hx    Neurofibromatosis Neg Hx    Neuropathy Neg Hx    Parkinsonism Neg Hx    Seizures Neg Hx    Stroke Neg Hx    Colon cancer Neg Hx    Colon polyps Neg Hx    Esophageal cancer Neg Hx    Rectal cancer Neg Hx    Stomach cancer Neg Hx     Social History   Socioeconomic History   Marital status: Widowed    Spouse name: Not on file   Number of children: Not on file   Years of education: Not on file   Highest education level: Not on file  Occupational History   Occupation: retired  Tobacco Use   Smoking status: Never   Smokeless tobacco: Never   Tobacco comments:    Married, lives with spouse, dtr 2 g-kids.  Vaping Use    Vaping Use: Never used  Substance and Sexual Activity   Alcohol use: No    Comment: rare   Drug use: No   Sexual activity: Not Currently    Comment: lives with husband, no dietary restrictions avoid foods allergic to, retired  Other Topics Concern   Not on file  Social History Narrative   Right handed   One story with daughter and two grandkids   Social Determinants of Health   Financial Resource Strain: Low Risk  (04/17/2021)   Overall Financial Resource Strain (CARDIA)    Difficulty of Paying Living Expenses: Not hard at all  Food Insecurity: No Food Insecurity (04/17/2021)   Hunger Vital Sign    Worried About Running Out of Food in the Last Year: Never true    [place] in the Last Year: Never true  Transportation Needs: No Transportation Needs (04/17/2021)   PRAPARE - Hydrologist (Medical): No    Lack of Transportation (Non-Medical): No  Physical Activity: Sufficiently Active (04/17/2021)   Exercise Vital Sign    Days of Exercise per Week: 7 days    Minutes of Exercise per Session: 40 min  Stress: No Stress Concern Present (04/17/2021)   [place]    Feeling of Stress : Not at all  Social Connections: Moderately Isolated (04/17/2021)   Social Connection and Isolation Panel [NHANES]    Frequency of Communication with Friends and Family: More than three times a week    Frequency of Social Gatherings with Friends and Family: More than three times a week    Attends Religious Services: More than 4 times per year    Active Member of [employer] or Organizations: No    Attends Archivist Meetings: Never    Marital Status: Widowed  Intimate Partner Violence: Not At Risk (04/17/2021)   Humiliation, Afraid, Rape, and Kick questionnaire    Fear of Current or Ex-Partner: No    Emotionally Abused: No    Physically Abused: No    Sexually Abused: No    Outpatient Medications Prior  to Visit  Medication Sig Dispense Refill   albuterol (VENTOLIN HFA) 108 (90 Base) MCG/ACT inhaler INHALE 2 PUFFS BY MOUTH EVERY  6 HOURS AS NEEDED FOR WHEEZING OR  SHORTNESS  OF  BREATH 18 g 3   alendronate (FOSAMAX) 70 MG tablet Take 1 tablet (70 mg total) by mouth once a week. Take with a full glass of water on an empty stomach. 4 tablet 5   ALPRAZolam (XANAX) 0.5 MG tablet TAKE 1 TABLET BY MOUTH TWICE DAILY AS NEEDED FOR ANXIETY AND FOR SLEEP 20 tablet 1   aspirin EC 81 MG tablet Take 81 mg by mouth daily. Swallow whole.     azelastine (ASTELIN) 0.1 % nasal spray Place 2 sprays into both nostrils 2 (two) times daily as needed for rhinitis. 30 mL 5   b complex vitamins tablet Take 1 tablet by mouth daily.     Cholecalciferol (VITAMIN D) 50 MCG (2000 UT) tablet Take 2,000 Units by mouth daily.     EPINEPHrine 0.3 mg/0.3 mL IJ SOAJ injection Inject 0.3 mg into the muscle as needed for anaphylaxis. 2 each 1   famotidine (PEPCID) 40 MG tablet Take 1 tablet (40 mg total) by mouth 2 (two) times daily. 180 tablet 1   fluticasone (FLONASE) 50 MCG/ACT nasal spray Place 2 sprays into both nostrils daily. 1 g 0   furosemide (LASIX) 20 MG tablet Take 1 tablet (20 mg total) by mouth 3 (three) times a week. Monday, Wednesday and Friday. 90 tablet 3   levocetirizine (XYZAL) 5 MG tablet TAKE 1 TABLET BY MOUTH ONCE DAILY IN THE EVENING 30 tablet 5   losartan (COZAAR) 100 MG tablet Take 1 tablet by mouth once daily 90 tablet 0   METAMUCIL FIBER PO Take by mouth. Take 1 spoon once daily     montelukast (SINGULAIR) 10 MG tablet Take 1 tablet (10 mg total) by mouth at bedtime. 90 tablet 1   Multiple Vitamin (MULTIVITAMIN) tablet Take 1 tablet by mouth daily.     olopatadine (PATANOL) 0.1 % ophthalmic solution Place 1 drop into both eyes 2 (two) times daily. 5 mL 5   omeprazole (PRILOSEC) 40 MG capsule Take 1 capsule (40 mg total) by mouth 2 (two) times daily. 90 capsule 3   rosuvastatin (CRESTOR) 10 MG tablet Take 1  tablet (10 mg total) by mouth daily. 90 tablet 2   spironolactone (ALDACTONE) 25 MG tablet Take 1 tablet (25 mg total) by mouth daily. 90 tablet 3   amLODipine (NORVASC) 2.5 MG tablet Take 1 tablet by mouth twice daily 180 tablet 3   Semaglutide-Weight Management (WEGOVY) 0.25 MG/0.5ML SOAJ Inject 0.25 mg into the skin once a week. (Patient not taking: Reported on 03/29/2022) 2 mL 0   No facility-administered medications prior to visit.    Allergies  Allergen Reactions   Codeine Nausea And Vomiting   Eggs Or Egg-Derived Products    Hydralazine Hcl Other (See Comments)    Pt reports causes dizziness   Lisinopril-Hydrochlorothiazide Hives    REACTION: lip swelling,facial swelling,rash.   Metoprolol Other (See Comments)    Does not feel well on it    Other Cough    Patient states she is allergic to Shrimp, egg whites, tomatoes and dairy products.  Says they make her feel unwell but do not cause hives, SOB or anaphylaxis.  She just avoids eating them because they can make her have a headache.  Strawberries, watermelon, peanuts, and all shellfish.     Shrimp [Shellfish Allergy] Other (See Comments)    Was allergy tested and showed shrimp was one--has not reacted b/c avoided   Tomato Other (  See Comments)    Review of Systems  Constitutional:  Positive for malaise/fatigue. Negative for fever.  HENT:  Negative for congestion.   Eyes:  Negative for blurred vision.  Respiratory:  Negative for shortness of breath.   Cardiovascular:  Negative for chest pain, palpitations and leg swelling.  Gastrointestinal:  Negative for abdominal pain, blood in stool and nausea.  Genitourinary:  Negative for dysuria and frequency.  Musculoskeletal:  Negative for falls.  Skin:  Negative for rash.  Neurological:  Negative for dizziness, loss of consciousness and headaches.  Endo/Heme/Allergies:  Negative for environmental allergies.  Psychiatric/Behavioral:  Positive for depression. The patient is  nervous/anxious.        Objective:    Physical Exam  BP 138/80   Pulse 70   Resp 20   Ht _0  (1.499 m)   Wt 235 lb (106.6 kg)   SpO2 99%   BMI 47.46 kg/m  Wt Readings from Last 3 Encounters:  03/29/22 235 lb (106.6 kg)  01/24/22 231 lb 3.2 oz (104.9 kg)  12/12/21 230 lb (104.3 kg)    Diabetic Foot Exam - Simple   No data filed    Lab Results  Component Value Date   WBC 9.5 03/29/2022   HGB 12.8 03/29/2022   HCT 39.2 03/29/2022   PLT 249.0 03/29/2022   GLUCOSE 81 03/29/2022   CHOL 117 03/29/2022   TRIG 44.0 03/29/2022   HDL 42.30 03/29/2022   LDLCALC 66 03/29/2022   ALT 25 03/29/2022   AST 19 03/29/2022   NA 140 03/29/2022   K 4.2 03/29/2022   CL 103 03/29/2022   CREATININE 0.90 03/29/2022   BUN 18 03/29/2022   CO2 30 03/29/2022   TSH 1.92 03/29/2022   INR 1.06 01/10/2015   HGBA1C 5.5 03/29/2022    Lab Results  Component Value Date   TSH 1.92 03/29/2022   Lab Results  Component Value Date   WBC 9.5 03/29/2022   HGB 12.8 03/29/2022   HCT 39.2 03/29/2022   MCV 91.4 03/29/2022   PLT 249.0 03/29/2022   Lab Results  Component Value Date   NA 140 03/29/2022   K 4.2 03/29/2022   CO2 30 03/29/2022   GLUCOSE 81 03/29/2022   BUN 18 03/29/2022   CREATININE 0.90 03/29/2022   BILITOT 0.5 03/29/2022   ALKPHOS 81 03/29/2022   AST 19 03/29/2022   ALT 25 03/29/2022   PROT 6.9 03/29/2022   ALBUMIN 4.1 03/29/2022   CALCIUM 9.3 03/29/2022   ANIONGAP 9 09/15/2017   EGFR 61 05/12/2021   GFR 62.96 03/29/2022   Lab Results  Component Value Date   CHOL 117 03/29/2022   Lab Results  Component Value Date   HDL 42.30 03/29/2022   Lab Results  Component Value Date   LDLCALC 66 03/29/2022   Lab Results  Component Value Date   TRIG 44.0 03/29/2022   Lab Results  Component Value Date   CHOLHDL 3 03/29/2022   Lab Results  Component Value Date   HGBA1C 5.5 03/29/2022       Assessment & Plan:      Problem List Items Addressed This Visit      Essential hypertension - Primary    Well controlled, no changes to meds. Encouraged heart healthy diet such as the DASH diet and exercise as tolerated.       Relevant Medications   amLODipine (NORVASC) 2.5 MG tablet   Other Relevant Orders   TSH (Completed)   Comprehensive metabolic  panel (Completed)   CBC (Completed)   CHF (congestive heart failure) (HCC)   Relevant Medications   amLODipine (NORVASC) 2.5 MG tablet   Hyperglycemia    hgba1c acceptable, minimize simple carbs. Increase exercise as tolerated.       Relevant Orders   Hemoglobin A1c (Completed)   Vitamin D deficiency    Supplement and monitor      Morbid obesity ([place])    Encouraged DASH or MIND diet, decrease po intake and increase exercise as tolerated. Needs 7-8 hours of sleep nightly. Avoid trans fats, eat small, frequent meals every 4-5 hours with lean proteins, complex carbs and healthy fats. Minimize simple carbs, high fat foods and processed foods, her insurance has refused to pay for the meds so she is going to avoid the meds for now due to cost      Hyperlipidemia    Tolerating statin, encouraged heart healthy diet, avoid trans fats, minimize simple carbs and saturated fats. Increase exercise as tolerated. Is tolerating Rosuvastatin      Relevant Medications   amLODipine (NORVASC) 2.5 MG tablet   Other Relevant Orders   Lipid panel (Completed)   Grief reaction    Unfortunately one of her daughters has just been diagnosed with ALS so she is still struggling with grief at the diagnosis. She does not feel she needs meds or counseling at this time but she will let us know if she changes her mind      Allergies    Is getting allergy shots now and she is not tolerating them well, she is weak, shakey with increased mucus. They are going to adjust her dosing.       Osteoporosis   Relevant Orders   VITAMIN D 25 Hydroxy (Vit-D Deficiency, Fractures) (Completed)    I have discontinued Jeanie Cooks. [place]'s  GQBVQX. I am also having her maintain her multivitamin, b complex vitamins, spironolactone, aspirin EC, furosemide, olopatadine, famotidine, EPINEPHrine, montelukast, azelastine, METAMUCIL FIBER PO, Vitamin D, omeprazole, fluticasone, rosuvastatin, alendronate, levocetirizine, losartan, albuterol, ALPRAZolam, and amLODipine.  Meds ordered this encounter  Medications   DISCONTD: amLODipine (NORVASC) 2.5 MG tablet    Sig: Take 1 tablet (2.5 mg total) by mouth 2 (two) times daily.    Dispense:  60 tablet    Refill:  2   amLODipine (NORVASC) 2.5 MG tablet    Sig: Take 1 tablet (2.5 mg total) by mouth 2 (two) times daily.    Dispense:  60 tablet    Refill:  2

## 2022-03-29 ENCOUNTER — Ambulatory Visit (INDEPENDENT_AMBULATORY_CARE_PROVIDER_SITE_OTHER): Payer: Medicare HMO | Admitting: Family Medicine

## 2022-03-29 ENCOUNTER — Encounter: Payer: Self-pay | Admitting: Family Medicine

## 2022-03-29 ENCOUNTER — Other Ambulatory Visit (HOSPITAL_BASED_OUTPATIENT_CLINIC_OR_DEPARTMENT_OTHER): Payer: Self-pay

## 2022-03-29 VITALS — BP 138/80 | HR 70 | Resp 20 | Ht 59.0 in | Wt 235.0 lb

## 2022-03-29 DIAGNOSIS — R739 Hyperglycemia, unspecified: Secondary | ICD-10-CM

## 2022-03-29 DIAGNOSIS — T7840XD Allergy, unspecified, subsequent encounter: Secondary | ICD-10-CM | POA: Diagnosis not present

## 2022-03-29 DIAGNOSIS — I509 Heart failure, unspecified: Secondary | ICD-10-CM | POA: Diagnosis not present

## 2022-03-29 DIAGNOSIS — E559 Vitamin D deficiency, unspecified: Secondary | ICD-10-CM

## 2022-03-29 DIAGNOSIS — E7800 Pure hypercholesterolemia, unspecified: Secondary | ICD-10-CM

## 2022-03-29 DIAGNOSIS — I10 Essential (primary) hypertension: Secondary | ICD-10-CM

## 2022-03-29 DIAGNOSIS — M810 Age-related osteoporosis without current pathological fracture: Secondary | ICD-10-CM | POA: Diagnosis not present

## 2022-03-29 DIAGNOSIS — F4321 Adjustment disorder with depressed mood: Secondary | ICD-10-CM

## 2022-03-29 LAB — CBC
HCT: 39.2 % (ref 36.0–46.0)
Hemoglobin: 12.8 g/dL (ref 12.0–15.0)
MCHC: 32.7 g/dL (ref 30.0–36.0)
MCV: 91.4 fl (ref 78.0–100.0)
Platelets: 249 10*3/uL (ref 150.0–400.0)
RBC: 4.29 Mil/uL (ref 3.87–5.11)
RDW: 13.9 % (ref 11.5–15.5)
WBC: 9.5 10*3/uL (ref 4.0–10.5)

## 2022-03-29 LAB — COMPREHENSIVE METABOLIC PANEL
ALT: 25 U/L (ref 0–35)
AST: 19 U/L (ref 0–37)
Albumin: 4.1 g/dL (ref 3.5–5.2)
Alkaline Phosphatase: 81 U/L (ref 39–117)
BUN: 18 mg/dL (ref 6–23)
CO2: 30 mEq/L (ref 19–32)
Calcium: 9.3 mg/dL (ref 8.4–10.5)
Chloride: 103 mEq/L (ref 96–112)
Creatinine, Ser: 0.9 mg/dL (ref 0.40–1.20)
GFR: 62.96 mL/min (ref >60.00)
Glucose, Bld: 81 mg/dL (ref 70–99)
Potassium: 4.2 mEq/L (ref 3.5–5.1)
Sodium: 140 mEq/L (ref 135–145)
Total Bilirubin: 0.5 mg/dL (ref 0.2–1.2)
Total Protein: 6.9 g/dL (ref 6.0–8.3)

## 2022-03-29 LAB — VITAMIN D 25 HYDROXY (VIT D DEFICIENCY, FRACTURES): VITD: 44.6 ng/mL (ref 30.00–100.00)

## 2022-03-29 LAB — LIPID PANEL
Cholesterol: 117 mg/dL (ref 0–200)
HDL: 42.3 mg/dL (ref >39.00)
LDL Cholesterol: 66 mg/dL (ref 0–99)
NonHDL: 75.03
Total CHOL/HDL Ratio: 3
Triglycerides: 44 mg/dL (ref 0.0–149.0)
VLDL: 8.8 mg/dL (ref 0.0–40.0)

## 2022-03-29 LAB — TSH: TSH: 1.92 u[IU]/mL (ref 0.35–5.50)

## 2022-03-29 MED ORDER — AMLODIPINE BESYLATE 2.5 MG PO TABS
2.5000 mg | ORAL_TABLET | Freq: Two times a day (BID) | ORAL | 2 refills | Status: DC
  Filled 2022-03-29 (×2): qty 60, 30d supply, fill #0
  Filled 2022-05-04: qty 60, 30d supply, fill #1

## 2022-03-29 MED ORDER — AMLODIPINE BESYLATE 2.5 MG PO TABS: 2.5000 mg | ORAL_TABLET | Freq: Two times a day (BID) | ORAL | 2 refills | Status: DC

## 2022-03-29 NOTE — Assessment & Plan Note (Addendum)
Encouraged DASH or MIND diet, decrease po intake and increase exercise as tolerated. Needs 7-8 hours of sleep nightly. Avoid trans fats, eat small, frequent meals every 4-5 hours with lean proteins, complex carbs and healthy fats. Minimize simple carbs, high fat foods and processed foods, her insurance has refused to pay for the meds so she is going to avoid the meds for now due to cost

## 2022-03-29 NOTE — Patient Instructions (Signed)
Allergies, Adult An allergy is a condition in which the body's defense system (immune system) comes in contact with an allergen and reacts to it. An allergen is anything that causes an allergic reaction. Allergens cause the immune system to make proteins for fighting infections (antibodies). These antibodies cause cells to release chemicals called histamines that set off the symptoms of an allergic reaction. Allergies often affect the nasal passages (allergic rhinitis), eyes (allergic conjunctivitis), skin (atopic dermatitis), and stomach. Allergies can be mild, moderate, or severe. They cannot spread from person to person. Allergies can develop at any age and may be outgrown. What are the causes? This condition is caused by allergens. Common allergens include: Outdoor allergens, such as pollen, car fumes, and mold. Indoor allergens, such as dust, smoke, mold, and pet dander. Other allergens, such as foods, medicines, scents, insect bites or stings, and other skin irritants. What increases the risk? You are more likely to develop this condition if you have: Family members with allergies. Family members who have any condition that may be caused by allergens, such as asthma. This may make you more likely to have other allergies. What are the signs or symptoms? Symptoms of this condition depend on the severity of the allergy. Mild to moderate symptoms Runny nose, stuffy nose (nasal congestion), or sneezing. Itchy mouth, ears, or throat. A feeling of mucus dripping down the back of your throat (postnasal drip). Sore throat. Itchy, red, watery, or puffy eyes. Skin rash, or itchy, red, swollen areas of skin (hives). Stomach cramps or bloating. Severe symptoms Severe allergies to food, medicine, or insect bites may cause anaphylaxis, which can be life-threatening. Symptoms include: A red (flushed) face. Wheezing or coughing. Swollen lips, tongue, or mouth. Tight or swollen throat. Chest pain or  tightness, or rapid heartbeat. Trouble breathing or shortness of breath. Pain in the abdomen, vomiting, or diarrhea. Dizziness or fainting. How is this diagnosed? This condition is diagnosed based on your symptoms, your family and medical history, and a physical exam. You may also have tests, including: Skin tests to see how your skin reacts to allergens that may be causing your symptoms. Tests include: Skin prick test. For this test, an allergen is introduced to your body through a small opening in the skin. Intradermal skin test. For this test, a small amount of allergen is injected under the first layer of your skin. Patch test. For this test, a small amount of allergen is placed on your skin. The area is covered and then checked after a few days. Blood tests. A challenge test. For this test, you will eat or breathe in a small amount of allergen to see if you have an allergic reaction. You may also be asked to: Keep a food diary. This is a record of all the foods, drinks, and symptoms you have in a day. Try an elimination diet. To do this: Remove certain foods from your diet. Add those foods back one by one to find out if any foods cause an allergic reaction. How is this treated?     Treatment for allergies depends on your symptoms. Treatment may include: Cold, wet cloths (cold compresses) to soothe itching and swelling. Eye drops or nasal sprays. Nasal irrigation to help clear your mucus or keep the nasal passages moist. A humidifier to add moisture to the air. Skin creams to treat rashes or itching. Oral antihistamines or other medicines to block the reaction or to treat inflammation. Diet changes to remove foods that cause allergies. Being   exposed again and again to tiny amounts of allergens to help you build a defense against it (tolerance). This is called immunotherapy. Examples include: Allergy shot. You receive an injection that contains an allergen. Sublingual immunotherapy.  You take a small dose of allergen under your tongue. Emergency injection for anaphylaxis. You give yourself a shot using a syringe (auto-injector) that contains the amount of medicine you need. Your health care provider will teach you how to give yourself an injection. Follow these instructions at home: Medicines  Take or apply over-the-counter and prescription medicines only as told by your health care provider. Always carry your auto-injector pen if you are at risk of anaphylaxis. Give yourself an injection as told by your health care provider. Eating and drinking Follow instructions from your health care provider about eating or drinking restrictions. Drink enough fluid to keep your urine pale yellow. General instructions Wear a medical alert bracelet or necklace to let others know that you have had anaphylaxis before. Avoid known allergens whenever possible. Keep all follow-up visits as told by your health care provider. This is important. Contact a health care provider if: Your symptoms do not get better with treatment. Get help right away if: You have symptoms of anaphylaxis. These include: Swollen mouth, tongue, or throat. Pain or tightness in your chest. Trouble breathing or shortness of breath. Dizziness or fainting. Severe abdominal pain, vomiting, or diarrhea. These symptoms may represent a serious problem that is an emergency. Do not wait to see if the symptoms will go away. Get medical help right away. Call your local emergency services (911 in the [place] Do not drive yourself to the hospital. Summary Take or apply over-the-counter and prescription medicines only as told by your health care provider. Avoid known allergens when possible. Always carry your auto-injector pen if you are at risk of anaphylaxis. Give yourself an injection as told by your health care provider. Wear a medical alert bracelet or necklace to let others know that you have had anaphylaxis  before. Anaphylaxis is a life-threatening emergency. Get help right away. This information is not intended to replace advice given to you by your health care provider. Make sure you discuss any questions you have with your health care provider. Document Revised: 05/09/2020 Document Reviewed: 07/22/2019 Elsevier Patient Education  2023 [employer]  

## 2022-03-29 NOTE — Assessment & Plan Note (Signed)
Is getting allergy shots now and she is not tolerating them well, she is weak, shakey with increased mucus. They are going to adjust her dosing.

## 2022-03-30 LAB — HEMOGLOBIN A1C: Hgb A1c MFr Bld: 5.5 % (ref 4.6–6.5)

## 2022-03-30 NOTE — Assessment & Plan Note (Signed)
Well controlled, no changes to meds. Encouraged heart healthy diet such as the DASH diet and exercise as tolerated.  °

## 2022-03-30 NOTE — Assessment & Plan Note (Signed)
Unfortunately one of her daughters has just been diagnosed with ALS so she is still struggling with grief at the diagnosis. She does not feel she needs meds or counseling at this time but she will let us know if she changes her mind

## 2022-03-30 NOTE — Assessment & Plan Note (Signed)
hgba1c acceptable, minimize simple carbs. Increase exercise as tolerated.  

## 2022-03-30 NOTE — Assessment & Plan Note (Signed)
Tolerating statin, encouraged heart healthy diet, avoid trans fats, minimize simple carbs and saturated fats. Increase exercise as tolerated. Is tolerating Rosuvastatin 

## 2022-03-30 NOTE — Assessment & Plan Note (Signed)
Supplement and monitor 

## 2022-04-03 ENCOUNTER — Ambulatory Visit (INDEPENDENT_AMBULATORY_CARE_PROVIDER_SITE_OTHER): Payer: Medicare HMO

## 2022-04-03 DIAGNOSIS — J309 Allergic rhinitis, unspecified: Secondary | ICD-10-CM | POA: Diagnosis not present

## 2022-04-13 ENCOUNTER — Ambulatory Visit (INDEPENDENT_AMBULATORY_CARE_PROVIDER_SITE_OTHER): Payer: Medicare HMO

## 2022-04-13 DIAGNOSIS — J309 Allergic rhinitis, unspecified: Secondary | ICD-10-CM

## 2022-04-19 ENCOUNTER — Ambulatory Visit: Payer: Medicare HMO

## 2022-04-20 ENCOUNTER — Ambulatory Visit (INDEPENDENT_AMBULATORY_CARE_PROVIDER_SITE_OTHER): Payer: Medicare HMO | Admitting: *Deleted

## 2022-04-20 DIAGNOSIS — J309 Allergic rhinitis, unspecified: Secondary | ICD-10-CM | POA: Diagnosis not present

## 2022-04-27 ENCOUNTER — Ambulatory Visit: Payer: Medicare HMO | Admitting: Gastroenterology

## 2022-04-27 ENCOUNTER — Encounter: Payer: Self-pay | Admitting: Gastroenterology

## 2022-04-27 VITALS — BP 146/70 | HR 84 | Ht 58.5 in | Wt 233.4 lb

## 2022-04-27 DIAGNOSIS — K219 Gastro-esophageal reflux disease without esophagitis: Secondary | ICD-10-CM | POA: Diagnosis not present

## 2022-04-27 DIAGNOSIS — R053 Chronic cough: Secondary | ICD-10-CM | POA: Diagnosis not present

## 2022-04-27 DIAGNOSIS — Z6841 Body Mass Index (BMI) 40.0 and over, adult: Secondary | ICD-10-CM

## 2022-04-27 DIAGNOSIS — R1012 Left upper quadrant pain: Secondary | ICD-10-CM

## 2022-04-27 DIAGNOSIS — R1013 Epigastric pain: Secondary | ICD-10-CM

## 2022-04-27 DIAGNOSIS — G935 Compression of brain: Secondary | ICD-10-CM | POA: Diagnosis not present

## 2022-04-27 DIAGNOSIS — I5032 Chronic diastolic (congestive) heart failure: Secondary | ICD-10-CM

## 2022-04-27 NOTE — Progress Notes (Signed)
Chief Complaint:    GERD, LUQ pain, dyspepsia  GI History: 76 y.o. female with a history of allergic rhinitis, mild asthma, OSA, CHF (TTE 03/2021 with EF 60-65%), nonobstructive CAD on coronary CTA, HTN, obesity (BMI 47.5), bradycardia, Chiari malformation, follows in the GI clinic for the following:  1) GERD: Reflux symptoms starting in late 2022.  Index symptoms of heartburn, intermittent chest pain lasting up to a few hours.  Can have associated dyspepsia.  Increased belching.  No dysphagia.  No nocturnal symptoms. - 09/21/2021: Evaluated in Allergy clinic.  Started omeprazole 20 mg every morning and Pepcid 40 mg bid - 10/25/2021: Evaluated Cardiology clinic and diagnosed with NCCP,  suspected 2/2 uncontrolled reflux. -10/31/2021: Initial evaluation in GI clinic.  Increased Prilosec to 40 mg bid x4 weeks with plan to titrate to lowest effective dose.  Recommended EGD at [facility];  Not yet scheduled  2) Longstanding history of IBS-C with associated abdominal bloating. - 10/31/2021: Initial evaluation in GI clinic.  Constipation well controlled, but increased bloating   Endoscopic History: - Colonoscopy (09/2009, Dr. Collene Mares): Redundant colon.  Cecum not fully intubated.  Otherwise normal.  Recommended VC - Colonoscopy (11/2020, Dr. Bryan Lemma): 8 mm transverse polyp (path: TA).  Redundant colon with excessive looping in the ascending colon.  Able to intubate cecum, but 50% of cecal surface could not be fully seen.  Narrow rectal vault.  Recommended VC - Virtual Colonoscopy (12/2020): Normal colon.  Repeat CRC screening in 7 years  HPI:     Patient is a 76 y.o. female presenting to the Gastroenterology Clinic for follow-up.  Was initially seen by me on 10/31/2021 as above.  Had planned on EGD at [facility], but she has not yet scheduled this procedure.  Main issue today is dyspepsia, LUQ pain, and increased mucus production.  She thinks increased mucus has been over the last 2 years or so.  Seems to only  occur on left side of neck. Has a burning sensation during eating, particularly with fruits and liquids.  Can have associated cough.  Symptoms occur during meal/OP swallow phase.  Improvement in reflux sxs since increasing Prilosec to 40 mg BID.   Intermittent dyspepsia/LUQ pain.  Perhaps better since starting high-dose PPI.  Otherwise very rare breakthrough heartburn and regurgitation, possibly 1 every 3-4 weeks.  No new abdominal imaging for review.     Latest Ref Rng & Units 03/29/2022   12:05 PM 07/31/2021   12:28 PM 04/20/2021   10:52 AM  CBC  WBC 4.0 - 10.5 K/uL 9.5  8.3  7.4   Hemoglobin 12.0 - 15.0 g/dL 12.8  12.5  12.5   Hematocrit 36.0 - 46.0 % 39.2  38.4  38.3   Platelets 150.0 - 400.0 K/uL 249.0  229.0  231.0       Latest Ref Rng & Units 03/29/2022   12:05 PM 07/31/2021   12:28 PM 05/12/2021   11:35 AM  CMP  Glucose 70 - 99 mg/dL 81  78  93   BUN 6 - 23 mg/dL '18  18  18   '$ Creatinine 0.40 - 1.20 mg/dL 0.90  0.80  0.98   Sodium 135 - 145 mEq/L 140  142  142   Potassium 3.5 - 5.1 mEq/L 4.2  4.1  4.4   Chloride 96 - 112 mEq/L 103  105  105   CO2 19 - 32 mEq/L '30  30  24   '$ Calcium 8.4 - 10.5 mg/dL 9.3  8.8  9.5  Total Protein 6.0 - 8.3 g/dL 6.9  6.9    Total Bilirubin 0.2 - 1.2 mg/dL 0.5  0.5    Alkaline Phos 39 - 117 U/L 81  87    AST 0 - 37 U/L 19  20    ALT 0 - 35 U/L 25  30      TSH normal.    Review of systems:     No chest pain, no SOB, no fevers, no urinary sx   Past Medical History:  Diagnosis Date   Acute combined systolic and diastolic heart failure ([place]) 05/26/2015   ALLERGIC RHINITIS    Allergic urticaria 08/01/2014   Patient reports allergies to shrimp, egg whites, tomatoes, dairy    Allergy    seasonal and numerous food and drug allergies.   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   Office Spirometry 11/07/17-WNL-FVC 1.58/82%, FEV1 1.27/85%, ratio 0.80, FEF 25-75% 0.26/89%   Bradycardia 02/25/2017   Chiari malformation    Noted MRI brain 09/2013 - s/p neuro  eval for same   DIVERTICULOSIS, COLON    Essential hypertension 08/28/2010   Qualifier: Diagnosis of  By: Marca Ancona RMA, Lucy      Frequent headaches    GERD    H/O measles    H/O mumps    Hearing loss 01/18/2017   History of chicken pox    Hyperglycemia 03/05/2016   HYPERTENSION    Hypocalcemia 02/25/2017   IBS (irritable bowel syndrome) 05/26/2015   Nonspecific abnormal electrocardiogram (ECG) (EKG)    OBSTRUCTIVE SLEEP APNEA 12/2008 dx   noncompliant with CPAP qhs   Obstructive sleep apnea 08/28/2010   NPSG 12/2008:  AHI 13/hr with desats to 78%    Preventative health care 06/11/2017   Sciatica    right side   Seasonal and perennial allergic rhinitis 08/28/2010   Allergy profile 03/11/14- positive especially for dust, cat and dog Food allergy profile- total IgE 86 with several food group elevations Sed rate-WNL      Sinusitis 06/06/2017   Sleep apnea    uses cpap   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 Take with a full glass of water on an empty stomach. 4 tablet 5   ALPRAZolam (XANAX) 0.5 MG tablet TAKE 1 TABLET BY MOUTH TWICE DAILY AS NEEDED FOR ANXIETY AND FOR SLEEP 20 tablet 1   amLODipine (NORVASC) 2.5 MG tablet Take 1 tablet (2.5 mg total) by mouth 2 (two) times daily. 60 tablet 2   aspirin EC 81 MG tablet Take 81 mg by mouth daily. Swallow whole.     azelastine (ASTELIN) 0.1 % nasal spray Place 2 sprays into both nostrils 2 (two) times daily as needed for rhinitis. 30 mL 5   b complex vitamins tablet Take 1 tablet  by mouth daily.     Cholecalciferol (VITAMIN D) 50 MCG (2000 UT) tablet Take 2,000 Units by mouth daily.     famotidine (PEPCID) 40 MG tablet Take 1 tablet (40 mg total) by mouth 2 (  two) times daily. 180 tablet 1   fluticasone (FLONASE) 50 MCG/ACT nasal spray Place 2 sprays into both nostrils daily. 1 g 0   furosemide (LASIX) 20 MG tablet Take 1 tablet (20 mg total) by mouth 3 (three) times a week. Monday, Wednesday and Friday. 90 tablet 3   levocetirizine (XYZAL) 5 MG tablet TAKE 1 TABLET BY MOUTH ONCE DAILY IN THE EVENING 30 tablet 5   losartan (COZAAR) 100 MG tablet Take 1 tablet by mouth once daily 90 tablet 0   METAMUCIL FIBER PO Take by mouth. Take 1 spoon once daily     montelukast (SINGULAIR) 10 MG tablet Take 1 tablet (10 mg total) by mouth at bedtime. 90 tablet 1   Multiple Vitamin (MULTIVITAMIN) tablet Take 1 tablet by mouth daily.     olopatadine (PATANOL) 0.1 % ophthalmic solution Place 1 drop into both eyes 2 (two) times daily. 5 mL 5   omeprazole (PRILOSEC) 40 MG capsule Take 1 capsule (40 mg total) by mouth 2 (two) times daily. 90 capsule 3   rosuvastatin (CRESTOR) 10 MG tablet Take 1 tablet (10 mg total) by mouth daily. 90 tablet 2   spironolactone (ALDACTONE) 25 MG tablet Take 1 tablet (25 mg total) by mouth daily. 90 tablet 3   EPINEPHrine 0.3 mg/0.3 mL IJ SOAJ injection Inject 0.3 mg into the muscle as needed for anaphylaxis. (Patient not taking: Reported on 04/27/2022) 2 each 1   No current facility-administered medications for this visit.    Physical Exam:     BP (!) 146/70 (BP Location: Left Arm, Patient Position: Sitting, Cuff Size: Large)   Pulse 84   Ht 4' 10.5" (1.486 m) Comment: height measured without shoes  Wt 233 lb 6 oz (105.9 kg)   BMI 47.95 kg/m   GENERAL:  Pleasant female in NAD Musculoskeletal:  Normal muscle tone, normal strength NEURO: Alert and oriented x 3, no focal neurologic deficits   IMPRESSION and PLAN:    1) GERD 2) Dyspepsia 3) LUQ  pain  - EGD to evaluate for medical/luminal pathology of upper abdominal symptoms along with evaluation for erosive esophagitis, hiatal hernia, LES laxity - Continue antireflux lifestyle/dietary modifications - Continue high-dose Prilosec - Patient took her last dose of Pepcid today.  Discussed continuing dual acid suppression therapy vs PPI monotherapy.  Will trial off Pepcid and evaluate for clinical stability on monotherapy.  If reflux symptoms worsen off H2RA, she will call me and me will restart Pepcid  4) Increased Mucus production 5) Cough - Not entirely sure if this is truly an extra esophageal manifestation of reflux vs separate pathology - Evaluate for mucosal/luminal pathology time EGD as above - Modified barium swallow -If above work-up unrevealing and symptoms persist, plan for ENT eval +/- EM with pH/Mii on PPI to eval for breakthrough  6) Chronic diastolic heart failure 7) Obesity 8) History of Chiari 1 malformation 9) Nonobstructive CAD - Procedure to be scheduled at [facility] due to elevated periprocedural risks  The indications, risks, and benefits of EGD were explained to the patient in detail. Risks include but are not limited to bleeding, perforation, adverse reaction to medications, and cardiopulmonary compromise. Sequelae include but are not limited to the possibility of surgery, hospitalization, and mortality. The patient verbalized understanding and wished to proceed. All questions answered, referred to scheduler. Further recommendations pending results of the exam.            Dominic Pea Mikel Hardgrove ,DO, FACG 04/27/2022, 11:08 AM

## 2022-04-27 NOTE — Patient Instructions (Addendum)
_______________________________________________________  If you are age 75 or older, your body mass index should be between 23-30. Your Body mass index is 47.95 kg/m. If this is out of the aforementioned range listed, please consider follow up with your Primary Care Provider.  If you are age 79 or younger, your body mass index should be between 19-25. Your Body mass index is 47.95 kg/m. If this is out of the aformentioned range listed, please consider follow up with your Primary Care Provider.   You have been scheduled for an endoscopy. Please follow written instructions given to you at your visit today. If you use inhalers (even only as needed), please bring them with you on the day of your procedure.  The [facility] providers would like to encourage you to use [facility] to communicate with providers for non-urgent requests or questions.  Due to long hold times on the telephone, sending your provider a message by [facility] may be a faster and more efficient way to get a response.  Please allow 48 business hours for a response.  Please remember that this is for non-urgent requests.   It was a pleasure to see you today!  Thank you for trusting me with your gastrointestinal care!

## 2022-05-01 ENCOUNTER — Telehealth (HOSPITAL_COMMUNITY): Payer: Self-pay

## 2022-05-01 NOTE — Telephone Encounter (Signed)
Attempted to contact patient to schedule Modified Barium Swallow - vm is not set up.

## 2022-05-04 ENCOUNTER — Other Ambulatory Visit (HOSPITAL_BASED_OUTPATIENT_CLINIC_OR_DEPARTMENT_OTHER): Payer: Self-pay

## 2022-05-08 ENCOUNTER — Other Ambulatory Visit (HOSPITAL_COMMUNITY): Payer: Self-pay

## 2022-05-08 ENCOUNTER — Ambulatory Visit: Payer: Medicare HMO | Admitting: Podiatry

## 2022-05-08 DIAGNOSIS — M79674 Pain in right toe(s): Secondary | ICD-10-CM

## 2022-05-08 DIAGNOSIS — M79675 Pain in left toe(s): Secondary | ICD-10-CM

## 2022-05-08 DIAGNOSIS — R1310 Dysphagia, unspecified: Secondary | ICD-10-CM

## 2022-05-08 DIAGNOSIS — B351 Tinea unguium: Secondary | ICD-10-CM

## 2022-05-09 ENCOUNTER — Telehealth: Payer: Self-pay | Admitting: Internal Medicine

## 2022-05-09 NOTE — Telephone Encounter (Signed)
Flonase was never ordered by Dr Annamaria Boots. And she was last seen in December 2022. I left message that she needs to call and make appointment with Dr Annamaria Boots or App since its been almost a year. Nothing further needed

## 2022-05-10 ENCOUNTER — Ambulatory Visit (INDEPENDENT_AMBULATORY_CARE_PROVIDER_SITE_OTHER): Payer: Medicare HMO | Admitting: *Deleted

## 2022-05-10 DIAGNOSIS — J309 Allergic rhinitis, unspecified: Secondary | ICD-10-CM | POA: Diagnosis not present

## 2022-05-11 ENCOUNTER — Other Ambulatory Visit: Payer: Self-pay

## 2022-05-11 ENCOUNTER — Other Ambulatory Visit: Payer: Self-pay | Admitting: Family Medicine

## 2022-05-11 MED ORDER — SPIRONOLACTONE 25 MG PO TABS: 25.0000 mg | ORAL_TABLET | Freq: Every day | ORAL | 2 refills | Status: DC

## 2022-05-14 NOTE — Progress Notes (Signed)
Subjective: 76 y.o. returns the office today for painful, elongated, thickened toenails which she cannot trim herself. Denies any redness or drainage around the nails.  Denies any open lesions or any new concerns.    PCP: Mosie Lukes, MD-last seen March 29, 2022 A1c: 5.5 on March 29, 2022  Objective: AAO 3, NAD DP/PT pulses palpable, CRT less than 3 seconds Protective sensation intact with Derrel Nip monofilament Nails hypertrophic, dystrophic, elongated, brittle, discolored 10. There is tenderness overlying the nails 1-5 bilaterally. There is no surrounding erythema or drainage along the nail sites. No open lesions or pre-ulcerative lesions are identified. No pain with calf compression, swelling, warmth, erythema.  Assessment: Patient presents with symptomatic onychomycosis; and, capsulitis  Plan: -Treatment options including alternatives, risks, complications were discussed -Nails sharply debrided 10 without complication/bleeding. -Continue supportive shoe gear.  -Discussed daily foot inspection. If there are any changes, to call the office immediately.  -Follow-up in 3 months or sooner if any problems are to arise. In the meantime, encouraged to call the office with any questions, concerns, changes symptoms.  Celesta Gentile, DPM

## 2022-05-16 ENCOUNTER — Telehealth: Payer: Self-pay

## 2022-05-16 ENCOUNTER — Ambulatory Visit (HOSPITAL_COMMUNITY)
Admission: RE | Admit: 2022-05-16 | Discharge: 2022-05-16 | Disposition: A | Discharge status: Home / Self Care | Payer: Medicare HMO | Source: Ambulatory Visit | Attending: Family Medicine | Admitting: Family Medicine

## 2022-05-16 DIAGNOSIS — J45909 Unspecified asthma, uncomplicated: Secondary | ICD-10-CM | POA: Insufficient documentation

## 2022-05-16 DIAGNOSIS — R053 Chronic cough: Secondary | ICD-10-CM | POA: Insufficient documentation

## 2022-05-16 DIAGNOSIS — I110 Hypertensive heart disease with heart failure: Secondary | ICD-10-CM | POA: Diagnosis not present

## 2022-05-16 DIAGNOSIS — G4733 Obstructive sleep apnea (adult) (pediatric): Secondary | ICD-10-CM | POA: Insufficient documentation

## 2022-05-16 DIAGNOSIS — E669 Obesity, unspecified: Secondary | ICD-10-CM | POA: Insufficient documentation

## 2022-05-16 DIAGNOSIS — K219 Gastro-esophageal reflux disease without esophagitis: Secondary | ICD-10-CM | POA: Insufficient documentation

## 2022-05-16 DIAGNOSIS — R1310 Dysphagia, unspecified: Secondary | ICD-10-CM | POA: Diagnosis not present

## 2022-05-16 DIAGNOSIS — I2510 Atherosclerotic heart disease of native coronary artery without angina pectoris: Secondary | ICD-10-CM | POA: Insufficient documentation

## 2022-05-16 NOTE — Therapy (Signed)
Modified Barium Swallow Progress Note  Patient Details  Name: Anne Barnes MRN: [id number] Date of Birth: 03-29-1946  Today's Date: 05/16/2022  Modified Barium Swallow completed.  Full report located under Chart Review in the Imaging Section.  Brief recommendations include the following:  Clinical Impression  [name] presents with a normal oral phase of swallow but a moderately impaired pharyngeal phase of swallow. During pharyngeal phase she exhibited swallow initiation delay with thin liquids and at times with nectar thick liquids. She exhibited consistent aspiration during the swallow with cup sips of thin liquids and straw sips of nectar thick liquids. Aspiration occured without patient sensing until after aspirate had transited past vocal cords. She did then sense aspiration with a cough but this did not aid in expelling aspirate. When liquids were transiting, portion of liquid bolus moves under epiglottis before it comes into full contact to protect airway. She did not exhibit any aspiration when taking small sips of nectar thick liquids. No significant difficulties with puree, regular, 31m barium tablet or honey thick liquids. Aspiration appears to be impacted by reduced epiglottic deflection as pharyngeal space is more narrow (?pharyngeal edema) and cannot r/o impact from appearance of prominent cricopharyngeal bar.  When liquids transiting, portion of liquid bolus moves under epiglottis before it comes into full contact to protect airway. SLP is recommending nectar thick liquids and continue with solids as she has been. [name] given a sample box of Simply Thick food thickener (nectar consistency). SLP recommending OP SLP for dysphagia and patient's PCP contacted via secure EPIC message.   Swallow Evaluation Recommendations       SLP Diet Recommendations: Regular solids;Nectar thick liquid   Liquid Administration via: No straw;Cup   Medication Administration: Whole meds  with liquid           Postural Changes: Seated upright at 90 degrees           JSonia Baller MA, CCC-SLP Speech Therapy

## 2022-05-16 NOTE — Telephone Encounter (Signed)
Done

## 2022-05-16 NOTE — Addendum Note (Signed)
Encounter addended by: Sonia Baller, CCC-SLP on: 05/16/2022 2:45 PM  Actions taken: Order list changed

## 2022-05-17 ENCOUNTER — Other Ambulatory Visit: Payer: Self-pay | Admitting: Internal Medicine

## 2022-05-18 ENCOUNTER — Other Ambulatory Visit: Payer: Self-pay

## 2022-05-18 DIAGNOSIS — R1310 Dysphagia, unspecified: Secondary | ICD-10-CM

## 2022-05-22 ENCOUNTER — Ambulatory Visit: Payer: Medicare HMO | Attending: Family Medicine

## 2022-05-22 DIAGNOSIS — R1313 Dysphagia, pharyngeal phase: Secondary | ICD-10-CM | POA: Diagnosis not present

## 2022-05-22 NOTE — Patient Instructions (Addendum)
SWALLOWING EXERCISES Do these for 8 weeks   Effortful Swallows - Press your tongue against the roof of your mouth for 3 seconds, then squeeze the muscles in your neck while you swallow your saliva or a sip of water - Repeat 10-15 times, 2 times a day, and use whenever you eat or drink  [employer] - "half swallow" exercise - Start to swallow, and keep your Adam's apple up by squeezing hard with the muscles of the throat - Hold the squeeze for 5-7 seconds and then relax - Repeat 10-15 times, 2 times a day *use a wet spoon if your mouth gets dry*        3 . "Super Swallow"  - Take a breath and hold it  - Bear down (like pushing your bowels)  - Swallow then IMMEDIATELY cough  - Repeat 10 times, 2 times a day

## 2022-05-22 NOTE — Therapy (Signed)
Marland Kitchen OUTPATIENT SPEECH LANGUAGE PATHOLOGY SWALLOW EVALUATION   Patient Name: Anne Barnes MRN: [id number] DOB:1945-12-31, 76 y.o., female Today's Date: 05/22/2022  PCP: Gwyneth Revels, MD REFERRING PROVIDER: Lavena Bullion, MD:    End of Session - 05/22/22 1256     Visit Number 1    Number of Visits 12    Date for SLP Re-Evaluation 07/24/22    SLP Start Time 1103    SLP Stop Time  1145    SLP Time Calculation (min) 42 min    Activity Tolerance Patient tolerated treatment well             Past Medical History:  Diagnosis Date   Acute combined systolic and diastolic heart failure ([place]) 05/26/2015   ALLERGIC RHINITIS    Allergic urticaria 08/01/2014   Patient reports allergies to shrimp, egg whites, tomatoes, dairy    Allergy    seasonal and numerous food and drug allergies.   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   Office Spirometry 11/07/17-WNL-FVC 1.58/82%, FEV1 1.27/85%, ratio 0.80, FEF 25-75% 0.26/89%   Bradycardia 02/25/2017   Chiari malformation    Noted MRI brain 09/2013 - s/p neuro eval for same   DIVERTICULOSIS, COLON    Essential hypertension 08/28/2010   Qualifier: Diagnosis of  By: Marca Ancona RMA, Lucy      Frequent headaches    GERD    H/O measles    H/O mumps    Hearing loss 01/18/2017   History of chicken pox    Hyperglycemia 03/05/2016   HYPERTENSION    Hypocalcemia 02/25/2017   IBS (irritable bowel syndrome) 05/26/2015   Nonspecific abnormal electrocardiogram (ECG) (EKG)    OBSTRUCTIVE SLEEP APNEA 12/2008 dx   noncompliant with CPAP qhs   Obstructive sleep apnea 08/28/2010   NPSG 12/2008:  AHI 13/hr with desats to 78%    Preventative health care 06/11/2017   Sciatica    right side   Seasonal and perennial allergic rhinitis 08/28/2010   Allergy profile 03/11/14- positive especially for dust, cat and dog Food allergy profile- total IgE 86 with several food group elevations Sed rate-WNL      Sinusitis 06/06/2017   Sleep apnea    uses cpap   Tinnitus of right ear  03/14/2014   Vaginitis 01/18/2017   Vitamin D deficiency 06/11/2017   Past Surgical History:  Procedure Laterality Date   [place]   Breast biopsy   COLONOSCOPY  2011   Dr Collene Mares   UMBILICAL HERNIA REPAIR     Patient Active Problem List   Diagnosis Date Noted   Neck pain on left side 12/13/2021   Congestion of throat 12/13/2021   Osteoporosis 12/13/2021   COVID 04/20/2021   Chest pain of uncertain etiology 60/63/0160   Allergies 12/22/2019   Dizziness 11/19/2019   Grief reaction 08/27/2019   Left shoulder pain 08/27/2019   Hyperlipidemia 07/14/2018   Asthma, mild intermittent 06/06/2018   Morbid obesity ([place]) 01/09/2018   Vitamin D deficiency 06/11/2017   Preventative health care 06/11/2017   Hypocalcemia 02/25/2017   Bradycardia 02/25/2017   Hearing loss 01/18/2017   Hyperglycemia 03/05/2016   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   CHF (congestive heart failure) ([place]) 05/26/2015   Female bladder prolapse 05/26/2015   IBS (irritable bowel syndrome) 05/26/2015   History of chicken pox    Allergic urticaria 08/01/2014   Food allergy 08/01/2014   Tinnitus 03/14/2014   Osteoarthritis    Nonspecific abnormal electrocardiogram (ECG) (EKG)  Headache 10/21/2013   Chiari malformation    GERD 11/10/2010   Obstructive sleep apnea 08/28/2010   Essential hypertension 08/28/2010   Seasonal and perennial allergic rhinitis 08/28/2010   DIVERTICULOSIS, COLON 08/28/2010    ONSET DATE: "Two years ago"   REFERRING DIAG: R13.10 (ICD-10-CM) - Dysphagia, unspecified type  THERAPY DIAG:  Dysphagia, pharyngeal phase  Rationale for Evaluation and Treatment Rehabilitation  SUBJECTIVE:   SUBJECTIVE STATEMENT: "Since I have been thickening my liquids I haven't been coughing." Pt accompanied by: self  PERTINENT HISTORY: Anne Barnes is a 76 y.o. female with PMH: allergic rhinitis, mild asthma, OSA, CHF, nonobstructive CAD on coronary CTA,  HTN, obesity, bradycardia, chiari malformation, GERD followed by GI. Per 04/27/22 GI note, patient reporting her reflux symptoms started in late 2022 and included heartburn, intermittent chest pain lasting up to a few hours, increased belching, can have associated dyspepsia. She was started on Prilosec 40 mg for four weeks with plan to titrate to lowes effective dose; EGD recommended but has not been completed.   PAIN:  Are you having pain? No  FALLS: Has patient fallen in last 6 months?  No  LIVING ENVIRONMENT: Lives with: lives with their spouse Lives in: House/apartment  PLOF:  Level of assistance: Independent with ADLs Employment: Retired   PATIENT GOALS  Regain normal swallow function  OBJECTIVE:   DIAGNOSTIC FINDINGS:  MRI HEAD WITHOUT CONTRAST  01/28/20 TECHNIQUE: Multiplanar, multiecho pulse sequences of the brain and surrounding structures were obtained without intravenous contrast. COMPARISON:  Head CT November 24, 2019. FINDINGS: Brain: No acute infarction, hemorrhage, hydrocephalus, extra-axial collection or mass lesion. Few scattered foci of T2 hyperintensity are seen within the white matter of the cerebral hemispheres, nonspecific. Prominence of the cerebral sulci in the bilateral parietal regions and prominence of the supratentorial ventricles which has progressed since prior MRI performed in September 30, 2013, but is stable from prior CT. 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 Vascular: Normal flow voids. Skull and upper cervical spine: Normal marrow signal. Sinuses/Orbits: Mild mucosal thickening of the ethmoid cells. The orbits are maintained. IMPRESSION: 1. No acute intracranial abnormality. 2. Prominence of the cerebral sulci in the bilateral parietal regions and prominence of the supratentorial ventricles which has progressed since prior MRI performed in September 30, 2013, but is stable from prior CT performed in September 30, 2013. 3.  Chiari 1 malformation.  RESULTS FROM OBJECTIVE SWALLOW STUDY (MBSS/FEES):   Christionna Poland presents with a normal oral phase of swallow but a moderately impaired pharyngeal phase of swallow. During pharyngeal phase she exhibited swallow initiation delay with thin liquids and at times with nectar thick liquids. She exhibited consistent aspiration during the swallow with cup sips of thin liquids and straw sips of nectar thick liquids. Aspiration occured without patient sensing until after aspirate had transited past vocal cords. She did then sense aspiration with a cough but this did not aid in expelling aspirate. When liquids were transiting, portion of liquid bolus moves under epiglottis before it comes into full contact to protect airway. She did not exhibit any aspiration when taking small sips of nectar thick liquids. No significant difficulties with puree, regular, 42m barium tablet or honey thick liquids. Aspiration appears to be impacted by reduced epiglottic deflection as pharyngeal space is more narrow (?pharyngeal edema) and cannot r/o impact from appearance of prominent cricopharyngeal bar.  When liquids transiting, portion of liquid bolus moves under epiglottis before it comes into full contact to protect airway. SLP is  recommending nectar thick liquids and continue with solids as she has been. Ms Cowman was given a sample box of Simply Thick food thickener (nectar consistency). SLP recommending OP SLP for dysphagia and patient's PCP contacted via secure EPIC message.   COGNITION: Overall cognitive status: Within functional limits for tasks assessed  ORAL MOTOR EXAMINATION Overall status: WFL  CLINICAL SWALLOW ASSESSMENT:   Current diet: regular and nectar thick liquids Dentition:  top/bottom dentures Patient directly observed with POs: Yes: dysphagia 3 (soft) and nectar thick liquids  Feeding: able to feed self Liquids provided by: cup Oral phase signs and symptoms:  none noted Pharyngeal  phase signs and symptoms:  none noted    PATIENT REPORTED OUTCOME MEASURES (PROM): EAT-10: To be ocmpleted next session   TODAY'S TREATMENT:  Discussed MBS results, educated and observed pt with dysphagia HEP - pt was total A faded to modified independence.    PATIENT EDUCATION: Education details: see above in "today's treatment" Person educated: Patient Education method: Explanation, Demonstration, Verbal cues, and Handouts Education comprehension: verbalized understanding, returned demonstration, verbal cues required, and needs further education   ASSESSMENT:  CLINICAL IMPRESSION: Patient is a 76 y.o. female who was seen today for swallowing therapy after a MBS on 05/16/22.Pt without notable neurological history, so SLP suspects pharyngeal dysphagia has a deconditioning component. There is also question esophageal component - pt undergoing EGD procedure 07-21-22. SLP will assist pt with dysphagia HEP to strengthen pharyngeal musculature and may suggest repeat of MBS in 8-10 weeks.   OBJECTIVE IMPAIRMENTS include dysphagia. These impairments are limiting patient from safety when swallowing. Factors affecting potential to achieve goals and functional outcome are co-morbidities (esophageal component?). Patient will benefit from skilled SLP services to address above impairments and improve overall function.  REHAB POTENTIAL: Good   GOALS: Goals reviewed with patient? Yes  SHORT TERM GOALS: Target date: 06/19/2022    Pt will perform HEP with rare min A in 2 sessions Baseline: Goal status: INITIAL  2.  Pt will tell SLP 3 overt s/s aspiration PNA with modified independence in two sessions Baseline:  Goal status: INITIAL  3.  Pt will tell SLP rationale for nectar liquids in two sessions Baseline:  Goal status: INITIAL   LONG TERM GOALS: Target date: 07/24/2022    Pt will perform HEP with modified independence in 2 sessions Baseline:  Goal status: INITIAL   PLAN: SLP  FREQUENCY: 2x/week x4 weeks, then x1/week x2 weeks  SLP DURATION: 8 weeks  PLANNED INTERVENTIONS: Aspiration precaution training, Pharyngeal strengthening exercises, Diet toleration management , Trials of upgraded texture/liquids, Internal/external aids, and Functional tasks    [facility], [place] 05/22/2022, 12:57 PM

## 2022-05-25 ENCOUNTER — Ambulatory Visit (INDEPENDENT_AMBULATORY_CARE_PROVIDER_SITE_OTHER): Payer: Medicare HMO | Admitting: *Deleted

## 2022-05-25 DIAGNOSIS — J309 Allergic rhinitis, unspecified: Secondary | ICD-10-CM

## 2022-05-29 ENCOUNTER — Ambulatory Visit: Payer: Medicare HMO | Attending: Family Medicine

## 2022-05-29 DIAGNOSIS — R1313 Dysphagia, pharyngeal phase: Secondary | ICD-10-CM | POA: Insufficient documentation

## 2022-05-29 NOTE — Therapy (Signed)
Marland Kitchen OUTPATIENT SPEECH LANGUAGE PATHOLOGY SWALLOWING TREATMENT   Patient Name: Anne Barnes MRN: [id number] DOB:11-02-1945, 76 y.o., female Today's Date: 05/29/2022  PCP: Gwyneth Revels, MD REFERRING PROVIDER: Lavena Bullion, MD:    End of Session - 05/29/22 1059     Visit Number 2    Number of Visits 12    Date for SLP Re-Evaluation 07/24/22    SLP Start Time 1018    SLP Stop Time  1056    SLP Time Calculation (min) 38 min    Activity Tolerance Patient tolerated treatment well              Past Medical History:  Diagnosis Date   Acute combined systolic and diastolic heart failure ([place]) 05/26/2015   ALLERGIC RHINITIS    Allergic urticaria 08/01/2014   Patient reports allergies to shrimp, egg whites, tomatoes, dairy    Allergy    seasonal and numerous food and drug allergies.   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   Office Spirometry 11/07/17-WNL-FVC 1.58/82%, FEV1 1.27/85%, ratio 0.80, FEF 25-75% 0.26/89%   Bradycardia 02/25/2017   Chiari malformation    Noted MRI brain 09/2013 - s/p neuro eval for same   DIVERTICULOSIS, COLON    Essential hypertension 08/28/2010   Qualifier: Diagnosis of  By: Marca Ancona RMA, Lucy      Frequent headaches    GERD    H/O measles    H/O mumps    Hearing loss 01/18/2017   History of chicken pox    Hyperglycemia 03/05/2016   HYPERTENSION    Hypocalcemia 02/25/2017   IBS (irritable bowel syndrome) 05/26/2015   Nonspecific abnormal electrocardiogram (ECG) (EKG)    OBSTRUCTIVE SLEEP APNEA 12/2008 dx   noncompliant with CPAP qhs   Obstructive sleep apnea 08/28/2010   NPSG 12/2008:  AHI 13/hr with desats to 78%    Preventative health care 06/11/2017   Sciatica    right side   Seasonal and perennial allergic rhinitis 08/28/2010   Allergy profile 03/11/14- positive especially for dust, cat and dog Food allergy profile- total IgE 86 with several food group elevations Sed rate-WNL      Sinusitis 06/06/2017   Sleep apnea    uses cpap   Tinnitus of right ear  03/14/2014   Vaginitis 01/18/2017   Vitamin D deficiency 06/11/2017   Past Surgical History:  Procedure Laterality Date   [place]   Breast biopsy   COLONOSCOPY  2011   Dr Collene Mares   UMBILICAL HERNIA REPAIR     Patient Active Problem List   Diagnosis Date Noted   Neck pain on left side 12/13/2021   Congestion of throat 12/13/2021   Osteoporosis 12/13/2021   COVID 04/20/2021   Chest pain of uncertain etiology 93/81/8299   Allergies 12/22/2019   Dizziness 11/19/2019   Grief reaction 08/27/2019   Left shoulder pain 08/27/2019   Hyperlipidemia 07/14/2018   Asthma, mild intermittent 06/06/2018   Morbid obesity ([place]) 01/09/2018   Vitamin D deficiency 06/11/2017   Preventative health care 06/11/2017   Hypocalcemia 02/25/2017   Bradycardia 02/25/2017   Hearing loss 01/18/2017   Hyperglycemia 03/05/2016   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   CHF (congestive heart failure) ([place]) 05/26/2015   Female bladder prolapse 05/26/2015   IBS (irritable bowel syndrome) 05/26/2015   History of chicken pox    Allergic urticaria 08/01/2014   Food allergy 08/01/2014   Tinnitus 03/14/2014   Osteoarthritis    Nonspecific abnormal electrocardiogram (ECG) (  EKG)    Headache 10/21/2013   Chiari malformation    GERD 11/10/2010   Obstructive sleep apnea 08/28/2010   Essential hypertension 08/28/2010   Seasonal and perennial allergic rhinitis 08/28/2010   DIVERTICULOSIS, COLON 08/28/2010    ONSET DATE: "Two years ago"   REFERRING DIAG: R13.10 (ICD-10-CM) - Dysphagia, unspecified type  THERAPY DIAG:  Dysphagia, pharyngeal phase  Rationale for Evaluation and Treatment Rehabilitation  SUBJECTIVE:   SUBJECTIVE STATEMENT: "I went to [place]. I didn't start them until yesterday." Pt accompanied by: self  PERTINENT HISTORY: Anne Barnes is a 76 y.o. female with PMH: allergic rhinitis, mild asthma, OSA, CHF, nonobstructive CAD on coronary CTA, HTN,  obesity, bradycardia, chiari malformation, GERD followed by GI. Per 04/27/22 GI note, patient reporting her reflux symptoms started in late 2022 and included heartburn, intermittent chest pain lasting up to a few hours, increased belching, can have associated dyspepsia. She was started on Prilosec 40 mg for four weeks with plan to titrate to lowes effective dose; EGD recommended but has not been completed.   PAIN:  Are you having pain? No   PATIENT GOALS  Regain normal swallow function  OBJECTIVE:   DIAGNOSTIC FINDINGS:  MRI HEAD WITHOUT CONTRAST  01/28/20 TECHNIQUE: Multiplanar, multiecho pulse sequences of the brain and surrounding structures were obtained without intravenous contrast. COMPARISON:  Head CT November 24, 2019. FINDINGS: Brain: No acute infarction, hemorrhage, hydrocephalus, extra-axial collection or mass lesion. Few scattered foci of T2 hyperintensity are seen within the white matter of the cerebral hemispheres, nonspecific. Prominence of the cerebral sulci in the bilateral parietal regions and prominence of the supratentorial ventricles which has progressed since prior MRI performed in September 30, 2013, but is stable from prior CT. 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 Vascular: Normal flow voids. Skull and upper cervical spine: Normal marrow signal. Sinuses/Orbits: Mild mucosal thickening of the ethmoid cells. The orbits are maintained. IMPRESSION: 1. No acute intracranial abnormality. 2. Prominence of the cerebral sulci in the bilateral parietal regions and prominence of the supratentorial ventricles which has progressed since prior MRI performed in September 30, 2013, but is stable from prior CT performed in September 30, 2013. 3. Chiari 1 malformation.  RESULTS FROM OBJECTIVE SWALLOW STUDY (MBSS/FEES):   Anne Barnes presents with a normal oral phase of swallow but a moderately impaired pharyngeal phase of swallow. During pharyngeal phase she  exhibited swallow initiation delay with thin liquids and at times with nectar thick liquids. She exhibited consistent aspiration during the swallow with cup sips of thin liquids and straw sips of nectar thick liquids. Aspiration occured without patient sensing until after aspirate had transited past vocal cords. She did then sense aspiration with a cough but this did not aid in expelling aspirate. When liquids were transiting, portion of liquid bolus moves under epiglottis before it comes into full contact to protect airway. She did not exhibit any aspiration when taking small sips of nectar thick liquids. No significant difficulties with puree, regular, 4m barium tablet or honey thick liquids. Aspiration appears to be impacted by reduced epiglottic deflection as pharyngeal space is more narrow (?pharyngeal edema) and cannot r/o impact from appearance of prominent cricopharyngeal bar.  When liquids transiting, portion of liquid bolus moves under epiglottis before it comes into full contact to protect airway. SLP is recommending nectar thick liquids and continue with solids as she has been. Ms SMertzwas given a sample box of Simply Thick food thickener (nectar consistency). SLP recommending OP SLP for  dysphagia and patient's PCP contacted via secure EPIC message.     PATIENT REPORTED OUTCOME MEASURES (PROM): EAT-10: completed today with pt. Score was 12. "4" reported with #2 (ability to go out for meals), and #3 (swallowing liquids takes extra effort); "2" reported with #9 (coughing when I eat/drink) and #10 (swallowing is stressful).    TODAY'S TREATMENT:  05/29/22: Pt states effortful swallow has been the most challenging exercise thus far but there is a limited sample size as pt just begun performing her HEP. Today, she filled out EAT-10 with result above. HEP was completed with SLP guiding pt through her exercises - she req'd mod A for all exercises but was modified independent by session end.    05-22-22: Discussed MBS results, educated and observed pt with dysphagia HEP - pt was total A faded to modified independence.    PATIENT EDUCATION: Education details: see above in "today's treatment" Person educated: Patient Education method: Explanation, Demonstration, Verbal cues, and Handouts Education comprehension: verbalized understanding, returned demonstration, verbal cues required, and needs further education   ASSESSMENT:  CLINICAL IMPRESSION: Patient is a 76 y.o. female who was seen today for swallowing therapy after a MBS on 05/16/22.Pt without notable neurological history, so SLP suspects pharyngeal dysphagia has a deconditioning component. There is also question esophageal component - pt undergoing EGD procedure 07-21-22. SLP will assist pt with dysphagia HEP to strengthen pharyngeal musculature and may suggest repeat of MBS in 8-10 weeks.   OBJECTIVE IMPAIRMENTS include dysphagia. These impairments are limiting patient from safety when swallowing. Factors affecting potential to achieve goals and functional outcome are co-morbidities (esophageal component?). Patient will benefit from skilled SLP services to address above impairments and improve overall function.  REHAB POTENTIAL: Good   GOALS: Goals reviewed with patient? Yes  SHORT TERM GOALS: Target date: 06/19/2022    Pt will perform HEP with rare min A in 2 sessions Baseline: Goal status: [place]  2.  Pt will tell SLP 3 overt s/s aspiration PNA with modified independence in two sessions Baseline:  Goal status: [place]  3.  Pt will tell SLP rationale for nectar liquids in two sessions Baseline:  Goal status: [place]   LONG TERM GOALS: Target date: 07/24/2022    Pt will perform HEP with modified independence in 2 sessions Baseline:  Goal status: [place]   PLAN: SLP FREQUENCY: 2x/week x4 weeks, then x1/week x2 weeks  SLP DURATION: 8 weeks  PLANNED INTERVENTIONS: Aspiration precaution training, Pharyngeal  strengthening exercises, Diet toleration management , Trials of upgraded texture/liquids, Internal/external aids, and Functional tasks    [facility], [place] 05/29/2022, 10:59 AM

## 2022-05-30 ENCOUNTER — Ambulatory Visit: Payer: Medicare HMO

## 2022-05-30 DIAGNOSIS — R1313 Dysphagia, pharyngeal phase: Secondary | ICD-10-CM

## 2022-05-30 NOTE — Therapy (Signed)
[facility] OUTPATIENT SPEECH LANGUAGE PATHOLOGY SWALLOWING TREATMENT   Patient Name: Anne Barnes MRN: [id number] DOB:Sep 01, 1946, 76 y.o., female Today's Date: 05/30/2022  PCP: Gwyneth Revels, MD REFERRING PROVIDER: Lavena Bullion, MD:    End of Session - 05/30/22 1626     Visit Number 3    Number of Visits 12    Date for SLP Re-Evaluation 07/24/22    SLP Start Time 1450    SLP Stop Time  7829    SLP Time Calculation (min) 40 min    Activity Tolerance Patient tolerated treatment well               Past Medical History:  Diagnosis Date   Acute combined systolic and diastolic heart failure ([place]) 05/26/2015   ALLERGIC RHINITIS    Allergic urticaria 08/01/2014   Patient reports allergies to shrimp, egg whites, tomatoes, dairy    Allergy    seasonal and numerous food and drug allergies.   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   Office Spirometry 11/07/17-WNL-FVC 1.58/82%, FEV1 1.27/85%, ratio 0.80, FEF 25-75% 0.26/89%   Bradycardia 02/25/2017   Chiari malformation    Noted MRI brain 09/2013 - s/p neuro eval for same   DIVERTICULOSIS, COLON    Essential hypertension 08/28/2010   Qualifier: Diagnosis of  By: Marca Ancona RMA, Lucy      Frequent headaches    GERD    H/O measles    H/O mumps    Hearing loss 01/18/2017   History of chicken pox    Hyperglycemia 03/05/2016   HYPERTENSION    Hypocalcemia 02/25/2017   IBS (irritable bowel syndrome) 05/26/2015   Nonspecific abnormal electrocardiogram (ECG) (EKG)    OBSTRUCTIVE SLEEP APNEA 12/2008 dx   noncompliant with CPAP qhs   Obstructive sleep apnea 08/28/2010   NPSG 12/2008:  AHI 13/hr with desats to 78%    Preventative health care 06/11/2017   Sciatica    right side   Seasonal and perennial allergic rhinitis 08/28/2010   Allergy profile 03/11/14- positive especially for dust, cat and dog Food allergy profile- total IgE 86 with several food group elevations Sed rate-WNL      Sinusitis 06/06/2017   Sleep apnea    uses cpap   Tinnitus of right ear  03/14/2014   Vaginitis 01/18/2017   Vitamin D deficiency 06/11/2017   Past Surgical History:  Procedure Laterality Date   [place]   Breast biopsy   COLONOSCOPY  2011   Dr Collene Mares   UMBILICAL HERNIA REPAIR     Patient Active Problem List   Diagnosis Date Noted   Neck pain on left side 12/13/2021   Congestion of throat 12/13/2021   Osteoporosis 12/13/2021   COVID 04/20/2021   Chest pain of uncertain etiology 56/21/3086   Allergies 12/22/2019   Dizziness 11/19/2019   Grief reaction 08/27/2019   Left shoulder pain 08/27/2019   Hyperlipidemia 07/14/2018   Asthma, mild intermittent 06/06/2018   Morbid obesity ([place]) 01/09/2018   Vitamin D deficiency 06/11/2017   Preventative health care 06/11/2017   Hypocalcemia 02/25/2017   Bradycardia 02/25/2017   Hearing loss 01/18/2017   Hyperglycemia 03/05/2016   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   CHF (congestive heart failure) ([place]) 05/26/2015   Female bladder prolapse 05/26/2015   IBS (irritable bowel syndrome) 05/26/2015   History of chicken pox    Allergic urticaria 08/01/2014   Food allergy 08/01/2014   Tinnitus 03/14/2014   Osteoarthritis    Nonspecific abnormal electrocardiogram (  ECG) (EKG)    Headache 10/21/2013   Chiari malformation    GERD 11/10/2010   Obstructive sleep apnea 08/28/2010   Essential hypertension 08/28/2010   Seasonal and perennial allergic rhinitis 08/28/2010   DIVERTICULOSIS, COLON 08/28/2010    ONSET DATE: "Two years ago"   REFERRING DIAG: R13.10 (ICD-10-CM) - Dysphagia, unspecified type  THERAPY DIAG:  Dysphagia, pharyngeal phase  Rationale for Evaluation and Treatment Rehabilitation  SUBJECTIVE:   SUBJECTIVE STATEMENT: "I went to [place]. I didn't start them until yesterday." Pt accompanied by: self  PERTINENT HISTORY: Anne Barnes is a 76 y.o. female with PMH: allergic rhinitis, mild asthma, OSA, CHF, nonobstructive CAD on coronary CTA, HTN,  obesity, bradycardia, chiari malformation, GERD followed by GI. Per 04/27/22 GI note, patient reporting her reflux symptoms started in late 2022 and included heartburn, intermittent chest pain lasting up to a few hours, increased belching, can have associated dyspepsia. She was started on Prilosec 40 mg for four weeks with plan to titrate to lowes effective dose; EGD recommended but has not been completed.   PAIN:  Are you having pain? No   PATIENT GOALS  Regain normal swallow function  OBJECTIVE:   DIAGNOSTIC FINDINGS:  MRI HEAD WITHOUT CONTRAST  01/28/20 TECHNIQUE: Multiplanar, multiecho pulse sequences of the brain and surrounding structures were obtained without intravenous contrast. COMPARISON:  Head CT November 24, 2019. FINDINGS: Brain: No acute infarction, hemorrhage, hydrocephalus, extra-axial collection or mass lesion. Few scattered foci of T2 hyperintensity are seen within the white matter of the cerebral hemispheres, nonspecific. Prominence of the cerebral sulci in the bilateral parietal regions and prominence of the supratentorial ventricles which has progressed since prior MRI performed in September 30, 2013, but is stable from prior CT. 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 Vascular: Normal flow voids. Skull and upper cervical spine: Normal marrow signal. Sinuses/Orbits: Mild mucosal thickening of the ethmoid cells. The orbits are maintained. IMPRESSION: 1. No acute intracranial abnormality. 2. Prominence of the cerebral sulci in the bilateral parietal regions and prominence of the supratentorial ventricles which has progressed since prior MRI performed in September 30, 2013, but is stable from prior CT performed in September 30, 2013. 3. Chiari 1 malformation.  RESULTS FROM OBJECTIVE SWALLOW STUDY (MBSS/FEES):   Anne Barnes presents with a normal oral phase of swallow but a moderately impaired pharyngeal phase of swallow. During pharyngeal phase she  exhibited swallow initiation delay with thin liquids and at times with nectar thick liquids. She exhibited consistent aspiration during the swallow with cup sips of thin liquids and straw sips of nectar thick liquids. Aspiration occured without patient sensing until after aspirate had transited past vocal cords. She did then sense aspiration with a cough but this did not aid in expelling aspirate. When liquids were transiting, portion of liquid bolus moves under epiglottis before it comes into full contact to protect airway. She did not exhibit any aspiration when taking small sips of nectar thick liquids. No significant difficulties with puree, regular, 75m barium tablet or honey thick liquids. Aspiration appears to be impacted by reduced epiglottic deflection as pharyngeal space is more narrow (?pharyngeal edema) and cannot r/o impact from appearance of prominent cricopharyngeal bar.  When liquids transiting, portion of liquid bolus moves under epiglottis before it comes into full contact to protect airway. SLP is recommending nectar thick liquids and continue with solids as she has been. Ms SRathelwas given a sample box of Simply Thick food thickener (nectar consistency). SLP recommending OP SLP  for dysphagia and patient's PCP contacted via secure EPIC message.     PATIENT REPORTED OUTCOME MEASURES (PROM): EAT-10: completed today with pt. Score was 12. "4" reported with #2 (ability to go out for meals), and #3 (swallowing liquids takes extra effort); "2" reported with #9 (coughing when I eat/drink) and #10 (swallowing is stressful).    TODAY'S TREATMENT:  05/30/22: SLP explained to pt that when she is modified independent with HEP we can decr frequency to once/week. Today pt req'd min-mod A for 2/3 exercises, faded to rare min A. She will have daughter attend next session to give her assistance if she requires it. She told SLP rationale for nectar liquids - uses this for all liquids at home.   05/29/22: Pt  states effortful swallow has been the most challenging exercise thus far but there is a limited sample size as pt just begun performing her HEP. Today, she filled out EAT-10 with result above. HEP was completed with SLP guiding pt through her exercises - she req'd mod A for all exercises but was modified independent by session end.   05-22-22: Discussed MBS results, educated and observed pt with dysphagia HEP - pt was total A faded to modified independence.    PATIENT EDUCATION: Education details: see above in "today's treatment" Person educated: Patient Education method: Explanation, Demonstration, Verbal cues, and Handouts Education comprehension: verbalized understanding, returned demonstration, verbal cues required, and needs further education   ASSESSMENT:  CLINICAL IMPRESSION: Patient is a 76 y.o. female who was seen today for swallowing therapy after a MBS on 05/16/22.Pt without notable neurological history, so SLP suspects pharyngeal dysphagia has a deconditioning component. There is also question esophageal component - pt undergoing EGD procedure 07-21-22. Pt is highly motivated. SLP will assist pt with dysphagia HEP to strengthen pharyngeal musculature and may suggest repeat of MBS in 8-10 weeks.   OBJECTIVE IMPAIRMENTS include dysphagia. These impairments are limiting patient from safety when swallowing. Factors affecting potential to achieve goals and functional outcome are co-morbidities (esophageal component?). Patient will benefit from skilled SLP services to address above impairments and improve overall function.  REHAB POTENTIAL: Good   GOALS: Goals reviewed with patient? Yes  SHORT TERM GOALS: Target date: 06/19/2022    Pt will perform HEP with rare min A in 2 sessions Baseline: Goal status: [place]  2.  Pt will tell SLP 3 overt s/s aspiration PNA with modified independence in two sessions Baseline:  Goal status: [place]  3.  Pt will tell SLP rationale for nectar  liquids in two sessions Baseline: 05/30/22 Goal status: [place]   LONG TERM GOALS: Target date: 07/24/2022    Pt will perform HEP with modified independence in 2 sessions Baseline:  Goal status: [place]   PLAN: SLP FREQUENCY: 2x/week x4 weeks, then x1/week x2 weeks  SLP DURATION: 8 weeks  PLANNED INTERVENTIONS: Aspiration precaution training, Pharyngeal strengthening exercises, Diet toleration management , Trials of upgraded texture/liquids, Internal/external aids, and Functional tasks    [facility], [place] 05/30/2022, 4:26 PM

## 2022-06-04 ENCOUNTER — Telehealth: Payer: Self-pay

## 2022-06-04 ENCOUNTER — Other Ambulatory Visit: Payer: Self-pay | Admitting: Family Medicine

## 2022-06-04 ENCOUNTER — Ambulatory Visit (INDEPENDENT_AMBULATORY_CARE_PROVIDER_SITE_OTHER): Payer: Medicare HMO

## 2022-06-04 VITALS — BP 132/65 | HR 79 | Temp 98.2°F | Ht 59.0 in | Wt 230.0 lb

## 2022-06-04 DIAGNOSIS — J4520 Mild intermittent asthma, uncomplicated: Secondary | ICD-10-CM

## 2022-06-04 DIAGNOSIS — Z0000 Encounter for general adult medical examination without abnormal findings: Secondary | ICD-10-CM | POA: Diagnosis not present

## 2022-06-04 MED ORDER — AMLODIPINE BESYLATE 2.5 MG PO TABS: 2.5000 mg | ORAL_TABLET | Freq: Two times a day (BID) | ORAL | 1 refills | Status: DC

## 2022-06-04 MED ORDER — MONTELUKAST SODIUM 10 MG PO TABS: 10.0000 mg | ORAL_TABLET | Freq: Every day | ORAL | 1 refills | Status: DC

## 2022-06-04 NOTE — Progress Notes (Signed)
Subjective:   Anne Barnes is a 76 y.o. female who presents for Medicare Annual (Subsequent) preventive examination.  I connected with Keiry today by telephone and verified that I am speaking with the correct person using two identifiers. Location patient: home Location provider: work Persons participating in the virtual visit: patient, Marine scientist.    I discussed the limitations, risks, security and privacy concerns of performing an evaluation and management service by telephone and the availability of in person appointments. I also discussed with the patient that there may be a patient responsible charge related to this service. The patient expressed understanding and verbally consented to this telephonic visit.    Interactive audio and video telecommunications were attempted between this provider and patient, however failed, due to patient having technical difficulties OR patient did not have access to video capability.  We continued and completed visit with audio only.  Some vital signs may be absent or patient reported.   Time Spent with patient on telephone encounter: 30 minutes   Review of Systems     Cardiac Risk Factors include: advanced age (>52mn, >>87women);dyslipidemia;hypertension;obesity (BMI >30kg/m2)     Objective:    Today's Vitals   06/04/22 1232  BP: 132/65  Pulse: 79  Temp: 98.2 F (36.8 C)  Weight: 230 lb (104.3 kg)  Height: '4\' 11"'$  (1.499 m)   Body mass index is 46.45 kg/m.     06/04/2022   12:39 PM 01/01/2022    3:34 PM 04/17/2021    1:44 PM 05/24/2020   10:53 AM 04/04/2020    1:16 PM 01/13/2020   11:15 AM 11/24/2019    9:24 AM  Advanced Directives  Does Patient Have a Medical Advance Directive? Yes No No No No No No  [place] of [place]       Does patient want to make changes to medical advance directive?     No - Patient declined    Copy of [place]? No - copy requested        Would  patient like information on creating a medical advance directive?  No - Patient declined Yes (MAU/Ambulatory/Procedural Areas - Information given)        Current Medications (verified) Outpatient Encounter Medications as of 06/04/2022  Medication Sig   albuterol (VENTOLIN HFA) 108 (90 Base) MCG/ACT inhaler INHALE 2 PUFFS BY MOUTH EVERY 6 HOURS AS NEEDED FOR WHEEZING FOR SHORTNESS OF BREATH   alendronate (FOSAMAX) 70 MG tablet Take 1 tablet (70 mg total) by mouth once a week. Take with a full glass of water on an empty stomach.   ALPRAZolam (XANAX) 0.5 MG tablet TAKE 1 TABLET BY MOUTH TWICE DAILY AS NEEDED FOR ANXIETY AND FOR SLEEP   amLODipine (NORVASC) 2.5 MG tablet Take 1 tablet (2.5 mg total) by mouth 2 (two) times daily.   aspirin EC 81 MG tablet Take 81 mg by mouth daily. Swallow whole.   azelastine (ASTELIN) 0.1 % nasal spray Place 2 sprays into both nostrils 2 (two) times daily as needed for rhinitis.   b complex vitamins tablet Take 1 tablet by mouth daily.   Cholecalciferol (VITAMIN D) 50 MCG (2000 UT) tablet Take 2,000 Units by mouth daily.   fluticasone (FLONASE) 50 MCG/ACT nasal spray Place 2 sprays into both nostrils daily.   levocetirizine (XYZAL) 5 MG tablet TAKE 1 TABLET BY MOUTH ONCE DAILY IN THE EVENING   losartan (COZAAR) 100 MG tablet Take 1 tablet  by mouth once daily   METAMUCIL FIBER PO Take by mouth. Take 1 spoon once daily   montelukast (SINGULAIR) 10 MG tablet Take 1 tablet (10 mg total) by mouth at bedtime.   Multiple Vitamin (MULTIVITAMIN) tablet Take 1 tablet by mouth daily.   olopatadine (PATANOL) 0.1 % ophthalmic solution Place 1 drop into both eyes 2 (two) times daily.   omeprazole (PRILOSEC) 40 MG capsule Take 1 capsule (40 mg total) by mouth 2 (two) times daily.   rosuvastatin (CRESTOR) 10 MG tablet Take 1 tablet (10 mg total) by mouth daily.   spironolactone (ALDACTONE) 25 MG tablet Take 1 tablet (25 mg total) by mouth daily.   EPINEPHrine 0.3 mg/0.3 mL IJ  SOAJ injection Inject 0.3 mg into the muscle as needed for anaphylaxis. (Patient not taking: Reported on 04/27/2022)   famotidine (PEPCID) 40 MG tablet Take 1 tablet (40 mg total) by mouth 2 (two) times daily. (Patient not taking: Reported on 05/22/2022)   furosemide (LASIX) 20 MG tablet Take 1 tablet (20 mg total) by mouth 3 (three) times a week. Monday, Wednesday and Friday.   No facility-administered encounter medications on file as of 06/04/2022.    Allergies (verified) Codeine, Eggs or egg-derived products, Hydralazine hcl, Lisinopril-hydrochlorothiazide, Metoprolol, Other, Shrimp [shellfish allergy], and Tomato   History: Past Medical History:  Diagnosis Date   Acute combined systolic and diastolic heart failure ([place]) 05/26/2015   ALLERGIC RHINITIS    Allergic urticaria 08/01/2014   Patient reports allergies to shrimp, egg whites, tomatoes, dairy    Allergy    seasonal and numerous food and drug allergies.   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   Office Spirometry 11/07/17-WNL-FVC 1.58/82%, FEV1 1.27/85%, ratio 0.80, FEF 25-75% 0.26/89%   Bradycardia 02/25/2017   Chiari malformation    Noted MRI brain 09/2013 - s/p neuro eval for same   DIVERTICULOSIS, COLON    Essential hypertension 08/28/2010   Qualifier: Diagnosis of  By: Marca Ancona RMA, Lucy      Frequent headaches    GERD    H/O measles    H/O mumps    Hearing loss 01/18/2017   History of chicken pox    Hyperglycemia 03/05/2016   HYPERTENSION    Hypocalcemia 02/25/2017   IBS (irritable bowel syndrome) 05/26/2015   Nonspecific abnormal electrocardiogram (ECG) (EKG)    OBSTRUCTIVE SLEEP APNEA 12/2008 dx   noncompliant with CPAP qhs   Obstructive sleep apnea 08/28/2010   NPSG 12/2008:  AHI 13/hr with desats to 78%    Preventative health care 06/11/2017   Sciatica    right side   Seasonal and perennial allergic rhinitis 08/28/2010   Allergy profile 03/11/14- positive especially for dust, cat and dog Food allergy profile- total IgE 86 with several  food group elevations Sed rate-WNL      Sinusitis 06/06/2017   Sleep apnea    uses cpap   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   Vaginitis 01/18/2017   Vitamin D deficiency 06/11/2017   Past Surgical History:  Procedure Laterality Date   [place]   Breast biopsy   COLONOSCOPY  2011   Dr Collene Mares   UMBILICAL HERNIA REPAIR     Family History  Problem Relation Age of Onset   Arthritis Mother        died of complications from hip surgery   Arthritis Father    Kidney disease Father    Pneumonia Father    Heart disease Father  Lupus Daughter    Rheum arthritis Daughter    Sjogren's syndrome Daughter    Fibromyalgia Daughter    Diabetes Brother    Heart disease Brother    Heart attack Brother    Alcohol abuse Brother    Throat cancer Brother    Lung cancer Brother    Kidney disease Sister        dialysis   Obesity Sister    Hypertension Maternal Grandfather    Hypertension Sister    Kidney disease Sister        dialysis   Pneumonia Brother    Hypertension Daughter    Berenice Primas' disease Daughter    Hypertension Daughter    Alcohol abuse Other        parent   Ataxia Neg Hx    [place]orea Neg Hx    Dementia Neg Hx    Mental retardation Neg Hx    Migraines Neg Hx    Multiple sclerosis Neg Hx    Neurofibromatosis Neg Hx    Neuropathy Neg Hx    Parkinsonism Neg Hx    Seizures Neg Hx    Stroke Neg Hx    Colon cancer Neg Hx    Colon polyps Neg Hx    Esophageal cancer Neg Hx    Rectal cancer Neg Hx    Stomach cancer Neg Hx    Social History   Socioeconomic History   Marital status: Widowed    Spouse name: Not on file   Number of children: 5   Years of education: Not on file   Highest education level: Not on file  Occupational History   Occupation: retired  Tobacco Use   Smoking status: Never   Smokeless tobacco: Never   Tobacco comments:    dtr 2 g-kids.  Vaping Use   Vaping Use: Never used  Substance and Sexual Activity    Alcohol use: No    Comment: rare   Drug use: No   Sexual activity: Not Currently  Other Topics Concern   Not on file  Social History Narrative   Right handed   One story with daughter and two grandkids   Social Determinants of Health   Financial Resource Strain: Low Risk  (06/04/2022)   Overall Financial Resource Strain (CARDIA)    Difficulty of Paying Living Expenses: Not hard at all  Food Insecurity: No Food Insecurity (06/04/2022)   Hunger Vital Sign    Worried About Running Out of Food in the Last Year: Never true    Ran Out of Food in the Last Year: Never true  Transportation Needs: No Transportation Needs (06/04/2022)   PRAPARE - Hydrologist (Medical): No    Lack of Transportation (Non-Medical): No  Physical Activity: Insufficiently Active (06/04/2022)   Exercise Vital Sign    Days of Exercise per Week: 4 days    Minutes of Exercise per Session: 30 min  Stress: No Stress Concern Present (06/04/2022)   [place]    Feeling of Stress : Not at all  Social Connections: Moderately Isolated (06/04/2022)   Social Connection and Isolation Panel [NHANES]    Frequency of Communication with Friends and Family: More than three times a week    Frequency of Social Gatherings with Friends and Family: More than three times a week    Attends Religious Services: More than 4 times per year    Active Member of Clubs or Organizations: No  Attends Archivist Meetings: Never    Marital Status: Divorced    Tobacco Counseling Counseling given: Not Answered Tobacco comments: dtr 2 g-kids.   Clinical Intake:  Pre-visit preparation completed: Yes  Pain : No/denies pain     BMI - recorded: 46.45 Nutritional Status: BMI > 30  Obese Nutritional Risks: None Diabetes: No  How often do you need to have someone help you when you read instructions, pamphlets, or other written materials  from your doctor or pharmacy?: 1 - Never  Diabetic?No  Interpreter Needed?: No  Information entered by :: Caroleen Hamman LPN   Activities of Daily Living    06/04/2022   12:43 PM  In your present state of health, do you have any difficulty performing the following activities:  Hearing? 0  Vision? 0  Difficulty concentrating or making decisions? 0  Walking or climbing stairs? 0  Dressing or bathing? 0  Doing errands, shopping? 0  Preparing Food and eating ? N  Using the Toilet? N  In the past six months, have you accidently leaked urine? Y  Do you have problems with loss of bowel control? N  Managing your Medications? N  Managing your Finances? N  Housekeeping or managing your Housekeeping? N    Patient Care Team: Mosie Lukes, MD as PCP - General (Family Medicine) Freada Bergeron, MD as PCP - Cardiology (Cardiology) Juanita Craver, MD (Gastroenterology) Deneise Lever, MD as Consulting Physician (Pulmonary Disease) Randon Goldsmith, OT as Occupational Therapist (Occupational Therapy) Sharmon Revere as Physician Assistant (Cardiology)  Indicate any recent Medical Services you may have received from other than [facility] providers in the past year (date may be approximate).     Assessment:   This is a routine wellness examination for Zamariya.  Hearing/Vision screen Hearing Screening - Comments:: Patient states &quot; my hearing is terrible&quot; Vision Screening - Comments:: Last eye exam-2 years ago  Dietary issues and exercise activities discussed: Current Exercise Habits: Home exercise routine, Type of exercise: walking, Time (Minutes): 30, Frequency (Times/Week): 4, Weekly Exercise (Minutes/Week): 120, Intensity: Mild, Exercise limited by: None identified   Goals Addressed             This Visit's Progress    Patient Stated       Would like to lose some weight       Depression Screen    06/04/2022   12:43 PM 03/29/2022   11:25 AM 12/12/2021     3:04 PM 04/17/2021    1:49 PM 04/14/2021   11:06 AM 12/07/2020   11:09 AM 10/20/2020    4:12 PM  PHQ 2/9 Scores  PHQ - 2 Score 0 0 '1 1 1 2 '$ 0  PHQ- 9 Score      3 2    Fall Risk    06/04/2022   12:40 PM 03/29/2022   11:25 AM 12/12/2021    3:04 PM 04/17/2021    1:47 PM 10/20/2020   10:47 AM  [place] in the past year? 0 0 0 0 0  Number falls in past yr: 0 0 0 0   Injury with Fall? 0 0  0   Risk for fall due to :  No Fall Risks     Follow up Falls prevention discussed Falls evaluation completed Falls evaluation completed Falls prevention discussed     [place]:  Any stairs in or around the home? Yes  If so, are  there any without handrails? No  Home free of loose throw rugs in walkways, pet beds, electrical cords, etc? Yes  Adequate lighting in your home to reduce risk of falls? Yes   ASSISTIVE DEVICES UTILIZED TO PREVENT FALLS:  Life alert? No  Use of a cane, walker or w/c? No  Grab bars in the bathroom? Yes  Shower chair or bench in shower? Yes  Elevated toilet seat or a handicapped toilet? No   TIMED UP AND GO:  Was the test performed? No . Phone visit   Cognitive Function:Normal cognitive status assessed by this Nurse Health Advisor. No abnormalities found.         06/04/2022   12:51 PM  6CIT Screen  What Year? 0 points  What month? 0 points  What time? 0 points  Count back from 20 0 points  Months in reverse 2 points  Repeat phrase 0 points  Total Score 2 points    Immunizations Immunization History  Administered Date(s) Administered   Fluad Quad(high Dose 65+) 07/31/2021   Influenza Inj Mdck Quad Pf 10/20/2020   Influenza, High Dose Seasonal PF 07/15/2013, 06/06/2018   Influenza,inj,Quad PF,6+ Mos 08/29/2015   Moderna Sars-Covid-2 Vaccination 11/26/2019, 12/24/2019, 10/06/2020   Pneumococcal Conjugate-13 11/30/2015   Pneumococcal Polysaccharide-23 02/26/2013   Td 09/25/2003   Tdap 08/29/2015    TDAP  status: Up to date  Flu Vaccine status: Due, Education has been provided regarding the importance of this vaccine. Advised may receive this vaccine at local pharmacy or Health Dept. Aware to provide a copy of the vaccination record if obtained from local pharmacy or Health Dept. Verbalized acceptance and understanding.  Pneumococcal vaccine status: Up to date  Covid-19 vaccine status: Information provided on how to obtain vaccines.   Qualifies for Shingles Vaccine? Yes   Zostavax completed No   Shingrix Completed?: No.    Education has been provided regarding the importance of this vaccine. Patient has been advised to call insurance company to determine out of pocket expense if they have not yet received this vaccine. Advised may also receive vaccine at local pharmacy or Health Dept. Verbalized acceptance and understanding.  Screening Tests Health Maintenance  Topic Date Due   MAMMOGRAM  11/21/2016   COVID-19 Vaccine (4 - Moderna series) 12/01/2020   INFLUENZA VACCINE  04/24/2022   TETANUS/TDAP  08/28/2025   COLONOSCOPY (Pts 45-48yr Insurance coverage will need to be confirmed)  12/22/2027   Pneumonia Vaccine 75 Years old  Completed   DEXA SCAN  Completed   Hepatitis C Screening  Completed   HPV VACCINES  Aged Out   Zoster Vaccines- Shingrix  Discontinued    Health Maintenance  Health Maintenance Due  Topic Date Due   MAMMOGRAM  11/21/2016   COVID-19 Vaccine (4 - Moderna series) 12/01/2020   INFLUENZA VACCINE  04/24/2022    Colorectal cancer screening: Type of screening: Colonoscopy. Completed 12/21/2020. Repeat every 7 years  Mammogram status: Due-patient declined  Bone Density status: Completed 06/08/2021. Results reflect: Bone density results: OSTEOPOROSIS. Repeat every 2 years.  Lung Cancer Screening: (Low Dose CT Chest recommended if Age 75-80years, 30 pack-year currently smoking OR have quit w/in 15years.) does not qualify.     Additional Screening:  Hepatitis C  Screening: Completed 11/22/2015  Vision Screening: Recommended annual ophthalmology exams for early detection of glaucoma and other disorders of the eye. Is the patient up to date with their annual eye exam?  No  Who is the provider or what is the  name of the office in which the patient attends annual eye exams? [facility]   Dental Screening: Recommended annual dental exams for proper oral hygiene  Community Resource Referral / Chronic Care Management: CRR required this visit?  No   CCM required this visit?  No      Plan:     I have personally reviewed and noted the following in the patient's chart:   Medical and social history Use of alcohol, tobacco or illicit drugs  Current medications and supplements including opioid prescriptions. Patient is not currently taking opioid prescriptions. Functional ability and status Nutritional status Physical activity Advanced directives List of other physicians Hospitalizations, surgeries, and ER visits in previous 12 months Vitals Screenings to include cognitive, depression, and falls Referrals and appointments  In addition, I have reviewed and discussed with patient certain preventive protocols, quality metrics, and best practice recommendations. A written personalized care plan for preventive services as well as general preventive health recommendations were provided to patient.   Due to this being a telephonic visit, the after visit summary with patients personalized plan was offered to patient via mail or my-chart. Patient would like to access on my-chart.  Marta Antu, LPN   [id number]  Nurse Health Advisor  Nurse Notes: None

## 2022-06-04 NOTE — Patient Instructions (Signed)
Ms. Laidlaw , Thank you for taking time to complete your Medicare Wellness Visit. I appreciate your ongoing commitment to your health goals. Please review the following plan we discussed and let me know if I can assist you in the future.   Screening recommendations/referrals: Colonoscopy: Completed 12/21/2020-Due 12/22/2027 Mammogram: Declined Bone Density: Completed 06/08/2021-Due 06/09/2023 Recommended yearly ophthalmology/optometry visit for glaucoma screening and checkup Recommended yearly dental visit for hygiene and checkup  Vaccinations: Influenza vaccine: Due-May obtain vaccine at our office or your local pharmacy. Pneumococcal vaccine: Up to date Tdap vaccine: Up to date Shingles vaccine: Due-May obtain vaccine at your local pharmacy.   Covid-19:May obtain vaccine at your local pharmacy.    Advanced directives: Please bring a copy of Living Will and/or Healthcare Power of Attorney for your chart.   Conditions/risks identified: See problem list  Next appointment: Follow up in one year for your annual wellness visit    Preventive Care 65 Years and Older, Female Preventive care refers to lifestyle choices and visits with your health care provider that can promote health and wellness. What does preventive care include? A yearly physical exam. This is also called an annual well check. Dental exams once or twice a year. Routine eye exams. Ask your health care provider how often you should have your eyes checked. Personal lifestyle choices, including: Daily care of your teeth and gums. Regular physical activity. Eating a healthy diet. Avoiding tobacco and drug use. Limiting alcohol use. Practicing safe sex. Taking low-dose aspirin every day. Taking vitamin and mineral supplements as recommended by your health care provider. What happens during an annual well check? The services and screenings done by your health care provider during your annual well check will depend on your age,  overall health, lifestyle risk factors, and family history of disease. Counseling  Your health care provider may ask you questions about your: Alcohol use. Tobacco use. Drug use. Emotional well-being. Home and relationship well-being. Sexual activity. Eating habits. History of falls. Memory and ability to understand (cognition). Work and work Statistician. Reproductive health. Screening  You may have the following tests or measurements: Height, weight, and BMI. Blood pressure. Lipid and cholesterol levels. These may be checked every 5 years, or more frequently if you are over 3 years old. Skin check. Lung cancer screening. You may have this screening every year starting at age 34 if you have a 30-pack-year history of smoking and currently smoke or have quit within the past 15 years. Fecal occult blood test (FOBT) of the stool. You may have this test every year starting at age 51. Flexible sigmoidoscopy or colonoscopy. You may have a sigmoidoscopy every 5 years or a colonoscopy every 10 years starting at age 45. Hepatitis C blood test. Hepatitis B blood test. Sexually transmitted disease (STD) testing. Diabetes screening. This is done by checking your blood sugar (glucose) after you have not eaten for a while (fasting). You may have this done every 1-3 years. Bone density scan. This is done to screen for osteoporosis. You may have this done starting at age 53. Mammogram. This may be done every 1-2 years. Talk to your health care provider about how often you should have regular mammograms. Talk with your health care provider about your test results, treatment options, and if necessary, the need for more tests. Vaccines  Your health care provider may recommend certain vaccines, such as: Influenza vaccine. This is recommended every year. Tetanus, diphtheria, and acellular pertussis (Tdap, Td) vaccine. You may need a Td booster  every 10 years. Zoster vaccine. You may need this after age  65. Pneumococcal 13-valent conjugate (PCV13) vaccine. One dose is recommended after age 45. Pneumococcal polysaccharide (PPSV23) vaccine. One dose is recommended after age 3. Talk to your health care provider about which screenings and vaccines you need and how often you need them. This information is not intended to replace advice given to you by your health care provider. Make sure you discuss any questions you have with your health care provider. Document Released: 10/07/2015 Document Revised: 05/30/2016 Document Reviewed: 07/12/2015 Elsevier Interactive Patient Education  2017 [employer] Prevention in the Home Falls can cause injuries. They can happen to people of all ages. There are many things you can do to make your home safe and to help prevent falls. What can I do on the outside of my home? Regularly fix the edges of walkways and driveways and fix any cracks. Remove anything that might make you trip as you walk through a door, such as a raised step or threshold. Trim any bushes or trees on the path to your home. Use bright outdoor lighting. Clear any walking paths of anything that might make someone trip, such as rocks or tools. Regularly check to see if handrails are loose or broken. Make sure that both sides of any steps have handrails. Any raised decks and porches should have guardrails on the edges. Have any leaves, snow, or ice cleared regularly. Use sand or salt on walking paths during winter. Clean up any spills in your garage right away. This includes oil or grease spills. What can I do in the bathroom? Use night lights. Install grab bars by the toilet and in the tub and shower. Do not use towel bars as grab bars. Use non-skid mats or decals in the tub or shower. If you need to sit down in the shower, use a plastic, non-slip stool. Keep the floor dry. Clean up any water that spills on the floor as soon as it happens. Remove soap buildup in the tub or shower  regularly. Attach bath mats securely with double-sided non-slip rug tape. Do not have throw rugs and other things on the floor that can make you trip. What can I do in the bedroom? Use night lights. Make sure that you have a light by your bed that is easy to reach. Do not use any sheets or blankets that are too big for your bed. They should not hang down onto the floor. Have a firm chair that has side arms. You can use this for support while you get dressed. Do not have throw rugs and other things on the floor that can make you trip. What can I do in the kitchen? Clean up any spills right away. Avoid walking on wet floors. Keep items that you use a lot in easy-to-reach places. If you need to reach something above you, use a strong step stool that has a grab bar. Keep electrical cords out of the way. Do not use floor polish or wax that makes floors slippery. If you must use wax, use non-skid floor wax. Do not have throw rugs and other things on the floor that can make you trip. What can I do with my stairs? Do not leave any items on the stairs. Make sure that there are handrails on both sides of the stairs and use them. Fix handrails that are broken or loose. Make sure that handrails are as long as the stairways. Check any carpeting to  make sure that it is firmly attached to the stairs. Fix any carpet that is loose or worn. Avoid having throw rugs at the top or bottom of the stairs. If you do have throw rugs, attach them to the floor with carpet tape. Make sure that you have a light switch at the top of the stairs and the bottom of the stairs. If you do not have them, ask someone to add them for you. What else can I do to help prevent falls? Wear shoes that: Do not have high heels. Have rubber bottoms. Are comfortable and fit you well. Are closed at the toe. Do not wear sandals. If you use a stepladder: Make sure that it is fully opened. Do not climb a closed stepladder. Make sure that  both sides of the stepladder are locked into place. Ask someone to hold it for you, if possible. Clearly mark and make sure that you can see: Any grab bars or handrails. First and last steps. Where the edge of each step is. Use tools that help you move around (mobility aids) if they are needed. These include: Canes. Walkers. Scooters. Crutches. Turn on the lights when you go into a dark area. Replace any light bulbs as soon as they burn out. Set up your furniture so you have a clear path. Avoid moving your furniture around. If any of your floors are uneven, fix them. If there are any pets around you, be aware of where they are. Review your medicines with your doctor. Some medicines can make you feel dizzy. This can increase your chance of falling. Ask your doctor what other things that you can do to help prevent falls. This information is not intended to replace advice given to you by your health care provider. Make sure you discuss any questions you have with your health care provider. Document Released: 07/07/2009 Document Revised: 02/16/2016 Document Reviewed: 10/15/2014 Elsevier Interactive Patient Education  2017 [employer].

## 2022-06-04 NOTE — Telephone Encounter (Signed)
Patient is requesting refills of Amlodipine & Singulair. She wants them both sent to [facility].

## 2022-06-06 ENCOUNTER — Ambulatory Visit: Payer: Medicare HMO

## 2022-06-06 ENCOUNTER — Ambulatory Visit (INDEPENDENT_AMBULATORY_CARE_PROVIDER_SITE_OTHER): Payer: Medicare HMO | Admitting: *Deleted

## 2022-06-06 DIAGNOSIS — J309 Allergic rhinitis, unspecified: Secondary | ICD-10-CM

## 2022-06-06 DIAGNOSIS — R1313 Dysphagia, pharyngeal phase: Secondary | ICD-10-CM

## 2022-06-06 NOTE — Patient Instructions (Signed)
   Signs of Aspiration Pneumonia   Chest pain/tightness Fever (can be low grade) Cough  With foul-smelling phlegm (sputum) With sputum containing pus or blood With greenish sputum Fatigue  Shortness of breath  Wheezing   **IF YOU HAVE THESE SIGNS, CONTACT YOUR DOCTOR OR GO TO THE EMERGENCY DEPARTMENT OR URGENT CARE AS SOON AS POSSIBLE**     

## 2022-06-06 NOTE — Therapy (Signed)
[facility] OUTPATIENT SPEECH LANGUAGE PATHOLOGY SWALLOWING TREATMENT   Patient Name: Anne Barnes MRN: [id number] DOB:1945-11-12, 76 y.o., female Today's Date: 06/06/2022  PCP: Gwyneth Revels, MD REFERRING PROVIDER: Lavena Bullion, MD:    End of Session - 06/06/22 1540     Visit Number 4    Number of Visits 12    Date for SLP Re-Evaluation 07/24/22    SLP Start Time 1535    SLP Stop Time  1608    SLP Time Calculation (min) 33 min    Activity Tolerance Patient tolerated treatment well               Past Medical History:  Diagnosis Date   Acute combined systolic and diastolic heart failure ([place]) 05/26/2015   ALLERGIC RHINITIS    Allergic urticaria 08/01/2014   Patient reports allergies to shrimp, egg whites, tomatoes, dairy    Allergy    seasonal and numerous food and drug allergies.   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   Office Spirometry 11/07/17-WNL-FVC 1.58/82%, FEV1 1.27/85%, ratio 0.80, FEF 25-75% 0.26/89%   Bradycardia 02/25/2017   Chiari malformation    Noted MRI brain 09/2013 - s/p neuro eval for same   DIVERTICULOSIS, COLON    Essential hypertension 08/28/2010   Qualifier: Diagnosis of  By: Marca Ancona RMA, Lucy      Frequent headaches    GERD    H/O measles    H/O mumps    Hearing loss 01/18/2017   History of chicken pox    Hyperglycemia 03/05/2016   HYPERTENSION    Hypocalcemia 02/25/2017   IBS (irritable bowel syndrome) 05/26/2015   Nonspecific abnormal electrocardiogram (ECG) (EKG)    OBSTRUCTIVE SLEEP APNEA 12/2008 dx   noncompliant with CPAP qhs   Obstructive sleep apnea 08/28/2010   NPSG 12/2008:  AHI 13/hr with desats to 78%    Preventative health care 06/11/2017   Sciatica    right side   Seasonal and perennial allergic rhinitis 08/28/2010   Allergy profile 03/11/14- positive especially for dust, cat and dog Food allergy profile- total IgE 86 with several food group elevations Sed rate-WNL      Sinusitis 06/06/2017   Sleep apnea    uses cpap   Tinnitus of right  ear 03/14/2014   Vaginitis 01/18/2017   Vitamin D deficiency 06/11/2017   Past Surgical History:  Procedure Laterality Date   [place]   Breast biopsy   COLONOSCOPY  2011   Dr Collene Mares   UMBILICAL HERNIA REPAIR     Patient Active Problem List   Diagnosis Date Noted   Neck pain on left side 12/13/2021   Congestion of throat 12/13/2021   Osteoporosis 12/13/2021   COVID 04/20/2021   Chest pain of uncertain etiology 61/95/0932   Allergies 12/22/2019   Dizziness 11/19/2019   Grief reaction 08/27/2019   Left shoulder pain 08/27/2019   Hyperlipidemia 07/14/2018   Asthma, mild intermittent 06/06/2018   Morbid obesity ([place]) 01/09/2018   Vitamin D deficiency 06/11/2017   Preventative health care 06/11/2017   Hypocalcemia 02/25/2017   Bradycardia 02/25/2017   Hearing loss 01/18/2017   Hyperglycemia 03/05/2016   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   CHF (congestive heart failure) ([place]) 05/26/2015   Female bladder prolapse 05/26/2015   IBS (irritable bowel syndrome) 05/26/2015   History of chicken pox    Allergic urticaria 08/01/2014   Food allergy 08/01/2014   Tinnitus 03/14/2014   Osteoarthritis    Nonspecific abnormal electrocardiogram (  ECG) (EKG)    Headache 10/21/2013   Chiari malformation    GERD 11/10/2010   Obstructive sleep apnea 08/28/2010   Essential hypertension 08/28/2010   Seasonal and perennial allergic rhinitis 08/28/2010   DIVERTICULOSIS, COLON 08/28/2010    ONSET DATE: "Two years ago"   REFERRING DIAG: R13.10 (ICD-10-CM) - Dysphagia, unspecified type  THERAPY DIAG:  Dysphagia, pharyngeal phase  Rationale for Evaluation and Treatment Rehabilitation  SUBJECTIVE:   SUBJECTIVE STATEMENT: Pt stated her swallowing appears different/better than a few weeks ago. Pt accompanied by: self  PERTINENT HISTORY: Anne Barnes is a 76 y.o. 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 Per 04/27/22 GI note, patient reporting her reflux symptoms started in late 2022 and included heartburn, intermittent chest pain lasting up to a few hours, increased belching, can have associated dyspepsia. She was started on Prilosec 40 mg for four weeks with plan to titrate to lowes effective dose; EGD recommended but has not been completed.   PAIN:  Are you having pain? No   PATIENT GOALS  Regain normal swallow function  OBJECTIVE:   DIAGNOSTIC FINDINGS:  MRI HEAD WITHOUT CONTRAST  01/28/20 TECHNIQUE: Multiplanar, multiecho pulse sequences of the brain and surrounding structures were obtained without intravenous contrast. COMPARISON:  Head CT November 24, 2019. FINDINGS: Brain: No acute infarction, hemorrhage, hydrocephalus, extra-axial collection or mass lesion. Few scattered foci of T2 hyperintensity are seen within the white matter of the cerebral hemispheres, nonspecific. Prominence of the cerebral sulci in the bilateral parietal regions and prominence of the supratentorial ventricles which has progressed since prior MRI performed in September 30, 2013, but is stable from prior CT. 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 Vascular: Normal flow voids. Skull and upper cervical spine: Normal marrow signal. Sinuses/Orbits: Mild mucosal thickening of the ethmoid cells. The orbits are maintained. IMPRESSION: 1. No acute intracranial abnormality. 2. Prominence of the cerebral sulci in the bilateral parietal regions and prominence of the supratentorial ventricles which has progressed since prior MRI performed in September 30, 2013, but is stable from prior CT performed in September 30, 2013. 3. Chiari 1 malformation.  RESULTS FROM OBJECTIVE SWALLOW STUDY (MBSS/FEES):   [name] presents with a normal oral phase of swallow but a moderately impaired pharyngeal phase of swallow. During pharyngeal  phase she exhibited swallow initiation delay with thin liquids and at times with nectar thick liquids. She exhibited consistent aspiration during the swallow with cup sips of thin liquids and straw sips of nectar thick liquids. Aspiration occured without patient sensing until after aspirate had transited past vocal cords. She did then sense aspiration with a cough but this did not aid in expelling aspirate. When liquids were transiting, portion of liquid bolus moves under epiglottis before it comes into full contact to protect airway. She did not exhibit any aspiration when taking small sips of nectar thick liquids. No significant difficulties with puree, regular, 42m barium tablet or honey thick liquids. Aspiration appears to be impacted by reduced epiglottic deflection as pharyngeal space is more narrow (?pharyngeal edema) and cannot r/o impact from appearance of prominent cricopharyngeal bar.  When liquids transiting, portion of liquid bolus moves under epiglottis before it comes into full contact to protect airway. SLP is recommending nectar thick liquids and continue with solids as she has been. Ms SCarnegiewas given a sample box of Simply Thick food thickener (nectar consistency). SLP recommending OP  SLP for dysphagia and patient's PCP contacted via secure EPIC message.     PATIENT REPORTED OUTCOME MEASURES (PROM): EAT-10: completed today with pt. Score was 12. "4" reported with #2 (ability to go out for meals), and #3 (swallowing liquids takes extra effort); "2" reported with #9 (coughing when I eat/drink) and #10 (swallowing is stressful).    TODAY'S TREATMENT:  06/06/22: Pt req'd min A initially for [place] but was independent after x6 reps. Otherwise pt was modified independent with HEP. Pt stated, "I know this is helping, my swallowing is different and better." Pt told SLP rationale for nectar liquids correctly.  05/30/22: SLP explained to pt that when she is modified independent with HEP we can  decr frequency to once/week. Today pt req'd min-mod A for 2/3 exercises, faded to rare min A. She will have daughter attend next session to give her assistance if she requires it. She told SLP rationale for nectar liquids - uses this for all liquids at home.   05/29/22: Pt states effortful swallow has been the most challenging exercise thus far but there is a limited sample size as pt just begun performing her HEP. Today, she filled out EAT-10 with result above. HEP was completed with SLP guiding pt through her exercises - she req'd mod A for all exercises but was modified independent by session end.   05-22-22: Discussed MBS results, educated and observed pt with dysphagia HEP - pt was total A faded to modified independence.    PATIENT EDUCATION: Education details: see above in "today's treatment" Person educated: Patient Education method: Explanation, Demonstration, Verbal cues, and Handouts Education comprehension: verbalized understanding, returned demonstration, verbal cues required, and needs further education   ASSESSMENT:  CLINICAL IMPRESSION: Patient is a 76 y.o. female who was seen today for swallowing therapy after a MBS on 05/16/22.Pt without notable neurological history, so SLP suspects pharyngeal dysphagia has a deconditioning component. There is also question esophageal component - pt undergoing EGD procedure 07-21-22. Pt is highly motivated. SLP will assist pt with dysphagia HEP to strengthen pharyngeal musculature and may suggest repeat of MBS in 8-10 weeks. She cont to require min A with some component of HEP each session- when pt is SBA/modified independent frequency can decr to once/week.  OBJECTIVE IMPAIRMENTS include dysphagia. These impairments are limiting patient from safety when swallowing. Factors affecting potential to achieve goals and functional outcome are co-morbidities (esophageal component?). Patient will benefit from skilled SLP services to address above impairments  and improve overall function.  REHAB POTENTIAL: Good   GOALS: Goals reviewed with patient? Yes  SHORT TERM GOALS: Target date: 06/19/2022    Pt will perform HEP with rare min A in 2 sessions Baseline: 06-06-22 Goal status: Onoing  2.  Pt will tell SLP 3 overt s/s aspiration PNA with modified independence in two sessions Baseline: 06/06/22,  Goal status: Ongoing  3.  Pt will tell SLP rationale for nectar liquids in two sessions Baseline: 05/30/22 Goal status: Met   LONG TERM GOALS: Target date: 07/24/2022    Pt will perform HEP with modified independence in 2 sessions Baseline:  Goal status: Onoing   PLAN: SLP FREQUENCY: 2x/week x4 weeks, then x1/week x2 weeks  SLP DURATION: 8 weeks  PLANNED INTERVENTIONS: Aspiration precaution training, Pharyngeal strengthening exercises, Diet toleration management , Trials of upgraded texture/liquids, Internal/external aids, and Functional tasks    [facility], [place] 06/06/2022, 5:29 PM

## 2022-06-08 ENCOUNTER — Ambulatory Visit: Payer: Medicare HMO

## 2022-06-11 ENCOUNTER — Ambulatory Visit: Payer: Medicare HMO

## 2022-06-11 DIAGNOSIS — J3089 Other allergic rhinitis: Secondary | ICD-10-CM | POA: Diagnosis not present

## 2022-06-11 DIAGNOSIS — R1313 Dysphagia, pharyngeal phase: Secondary | ICD-10-CM | POA: Diagnosis not present

## 2022-06-11 NOTE — Therapy (Signed)
Marland Kitchen OUTPATIENT SPEECH LANGUAGE PATHOLOGY SWALLOWING TREATMENT   Patient Name: Anne Barnes MRN: [id number] DOB:07-20-1946, 76 y.o., female Today's Date: 06/11/2022  PCP: Gwyneth Revels, MD REFERRING PROVIDER: Lavena Bullion, MD:    End of Session - 06/11/22 1347     Visit Number 5    Number of Visits 12    Date for SLP Re-Evaluation 07/24/22    SLP Start Time 1104    SLP Stop Time  1140    SLP Time Calculation (min) 36 min    Activity Tolerance Patient tolerated treatment well                Past Medical History:  Diagnosis Date   Acute combined systolic and diastolic heart failure ([place]) 05/26/2015   ALLERGIC RHINITIS    Allergic urticaria 08/01/2014   Patient reports allergies to shrimp, egg whites, tomatoes, dairy    Allergy    seasonal and numerous food and drug allergies.   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   Office Spirometry 11/07/17-WNL-FVC 1.58/82%, FEV1 1.27/85%, ratio 0.80, FEF 25-75% 0.26/89%   Bradycardia 02/25/2017   Chiari malformation    Noted MRI brain 09/2013 - s/p neuro eval for same   DIVERTICULOSIS, COLON    Essential hypertension 08/28/2010   Qualifier: Diagnosis of  By: Marca Ancona RMA, Lucy      Frequent headaches    GERD    H/O measles    H/O mumps    Hearing loss 01/18/2017   History of chicken pox    Hyperglycemia 03/05/2016   HYPERTENSION    Hypocalcemia 02/25/2017   IBS (irritable bowel syndrome) 05/26/2015   Nonspecific abnormal electrocardiogram (ECG) (EKG)    OBSTRUCTIVE SLEEP APNEA 12/2008 dx   noncompliant with CPAP qhs   Obstructive sleep apnea 08/28/2010   NPSG 12/2008:  AHI 13/hr with desats to 78%    Preventative health care 06/11/2017   Sciatica    right side   Seasonal and perennial allergic rhinitis 08/28/2010   Allergy profile 03/11/14- positive especially for dust, cat and dog Food allergy profile- total IgE 86 with several food group elevations Sed rate-WNL      Sinusitis 06/06/2017   Sleep apnea    uses cpap   Tinnitus of right  ear 03/14/2014   Vaginitis 01/18/2017   Vitamin D deficiency 06/11/2017   Past Surgical History:  Procedure Laterality Date   [place]   Breast biopsy   COLONOSCOPY  2011   Dr Collene Mares   UMBILICAL HERNIA REPAIR     Patient Active Problem List   Diagnosis Date Noted   Neck pain on left side 12/13/2021   Congestion of throat 12/13/2021   Osteoporosis 12/13/2021   COVID 04/20/2021   Chest pain of uncertain etiology 54/56/2563   Allergies 12/22/2019   Dizziness 11/19/2019   Grief reaction 08/27/2019   Left shoulder pain 08/27/2019   Hyperlipidemia 07/14/2018   Asthma, mild intermittent 06/06/2018   Morbid obesity ([place]) 01/09/2018   Vitamin D deficiency 06/11/2017   Preventative health care 06/11/2017   Hypocalcemia 02/25/2017   Bradycardia 02/25/2017   Hearing loss 01/18/2017   Hyperglycemia 03/05/2016   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   CHF (congestive heart failure) ([place]) 05/26/2015   Female bladder prolapse 05/26/2015   IBS (irritable bowel syndrome) 05/26/2015   History of chicken pox    Allergic urticaria 08/01/2014   Food allergy 08/01/2014   Tinnitus 03/14/2014   Osteoarthritis    Nonspecific abnormal  electrocardiogram (ECG) (EKG)    Headache 10/21/2013   Chiari malformation    GERD 11/10/2010   Obstructive sleep apnea 08/28/2010   Essential hypertension 08/28/2010   Seasonal and perennial allergic rhinitis 08/28/2010   DIVERTICULOSIS, COLON 08/28/2010    ONSET DATE: "Two years ago"   REFERRING DIAG: R13.10 (ICD-10-CM) - Dysphagia, unspecified type  THERAPY DIAG:  Dysphagia, pharyngeal phase  Rationale for Evaluation and Treatment Rehabilitation  SUBJECTIVE:   SUBJECTIVE STATEMENT: Pt stated her swallowing appears different/better than a few weeks ago. Pt accompanied by: self  PERTINENT HISTORY: Anne Barnes is a 76 y.o. 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 Per 04/27/22 GI note, patient reporting her reflux symptoms started in late 2022 and included heartburn, intermittent chest pain lasting up to a few hours, increased belching, can have associated dyspepsia. She was started on Prilosec 40 mg for four weeks with plan to titrate to lowes effective dose; EGD recommended but has not been completed.   PAIN:  Are you having pain? No   PATIENT GOALS  Regain normal swallow function  OBJECTIVE:   DIAGNOSTIC FINDINGS:  MRI HEAD WITHOUT CONTRAST  01/28/20 TECHNIQUE: Multiplanar, multiecho pulse sequences of the brain and surrounding structures were obtained without intravenous contrast. COMPARISON:  Head CT November 24, 2019. FINDINGS: Brain: No acute infarction, hemorrhage, hydrocephalus, extra-axial collection or mass lesion. Few scattered foci of T2 hyperintensity are seen within the white matter of the cerebral hemispheres, nonspecific. Prominence of the cerebral sulci in the bilateral parietal regions and prominence of the supratentorial ventricles which has progressed since prior MRI performed in September 30, 2013, but is stable from prior CT. 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 Vascular: Normal flow voids. Skull and upper cervical spine: Normal marrow signal. Sinuses/Orbits: Mild mucosal thickening of the ethmoid cells. The orbits are maintained. IMPRESSION: 1. No acute intracranial abnormality. 2. Prominence of the cerebral sulci in the bilateral parietal regions and prominence of the supratentorial ventricles which has progressed since prior MRI performed in September 30, 2013, but is stable from prior CT performed in September 30, 2013. 3. Chiari 1 malformation.  RESULTS FROM OBJECTIVE SWALLOW STUDY (MBSS/FEES):   Anne Barnes presents with a normal oral phase of swallow but a moderately impaired pharyngeal phase of swallow. During pharyngeal  phase she exhibited swallow initiation delay with thin liquids and at times with nectar thick liquids. She exhibited consistent aspiration during the swallow with cup sips of thin liquids and straw sips of nectar thick liquids. Aspiration occured without patient sensing until after aspirate had transited past vocal cords. She did then sense aspiration with a cough but this did not aid in expelling aspirate. When liquids were transiting, portion of liquid bolus moves under epiglottis before it comes into full contact to protect airway. She did not exhibit any aspiration when taking small sips of nectar thick liquids. No significant difficulties with puree, regular, 97mm barium tablet or honey thick liquids. Aspiration appears to be impacted by reduced epiglottic deflection as pharyngeal space is more narrow (?pharyngeal edema) and cannot r/o impact from appearance of prominent cricopharyngeal bar.  When liquids transiting, portion of liquid bolus moves under epiglottis before it comes into full contact to protect airway. SLP is recommending nectar thick liquids and continue with solids as she has been. Ms Brave was given a sample box of Simply Thick food thickener (nectar consistency). SLP recommending  OP SLP for dysphagia and patient's PCP contacted via secure EPIC message.     PATIENT REPORTED OUTCOME MEASURES (PROM): EAT-10: completed today with pt. Score was 12. "4" reported with #2 (ability to go out for meals), and #3 (swallowing liquids takes extra effort); "2" reported with #9 (coughing when I eat/drink) and #10 (swallowing is stressful).    TODAY'S TREATMENT:  06/08/22: Pt again req'd rare min A for HEP procedure but was modified independent by session end. Often she stated that the rep was not good but it was WNL with slight modification (e.g., air escape on supraglottic). Anne Barnes told SLP she was primarily performing [place] and supraglottic and SLP encouraged her to perform all.   06/06/22: Pt  req'd min A initially for [facility] but was independent after x6 reps. Otherwise pt was modified independent with HEP. Pt stated, "I know this is helping, my swallowing is different and better." Pt told SLP rationale for nectar liquids correctly.  05/30/22: SLP explained to pt that when she is modified independent with HEP we can decr frequency to once/week. Today pt req'd min-mod A for 2/3 exercises, faded to rare min A. She will have daughter attend next session to give her assistance if she requires it. She told SLP rationale for nectar liquids - uses this for all liquids at home.   05/29/22: Pt states effortful swallow has been the most challenging exercise thus far but there is a limited sample size as pt just begun performing her HEP. Today, she filled out EAT-10 with result above. HEP was completed with SLP guiding pt through her exercises - she req'd mod A for all exercises but was modified independent by session end.    PATIENT EDUCATION: Education details: see above in "today's treatment" Person educated: Patient Education method: Explanation, Demonstration, Verbal cues, and Handouts Education comprehension: verbalized understanding, returned demonstration, verbal cues required, and needs further education   ASSESSMENT:  CLINICAL IMPRESSION: Patient met remainder of STGs today. She is a 77 y.o. female who was seen today for swallowing therapy after a MBS on 05/16/22.Pt without notable neurological history, so SLP suspects pharyngeal dysphagia has a deconditioning component. There is also question esophageal component - pt undergoing EGD procedure 07-21-22. Pt continues as highly motivated. SLP will assist pt with dysphagia HEP to strengthen pharyngeal musculature and may suggest repeat of MBS in 8-10 weeks. Today Anne Barnes needed rare min A with HEP- when pt is SBA/modified independent frequency can decr to once/week.  OBJECTIVE IMPAIRMENTS include dysphagia. These impairments are limiting  patient from safety when swallowing. Factors affecting potential to achieve goals and functional outcome are co-morbidities (esophageal component?). Patient will benefit from skilled SLP services to address above impairments and improve overall function.  REHAB POTENTIAL: Good   GOALS: Goals reviewed with patient? Yes  SHORT TERM GOALS: Target date: 06/19/2022    Pt will perform HEP with rare min A in 2 sessions Baseline: 06-06-22 Goal status: Met  2.  Pt will tell SLP 3 overt s/s aspiration PNA with modified independence in two sessions Baseline: 06/06/22, 06-11-22 Goal status: Met  3.  Pt will tell SLP rationale for nectar liquids in two sessions Baseline: 05/30/22 Goal status: Met   LONG TERM GOALS: Target date: 07/24/2022    Pt will perform HEP with modified independence in 2 sessions Baseline:  Goal status: Onoing   PLAN: SLP FREQUENCY: 2x/week x4 weeks, then x1/week x2 weeks  SLP DURATION: 8 weeks  PLANNED INTERVENTIONS: Aspiration precaution training, Pharyngeal strengthening exercises,  Diet toleration management , Trials of upgraded texture/liquids, Internal/external aids, and Functional tasks    [facility], [place] 06/11/2022, 1:47 PM

## 2022-06-11 NOTE — Patient Instructions (Addendum)
Do these twice a day (think of getting in 20 each day)   Effortful swallow Press your tongue hard for 3 seconds and swallow as hard as you can 10 times  [place]el[place]hn  Swallow and squeeze tight for 5 seconds 10 times  Super Swallow Take a breath-hold it (Drop of water) Press down Swallow and cough 10 times

## 2022-06-12 NOTE — Progress Notes (Signed)
VIAL EXP 06-13-23

## 2022-06-13 ENCOUNTER — Ambulatory Visit: Payer: Medicare HMO

## 2022-06-13 DIAGNOSIS — R1313 Dysphagia, pharyngeal phase: Secondary | ICD-10-CM

## 2022-06-13 NOTE — Therapy (Signed)
Marland Kitchen OUTPATIENT SPEECH LANGUAGE PATHOLOGY SWALLOWING TREATMENT   Patient Name: Anne Barnes MRN: [id number] DOB:[birth year], 76 y.o., female Today's Date: 06/13/2022  PCP: Gwyneth Revels, MD REFERRING PROVIDER: Lavena Bullion, MD:    End of Session - 06/13/22 1150     Visit Number 6    Number of Visits 12    Date for SLP Re-Evaluation 07/24/22    SLP Start Time 1105    SLP Stop Time  52    SLP Time Calculation (min) 25 min    Activity Tolerance Patient tolerated treatment well                 Past Medical History:  Diagnosis Date   Acute combined systolic and diastolic heart failure ([place]) 05/26/2015   ALLERGIC RHINITIS    Allergic urticaria 08/01/2014   Patient reports allergies to shrimp, egg whites, tomatoes, dairy    Allergy    seasonal and numerous food and drug allergies.   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   Office Spirometry 11/07/17-WNL-FVC 1.58/82%, FEV1 1.27/85%, ratio 0.80, FEF 25-75% 0.26/89%   Bradycardia 02/25/2017   Chiari malformation    Noted MRI brain 09/2013 - s/p neuro eval for same   DIVERTICULOSIS, COLON    Essential hypertension 08/28/2010   Qualifier: Diagnosis of  By: Marca Ancona RMA, Lucy      Frequent headaches    GERD    H/O measles    H/O mumps    Hearing loss 01/18/2017   History of chicken pox    Hyperglycemia 03/05/2016   HYPERTENSION    Hypocalcemia 02/25/2017   IBS (irritable bowel syndrome) 05/26/2015   Nonspecific abnormal electrocardiogram (ECG) (EKG)    OBSTRUCTIVE SLEEP APNEA 12/2008 dx   noncompliant with CPAP qhs   Obstructive sleep apnea 08/28/2010   NPSG 12/2008:  AHI 13/hr with desats to 78%    Preventative health care 06/11/2017   Sciatica    right side   Seasonal and perennial allergic rhinitis 08/28/2010   Allergy profile 03/11/14- positive especially for dust, cat and dog Food allergy profile- total IgE 86 with several food group elevations Sed rate-WNL      Sinusitis 06/06/2017   Sleep apnea    uses cpap   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   Vaginitis 01/18/2017   Vitamin D deficiency 06/11/2017   Past Surgical History:  Procedure Laterality Date   [place]   Breast biopsy   COLONOSCOPY  2011   Dr Collene Mares   UMBILICAL HERNIA REPAIR     Patient Active Problem List   Diagnosis Date Noted   Neck pain on left side 12/13/2021   Congestion of throat 12/13/2021   Osteoporosis 12/13/2021   COVID 04/20/2021   Chest pain of uncertain etiology 63/87/5643   Allergies 12/22/2019   Dizziness 11/19/2019   Grief reaction 08/27/2019   Left shoulder pain 08/27/2019   Hyperlipidemia 07/14/2018   Asthma, mild intermittent 06/06/2018   Morbid obesity ([place]) 01/09/2018   Vitamin D deficiency 06/11/2017   Preventative health care 06/11/2017   Hypocalcemia 02/25/2017   Bradycardia 02/25/2017   Hearing loss 01/18/2017   Hyperglycemia 03/05/2016   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   CHF (congestive heart failure) ([place]) 05/26/2015   Female bladder prolapse 05/26/2015   IBS (irritable bowel syndrome) 05/26/2015   History of chicken pox    Allergic urticaria 08/01/2014   Food allergy 08/01/2014   Tinnitus 03/14/2014   Osteoarthritis    Nonspecific  abnormal electrocardiogram (ECG) (EKG)    Headache 10/21/2013   Chiari malformation    GERD 11/10/2010   Obstructive sleep apnea 08/28/2010   Essential hypertension 08/28/2010   Seasonal and perennial allergic rhinitis 08/28/2010   DIVERTICULOSIS, COLON 08/28/2010    ONSET DATE: "Two years ago"   REFERRING DIAG: R13.10 (ICD-10-CM) - Dysphagia, unspecified type  THERAPY DIAG:  Dysphagia, pharyngeal phase  Rationale for Evaluation and Treatment Rehabilitation  SUBJECTIVE:   SUBJECTIVE STATEMENT: (At 1129) "Would you think it OK if I said I have a headache and just wanted to go home and rest?" Pt accompanied by: self  PERTINENT HISTORY: Anne Barnes is a 76 y.o. female with PMH: allergic rhinitis, mild asthma, OSA,  CHF, nonobstructive CAD on coronary CTA, HTN, obesity, bradycardia, chiari malformation, GERD followed by GI. Per 04/27/22 GI note, patient reporting her reflux symptoms started in late 2022 and included heartburn, intermittent chest pain lasting up to a few hours, increased belching, can have associated dyspepsia. She was started on Prilosec 40 mg for four weeks with plan to titrate to lowes effective dose; EGD recommended but has not been completed.   PAIN:  Are you having pain? No   PATIENT GOALS  Regain normal swallow function  OBJECTIVE:   DIAGNOSTIC FINDINGS:  MRI HEAD WITHOUT CONTRAST  01/28/20 TECHNIQUE: Multiplanar, multiecho pulse sequences of the brain and surrounding structures were obtained without intravenous contrast. COMPARISON:  Head CT November 24, 2019. FINDINGS: Brain: No acute infarction, hemorrhage, hydrocephalus, extra-axial collection or mass lesion. Few scattered foci of T2 hyperintensity are seen within the white matter of the cerebral hemispheres, nonspecific. Prominence of the cerebral sulci in the bilateral parietal regions and prominence of the supratentorial ventricles which has progressed since prior MRI performed in September 30, 2013, but is stable from prior CT. 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 Vascular: Normal flow voids. Skull and upper cervical spine: Normal marrow signal. Sinuses/Orbits: Mild mucosal thickening of the ethmoid cells. The orbits are maintained. IMPRESSION: 1. No acute intracranial abnormality. 2. Prominence of the cerebral sulci in the bilateral parietal regions and prominence of the supratentorial ventricles which has progressed since prior MRI performed in September 30, 2013, but is stable from prior CT performed in September 30, 2013. 3. Chiari 1 malformation.  RESULTS FROM OBJECTIVE SWALLOW STUDY (MBSS/FEES):   Anne Barnes presents with a normal oral phase of swallow but a moderately impaired pharyngeal  phase of swallow. During pharyngeal phase she exhibited swallow initiation delay with thin liquids and at times with nectar thick liquids. She exhibited consistent aspiration during the swallow with cup sips of thin liquids and straw sips of nectar thick liquids. Aspiration occured without patient sensing until after aspirate had transited past vocal cords. She did then sense aspiration with a cough but this did not aid in expelling aspirate. When liquids were transiting, portion of liquid bolus moves under epiglottis before it comes into full contact to protect airway. She did not exhibit any aspiration when taking small sips of nectar thick liquids. No significant difficulties with puree, regular, 69m barium tablet or honey thick liquids. Aspiration appears to be impacted by reduced epiglottic deflection as pharyngeal space is more narrow (?pharyngeal edema) and cannot r/o impact from appearance of prominent cricopharyngeal bar.  When liquids transiting, portion of liquid bolus moves under epiglottis before it comes into full contact to protect airway. SLP is recommending nectar thick liquids and continue with solids as she has been. Anne Barnes given  a sample box of Simply Thick food thickener (nectar consistency). SLP recommending OP SLP for dysphagia and patient's PCP contacted via secure EPIC message.     PATIENT REPORTED OUTCOME MEASURES (PROM): EAT-10: completed with pt. Score was 12. "4" reported with #2 (ability to go out for meals), and #3 (swallowing liquids takes extra effort); "2" reported with #9 (coughing when I eat/drink) and #10 (swallowing is stressful).    TODAY'S TREATMENT:  06/13/22: Pt reports "some mornings I just wake up very mucousy. Today I am." SLP hears lung/pharyngeal congestion today. Anne Barnes reports hypersensitivity in her throat and nose/lungs is evident when this occurs. Coughing with 65% of Mendelsohn. "It's not because it's going down the wrong way, it's because of that  mucous." Pt performed HEP correctly 95% of the time but cont'd to say, "That wasn't right" for approx 25% of her attempts. SLP cont'd to encourage pt that her procedure was correct 95% of the time, and she corrected herself with SLP A when she was incorrect.   06/08/22: Pt again req'd rare min A for HEP procedure but was modified independent by session end. Often she stated that the rep was not good but it was WNL with slight modification (e.g., air escape on supraglottic). Anne Barnes told SLP she was primarily performing [place] and supraglottic and SLP encouraged her to perform all.   06/06/22: Pt req'd min A initially for [facility] but was independent after x6 reps. Otherwise pt was modified independent with HEP. Pt stated, "I know this is helping, my swallowing is different and better." Pt told SLP rationale for nectar liquids correctly.  05/30/22: SLP explained to pt that when she is modified independent with HEP we can decr frequency to once/week. Today pt req'd min-mod A for 2/3 exercises, faded to rare min A. She will have daughter attend next session to give her assistance if she requires it. She told SLP rationale for nectar liquids - uses this for all liquids at home.   05/29/22: Pt states effortful swallow has been the most challenging exercise thus far but there is a limited sample size as pt just begun performing her HEP. Today, she filled out EAT-10 with result above. HEP was completed with SLP guiding pt through her exercises - she req'd mod A for all exercises but was modified independent by session end.    PATIENT EDUCATION: Education details: see above in "today's treatment" Person educated: Patient Education method: Explanation, Demonstration, Verbal cues, and Handouts Education comprehension: verbalized understanding, returned demonstration, verbal cues required, and needs further education   ASSESSMENT:  CLINICAL IMPRESSION: Patient was seen today for swallowing therapy after a  MBS on 05/16/22. Pt without notable neurological history, so SLP suspects pharyngeal dysphagia has a deconditioning component. There is also question esophageal component - pt undergoing EGD procedure 07-21-22. Pt continues as highly motivated. SLP will assist pt with dysphagia HEP to strengthen pharyngeal musculature and may suggest repeat of MBS beginning of November 2023. Today Anne Barnes needed rare min A with HEP- when pt is SBA/modified independent frequency can decr to once/week.  OBJECTIVE IMPAIRMENTS include dysphagia. These impairments are limiting patient from safety when swallowing. Factors affecting potential to achieve goals and functional outcome are co-morbidities (esophageal component?). Patient will benefit from skilled SLP services to address above impairments and improve overall function.  REHAB POTENTIAL: Good   GOALS: Goals reviewed with patient? Yes  SHORT TERM GOALS: Target date: 06/19/2022    Pt will perform HEP with rare min A in 2 sessions  Baseline: 06-06-22 Goal status: Met  2.  Pt will tell SLP 3 overt s/s aspiration PNA with modified independence in two sessions Baseline: 06/06/22, 06-11-22 Goal status: Met  3.  Pt will tell SLP rationale for nectar liquids in two sessions Baseline: 05/30/22 Goal status: Met   LONG TERM GOALS: Target date: 07/24/2022    Pt will perform HEP with modified independence in 2 sessions Baseline:  Goal status: Onoing   PLAN: SLP FREQUENCY: 2x/week x4 weeks, then x1/week x2 weeks  SLP DURATION: 8 weeks  PLANNED INTERVENTIONS: Aspiration precaution training, Pharyngeal strengthening exercises, Diet toleration management , Trials of upgraded texture/liquids, Internal/external aids, and Functional tasks    [facility], [place] 06/13/2022, 12:08 PM

## 2022-06-18 ENCOUNTER — Other Ambulatory Visit: Payer: Self-pay | Admitting: Gastroenterology

## 2022-06-18 ENCOUNTER — Ambulatory Visit: Payer: Medicare HMO

## 2022-06-20 ENCOUNTER — Ambulatory Visit: Payer: Medicare HMO

## 2022-06-20 ENCOUNTER — Other Ambulatory Visit: Payer: Self-pay | Admitting: Internal Medicine

## 2022-06-20 DIAGNOSIS — R1313 Dysphagia, pharyngeal phase: Secondary | ICD-10-CM

## 2022-06-20 NOTE — Therapy (Signed)
[facility] Kitchen OUTPATIENT SPEECH LANGUAGE PATHOLOGY SWALLOWING TREATMENT   Patient Name: Anne Barnes MRN: [id number] DOB:Jul 19, 1946, 76 y.o., female Today's Date: 06/20/2022  PCP: Gwyneth Revels, MD REFERRING PROVIDER: Lavena Bullion, MD:    End of Session - 06/20/22 1049     Visit Number 7    Number of Visits 12    Date for SLP Re-Evaluation 07/24/22    SLP Start Time 105    SLP Stop Time  0102    SLP Time Calculation (min) 25 min    Activity Tolerance Patient tolerated treatment well                 Past Medical History:  Diagnosis Date   Acute combined systolic and diastolic heart failure ([place]) 05/26/2015   ALLERGIC RHINITIS    Allergic urticaria 08/01/2014   Patient reports allergies to shrimp, egg whites, tomatoes, dairy    Allergy    seasonal and numerous food and drug allergies.   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   Office Spirometry 11/07/17-WNL-FVC 1.58/82%, FEV1 1.27/85%, ratio 0.80, FEF 25-75% 0.26/89%   Bradycardia 02/25/2017   Chiari malformation    Noted MRI brain 09/2013 - s/p neuro eval for same   DIVERTICULOSIS, COLON    Essential hypertension 08/28/2010   Qualifier: Diagnosis of  By: Marca Ancona RMA, Lucy      Frequent headaches    GERD    H/O measles    H/O mumps    Hearing loss 01/18/2017   History of chicken pox    Hyperglycemia 03/05/2016   HYPERTENSION    Hypocalcemia 02/25/2017   IBS (irritable bowel syndrome) 05/26/2015   Nonspecific abnormal electrocardiogram (ECG) (EKG)    OBSTRUCTIVE SLEEP APNEA 12/2008 dx   noncompliant with CPAP qhs   Obstructive sleep apnea 08/28/2010   NPSG 12/2008:  AHI 13/hr with desats to 78%    Preventative health care 06/11/2017   Sciatica    right side   Seasonal and perennial allergic rhinitis 08/28/2010   Allergy profile 03/11/14- positive especially for dust, cat and dog Food allergy profile- total IgE 86 with several food group elevations Sed rate-WNL      Sinusitis 06/06/2017   Sleep apnea    uses cpap   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   Vaginitis 01/18/2017   Vitamin D deficiency 06/11/2017   Past Surgical History:  Procedure Laterality Date   [place]   Breast biopsy   COLONOSCOPY  2011   Dr Collene Mares   UMBILICAL HERNIA REPAIR     Patient Active Problem List   Diagnosis Date Noted   Neck pain on left side 12/13/2021   Congestion of throat 12/13/2021   Osteoporosis 12/13/2021   COVID 04/20/2021   Chest pain of uncertain etiology 72/53/6644   Allergies 12/22/2019   Dizziness 11/19/2019   Grief reaction 08/27/2019   Left shoulder pain 08/27/2019   Hyperlipidemia 07/14/2018   Asthma, mild intermittent 06/06/2018   Morbid obesity ([place]) 01/09/2018   Vitamin D deficiency 06/11/2017   Preventative health care 06/11/2017   Hypocalcemia 02/25/2017   Bradycardia 02/25/2017   Hearing loss 01/18/2017   Hyperglycemia 03/05/2016   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   CHF (congestive heart failure) ([place]) 05/26/2015   Female bladder prolapse 05/26/2015   IBS (irritable bowel syndrome) 05/26/2015   History of chicken pox    Allergic urticaria 08/01/2014   Food allergy 08/01/2014   Tinnitus 03/14/2014   Osteoarthritis    Nonspecific  abnormal electrocardiogram (ECG) (EKG)    Headache 10/21/2013   Chiari malformation    GERD 11/10/2010   Obstructive sleep apnea 08/28/2010   Essential hypertension 08/28/2010   Seasonal and perennial allergic rhinitis 08/28/2010   DIVERTICULOSIS, COLON 08/28/2010    ONSET DATE: "Two years ago"   REFERRING DIAG: R13.10 (ICD-10-CM) - Dysphagia, unspecified type  THERAPY DIAG:  Dysphagia, pharyngeal phase  Rationale for Evaluation and Treatment Rehabilitation  SUBJECTIVE:   SUBJECTIVE STATEMENT: "I am feeling much better - I tested last night and I was negative." Pt accompanied by: self  PERTINENT HISTORY: Anne Barnes is a 76 y.o. female with PMH: allergic rhinitis, mild asthma, OSA, CHF, nonobstructive CAD on  coronary CTA, HTN, obesity, bradycardia, chiari malformation, GERD followed by GI. Per 04/27/22 GI note, patient reporting her reflux symptoms started in late 2022 and included heartburn, intermittent chest pain lasting up to a few hours, increased belching, can have associated dyspepsia. She was started on Prilosec 40 mg for four weeks with plan to titrate to lowes effective dose; EGD recommended but has not been completed.   PAIN:  Are you having pain? No   PATIENT GOALS  Regain normal swallow function  OBJECTIVE:   DIAGNOSTIC FINDINGS:  MRI HEAD WITHOUT CONTRAST  01/28/20 TECHNIQUE: Multiplanar, multiecho pulse sequences of the brain and surrounding structures were obtained without intravenous contrast. COMPARISON:  Head CT November 24, 2019. FINDINGS: Brain: No acute infarction, hemorrhage, hydrocephalus, extra-axial collection or mass lesion. Few scattered foci of T2 hyperintensity are seen within the white matter of the cerebral hemispheres, nonspecific. Prominence of the cerebral sulci in the bilateral parietal regions and prominence of the supratentorial ventricles which has progressed since prior MRI performed in September 30, 2013, but is stable from prior CT. 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 Vascular: Normal flow voids. Skull and upper cervical spine: Normal marrow signal. Sinuses/Orbits: Mild mucosal thickening of the ethmoid cells. The orbits are maintained. IMPRESSION: 1. No acute intracranial abnormality. 2. Prominence of the cerebral sulci in the bilateral parietal regions and prominence of the supratentorial ventricles which has progressed since prior MRI performed in September 30, 2013, but is stable from prior CT performed in September 30, 2013. 3. Chiari 1 malformation.  RESULTS FROM OBJECTIVE SWALLOW STUDY (MBSS/FEES):   [name] presents with a normal oral phase of swallow but a moderately impaired pharyngeal phase of swallow. During  pharyngeal phase she exhibited swallow initiation delay with thin liquids and at times with nectar thick liquids. She exhibited consistent aspiration during the swallow with cup sips of thin liquids and straw sips of nectar thick liquids. Aspiration occured without patient sensing until after aspirate had transited past vocal cords. She did then sense aspiration with a cough but this did not aid in expelling aspirate. When liquids were transiting, portion of liquid bolus moves under epiglottis before it comes into full contact to protect airway. She did not exhibit any aspiration when taking small sips of nectar thick liquids. No significant difficulties with puree, regular, 58m barium tablet or honey thick liquids. Aspiration appears to be impacted by reduced epiglottic deflection as pharyngeal space is more narrow (?pharyngeal edema) and cannot r/o impact from appearance of prominent cricopharyngeal bar.  When liquids transiting, portion of liquid bolus moves under epiglottis before it comes into full contact to protect airway. SLP is recommending nectar thick liquids and continue with solids as she has been. Ms SCarrellwas given a sample box of Simply Thick food thickener (  nectar consistency). SLP recommending OP SLP for dysphagia and patient's PCP contacted via secure EPIC message.     PATIENT REPORTED OUTCOME MEASURES (PROM): EAT-10: completed with pt. Score was 12. "4" reported with #2 (ability to go out for meals), and #3 (swallowing liquids takes extra effort); "2" reported with #9 (coughing when I eat/drink) and #10 (swallowing is stressful).    TODAY'S TREATMENT:  06/20/22: Pt completed her HEP with modified independence - SLP provided reassurance she was performing correctly. Pt did 5-7 of each exercise without cues necessary but pt cont'd to look at her HEP sheet for assistance. Pt continues to have nectar-liquids at home. Performed HEP with touching her tongue to water if necessary to have  something wet to swallow in order to perform HEP. If pt looks the same next session we will decr to once/week.   06/13/22: Pt reports "some mornings I just wake up very mucousy. Today I am." SLP hears lung/pharyngeal congestion today. Emonie reports hypersensitivity in her throat and nose/lungs is evident when this occurs. Coughing with 65% of Mendelsohn. "It's not because it's going down the wrong way, it's because of that mucous." Pt performed HEP correctly 95% of the time but cont'd to say, "That wasn't right" for approx 25% of her attempts. SLP cont'd to encourage pt that her procedure was correct 95% of the time, and she corrected herself with SLP A when she was incorrect.   06/08/22: Pt again req'd rare min A for HEP procedure but was modified independent by session end. Often she stated that the rep was not good but it was WNL with slight modification (e.g., air escape on supraglottic). Kilie told SLP she was primarily performing [place] and supraglottic and SLP encouraged her to perform all.   06/06/22: Pt req'd min A initially for [facility] but was independent after x6 reps. Otherwise pt was modified independent with HEP. Pt stated, "I know this is helping, my swallowing is different and better." Pt told SLP rationale for nectar liquids correctly.  PATIENT EDUCATION: Education details: see above in "today's treatment" Person educated: Patient Education method: Explanation, Demonstration, Verbal cues, and Handouts Education comprehension: verbalized understanding, returned demonstration, verbal cues required, and needs further education   ASSESSMENT:  CLINICAL IMPRESSION: Patient was seen today for swallowing therapy after a MBS on 05/16/22. Pt without notable neurological history, so SLP suspects pharyngeal dysphagia has a deconditioning component. There is also question esophageal component - pt undergoing EGD procedure 07-21-22. Pt continues as highly motivated. Today pt req'd modified  independence (looking at HEP sheet), an improvement over last week. SLP may suggest repeat of MBS beginning of November 2023. If pt is SBA/modified independent next session, frequency can decr to once/week.  OBJECTIVE IMPAIRMENTS include dysphagia. These impairments are limiting patient from safety when swallowing. Factors affecting potential to achieve goals and functional outcome are co-morbidities (esophageal component?). Patient will benefit from skilled SLP services to address above impairments and improve overall function.  REHAB POTENTIAL: Good   GOALS: Goals reviewed with patient? Yes  SHORT TERM GOALS: Target date: 06/19/2022    Pt will perform HEP with rare min A in 2 sessions Baseline: 06-06-22 Goal status: Met  2.  Pt will tell SLP 3 overt s/s aspiration PNA with modified independence in two sessions Baseline: 06/06/22, 06-11-22 Goal status: Met  3.  Pt will tell SLP rationale for nectar liquids in two sessions Baseline: 05/30/22 Goal status: Met   LONG TERM GOALS: Target date: 07/24/2022    Pt  will perform HEP with modified independence in 2 sessions Baseline: 06-20-22 Goal status: [place]   PLAN: SLP FREQUENCY: 2x/week x4 weeks, then x1/week x2 weeks  SLP DURATION: 8 weeks  PLANNED INTERVENTIONS: Aspiration precaution training, Pharyngeal strengthening exercises, Diet toleration management , Trials of upgraded texture/liquids, Internal/external aids, and Functional tasks    [facility], [place] 06/20/2022, 10:50 AM

## 2022-06-21 NOTE — Telephone Encounter (Signed)
Albuterol inhaler refilled 

## 2022-06-22 ENCOUNTER — Ambulatory Visit (INDEPENDENT_AMBULATORY_CARE_PROVIDER_SITE_OTHER): Payer: Medicare HMO

## 2022-06-22 DIAGNOSIS — J309 Allergic rhinitis, unspecified: Secondary | ICD-10-CM

## 2022-06-27 ENCOUNTER — Ambulatory Visit: Payer: Medicare HMO

## 2022-07-04 ENCOUNTER — Ambulatory Visit: Payer: Medicare HMO | Attending: Family Medicine

## 2022-07-04 DIAGNOSIS — R1313 Dysphagia, pharyngeal phase: Secondary | ICD-10-CM | POA: Diagnosis not present

## 2022-07-04 NOTE — Therapy (Signed)
Anne Barnes Kitchen OUTPATIENT SPEECH LANGUAGE PATHOLOGY SWALLOWING TREATMENT   Patient Name: Anne Barnes MRN: [id number] DOB:July 19, 1946, 76 y.o., female Today's Date: 07/04/2022  PCP: Gwyneth Revels, MD REFERRING PROVIDER: Lavena Bullion, MD:    End of Session - 07/04/22 1129     Visit Number 8    Number of Visits 12    Date for SLP Re-Evaluation 07/24/22    SLP Start Time 1103    SLP Stop Time  1132    SLP Time Calculation (min) 29 min    Activity Tolerance Patient tolerated treatment well                  Past Medical History:  Diagnosis Date   Acute combined systolic and diastolic heart failure ([place]) 05/26/2015   ALLERGIC RHINITIS    Allergic urticaria 08/01/2014   Patient reports allergies to shrimp, egg whites, tomatoes, dairy    Allergy    seasonal and numerous food and drug allergies.   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   Office Spirometry 11/07/17-WNL-FVC 1.58/82%, FEV1 1.27/85%, ratio 0.80, FEF 25-75% 0.26/89%   Bradycardia 02/25/2017   Chiari malformation    Noted MRI brain 09/2013 - s/p neuro eval for same   DIVERTICULOSIS, COLON    Essential hypertension 08/28/2010   Qualifier: Diagnosis of  By: Marca Ancona RMA, Lucy      Frequent headaches    GERD    H/O measles    H/O mumps    Hearing loss 01/18/2017   History of chicken pox    Hyperglycemia 03/05/2016   HYPERTENSION    Hypocalcemia 02/25/2017   IBS (irritable bowel syndrome) 05/26/2015   Nonspecific abnormal electrocardiogram (ECG) (EKG)    OBSTRUCTIVE SLEEP APNEA 12/2008 dx   noncompliant with CPAP qhs   Obstructive sleep apnea 08/28/2010   NPSG 12/2008:  AHI 13/hr with desats to 78%    Preventative health care 06/11/2017   Sciatica    right side   Seasonal and perennial allergic rhinitis 08/28/2010   Allergy profile 03/11/14- positive especially for dust, cat and dog Food allergy profile- total IgE 86 with several food group elevations Sed rate-WNL      Sinusitis 06/06/2017   Sleep apnea    uses cpap   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   Vaginitis 01/18/2017   Vitamin D deficiency 06/11/2017   Past Surgical History:  Procedure Laterality Date   [place]   Breast biopsy   COLONOSCOPY  2011   Dr Collene Mares   UMBILICAL HERNIA REPAIR     Patient Active Problem List   Diagnosis Date Noted   Neck pain on left side 12/13/2021   Congestion of throat 12/13/2021   Osteoporosis 12/13/2021   COVID 04/20/2021   Chest pain of uncertain etiology 62/86/3817   Allergies 12/22/2019   Dizziness 11/19/2019   Grief reaction 08/27/2019   Left shoulder pain 08/27/2019   Hyperlipidemia 07/14/2018   Asthma, mild intermittent 06/06/2018   Morbid obesity ([place]) 01/09/2018   Vitamin D deficiency 06/11/2017   Preventative health care 06/11/2017   Hypocalcemia 02/25/2017   Bradycardia 02/25/2017   Hearing loss 01/18/2017   Hyperglycemia 03/05/2016   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   CHF (congestive heart failure) ([place]) 05/26/2015   Female bladder prolapse 05/26/2015   IBS (irritable bowel syndrome) 05/26/2015   History of chicken pox    Allergic urticaria 08/01/2014   Food allergy 08/01/2014   Tinnitus 03/14/2014   Osteoarthritis  Nonspecific abnormal electrocardiogram (ECG) (EKG)    Headache 10/21/2013   Chiari malformation    GERD 11/10/2010   Obstructive sleep apnea 08/28/2010   Essential hypertension 08/28/2010   Seasonal and perennial allergic rhinitis 08/28/2010   DIVERTICULOSIS, COLON 08/28/2010    ONSET DATE: "Two years ago"   REFERRING DIAG: R13.10 (ICD-10-CM) - Dysphagia, unspecified type  THERAPY DIAG:  Dysphagia, pharyngeal phase  Rationale for Evaluation and Treatment Rehabilitation  SUBJECTIVE:   SUBJECTIVE STATEMENT: "I am feeling very weak and light headed this week, Anne Barnes." Pt accompanied by: self  PERTINENT HISTORY: Anne Barnes is a 76 y.o. female with PMH: allergic rhinitis, mild asthma, OSA, CHF, nonobstructive CAD on coronary CTA,  HTN, obesity, bradycardia, chiari malformation, GERD followed by GI. Per 04/27/22 GI note, patient reporting her reflux symptoms started in late 2022 and included heartburn, intermittent chest pain lasting up to a few hours, increased belching, can have associated dyspepsia. She was started on Prilosec 40 mg for four weeks with plan to titrate to lowes effective dose; EGD recommended but has not been completed.   PAIN:  Are you having pain? No   PATIENT GOALS  Regain normal swallow function  OBJECTIVE:   DIAGNOSTIC FINDINGS:  MRI HEAD WITHOUT CONTRAST  01/28/20 TECHNIQUE: Multiplanar, multiecho pulse sequences of the brain and surrounding structures were obtained without intravenous contrast. COMPARISON:  Head CT November 24, 2019. FINDINGS: Brain: No acute infarction, hemorrhage, hydrocephalus, extra-axial collection or mass lesion. Few scattered foci of T2 hyperintensity are seen within the white matter of the cerebral hemispheres, nonspecific. Prominence of the cerebral sulci in the bilateral parietal regions and prominence of the supratentorial ventricles which has progressed since prior MRI performed in September 30, 2013, but is stable from prior CT. 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 Vascular: Normal flow voids. Skull and upper cervical spine: Normal marrow signal. Sinuses/Orbits: Mild mucosal thickening of the ethmoid cells. The orbits are maintained. IMPRESSION: 1. No acute intracranial abnormality. 2. Prominence of the cerebral sulci in the bilateral parietal regions and prominence of the supratentorial ventricles which has progressed since prior MRI performed in September 30, 2013, but is stable from prior CT performed in September 30, 2013. 3. Chiari 1 malformation.  RESULTS FROM OBJECTIVE SWALLOW STUDY (MBSS/FEES):   Anne Barnes presents with a normal oral phase of swallow but a moderately impaired pharyngeal phase of swallow. During pharyngeal  phase she exhibited swallow initiation delay with thin liquids and at times with nectar thick liquids. She exhibited consistent aspiration during the swallow with cup sips of thin liquids and straw sips of nectar thick liquids. Aspiration occured without patient sensing until after aspirate had transited past vocal cords. She did then sense aspiration with a cough but this did not aid in expelling aspirate. When liquids were transiting, portion of liquid bolus moves under epiglottis before it comes into full contact to protect airway. She did not exhibit any aspiration when taking small sips of nectar thick liquids. No significant difficulties with puree, regular, 26m barium tablet or honey thick liquids. Aspiration appears to be impacted by reduced epiglottic deflection as pharyngeal space is more narrow (?pharyngeal edema) and cannot r/o impact from appearance of prominent cricopharyngeal bar.  When liquids transiting, portion of liquid bolus moves under epiglottis before it comes into full contact to protect airway. SLP is recommending nectar thick liquids and continue with solids as she has been. Ms SWulfwas given a sample box of Simply Thick food thickener (nectar consistency).  SLP recommending OP SLP for dysphagia and patient's PCP contacted via secure EPIC message.     PATIENT REPORTED OUTCOME MEASURES (PROM): EAT-10: completed with pt. Score was 12. "4" reported with #2 (ability to go out for meals), and #3 (swallowing liquids takes extra effort); "2" reported with #9 (coughing when I eat/drink) and #10 (swallowing is stressful).    TODAY'S TREATMENT:  07/04/22: Anne Barnes states that she feels like the exercises are going better when she has her new dentures placed. Today she does not have them placed due to being adjusted.  Pt stated, "I'm thinking that I might need to just make (HEP) a part of my life from now on, and that's ok." Her procedure was WNL for all exercises. Pt stated she was doing 5 of  effortful and then waiting 15 minutes and doing 5 more. SLP told pt rest no more than 2 minutes between 5 rep sets. She demo'd understanding. SLP told pt she could add 2-5 more reps to any exercise that after 10 reps it feels like pt could do more reps.  Pt procedure was excellent today and SLP and pt agreed she could return every 2 weeks, with target follow up MBS date the week of 08-06-22..   06/20/22: Pt completed her HEP with modified independence - SLP provided reassurance she was performing correctly. Pt did 5-7 of each exercise without cues necessary but pt cont'd to look at her HEP sheet for assistance. Pt continues to have nectar-liquids at home. Performed HEP with touching her tongue to water if necessary to have something wet to swallow in order to perform HEP. If pt looks the same next session we will decr to once/week.   06/13/22: Pt reports "some mornings I just wake up very mucousy. Today I am." SLP hears lung/pharyngeal congestion today. Anne Barnes reports hypersensitivity in her throat and nose/lungs is evident when this occurs. Coughing with 65% of Mendelsohn. "It's not because it's going down the wrong way, it's because of that mucous." Pt performed HEP correctly 95% of the time but cont'd to say, "That wasn't right" for approx 25% of her attempts. SLP cont'd to encourage pt that her procedure was correct 95% of the time, and she corrected herself with SLP A when she was incorrect.   06/08/22: Pt again req'd rare min A for HEP procedure but was modified independent by session end. Often she stated that the rep was not good but it was WNL with slight modification (e.g., air escape on supraglottic). Anne Barnes told SLP she was primarily performing Anne Barnes and supraglottic and SLP encouraged her to perform all.   06/06/22: Pt req'd min A initially for [facility] but was independent after x6 reps. Otherwise pt was modified independent with HEP. Pt stated, "I know this is helping, my swallowing is  different and better." Pt told SLP rationale for nectar liquids correctly.  PATIENT EDUCATION: Education details: see above in "today's treatment" Person educated: Patient Education method: Explanation, Demonstration, Verbal cues, and Handouts Education comprehension: verbalized understanding, returned demonstration, verbal cues required, and needs further education   ASSESSMENT:  CLINICAL IMPRESSION: LTG added for follow up MBS when clinically indicated. Patient was seen today for swallowing therapy after a MBS on 05/16/22. Pt without notable neurological history, so SLP suspects pharyngeal dysphagia has a deconditioning component. There is also question esophageal component - pt undergoing EGD procedure 07-21-22. Pt continues as highly motivated. Today pt again req'd modified independence. SLP/pt agreed frequency can decr to once/ every other week.  OBJECTIVE IMPAIRMENTS include dysphagia. These impairments are limiting patient from safety when swallowing. Factors affecting potential to achieve goals and functional outcome are co-morbidities (esophageal component?). Patient will benefit from skilled SLP services to address above impairments and improve overall function.  REHAB POTENTIAL: Good   GOALS: Goals reviewed with patient? Yes  SHORT TERM GOALS: Target date: 06/19/2022    Pt will perform HEP with rare min A in 2 sessions Baseline: 06-06-22 Goal status: Met  2.  Pt will tell SLP 3 overt s/s aspiration PNA with modified independence in two sessions Baseline: 06/06/22, 06-11-22 Goal status: Met  3.  Pt will tell SLP rationale for nectar liquids in two sessions Baseline: 05/30/22 Goal status: Met   LONG TERM GOALS: Target date: 07/24/2022    Pt will perform HEP with modified independence in 3 sessions Baseline: 06-20-22 Goal status: Revised  2.  Pt will participate in follow up MBS when clinically indicated  Baseline:  Goal Status: Initial   PLAN: SLP FREQUENCY: 2x/week  x4 weeks, then x1/week x2 weeks  SLP DURATION: 8 weeks  PLANNED INTERVENTIONS: Aspiration precaution training, Pharyngeal strengthening exercises, Diet toleration management , Trials of upgraded texture/liquids, Internal/external aids, and Functional tasks    [facility], [place] 07/04/2022, 11:51 AM

## 2022-07-11 ENCOUNTER — Encounter (HOSPITAL_COMMUNITY): Payer: Self-pay | Admitting: Gastroenterology

## 2022-07-12 ENCOUNTER — Ambulatory Visit (INDEPENDENT_AMBULATORY_CARE_PROVIDER_SITE_OTHER): Payer: Medicare HMO | Admitting: *Deleted

## 2022-07-12 DIAGNOSIS — J309 Allergic rhinitis, unspecified: Secondary | ICD-10-CM

## 2022-07-13 ENCOUNTER — Ambulatory Visit: Payer: Medicare HMO

## 2022-07-15 NOTE — Assessment & Plan Note (Deleted)
hgba1c acceptable, minimize simple carbs. Increase exercise as tolerated.  RSV (respiratory syncitial virus) vaccine at pharmacy, Arexvy Covid booster when new version at pharmacy High dose flu shot  Shingrix is the new shingles shot, 2 shots over 2-6 months, confirm coverage with insurance and document, then can return here for shots with nurse appt or at pharmacy

## 2022-07-15 NOTE — Assessment & Plan Note (Deleted)
Avoid offending foods, start probiotics. Do not eat large meals in late evening and consider raising head of bed. Continue current meds. She has an upper endoscopy scheduled with GI later this week

## 2022-07-15 NOTE — Progress Notes (Deleted)
Subjective:    Patient ID: Anne Barnes, female    DOB: [birth year], 76 y.o.   MRN: [id number]  No chief complaint on file.   HPI Patient is in today for follow up on chronic medical concerns. No recent febrile illness or acute hospitalizations. No complaints of polyria or polydipsia. She is trying to stay active and maintain a heart healthy diet. Her friends and family continue to help her as she struggles with the loss of her husband. Denies CP/palp/SOB/HA/congestion/fevers/GI or GU c/o. Taking meds as prescribed   Past Medical History:  Diagnosis Date   Acute combined systolic and diastolic heart failure ([place]) 05/26/2015   ALLERGIC RHINITIS    Allergic urticaria 08/01/2014   Patient reports allergies to shrimp, egg whites, tomatoes, dairy    Allergy    seasonal and numerous food and drug allergies.   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   Office Spirometry 11/07/17-WNL-FVC 1.58/82%, FEV1 1.27/85%, ratio 0.80, FEF 25-75% 0.26/89%   Bradycardia 02/25/2017   Chiari malformation    Noted MRI brain 09/2013 - s/p neuro eval for same   DIVERTICULOSIS, COLON    Essential hypertension 08/28/2010   Qualifier: Diagnosis of  By: Marca Ancona RMA, Lucy      Frequent headaches    GERD    H/O measles    H/O mumps    Hearing loss 01/18/2017   History of chicken pox    Hyperglycemia 03/05/2016   HYPERTENSION    Hypocalcemia 02/25/2017   IBS (irritable bowel syndrome) 05/26/2015   Nonspecific abnormal electrocardiogram (ECG) (EKG)    OBSTRUCTIVE SLEEP APNEA 12/2008 dx   noncompliant with CPAP qhs   Obstructive sleep apnea 08/28/2010   NPSG 12/2008:  AHI 13/hr with desats to 78%    Preventative health care 06/11/2017   Sciatica    right side   Seasonal and perennial allergic rhinitis 08/28/2010   Allergy profile 03/11/14- positive especially for dust, cat and dog Food allergy profile- total IgE 86 with several food group elevations Sed rate-WNL      Sinusitis 06/06/2017   Sleep apnea    uses cpap   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   Vaginitis 01/18/2017   Vitamin D deficiency 06/11/2017    Past Surgical History:  Procedure Laterality Date   [place]   Breast biopsy   COLONOSCOPY  2011   Dr Collene Mares   UMBILICAL HERNIA REPAIR      Family History  Problem Relation Age of Onset   Arthritis Mother        died of complications from hip surgery   Arthritis Father    Kidney disease Father    Pneumonia Father    Heart disease Father    Lupus Daughter    Rheum arthritis Daughter    Sjogren's syndrome Daughter    Fibromyalgia Daughter    Diabetes Brother    Heart disease Brother    Heart attack Brother    Alcohol abuse Brother    Throat cancer Brother    Lung cancer Brother    Kidney disease Sister        dialysis   Obesity Sister    Hypertension Maternal Grandfather    Hypertension Sister    Kidney disease Sister        dialysis   Pneumonia Brother    Hypertension Daughter    Berenice Primas' disease Daughter    Hypertension Daughter    Alcohol abuse Other  parent   Ataxia Neg Hx    Chorea Neg Hx    Dementia Neg Hx    Mental retardation Neg Hx    Migraines Neg Hx    Multiple sclerosis Neg Hx    Neurofibromatosis Neg Hx    Neuropathy Neg Hx    Parkinsonism Neg Hx    Seizures Neg Hx    Stroke Neg Hx    Colon cancer Neg Hx    Colon polyps Neg Hx    Esophageal cancer Neg Hx    Rectal cancer Neg Hx    Stomach cancer Neg Hx     Social History   Socioeconomic History   Marital status: Widowed    Spouse name: Not on file   Number of children: 5   Years of education: Not on file   Highest education level: Not on file  Occupational History   Occupation: retired  Tobacco Use   Smoking status: Never   Smokeless tobacco: Never   Tobacco comments:    dtr 2 g-kids.  Vaping Use   Vaping Use: Never used  Substance and Sexual Activity   Alcohol use: No    Comment: rare   Drug use: No   Sexual activity: Not Currently  Other Topics  Concern   Not on file  Social History Narrative   Right handed   One story with daughter and two grandkids   Social Determinants of Health   Financial Resource Strain: Low Risk  (06/04/2022)   Overall Financial Resource Strain (CARDIA)    Difficulty of Paying Living Expenses: Not hard at all  Food Insecurity: No Food Insecurity (06/04/2022)   Hunger Vital Sign    Worried About Running Out of Food in the Last Year: Never true    Ran Out of Food in the Last Year: Never true  Transportation Needs: No Transportation Needs (06/04/2022)   PRAPARE - Hydrologist (Medical): No    Lack of Transportation (Non-Medical): No  Physical Activity: Insufficiently Active (06/04/2022)   Exercise Vital Sign    Days of Exercise per Week: 4 days    Minutes of Exercise per Session: 30 min  Stress: No Stress Concern Present (06/04/2022)   [place]    Feeling of Stress : Not at all  Social Connections: Moderately Isolated (06/04/2022)   Social Connection and Isolation Panel [NHANES]    Frequency of Communication with Friends and Family: More than three times a week    Frequency of Social Gatherings with Friends and Family: More than three times a week    Attends Religious Services: More than 4 times per year    Active Member of [employer] or Organizations: No    Attends Archivist Meetings: Never    Marital Status: Divorced  Human resources officer Violence: Not At Risk (06/04/2022)   Humiliation, Afraid, Rape, and Kick questionnaire    Fear of Current or Ex-Partner: No    Emotionally Abused: No    Physically Abused: No    Sexually Abused: No    Outpatient Medications Prior to Visit  Medication Sig Dispense Refill   albuterol (VENTOLIN HFA) 108 (90 Base) MCG/ACT inhaler INHALE 2 PUFFS BY MOUTH EVERY 6 HOURS AS NEEDED FOR WHEEZING FOR SHORTNESS OF BREATH 18 g 12   alendronate (FOSAMAX) 70 MG tablet Take 1 tablet  (70 mg total) by mouth once a week. Take with a full glass of water on an empty stomach.  4 tablet 5   ALPRAZolam (XANAX) 0.5 MG tablet TAKE 1 TABLET BY MOUTH TWICE DAILY AS NEEDED FOR ANXIETY AND FOR SLEEP 20 tablet 1   amLODipine (NORVASC) 2.5 MG tablet Take 1 tablet (2.5 mg total) by mouth 2 (two) times daily. 180 tablet 1   aspirin EC 81 MG tablet Take 81 mg by mouth daily. Swallow whole.     azelastine (ASTELIN) 0.1 % nasal spray Place 2 sprays into both nostrils 2 (two) times daily as needed for rhinitis. 30 mL 5   b complex vitamins tablet Take 1 tablet by mouth daily.     Cholecalciferol (VITAMIN D) 50 MCG (2000 UT) tablet Take 2,000 Units by mouth daily.     EPINEPHrine 0.3 mg/0.3 mL IJ SOAJ injection Inject 0.3 mg into the muscle as needed for anaphylaxis. 2 each 1   famotidine (PEPCID) 40 MG tablet Take 1 tablet (40 mg total) by mouth 2 (two) times daily. (Patient not taking: Reported on 05/22/2022) 180 tablet 1   fluticasone (FLONASE) 50 MCG/ACT nasal spray Place 2 sprays into both nostrils daily. (Patient not taking: Reported on 07/12/2022) 1 g 0   furosemide (LASIX) 20 MG tablet Take 1 tablet (20 mg total) by mouth 3 (three) times a week. Monday, Wednesday and Friday. 90 tablet 3   levocetirizine (XYZAL) 5 MG tablet TAKE 1 TABLET BY MOUTH ONCE DAILY IN THE EVENING 30 tablet 5   losartan (COZAAR) 100 MG tablet Take 1 tablet by mouth once daily 90 tablet 0   montelukast (SINGULAIR) 10 MG tablet Take 1 tablet (10 mg total) by mouth at bedtime. 90 tablet 1   Multiple Vitamin (MULTIVITAMIN) tablet Take 1 tablet by mouth daily.     olopatadine (PATANOL) 0.1 % ophthalmic solution Place 1 drop into both eyes 2 (two) times daily. (Patient taking differently: Place 1 drop into both eyes daily as needed for allergies.) 5 mL 5   omeprazole (PRILOSEC) 40 MG capsule Take 1 capsule by mouth twice daily 90 capsule 0   rosuvastatin (CRESTOR) 10 MG tablet Take 1 tablet (10 mg total) by mouth daily. 90  tablet 2   spironolactone (ALDACTONE) 25 MG tablet Take 1 tablet (25 mg total) by mouth daily. 90 tablet 2   No facility-administered medications prior to visit.    Allergies  Allergen Reactions   Codeine Nausea And Vomiting   Hydralazine Hcl Other (See Comments)    Pt reports causes dizziness   Lisinopril-Hydrochlorothiazide Hives    REACTION: lip swelling,facial swelling,rash.   Metoprolol Other (See Comments)    Does not feel well on it    Other Cough    Patient states she is allergic to Shrimp, egg whites, tomatoes and dairy products.  Says they make her feel unwell but do not cause hives, SOB or anaphylaxis.  She just avoids eating them because they can make her have a headache.  Strawberries, watermelon, peanuts, and all shellfish.    environmental   Shrimp [Shellfish Allergy] Other (See Comments)    Was allergy tested and showed shrimp was one--has not reacted b/c avoided    Review of Systems  Constitutional:  Negative for fever and malaise/fatigue.  HENT:  Negative for congestion.   Eyes:  Negative for blurred vision.  Respiratory:  Negative for shortness of breath.   Cardiovascular:  Negative for chest pain, palpitations and leg swelling.  Gastrointestinal:  Negative for abdominal pain, blood in stool and nausea.  Genitourinary:  Negative for dysuria and frequency.  Musculoskeletal:  Negative for falls.  Skin:  Negative for rash.  Neurological:  Negative for dizziness, loss of consciousness and headaches.  Endo/Heme/Allergies:  Negative for environmental allergies.  Psychiatric/Behavioral:  Negative for depression. The patient is not nervous/anxious.        Objective:    Physical Exam Constitutional:      General: She is not in acute distress.    Appearance: She is well-developed.  HENT:     Head: Normocephalic and atraumatic.  Eyes:     Conjunctiva/sclera: Conjunctivae normal.  Neck:     Thyroid: No thyromegaly.  Cardiovascular:     Rate and Rhythm:  Normal rate and regular rhythm.     Heart sounds: Normal heart sounds. No murmur heard. Pulmonary:     Effort: Pulmonary effort is normal. No respiratory distress.     Breath sounds: Normal breath sounds.  Abdominal:     General: Bowel sounds are normal. There is no distension.     Palpations: Abdomen is soft. There is no mass.     Tenderness: There is no abdominal tenderness.  Musculoskeletal:     Cervical back: Neck supple.  Lymphadenopathy:     Cervical: No cervical adenopathy.  Skin:    General: Skin is warm and dry.  Neurological:     Mental Status: She is alert and oriented to person, place, and time.  Psychiatric:        Behavior: Behavior normal.     There were no vitals taken for this visit. Wt Readings from Last 3 Encounters:  06/04/22 230 lb (104.3 kg)  04/27/22 233 lb 6 oz (105.9 kg)  03/29/22 235 lb (106.6 kg)    Diabetic Foot Exam - Simple   No data filed    Lab Results  Component Value Date   WBC 9.5 03/29/2022   HGB 12.8 03/29/2022   HCT 39.2 03/29/2022   PLT 249.0 03/29/2022   GLUCOSE 81 03/29/2022   CHOL 117 03/29/2022   TRIG 44.0 03/29/2022   HDL 42.30 03/29/2022   LDLCALC 66 03/29/2022   ALT 25 03/29/2022   AST 19 03/29/2022   NA 140 03/29/2022   K 4.2 03/29/2022   CL 103 03/29/2022   CREATININE 0.90 03/29/2022   BUN 18 03/29/2022   CO2 30 03/29/2022   TSH 1.92 03/29/2022   INR 1.06 01/10/2015   HGBA1C 5.5 03/29/2022    Lab Results  Component Value Date   TSH 1.92 03/29/2022   Lab Results  Component Value Date   WBC 9.5 03/29/2022   HGB 12.8 03/29/2022   HCT 39.2 03/29/2022   MCV 91.4 03/29/2022   PLT 249.0 03/29/2022   Lab Results  Component Value Date   NA 140 03/29/2022   K 4.2 03/29/2022   CO2 30 03/29/2022   GLUCOSE 81 03/29/2022   BUN 18 03/29/2022   CREATININE 0.90 03/29/2022   BILITOT 0.5 03/29/2022   ALKPHOS 81 03/29/2022   AST 19 03/29/2022   ALT 25 03/29/2022   PROT 6.9 03/29/2022   ALBUMIN 4.1 03/29/2022    CALCIUM 9.3 03/29/2022   ANIONGAP 9 09/15/2017   EGFR 61 05/12/2021   GFR 62.96 03/29/2022   Lab Results  Component Value Date   CHOL 117 03/29/2022   Lab Results  Component Value Date   HDL 42.30 03/29/2022   Lab Results  Component Value Date   LDLCALC 66 03/29/2022   Lab Results  Component Value Date   TRIG 44.0 03/29/2022   Lab Results  Component Value Date   CHOLHDL 3 03/29/2022   Lab Results  Component Value Date   HGBA1C 5.5 03/29/2022       Assessment & Plan:   Problem List Items Addressed This Visit     Essential hypertension    Well controlled, no changes to meds. Encouraged heart healthy diet such as the DASH diet and exercise as tolerated.       GERD    Avoid offending foods, start probiotics. Do not eat large meals in late evening and consider raising head of bed. Continue current meds. She has an upper endoscopy scheduled with GI later this week      Hyperglycemia    hgba1c acceptable, minimize simple carbs. Increase exercise as tolerated.  RSV (respiratory syncitial virus) vaccine at pharmacy, Arexvy Covid booster when new version at pharmacy High dose flu shot  Shingrix is the new shingles shot, 2 shots over 2-6 months, confirm coverage with insurance and document, then can return here for shots with nurse appt or at pharmacy      Vitamin D deficiency    Supplement and monitor      Hyperlipidemia    Encourage heart healthy diet such as MIND or DASH diet, increase exercise, avoid trans fats, simple carbohydrates and processed foods, consider a krill or fish or flaxseed oil cap daily. Tolerating Rosuvastatin      Grief reaction    Continues to manage her grief over the loss of her husband      Osteoporosis    Encouraged to get adequate exercise, calcium and vitamin d intake       I am having Anne Barnes maintain her multivitamin, b complex vitamins, aspirin EC, furosemide, olopatadine, famotidine, EPINEPHrine, azelastine,  Vitamin D, fluticasone, rosuvastatin, alendronate, levocetirizine, ALPRAZolam, losartan, spironolactone, amLODipine, montelukast, omeprazole, and albuterol.  No orders of the defined types were placed in this encounter.    Penni Homans, MD

## 2022-07-15 NOTE — Assessment & Plan Note (Deleted)
Continues to manage her grief over the loss of her husband

## 2022-07-15 NOTE — Assessment & Plan Note (Deleted)
Well controlled, no changes to meds. Encouraged heart healthy diet such as the DASH diet and exercise as tolerated.  °

## 2022-07-15 NOTE — Assessment & Plan Note (Deleted)
Encouraged to get adequate exercise, calcium and vitamin d intake 

## 2022-07-15 NOTE — Assessment & Plan Note (Deleted)
Encourage heart healthy diet such as MIND or DASH diet, increase exercise, avoid trans fats, simple carbohydrates and processed foods, consider a krill or fish or flaxseed oil cap daily.  Tolerating Rosuvastatin 

## 2022-07-15 NOTE — Assessment & Plan Note (Deleted)
Supplement and monitor 

## 2022-07-16 ENCOUNTER — Ambulatory Visit: Payer: Medicare HMO | Admitting: Family Medicine

## 2022-07-16 ENCOUNTER — Ambulatory Visit: Payer: Medicare HMO

## 2022-07-16 DIAGNOSIS — M810 Age-related osteoporosis without current pathological fracture: Secondary | ICD-10-CM

## 2022-07-16 DIAGNOSIS — K219 Gastro-esophageal reflux disease without esophagitis: Secondary | ICD-10-CM

## 2022-07-16 DIAGNOSIS — I10 Essential (primary) hypertension: Secondary | ICD-10-CM

## 2022-07-16 DIAGNOSIS — E7800 Pure hypercholesterolemia, unspecified: Secondary | ICD-10-CM

## 2022-07-16 DIAGNOSIS — F4321 Adjustment disorder with depressed mood: Secondary | ICD-10-CM

## 2022-07-16 DIAGNOSIS — E559 Vitamin D deficiency, unspecified: Secondary | ICD-10-CM

## 2022-07-16 DIAGNOSIS — R739 Hyperglycemia, unspecified: Secondary | ICD-10-CM

## 2022-07-19 ENCOUNTER — Ambulatory Visit (HOSPITAL_COMMUNITY): Payer: Medicare HMO | Admitting: Anesthesiology

## 2022-07-19 ENCOUNTER — Other Ambulatory Visit: Payer: Self-pay

## 2022-07-19 ENCOUNTER — Ambulatory Visit (HOSPITAL_COMMUNITY)
Admission: RE | Admit: 2022-07-19 | Discharge: 2022-07-19 | Disposition: A | Discharge status: Home / Self Care | Payer: Medicare HMO | Attending: Gastroenterology | Admitting: Gastroenterology

## 2022-07-19 ENCOUNTER — Ambulatory Visit (HOSPITAL_BASED_OUTPATIENT_CLINIC_OR_DEPARTMENT_OTHER): Payer: Medicare HMO | Admitting: Anesthesiology

## 2022-07-19 ENCOUNTER — Encounter (HOSPITAL_COMMUNITY)
Admission: RE | Disposition: A | Discharge status: Home / Self Care | Payer: Self-pay | Source: Home / Self Care | Attending: Gastroenterology

## 2022-07-19 ENCOUNTER — Encounter (HOSPITAL_COMMUNITY): Payer: Self-pay | Admitting: Gastroenterology

## 2022-07-19 DIAGNOSIS — K30 Functional dyspepsia: Secondary | ICD-10-CM | POA: Diagnosis not present

## 2022-07-19 DIAGNOSIS — I509 Heart failure, unspecified: Secondary | ICD-10-CM | POA: Diagnosis not present

## 2022-07-19 DIAGNOSIS — I110 Hypertensive heart disease with heart failure: Secondary | ICD-10-CM | POA: Diagnosis not present

## 2022-07-19 DIAGNOSIS — K449 Diaphragmatic hernia without obstruction or gangrene: Secondary | ICD-10-CM | POA: Insufficient documentation

## 2022-07-19 DIAGNOSIS — R1012 Left upper quadrant pain: Secondary | ICD-10-CM | POA: Diagnosis not present

## 2022-07-19 DIAGNOSIS — G935 Compression of brain: Secondary | ICD-10-CM | POA: Insufficient documentation

## 2022-07-19 DIAGNOSIS — I2510 Atherosclerotic heart disease of native coronary artery without angina pectoris: Secondary | ICD-10-CM | POA: Insufficient documentation

## 2022-07-19 DIAGNOSIS — R1013 Epigastric pain: Secondary | ICD-10-CM

## 2022-07-19 DIAGNOSIS — R053 Chronic cough: Secondary | ICD-10-CM

## 2022-07-19 DIAGNOSIS — K319 Disease of stomach and duodenum, unspecified: Secondary | ICD-10-CM | POA: Diagnosis not present

## 2022-07-19 DIAGNOSIS — I5041 Acute combined systolic (congestive) and diastolic (congestive) heart failure: Secondary | ICD-10-CM | POA: Diagnosis not present

## 2022-07-19 DIAGNOSIS — J45909 Unspecified asthma, uncomplicated: Secondary | ICD-10-CM | POA: Diagnosis not present

## 2022-07-19 DIAGNOSIS — K219 Gastro-esophageal reflux disease without esophagitis: Secondary | ICD-10-CM | POA: Diagnosis not present

## 2022-07-19 HISTORY — PX: BIOPSY: SHX5522

## 2022-07-19 HISTORY — PX: ESOPHAGOGASTRODUODENOSCOPY (EGD) WITH PROPOFOL: SHX5813

## 2022-07-19 SURGERY — ESOPHAGOGASTRODUODENOSCOPY (EGD) WITH PROPOFOL
Anesthesia: Monitor Anesthesia Care

## 2022-07-19 MED ORDER — PROPOFOL 10 MG/ML IV BOLUS
INTRAVENOUS | Status: DC | PRN
  Administered 2022-07-19: 30 mg via INTRAVENOUS

## 2022-07-19 MED ORDER — LACTATED RINGERS IV SOLN
INTRAVENOUS | Status: AC | PRN
  Administered 2022-07-19: 10 mL/h via INTRAVENOUS

## 2022-07-19 MED ORDER — PROPOFOL 500 MG/50ML IV EMUL
INTRAVENOUS | Status: AC
  Filled 2022-07-19: qty 50

## 2022-07-19 MED ORDER — PROPOFOL 500 MG/50ML IV EMUL
INTRAVENOUS | Status: DC | PRN
  Administered 2022-07-19: 125 ug/kg/min via INTRAVENOUS

## 2022-07-19 MED ORDER — LACTATED RINGERS IV SOLN: INTRAVENOUS | Status: DC | PRN

## 2022-07-19 MED ORDER — ONDANSETRON HCL 4 MG/2ML IJ SOLN
INTRAMUSCULAR | Status: DC | PRN
  Administered 2022-07-19: 4 mg via INTRAVENOUS

## 2022-07-19 SURGICAL SUPPLY — 15 items

## 2022-07-19 NOTE — Interval H&P Note (Signed)
History and Physical Interval Note:  07/19/2022 9:53 AM  Anne Barnes  has presented today for surgery, with the diagnosis of GERD, chronic cough.  The various methods of treatment have been discussed with the patient and family. After consideration of risks, benefits and other options for treatment, the patient has consented to  Procedure(s): ESOPHAGOGASTRODUODENOSCOPY (EGD) WITH PROPOFOL (N/A) as a surgical intervention.  The patient's history has been reviewed, patient examined, no change in status, stable for surgery.  I have reviewed the patient's chart and labs.  Questions were answered to the patient's satisfaction.     Dominic Pea [name]

## 2022-07-19 NOTE — Transfer of Care (Signed)
Immediate Anesthesia Transfer of Care Note  Patient: Anne Barnes  Procedure(s) Performed: ESOPHAGOGASTRODUODENOSCOPY (EGD) WITH PROPOFOL BIOPSY  Patient Location: Endoscopy Unit  Anesthesia Type:MAC  Level of Consciousness: drowsy  Airway & Oxygen Therapy: Patient Spontanous Breathing and Patient connected to face mask  Post-op Assessment: Report given to RN and Post -op Vital signs reviewed and stable  Post vital signs: Reviewed and stable  Last Vitals:  Vitals Value Taken Time  BP    Temp    Pulse    Resp    SpO2      Last Pain:  Vitals:   07/19/22 1000  TempSrc: Temporal  PainSc: 4          Complications: No notable events documented.

## 2022-07-19 NOTE — Anesthesia Procedure Notes (Signed)
Procedure Name: MAC Date/Time: 07/19/2022 10:10 AM  Performed by: Claudia Desanctis, CRNAPre-anesthesia Checklist: Patient identified, Emergency Drugs available, Suction available and Patient being monitored Patient Re-evaluated:Patient Re-evaluated prior to induction Oxygen Delivery Method: Simple face mask

## 2022-07-19 NOTE — Discharge Instructions (Signed)
YOU HAD AN ENDOSCOPIC PROCEDURE TODAY: Refer to the procedure report and other information in the discharge instructions given to you for any specific questions about what was found during the examination. If this information does not answer your questions, please call [place] office at [phone] to clarify.  ° °YOU SHOULD EXPECT: Some feelings of bloating in the abdomen. Passage of more gas than usual. Walking can help get rid of the air that was put into your GI tract during the procedure and reduce the bloating. If you had a lower endoscopy (such as a colonoscopy or flexible sigmoidoscopy) you may notice spotting of blood in your stool or on the toilet paper. Some abdominal soreness may be present for a day or two, also. ° °DIET: Your first meal following the procedure should be a light meal and then it is ok to progress to your normal diet. A half-sandwich or bowl of soup is an example of a good first meal. Heavy or fried foods are harder to digest and may make you feel nauseous or bloated. Drink plenty of fluids but you should avoid alcoholic beverages for 24 hours. If you had a esophageal dilation, please see attached instructions for diet.   ° °ACTIVITY: Your care partner should take you home directly after the procedure. You should plan to take it easy, moving slowly for the rest of the day. You can resume normal activity the day after the procedure however YOU SHOULD NOT DRIVE, use power tools, machinery or perform tasks that involve climbing or major physical exertion for 24 hours (because of the sedation medicines used during the test).  ° °SYMPTOMS TO REPORT IMMEDIATELY: °A gastroenterologist can be reached at any hour. Please call [phone]  for any of the following symptoms:  °Following lower endoscopy (colonoscopy, flexible sigmoidoscopy) °Excessive amounts of blood in the stool  °Significant tenderness, worsening of abdominal pains  °Swelling of the abdomen that is new, acute  °Fever of 100° or  higher  °Following upper endoscopy (EGD, EUS, ERCP, esophageal dilation) °Vomiting of blood or coffee ground material  °New, significant abdominal pain  °New, significant chest pain or pain under the shoulder blades  °Painful or persistently difficult swallowing  °New shortness of breath  °Black, tarry-looking or red, bloody stools ° °FOLLOW UP:  °If any biopsies were taken you will be contacted by phone or by letter within the next 1-3 weeks. Call [phone]  if you have not heard about the biopsies in 3 weeks.  °Please also call with any specific questions about appointments or follow up tests. ° °

## 2022-07-19 NOTE — Anesthesia Preprocedure Evaluation (Signed)
Anesthesia Evaluation  Patient identified by MRN, date of birth, ID band Patient awake    Reviewed: Allergy & Precautions, NPO status , Patient's Chart, lab work & pertinent test results  Airway Mallampati: II  TM Distance: >3 FB Neck ROM: Full    Dental  (+) Edentulous Upper, Edentulous Lower   Pulmonary asthma , sleep apnea ,    Pulmonary exam normal breath sounds clear to auscultation       Cardiovascular hypertension, Pt. on medications +CHF  Normal cardiovascular exam Rhythm:Regular Rate:Normal     Neuro/Psych  Headaches, negative psych ROS   GI/Hepatic Neg liver ROS, GERD  ,  Endo/Other  Morbid obesity  Renal/GU negative Renal ROS  negative genitourinary   Musculoskeletal  (+) Arthritis , Osteoarthritis,    Abdominal (+) + obese,   Peds negative pediatric ROS (+)  Hematology negative hematology ROS (+)   Anesthesia Other Findings   Reproductive/Obstetrics negative OB ROS                             Anesthesia Physical Anesthesia Plan  ASA: 3  Anesthesia Plan: MAC   Post-op Pain Management: Minimal or no pain anticipated   Induction: Intravenous  PONV Risk Score and Plan: 2 and Ondansetron, Midazolam and Treatment may vary due to age or medical condition  Airway Management Planned: Nasal Cannula  Additional Equipment:   Intra-op Plan:   Post-operative Plan:   Informed Consent: I have reviewed the patients History and Physical, chart, labs and discussed the procedure including the risks, benefits and alternatives for the proposed anesthesia with the patient or authorized representative who has indicated his/her understanding and acceptance.     Dental advisory given  Plan Discussed with: CRNA  Anesthesia Plan Comments:         Anesthesia Quick Evaluation

## 2022-07-19 NOTE — Anesthesia Postprocedure Evaluation (Signed)
Anesthesia Post Note  Patient: Anne Barnes  Procedure(s) Performed: ESOPHAGOGASTRODUODENOSCOPY (EGD) WITH PROPOFOL BIOPSY     Patient location during evaluation: PACU Anesthesia Type: MAC Level of consciousness: awake and alert Pain management: pain level controlled Vital Signs Assessment: post-procedure vital signs reviewed and stable Respiratory status: spontaneous breathing, nonlabored ventilation and respiratory function stable Cardiovascular status: blood pressure returned to baseline and stable Postop Assessment: no apparent nausea or vomiting Anesthetic complications: no   No notable events documented.  Last Vitals:  Vitals:   07/19/22 1037 07/19/22 1047  BP: (!) 144/89 (!) 156/75  Pulse: 77 67  Resp: 14 13  Temp:    SpO2: 97% 99%    Last Pain:  Vitals:   07/19/22 1047  TempSrc:   PainSc: 0-No pain                 Lynda Rainwater

## 2022-07-19 NOTE — H&P (Signed)
GASTROENTEROLOGY PROCEDURE H&P NOTE   Primary Care Physician: Mosie Lukes, MD    Reason for Procedure:  GERD, LUQ pain, dyspepsia, chronic cough  Plan:    EGD  Patient is appropriate for endoscopic procedure(s) at [facility] unit.  The nature of the procedure, as well as the risks, benefits, and alternatives were carefully and thoroughly reviewed with the patient. Ample time for discussion and questions allowed. The patient understood, was satisfied, and agreed to proceed.     HPI: Anne Barnes is a 76 y.o. female who presents for EGD for evaluation of GERD, heartburn, LUQ pain, dyspepsia, chronic cough.  MBS completed 04/2022.   Procedure to be done at [facility] unit today due to elevated periprocedural risks related to underlying comorbidities, to include CHF, obesity, Chiari I malformation, CAD.  Past Medical History:  Diagnosis Date   Acute combined systolic and diastolic heart failure ([place]) 05/26/2015   ALLERGIC RHINITIS    Allergic urticaria 08/01/2014   Patient reports allergies to shrimp, egg whites, tomatoes, dairy    Allergy    seasonal and numerous food and drug allergies.   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   Office Spirometry 11/07/17-WNL-FVC 1.58/82%, FEV1 1.27/85%, ratio 0.80, FEF 25-75% 0.26/89%   Bradycardia 02/25/2017   Chiari malformation    Noted MRI brain 09/2013 - s/p neuro eval for same   DIVERTICULOSIS, COLON    Essential hypertension 08/28/2010   Qualifier: Diagnosis of  By: Marca Ancona RMA, Lucy      Frequent headaches    GERD    H/O measles    H/O mumps    Hearing loss 01/18/2017   History of chicken pox    Hyperglycemia 03/05/2016   HYPERTENSION    Hypocalcemia 02/25/2017   IBS (irritable bowel syndrome) 05/26/2015   Nonspecific abnormal electrocardiogram (ECG) (EKG)    OBSTRUCTIVE SLEEP APNEA 12/2008 dx   noncompliant with CPAP qhs   Obstructive sleep apnea 08/28/2010   NPSG 12/2008:  AHI 13/hr with desats to  78%    Preventative health care 06/11/2017   Sciatica    right side   Seasonal and perennial allergic rhinitis 08/28/2010   Allergy profile 03/11/14- positive especially for dust, cat and dog Food allergy profile- total IgE 86 with several food group elevations Sed rate-WNL      Sinusitis 06/06/2017   Sleep apnea    uses cpap   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 Authorizing Provider  albuterol (VENTOLIN HFA) 108 (90 Base) MCG/ACT inhaler INHALE 2 PUFFS BY MOUTH EVERY 6 HOURS AS NEEDED FOR WHEEZING FOR SHORTNESS OF BREATH 06/21/22  Yes Young, Clinton D, MD  alendronate (FOSAMAX) 70 MG tablet Take 1 tablet (70 mg total) by mouth once a week. Take with a full glass of water on an empty stomach. 12/12/21  Yes Mosie Lukes, MD  ALPRAZolam Duanne Moron) 0.5 MG tablet TAKE 1 TABLET BY MOUTH TWICE DAILY AS NEEDED FOR ANXIETY AND FOR SLEEP 02/16/22  Yes Mosie Lukes, MD  amLODipine (NORVASC) 2.5 MG tablet Take 1 tablet (2.5 mg total) by mouth 2 (two) times  daily. 06/04/22  Yes Mosie Lukes, MD  aspirin EC 81 MG tablet Take 81 mg by mouth daily. Swallow whole.   Yes [provider]  azelastine (ASTELIN) 0.1 % nasal spray Place 2 sprays into both nostrils 2 (two) times daily as needed for rhinitis. 09/21/21  Yes Valentina Shaggy, MD  b complex vitamins tablet Take 1 tablet by mouth daily.   Yes [provider]  Cholecalciferol (VITAMIN D) 50 MCG (2000 UT) tablet Take 2,000 Units by mouth daily.   Yes [provider]  furosemide (LASIX) 20 MG tablet Take 1 tablet (20 mg total) by mouth 3 (three) times a week. Monday, Wednesday and Friday. 04/28/21 07/12/22  Yes Weaver, Scott T, PA-C  levocetirizine (XYZAL) 5 MG tablet TAKE 1 TABLET BY MOUTH ONCE DAILY IN THE EVENING 01/05/22  Yes Valentina Shaggy, MD  losartan (COZAAR) 100 MG tablet Take 1 tablet by mouth once daily 05/11/22  Yes Mosie Lukes, MD  montelukast (SINGULAIR) 10 MG tablet Take 1 tablet (10 mg total) by mouth at bedtime. 06/04/22  Yes Mosie Lukes, MD  Multiple Vitamin (MULTIVITAMIN) tablet Take 1 tablet by mouth daily.   Yes [provider]  olopatadine (PATANOL) 0.1 % ophthalmic solution Place 1 drop into both eyes 2 (two) times daily. Patient taking differently: Place 1 drop into both eyes daily as needed for allergies. 05/18/21  Yes Kennith Gain, MD  omeprazole (PRILOSEC) 40 MG capsule Take 1 capsule by mouth twice daily 06/18/22  Yes Cathan Gearin V, DO  rosuvastatin (CRESTOR) 10 MG tablet Take 1 tablet (10 mg total) by mouth daily. 11/09/21  Yes Freada Bergeron, MD  spironolactone (ALDACTONE) 25 MG tablet Take 1 tablet (25 mg total) by mouth daily. 05/11/22  Yes Freada Bergeron, MD  EPINEPHrine 0.3 mg/0.3 mL IJ SOAJ injection Inject 0.3 mg into the muscle as needed for anaphylaxis. 09/21/21   Valentina Shaggy, MD  famotidine (PEPCID) 40 MG tablet Take 1 tablet (40 mg total) by mouth 2 (two) times daily. Patient not taking: Reported on 05/22/2022 09/21/21   Valentina Shaggy, MD  fluticasone [facility]) 50 MCG/ACT nasal spray Place 2 sprays into both nostrils daily. Patient not taking: Reported on 07/12/2022 11/07/21 11/07/22  Caleen Jobs B, NP    No current facility-administered medications for this encounter.   Facility-Administered Medications Ordered in Other Encounters  Medication Dose Route Frequency Provider Last Rate Last Admin   lactated ringers infusion   Intravenous Continuous PRN Claudia Desanctis, CRNA   New Bag at 07/19/22 0930    Allergies as of 04/27/2022 - Review Complete 04/27/2022  Allergen Reaction Noted    Codeine Nausea And Vomiting 07/17/2011   Eggs or egg-derived products  10/20/2020   Hydralazine hcl Other (See Comments) 04/17/2019   Lisinopril-hydrochlorothiazide Hives    Metoprolol Other (See Comments) 01/18/2017   Other Cough 02/19/2017   Shrimp [shellfish allergy] Other (See Comments) 10/15/2019   Tomato Other (See Comments) 10/15/2019    Family History  Problem Relation Age of Onset   Arthritis Mother        died of complications from hip surgery   Arthritis Father    Kidney disease Father    Pneumonia Father    Heart disease Father    Lupus Daughter    Rheum arthritis Daughter    Sjogren's syndrome Daughter    Fibromyalgia Daughter    Diabetes Brother    Heart disease Brother    Heart  attack Brother    Alcohol abuse Brother    Throat cancer Brother    Lung cancer Brother    Kidney disease Sister        dialysis   Obesity Sister    Hypertension Maternal Grandfather    Hypertension Sister    Kidney disease Sister        dialysis   Pneumonia Brother    Hypertension Daughter    Berenice Primas' disease Daughter    Hypertension Daughter    Alcohol abuse Other        parent   Ataxia Neg Hx    Chorea Neg Hx    Dementia Neg Hx    Mental retardation Neg Hx    Migraines Neg Hx    Multiple sclerosis Neg Hx    Neurofibromatosis Neg Hx    Neuropathy Neg Hx    Parkinsonism Neg Hx    Seizures Neg Hx    Stroke Neg Hx    Colon cancer Neg Hx    Colon polyps Neg Hx    Esophageal cancer Neg Hx    Rectal cancer Neg Hx    Stomach cancer Neg Hx     Social History   Socioeconomic History   Marital status: Widowed    Spouse name: Not on file   Number of children: 5   Years of education: Not on file   Highest education level: Not on file  Occupational History   Occupation: retired  Tobacco Use   Smoking status: Never   Smokeless tobacco: Never   Tobacco comments:    dtr 2 g-kids.  Vaping Use   Vaping Use: Never used  Substance and Sexual Activity   Alcohol use: No     Comment: rare   Drug use: No   Sexual activity: Not Currently  Other Topics Concern   Not on file  Social History Narrative   Right handed   One story with daughter and two grandkids   Social Determinants of Health   Financial Resource Strain: Low Risk  (06/04/2022)   Overall Financial Resource Strain (CARDIA)    Difficulty of Paying Living Expenses: Not hard at all  Food Insecurity: No Food Insecurity (06/04/2022)   Hunger Vital Sign    Worried About Running Out of Food in the Last Year: Never true    Ran Out of Food in the Last Year: Never true  Transportation Needs: No Transportation Needs (06/04/2022)   PRAPARE - Hydrologist (Medical): No    Lack of Transportation (Non-Medical): No  Physical Activity: Insufficiently Active (06/04/2022)   Exercise Vital Sign    Days of Exercise per Week: 4 days    Minutes of Exercise per Session: 30 min  Stress: No Stress Concern Present (06/04/2022)   [place]    Feeling of Stress : Not at all  Social Connections: Moderately Isolated (06/04/2022)   Social Connection and Isolation Panel [NHANES]    Frequency of Communication with Friends and Family: More than three times a week    Frequency of Social Gatherings with Friends and Family: More than three times a week    Attends Religious Services: More than 4 times per year    Active Member of [employer] or Organizations: No    Attends Archivist Meetings: Never    Marital Status: Divorced  Human resources officer Violence: Not At Risk (06/04/2022)   Humiliation, Afraid, Rape, and Kick questionnaire  Fear of Current or Ex-Partner: No    Emotionally Abused: No    Physically Abused: No    Sexually Abused: No    Physical Exam: Vital signs in last 24 hours: '@There'$  were no vitals taken for this visit. GEN: NAD EYE: Sclerae anicteric ENT: MMM CV: Non-tachycardic Pulm: CTA b/l GI: Soft,  NT/ND NEURO:  Alert & Oriented x 3   Gerrit Heck, DO [facility]   07/19/2022 9:47 AM

## 2022-07-19 NOTE — Op Note (Signed)
[facility] Patient Name: Anne Barnes Procedure Date: 07/19/2022 MRN: [id number] Attending MD: Gerrit Heck , MD, [id number] Date of Birth: 1946/06/02 CSN: [id number] Age: 76 Admit Type: Outpatient Procedure:                Upper GI endoscopy Indications:              Abdominal pain in the left upper quadrant,                            Suspected esophageal reflux, Chronic cough,                            Dyspepsia Providers:                Gerrit Heck, MD, Jaci Carrel, RN, Frazier Richards, Technician Referring MD:              Medicines:                Monitored Anesthesia Care Complications:            No immediate complications. Estimated Blood Loss:     Estimated blood loss was minimal. Procedure:                Pre-Anesthesia Assessment:                           - Prior to the procedure, a History and Physical                            was performed, and patient medications and                            allergies were reviewed. The patient's tolerance of                            previous anesthesia was also reviewed. The risks                            and benefits of the procedure and the sedation                            options and risks were discussed with the patient.                            All questions were answered, and informed consent                            was obtained. Prior Anticoagulants: The patient has                            taken no anticoagulant or antiplatelet agents. ASA                            Grade Assessment: III -  A patient with severe                            systemic disease. After reviewing the risks and                            benefits, the patient was deemed in satisfactory                            condition to undergo the procedure.                           After obtaining informed consent, the endoscope was                            passed under direct vision.  Throughout the                            procedure, the patient's blood pressure, pulse, and                            oxygen saturations were monitored continuously. The                            [id number]-H190 ([id number]) Olympus endoscope was introduced                            through the mouth, and advanced to the second part                            of duodenum. The upper GI endoscopy was                            accomplished without difficulty. The patient                            tolerated the procedure well. Scope In: Scope Out: Findings:      A 3 cm hiatal hernia was present.      The upper third of the esophagus, middle third of the esophagus and       lower third of the esophagus were normal.      The entire examined stomach was normal. Biopsies were taken with a cold       forceps for Helicobacter pylori testing. Estimated blood loss was       minimal.      The examined duodenum was normal. Impression:               - 3 cm hiatal hernia.                           - Normal upper third of esophagus, middle third of                            esophagus and lower third of esophagus.                           -  Normal stomach. Biopsied.                           - Normal examined duodenum. Moderate Sedation:      Not Applicable - Patient had care per Anesthesia. Recommendation:           - Patient has a contact number available for                            emergencies. The signs and symptoms of potential                            delayed complications were discussed with the                            patient. Return to normal activities tomorrow.                            Written discharge instructions were provided to the                            patient.                           - Resume previous diet.                           - Continue present medications.                           - Await pathology results.                           - Continue regular  follow-up in the Speech                            Pathology Clinic.                           - Can trial course of OTC FDGuard for non-ulcer                            dyspepsia.                           - Return to GI clinic PRN. Procedure Code(s):        --- Professional ---                           [phone], Esophagogastroduodenoscopy, flexible,                            transoral; with biopsy, single or multiple Diagnosis Code(s):        --- Professional ---                           K44.9, Diaphragmatic hernia without obstruction or  gangrene                           R10.12, Left upper quadrant pain                           R05.3, Chronic cough                           K30, Functional dyspepsia CPT copyright 2022 American Medical Association. All rights reserved. The codes documented in this report are preliminary and upon coder review may  be revised to meet current compliance requirements. Gerrit Heck, MD 07/19/2022 10:29:39 AM Number of Addenda: 0

## 2022-07-20 LAB — SURGICAL PATHOLOGY

## 2022-07-20 NOTE — Progress Notes (Signed)
Called by phone for post-op follow up after EGD on 10/26 with MD Cirigliano. Per pt, she is experiencing intermittent 8/10 upper abdominal pain which she describes as "Sporadic" and states "it only happened once today". Instructed pt to call [facility] office if symptoms persist or worsen. Pt expressed understanding. Staff message sent to MD Cirigliano.  Debarah Crape, RN 07/20/22 4:17 PM

## 2022-07-22 ENCOUNTER — Encounter (HOSPITAL_COMMUNITY): Payer: Self-pay | Admitting: Gastroenterology

## 2022-07-24 ENCOUNTER — Telehealth: Payer: Self-pay

## 2022-07-24 NOTE — Telephone Encounter (Signed)
With continued symptomatology, recommend the following: - Change Prilosec to Nexium 40 mg twice daily x4 weeks.  If symptoms controlled, can try reducing to 40 mg daily.   - If no response at all to high-dose PPI, can titrate off and plan to schedule for Esophageal Manometry and pH/impedance testing (off PPI and H2 blockers)

## 2022-07-24 NOTE — Telephone Encounter (Signed)
-----   Message from [place], DO sent at 07/20/2022  5:22 PM EDT ----- Regarding: RE: Pt reports post-EGD pain I agree w/ your advice to her. Thanks for the heads up on her.   Mickel Baas, can you please call to check in on her Monday and see how she is doing? Thanks.  ----- Message ----- From: Debarah Crape, RN Sent: 07/20/2022   4:14 PM EDT To: Lavena Bullion, DO Subject: Pt reports post-EGD pain                       Good afternoon Dr. Loletha Grayer,  I just completed the post-op follow-up call with Ms. Samarin. She is reporting intermittent 8/10 upper abdominal pain. States that it is "sporadic" and "only happened once today". I advised her to call the office if the pain persists or worsens. Just wanted to let you know as well.  Thank you, Dulcy Fanny, RN [facility]

## 2022-07-24 NOTE — Telephone Encounter (Signed)
Spoke with pt and pt stated that she has no upper abdominal pain and feels a lot better. Pt is concerned about her acid reflux. Pt stated that acid reflux is making her throat sore and omeprazole doesn't seem to be helping. Pt confirmed she is taking medication twice a day and takes 30-60 min before eating.

## 2022-07-25 ENCOUNTER — Other Ambulatory Visit: Payer: Self-pay

## 2022-07-25 MED ORDER — ESOMEPRAZOLE MAGNESIUM 40 MG PO CPDR: 40.0000 mg | DELAYED_RELEASE_CAPSULE | Freq: Two times a day (BID) | ORAL | 0 refills | Status: DC

## 2022-07-25 NOTE — Telephone Encounter (Signed)
Attempted to contact pt but was unable to leave voicemail.  

## 2022-07-25 NOTE — Telephone Encounter (Signed)
Gave pt recommendations. Nexium sent to pt's pharmacy. Pt verbalized understanding.

## 2022-07-26 NOTE — Progress Notes (Deleted)
Cardiology Office Note:    Date:  07/26/2022   ID:  Anne Barnes, DOB 1946-01-13, MRN [id number]  PCP:  Mosie Lukes, MD   [place]  Cardiologist:  Freada Bergeron, MD  Advanced Practice Provider:  Liliane Shi, PA-C Electrophysiologist:  None   Referring MD: Mosie Lukes, MD    History of Present Illness:    Anne Barnes is a 76 y.o. 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 Meda Coffee who now returns to clinic for follow-up.  She was seen in January 2021 for concern of chest pain and a coronary CTA was performed that showed mild nonobstructive CAD and also findings of peribronchovascular nodules in the medial aspect of the right lower lobe with ill-defined margins, likely infectious or inflammatory in etiology.  Repeat CT scan on Jan 26, 2020 showed that those resolved and it confirmed inflammatory etiology.  She was started on Crestor 10 mg daily that she tolerates well.  Saw Dr. Meda Coffee on 08/04/20 where she was mourning the loss of her husband of 7 years. She had mild LE edema but no orthopnea. Her spironolactone was increased to '25mg'$  daily.  Seen in clinic on 04/25/21 by Richardson Dopp where she was doing well. Continuing to have issues with allergies.  Was seen in clinic on 10/2021 where she was having non exertional chest pain that was worsened by laying down or eating meals. Symptoms thought to be GERD in nature.   Was last seen in clinic 01/2022 where she was doing well from a CV perspective. Was having myalgias and was suffering from reflux.  Today, ***  Past Medical History:  Diagnosis Date   Acute combined systolic and diastolic heart failure ([place]) 05/26/2015   ALLERGIC RHINITIS    Allergic urticaria 08/01/2014   Patient reports allergies to shrimp, egg whites, tomatoes, dairy    Allergy    seasonal and numerous food  and drug allergies.   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   Office Spirometry 11/07/17-WNL-FVC 1.58/82%, FEV1 1.27/85%, ratio 0.80, FEF 25-75% 0.26/89%   Bradycardia 02/25/2017   Chiari malformation    Noted MRI brain 09/2013 - s/p neuro eval for same   DIVERTICULOSIS, COLON    Essential hypertension 08/28/2010   Qualifier: Diagnosis of  By: Marca Ancona RMA, Lucy      Frequent headaches    GERD    H/O measles    H/O mumps    Hearing loss 01/18/2017   History of chicken pox    Hyperglycemia 03/05/2016   HYPERTENSION    Hypocalcemia 02/25/2017   IBS (irritable bowel syndrome) 05/26/2015   Nonspecific abnormal electrocardiogram (ECG) (EKG)    OBSTRUCTIVE SLEEP APNEA 12/2008 dx   noncompliant with CPAP qhs   Obstructive sleep apnea 08/28/2010   NPSG 12/2008:  AHI 13/hr with desats to 78%    Preventative health care 06/11/2017   Sciatica    right side   Seasonal and perennial allergic rhinitis 08/28/2010   Allergy profile 03/11/14- positive especially for dust, cat and dog Food allergy profile- total IgE 86 with several food group elevations Sed rate-WNL      Sinusitis 06/06/2017   Sleep apnea    uses cpap   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   Vaginitis 01/18/2017   Vitamin D deficiency 06/11/2017    Past Surgical History:  Procedure Laterality Date   ABDOMINAL  HYSTERECTOMY  1986   BIOPSY  07/19/2022   Procedure: BIOPSY;  Surgeon: Lavena Bullion, DO;  Location: [facility];  Service: Gastroenterology;;   BREAST SURGERY  1973   Breast biopsy   COLONOSCOPY  2011   Dr Collene Mares   ESOPHAGOGASTRODUODENOSCOPY (EGD) WITH PROPOFOL N/A 07/19/2022   Procedure: ESOPHAGOGASTRODUODENOSCOPY (EGD) WITH PROPOFOL;  Surgeon: Lavena Bullion, DO;  Location: [facility];  Service: Gastroenterology;  Laterality: N/A;   UMBILICAL HERNIA REPAIR      Current Medications: No outpatient medications have been marked as taking for the 07/27/22 encounter (Appointment) with Freada Bergeron, MD.      Allergies:   Codeine, Hydralazine hcl, Lisinopril-hydrochlorothiazide, Metoprolol, Other, and Shrimp [shellfish allergy]   Social History   Socioeconomic History   Marital status: Widowed    Spouse name: Not on file   Number of children: 5   Years of education: Not on file   Highest education level: Not on file  Occupational History   Occupation: retired  Tobacco Use   Smoking status: Never   Smokeless tobacco: Never   Tobacco comments:    dtr 2 g-kids.  Vaping Use   Vaping Use: Never used  Substance and Sexual Activity   Alcohol use: No    Comment: rare   Drug use: No   Sexual activity: Not Currently  Other Topics Concern   Not on file  Social History Narrative   Right handed   One story with daughter and two grandkids   Social Determinants of Health   Financial Resource Strain: Low Risk  (06/04/2022)   Overall Financial Resource Strain (CARDIA)    Difficulty of Paying Living Expenses: Not hard at all  Food Insecurity: No Food Insecurity (06/04/2022)   Hunger Vital Sign    Worried About Running Out of Food in the Last Year: Never true    Ran Out of Food in the Last Year: Never true  Transportation Needs: No Transportation Needs (06/04/2022)   PRAPARE - Hydrologist (Medical): No    Lack of Transportation (Non-Medical): No  Physical Activity: Insufficiently Active (06/04/2022)   Exercise Vital Sign    Days of Exercise per Week: 4 days    Minutes of Exercise per Session: 30 min  Stress: No Stress Concern Present (06/04/2022)   [place]    Feeling of Stress : Not at all  Social Connections: Moderately Isolated (06/04/2022)   Social Connection and Isolation Panel [NHANES]    Frequency of Communication with Friends and Family: More than three times a week    Frequency of Social Gatherings with Friends and Family: More than three times a week    Attends Religious Services:  More than 4 times per year    Active Member of [employer] or Organizations: No    Attends Music therapist: Never    Marital Status: Divorced     Family History: The patient's family history includes Alcohol abuse in her brother and another family member; Arthritis in her father and mother; Diabetes in her brother; Fibromyalgia in her daughter; Berenice Primas' disease in her daughter; Heart attack in her brother; Heart disease in her brother and father; Hypertension in her daughter, daughter, maternal grandfather, and sister; Kidney disease in her father, sister, and sister; Lung cancer in her brother; Lupus in her daughter; Obesity in her sister; Pneumonia in her brother and father; Rheum arthritis in her daughter; Sjogren's syndrome in her daughter;  Throat cancer in her brother. There is no history of Ataxia, Chorea, Dementia, Mental retardation, Migraines, Multiple sclerosis, Neurofibromatosis, Neuropathy, Parkinsonism, Seizures, Stroke, Colon cancer, Colon polyps, Esophageal cancer, Rectal cancer, or Stomach cancer.  ROS:   Please see the history of present illness.    Review of Systems  Constitutional:  Positive for malaise/fatigue. Negative for chills and fever.  HENT:  Negative for sore throat.   Eyes:  Negative for blurred vision and redness.  Respiratory:  Positive for shortness of breath.   Cardiovascular:  Positive for leg swelling. Negative for chest pain, palpitations, orthopnea, claudication and PND.  Gastrointestinal:  Positive for heartburn. Negative for melena, nausea and vomiting.  Genitourinary:  Negative for dysuria and flank pain.  Musculoskeletal:  Positive for joint pain, myalgias and neck pain.  Neurological:  Negative for loss of consciousness and headaches.  Endo/Heme/Allergies:  Positive for environmental allergies. Negative for polydipsia.  Psychiatric/Behavioral:  Negative for substance abuse.     EKGs/Labs/Other Studies Reviewed:    The following studies were  reviewed today:  Echo 03/31/2021:  1. Left ventricular ejection fraction, by estimation, is 60 to 65%. The  left ventricle has normal function. The left ventricle has no regional  wall motion abnormalities. Left ventricular diastolic parameters were  normal.   2. Right ventricular systolic function is normal. The right ventricular  size is normal.   3. The mitral valve is normal in structure. Trivial mitral valve  regurgitation. No evidence of mitral stenosis.   4. The aortic valve is tricuspid. Aortic valve regurgitation is not  visualized. No aortic stenosis is present.   5. The inferior vena cava is normal in size with greater than 50%  respiratory variability, suggesting right atrial pressure of 3 mmHg.  Cardiac monitor 12/2019: Sinus bradycardia to sinus tachycardia. Patient had a min HR of 39 bpm, max HR of 167 bpm, and avg HR of 57 bpm. Very short episodes of SVT, no pauses no atrial fibrillation.   Short episodes of SVT, no other significant arrhythmias.  Coronary CTA 11/20/19: FINDINGS: A 100 kV prospective scan was triggered in the descending thoracic aorta at 111 HU's. Axial non-contrast 3 mm slices were carried out through the heart. The data set was analyzed on a dedicated work station and scored using the [place]. Gantry rotation speed was 250 msecs and collimation was .6 mm. No beta blockade and 0.8 mg of sl NTG was given. The 3D data set was reconstructed in 5% intervals of the 67-82 % of the R-R cycle. Diastolic phases were analyzed on a dedicated work station using MPR, MIP and VRT modes. The patient received 80 cc of contrast.   Aorta:  Normal size.  Mild diffuse calcifications.  No dissection.   Aortic Valve:  Trileaflet.  No calcifications.   Coronary Arteries:  Normal coronary origin.  Right dominance.   RCA is a large dominant artery that gives rise to PDA and PLA. There is significant motion, however there is only mild mixed plaque in the proximal  and mid RCA with 25-49% plaque.   Left main is a large artery that gives rise to LAD and LCX arteries. Left main has no plaque.   LAD is a large vessel that has mild calcified plaque in the proximal and mid LAD with stenosis 0-25%. Distal LAD has no significant plaque.   D1 is a very small branch with no plaque.   LCX is a non-dominant artery that gives rise to two OM branches, there is  minimal plaque.   Other findings:   Normal pulmonary vein drainage into the left atrium.   Normal left atrial appendage without a thrombus.   Normal size of the pulmonary artery.   IMPRESSION: 1. Coronary calcium score of 127. This was 44 percentile for age and sex matched control.   2. Normal coronary origin with right dominance.   3. CAD-RADS 2. Mild non-obstructive CAD (25-49%). Consider non-atherosclerotic causes of chest pain. Consider preventive therapy and risk factor modification.  TTE 2018: -------------------------------------------------------------------  Left ventricle:  The cavity size was normal. Systolic function was  normal. The estimated ejection fraction was in the range of 60% to  65%. Wall motion was normal; there were no regional wall motion  abnormalities. There was no evidence of elevated ventricular  filling pressure by Doppler parameters.   -------------------------------------------------------------------  Aortic valve:   Trileaflet; normal thickness leaflets. Mobility was  not restricted.  Doppler:  Transvalvular velocity was within the  normal range. There was no stenosis. There was no regurgitation.     -------------------------------------------------------------------  Aorta:  Aortic root: The aortic root was normal in size.   -------------------------------------------------------------------  Mitral valve:   Structurally normal valve.   Mobility was not  restricted.  Doppler:  Transvalvular velocity was within the normal  range. There was no  evidence for stenosis. There was no  regurgitation.    Peak gradient (D): 4 mm Hg.   -------------------------------------------------------------------  Left atrium:  The atrium was normal in size.   -------------------------------------------------------------------  Right ventricle:  Poorly visualized. The cavity size was normal.  Wall thickness was normal. Systolic function was normal.   -------------------------------------------------------------------  Pulmonic valve:    Structurally normal valve.   Cusp separation was  normal.  Doppler:  Transvalvular velocity was within the normal  range. There was no evidence for stenosis. There was mild  regurgitation.   -------------------------------------------------------------------  Tricuspid valve:   Structurally normal valve.    Doppler:  Transvalvular velocity was within the normal range. There was no  regurgitation.   -------------------------------------------------------------------  Pulmonary artery:   The main pulmonary artery was normal-sized.  Systolic pressure was within the normal range.   -------------------------------------------------------------------  Right atrium:  The atrium was normal in size.   -------------------------------------------------------------------  Pericardium:  There was no pericardial effusion.   -------------------------------------------------------------------  Systemic veins:  Inferior vena cava: The vessel was normal in size.   Myoview 2018: The left ventricular ejection fraction is hyperdynamic (>65%). Nuclear stress EF: 66%. No T wave inversion was noted during stress. There was no ST segment deviation noted during stress. Defect 1: There is a small defect of mild severity.   Small size, mild intensity fixed apical lateral and inferolateral perfusion defect, likely attenuation artifact. No significant reversible ischemia. LVEF 66% with normal wall motion. This is a low risk study.     EKG:  EKG is personally reviewed. 01/24/2022: EKG was not ordered. 10/25/2021: Sinus rhythm. Rate 83 bpm.   Recent Labs: 03/29/2022: ALT 25; BUN 18; Creatinine, Ser 0.90; Hemoglobin 12.8; Platelets 249.0; Potassium 4.2; Sodium 140; TSH 1.92   Recent Lipid Panel    Component Value Date/Time   CHOL 117 03/29/2022 1205   CHOL 116 02/17/2020 0904   TRIG 44.0 03/29/2022 1205   TRIG 55 12/30/2009 0000   HDL 42.30 03/29/2022 1205   HDL 42 02/17/2020 0904   CHOLHDL 3 03/29/2022 1205   VLDL 8.8 03/29/2022 1205   LDLCALC 66 03/29/2022 1205   LDLCALC 64 02/17/2020 0904  Physical Exam:    VS:  There were no vitals taken for this visit.    Wt Readings from Last 3 Encounters:  07/19/22 232 lb (105.2 kg)  06/04/22 230 lb (104.3 kg)  04/27/22 233 lb 6 oz (105.9 kg)     GEN:  Well nourished, well developed in no acute distress HEENT: Normal NECK: No JVD; No carotid bruits CARDIAC: RRR, no murmurs, rubs, gallops RESPIRATORY:  Clear to auscultation without rales, wheezing or rhonchi  ABDOMEN: Soft, non-tender, non-distended MUSCULOSKELETAL:  Warm, trace pedal edema in left (right is always bigger than left)  SKIN: Warm and dry NEUROLOGIC:  Alert and oriented x 3 PSYCHIATRIC:  Normal affect   ASSESSMENT:    No diagnosis found.    PLAN:    In order of problems listed above:  #Chest Pain: #GERD: Suspect related to underlying reflux as it is worse with drinking/eating and not associated with exertion. Overall symptoms have improved. No plans for ischemic work-up at this time as low suspicion for cardiac etiology. Patient is planned to see GI. -Continue management of GERD  #Nonobstructive CAD: Coronary CTA showed coronary calcium of 127 that was 71 percentile February 2021. There was mild nonobstructive CAD. No exertional symptoms. -Continue crestor '10mg'$  daily -Continue ASA '81mg'$  daily -Continue lifestyle modifications  #[place] Diastolic HF: Has mild LE edema today but  no orthopnea or PND. Has been trying to increase her exercise and change her diet to help with weight loss.  -Continue spiro '25mg'$  daily -Continue losartan '100mg'$  daily -Low Na diet -Continue lasix '20mg'$  3x/wk  #HTN: Well controlled at home per patient report. -Continue amlodipine 2.'5mg'$  BID -Continue losartan '100mg'$  daily -Continue spironolactone '25mg'$  daily  #HLD: -Crestor '10mg'$  as above -Continue lifestyle modifications as detailed below  Exercise recommendations: Goal of exercising for at least 30 minutes a day, at least 5 times per week.  Please exercise to a moderate exertion.  This means that while exercising it is difficult to speak in full sentences, however you are not so short of breath that you feel you must stop, and not so comfortable that you can carry on a full conversation.  Exertion level should be approximately a 5/10, if 10 is the most exertion you can perform.  Diet recommendations: Recommend a heart healthy diet such as the Mediterranean diet.  This diet consists of plant based foods, healthy fats, lean meats, olive oil.  It suggests limiting the intake of simple carbohydrates such as white breads, pastries, and pastas.  It also limits the amount of red meat, wine, and dairy products such as cheese that one should consume on a daily basis.   Follow-up: 6 months.  Medication Adjustments/Labs and Tests Ordered: Current medicines are reviewed at length with the patient today.  Concerns regarding medicines are outlined above.   No orders of the defined types were placed in this encounter.  No orders of the defined types were placed in this encounter.  There are no Patient Instructions on file for this visit.  I,Mathew Stumpf,acting as a Education administrator for Freada Bergeron, MD.,have documented all relevant documentation on the behalf of Freada Bergeron, MD,as directed by  Freada Bergeron, MD while in the presence of Freada Bergeron, MD.  I, Freada Bergeron, MD,  have reviewed all documentation for this visit. The documentation on 07/26/22 for the exam, diagnosis, procedures, and orders are all accurate and complete.    Signed, Freada Bergeron, MD  07/26/2022 3:37 PM  [facility]

## 2022-07-27 ENCOUNTER — Encounter: Payer: Self-pay | Admitting: Cardiology

## 2022-07-27 ENCOUNTER — Ambulatory Visit: Payer: Medicare HMO | Attending: Cardiology | Admitting: Cardiology

## 2022-07-27 VITALS — BP 132/62 | HR 90 | Ht 59.0 in | Wt 235.0 lb

## 2022-07-27 DIAGNOSIS — I10 Essential (primary) hypertension: Secondary | ICD-10-CM | POA: Diagnosis not present

## 2022-07-27 DIAGNOSIS — I2510 Atherosclerotic heart disease of native coronary artery without angina pectoris: Secondary | ICD-10-CM | POA: Diagnosis not present

## 2022-07-27 DIAGNOSIS — E7800 Pure hypercholesterolemia, unspecified: Secondary | ICD-10-CM

## 2022-07-27 DIAGNOSIS — K219 Gastro-esophageal reflux disease without esophagitis: Secondary | ICD-10-CM

## 2022-07-27 DIAGNOSIS — I5032 Chronic diastolic (congestive) heart failure: Secondary | ICD-10-CM

## 2022-07-27 DIAGNOSIS — R600 Localized edema: Secondary | ICD-10-CM | POA: Diagnosis not present

## 2022-07-27 DIAGNOSIS — R0602 Shortness of breath: Secondary | ICD-10-CM | POA: Diagnosis not present

## 2022-07-27 NOTE — Patient Instructions (Signed)
Medication Instructions:  Your physician has recommended you make the following change in your medication:  1-Take furosemide extra tablet by mouth daily as need for any edema or shortness of breath.  *If you need a refill on your cardiac medications before your next appointment, please call your pharmacy*  Lab Work: Your physician recommends that you have lab work today- BNP Your physician recommends that you return for lab work in: 1 week for BMET  If you have labs (blood work) drawn today and your tests are completely normal, you will receive your results only by: [place] (if you have [place]) OR A paper copy in the mail If you have any lab test that is abnormal or we need to change your treatment, we will call you to review the results.  Testing/Procedures: Your physician has requested that you have a lower extremity venous duplex TODAY. This test is an ultrasound of the veins in the legs. It looks at venous blood flow that carries blood from the heart to the legs. Allow one hour for a Lower Venous exam. There are no restrictions or special instructions.  Follow-Up: At [facility], you and your health needs are our priority.  As part of our continuing mission to provide you with exceptional heart care, we have created designated Provider Care Teams.  These Care Teams include your primary Cardiologist (physician) and Advanced Practice Providers (APPs -  Physician Assistants and Nurse Practitioners) who all work together to provide you with the care you need, when you need it.  We recommend signing up for the patient portal called "MyChart".  Sign up information is provided on this After Visit Summary.  MyChart is used to connect with patients for Virtual Visits (Telemedicine).  Patients are able to view lab/test results, encounter notes, upcoming appointments, etc.  Non-urgent messages can be sent to your provider as well.   To learn more about what you can do with MyChart,  go to [email or web address].    Your next appointment:   3 month(s)  The format for your next appointment:   In Person  Provider:   Nicholes Rough, PA-C, Melina Copa, PA-C, Ambrose Pancoast, NP, Cecilie Kicks, NP, Ermalinda Barrios, PA-C, Christen Bame, NP, Richardson Dopp, PA-C, or Margie Billet, NP     Important Information About Sugar

## 2022-07-27 NOTE — Progress Notes (Signed)
Cardiology Office Note:    Date:  07/27/2022   ID:  [name], DOB [birth year], MRN [id number]  PCP:  Mosie Lukes, MD   [place]  Cardiologist:  Freada Bergeron, MD  Advanced Practice Provider:  Liliane Shi, PA-C Electrophysiologist:  None   Referring MD: Mosie Lukes, MD    History of Present Illness:    Anne Barnes is a 76 y.o. 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 Meda Barnes who now returns to clinic for follow-up.  She was seen in January 2021 for concern of chest pain and a coronary CTA was performed that showed mild nonobstructive CAD and also findings of peribronchovascular nodules in the medial aspect of the right lower lobe with ill-defined margins, likely infectious or inflammatory in etiology.  Repeat CT scan on Jan 26, 2020 showed that those resolved and it confirmed inflammatory etiology.  She was started on Crestor 10 mg daily that she tolerates well.  Saw Dr. Meda Barnes on 08/04/20 where she was mourning the loss of her husband of 52 years. She had mild LE edema but no orthopnea. Her spironolactone was increased to '25mg'$  daily.  Seen in clinic on 04/25/21 by Richardson Dopp where she was doing well. Continuing to have issues with allergies.  Was seen in clinic on 10/2021 where she was having non exertional chest pain that was worsened by laying down or eating meals. Symptoms thought to be GERD in nature.   Was last seen in clinic 01/2022 where she was doing well from a CV perspective. Was having myalgias and was suffering from reflux.  Today, she says she is doing "lousy." She states that last Friday she woke up with her legs swollen. Her left leg swelling has decreased a little since, but her right leg swelling has remained the same. Her right leg has also been feeling sore lately.  She denies any recent periods of  immobility or travel. Has chronic dyspnea on exertion that is unchanged.  She reports that she experiences a tingling and squeezing sensation in her chest occasionally. During this, she is usually sitting rather than doing anything exertional.  Resolves on it's own without intervention.  Her at home blood pressure is normally around 998P systolic. She states that every now and then she will get readings in the 120s, which is low for her. In these instances, she becomes lightheaded and dizzy.   She denies any palpitations or shortness of breath. No headaches, syncope, orthopnea, or PND. Has been compliant with her medications but would prefer to minimize meds as much as possible.  Past Medical History:  Diagnosis Date   Acute combined systolic and diastolic heart failure ([place]) 05/26/2015   ALLERGIC RHINITIS    Allergic urticaria 08/01/2014   Patient reports allergies to shrimp, egg whites, tomatoes, dairy    Allergy    seasonal and numerous food and drug allergies.   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   Office Spirometry 11/07/17-WNL-FVC 1.58/82%, FEV1 1.27/85%, ratio 0.80, FEF 25-75% 0.26/89%   Bradycardia 02/25/2017   Chiari malformation    Noted MRI brain 09/2013 - s/p neuro eval for same   DIVERTICULOSIS, COLON    Essential hypertension 08/28/2010   Qualifier: Diagnosis of  By: Marca Ancona RMA, Lucy      Frequent headaches    GERD    H/O measles    H/O  mumps    Hearing loss 01/18/2017   History of chicken pox    Hyperglycemia 03/05/2016   HYPERTENSION    Hypocalcemia 02/25/2017   IBS (irritable bowel syndrome) 05/26/2015   Nonspecific abnormal electrocardiogram (ECG) (EKG)    OBSTRUCTIVE SLEEP APNEA 12/2008 dx   noncompliant with CPAP qhs   Obstructive sleep apnea 08/28/2010   NPSG 12/2008:  AHI 13/hr with desats to 78%    Preventative health care 06/11/2017   Sciatica    right side   Seasonal and perennial allergic rhinitis 08/28/2010   Allergy profile 03/11/14- positive  especially for dust, cat and dog Food allergy profile- total IgE 86 with several food group elevations Sed rate-WNL      Sinusitis 06/06/2017   Sleep apnea    uses cpap   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   Vaginitis 01/18/2017   Vitamin D deficiency 06/11/2017    Past Surgical History:  Procedure Laterality Date   ABDOMINAL HYSTERECTOMY  1986   BIOPSY  07/19/2022   Procedure: BIOPSY;  Surgeon: Lavena Bullion, DO;  Location: [facility];  Service: Gastroenterology;;   [place]   Breast biopsy   COLONOSCOPY  2011   Dr Collene Mares   ESOPHAGOGASTRODUODENOSCOPY (EGD) WITH PROPOFOL N/A 07/19/2022   Procedure: ESOPHAGOGASTRODUODENOSCOPY (EGD) WITH PROPOFOL;  Surgeon: Lavena Bullion, DO;  Location: [facility];  Service: Gastroenterology;  Laterality: N/A;   UMBILICAL HERNIA REPAIR      Current Medications: Current Meds  Medication Sig   albuterol (VENTOLIN HFA) 108 (90 Base) MCG/ACT inhaler INHALE 2 PUFFS BY MOUTH EVERY 6 HOURS AS NEEDED FOR WHEEZING FOR SHORTNESS OF BREATH   alendronate (FOSAMAX) 70 MG tablet Take 1 tablet (70 mg total) by mouth once a week. Take with a full glass of water on an empty stomach.   ALPRAZolam (XANAX) 0.5 MG tablet TAKE 1 TABLET BY MOUTH TWICE DAILY AS NEEDED FOR ANXIETY AND FOR SLEEP   amLODipine (NORVASC) 2.5 MG tablet Take 1 tablet (2.5 mg total) by mouth 2 (two) times daily.   aspirin EC 81 MG tablet Take 81 mg by mouth daily. Swallow whole.   azelastine (ASTELIN) 0.1 % nasal spray Place 2 sprays into both nostrils 2 (two) times daily as needed for rhinitis.   b complex vitamins tablet Take 1 tablet by mouth daily.   Cholecalciferol (VITAMIN D) 50 MCG (2000 UT) tablet Take 2,000 Units by mouth daily.   EPINEPHrine 0.3 mg/0.3 mL IJ SOAJ injection Inject 0.3 mg into the muscle as needed for anaphylaxis.   esomeprazole (NEXIUM) 40 MG capsule Take 1 capsule (40 mg total) by mouth 2 (two) times daily before a meal.   levocetirizine (XYZAL) 5  MG tablet TAKE 1 TABLET BY MOUTH ONCE DAILY IN THE EVENING   losartan (COZAAR) 100 MG tablet Take 1 tablet by mouth once daily   montelukast (SINGULAIR) 10 MG tablet Take 1 tablet (10 mg total) by mouth at bedtime.   Multiple Vitamin (MULTIVITAMIN) tablet Take 1 tablet by mouth daily.   olopatadine (PATANOL) 0.1 % ophthalmic solution Place 1 drop into both eyes 2 (two) times daily. (Patient taking differently: Place 1 drop into both eyes daily as needed for allergies.)   rosuvastatin (CRESTOR) 10 MG tablet Take 1 tablet (10 mg total) by mouth daily.   spironolactone (ALDACTONE) 25 MG tablet Take 1 tablet (25 mg total) by mouth daily.     Allergies:   Codeine, Hydralazine hcl, Lisinopril-hydrochlorothiazide, Metoprolol, Other, and Shrimp [shellfish  allergy]   Social History   Socioeconomic History   Marital status: Widowed    Spouse name: Not on file   Number of children: 5   Years of education: Not on file   Highest education level: Not on file  Occupational History   Occupation: retired  Tobacco Use   Smoking status: Never   Smokeless tobacco: Never   Tobacco comments:    dtr 2 g-kids.  Vaping Use   Vaping Use: Never used  Substance and Sexual Activity   Alcohol use: No    Comment: rare   Drug use: No   Sexual activity: Not Currently  Other Topics Concern   Not on file  Social History Narrative   Right handed   One story with daughter and two grandkids   Social Determinants of Health   Financial Resource Strain: Low Risk  (06/04/2022)   Overall Financial Resource Strain (CARDIA)    Difficulty of Paying Living Expenses: Not hard at all  Food Insecurity: No Food Insecurity (06/04/2022)   Hunger Vital Sign    Worried About Running Out of Food in the Last Year: Never true    Ran Out of Food in the Last Year: Never true  Transportation Needs: No Transportation Needs (06/04/2022)   PRAPARE - Hydrologist (Medical): No    Lack of Transportation  (Non-Medical): No  Physical Activity: Insufficiently Active (06/04/2022)   Exercise Vital Sign    Days of Exercise per Week: 4 days    Minutes of Exercise per Session: 30 min  Stress: No Stress Concern Present (06/04/2022)   [place]    Feeling of Stress : Not at all  Social Connections: Moderately Isolated (06/04/2022)   Social Connection and Isolation Panel [NHANES]    Frequency of Communication with Friends and Family: More than three times a week    Frequency of Social Gatherings with Friends and Family: More than three times a week    Attends Religious Services: More than 4 times per year    Active Member of [employer] or Organizations: No    Attends Music therapist: Never    Marital Status: Divorced     Family History: The patient's family history includes Alcohol abuse in her brother and another family member; Arthritis in her father and mother; Diabetes in her brother; Fibromyalgia in her daughter; Berenice Primas' disease in her daughter; Heart attack in her brother; Heart disease in her brother and father; Hypertension in her daughter, daughter, maternal grandfather, and sister; Kidney disease in her father, sister, and sister; Lung cancer in her brother; Lupus in her daughter; Obesity in her sister; Pneumonia in her brother and father; Rheum arthritis in her daughter; Sjogren's syndrome in her daughter; Throat cancer in her brother. There is no history of Ataxia, Chorea, Dementia, Mental retardation, Migraines, Multiple sclerosis, Neurofibromatosis, Neuropathy, Parkinsonism, Seizures, Stroke, Colon cancer, Colon polyps, Esophageal cancer, Rectal cancer, or Stomach cancer.  ROS:   Please see the history of present illness.    Review of Systems  Constitutional:  Positive for malaise/fatigue. Negative for chills and fever.  HENT:  Negative for sore throat.   Eyes:  Negative for blurred vision and redness.   Respiratory:  Positive for shortness of breath.   Cardiovascular:  Positive for leg swelling. Negative for chest pain, palpitations, orthopnea, claudication and PND.  Gastrointestinal:  Positive for heartburn. Negative for melena, nausea and vomiting.  Genitourinary:  Negative for  dysuria and flank pain.  Musculoskeletal:  Positive for joint pain, myalgias and neck pain.  Neurological:  Positive for dizziness. Negative for loss of consciousness and headaches.  Endo/Heme/Allergies:  Positive for environmental allergies. Negative for polydipsia.  Psychiatric/Behavioral:  Negative for substance abuse.     EKGs/Labs/Other Studies Reviewed:    The following studies were reviewed today:  Echo 03/31/2021:  1. Left ventricular ejection fraction, by estimation, is 60 to 65%. The  left ventricle has normal function. The left ventricle has no regional  wall motion abnormalities. Left ventricular diastolic parameters were  normal.   2. Right ventricular systolic function is normal. The right ventricular  size is normal.   3. The mitral valve is normal in structure. Trivial mitral valve  regurgitation. No evidence of mitral stenosis.   4. The aortic valve is tricuspid. Aortic valve regurgitation is not  visualized. No aortic stenosis is present.   5. The inferior vena cava is normal in size with greater than 50%  respiratory variability, suggesting right atrial pressure of 3 mmHg.  Cardiac monitor 12/2019: Sinus bradycardia to sinus tachycardia. Patient had a min HR of 39 bpm, max HR of 167 bpm, and avg HR of 57 bpm. Very short episodes of SVT, no pauses no atrial fibrillation.   Short episodes of SVT, no other significant arrhythmias.  Coronary CTA 11/20/19: FINDINGS: A 100 kV prospective scan was triggered in the descending thoracic aorta at 111 HU's. Axial non-contrast 3 mm slices were carried out through the heart. The data set was analyzed on a dedicated work station and scored using the  Bertie. Gantry rotation speed was 250 msecs and collimation was .6 mm. No beta blockade and 0.8 mg of sl NTG was given. The 3D data set was reconstructed in 5% intervals of the 67-82 % of the R-R cycle. Diastolic phases were analyzed on a dedicated work station using MPR, MIP and VRT modes. The patient received 80 cc of contrast.   Aorta:  Normal size.  Mild diffuse calcifications.  No dissection.   Aortic Valve:  Trileaflet.  No calcifications.   Coronary Arteries:  Normal coronary origin.  Right dominance.   RCA is a large dominant artery that gives rise to PDA and PLA. There is significant motion, however there is only mild mixed plaque in the proximal and mid RCA with 25-49% plaque.   Left main is a large artery that gives rise to LAD and LCX arteries. Left main has no plaque.   LAD is a large vessel that has mild calcified plaque in the proximal and mid LAD with stenosis 0-25%. Distal LAD has no significant plaque.   D1 is a very small branch with no plaque.   LCX is a non-dominant artery that gives rise to two OM branches, there is minimal plaque.   Other findings:   Normal pulmonary vein drainage into the left atrium.   Normal left atrial appendage without a thrombus.   Normal size of the pulmonary artery.   IMPRESSION: 1. Coronary calcium score of 127. This was 14 percentile for age and sex matched control.   2. Normal coronary origin with right dominance.   3. CAD-RADS 2. Mild non-obstructive CAD (25-49%). Consider non-atherosclerotic causes of chest pain. Consider preventive therapy and risk factor modification.  TTE 2018: -------------------------------------------------------------------  Left ventricle:  The cavity size was normal. Systolic function was  normal. The estimated ejection fraction was in the range of 60% to  65%. Wall motion was normal; there  were no regional wall motion  abnormalities. There was no evidence of elevated ventricular   filling pressure by Doppler parameters.   -------------------------------------------------------------------  Aortic valve:   Trileaflet; normal thickness leaflets. Mobility was  not restricted.  Doppler:  Transvalvular velocity was within the  normal range. There was no stenosis. There was no regurgitation.     -------------------------------------------------------------------  Aorta:  Aortic root: The aortic root was normal in size.   -------------------------------------------------------------------  Mitral valve:   Structurally normal valve.   Mobility was not  restricted.  Doppler:  Transvalvular velocity was within the normal  range. There was no evidence for stenosis. There was no  regurgitation.    Peak gradient (D): 4 mm Hg.   -------------------------------------------------------------------  Left atrium:  The atrium was normal in size.   -------------------------------------------------------------------  Right ventricle:  Poorly visualized. The cavity size was normal.  Wall thickness was normal. Systolic function was normal.   -------------------------------------------------------------------  Pulmonic valve:    Structurally normal valve.   Cusp separation was  normal.  Doppler:  Transvalvular velocity was within the normal  range. There was no evidence for stenosis. There was mild  regurgitation.   -------------------------------------------------------------------  Tricuspid valve:   Structurally normal valve.    Doppler:  Transvalvular velocity was within the normal range. There was no  regurgitation.   -------------------------------------------------------------------  Pulmonary artery:   The main pulmonary artery was normal-sized.  Systolic pressure was within the normal range.   -------------------------------------------------------------------  Right atrium:  The atrium was normal in size.    -------------------------------------------------------------------  Pericardium:  There was no pericardial effusion.   -------------------------------------------------------------------  Systemic veins:  Inferior vena cava: The vessel was normal in size.   Myoview 2018: The left ventricular ejection fraction is hyperdynamic (>65%). Nuclear stress EF: 66%. No T wave inversion was noted during stress. There was no ST segment deviation noted during stress. Defect 1: There is a small defect of mild severity.   Small size, mild intensity fixed apical lateral and inferolateral perfusion defect, likely attenuation artifact. No significant reversible ischemia. LVEF 66% with normal wall motion. This is a low risk study.    EKG:  EKG is personally reviewed. 07/27/2022: EKG was not ordered. 01/24/2022: EKG was not ordered. 10/25/2021: Sinus rhythm. Rate 83 bpm.   Recent Labs: 03/29/2022: ALT 25; BUN 18; Creatinine, Ser 0.90; Hemoglobin 12.8; Platelets 249.0; Potassium 4.2; Sodium 140; TSH 1.92   Recent Lipid Panel    Component Value Date/Time   CHOL 117 03/29/2022 1205   CHOL 116 02/17/2020 0904   TRIG 44.0 03/29/2022 1205   TRIG 55 12/30/2009 0000   HDL 42.30 03/29/2022 1205   HDL 42 02/17/2020 0904   CHOLHDL 3 03/29/2022 1205   VLDL 8.8 03/29/2022 1205   LDLCALC 66 03/29/2022 1205   LDLCALC 64 02/17/2020 0904     Physical Exam:    VS:  BP 132/62   Pulse 90   Ht '4\' 11"'$  (1.499 m)   Wt 235 lb (106.6 kg)   SpO2 95%   BMI 47.46 kg/m     Wt Readings from Last 3 Encounters:  07/27/22 235 lb (106.6 kg)  07/19/22 232 lb (105.2 kg)  06/04/22 230 lb (104.3 kg)     GEN:  Well nourished, well developed in no acute distress HEENT: Normal NECK: No JVD; No carotid bruits CARDIAC: RRR, no murmurs, rubs, gallops RESPIRATORY:  Diffuse, faint expiratory wheezing. Good air movement. ABDOMEN: Obese, soft, NTTP MUSCULOSKELETAL:  R>L LE edema, warm SKIN:  Warm and dry NEUROLOGIC:   Alert and oriented x 3 PSYCHIATRIC:  Normal affect   ASSESSMENT:    1. Bilateral leg edema   2. SOB (shortness of breath)   3. Coronary artery disease involving native coronary artery of native heart without angina pectoris   4. Morbid obesity ([place])   5. Essential hypertension   6. Chronic heart failure with preserved ejection fraction (HCC)   7. Gastroesophageal reflux disease, unspecified whether esophagitis present   8. Pure hypercholesterolemia     PLAN:    In order of problems listed above:  #R>L LE Edema: Patient has been having R>>L LE edema since Friday. No recent episodes of immobility but she is not very mobile at baseline. While she has underlying diastolic HF and is on amlodipine, want to rule out DVT. Will check LE doppler today and BNP. If doppler negative and BNP normal, will plan to transition off amlodipine. -Check LE dopplers -Check BNP -IF reassuring, can stop amlodipine and change losartan to valsartan  #[place] Diastolic HF: TTE 30/1601 with LVEF 60-65%. Has R>L LE edema today as detailed above. Will check BNP and adjust medications accordingly. -Work-up R>L LE edema as above -Check BNP -Increase lasix to '20mg'$  daily until LE edema resolves -Continue spiro '25mg'$  daily -Continue losartan '100mg'$  daily -Low Na diet  #Chest Pain: #GERD: Suspect related to underlying reflux as it is worse with drinking/eating and not associated with exertion. Overall symptoms have improved. No plans for ischemic work-up at this time as low suspicion for cardiac etiology.  -Continue management of GERD  #Nonobstructive CAD: Coronary CTA showed coronary calcium of 127 that was 71 percentile February 2021. There was mild nonobstructive CAD. No exertional symptoms. -Continue crestor '10mg'$  daily -Continue ASA '81mg'$  daily -Continue lifestyle modifications  #HTN: Well controlled at home per patient report. -Continue amlodipine 2.'5mg'$  BID; may need to stop pending work-up as above for  LE edema -Continue losartan '100mg'$  daily -Continue spironolactone '25mg'$  daily  #HLD: -Crestor '10mg'$  as above  #Morbid Obesity: -Continue lifestyle modifications as detailed below -Cannot afford GLP-1 agonist  Exercise recommendations: Goal of exercising for at least 30 minutes a day, at least 5 times per week.  Please exercise to a moderate exertion.  This means that while exercising it is difficult to speak in full sentences, however you are not so short of breath that you feel you must stop, and not so comfortable that you can carry on a full conversation.  Exertion level should be approximately a 5/10, if 10 is the most exertion you can perform.  Diet recommendations: Recommend a heart healthy diet such as the Mediterranean diet.  This diet consists of plant based foods, healthy fats, lean meats, olive oil.  It suggests limiting the intake of simple carbohydrates such as white breads, pastries, and pastas.  It also limits the amount of red meat, wine, and dairy products such as cheese that one should consume on a daily basis.   Follow-up: 3 month with APP.   Medication Adjustments/Labs and Tests Ordered: Current medicines are reviewed at length with the patient today.  Concerns regarding medicines are outlined above.   Orders Placed This Encounter  Procedures   Basic metabolic panel   Pro b natriuretic peptide (BNP)   VAS [place] LOWER EXTREMITY VENOUS (DVT)   No orders of the defined types were placed in this encounter.  Patient Instructions  Medication Instructions:  Your physician has recommended you make the following change in your medication:  1-Take furosemide  extra tablet by mouth daily as need for any edema or shortness of breath.  *If you need a refill on your cardiac medications before your next appointment, please call your pharmacy*  Lab Work: Your physician recommends that you have lab work today- BNP Your physician recommends that you return for lab work in: 1 week  for BMET  If you have labs (blood work) drawn today and your tests are completely normal, you will receive your results only by: [place] (if you have [place]) OR A paper copy in the mail If you have any lab test that is abnormal or we need to change your treatment, we will call you to review the results.  Testing/Procedures: Your physician has requested that you have a lower extremity venous duplex TODAY. This test is an ultrasound of the veins in the legs. It looks at venous blood flow that carries blood from the heart to the legs. Allow one hour for a Lower Venous exam. There are no restrictions or special instructions.  Follow-Up: At [facility], you and your health needs are our priority.  As part of our continuing mission to provide you with exceptional heart care, we have created designated Provider Care Teams.  These Care Teams include your primary Cardiologist (physician) and Advanced Practice Providers (APPs -  Physician Assistants and Nurse Practitioners) who all work together to provide you with the care you need, when you need it.  We recommend signing up for the patient portal called "MyChart".  Sign up information is provided on this After Visit Summary.  MyChart is used to connect with patients for Virtual Visits (Telemedicine).  Patients are able to view lab/test results, encounter notes, upcoming appointments, etc.  Non-urgent messages can be sent to your provider as well.   To learn more about what you can do with MyChart, go to [email or web address].    Your next appointment:   3 month(s)  The format for your next appointment:   In Person  Provider:   Nicholes Rough, PA-C, Melina Copa, PA-C, Ambrose Pancoast, NP, Cecilie Kicks, NP, Ermalinda Barrios, PA-C, Christen Bame, NP, Richardson Dopp, PA-C, or Margie Billet, NP     Important Information About Sugar         I,Breanna Adamick,acting as a scribe for Freada Bergeron, MD.,have documented all relevant  documentation on the behalf of Freada Bergeron, MD,as directed by  Freada Bergeron, MD while in the presence of Freada Bergeron, MD.   I, Freada Bergeron, MD, have reviewed all documentation for this visit. The documentation on 07/27/22 for the exam, diagnosis, procedures, and orders are all accurate and complete.    Signed, Freada Bergeron, MD  07/27/2022 11:47 AM    [facility]

## 2022-07-28 LAB — PRO B NATRIURETIC PEPTIDE: NT-Pro BNP: 45 pg/mL (ref 0–301)

## 2022-07-30 ENCOUNTER — Ambulatory Visit (HOSPITAL_COMMUNITY)
Admission: RE | Admit: 2022-07-30 | Discharge: 2022-07-30 | Disposition: A | Discharge status: Home / Self Care | Payer: Medicare HMO | Source: Ambulatory Visit | Attending: Cardiology | Admitting: Cardiology

## 2022-07-30 DIAGNOSIS — R0602 Shortness of breath: Secondary | ICD-10-CM

## 2022-07-30 DIAGNOSIS — R600 Localized edema: Secondary | ICD-10-CM

## 2022-08-01 ENCOUNTER — Ambulatory Visit: Payer: Medicare HMO | Attending: Family Medicine

## 2022-08-01 DIAGNOSIS — R1313 Dysphagia, pharyngeal phase: Secondary | ICD-10-CM | POA: Diagnosis not present

## 2022-08-01 NOTE — Therapy (Signed)
[facility] OUTPATIENT SPEECH LANGUAGE PATHOLOGY SWALLOWING TREATMENT/ RECERTIFICATION   Patient Name: Anne Barnes MRN: [id number] DOB:1946-09-14, 76 y.o., female Today's Date: 08/01/2022  PCP: Gwyneth Revels, MD REFERRING PROVIDER: Lavena Bullion, MD:    End of Session - 08/01/22 1421     Visit Number 9    Number of Visits 13    Date for SLP Re-Evaluation 09/07/22    SLP Start Time 53   late   SLP Stop Time  1445    SLP Time Calculation (min) 35 min    Activity Tolerance Patient tolerated treatment well                  Past Medical History:  Diagnosis Date   Acute combined systolic and diastolic heart failure ([place]) 05/26/2015   ALLERGIC RHINITIS    Allergic urticaria 08/01/2014   Patient reports allergies to shrimp, egg whites, tomatoes, dairy    Allergy    seasonal and numerous food and drug allergies.   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   Office Spirometry 11/07/17-WNL-FVC 1.58/82%, FEV1 1.27/85%, ratio 0.80, FEF 25-75% 0.26/89%   Bradycardia 02/25/2017   Chiari malformation    Noted MRI brain 09/2013 - s/p neuro eval for same   DIVERTICULOSIS, COLON    Essential hypertension 08/28/2010   Qualifier: Diagnosis of  By: Marca Ancona RMA, Lucy      Frequent headaches    GERD    H/O measles    H/O mumps    Hearing loss 01/18/2017   History of chicken pox    Hyperglycemia 03/05/2016   HYPERTENSION    Hypocalcemia 02/25/2017   IBS (irritable bowel syndrome) 05/26/2015   Nonspecific abnormal electrocardiogram (ECG) (EKG)    OBSTRUCTIVE SLEEP APNEA 12/2008 dx   noncompliant with CPAP qhs   Obstructive sleep apnea 08/28/2010   NPSG 12/2008:  AHI 13/hr with desats to 78%    Preventative health care 06/11/2017   Sciatica    right side   Seasonal and perennial allergic rhinitis 08/28/2010   Allergy profile 03/11/14- positive especially for dust, cat and dog Food allergy profile- total IgE 86 with several food group elevations Sed rate-WNL      Sinusitis 06/06/2017    Sleep apnea    uses cpap   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   Vaginitis 01/18/2017   Vitamin D deficiency 06/11/2017   Past Surgical History:  Procedure Laterality Date   ABDOMINAL HYSTERECTOMY  1986   BIOPSY  07/19/2022   Procedure: BIOPSY;  Surgeon: Lavena Bullion, DO;  Location: [facility];  Service: Gastroenterology;;   [place]   Breast biopsy   COLONOSCOPY  2011   Dr Collene Mares   ESOPHAGOGASTRODUODENOSCOPY (EGD) WITH PROPOFOL N/A 07/19/2022   Procedure: ESOPHAGOGASTRODUODENOSCOPY (EGD) WITH PROPOFOL;  Surgeon: Lavena Bullion, DO;  Location: [facility];  Service: Gastroenterology;  Laterality: N/A;   UMBILICAL HERNIA REPAIR     Patient Active Problem List   Diagnosis Date Noted   Dyspepsia    LUQ pain    Chronic cough    Neck pain on left side 12/13/2021   Congestion of throat 12/13/2021   Osteoporosis 12/13/2021   COVID 04/20/2021   Chest pain of uncertain etiology 45/36/4680   Allergies 12/22/2019   Dizziness 11/19/2019   Grief reaction 08/27/2019   Left shoulder pain 08/27/2019   Hyperlipidemia 07/14/2018   Asthma, mild intermittent 06/06/2018   Morbid obesity ([place]) 01/09/2018   Vitamin D deficiency 06/11/2017   Preventative health care 06/11/2017  Hypocalcemia 02/25/2017   Bradycardia 02/25/2017   Hearing loss 01/18/2017   Hyperglycemia 03/05/2016   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   CHF (congestive heart failure) ([place]) 05/26/2015   Female bladder prolapse 05/26/2015   IBS (irritable bowel syndrome) 05/26/2015   History of chicken pox    Allergic urticaria 08/01/2014   Food allergy 08/01/2014   Tinnitus 03/14/2014   Osteoarthritis    Nonspecific abnormal electrocardiogram (ECG) (EKG)    Headache 10/21/2013   Chiari malformation    GERD 11/10/2010   Obstructive sleep apnea 08/28/2010   Essential hypertension 08/28/2010   Seasonal and perennial allergic rhinitis 08/28/2010   DIVERTICULOSIS, COLON 08/28/2010    ONSET DATE: "Two  years ago"   REFERRING DIAG: R13.10 (ICD-10-CM) - Dysphagia, unspecified type  THERAPY DIAG:  Dysphagia, pharyngeal phase  Rationale for Evaluation and Treatment Rehabilitation  SUBJECTIVE:   SUBJECTIVE STATEMENT: "They changed my medicine (Nexium), Lizet Kelso." Pt accompanied by: self  PERTINENT HISTORY: Anne Barnes is a 76 y.o. female with PMH: allergic rhinitis, mild asthma, OSA, CHF, nonobstructive CAD on coronary CTA, HTN, obesity, bradycardia, chiari malformation, GERD followed by GI. Per 04/27/22 GI note, patient reporting her reflux symptoms started in late 2022 and included heartburn, intermittent chest pain lasting up to a few hours, increased belching, can have associated dyspepsia. She was started on Prilosec 40 mg for four weeks with plan to titrate to lowes effective dose; EGD recommended but has not been completed.   PAIN:  Are you having pain? No   PATIENT GOALS  Regain normal swallow function  OBJECTIVE:   DIAGNOSTIC FINDINGS:  MRI HEAD WITHOUT CONTRAST  01/28/20 TECHNIQUE: Multiplanar, multiecho pulse sequences of the brain and surrounding structures were obtained without intravenous contrast. COMPARISON:  Head CT November 24, 2019. FINDINGS: Brain: No acute infarction, hemorrhage, hydrocephalus, extra-axial collection or mass lesion. Few scattered foci of T2 hyperintensity are seen within the white matter of the cerebral hemispheres, nonspecific. Prominence of the cerebral sulci in the bilateral parietal regions and prominence of the supratentorial ventricles which has progressed since prior MRI performed in September 30, 2013, but is stable from prior CT. 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 Vascular: Normal flow voids. Skull and upper cervical spine: Normal marrow signal. Sinuses/Orbits: Mild mucosal thickening of the ethmoid cells. The orbits are maintained. IMPRESSION: 1. No acute intracranial abnormality. 2. Prominence of the  cerebral sulci in the bilateral parietal regions and prominence of the supratentorial ventricles which has progressed since prior MRI performed in September 30, 2013, but is stable from prior CT performed in September 30, 2013. 3. Chiari 1 malformation.  RESULTS FROM OBJECTIVE SWALLOW STUDY (MBSS/FEES):   Joscelin Fray presents with a normal oral phase of swallow but a moderately impaired pharyngeal phase of swallow. During pharyngeal phase she exhibited swallow initiation delay with thin liquids and at times with nectar thick liquids. She exhibited consistent aspiration during the swallow with cup sips of thin liquids and straw sips of nectar thick liquids. Aspiration occured without patient sensing until after aspirate had transited past vocal cords. She did then sense aspiration with a cough but this did not aid in expelling aspirate. When liquids were transiting, portion of liquid bolus moves under epiglottis before it comes into full contact to protect airway. She did not exhibit any aspiration when taking small sips of nectar thick liquids. No significant difficulties with puree, regular, 91m barium tablet or honey thick liquids. Aspiration appears to be impacted by reduced epiglottic deflection as pharyngeal  space is more narrow (?pharyngeal edema) and cannot r/o impact from appearance of prominent cricopharyngeal bar.  When liquids transiting, portion of liquid bolus moves under epiglottis before it comes into full contact to protect airway. SLP is recommending nectar thick liquids and continue with solids as she has been. Ms Kapusta was given a sample box of Simply Thick food thickener (nectar consistency). SLP recommending OP SLP for dysphagia and patient's PCP contacted via secure EPIC message.     PATIENT REPORTED OUTCOME MEASURES (PROM): EAT-10: completed with pt. Score was 12. "4" reported with #2 (ability to go out for meals), and #3 (swallowing liquids takes extra effort); "2" reported with #9  (coughing when I eat/drink) and #10 (swallowing is stressful).    TODAY'S TREATMENT:  08/01/22: Pt returns today after anxiety attack and medical procedure (EGD). Recertification today. She completed HEP regularly since last session and actually does more exercises from memory than requiring the sheet to look off of. Pt was mod I with procedure today, and followed precautions independently. If pt has same success next session possible decr in frequency will be considered.  07/04/22: Najma states that she feels like the exercises are going better when she has her new dentures placed. Today she does not have them placed due to being adjusted.  Pt stated, "I'm thinking that I might need to just make (HEP) a part of my life from now on, and that's ok." Her procedure was WNL for all exercises. Pt stated she was doing 5 of effortful and then waiting 15 minutes and doing 5 more. SLP told pt rest no more than 2 minutes between 5 rep sets. She demo'd understanding. SLP told pt she could add 2-5 more reps to any exercise that after 10 reps it feels like pt could do more reps.  Pt procedure was excellent today and SLP and pt agreed she could return every 2 weeks, with target follow up MBS date the week of 08-06-22..   06/20/22: Pt completed her HEP with modified independence - SLP provided reassurance she was performing correctly. Pt did 5-7 of each exercise without cues necessary but pt cont'd to look at her HEP sheet for assistance. Pt continues to have nectar-liquids at home. Performed HEP with touching her tongue to water if necessary to have something wet to swallow in order to perform HEP. If pt looks the same next session we will decr to once/week.   06/13/22: Pt reports "some mornings I just wake up very mucousy. Today I am." SLP hears lung/pharyngeal congestion today. Sheritha reports hypersensitivity in her throat and nose/lungs is evident when this occurs. Coughing with 65% of [place]. "It's not because  it's going down the wrong way, it's because of that mucous." Pt performed HEP correctly 95% of the time but cont'd to say, "That wasn't right" for approx 25% of her attempts. SLP cont'd to encourage pt that her procedure was correct 95% of the time, and she corrected herself with SLP A when she was incorrect.   06/08/22: Pt again req'd rare min A for HEP procedure but was modified independent by session end. Often she stated that the rep was not good but it was WNL with slight modification (e.g., air escape on supraglottic). Svea told SLP she was primarily performing [place] and supraglottic and SLP encouraged her to perform all.   06/06/22: Pt req'd min A initially for [facility] but was independent after x6 reps. Otherwise pt was modified independent with HEP. Pt stated, "I know this  is helping, my swallowing is different and better." Pt told SLP rationale for nectar liquids correctly.  PATIENT EDUCATION: Education details: see above in "today's treatment" Person educated: Patient Education method: Explanation, Demonstration, Verbal cues, and Handouts Education comprehension: verbalized understanding, returned demonstration, verbal cues required, and needs further education   ASSESSMENT:  CLINICAL IMPRESSION: Recertification today. LTG added for follow up MBS when clinically indicated. Patient was seen today for swallowing therapy after a MBS on 05/16/22. Pt without notable neurological history, so SLP suspects pharyngeal dysphagia has a deconditioning component. There is also question esophageal component - underwent EGD procedure 07-21-22 and her reflux med changed to Nexium from Omeprazole. Pt continues as highly motivated. Today pt again req'd modified independence/independence. SLP/pt agreed frequency could be back at once/week and could decr to one every other week if progress cont next session.  Pt will need MBS in next cert period - if agreed, please co-sign this plan - thank  you.  OBJECTIVE IMPAIRMENTS include dysphagia. These impairments are limiting patient from safety when swallowing. Factors affecting potential to achieve goals and functional outcome are co-morbidities (esophageal component?). Patient will benefit from skilled SLP services to address above impairments and improve overall function.  REHAB POTENTIAL: Good   GOALS: Goals reviewed with patient? Yes  SHORT TERM GOALS: Target date: 06/19/2022    Pt will perform HEP with rare min A in 2 sessions Baseline: 06-06-22 Goal status: Met  2.  Pt will tell SLP 3 overt s/s aspiration PNA with modified independence in two sessions Baseline: 06/06/22, 06-11-22 Goal status: Met  3.  Pt will tell SLP rationale for nectar liquids in two sessions Baseline: 05/30/22 Goal status: Met   LONG TERM GOALS: Target date: 07/24/2022    Pt will perform HEP with modified independence in 4 sessions Baseline: 06-20-22, 08-01-22 Goal status: Revised  2.  Pt will participate in follow up MBS when clinically indicated  Baseline:  Goal Status: Ongoing   PLAN: SLP FREQUENCY: x1/week  SLP DURATION: other: 5 weeks  PLANNED INTERVENTIONS: Aspiration precaution training, Pharyngeal strengthening exercises, Diet toleration management , Trials of upgraded texture/liquids, Internal/external aids, and Functional tasks    [facility], [place] 08/01/2022, 2:41 PM

## 2022-08-02 ENCOUNTER — Telehealth: Payer: Self-pay | Admitting: *Deleted

## 2022-08-02 DIAGNOSIS — I2510 Atherosclerotic heart disease of native coronary artery without angina pectoris: Secondary | ICD-10-CM

## 2022-08-02 DIAGNOSIS — R600 Localized edema: Secondary | ICD-10-CM

## 2022-08-02 DIAGNOSIS — I10 Essential (primary) hypertension: Secondary | ICD-10-CM

## 2022-08-02 DIAGNOSIS — Z79899 Other long term (current) drug therapy: Secondary | ICD-10-CM

## 2022-08-02 DIAGNOSIS — I5032 Chronic diastolic (congestive) heart failure: Secondary | ICD-10-CM

## 2022-08-02 MED ORDER — VALSARTAN 320 MG PO TABS: 320.0000 mg | ORAL_TABLET | Freq: Every day | ORAL | 1 refills | Status: DC

## 2022-08-02 NOTE — Telephone Encounter (Signed)
[name], LPN 10/27/3433  6:86 PM EST Back to Top    Also per Dr. Johney Frame, pt does not have to come in for BMET that was previously scheduled for 11/10, she can come and get this done in 7 days, with new start Valsartan.   Will endorse to the pt when she returns a call back.   [name], LPN 16/04/3728  0:21 PM EST     Spoke with Dr. Johney Frame about pt stating her LEE is still present and what her new medication plan should be.   Per Dr. Johney Frame, verbal orders given for [place] to stop the pts amlodipine and her losartan, and start her on Valsartan 320 mg po daily and recheck BMET in one week from start of Valsartan.   Tried calling the pt back to endorse recommendations per Dr. Johney Frame, and she did not answer and her voicemail is full at this time.  Will attempt later.   [name], LPN 07/30/5207  0:22 AM EST     The patient has been notified of the result and verbalized understanding.  All questions (if any) were answered.   Pt states her swelling is mildly improved with lasix 20 mg 3 times weekly. She states Dr. Johney Frame mentioned stopping her amlodipine due to this and increasing her losartan.   Pt aware I will let Dr. Johney Frame know this information and have her advise on a further plan.  She is aware that I will call her back thereafter with further recommendations. Pt verbalized understanding and agrees with this plan.

## 2022-08-02 NOTE — Telephone Encounter (Signed)
Pt aware that we will stop her amlodipine and losartan, and start her on Valsartan 320 mg po daily.  She is aware that she will not have her lab done tomorrow 11/10 at the office, but next Thursday 11/16, for we want to check a bmet at that time to assess how her kidney function and electrolytes are on new start Valsartan.    Pt states she hasn't taken amlodipine or losartan today, so she will go and pick up new medicine Valsartan 320 mg po daily and start taking this tonight and come in for repeat BMET on next Thursday 11/16.  Confirmed the pharmacy of choice with the pt.   Scheduled her for repeat BMET in 7 days on next Thursday 11/16.  Will cancel 11/10 bmet.    Pt verbalized understanding and agrees with this plan.

## 2022-08-02 NOTE — Telephone Encounter (Signed)
-----   Message from [name], LPN sent at 45/09/4602  8:47 AM EST ----- Please review and advise  ----- Message ----- From: Darrell Jewel, RN Sent: 07/31/2022   2:39 PM EST To: [name], LPN   ----- Message ----- From: Freada Bergeron, MD Sent: 07/30/2022   8:27 PM EST To: [facility] Triage  Her ultrasound of her legs shows no clots. This is great news. Is her swelling any better with the lasix?

## 2022-08-03 ENCOUNTER — Other Ambulatory Visit: Payer: Medicare HMO

## 2022-08-08 ENCOUNTER — Ambulatory Visit: Payer: Medicare HMO

## 2022-08-08 DIAGNOSIS — R1313 Dysphagia, pharyngeal phase: Secondary | ICD-10-CM | POA: Diagnosis not present

## 2022-08-08 NOTE — Therapy (Signed)
[facility] OUTPATIENT SPEECH LANGUAGE PATHOLOGY SWALLOWING TREATMENT   Patient Name: Anne Barnes MRN: [id number] DOB:1946-02-21, 76 y.o., female Today's Date: 08/08/2022  PCP: Anne Revels, MD REFERRING PROVIDER: Lavena Bullion, MD:    End of Session - 08/08/22 1535     Visit Number 10    Number of Visits 13    Date for SLP Re-Evaluation 09/07/22    SLP Start Time 1458    SLP Stop Time  1530    SLP Time Calculation (min) 32 min    Activity Tolerance Patient tolerated treatment well                   Past Medical History:  Diagnosis Date   Acute combined systolic and diastolic heart failure ([place]) 05/26/2015   ALLERGIC RHINITIS    Allergic urticaria 08/01/2014   Patient reports allergies to shrimp, egg whites, tomatoes, dairy    Allergy    seasonal and numerous food and drug allergies.   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   Office Spirometry 11/07/17-WNL-FVC 1.58/82%, FEV1 1.27/85%, ratio 0.80, FEF 25-75% 0.26/89%   Bradycardia 02/25/2017   Chiari malformation    Noted MRI brain 09/2013 - s/p neuro eval for same   DIVERTICULOSIS, COLON    Essential hypertension 08/28/2010   Qualifier: Diagnosis of  By: Anne Barnes      Frequent headaches    GERD    H/O measles    H/O mumps    Hearing loss 01/18/2017   History of chicken pox    Hyperglycemia 03/05/2016   HYPERTENSION    Hypocalcemia 02/25/2017   IBS (irritable bowel syndrome) 05/26/2015   Nonspecific abnormal electrocardiogram (ECG) (EKG)    OBSTRUCTIVE SLEEP APNEA 12/2008 dx   noncompliant with CPAP qhs   Obstructive sleep apnea 08/28/2010   NPSG 12/2008:  AHI 13/hr with desats to 78%    Preventative health care 06/11/2017   Sciatica    right side   Seasonal and perennial allergic rhinitis 08/28/2010   Allergy profile 03/11/14- positive especially for dust, cat and dog Food allergy profile- total IgE 86 with several food group elevations Sed rate-WNL      Sinusitis 06/06/2017   Sleep apnea    uses  cpap   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   Vaginitis 01/18/2017   Vitamin D deficiency 06/11/2017   Past Surgical History:  Procedure Laterality Date   ABDOMINAL HYSTERECTOMY  1986   BIOPSY  07/19/2022   Procedure: BIOPSY;  Surgeon: Anne Bullion, DO;  Location: [facility];  Service: Gastroenterology;;   [place]   Breast biopsy   COLONOSCOPY  2011   Dr Collene Mares   ESOPHAGOGASTRODUODENOSCOPY (EGD) WITH PROPOFOL N/A 07/19/2022   Procedure: ESOPHAGOGASTRODUODENOSCOPY (EGD) WITH PROPOFOL;  Surgeon: Anne Bullion, DO;  Location: [facility];  Service: Gastroenterology;  Laterality: N/A;   UMBILICAL HERNIA REPAIR     Patient Active Problem List   Diagnosis Date Noted   Dyspepsia    LUQ pain    Chronic cough    Neck pain on left side 12/13/2021   Congestion of throat 12/13/2021   Osteoporosis 12/13/2021   COVID 04/20/2021   Chest pain of uncertain etiology 35/36/1443   Allergies 12/22/2019   Dizziness 11/19/2019   Grief reaction 08/27/2019   Left shoulder pain 08/27/2019   Hyperlipidemia 07/14/2018   Asthma, mild intermittent 06/06/2018   Morbid obesity ([place]) 01/09/2018   Vitamin D deficiency 06/11/2017   Preventative health care 06/11/2017   Hypocalcemia  02/25/2017   Bradycardia 02/25/2017   Hearing loss 01/18/2017   Hyperglycemia 03/05/2016   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   CHF (congestive heart failure) ([place]) 05/26/2015   Female bladder prolapse 05/26/2015   IBS (irritable bowel syndrome) 05/26/2015   History of chicken pox    Allergic urticaria 08/01/2014   Food allergy 08/01/2014   Tinnitus 03/14/2014   Osteoarthritis    Nonspecific abnormal electrocardiogram (ECG) (EKG)    Headache 10/21/2013   Chiari malformation    GERD 11/10/2010   Obstructive sleep apnea 08/28/2010   Essential hypertension 08/28/2010   Seasonal and perennial allergic rhinitis 08/28/2010   DIVERTICULOSIS, COLON 08/28/2010   Speech Therapy Progress Note  Dates of  Reporting Period: 05/22/22  to present  Subjective Statement: Pt seen for 10 sessions of swallow therapy  Objective: See below  Goal Update: See below.  Plan: See pt for approx 6 more sessions  Reason Skilled Services are Required: Pt has not had f/u MBS yet due to limited progress initially. Pt is beginning to progress with HEP   ONSET DATE: "Two years ago"   REFERRING DIAG: R13.10 (ICD-10-CM) - Dysphagia, unspecified type  THERAPY DIAG:  Dysphagia, pharyngeal phase  Rationale for Evaluation and Treatment Rehabilitation  SUBJECTIVE:   SUBJECTIVE STATEMENT: "Every time I cough it hurts my head." Pt accompanied by: self  PERTINENT HISTORY: Anne Barnes is a 76 y.o. female with PMH: allergic rhinitis, mild asthma, OSA, CHF, nonobstructive CAD on coronary CTA, HTN, obesity, bradycardia, chiari malformation, GERD followed by GI. Per 04/27/22 GI note, patient reporting her reflux symptoms started in late 2022 and included heartburn, intermittent chest pain lasting up to a few hours, increased belching, can have associated dyspepsia. She was started on Prilosec 40 mg for four weeks with plan to titrate to lowes effective dose; EGD recommended but has not been completed.   PAIN:  Are you having pain? No   PATIENT GOALS  Regain normal swallow function  OBJECTIVE:   DIAGNOSTIC FINDINGS:  MRI HEAD WITHOUT CONTRAST  01/28/20 TECHNIQUE: Multiplanar, multiecho pulse sequences of the brain and surrounding structures were obtained without intravenous contrast. COMPARISON:  Head CT November 24, 2019. FINDINGS: Brain: No acute infarction, hemorrhage, hydrocephalus, extra-axial collection or mass lesion. Few scattered foci of T2 hyperintensity are seen within the white matter of the cerebral hemispheres, nonspecific. Prominence of the cerebral sulci in the bilateral parietal regions and prominence of the supratentorial ventricles which has progressed since prior MRI performed in September 30, 2013, but is stable from prior CT. 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 Vascular: Normal flow voids. Skull and upper cervical spine: Normal marrow signal. Sinuses/Orbits: Mild mucosal thickening of the ethmoid cells. The orbits are maintained. IMPRESSION: 1. No acute intracranial abnormality. 2. Prominence of the cerebral sulci in the bilateral parietal regions and prominence of the supratentorial ventricles which has progressed since prior MRI performed in September 30, 2013, but is stable from prior CT performed in September 30, 2013. 3. Chiari 1 malformation.  RESULTS FROM OBJECTIVE SWALLOW STUDY (MBSS/FEES):   Paisyn Guercio presents with a normal oral phase of swallow but a moderately impaired pharyngeal phase of swallow. During pharyngeal phase she exhibited swallow initiation delay with thin liquids and at times with nectar thick liquids. She exhibited consistent aspiration during the swallow with cup sips of thin liquids and straw sips of nectar thick liquids. Aspiration occured without patient sensing until after aspirate had transited past vocal cords. She did then sense aspiration  with a cough but this did not aid in expelling aspirate. When liquids were transiting, portion of liquid bolus moves under epiglottis before it comes into full contact to protect airway. She did not exhibit any aspiration when taking small sips of nectar thick liquids. No significant difficulties with puree, regular, 73m barium tablet or honey thick liquids. Aspiration appears to be impacted by reduced epiglottic deflection as pharyngeal space is more narrow (?pharyngeal edema) and cannot r/o impact from appearance of prominent cricopharyngeal bar.  When liquids transiting, portion of liquid bolus moves under epiglottis before it comes into full contact to protect airway. SLP is recommending nectar thick liquids and continue with solids as she has been. Ms SShillwas given a sample box of  Simply Thick food thickener (nectar consistency). SLP recommending OP SLP for dysphagia and patient's PCP contacted via secure EPIC message.     PATIENT REPORTED OUTCOME MEASURES (PROM): EAT-10: completed with pt. Score was 12. "4" reported with #2 (ability to go out for meals), and #3 (swallowing liquids takes extra effort); "2" reported with #9 (coughing when I eat/drink) and #10 (swallowing is stressful).    TODAY'S TREATMENT:  08/08/22: Pt arrives 13 minutes late today due to son bringing MJame Pt is wearing adequately fitting dentures now. She performed HEP with modified independence. Intermittent difficulty with holding breath due to respiratory challenges today. Pt took inhaler in first 15 minutes of session. She is agreeable to reducing to once/week due to progress, with referral for f/u MBS requested first part of December. Pt was curious about cheaper option for thickener than [employer] - SLP suggested thickener powder from [facility] - looked online and 140 servings for $21.50.  08/01/22: Pt returns today after anxiety attack and medical procedure (EGD). Recertification today. She completed HEP regularly since last session and actually does more exercises from memory than requiring the sheet to look off of. Pt was mod I with procedure today, and followed precautions independently. If pt has same success next session possible decr in frequency will be considered.  07/04/22: MZettastates that she feels like the exercises are going better when she has her new dentures placed. Today she does not have them placed due to being adjusted.  Pt stated, "I'm thinking that I might need to just make (HEP) a part of my life from now on, and that's ok." Her procedure was WNL for all exercises. Pt stated she was doing 5 of effortful and then waiting 15 minutes and doing 5 more. SLP told pt rest no more than 2 minutes between 5 rep sets. She demo'd understanding. SLP told pt she could add 2-5 more reps to any  exercise that after 10 reps it feels like pt could do more reps.  Pt procedure was excellent today and SLP and pt agreed she could return every 2 weeks, with target follow up MBS date the week of 08-06-22..   06/20/22: Pt completed her HEP with modified independence - SLP provided reassurance she was performing correctly. Pt did 5-7 of each exercise without cues necessary but pt cont'd to look at her HEP sheet for assistance. Pt continues to have nectar-liquids at home. Performed HEP with touching her tongue to water if necessary to have something wet to swallow in order to perform HEP. If pt looks the same next session we will decr to once/week.   06/13/22: Pt reports "some mornings I just wake up very mucousy. Today I am." SLP hears lung/pharyngeal congestion today. MLeafyreports hypersensitivity in  her throat and nose/lungs is evident when this occurs. Coughing with 65% of [place]. "It's not because it's going down the wrong way, it's because of that mucous." Pt performed HEP correctly 95% of the time but cont'd to say, "That wasn't right" for approx 25% of her attempts. SLP cont'd to encourage pt that her procedure was correct 95% of the time, and she corrected herself with SLP A when she was incorrect.   06/08/22: Pt again req'd rare min A for HEP procedure but was modified independent by session end. Often she stated that the rep was not good but it was WNL with slight modification (e.g., air escape on supraglottic). Annis told SLP she was primarily performing [place] and supraglottic and SLP encouraged her to perform all.   06/06/22: Pt req'd min A initially for [facility] but was independent after x6 reps. Otherwise pt was modified independent with HEP. Pt stated, "I know this is helping, my swallowing is different and better." Pt told SLP rationale for nectar liquids correctly.  PATIENT EDUCATION: Education details: see above in "today's treatment" Person educated: Patient Education  method: Explanation, Demonstration, Verbal cues, and Handouts Education comprehension: verbalized understanding, returned demonstration, verbal cues required, and needs further education   ASSESSMENT:  CLINICAL IMPRESSION: Recertification today. LTG added for follow up MBS when clinically indicated. Patient was seen today for swallowing therapy after a MBS on 05/16/22. Pt without notable neurological history, so SLP suspects pharyngeal dysphagia has a deconditioning component. There is also question esophageal component - underwent EGD procedure 07-21-22 and her reflux med changed to Nexium from Omeprazole. Pt continues as highly motivated. Today pt again req'd modified independence/independence. SLP/pt agreed frequency could be back at once/week and could decr to one every other week if progress cont next session.  Pt will need MBS in next cert period - if agreed, please co-sign this plan - thank you.  OBJECTIVE IMPAIRMENTS include dysphagia. These impairments are limiting patient from safety when swallowing. Factors affecting potential to achieve goals and functional outcome are co-morbidities (esophageal component?). Patient will benefit from skilled SLP services to address above impairments and improve overall function.  REHAB POTENTIAL: Good   GOALS: Goals reviewed with patient? Yes  SHORT TERM GOALS: Target date: 06/19/2022    Pt will perform HEP with rare min A in 2 sessions Baseline: 06-06-22 Goal status: Met  2.  Pt will tell SLP 3 overt s/s aspiration PNA with modified independence in two sessions Baseline: 06/06/22, 06-11-22 Goal status: Met  3.  Pt will tell SLP rationale for nectar liquids in two sessions Baseline: 05/30/22 Goal status: Met   LONG TERM GOALS: Target date: 07/24/2022    Pt will perform HEP with modified independence in 4 sessions Baseline: 06-20-22, 08-01-22, 08-08-22 Goal status: Revised  2.  Pt will participate in follow up MBS when clinically  indicated  Baseline:  Goal Status: Ongoing   PLAN: SLP FREQUENCY: x1/week  SLP DURATION: other: 5 weeks  PLANNED INTERVENTIONS: Aspiration precaution training, Pharyngeal strengthening exercises, Diet toleration management , Trials of upgraded texture/liquids, Internal/external aids, and Functional tasks    [facility], [place] 08/08/2022, 3:35 PM

## 2022-08-09 ENCOUNTER — Other Ambulatory Visit: Payer: Medicare HMO

## 2022-08-10 ENCOUNTER — Ambulatory Visit (INDEPENDENT_AMBULATORY_CARE_PROVIDER_SITE_OTHER): Payer: Medicare HMO

## 2022-08-10 ENCOUNTER — Ambulatory Visit: Payer: Medicare HMO | Admitting: Podiatry

## 2022-08-10 DIAGNOSIS — M79675 Pain in left toe(s): Secondary | ICD-10-CM

## 2022-08-10 DIAGNOSIS — B351 Tinea unguium: Secondary | ICD-10-CM

## 2022-08-10 DIAGNOSIS — J309 Allergic rhinitis, unspecified: Secondary | ICD-10-CM | POA: Diagnosis not present

## 2022-08-10 DIAGNOSIS — M79674 Pain in right toe(s): Secondary | ICD-10-CM | POA: Diagnosis not present

## 2022-08-10 NOTE — Progress Notes (Unsigned)
Subjective: Chief Complaint  Patient presents with   Anne Barnes     Pt foot is also very swollen and she would like to know what she could do for the foot.    76 year old female presents the office today with above concerns.  She has chronic swelling to her legs and she wears compression socks.  She feels that there is a band around her right ankle but this is better with compression socks.  Also her nails are thickened elongated she has difficulty trimming them.  No open lesions.  PCP: Mosie Lukes, MD-last seen March 29, 2022 A1c: 5.5 on March 29, 2022  Objective: AAO 3, NAD DP/PT pulses palpable, CRT less than 3 seconds Chronic appearing pitting edema present bilaterally.  There is no erythema or warmth. Protective sensation intact with Derrel Nip monofilament Nails hypertrophic, dystrophic, elongated, brittle, discolored 10. There is tenderness overlying the nails 1-5 bilaterally. There is no surrounding erythema or drainage along the nail sites. No open lesions or pre-ulcerative lesions are identified. No pain with calf compression, swelling, warmth, erythema.  Assessment: Patient presents with symptomatic onychomycosis; and, capsulitis  Plan: -Treatment options including alternatives, risks, complications were discussed -Nails sharply debrided 10 without complication/bleeding. -Continue supportive shoe gear.  -Continue compression, elevation.   -Discussed daily foot inspection. If there are any changes, to call the office immediately.  -Follow-up in 3 months or sooner if any problems are to arise. In the meantime, encouraged to call the office with any questions, concerns, changes symptoms.  Celesta Gentile, DPM

## 2022-08-13 ENCOUNTER — Ambulatory Visit: Payer: Medicare HMO | Attending: Cardiology

## 2022-08-13 DIAGNOSIS — I10 Essential (primary) hypertension: Secondary | ICD-10-CM

## 2022-08-13 DIAGNOSIS — I5032 Chronic diastolic (congestive) heart failure: Secondary | ICD-10-CM

## 2022-08-13 DIAGNOSIS — I2510 Atherosclerotic heart disease of native coronary artery without angina pectoris: Secondary | ICD-10-CM

## 2022-08-13 DIAGNOSIS — R600 Localized edema: Secondary | ICD-10-CM | POA: Diagnosis not present

## 2022-08-13 DIAGNOSIS — Z79899 Other long term (current) drug therapy: Secondary | ICD-10-CM

## 2022-08-14 ENCOUNTER — Other Ambulatory Visit: Payer: Self-pay | Admitting: Allergy & Immunology

## 2022-08-14 DIAGNOSIS — J3089 Other allergic rhinitis: Secondary | ICD-10-CM

## 2022-08-14 LAB — BASIC METABOLIC PANEL
BUN/Creatinine Ratio: 15 (ref 12–28)
BUN: 15 mg/dL (ref 8–27)
CO2: 25 mmol/L (ref 20–29)
Calcium: 9.7 mg/dL (ref 8.7–10.3)
Chloride: 103 mmol/L (ref 96–106)
Creatinine, Ser: 1.01 mg/dL — ABNORMAL HIGH (ref 0.57–1.00)
Glucose: 82 mg/dL (ref 70–99)
Potassium: 4.6 mmol/L (ref 3.5–5.2)
Sodium: 143 mmol/L (ref 134–144)
eGFR: 58 mL/min/{1.73_m2} — ABNORMAL LOW (ref >59)

## 2022-08-15 ENCOUNTER — Ambulatory Visit: Payer: Medicare HMO

## 2022-08-15 DIAGNOSIS — R1313 Dysphagia, pharyngeal phase: Secondary | ICD-10-CM

## 2022-08-15 NOTE — Therapy (Signed)
[facility] OUTPATIENT SPEECH LANGUAGE PATHOLOGY SWALLOWING TREATMENT   Patient Name: Anne Barnes MRN: [id number] DOB:11-13-1945, 76 y.o., female Today's Date: 08/15/2022  PCP: Gwyneth Revels, MD REFERRING PROVIDER: Lavena Bullion, MD:    End of Session - 08/15/22 2022     Visit Number 11    Number of Visits 13    Date for SLP Re-Evaluation 09/07/22    SLP Start Time 1448    SLP Stop Time  1530    SLP Time Calculation (min) 42 min    Activity Tolerance Patient tolerated treatment well                   Past Medical History:  Diagnosis Date   Acute combined systolic and diastolic heart failure ([place]) 05/26/2015   ALLERGIC RHINITIS    Allergic urticaria 08/01/2014   Patient reports allergies to shrimp, egg whites, tomatoes, dairy    Allergy    seasonal and numerous food and drug allergies.   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   Office Spirometry 11/07/17-WNL-FVC 1.58/82%, FEV1 1.27/85%, ratio 0.80, FEF 25-75% 0.26/89%   Bradycardia 02/25/2017   Chiari malformation    Noted MRI brain 09/2013 - s/p neuro eval for same   DIVERTICULOSIS, COLON    Essential hypertension 08/28/2010   Qualifier: Diagnosis of  By: Marca Ancona RMA, Lucy      Frequent headaches    GERD    H/O measles    H/O mumps    Hearing loss 01/18/2017   History of chicken pox    Hyperglycemia 03/05/2016   HYPERTENSION    Hypocalcemia 02/25/2017   IBS (irritable bowel syndrome) 05/26/2015   Nonspecific abnormal electrocardiogram (ECG) (EKG)    OBSTRUCTIVE SLEEP APNEA 12/2008 dx   noncompliant with CPAP qhs   Obstructive sleep apnea 08/28/2010   NPSG 12/2008:  AHI 13/hr with desats to 78%    Preventative health care 06/11/2017   Sciatica    right side   Seasonal and perennial allergic rhinitis 08/28/2010   Allergy profile 03/11/14- positive especially for dust, cat and dog Food allergy profile- total IgE 86 with several food group elevations Sed rate-WNL      Sinusitis 06/06/2017   Sleep apnea    uses  cpap   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   Vaginitis 01/18/2017   Vitamin D deficiency 06/11/2017   Past Surgical History:  Procedure Laterality Date   ABDOMINAL HYSTERECTOMY  1986   BIOPSY  07/19/2022   Procedure: BIOPSY;  Surgeon: Lavena Bullion, DO;  Location: [facility];  Service: Gastroenterology;;   [place]   Breast biopsy   COLONOSCOPY  2011   Dr Collene Mares   ESOPHAGOGASTRODUODENOSCOPY (EGD) WITH PROPOFOL N/A 07/19/2022   Procedure: ESOPHAGOGASTRODUODENOSCOPY (EGD) WITH PROPOFOL;  Surgeon: Lavena Bullion, DO;  Location: [facility];  Service: Gastroenterology;  Laterality: N/A;   UMBILICAL HERNIA REPAIR     Patient Active Problem List   Diagnosis Date Noted   Dyspepsia    LUQ pain    Chronic cough    Neck pain on left side 12/13/2021   Congestion of throat 12/13/2021   Osteoporosis 12/13/2021   COVID 04/20/2021   Chest pain of uncertain etiology 57/84/6962   Allergies 12/22/2019   Dizziness 11/19/2019   Grief reaction 08/27/2019   Left shoulder pain 08/27/2019   Hyperlipidemia 07/14/2018   Asthma, mild intermittent 06/06/2018   Morbid obesity ([place]) 01/09/2018   Vitamin D deficiency 06/11/2017   Preventative health care 06/11/2017   Hypocalcemia  02/25/2017   Bradycardia 02/25/2017   Hearing loss 01/18/2017   Hyperglycemia 03/05/2016   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   CHF (congestive heart failure) ([place]) 05/26/2015   Female bladder prolapse 05/26/2015   IBS (irritable bowel syndrome) 05/26/2015   History of chicken pox    Allergic urticaria 08/01/2014   Food allergy 08/01/2014   Tinnitus 03/14/2014   Osteoarthritis    Nonspecific abnormal electrocardiogram (ECG) (EKG)    Headache 10/21/2013   Chiari malformation    GERD 11/10/2010   Obstructive sleep apnea 08/28/2010   Essential hypertension 08/28/2010   Seasonal and perennial allergic rhinitis 08/28/2010   DIVERTICULOSIS, COLON 08/28/2010   Speech Therapy Progress Note  Dates of  Reporting Period: 05/22/22  to present  Subjective Statement: Pt seen for 10 sessions of swallow therapy  Objective: See below  Goal Update: See below.  Plan: See pt for approx 6 more sessions  Reason Skilled Services are Required: Pt has not had f/u MBS yet due to limited progress initially. Pt is beginning to progress with HEP   ONSET DATE: "Two years ago"   REFERRING DIAG: R13.10 (ICD-10-CM) - Dysphagia, unspecified type  THERAPY DIAG:  No diagnosis found.  Rationale for Evaluation and Treatment Rehabilitation  SUBJECTIVE:   SUBJECTIVE STATEMENT: "Every time I cough it hurts my head." Pt accompanied by: self  PERTINENT HISTORY: [name] is a 76 y.o. female with PMH: allergic rhinitis, mild asthma, OSA, CHF, nonobstructive CAD on coronary CTA, HTN, obesity, bradycardia, chiari malformation, GERD followed by GI. Per 04/27/22 GI note, patient reporting her reflux symptoms started in late 2022 and included heartburn, intermittent chest pain lasting up to a few hours, increased belching, can have associated dyspepsia. She was started on Prilosec 40 mg for four weeks with plan to titrate to lowes effective dose; EGD recommended but has not been completed.   PAIN:  Are you having pain? No   PATIENT GOALS  Regain normal swallow function  OBJECTIVE:   DIAGNOSTIC FINDINGS:  MRI HEAD WITHOUT CONTRAST  01/28/20 TECHNIQUE: Multiplanar, multiecho pulse sequences of the brain and surrounding structures were obtained without intravenous contrast. COMPARISON:  Head CT November 24, 2019. FINDINGS: Brain: No acute infarction, hemorrhage, hydrocephalus, extra-axial collection or mass lesion. Few scattered foci of T2 hyperintensity are seen within the white matter of the cerebral hemispheres, nonspecific. Prominence of the cerebral sulci in the bilateral parietal regions and prominence of the supratentorial ventricles which has progressed since prior MRI performed in September 30, 2013,  but is stable from prior CT. 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 Vascular: Normal flow voids. Skull and upper cervical spine: Normal marrow signal. Sinuses/Orbits: Mild mucosal thickening of the ethmoid cells. The orbits are maintained. IMPRESSION: 1. No acute intracranial abnormality. 2. Prominence of the cerebral sulci in the bilateral parietal regions and prominence of the supratentorial ventricles which has progressed since prior MRI performed in September 30, 2013, but is stable from prior CT performed in September 30, 2013. 3. Chiari 1 malformation.  RESULTS FROM OBJECTIVE SWALLOW STUDY (MBSS/FEES):   Keigan Girten presents with a normal oral phase of swallow but a moderately impaired pharyngeal phase of swallow. During pharyngeal phase she exhibited swallow initiation delay with thin liquids and at times with nectar thick liquids. She exhibited consistent aspiration during the swallow with cup sips of thin liquids and straw sips of nectar thick liquids. Aspiration occured without patient sensing until after aspirate had transited past vocal cords. She did then sense aspiration  with a cough but this did not aid in expelling aspirate. When liquids were transiting, portion of liquid bolus moves under epiglottis before it comes into full contact to protect airway. She did not exhibit any aspiration when taking small sips of nectar thick liquids. No significant difficulties with puree, regular, 28m barium tablet or honey thick liquids. Aspiration appears to be impacted by reduced epiglottic deflection as pharyngeal space is more narrow (?pharyngeal edema) and cannot r/o impact from appearance of prominent cricopharyngeal bar.  When liquids transiting, portion of liquid bolus moves under epiglottis before it comes into full contact to protect airway. SLP is recommending nectar thick liquids and continue with solids as she has been. Ms SDethlefswas given a sample box of Simply  Thick food thickener (nectar consistency). SLP recommending OP SLP for dysphagia and patient's PCP contacted via secure EPIC message.     PATIENT REPORTED OUTCOME MEASURES (PROM): EAT-10: completed with pt. Score was 12. "4" reported with #2 (ability to go out for meals), and #3 (swallowing liquids takes extra effort); "2" reported with #9 (coughing when I eat/drink) and #10 (swallowing is stressful).    TODAY'S TREATMENT:  08/15/22: Pt completed HEP with occasional min A for appropriate breath for supraglottic and occasional min A for Mendelsohn, faded to independent with HEP.   08/08/22: Pt arrives 13 minutes late today due to son bringing MShynice Pt is wearing adequately fitting dentures now. She performed HEP with modified independence. Intermittent difficulty with holding breath due to respiratory challenges today. Pt took inhaler in first 15 minutes of session. She is agreeable to reducing to once/week due to progress, with referral for f/u MBS requested first part of December. Pt was curious about cheaper option for thickener than [employer] - SLP suggested thickener powder from [facility] - looked online and 140 servings for $21.50.  08/01/22: Pt returns today after anxiety attack and medical procedure (EGD). Recertification today. She completed HEP regularly since last session and actually does more exercises from memory than requiring the sheet to look off of. Pt was mod I with procedure today, and followed precautions independently. If pt has same success next session possible decr in frequency will be considered.  07/04/22: MCarmencitastates that she feels like the exercises are going better when she has her new dentures placed. Today she does not have them placed due to being adjusted.  Pt stated, "I'm thinking that I might need to just make (HEP) a part of my life from now on, and that's ok." Her procedure was WNL for all exercises. Pt stated she was doing 5 of effortful and then waiting 15 minutes  and doing 5 more. SLP told pt rest no more than 2 minutes between 5 rep sets. She demo'd understanding. SLP told pt she could add 2-5 more reps to any exercise that after 10 reps it feels like pt could do more reps.  Pt procedure was excellent today and SLP and pt agreed she could return every 2 weeks, with target follow up MBS date the week of 08-06-22..   06/20/22: Pt completed her HEP with modified independence - SLP provided reassurance she was performing correctly. Pt did 5-7 of each exercise without cues necessary but pt cont'd to look at her HEP sheet for assistance. Pt continues to have nectar-liquids at home. Performed HEP with touching her tongue to water if necessary to have something wet to swallow in order to perform HEP. If pt looks the same next session we will decr to  once/week.   06/13/22: Pt reports "some mornings I just wake up very mucousy. Today I am." SLP hears lung/pharyngeal congestion today. Shakeira reports hypersensitivity in her throat and nose/lungs is evident when this occurs. Coughing with 65% of Mendelsohn. "It's not because it's going down the wrong way, it's because of that mucous." Pt performed HEP correctly 95% of the time but cont'd to say, "That wasn't right" for approx 25% of her attempts. SLP cont'd to encourage pt that her procedure was correct 95% of the time, and she corrected herself with SLP A when she was incorrect.   06/08/22: Pt again req'd rare min A for HEP procedure but was modified independent by session end. Often she stated that the rep was not good but it was WNL with slight modification (e.g., air escape on supraglottic). Madeeha told SLP she was primarily performing [place] and supraglottic and SLP encouraged her to perform all.   06/06/22: Pt req'd min A initially for [facility] but was independent after x6 reps. Otherwise pt was modified independent with HEP. Pt stated, "I know this is helping, my swallowing is different and better." Pt told SLP  rationale for nectar liquids correctly.  PATIENT EDUCATION: Education details: see above in "today's treatment" Person educated: Patient Education method: Explanation, Demonstration, Verbal cues, and Handouts Education comprehension: verbalized understanding, returned demonstration, verbal cues required, and needs further education   ASSESSMENT:  CLINICAL IMPRESSION: LTG added for follow up MBS when clinically indicated. Patient was seen today for swallowing therapy after a MBS on 05/16/22. Pt without notable neurological history, so SLP suspects pharyngeal dysphagia has a deconditioning component. There is also question esophageal component - underwent EGD procedure 07-21-22 and her reflux med changed to Nexium from Omeprazole. Pt continues as highly motivated. Pt req'd more cueing than previous session. Pt will need MBS in next cert period - if agreed, please co-sign this plan - thank you.  OBJECTIVE IMPAIRMENTS include dysphagia. These impairments are limiting patient from safety when swallowing. Factors affecting potential to achieve goals and functional outcome are co-morbidities (esophageal component?). Patient will benefit from skilled SLP services to address above impairments and improve overall function.  REHAB POTENTIAL: Good   GOALS: Goals reviewed with patient? Yes  SHORT TERM GOALS: Target date: 06/19/2022    Pt will perform HEP with rare min A in 2 sessions Baseline: 06-06-22 Goal status: Met  2.  Pt will tell SLP 3 overt s/s aspiration PNA with modified independence in two sessions Baseline: 06/06/22, 06-11-22 Goal status: Met  3.  Pt will tell SLP rationale for nectar liquids in two sessions Baseline: 05/30/22 Goal status: Met   LONG TERM GOALS: Target date: 07/24/2022    Pt will perform HEP with modified independence in 4 sessions Baseline: 06-20-22, 08-01-22, 08-08-22 Goal status: Revised  2.  Pt will participate in follow up MBS when clinically  indicated  Baseline:  Goal Status: Ongoing   PLAN: SLP FREQUENCY: x1/week  SLP DURATION: other: 5 weeks  PLANNED INTERVENTIONS: Aspiration precaution training, Pharyngeal strengthening exercises, Diet toleration management , Trials of upgraded texture/liquids, Internal/external aids, and Functional tasks    [facility], [place] 08/15/2022, 8:24 PM

## 2022-08-22 ENCOUNTER — Ambulatory Visit: Payer: Medicare HMO | Admitting: Allergy

## 2022-08-23 NOTE — Progress Notes (Deleted)
HPI female never smoker followed for rhinitis/allergies, asthma, OSA, CHF, HTN, GERD, IBS, Peanut and cat cause coughing and we advised her to avoid them Allergy profile 03/11/14- positive especially for dust, cat and dog Food allergy profile- total IgE 86 with several food group elevations Sed rate-WNL Office Spirometry 11/07/17-WNL-FVC 1.58/82%, FEV1 1.27/85%, ratio 0.80, FEF 25-75% 0.26/89% HST 12/05/2017-AHI 12.3/hour, desaturation to 82%, body weight 226 pounds ----------------------------------------------------------------------------------------    08/24/21- 76 year old female never smoker followed for  OSA,  Complicated by CHF, HTN, GERD, IBS, Chiari Malformation,  Rhinitis/Allergies, Asthma, CPAP auto 5-15/ [employer]/ [employer]                 [facility] [place] Download- compliance 93%, AHI 4/ hr Body weight today- -----Albuterol hfa, singulair,  Covid vax- 3 Phizer Flu vax-had She had COVID infection in July with increased wheeze and shortness of breath since then.  Continues to be followed by her allergist.  She is very reluctant to use any medication she can avoid as a matter of principal but does have an albuterol rescue inhaler.  08/24/22-  76 year old female never smoker followed for  OSA,  Complicated by CHF, HTN, GERD/ Dysphagia/Aspiration Risk, IBS, Chiari Malformation,  Rhinitis/Allergies, Asthma, CPAP auto 5-15/ [employer]/ [employer]                 [facility] [place] Download- compliance 93%, AHI 4/ hr Body weight today- -----Albuterol hfa, singulair,  Covid vax- 3 Phizer Flu vax- Has been seeing SLP for heelp with reflux and belching. Followed by GI.  ROS-see HPI  + = positive Constitutional:   No-   weight loss, night sweats, fevers, chills, fatigue, lassitude. HEENT:   +headaches, difficulty swallowing, tooth/dental problems, sore throat,       No-  sneezing, itching, ear ache, nasal congestion, +post nasal drip,  CV:  No-   chest  pain, orthopnea, PND, swelling in lower extremities, anasarca,  +dizziness, palpitations Resp: +shortness of breath with exertion or at rest.              productive cough,  No non-productive cough,  No- coughing up of blood.              No-   change in color of mucus.  No- wheezing.   Skin: No-   rash or lesions. GI:  No-   heartburn, indigestion, abdominal pain, nausea, vomiting,  GU: . MS:  No-   joint pain or swelling.   Neuro-     nothing unusual Psych:  No- change in mood or affect. No depression or anxiety.  No memory loss.  OBJ- Physical Exam General- Alert, Oriented, Affect-appropriate, Distress- none acute, + obese Skin- rash-none, lesions- none, excoriation- none Lymphadenopathy- none Head- atraumatic            Eyes- Gross vision intact, PERRLA, conjunctivae and secretions clear            Ears- Hearing, canals-normal            Nose- + turbinate edema, no-Septal dev, mucus, polyps, erosion, perforation             Throat- Mallampati III-IV , mucosa clear , drainage- none, tonsils- atrophic, +Dentures Neck- flexible , trachea midline, no stridor , thyroid nl, carotid no bruit Chest - symmetrical excursion , unlabored           Heart/CV- RRR , no murmur , no gallop  , no rub, nl s1  s2                           - JVD- none , edema- none, stasis changes- none, varices- none           Lung- wheeze+unlabored, cough none, dullness-none, rub- none           Chest wall-  Abd- Br/ Gen/ Rectal- Not done, not indicated Extrem- cyanosis- none, clubbing, none, atrophy- none, strength- nl Neuro- grossly intact to observation

## 2022-08-24 ENCOUNTER — Ambulatory Visit: Payer: Medicare HMO | Admitting: Internal Medicine

## 2022-08-28 ENCOUNTER — Other Ambulatory Visit (HOSPITAL_COMMUNITY): Payer: Self-pay

## 2022-08-28 ENCOUNTER — Ambulatory Visit: Payer: Medicare HMO | Attending: Family Medicine

## 2022-08-28 DIAGNOSIS — R1313 Dysphagia, pharyngeal phase: Secondary | ICD-10-CM | POA: Insufficient documentation

## 2022-08-28 DIAGNOSIS — R1310 Dysphagia, unspecified: Secondary | ICD-10-CM

## 2022-08-28 NOTE — Therapy (Signed)
Anne Barnes OUTPATIENT SPEECH LANGUAGE PATHOLOGY SWALLOWING TREATMENT   Patient Name: Anne Barnes MRN: [id number] DOB:31-Oct-1945, 76 y.o., female Today's Date: 08/28/2022  PCP: Anne Revels, MD REFERRING PROVIDER: Lavena Bullion, MD:    End of Session - 08/28/22 1521     Visit Number 12    Number of Visits 13    Date for SLP Re-Evaluation 09/07/22    SLP Start Time 87    SLP Stop Time  1528    SLP Time Calculation (min) 38 min    Activity Tolerance Patient tolerated treatment well                    Past Medical History:  Diagnosis Date   Acute combined systolic and diastolic heart failure ([place]) 05/26/2015   ALLERGIC RHINITIS    Allergic urticaria 08/01/2014   Patient reports allergies to shrimp, egg whites, tomatoes, dairy    Allergy    seasonal and numerous food and drug allergies.   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   Office Spirometry 11/07/17-WNL-FVC 1.58/82%, FEV1 1.27/85%, ratio 0.80, FEF 25-75% 0.26/89%   Bradycardia 02/25/2017   Chiari malformation    Noted MRI brain 09/2013 - s/p neuro eval for same   DIVERTICULOSIS, COLON    Essential hypertension 08/28/2010   Qualifier: Diagnosis of  By: Marca Ancona RMA, Lucy      Frequent headaches    GERD    H/O measles    H/O mumps    Hearing loss 01/18/2017   History of chicken pox    Hyperglycemia 03/05/2016   HYPERTENSION    Hypocalcemia 02/25/2017   IBS (irritable bowel syndrome) 05/26/2015   Nonspecific abnormal electrocardiogram (ECG) (EKG)    OBSTRUCTIVE SLEEP APNEA 12/2008 dx   noncompliant with CPAP qhs   Obstructive sleep apnea 08/28/2010   NPSG 12/2008:  AHI 13/hr with desats to 78%    Preventative health care 06/11/2017   Sciatica    right side   Seasonal and perennial allergic rhinitis 08/28/2010   Allergy profile 03/11/14- positive especially for dust, cat and dog Food allergy profile- total IgE 86 with several food group elevations Sed rate-WNL      Sinusitis 06/06/2017   Sleep apnea    uses  cpap   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   Vaginitis 01/18/2017   Vitamin D deficiency 06/11/2017   Past Surgical History:  Procedure Laterality Date   ABDOMINAL HYSTERECTOMY  1986   BIOPSY  07/19/2022   Procedure: BIOPSY;  Surgeon: Anne Bullion, DO;  Location: [facility];  Service: Gastroenterology;;   [place]   Breast biopsy   COLONOSCOPY  2011   Dr Collene Mares   ESOPHAGOGASTRODUODENOSCOPY (EGD) WITH PROPOFOL N/A 07/19/2022   Procedure: ESOPHAGOGASTRODUODENOSCOPY (EGD) WITH PROPOFOL;  Surgeon: Anne Bullion, DO;  Location: [facility];  Service: Gastroenterology;  Laterality: N/A;   UMBILICAL HERNIA REPAIR     Patient Active Problem List   Diagnosis Date Noted   Dyspepsia    LUQ pain    Chronic cough    Neck pain on left side 12/13/2021   Congestion of throat 12/13/2021   Osteoporosis 12/13/2021   COVID 04/20/2021   Chest pain of uncertain etiology 57/97/2820   Allergies 12/22/2019   Dizziness 11/19/2019   Grief reaction 08/27/2019   Left shoulder pain 08/27/2019   Hyperlipidemia 07/14/2018   Asthma, mild intermittent 06/06/2018   Morbid obesity ([place]) 01/09/2018   Vitamin D deficiency 06/11/2017   Preventative health care 06/11/2017  Hypocalcemia 02/25/2017   Bradycardia 02/25/2017   Hearing loss 01/18/2017   Hyperglycemia 03/05/2016   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   CHF (congestive heart failure) ([place]) 05/26/2015   Female bladder prolapse 05/26/2015   IBS (irritable bowel syndrome) 05/26/2015   History of chicken pox    Allergic urticaria 08/01/2014   Food allergy 08/01/2014   Tinnitus 03/14/2014   Osteoarthritis    Nonspecific abnormal electrocardiogram (ECG) (EKG)    Headache 10/21/2013   Chiari malformation    GERD 11/10/2010   Obstructive sleep apnea 08/28/2010   Essential hypertension 08/28/2010   Seasonal and perennial allergic rhinitis 08/28/2010   DIVERTICULOSIS, COLON 08/28/2010   ONSET DATE: "Two years ago"   REFERRING  DIAG: R13.10 (ICD-10-CM) - Dysphagia, unspecified type  THERAPY DIAG:  Dysphagia, pharyngeal phase  Rationale for Evaluation and Treatment Rehabilitation  SUBJECTIVE:   SUBJECTIVE STATEMENT: "I had that stomach bug - I was down." Pt accompanied by: self  PERTINENT HISTORY: Anne Barnes is a 76 y.o. female with PMH: allergic rhinitis, mild asthma, OSA, CHF, nonobstructive CAD on coronary CTA, HTN, obesity, bradycardia, chiari malformation, GERD followed by GI. Per 04/27/22 GI note, patient reporting her reflux symptoms started in late 2022 and included heartburn, intermittent chest pain lasting up to a few hours, increased belching, can have associated dyspepsia. She was started on Prilosec 40 mg for four weeks with plan to titrate to lowes effective dose; EGD recommended but has not been completed.   PAIN:  Are you having pain? No   PATIENT GOALS  Regain normal swallow function  OBJECTIVE:   DIAGNOSTIC FINDINGS:  MRI HEAD WITHOUT CONTRAST  01/28/20 TECHNIQUE: Multiplanar, multiecho pulse sequences of the brain and surrounding structures were obtained without intravenous contrast. COMPARISON:  Head CT November 24, 2019. FINDINGS: Brain: No acute infarction, hemorrhage, hydrocephalus, extra-axial collection or mass lesion. Few scattered foci of T2 hyperintensity are seen within the white matter of the cerebral hemispheres, nonspecific. Prominence of the cerebral sulci in the bilateral parietal regions and prominence of the supratentorial ventricles which has progressed since prior MRI performed in September 30, 2013, but is stable from prior CT. 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 Vascular: Normal flow voids. Skull and upper cervical spine: Normal marrow signal. Sinuses/Orbits: Mild mucosal thickening of the ethmoid cells. The orbits are maintained. IMPRESSION: 1. No acute intracranial abnormality. 2. Prominence of the cerebral sulci in the bilateral  parietal regions and prominence of the supratentorial ventricles which has progressed since prior MRI performed in September 30, 2013, but is stable from prior CT performed in September 30, 2013. 3. Chiari 1 malformation.  RESULTS FROM OBJECTIVE SWALLOW STUDY (MBSS/FEES):   Ivalee Strauser presents with a normal oral phase of swallow but a moderately impaired pharyngeal phase of swallow. During pharyngeal phase she exhibited swallow initiation delay with thin liquids and at times with nectar thick liquids. She exhibited consistent aspiration during the swallow with cup sips of thin liquids and straw sips of nectar thick liquids. Aspiration occured without patient sensing until after aspirate had transited past vocal cords. She did then sense aspiration with a cough but this did not aid in expelling aspirate. When liquids were transiting, portion of liquid bolus moves under epiglottis before it comes into full contact to protect airway. She did not exhibit any aspiration when taking small sips of nectar thick liquids. No significant difficulties with puree, regular, 59m barium tablet or honey thick liquids. Aspiration appears to be impacted by reduced epiglottic deflection  as pharyngeal space is more narrow (?pharyngeal edema) and cannot r/o impact from appearance of prominent cricopharyngeal bar.  When liquids transiting, portion of liquid bolus moves under epiglottis before it comes into full contact to protect airway. SLP is recommending nectar thick liquids and continue with solids as she has been. Ms Ricklefs was given a sample box of Simply Thick food thickener (nectar consistency). SLP recommending OP SLP for dysphagia and patient's PCP contacted via secure EPIC message.     PATIENT REPORTED OUTCOME MEASURES (PROM): EAT-10: completed with pt. Score was 12. "4" reported with #2 (ability to go out for meals), and #3 (swallowing liquids takes extra effort); "2" reported with #9 (coughing when I eat/drink) and #10  (swallowing is stressful).    TODAY'S TREATMENT:  08/28/22: Pt req'd SBA with HEP today. She expressed skepticism at her performance but pt cont assuring Katilin she was performing HEP to SLP satisfaction. SLP sent order through referring MD for follow up MBS today.   08/15/22: Pt completed HEP with occasional min A for appropriate breath for supraglottic and occasional min A for Mendelsohn, faded to independent with HEP.   08/08/22: Pt arrives 13 minutes late today due to son bringing Nyjah. Pt is wearing adequately fitting dentures now. She performed HEP with modified independence. Intermittent difficulty with holding breath due to respiratory challenges today. Pt took inhaler in first 15 minutes of session. She is agreeable to reducing to once/week due to progress, with referral for f/u MBS requested first part of December. Pt was curious about cheaper option for thickener than [employer] - SLP suggested thickener powder from [facility] - looked online and 140 servings for $21.50.  08/01/22: Pt returns today after anxiety attack and medical procedure (EGD). Recertification today. She completed HEP regularly since last session and actually does more exercises from memory than requiring the sheet to look off of. Pt was mod I with procedure today, and followed precautions independently. If pt has same success next session possible decr in frequency will be considered.  07/04/22: Keena states that she feels like the exercises are going better when she has her new dentures placed. Today she does not have them placed due to being adjusted.  Pt stated, "I'm thinking that I might need to just make (HEP) a part of my life from now on, and that's ok." Her procedure was WNL for all exercises. Pt stated she was doing 5 of effortful and then waiting 15 minutes and doing 5 more. SLP told pt rest no more than 2 minutes between 5 rep sets. She demo'd understanding. SLP told pt she could add 2-5 more reps to any exercise that  after 10 reps it feels like pt could do more reps.  Pt procedure was excellent today and SLP and pt agreed she could return every 2 weeks, with target follow up MBS date the week of 08-06-22..   06/20/22: Pt completed her HEP with modified independence - SLP provided reassurance she was performing correctly. Pt did 5-7 of each exercise without cues necessary but pt cont'd to look at her HEP sheet for assistance. Pt continues to have nectar-liquids at home. Performed HEP with touching her tongue to water if necessary to have something wet to swallow in order to perform HEP. If pt looks the same next session we will decr to once/week.   06/13/22: Pt reports "some mornings I just wake up very mucousy. Today I am." SLP hears lung/pharyngeal congestion today. Bexleigh reports hypersensitivity in her throat and  nose/lungs is evident when this occurs. Coughing with 65% of Mendelsohn. "It's not because it's going down the wrong way, it's because of that mucous." Pt performed HEP correctly 95% of the time but cont'd to say, "That wasn't right" for approx 25% of her attempts. SLP cont'd to encourage pt that her procedure was correct 95% of the time, and she corrected herself with SLP A when she was incorrect.   06/08/22: Pt again req'd rare min A for HEP procedure but was modified independent by session end. Often she stated that the rep was not good but it was WNL with slight modification (e.g., air escape on supraglottic). Takaya told SLP she was primarily performing [place] and supraglottic and SLP encouraged her to perform all.   06/06/22: Pt req'd min A initially for [facility] but was independent after x6 reps. Otherwise pt was modified independent with HEP. Pt stated, "I know this is helping, my swallowing is different and better." Pt told SLP rationale for nectar liquids correctly.  PATIENT EDUCATION: Education details: see above in "today's treatment" Person educated: Patient Education method:  Explanation, Demonstration, Verbal cues, and Handouts Education comprehension: verbalized understanding, returned demonstration, verbal cues required, and needs further education   ASSESSMENT:  CLINICAL IMPRESSION: SLP sent order for referring MD to sign for follow up MBS today. Patient was seen today for swallowing therapy after a MBS on 05/16/22. Pt without notable neurological history, so SLP suspects pharyngeal dysphagia has a deconditioning component. There is also question esophageal component - underwent EGD procedure 07-21-22 and her reflux med changed to Nexium from Omeprazole. Pt continues as highly motivated. Pt req'd less cueing than previous session.   OBJECTIVE IMPAIRMENTS include dysphagia. These impairments are limiting patient from safety when swallowing. Factors affecting potential to achieve goals and functional outcome are co-morbidities (esophageal component?). Patient will benefit from skilled SLP services to address above impairments and improve overall function.  REHAB POTENTIAL: Good   GOALS: Goals reviewed with patient? Yes  SHORT TERM GOALS: Target date: 06/19/2022    Pt will perform HEP with rare min A in 2 sessions Baseline: 06-06-22 Goal status: Met  2.  Pt will tell SLP 3 overt s/s aspiration PNA with modified independence in two sessions Baseline: 06/06/22, 06-11-22 Goal status: Met  3.  Pt will tell SLP rationale for nectar liquids in two sessions Baseline: 05/30/22 Goal status: Met   LONG TERM GOALS: Target date: 07/24/2022    Pt will perform HEP with modified independence in 4 sessions Baseline: 06-20-22, 08-01-22, 08-08-22 Goal status: Met  2.  Pt will participate in follow up MBS when clinically indicated  Baseline:  Goal Status: Ongoing   PLAN: SLP FREQUENCY: x1/week  SLP DURATION: other: 5 weeks  PLANNED INTERVENTIONS: Aspiration precaution training, Pharyngeal strengthening exercises, Diet toleration management , Trials of upgraded  texture/liquids, Internal/external aids, and Functional tasks    [facility], [place] 08/28/2022, 3:22 PM

## 2022-09-04 ENCOUNTER — Other Ambulatory Visit: Payer: Self-pay | Admitting: Allergy

## 2022-09-04 ENCOUNTER — Ambulatory Visit: Payer: Medicare HMO

## 2022-09-05 ENCOUNTER — Ambulatory Visit (HOSPITAL_COMMUNITY)
Admission: RE | Admit: 2022-09-05 | Discharge: 2022-09-05 | Disposition: A | Discharge status: Home / Self Care | Payer: Medicare HMO | Source: Ambulatory Visit | Attending: Family Medicine | Admitting: Family Medicine

## 2022-09-05 DIAGNOSIS — R1310 Dysphagia, unspecified: Secondary | ICD-10-CM | POA: Diagnosis not present

## 2022-09-05 DIAGNOSIS — R6339 Other feeding difficulties: Secondary | ICD-10-CM | POA: Diagnosis not present

## 2022-09-05 DIAGNOSIS — R1313 Dysphagia, pharyngeal phase: Secondary | ICD-10-CM | POA: Insufficient documentation

## 2022-09-05 NOTE — Progress Notes (Signed)
Modified Barium Swallow Progress Note  Patient Details  Name: Anne Barnes MRN: [id number] Date of Birth: September 13, 1946  Today's Date: 09/05/2022  Modified Barium Swallow completed.  Full report located under Chart Review in the Imaging Section.  Brief recommendations include the following:  Clinical Impression  Pt demonstrates ongoing aspiration of thin liquids just before/during the swallow due to slightly late/incomplete airway protection. Pt was able to utilize a supraglottic swallow with verbal and visual cues to achieve early, firm airway closure and reduce frequency/severity of aspiration to once in 8 attempts. An intermittent preventative cough/throat clear ejects that trace penetration that remained. Pts cough strength and awareness of swallowing mechanism was much improved after OP SLP treatment. Recommend pt resume thin liquids utilising supraglottic swallow. Emphasized benefits of oral hygiened and drinking water primarily. Pt verbalized understanding. SLP to f/u at OP to reinforce plan.   Swallow Evaluation Recommendations       SLP Diet Recommendations: Regular solids;Thin liquid   Liquid Administration via: Cup;No straw   Medication Administration: Whole meds with puree   Supervision: Patient able to self feed   Compensations: Slow rate;Small sips/bites;Hard cough after swallow (supraglottic swallow)                Annarose Ouellet, Katherene Ponto 09/05/2022,1:58 PM

## 2022-09-07 ENCOUNTER — Telehealth: Payer: Self-pay

## 2022-09-07 ENCOUNTER — Ambulatory Visit (INDEPENDENT_AMBULATORY_CARE_PROVIDER_SITE_OTHER): Payer: Medicare HMO

## 2022-09-07 DIAGNOSIS — J309 Allergic rhinitis, unspecified: Secondary | ICD-10-CM | POA: Diagnosis not present

## 2022-09-07 NOTE — Telephone Encounter (Signed)
Spoke with pt and gave pt recommendations. Pt verbalized understanding.

## 2022-09-07 NOTE — Telephone Encounter (Signed)
-----   Message from [place], DO sent at 09/07/2022  1:56 PM EST ----- I inadvertently deleted this patient message instead of hitting reply.  Can tell her to try taking FD guard at nighttime for the next 2 weeks and see if that helps out.  Alternatively, can trial OTC peppermint oil at nighttime.

## 2022-09-10 ENCOUNTER — Other Ambulatory Visit: Payer: Self-pay | Admitting: Allergy

## 2022-09-10 ENCOUNTER — Telehealth: Payer: Self-pay

## 2022-09-10 MED ORDER — OLOPATADINE HCL 0.2 % OP SOLN: 1.0000 [drp] | OPHTHALMIC | 0 refills | Status: DC

## 2022-09-10 NOTE — Telephone Encounter (Signed)
Patient called needed refill on Olopatadine eye drops. Patient has an appointment scheduled for 10/05/22. Refill sent into the [facility] with no extra refills.  Anne Barnes [phone]

## 2022-09-11 ENCOUNTER — Ambulatory Visit: Payer: Medicare HMO

## 2022-09-11 DIAGNOSIS — R1313 Dysphagia, pharyngeal phase: Secondary | ICD-10-CM | POA: Diagnosis not present

## 2022-09-11 NOTE — Therapy (Signed)
[facility] OUTPATIENT SPEECH LANGUAGE PATHOLOGY SWALLOWING TREATMENT/recertification   Patient Name: Anne Barnes MRN: [id number] DOB:12/09/1945, 76 y.o., female Today's Date: 09/11/2022  PCP: Gwyneth Revels, MD REFERRING PROVIDER: Lavena Bullion, MD:    End of Session - 09/11/22 2320     Visit Number 12    Number of Visits 20    Date for SLP Re-Evaluation 11/10/22    SLP Start Time 3810    SLP Stop Time  1432    SLP Time Calculation (min) 27 min    Activity Tolerance Patient tolerated treatment well                     Past Medical History:  Diagnosis Date   Acute combined systolic and diastolic heart failure ([place]) 05/26/2015   ALLERGIC RHINITIS    Allergic urticaria 08/01/2014   Patient reports allergies to shrimp, egg whites, tomatoes, dairy    Allergy    seasonal and numerous food and drug allergies.   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   Office Spirometry 11/07/17-WNL-FVC 1.58/82%, FEV1 1.27/85%, ratio 0.80, FEF 25-75% 0.26/89%   Bradycardia 02/25/2017   Chiari malformation    Noted MRI brain 09/2013 - s/p neuro eval for same   DIVERTICULOSIS, COLON    Essential hypertension 08/28/2010   Qualifier: Diagnosis of  By: Marca Ancona RMA, Lucy      Frequent headaches    GERD    H/O measles    H/O mumps    Hearing loss 01/18/2017   History of chicken pox    Hyperglycemia 03/05/2016   HYPERTENSION    Hypocalcemia 02/25/2017   IBS (irritable bowel syndrome) 05/26/2015   Nonspecific abnormal electrocardiogram (ECG) (EKG)    OBSTRUCTIVE SLEEP APNEA 12/2008 dx   noncompliant with CPAP qhs   Obstructive sleep apnea 08/28/2010   NPSG 12/2008:  AHI 13/hr with desats to 78%    Preventative health care 06/11/2017   Sciatica    right side   Seasonal and perennial allergic rhinitis 08/28/2010   Allergy profile 03/11/14- positive especially for dust, cat and dog Food allergy profile- total IgE 86 with several food group elevations Sed rate-WNL      Sinusitis 06/06/2017    Sleep apnea    uses cpap   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   Vaginitis 01/18/2017   Vitamin D deficiency 06/11/2017   Past Surgical History:  Procedure Laterality Date   ABDOMINAL HYSTERECTOMY  1986   BIOPSY  07/19/2022   Procedure: BIOPSY;  Surgeon: Lavena Bullion, DO;  Location: [facility];  Service: Gastroenterology;;   [place]   Breast biopsy   COLONOSCOPY  2011   Dr Collene Mares   ESOPHAGOGASTRODUODENOSCOPY (EGD) WITH PROPOFOL N/A 07/19/2022   Procedure: ESOPHAGOGASTRODUODENOSCOPY (EGD) WITH PROPOFOL;  Surgeon: Lavena Bullion, DO;  Location: [facility];  Service: Gastroenterology;  Laterality: N/A;   UMBILICAL HERNIA REPAIR     Patient Active Problem List   Diagnosis Date Noted   Dyspepsia    LUQ pain    Chronic cough    Neck pain on left side 12/13/2021   Congestion of throat 12/13/2021   Osteoporosis 12/13/2021   COVID 04/20/2021   Chest pain of uncertain etiology 17/51/0258   Allergies 12/22/2019   Dizziness 11/19/2019   Grief reaction 08/27/2019   Left shoulder pain 08/27/2019   Hyperlipidemia 07/14/2018   Asthma, mild intermittent 06/06/2018   Morbid obesity ([place]) 01/09/2018   Vitamin D deficiency 06/11/2017   Preventative health care 06/11/2017  Hypocalcemia 02/25/2017   Bradycardia 02/25/2017   Hearing loss 01/18/2017   Hyperglycemia 03/05/2016   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   CHF (congestive heart failure) ([place]) 05/26/2015   Female bladder prolapse 05/26/2015   IBS (irritable bowel syndrome) 05/26/2015   History of chicken pox    Allergic urticaria 08/01/2014   Food allergy 08/01/2014   Tinnitus 03/14/2014   Osteoarthritis    Nonspecific abnormal electrocardiogram (ECG) (EKG)    Headache 10/21/2013   Chiari malformation    GERD 11/10/2010   Obstructive sleep apnea 08/28/2010   Essential hypertension 08/28/2010   Seasonal and perennial allergic rhinitis 08/28/2010   DIVERTICULOSIS, COLON 08/28/2010   ONSET DATE: "Two  years ago"   REFERRING DIAG: R13.10 (ICD-10-CM) - Dysphagia, unspecified type  THERAPY DIAG:  Dysphagia, pharyngeal phase  Rationale for Evaluation and Treatment Rehabilitation  SUBJECTIVE:   SUBJECTIVE STATEMENT: "I will always do the exercises, [name]." Pt accompanied by: self  PERTINENT HISTORY: Anne Barnes is a 76 y.o. female with PMH: allergic rhinitis, mild asthma, OSA, CHF, nonobstructive CAD on coronary CTA, HTN, obesity, bradycardia, chiari malformation, GERD followed by GI. Per 04/27/22 GI note, patient reporting her reflux symptoms started in late 2022 and included heartburn, intermittent chest pain lasting up to a few hours, increased belching, can have associated dyspepsia. She was started on Prilosec 40 mg for four weeks with plan to titrate to lowes effective dose; EGD recommended but has not been completed.   PAIN:  Are you having pain? No   PATIENT GOALS  Regain normal swallow function  OBJECTIVE:   DIAGNOSTIC FINDINGS:  MRI HEAD WITHOUT CONTRAST  01/28/20 TECHNIQUE: Multiplanar, multiecho pulse sequences of the brain and surrounding structures were obtained without intravenous contrast. COMPARISON:  Head CT November 24, 2019. FINDINGS: Brain: No acute infarction, hemorrhage, hydrocephalus, extra-axial collection or mass lesion. Few scattered foci of T2 hyperintensity are seen within the white matter of the cerebral hemispheres, nonspecific. Prominence of the cerebral sulci in the bilateral parietal regions and prominence of the supratentorial ventricles which has progressed since prior MRI performed in September 30, 2013, but is stable from prior CT. 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 Vascular: Normal flow voids. Skull and upper cervical spine: Normal marrow signal. Sinuses/Orbits: Mild mucosal thickening of the ethmoid cells. The orbits are maintained. IMPRESSION: 1. No acute intracranial abnormality. 2. Prominence of the cerebral  sulci in the bilateral parietal regions and prominence of the supratentorial ventricles which has progressed since prior MRI performed in September 30, 2013, but is stable from prior CT performed in September 30, 2013. 3. Chiari 1 malformation.  RESULTS FROM OBJECTIVE SWALLOW STUDY (MBSS/FEES):   05/16/22: Anne Barnes presents with a normal oral phase of swallow but a moderately impaired pharyngeal phase of swallow. During pharyngeal phase she exhibited swallow initiation delay with thin liquids and at times with nectar thick liquids. She exhibited consistent aspiration during the swallow with cup sips of thin liquids and straw sips of nectar thick liquids. Aspiration occured without patient sensing until after aspirate had transited past vocal cords. She did then sense aspiration with a cough but this did not aid in expelling aspirate. When liquids were transiting, portion of liquid bolus moves under epiglottis before it comes into full contact to protect airway. She did not exhibit any aspiration when taking small sips of nectar thick liquids. No significant difficulties with puree, regular, 84m barium tablet or honey thick liquids. Aspiration appears to be impacted by reduced epiglottic deflection as  pharyngeal space is more narrow (?pharyngeal edema) and cannot r/o impact from appearance of prominent cricopharyngeal bar.  When liquids transiting, portion of liquid bolus moves under epiglottis before it comes into full contact to protect airway. SLP is recommending nectar thick liquids and continue with solids as she has been. Anne Barnes was given a sample box of Simply Thick food thickener (nectar consistency). SLP recommending OP SLP for dysphagia and patient's PCP contacted via secure EPIC message.   MBSS  09-05-22 Pt demonstrates ongoing aspiration of thin liquids just before/during the swallow due to slightly late/incomplete airway protection. Pt was able to utilize a supraglottic swallow with verbal  and visual cues to achieve early, firm airway closure and reduce frequency/severity of aspiration to once in 8 attempts. An intermittent preventative cough/throat clear ejects that trace penetration that remained. Pts cough strength and awareness of swallowing mechanism was much improved after OP SLP treatment. Recommend pt resume thin liquids utilising supraglottic swallow. Emphasized benefits of oral hygiened and drinking water primarily. Pt verbalized understanding. SLP to f/u at OP to reinforce plan.     PATIENT REPORTED OUTCOME MEASURES (PROM): EAT-10: completed with pt. Score was 12. "4" reported with #2 (ability to go out for meals), and #3 (swallowing liquids takes extra effort); "2" reported with #9 (coughing when I eat/drink) and #10 (swallowing is stressful).    TODAY'S TREATMENT:  09/11/22: Pt with MBS on 09-05-22, with improved ability with swallowing. Minimal silent aspiration noted. Results above in "diagnostic findings." SLP reviewed results of this assessment with pt - she told SLP she could drink thin liquids using "the special swallow that I have to cough after." SLP ensured pt could do this correctly and she could- requiring initial min A from SLP but was consistently independent in 4 swallows. Today pt ate cereal bar and drank liquid following precautions WNL. She completed HEP with modified independence. SLP suggested pt cont at once every other week and pt agreed this was appropriate.  08/28/22: Pt req'd SBA with HEP today. She expressed skepticism at her performance but pt cont assuring Anne Barnes she was performing HEP to SLP satisfaction. SLP sent order through referring MD for follow up MBS today.   08/15/22: Pt completed HEP with occasional min A for appropriate breath for supraglottic and occasional min A for Mendelsohn, faded to independent with HEP.   08/08/22: Pt arrives 13 minutes late today due to son bringing Anne Barnes. Pt is wearing adequately fitting dentures now. She  performed HEP with modified independence. Intermittent difficulty with holding breath due to respiratory challenges today. Pt took inhaler in first 15 minutes of session. She is agreeable to reducing to once/week due to progress, with referral for f/u MBS requested first part of December. Pt was curious about cheaper option for thickener than [employer] - SLP suggested thickener powder from [facility] - looked online and 140 servings for $21.50.  08/01/22: Pt returns today after anxiety attack and medical procedure (EGD). Recertification today. She completed HEP regularly since last session and actually does more exercises from memory than requiring the sheet to look off of. Pt was mod I with procedure today, and followed precautions independently. If pt has same success next session possible decr in frequency will be considered.  07/04/22: Anne Barnes states that she feels like the exercises are going better when she has her new dentures placed. Today she does not have them placed due to being adjusted.  Pt stated, "I'm thinking that I might need to just make (HEP) a  part of my life from now on, and that's ok." Her procedure was WNL for all exercises. Pt stated she was doing 5 of effortful and then waiting 15 minutes and doing 5 more. SLP told pt rest no more than 2 minutes between 5 rep sets. She demo'd understanding. SLP told pt she could add 2-5 more reps to any exercise that after 10 reps it feels like pt could do more reps.  Pt procedure was excellent today and SLP and pt agreed she could return every 2 weeks, with target follow up MBS date the week of 08-06-22..   06/20/22: Pt completed her HEP with modified independence - SLP provided reassurance she was performing correctly. Pt did 5-7 of each exercise without cues necessary but pt cont'd to look at her HEP sheet for assistance. Pt continues to have nectar-liquids at home. Performed HEP with touching her tongue to water if necessary to have something wet to swallow  in order to perform HEP. If pt looks the same next session we will decr to once/week.   06/13/22: Pt reports "some mornings I just wake up very mucousy. Today I am." SLP hears lung/pharyngeal congestion today. Anne Barnes reports hypersensitivity in her throat and nose/lungs is evident when this occurs. Coughing with 65% of Mendelsohn. "It's not because it's going down the wrong way, it's because of that mucous." Pt performed HEP correctly 95% of the time but cont'd to say, "That wasn't right" for approx 25% of her attempts. SLP cont'd to encourage pt that her procedure was correct 95% of the time, and she corrected herself with SLP A when she was incorrect.   06/08/22: Pt again req'd rare min A for HEP procedure but was modified independent by session end. Often she stated that the rep was not good but it was WNL with slight modification (e.g., air escape on supraglottic). Anne Barnes told SLP she was primarily performing [place] and supraglottic and SLP encouraged her to perform all.   06/06/22: Pt req'd min A initially for [facility] but was independent after x6 reps. Otherwise pt was modified independent with HEP. Pt stated, "I know this is helping, my swallowing is different and better." Pt told SLP rationale for nectar liquids correctly.  PATIENT EDUCATION: Education details: see above in "today's treatment" Person educated: Patient Education method: Explanation, Demonstration, Verbal cues, and Handouts Education comprehension: verbalized understanding, returned demonstration, verbal cues required, and needs further education   ASSESSMENT:  CLINICAL IMPRESSION: RECERT TODAY. MBS on 09-05-22, results above in "diagnostic findings". Pt now is safe to drink thin liquids with superglottic swallow. Pt without notable neurological history, so SLP suspects pharyngeal dysphagia has a deconditioning component. There is also question esophageal component - underwent EGD procedure 07-21-22 and her reflux med  changed to Nexium from Omeprazole. Pt continues as highly motivated. Pt completed HEP with modified independence.  OBJECTIVE IMPAIRMENTS include dysphagia. These impairments are limiting patient from safety when swallowing. Factors affecting potential to achieve goals and functional outcome are co-morbidities (esophageal component?). Patient will benefit from skilled SLP services to address above impairments and improve overall function.  REHAB POTENTIAL: Good   GOALS: Goals reviewed with patient? Yes  SHORT TERM GOALS: Target date: 06/19/2022    Pt will perform HEP with rare min A in 2 sessions Baseline: 06-06-22 Goal status: Met  2.  Pt will tell SLP 3 overt s/s aspiration PNA with modified independence in two sessions Baseline: 06/06/22, 06-11-22 Goal status: Met  3.  Pt will tell SLP rationale for  nectar liquids in two sessions Baseline: 05/30/22 Goal status: Met   LONG TERM GOALS: Target date: 07/24/2022  New date:11/10/22    Pt will perform HEP with modified independence in 4 sessions Baseline: 06-20-22, 08-01-22, 08-08-22 Goal status: Met  2.  Pt will participate in follow up MBS when clinically indicated  Baseline:  Goal Status: Met  3. Pt will follow HEP with modified independence with therapy at once every other week, x3 visits Baseline: 09-11-22 Goal Status: Initial  PLAN: SLP FREQUENCY: x1/ every other week  SLP DURATION: other: 4 visits  PLANNED INTERVENTIONS: Aspiration precaution training, Pharyngeal strengthening exercises, Diet toleration management , Trials of upgraded texture/liquids, Internal/external aids, and Functional tasks    [facility], [place] 09/11/2022, 11:21 PM

## 2022-09-11 NOTE — Patient Instructions (Signed)
    Holding water in your mouth for 3 seconds and swallow hard 10-15 times  Swallow and squeeze hard for 5 seconds   10-15 times  Take a breath and hold it Sip and cough  10-15 times

## 2022-09-13 ENCOUNTER — Ambulatory Visit (INDEPENDENT_AMBULATORY_CARE_PROVIDER_SITE_OTHER): Payer: Medicare HMO

## 2022-09-13 DIAGNOSIS — J309 Allergic rhinitis, unspecified: Secondary | ICD-10-CM

## 2022-09-20 ENCOUNTER — Ambulatory Visit (INDEPENDENT_AMBULATORY_CARE_PROVIDER_SITE_OTHER): Payer: Medicare HMO

## 2022-09-20 DIAGNOSIS — J309 Allergic rhinitis, unspecified: Secondary | ICD-10-CM

## 2022-09-26 ENCOUNTER — Ambulatory Visit: Payer: Medicare HMO | Attending: Family Medicine

## 2022-09-26 ENCOUNTER — Ambulatory Visit: Payer: Medicare HMO

## 2022-09-26 DIAGNOSIS — R1313 Dysphagia, pharyngeal phase: Secondary | ICD-10-CM | POA: Diagnosis not present

## 2022-09-26 NOTE — Progress Notes (Signed)
HPI female never smoker followed for rhinitis/allergies, asthma, OSA, CHF, HTN, GERD, IBS, Peanut and cat cause coughing and we advised her to avoid them Allergy profile 03/11/14- positive especially for dust, cat and dog Food allergy profile- total IgE 86 with several food group elevations Sed rate-WNL Office Spirometry 11/07/17-WNL-FVC 1.58/82%, FEV1 1.27/85%, ratio 0.80, FEF 25-75% 0.26/89% HST 12/05/2017-AHI 12.3/hour, desaturation to 82%, body weight 226 pounds ----------------------------------------------------------------------------------------    08/24/21- 77 year old female never smoker followed for  OSA,  Complicated by CHF, HTN, GERD, IBS, Chiari Malformation,  Rhinitis/Allergies, Asthma, CPAP auto 5-15/ [employer]/ [employer]                 [facility] [place] Download- compliance 93%, AHI 4/ hr Body weight today- -----Albuterol hfa, singulair,  Covid vax- 3 Phizer Flu vax-had She had COVID infection in July with increased wheeze and shortness of breath since then.  Continues to be followed by her allergist.  She is very reluctant to use any medication she can avoid as a matter of principal but does have an albuterol rescue inhaler.  09/27/22- 77 year old female never smoker followed for  OSA,  Complicated by CHF, HTN, GERD, IBS, Chiari Malformation,  Rhinitis/Allergies, Asthma, CPAP auto 5-12/ [facility]/ [employer]                                          [facility] [place] Download- compliance 93%, AHI 3.4/ hr Body weight today- -----Albuterol hfa, singulair,  Covid vax- 3 Phizer Flu vax-declines Download reviewed.  She says she is comfortable with her CPAP. Asks lower pressure Has seen a speech therapist for LPR Occasional dizziness for which she blames allergy.  She continues to take allergy vaccine from her allergy office. No significant wheezing.  ROS-see HPI  + = positive Constitutional:   No-   weight loss, night sweats, fevers, chills,  fatigue, lassitude. HEENT:   +headaches, difficulty swallowing, tooth/dental problems, sore throat,       No-  sneezing, itching, ear ache, nasal congestion, +post nasal drip,  CV:  No-   chest pain, orthopnea, PND, swelling in lower extremities, anasarca,  +dizziness, palpitations Resp: +shortness of breath with exertion or at rest.              productive cough,  No non-productive cough,  No- coughing up of blood.              No-   change in color of mucus.  No- wheezing.   Skin: No-   rash or lesions. GI:  No-   heartburn, indigestion, abdominal pain, nausea, vomiting,  GU: . MS:  No-   joint pain or swelling.   Neuro-     nothing unusual Psych:  No- change in mood or affect. No depression or anxiety.  No memory loss.  OBJ- Physical Exam General- Alert, Oriented, Affect-appropriate, Distress- none acute, + obese Skin- rash-none, lesions- none, excoriation- none Lymphadenopathy- none Head- atraumatic            Eyes- Gross vision intact, PERRLA, conjunctivae and secretions clear            Ears- Hearing, canals-normal            Nose- + turbinate edema, no-Septal dev, mucus, polyps, erosion, perforation             Throat- Mallampati III-IV ,  mucosa clear , drainage- none, tonsils- atrophic, +Dentures Neck- flexible , trachea midline, no stridor , thyroid nl, carotid no bruit Chest - symmetrical excursion , unlabored           Heart/CV- RRR , no murmur , no gallop  , no rub, nl s1 s2                           - JVD- none , edema- none, stasis changes- none, varices- none           Lung- wheeze-none, cough none, dullness-none, rub- none           Chest wall-  Abd- Br/ Gen/ Rectal- Not done, not indicated Extrem- cyanosis- none, clubbing, none, atrophy- none, strength- nl Neuro- grossly intact to observation

## 2022-09-26 NOTE — Therapy (Signed)
[facility] OUTPATIENT SPEECH LANGUAGE PATHOLOGY SWALLOWING TREATMENT   Patient Name: Anne Barnes MRN: [id number] DOB:1946-01-05, 77 y.o., female Today's Date: 09/26/2022  PCP: Gwyneth Revels, MD REFERRING PROVIDER: Lavena Bullion, MD:    End of Session - 09/26/22 2340     Visit Number 13    Number of Visits 20    Date for SLP Re-Evaluation 11/10/22    SLP Start Time 1535    SLP Stop Time  2130    SLP Time Calculation (min) 29 min    Activity Tolerance Patient tolerated treatment well                      Past Medical History:  Diagnosis Date   Acute combined systolic and diastolic heart failure ([place]) 05/26/2015   ALLERGIC RHINITIS    Allergic urticaria 08/01/2014   Patient reports allergies to shrimp, egg whites, tomatoes, dairy    Allergy    seasonal and numerous food and drug allergies.   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   Office Spirometry 11/07/17-WNL-FVC 1.58/82%, FEV1 1.27/85%, ratio 0.80, FEF 25-75% 0.26/89%   Bradycardia 02/25/2017   Chiari malformation    Noted MRI brain 09/2013 - s/p neuro eval for same   DIVERTICULOSIS, COLON    Essential hypertension 08/28/2010   Qualifier: Diagnosis of  By: Marca Ancona RMA, Lucy      Frequent headaches    GERD    H/O measles    H/O mumps    Hearing loss 01/18/2017   History of chicken pox    Hyperglycemia 03/05/2016   HYPERTENSION    Hypocalcemia 02/25/2017   IBS (irritable bowel syndrome) 05/26/2015   Nonspecific abnormal electrocardiogram (ECG) (EKG)    OBSTRUCTIVE SLEEP APNEA 12/2008 dx   noncompliant with CPAP qhs   Obstructive sleep apnea 08/28/2010   NPSG 12/2008:  AHI 13/hr with desats to 78%    Preventative health care 06/11/2017   Sciatica    right side   Seasonal and perennial allergic rhinitis 08/28/2010   Allergy profile 03/11/14- positive especially for dust, cat and dog Food allergy profile- total IgE 86 with several food group elevations Sed rate-WNL      Sinusitis 06/06/2017   Sleep apnea     uses cpap   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   Vaginitis 01/18/2017   Vitamin D deficiency 06/11/2017   Past Surgical History:  Procedure Laterality Date   ABDOMINAL HYSTERECTOMY  1986   BIOPSY  07/19/2022   Procedure: BIOPSY;  Surgeon: Lavena Bullion, DO;  Location: [facility];  Service: Gastroenterology;;   [place]   Breast biopsy   COLONOSCOPY  2011   Dr Collene Mares   ESOPHAGOGASTRODUODENOSCOPY (EGD) WITH PROPOFOL N/A 07/19/2022   Procedure: ESOPHAGOGASTRODUODENOSCOPY (EGD) WITH PROPOFOL;  Surgeon: Lavena Bullion, DO;  Location: [facility];  Service: Gastroenterology;  Laterality: N/A;   UMBILICAL HERNIA REPAIR     Patient Active Problem List   Diagnosis Date Noted   Dyspepsia    LUQ pain    Chronic cough    Neck pain on left side 12/13/2021   Congestion of throat 12/13/2021   Osteoporosis 12/13/2021   COVID 04/20/2021   Chest pain of uncertain etiology 86/57/8469   Allergies 12/22/2019   Dizziness 11/19/2019   Grief reaction 08/27/2019   Left shoulder pain 08/27/2019   Hyperlipidemia 07/14/2018   Asthma, mild intermittent 06/06/2018   Morbid obesity ([place]) 01/09/2018   Vitamin D deficiency 06/11/2017   Preventative health care 06/11/2017  Hypocalcemia 02/25/2017   Bradycardia 02/25/2017   Hearing loss 01/18/2017   Hyperglycemia 03/05/2016   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   CHF (congestive heart failure) ([place]) 05/26/2015   Female bladder prolapse 05/26/2015   IBS (irritable bowel syndrome) 05/26/2015   History of chicken pox    Allergic urticaria 08/01/2014   Food allergy 08/01/2014   Tinnitus 03/14/2014   Osteoarthritis    Nonspecific abnormal electrocardiogram (ECG) (EKG)    Headache 10/21/2013   Chiari malformation    GERD 11/10/2010   Obstructive sleep apnea 08/28/2010   Essential hypertension 08/28/2010   Seasonal and perennial allergic rhinitis 08/28/2010   DIVERTICULOSIS, COLON 08/28/2010   ONSET DATE: "Two years ago"    REFERRING DIAG: R13.10 (ICD-10-CM) - Dysphagia, unspecified type  THERAPY DIAG:  Dysphagia, pharyngeal phase  Rationale for Evaluation and Treatment Rehabilitation  SUBJECTIVE:   SUBJECTIVE STATEMENT: "It really is becoming a part of me, Glendell Docker. (Re: swallow precautions)" Pt accompanied by: self  PERTINENT HISTORY: Anne Barnes is a 77 y.o. female with PMH: allergic rhinitis, mild asthma, OSA, CHF, nonobstructive CAD on coronary CTA, HTN, obesity, bradycardia, chiari malformation, GERD followed by GI. Per 04/27/22 GI note, patient reporting her reflux symptoms started in late 2022 and included heartburn, intermittent chest pain lasting up to a few hours, increased belching, can have associated dyspepsia. She was started on Prilosec 40 mg for four weeks with plan to titrate to lowes effective dose; EGD recommended but has not been completed.   PAIN:  Are you having pain? No   PATIENT GOALS  Regain normal swallow function  OBJECTIVE:   DIAGNOSTIC FINDINGS:  MRI HEAD WITHOUT CONTRAST  01/28/20 TECHNIQUE: Multiplanar, multiecho pulse sequences of the brain and surrounding structures were obtained without intravenous contrast. COMPARISON:  Head CT November 24, 2019. FINDINGS: Brain: No acute infarction, hemorrhage, hydrocephalus, extra-axial collection or mass lesion. Few scattered foci of T2 hyperintensity are seen within the white matter of the cerebral hemispheres, nonspecific. Prominence of the cerebral sulci in the bilateral parietal regions and prominence of the supratentorial ventricles which has progressed since prior MRI performed in September 30, 2013, but is stable from prior CT. 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 Vascular: Normal flow voids. Skull and upper cervical spine: Normal marrow signal. Sinuses/Orbits: Mild mucosal thickening of the ethmoid cells. The orbits are maintained. IMPRESSION: 1. No acute intracranial abnormality. 2.  Prominence of the cerebral sulci in the bilateral parietal regions and prominence of the supratentorial ventricles which has progressed since prior MRI performed in September 30, 2013, but is stable from prior CT performed in September 30, 2013. 3. Chiari 1 malformation.  RESULTS FROM OBJECTIVE SWALLOW STUDY (MBSS/FEES):   05/16/22: [name] presents with a normal oral phase of swallow but a moderately impaired pharyngeal phase of swallow. During pharyngeal phase she exhibited swallow initiation delay with thin liquids and at times with nectar thick liquids. She exhibited consistent aspiration during the swallow with cup sips of thin liquids and straw sips of nectar thick liquids. Aspiration occured without patient sensing until after aspirate had transited past vocal cords. She did then sense aspiration with a cough but this did not aid in expelling aspirate. When liquids were transiting, portion of liquid bolus moves under epiglottis before it comes into full contact to protect airway. She did not exhibit any aspiration when taking small sips of nectar thick liquids. No significant difficulties with puree, regular, 37m barium tablet or honey thick liquids. Aspiration appears to be impacted  by reduced epiglottic deflection as pharyngeal space is more narrow (?pharyngeal edema) and cannot r/o impact from appearance of prominent cricopharyngeal bar.  When liquids transiting, portion of liquid bolus moves under epiglottis before it comes into full contact to protect airway. SLP is recommending nectar thick liquids and continue with solids as she has been. Ms Lozito was given a sample box of Simply Thick food thickener (nectar consistency). SLP recommending OP SLP for dysphagia and patient's PCP contacted via secure EPIC message.   MBSS  09-05-22 Pt demonstrates ongoing aspiration of thin liquids just before/during the swallow due to slightly late/incomplete airway protection. Pt was able to utilize a  supraglottic swallow with verbal and visual cues to achieve early, firm airway closure and reduce frequency/severity of aspiration to once in 8 attempts. An intermittent preventative cough/throat clear ejects that trace penetration that remained. Pts cough strength and awareness of swallowing mechanism was much improved after OP SLP treatment. Recommend pt resume thin liquids utilising supraglottic swallow. Emphasized benefits of oral hygiened and drinking water primarily. Pt verbalized understanding. SLP to f/u at OP to reinforce plan.     PATIENT REPORTED OUTCOME MEASURES (PROM): EAT-10: completed with pt. Score was 12. "4" reported with #2 (ability to go out for meals), and #3 (swallowing liquids takes extra effort); "2" reported with #9 (coughing when I eat/drink) and #10 (swallowing is stressful).    TODAY'S TREATMENT:  09/26/22: No overt s/sx aspiration PNA observed and pt denies these as well, SLP reiterated to pt that if she does become acutely ill she may need to switch to nectar liquids. She drank water with precautions followed (supraglottic swallow with liquids) with rare min A faded to modified independence.  Performance of HEP was WNL. Pt agrees to cont with once every other week. If pt performance cont next session consider once/4 weeks.  09/11/22: Pt with MBS on 09-05-22, with improved ability with swallowing. Minimal silent aspiration noted. Results above in "diagnostic findings." SLP reviewed results of this assessment with pt - she told SLP she could drink thin liquids using "the special swallow that I have to cough after." SLP ensured pt could do this correctly and she could- requiring initial min A from SLP but was consistently independent in 4 swallows. Today pt ate cereal bar and drank liquid following precautions WNL. She completed HEP with modified independence. SLP suggested pt cont at once every other week and pt agreed this was appropriate.  08/28/22: Pt req'd SBA with HEP  today. She expressed skepticism at her performance but pt cont assuring Jaleya she was performing HEP to SLP satisfaction. SLP sent order through referring MD for follow up MBS today.   08/15/22: Pt completed HEP with occasional min A for appropriate breath for supraglottic and occasional min A for Mendelsohn, faded to independent with HEP.   08/08/22: Pt arrives 13 minutes late today due to son bringing Asmi. Pt is wearing adequately fitting dentures now. She performed HEP with modified independence. Intermittent difficulty with holding breath due to respiratory challenges today. Pt took inhaler in first 15 minutes of session. She is agreeable to reducing to once/week due to progress, with referral for f/u MBS requested first part of December. Pt was curious about cheaper option for thickener than [employer] - SLP suggested thickener powder from [facility] - looked online and 140 servings for $21.50.  08/01/22: Pt returns today after anxiety attack and medical procedure (EGD). Recertification today. She completed HEP regularly since last session and actually does more exercises  from memory than requiring the sheet to look off of. Pt was mod I with procedure today, and followed precautions independently. If pt has same success next session possible decr in frequency will be considered.  07/04/22: Catelin states that she feels like the exercises are going better when she has her new dentures placed. Today she does not have them placed due to being adjusted.  Pt stated, "I'm thinking that I might need to just make (HEP) a part of my life from now on, and that's ok." Her procedure was WNL for all exercises. Pt stated she was doing 5 of effortful and then waiting 15 minutes and doing 5 more. SLP told pt rest no more than 2 minutes between 5 rep sets. She demo'd understanding. SLP told pt she could add 2-5 more reps to any exercise that after 10 reps it feels like pt could do more reps.  Pt procedure was excellent  today and SLP and pt agreed she could return every 2 weeks, with target follow up MBS date the week of 08-06-22..   06/20/22: Pt completed her HEP with modified independence - SLP provided reassurance she was performing correctly. Pt did 5-7 of each exercise without cues necessary but pt cont'd to look at her HEP sheet for assistance. Pt continues to have nectar-liquids at home. Performed HEP with touching her tongue to water if necessary to have something wet to swallow in order to perform HEP. If pt looks the same next session we will decr to once/week.   06/13/22: Pt reports "some mornings I just wake up very mucousy. Today I am." SLP hears lung/pharyngeal congestion today. Sascha reports hypersensitivity in her throat and nose/lungs is evident when this occurs. Coughing with 65% of Mendelsohn. "It's not because it's going down the wrong way, it's because of that mucous." Pt performed HEP correctly 95% of the time but cont'd to say, "That wasn't right" for approx 25% of her attempts. SLP cont'd to encourage pt that her procedure was correct 95% of the time, and she corrected herself with SLP A when she was incorrect.   06/08/22: Pt again req'd rare min A for HEP procedure but was modified independent by session end. Often she stated that the rep was not good but it was WNL with slight modification (e.g., air escape on supraglottic). Cambelle told SLP she was primarily performing [place] and supraglottic and SLP encouraged her to perform all.   06/06/22: Pt req'd min A initially for [facility] but was independent after x6 reps. Otherwise pt was modified independent with HEP. Pt stated, "I know this is helping, my swallowing is different and better." Pt told SLP rationale for nectar liquids correctly.  PATIENT EDUCATION: Education details: see above in "today's treatment" Person educated: Patient Education method: Explanation, Demonstration, Verbal cues, and Handouts Education comprehension: verbalized  understanding, returned demonstration, verbal cues required, and needs further education   ASSESSMENT:  CLINICAL IMPRESSION: RECERT TODAY. MBS on 09-05-22, results above in "diagnostic findings". Pt now is safe to drink thin liquids with superglottic swallow. Pt without notable neurological history, so SLP suspects pharyngeal dysphagia has a deconditioning component. There is also question esophageal component - underwent EGD procedure 07-21-22 and her reflux med changed to Nexium from Omeprazole. Pt continues as highly motivated. Pt completed HEP with modified independence.  OBJECTIVE IMPAIRMENTS include dysphagia. These impairments are limiting patient from safety when swallowing. Factors affecting potential to achieve goals and functional outcome are co-morbidities (esophageal component?). Patient will benefit from skilled SLP  services to address above impairments and improve overall function.  REHAB POTENTIAL: Good   GOALS: Goals reviewed with patient? Yes  SHORT TERM GOALS: Target date: 06/19/2022    Pt will perform HEP with rare min A in 2 sessions Baseline: 06-06-22 Goal status: Met  2.  Pt will tell SLP 3 overt s/s aspiration PNA with modified independence in two sessions Baseline: 06/06/22, 06-11-22 Goal status: Met  3.  Pt will tell SLP rationale for nectar liquids in two sessions Baseline: 05/30/22 Goal status: Met   LONG TERM GOALS: Target date: 07/24/2022  New date:11/10/22    Pt will perform HEP with modified independence in 4 sessions Baseline: 06-20-22, 08-01-22, 08-08-22 Goal status: Met  2.  Pt will participate in follow up MBS when clinically indicated  Baseline:  Goal Status: Met  3. Pt will follow HEP with modified independence with therapy at once every other week, x3 visits Baseline: 09-11-22, 09-26-22 Goal Status: ongoing  PLAN: SLP FREQUENCY: x1/ every other week  SLP DURATION: other: 4 visits  PLANNED INTERVENTIONS: Aspiration precaution training,  Pharyngeal strengthening exercises, Diet toleration management , Trials of upgraded texture/liquids, Internal/external aids, and Functional tasks    [facility], [place] 09/26/2022, 11:40 PM

## 2022-09-27 ENCOUNTER — Encounter: Payer: Self-pay | Admitting: Internal Medicine

## 2022-09-27 ENCOUNTER — Ambulatory Visit: Payer: Medicare HMO | Admitting: Internal Medicine

## 2022-09-27 VITALS — BP 130/88 | HR 80 | Ht 59.5 in | Wt 237.8 lb

## 2022-09-27 DIAGNOSIS — J4521 Mild intermittent asthma with (acute) exacerbation: Secondary | ICD-10-CM

## 2022-09-27 DIAGNOSIS — G4733 Obstructive sleep apnea (adult) (pediatric): Secondary | ICD-10-CM

## 2022-09-27 DIAGNOSIS — J4520 Mild intermittent asthma, uncomplicated: Secondary | ICD-10-CM | POA: Diagnosis not present

## 2022-09-27 NOTE — Patient Instructions (Signed)
Order- DME [facility]- please change CPAP auto range to 4-8  Please call if we can help

## 2022-09-28 ENCOUNTER — Ambulatory Visit (INDEPENDENT_AMBULATORY_CARE_PROVIDER_SITE_OTHER): Payer: Medicare HMO

## 2022-09-28 DIAGNOSIS — J309 Allergic rhinitis, unspecified: Secondary | ICD-10-CM | POA: Diagnosis not present

## 2022-09-30 ENCOUNTER — Other Ambulatory Visit: Payer: Self-pay | Admitting: Gastroenterology

## 2022-10-01 ENCOUNTER — Other Ambulatory Visit: Payer: Self-pay

## 2022-10-01 MED ORDER — ROSUVASTATIN CALCIUM 10 MG PO TABS: 10.0000 mg | ORAL_TABLET | Freq: Every day | ORAL | 3 refills | Status: DC

## 2022-10-05 ENCOUNTER — Encounter: Payer: Self-pay | Admitting: Allergy

## 2022-10-05 ENCOUNTER — Ambulatory Visit: Payer: Self-pay

## 2022-10-05 ENCOUNTER — Other Ambulatory Visit: Payer: Self-pay

## 2022-10-05 ENCOUNTER — Ambulatory Visit: Payer: Medicare HMO | Admitting: Allergy

## 2022-10-05 VITALS — BP 118/70 | HR 74 | Temp 98.1°F | Resp 16 | Ht 59.5 in | Wt 232.9 lb

## 2022-10-05 DIAGNOSIS — J3089 Other allergic rhinitis: Secondary | ICD-10-CM

## 2022-10-05 DIAGNOSIS — Z91018 Allergy to other foods: Secondary | ICD-10-CM

## 2022-10-05 DIAGNOSIS — J309 Allergic rhinitis, unspecified: Secondary | ICD-10-CM | POA: Diagnosis not present

## 2022-10-05 DIAGNOSIS — J4520 Mild intermittent asthma, uncomplicated: Secondary | ICD-10-CM

## 2022-10-05 MED ORDER — MONTELUKAST SODIUM 10 MG PO TABS: 10.0000 mg | ORAL_TABLET | Freq: Every day | ORAL | 5 refills | Status: DC

## 2022-10-05 MED ORDER — AZELASTINE HCL 0.05 % OP SOLN: 1.0000 [drp] | Freq: Two times a day (BID) | OPHTHALMIC | 5 refills | Status: DC | PRN

## 2022-10-05 MED ORDER — EPINEPHRINE 0.3 MG/0.3ML IJ SOAJ: 0.3000 mg | INTRAMUSCULAR | 1 refills | Status: DC | PRN

## 2022-10-05 MED ORDER — VENTOLIN HFA 108 (90 BASE) MCG/ACT IN AERS: 2.0000 | INHALATION_SPRAY | RESPIRATORY_TRACT | 1 refills | Status: DC | PRN

## 2022-10-05 MED ORDER — AZELASTINE HCL 0.1 % NA SOLN: 2.0000 | Freq: Two times a day (BID) | NASAL | 5 refills | Status: DC | PRN

## 2022-10-05 NOTE — Patient Instructions (Addendum)
-  continue avoidance measures for dust mites, cat and dog.   - continue Xyzal '5mg'$  daily - continue Singulair '10mg'$  daily - for nasal drainage and post-nasal drip continue use of nasal antihistamine, Astelin 2 sprays each nostril 2 times a day.   - recommend performing nasal saline rinses prior to use of medicated nasal sprays to help flush out the sinuses.  Use with distilled water at room temperature.  Breathe in and out through your mouth during the entire process. - stop Pataday as not effective.  Start Optivar 1 drop each eye up to twice a day as needed for itchy/watery/red eyes.  - continue allergy injections per schedule. We will discuss retesting for your allergies next visit after you reach monthly dosing with your allergy shots.   - continue food avoidance of shellfish, gluten, soybean, peanut  - skin testing to these foods were negative at initial visit.   - have access to your epinephrine device in case of allergic reaction.   - follow emergency action plan in case of allergic reaction   - have access to albuterol inhaler 2 puffs every 4-6 hours as needed for cough/wheeze/shortness of breath/chest tightness.  May use 15-20 minutes prior to activity.   Monitor frequency of use.    Follow-up 9 months (after October 2024) or sooner if needed

## 2022-10-05 NOTE — Progress Notes (Signed)
Follow-up Note  RE: Anne Barnes MRN: [id number] DOB: 12-Mar-1946 Date of Office Visit: 10/05/2022   History of present illness: Anne Barnes is a 77 y.o. female presenting today for allergy testing however she did take her antihistamine last night.  She has history of allergic rhinitis on immunotherapy, mild asthma in her allergy.She was last seen in the office on 09/21/2021 by Dr. Ernst Bowler.  She states her eye drops aren't working (pataday) and states her eyes are still burning and also states that the pataday burns when she put it in too.   She occasionally can have nasal congestion or drainage that can occur at night.  She states the cpap can make her stuffy too.  She is not using flonase but she does use astelin 1-2 twice a day if needed and states it "works good".  She does use singulair and xyzal daily and states if she doesn't take them she can tell a difference.  She is on allergen immunotherapy at maintenance every 3 weeks and tolerating well without large local or systemic reactions.   She has an epipen that she has not needed to use.  She avoids  shellfish and peanut.  She states she is conscious about gluten and soybean products and states its in everything and thus label reads to make sure she is avoiding in diet.   She has albuterol that she reports she uses daily as she states it helps to "cut the mucus in my throat" and she is able to bring up the mucus with albuterol use.  She also states she will rub vicks on the neck to help. She denies coughing, wheezing, shortness of breath or chest tightness.      Review of systems: Review of Systems  Constitutional: Negative.   HENT:         See HPI  Eyes:        See HPI  Respiratory: Negative.    Cardiovascular: Negative.   Gastrointestinal: Negative.   Musculoskeletal: Negative.   Skin: Negative.   Allergic/Immunologic: Negative.   Neurological: Negative.      All other systems negative unless noted above in  HPI  Past medical/social/surgical/family history have been reviewed and are unchanged unless specifically indicated below.  No changes  Medication List: Current Outpatient Medications  Medication Sig Dispense Refill   albuterol (VENTOLIN HFA) 108 (90 Base) MCG/ACT inhaler INHALE 2 PUFFS BY MOUTH EVERY 6 HOURS AS NEEDED FOR WHEEZING FOR SHORTNESS OF BREATH 18 g 12   alendronate (FOSAMAX) 70 MG tablet Take 1 tablet (70 mg total) by mouth once a week. Take with a full glass of water on an empty stomach. 4 tablet 5   ALPRAZolam (XANAX) 0.5 MG tablet TAKE 1 TABLET BY MOUTH TWICE DAILY AS NEEDED FOR ANXIETY AND FOR SLEEP 20 tablet 1   aspirin EC 81 MG tablet Take 81 mg by mouth daily. Swallow whole.     azelastine (OPTIVAR) 0.05 % ophthalmic solution Place 1 drop into both eyes 2 (two) times daily as needed. 6 mL 5   b complex vitamins tablet Take 1 tablet by mouth daily.     Cholecalciferol (VITAMIN D) 50 MCG (2000 UT) tablet Take 2,000 Units by mouth daily.     esomeprazole (NEXIUM) 40 MG capsule Take 1 capsule (40 mg total) by mouth daily. 90 capsule 1   levocetirizine (XYZAL) 5 MG tablet TAKE 1 TABLET BY MOUTH ONCE DAILY IN THE EVENING 30 tablet 5   Multiple  Vitamin (MULTIVITAMIN) tablet Take 1 tablet by mouth daily.     Olopatadine HCl (PATADAY) 0.2 % SOLN Place 1 drop into both eyes 1 day or 1 dose. 2.5 mL 0   rosuvastatin (CRESTOR) 10 MG tablet Take 1 tablet (10 mg total) by mouth daily. 90 tablet 3   spironolactone (ALDACTONE) 25 MG tablet Take 1 tablet (25 mg total) by mouth daily. 90 tablet 2   valsartan (DIOVAN) 320 MG tablet Take 1 tablet (320 mg total) by mouth daily. 90 tablet 1   VENTOLIN HFA 108 (90 Base) MCG/ACT inhaler Inhale 2 puffs into the lungs every 4 (four) hours as needed for wheezing or shortness of breath. 18 g 1   azelastine (ASTELIN) 0.1 % nasal spray Place 2 sprays into both nostrils 2 (two) times daily as needed for rhinitis. 30 mL 5   EPINEPHrine 0.3 mg/0.3 mL IJ  SOAJ injection Inject 0.3 mg into the muscle as needed for anaphylaxis. 2 each 1   fluticasone (FLONASE) 50 MCG/ACT nasal spray Place 2 sprays into both nostrils daily. (Patient not taking: Reported on 10/05/2022) 1 g 0   furosemide (LASIX) 20 MG tablet Take 1 tablet (20 mg total) by mouth 3 (three) times a week. Monday, Wednesday and Friday. 90 tablet 3   montelukast (SINGULAIR) 10 MG tablet Take 1 tablet (10 mg total) by mouth at bedtime. 30 tablet 5   No current facility-administered medications for this visit.     Known medication allergies: Allergies  Allergen Reactions   Codeine Nausea And Vomiting   Hydralazine Hcl Other (See Comments)    Pt reports causes dizziness   Lisinopril-Hydrochlorothiazide Hives    REACTION: lip swelling,facial swelling,rash.   Metoprolol Other (See Comments)    Does not feel well on it    Other Cough    Patient states she is allergic to Shrimp, egg whites, tomatoes and dairy products.  Says they make her feel unwell but do not cause hives, SOB or anaphylaxis.  She just avoids eating them because they can make her have a headache.  Strawberries, watermelon, peanuts, and all shellfish.    environmental   Shrimp [Shellfish Allergy] Other (See Comments)    Was allergy tested and showed shrimp was one--has not reacted b/c avoided     Physical examination: Blood pressure 118/70, pulse 74, temperature 98.1 F (36.7 C), temperature source Temporal, resp. rate 16, height 4' 11.5" (1.511 m), weight 232 lb 14.4 oz (105.6 kg), SpO2 96 %.  General: Alert, interactive, in no acute distress. HEENT: PERRLA, TMs pearly gray, turbinates minimally edematous with thick discharge, post-pharynx non erythematous. Neck: Supple without lymphadenopathy. Lungs: Clear to auscultation without wheezing, rhonchi or rales. {no increased work of breathing. CV: Normal S1, S2 without murmurs. Abdomen: Nondistended, nontender. Skin: Warm and dry, without lesions or  rashes. Extremities:  No clubbing, cyanosis or edema. Neuro:   Grossly intact.  Diagnositics/Labs:  Spirometry: FEV1: 0.89L 70%, FVC: 1.13L 68% predicted.  For age this is essentially a normal FEV1  Assessment and plan: Allergic rhinitis - continue avoidance measures for dust mites, cat and dog.   - continue Xyzal '5mg'$  daily - continue Singulair '10mg'$  daily - for nasal drainage and post-nasal drip continue use of nasal antihistamine, Astelin 2 sprays each nostril 2 times a day.  Hopefully this will decrease sinus mucus production that is draining down throat for which she is using albuterol now for - recommend performing nasal saline rinses prior to use of medicated nasal sprays to  help flush out the sinuses.  Use with distilled water at room temperature.  Breathe in and out through your mouth during the entire process. - stop Pataday as not effective.  Start Optivar 1 drop each eye up to twice a day as needed for itchy/watery/red eyes.  - continue allergy injections per schedule. We will discuss retesting for your allergies next visit after you reach monthly dosing with your allergy shots.   Food allergy - continue food avoidance of shellfish, gluten, soybean, peanut  - skin testing to these foods were negative at initial visit.   - have access to your epinephrine device in case of allergic reaction.   - follow emergency action plan in case of allergic reaction   Mild intermittent asthma - have access to albuterol inhaler 2 puffs every 4-6 hours as needed for cough/wheeze/shortness of breath/chest tightness.  May use 15-20 minutes prior to activity.   Monitor frequency of use.    Follow-up 9 months (after October 2024) or sooner if needed  I appreciate the opportunity to take part in [place] care. Please do not hesitate to contact me with questions.  Sincerely,   Prudy Feeler, MD Allergy/Immunology [facility] [place] [facility] [place]

## 2022-10-19 ENCOUNTER — Encounter: Payer: Self-pay | Admitting: Internal Medicine

## 2022-10-19 NOTE — Assessment & Plan Note (Signed)
Mild intermittent uncomplicated.  Currently well-controlled with occasional use of rescue inhaler and Singulair.

## 2022-10-19 NOTE — Assessment & Plan Note (Addendum)
Benefits from CPAP with good compliance and control Plan-after discussion, we are going to try reducing pressure range to 4-8

## 2022-10-22 ENCOUNTER — Ambulatory Visit: Payer: Medicare HMO

## 2022-10-22 DIAGNOSIS — R1313 Dysphagia, pharyngeal phase: Secondary | ICD-10-CM

## 2022-10-22 NOTE — Therapy (Signed)
[facility] OUTPATIENT SPEECH LANGUAGE PATHOLOGY SWALLOWING TREATMENT   Patient Name: Anne Barnes MRN: [id number] DOB:January 14, 1946, 77 y.o., female Today's Date: 10/22/2022  PCP: Anne Revels, MD REFERRING PROVIDER: Lavena Bullion, MD:    End of Session - 10/22/22 1409     Visit Number 14    Number of Visits 20    Date for SLP Re-Evaluation 11/10/22    SLP Start Time 4098    Activity Tolerance Patient tolerated treatment well                      Past Medical History:  Diagnosis Date   Acute combined systolic and diastolic heart failure (Anne Barnes) 05/26/2015   ALLERGIC RHINITIS    Allergic urticaria 08/01/2014   Patient reports allergies to shrimp, egg whites, tomatoes, dairy    Allergy    seasonal and numerous food and drug allergies.   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   Office Spirometry 11/07/17-WNL-FVC 1.58/82%, FEV1 1.27/85%, ratio 0.80, FEF 25-75% 0.26/89%   Bradycardia 02/25/2017   Chiari malformation    Noted MRI brain 09/2013 - s/p neuro eval for same   DIVERTICULOSIS, COLON    Essential hypertension 08/28/2010   Qualifier: Diagnosis of  By: Anne Barnes RMA, Anne Barnes      Frequent headaches    GERD    H/O measles    H/O mumps    Hearing loss 01/18/2017   History of chicken pox    Hyperglycemia 03/05/2016   HYPERTENSION    Hypocalcemia 02/25/2017   IBS (irritable bowel syndrome) 05/26/2015   Nonspecific abnormal electrocardiogram (ECG) (EKG)    OBSTRUCTIVE SLEEP APNEA 12/2008 dx   noncompliant with CPAP qhs   Obstructive sleep apnea 08/28/2010   NPSG 12/2008:  AHI 13/hr with desats to 78%    Preventative health care 06/11/2017   Sciatica    right side   Seasonal and perennial allergic rhinitis 08/28/2010   Allergy profile 03/11/14- positive especially for dust, cat and dog Food allergy profile- total IgE 86 with several food group elevations Sed rate-WNL      Sinusitis 06/06/2017   Sleep apnea    uses cpap   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   Vaginitis  01/18/2017   Vitamin D deficiency 06/11/2017   Past Surgical History:  Procedure Laterality Date   ABDOMINAL HYSTERECTOMY  1986   BIOPSY  07/19/2022   Procedure: BIOPSY;  Surgeon: Anne Bullion, DO;  Location: [facility];  Service: Gastroenterology;;   [place]   Breast biopsy   COLONOSCOPY  2011   Dr Anne Barnes   ESOPHAGOGASTRODUODENOSCOPY (EGD) WITH PROPOFOL N/A 07/19/2022   Procedure: ESOPHAGOGASTRODUODENOSCOPY (EGD) WITH PROPOFOL;  Surgeon: Anne Bullion, DO;  Location: [facility];  Service: Gastroenterology;  Laterality: N/A;   UMBILICAL HERNIA REPAIR     Patient Active Problem List   Diagnosis Date Noted   Dyspepsia    LUQ pain    Chronic cough    Neck pain on left side 12/13/2021   Congestion of throat 12/13/2021   Osteoporosis 12/13/2021   COVID 04/20/2021   Chest pain of uncertain etiology 11/91/4782   Allergies 12/22/2019   Dizziness 11/19/2019   Grief reaction 08/27/2019   Left shoulder pain 08/27/2019   Hyperlipidemia 07/14/2018   Asthma, mild intermittent 06/06/2018   Morbid obesity ([place]) 01/09/2018   Vitamin D deficiency 06/11/2017   Preventative health care 06/11/2017   Hypocalcemia 02/25/2017   Bradycardia 02/25/2017   Hearing loss 01/18/2017   Hyperglycemia 03/05/2016  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   CHF (congestive heart failure) ([place]) 05/26/2015   Female bladder prolapse 05/26/2015   IBS (irritable bowel syndrome) 05/26/2015   History of chicken pox    Allergic urticaria 08/01/2014   Food allergy 08/01/2014   Tinnitus 03/14/2014   Osteoarthritis    Nonspecific abnormal electrocardiogram (ECG) (EKG)    Headache 10/21/2013   Chiari malformation    GERD 11/10/2010   Obstructive sleep apnea 08/28/2010   Essential hypertension 08/28/2010   Seasonal and perennial allergic rhinitis 08/28/2010   DIVERTICULOSIS, COLON 08/28/2010   ONSET DATE: "Two years ago"   REFERRING DIAG: R13.10 (ICD-10-CM) - Dysphagia, unspecified  type  THERAPY DIAG:  Dysphagia, pharyngeal phase  Rationale for Evaluation and Treatment Rehabilitation  SUBJECTIVE:   SUBJECTIVE STATEMENT: "I've been doing a lot of practicing." Pt accompanied by: self  PERTINENT HISTORY: Anne Barnes is a 77 y.o. female with PMH: allergic rhinitis, mild asthma, OSA, CHF, nonobstructive CAD on coronary CTA, HTN, obesity, bradycardia, chiari malformation, GERD followed by GI. Per 04/27/22 GI note, patient reporting her reflux symptoms started in late 2022 and included heartburn, intermittent chest pain lasting up to a few hours, increased belching, can have associated dyspepsia. She was started on Prilosec 40 mg for four weeks with plan to titrate to lowes effective dose; EGD recommended but has not been completed.   PAIN:  Are you having pain? No   PATIENT GOALS  Regain normal swallow function  OBJECTIVE:   DIAGNOSTIC FINDINGS:  MRI HEAD WITHOUT CONTRAST  01/28/20 TECHNIQUE: Multiplanar, multiecho pulse sequences of the brain and surrounding structures were obtained without intravenous contrast. COMPARISON:  Head CT November 24, 2019. FINDINGS: Brain: No acute infarction, hemorrhage, hydrocephalus, extra-axial collection or mass lesion. Few scattered foci of T2 hyperintensity are seen within the white matter of the cerebral hemispheres, nonspecific. Prominence of the cerebral sulci in the bilateral parietal regions and prominence of the supratentorial ventricles which has progressed since prior MRI performed in September 30, 2013, but is stable from prior CT. 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 Vascular: Normal flow voids. Skull and upper cervical spine: Normal marrow signal. Sinuses/Orbits: Mild mucosal thickening of the ethmoid cells. The orbits are maintained. IMPRESSION: 1. No acute intracranial abnormality. 2. Prominence of the cerebral sulci in the bilateral parietal regions and prominence of the  supratentorial ventricles which has progressed since prior MRI performed in September 30, 2013, but is stable from prior CT performed in September 30, 2013. 3. Chiari 1 malformation.  RESULTS FROM OBJECTIVE SWALLOW STUDY (MBSS/FEES):   05/16/22: Anne Barnes presents with a normal oral phase of swallow but a moderately impaired pharyngeal phase of swallow. During pharyngeal phase she exhibited swallow initiation delay with thin liquids and at times with nectar thick liquids. She exhibited consistent aspiration during the swallow with cup sips of thin liquids and straw sips of nectar thick liquids. Aspiration occured without patient sensing until after aspirate had transited past vocal cords. She did then sense aspiration with a cough but this did not aid in expelling aspirate. When liquids were transiting, portion of liquid bolus moves under epiglottis before it comes into full contact to protect airway. She did not exhibit any aspiration when taking small sips of nectar thick liquids. No significant difficulties with puree, regular, 6m barium tablet or honey thick liquids. Aspiration appears to be impacted by reduced epiglottic deflection as pharyngeal space is more narrow (?pharyngeal edema) and cannot r/o impact from appearance of prominent cricopharyngeal bar.  When liquids transiting, portion of liquid bolus moves under epiglottis before it comes into full contact to protect airway. SLP is recommending nectar thick liquids and continue with solids as she has been. Anne Barnes was given a sample box of Simply Thick food thickener (nectar consistency). SLP recommending OP SLP for dysphagia and patient's PCP contacted via secure EPIC message.   MBSS  09-05-22 Pt demonstrates ongoing aspiration of thin liquids just before/during the swallow due to slightly late/incomplete airway protection. Pt was able to utilize a supraglottic swallow with verbal and visual cues to achieve early, firm airway closure and reduce  frequency/severity of aspiration to once in 8 attempts. An intermittent preventative cough/throat clear ejects that trace penetration that remained. Pts cough strength and awareness of swallowing mechanism was much improved after OP SLP treatment. Recommend pt resume thin liquids utilising supraglottic swallow. Emphasized benefits of oral hygiened and drinking water primarily. Pt verbalized understanding. SLP to f/u at OP to reinforce plan.     PATIENT REPORTED OUTCOME MEASURES (PROM): EAT-10: completed with pt. Score was 12. "4" reported with #2 (ability to go out for meals), and #3 (swallowing liquids takes extra effort); "2" reported with #9 (coughing when I eat/drink) and #10 (swallowing is stressful).    TODAY'S TREATMENT:  10/22/22: Anne Barnes stated she has not been 100% compliant with supraglottic swallow iwht liquids since last session, but remembers "most of the time." There were no overt s/sx aspiration PNA reported at both pulmonologist and allergist visits completed since last ST visit. "I even demonstrated my (supraglottic) swallowing to my daughter" Pt completed HEP without cues necessary. She used her precautions with liquids with independence. Anne Barnes endorsed recall of switch to nectar thick liquids if she becomes acutely ill until she recovers. Pt would like to cont with once every other two weeks for a last visit - SLP agreed.   09/26/22: No overt s/sx aspiration PNA observed and pt denies these as well, SLP reiterated to pt that if she does become acutely ill she may need to switch to nectar liquids. She drank water with precautions followed (supraglottic swallow with liquids) with rare min A faded to modified independence.  Performance of HEP was WNL. Pt agrees to cont with once every other week. If pt performance cont next session consider once/4 weeks.  09/11/22: Pt with MBS on 09-05-22, with improved ability with swallowing. Minimal silent aspiration noted. Results above in "diagnostic  findings." SLP reviewed results of this assessment with pt - she told SLP she could drink thin liquids using "the special swallow that I have to cough after." SLP ensured pt could do this correctly and she could- requiring initial min A from SLP but was consistently independent in 4 swallows. Today pt ate cereal bar and drank liquid following precautions WNL. She completed HEP with modified independence. SLP suggested pt cont at once every other week and pt agreed this was appropriate.  08/28/22: Pt req'd SBA with HEP today. She expressed skepticism at her performance but pt cont assuring Allis she was performing HEP to SLP satisfaction. SLP sent order through referring MD for follow up MBS today.   08/15/22: Pt completed HEP with occasional min A for appropriate breath for supraglottic and occasional min A for Mendelsohn, faded to independent with HEP.   08/08/22: Pt arrives 13 minutes late today due to son bringing Zamorah. Pt is wearing adequately fitting dentures now. She performed HEP with modified independence. Intermittent difficulty with holding breath due to respiratory challenges today. Pt  took inhaler in first 15 minutes of session. She is agreeable to reducing to once/week due to progress, with referral for f/u MBS requested first part of December. Pt was curious about cheaper option for thickener than [employer] - SLP suggested thickener powder from [facility] - looked online and 140 servings for $21.50.  08/01/22: Pt returns today after anxiety attack and medical procedure (EGD). Recertification today. She completed HEP regularly since last session and actually does more exercises from memory than requiring the sheet to look off of. Pt was mod I with procedure today, and followed precautions independently. If pt has same success next session possible decr in frequency will be considered.  07/04/22: Anne Barnes states that she feels like the exercises are going better when she has her new dentures placed.  Today she does not have them placed due to being adjusted.  Pt stated, "I'm thinking that I might need to just make (HEP) a part of my life from now on, and that's ok." Her procedure was WNL for all exercises. Pt stated she was doing 5 of effortful and then waiting 15 minutes and doing 5 more. SLP told pt rest no more than 2 minutes between 5 rep sets. She demo'd understanding. SLP told pt she could add 2-5 more reps to any exercise that after 10 reps it feels like pt could do more reps.  Pt procedure was excellent today and SLP and pt agreed she could return every 2 weeks, with target follow up MBS date the week of 08-06-22..   06/20/22: Pt completed her HEP with modified independence - SLP provided reassurance she was performing correctly. Pt did 5-7 of each exercise without cues necessary but pt cont'd to look at her HEP sheet for assistance. Pt continues to have nectar-liquids at home. Performed HEP with touching her tongue to water if necessary to have something wet to swallow in order to perform HEP. If pt looks the same next session we will decr to once/week.   06/13/22: Pt reports "some mornings I just wake up very mucousy. Today I am." SLP hears lung/pharyngeal congestion today. Anne Barnes reports hypersensitivity in her throat and nose/lungs is evident when this occurs. Coughing with 65% of Mendelsohn. "It's not because it's going down the wrong way, it's because of that mucous." Pt performed HEP correctly 95% of the time but cont'd to say, "That wasn't right" for approx 25% of her attempts. SLP cont'd to encourage pt that her procedure was correct 95% of the time, and she corrected herself with SLP A when she was incorrect.   06/08/22: Pt again req'd rare min A for HEP procedure but was modified independent by session end. Often she stated that the rep was not good but it was WNL with slight modification (e.g., air escape on supraglottic). Anne Barnes told SLP she was primarily performing [place] and  supraglottic and SLP encouraged her to perform all.   06/06/22: Pt req'd min A initially for [facility] but was independent after x6 reps. Otherwise pt was modified independent with HEP. Pt stated, "I know this is helping, my swallowing is different and better." Pt told SLP rationale for nectar liquids correctly.  PATIENT EDUCATION: Education details: see above in "today's treatment" Person educated: Patient Education method: Explanation, Demonstration, Verbal cues, and Handouts Education comprehension: verbalized understanding, returned demonstration, verbal cues required, and needs further education   ASSESSMENT:  CLINICAL IMPRESSION: MBS on 09-05-22, results above in "diagnostic findings". Pt now is safe to drink thin liquids with superglottic swallow. Anne Barnes  was independent with HEP and with precautions for liquids today. SLP reminded pt that if she gets acutely ill she many need to switch to nectar thick liquids. SLP modified one LTG to match pt's desire for one more ST session in two weeks. Pt without notable neurological history, so SLP suspects pharyngeal dysphagia has a deconditioning component. There is also question esophageal component - underwent EGD procedure 07-21-22 and her reflux med changed to Nexium from Omeprazole. Pt continues as highly motivated. Pt completed HEP with modified independence.  OBJECTIVE IMPAIRMENTS include dysphagia. These impairments are limiting patient from safety when swallowing. Factors affecting potential to achieve goals and functional outcome are co-morbidities (esophageal component?). Patient will benefit from skilled SLP services to address above impairments and improve overall function.  REHAB POTENTIAL: Good   GOALS: Goals reviewed with patient? Yes  SHORT TERM GOALS: Target date: 06/19/2022    Pt will perform HEP with rare min A in 2 sessions Baseline: 06-06-22 Goal status: Met  2.  Pt will tell SLP 3 overt s/s aspiration PNA with modified  independence in two sessions Baseline: 06/06/22, 06-11-22 Goal status: Met  3.  Pt will tell SLP rationale for nectar liquids in two sessions Baseline: 05/30/22 Goal status: Met   LONG TERM GOALS: Target date: 07/24/2022  New date:11/10/22    Pt will perform HEP with modified independence in 4 sessions Baseline: 06-20-22, 08-01-22, 08-08-22 Goal status: Met  2.  Pt will participate in follow up MBS when clinically indicated  Baseline:  Goal Status: Met  3. Pt will follow HEP with modified independence with therapy at once every other week, x4 visits Baseline: 09-11-22, 09-26-22, 10-22-22 Goal Status: modified  PLAN: SLP FREQUENCY: x1/ every other week  SLP DURATION: other: 4 visits  PLANNED INTERVENTIONS: Aspiration precaution training, Pharyngeal strengthening exercises, Diet toleration management , Trials of upgraded texture/liquids, Internal/external aids, and Functional tasks    [facility], [place] 10/22/2022, 2:10 PM

## 2022-10-23 ENCOUNTER — Ambulatory Visit: Payer: Medicare HMO | Admitting: Family Medicine

## 2022-10-23 VITALS — BP 122/74 | HR 74 | Temp 97.5°F | Resp 16 | Ht 59.0 in | Wt 238.4 lb

## 2022-10-23 DIAGNOSIS — R739 Hyperglycemia, unspecified: Secondary | ICD-10-CM | POA: Diagnosis not present

## 2022-10-23 DIAGNOSIS — T7840XD Allergy, unspecified, subsequent encounter: Secondary | ICD-10-CM | POA: Diagnosis not present

## 2022-10-23 DIAGNOSIS — M810 Age-related osteoporosis without current pathological fracture: Secondary | ICD-10-CM

## 2022-10-23 DIAGNOSIS — R053 Chronic cough: Secondary | ICD-10-CM

## 2022-10-23 DIAGNOSIS — J4520 Mild intermittent asthma, uncomplicated: Secondary | ICD-10-CM | POA: Diagnosis not present

## 2022-10-23 DIAGNOSIS — E7800 Pure hypercholesterolemia, unspecified: Secondary | ICD-10-CM | POA: Diagnosis not present

## 2022-10-23 DIAGNOSIS — E559 Vitamin D deficiency, unspecified: Secondary | ICD-10-CM | POA: Diagnosis not present

## 2022-10-23 DIAGNOSIS — I10 Essential (primary) hypertension: Secondary | ICD-10-CM | POA: Diagnosis not present

## 2022-10-23 LAB — COMPREHENSIVE METABOLIC PANEL
ALT: 21 U/L (ref 0–35)
AST: 16 U/L (ref 0–37)
Albumin: 3.8 g/dL (ref 3.5–5.2)
Alkaline Phosphatase: 72 U/L (ref 39–117)
BUN: 23 mg/dL (ref 6–23)
CO2: 27 mEq/L (ref 19–32)
Calcium: 8.9 mg/dL (ref 8.4–10.5)
Chloride: 103 mEq/L (ref 96–112)
Creatinine, Ser: 0.85 mg/dL (ref 0.40–1.20)
GFR: 67.16 mL/min (ref >60.00)
Glucose, Bld: 85 mg/dL (ref 70–99)
Potassium: 3.9 mEq/L (ref 3.5–5.1)
Sodium: 139 mEq/L (ref 135–145)
Total Bilirubin: 0.4 mg/dL (ref 0.2–1.2)
Total Protein: 6.6 g/dL (ref 6.0–8.3)

## 2022-10-23 LAB — CBC WITH DIFFERENTIAL/PLATELET
Basophils Absolute: 0.1 10*3/uL (ref 0.0–0.1)
Basophils Relative: 0.7 % (ref 0.0–3.0)
Eosinophils Absolute: 0.4 10*3/uL (ref 0.0–0.7)
Eosinophils Relative: 4.5 % (ref 0.0–5.0)
HCT: 37.2 % (ref 36.0–46.0)
Hemoglobin: 12.5 g/dL (ref 12.0–15.0)
Lymphocytes Relative: 17.8 % (ref 12.0–46.0)
Lymphs Abs: 1.6 10*3/uL (ref 0.7–4.0)
MCHC: 33.7 g/dL (ref 30.0–36.0)
MCV: 88.8 fl (ref 78.0–100.0)
Monocytes Absolute: 0.9 10*3/uL (ref 0.1–1.0)
Monocytes Relative: 9.8 % (ref 3.0–12.0)
Neutro Abs: 5.9 10*3/uL (ref 1.4–7.7)
Neutrophils Relative %: 67.2 % (ref 43.0–77.0)
Platelets: 231 10*3/uL (ref 150.0–400.0)
RBC: 4.19 Mil/uL (ref 3.87–5.11)
RDW: 13.7 % (ref 11.5–15.5)
WBC: 8.8 10*3/uL (ref 4.0–10.5)

## 2022-10-23 LAB — LIPID PANEL
Cholesterol: 122 mg/dL (ref 0–200)
HDL: 42.4 mg/dL (ref >39.00)
LDL Cholesterol: 70 mg/dL (ref 0–99)
NonHDL: 79.27
Total CHOL/HDL Ratio: 3
Triglycerides: 46 mg/dL (ref 0.0–149.0)
VLDL: 9.2 mg/dL (ref 0.0–40.0)

## 2022-10-23 LAB — VITAMIN D 25 HYDROXY (VIT D DEFICIENCY, FRACTURES): VITD: 34.58 ng/mL (ref 30.00–100.00)

## 2022-10-23 LAB — TSH: TSH: 1.82 u[IU]/mL (ref 0.35–5.50)

## 2022-10-23 LAB — HEMOGLOBIN A1C: Hgb A1c MFr Bld: 5.7 % (ref 4.6–6.5)

## 2022-10-23 NOTE — Patient Instructions (Addendum)
Shingrix is the new shingles shot, 2 shots over 2-6 months, confirm coverage with insurance and document, then can return here for shots with nurse appt or at pharmacy   RSV, Respiratory Syncitial Virus vaccine, Arexvy at pharmacy  Tetanus due in 2026 but taker sooner if you get injured  COVID booster    Allergies, Adult An allergy is a condition in which the body's defense system (immune system) comes in contact with an allergen and reacts to it. An allergen is anything that causes an allergic reaction. Allergens cause the immune system to make proteins for fighting infections (antibodies). These antibodies cause cells to release chemicals called histamines that set off the symptoms of an allergic reaction. Allergies often affect the nasal passages (allergic rhinitis), eyes (allergic conjunctivitis), skin (atopic dermatitis), and stomach. Allergies can be mild, moderate, or severe. They cannot spread from person to person. Allergies can develop at any age and may be outgrown. What are the causes? This condition is caused by allergens. Common allergens include: Outdoor allergens, such as pollen, car fumes, and mold. Indoor allergens, such as dust, smoke, mold, and pet dander. Other allergens, such as foods, medicines, scents, insect bites or stings, and other skin irritants. What increases the risk? You are more likely to develop this condition if you have: Family members with allergies. Family members who have any condition that may be caused by allergens, such as asthma. This may make you more likely to have other allergies. What are the signs or symptoms? Symptoms of this condition depend on the severity of the allergy. Mild to moderate symptoms Runny nose, stuffy nose (nasal congestion), or sneezing. Itchy mouth, ears, or throat. A feeling of mucus dripping down the back of your throat (postnasal drip). Sore throat. Itchy, red, watery, or puffy eyes. Skin rash, or itchy, red,  swollen areas of skin (hives). Stomach cramps or bloating. Severe symptoms Severe allergies to food, medicine, or insect bites may cause anaphylaxis, which can be life-threatening. Symptoms include: A red (flushed) face. Wheezing or coughing. Swollen lips, tongue, or mouth. Tight or swollen throat. Chest pain or tightness, or rapid heartbeat. Trouble breathing or shortness of breath. Pain in the abdomen, vomiting, or diarrhea. Dizziness or fainting. How is this diagnosed? This condition is diagnosed based on your symptoms, your family and medical history, and a physical exam. You may also have tests, including: Skin tests to see how your skin reacts to allergens that may be causing your symptoms. Tests include: Skin prick test. For this test, an allergen is introduced to your body through a small opening in the skin. Intradermal skin test. For this test, a small amount of allergen is injected under the first layer of your skin. Patch test. For this test, a small amount of allergen is placed on your skin. The area is covered and then checked after a few days. Blood tests. A challenge test. For this test, you will eat or breathe in a small amount of allergen to see if you have an allergic reaction. You may also be asked to: Keep a food diary. This is a record of all the foods, drinks, and symptoms you have in a day. Try an elimination diet. To do this: Remove certain foods from your diet. Add those foods back one by one to find out if any foods cause an allergic reaction. How is this treated?     Treatment for allergies depends on your symptoms. Treatment may include: Cold, wet cloths (cold compresses) to soothe itching  and swelling. Eye drops or nasal sprays. Nasal irrigation to help clear your mucus or keep the nasal passages moist. A humidifier to add moisture to the air. Skin creams to treat rashes or itching. Oral antihistamines or other medicines to block the reaction or to  treat inflammation. Diet changes to remove foods that cause allergies. Being exposed again and again to tiny amounts of allergens to help you build a defense against it (tolerance). This is called immunotherapy. Examples include: Allergy shot. You receive an injection that contains an allergen. Sublingual immunotherapy. You take a small dose of allergen under your tongue. Emergency injection for anaphylaxis. You give yourself a shot using a syringe (auto-injector) that contains the amount of medicine you need. Your health care provider will teach you how to give yourself an injection. Follow these instructions at home: Medicines  Take or apply over-the-counter and prescription medicines only as told by your health care provider. Always carry your auto-injector pen if you are at risk of anaphylaxis. Give yourself an injection as told by your health care provider. Eating and drinking Follow instructions from your health care provider about eating or drinking restrictions. Drink enough fluid to keep your urine pale yellow. General instructions Wear a medical alert bracelet or necklace to let others know that you have had anaphylaxis before. Avoid known allergens whenever possible. Keep all follow-up visits as told by your health care provider. This is important. Contact a health care provider if: Your symptoms do not get better with treatment. Get help right away if: You have symptoms of anaphylaxis. These include: Swollen mouth, tongue, or throat. Pain or tightness in your chest. Trouble breathing or shortness of breath. Dizziness or fainting. Severe abdominal pain, vomiting, or diarrhea. These symptoms may represent a serious problem that is an emergency. Do not wait to see if the symptoms will go away. Get medical help right away. Call your local emergency services (911 in the [place] Do not drive yourself to the hospital. Summary Take or apply over-the-counter and prescription medicines  only as told by your health care provider. Avoid known allergens when possible. Always carry your auto-injector pen if you are at risk of anaphylaxis. Give yourself an injection as told by your health care provider. Wear a medical alert bracelet or necklace to let others know that you have had anaphylaxis before. Anaphylaxis is a life-threatening emergency. Get help right away. This information is not intended to replace advice given to you by your health care provider. Make sure you discuss any questions you have with your health care provider. Document Revised: 05/09/2020 Document Reviewed: 07/22/2019 Elsevier Patient Education  [place].

## 2022-10-23 NOTE — Progress Notes (Signed)
Subjective:   By signing my name below, I, Kellie Simmering, attest that this documentation has been prepared under the direction and in the presence of Mosie Lukes, MD., 10/23/2022.   Patient ID: Anne Barnes, female    DOB: 1946-03-20, 77 y.o.   MRN: [id number]  Chief Complaint  Patient presents with   Follow-up    Follow up   HPI Patient is in today for an office visit. She denies CP/palpitations/SOB/fever/chills/GI or GU symptoms.  Cough/Congestion Patient reports today with cough, congestion, and sinus headache. Denies fever/chills. She reports this has been a persistent problem and has been managing this at [facility]. She has been receiving injections to manage her allergies for 2 years. She has also been following with pulmonologist Dr. Annamaria Boots. She was seen by [place] yesterday, on 10/22/2022, for speech therapy to manage pain when swallowing.  Hypertension Patient reports that her cardiologist Dr. Johney Frame has adjusted her medications but she continues to experience lower extremity swelling.   Vitamin D Deficiency Patient continues to take calcium and vitamin D 2000 IU daily.  Weight Loss She complains of weight gain and is interesting in taking injectable weight loss medications if her insurance will cover these. Body mass index is 48.15 kg/m. Wt Readings from Last 3 Encounters:  10/23/22 238 lb 6.4 oz (108.1 kg)  10/05/22 232 lb 14.4 oz (105.6 kg)  09/27/22 237 lb 12.8 oz (107.9 kg)   Lab Results  Component Value Date   HGBA1C 5.7 10/23/2022   Past Medical History:  Diagnosis Date   Acute combined systolic and diastolic heart failure ([place]) 05/26/2015   ALLERGIC RHINITIS    Allergic urticaria 08/01/2014   Patient reports allergies to shrimp, egg whites, tomatoes, dairy    Allergy    seasonal and numerous food and drug allergies.   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   Office Spirometry 11/07/17-WNL-FVC 1.58/82%, FEV1  1.27/85%, ratio 0.80, FEF 25-75% 0.26/89%   Bradycardia 02/25/2017   Chiari malformation    Noted MRI brain 09/2013 - s/p neuro eval for same   DIVERTICULOSIS, COLON    Essential hypertension 08/28/2010   Qualifier: Diagnosis of  By: Marca Ancona RMA, Lucy      Frequent headaches    GERD    H/O measles    H/O mumps    Hearing loss 01/18/2017   History of chicken pox    Hyperglycemia 03/05/2016   HYPERTENSION    Hypocalcemia 02/25/2017   IBS (irritable bowel syndrome) 05/26/2015   Nonspecific abnormal electrocardiogram (ECG) (EKG)    OBSTRUCTIVE SLEEP APNEA 12/2008 dx   noncompliant with CPAP qhs   Obstructive sleep apnea 08/28/2010   NPSG 12/2008:  AHI 13/hr with desats to 78%    Preventative health care 06/11/2017   Sciatica    right side   Seasonal and perennial allergic rhinitis 08/28/2010   Allergy profile 03/11/14- positive especially for dust, cat and dog Food allergy profile- total IgE 86 with several food group elevations Sed rate-WNL      Sinusitis 06/06/2017   Sleep apnea    uses cpap   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   Vaginitis 01/18/2017   Vitamin D deficiency 06/11/2017    Past Surgical History:  Procedure Laterality Date   ABDOMINAL HYSTERECTOMY  1986   BIOPSY  07/19/2022   Procedure: BIOPSY;  Surgeon: Lavena Bullion, DO;  Location: [facility];  Service: Gastroenterology;;   [place]   Breast biopsy  COLONOSCOPY  2011   Dr Collene Mares   ESOPHAGOGASTRODUODENOSCOPY (EGD) WITH PROPOFOL N/A 07/19/2022   Procedure: ESOPHAGOGASTRODUODENOSCOPY (EGD) WITH PROPOFOL;  Surgeon: Lavena Bullion, DO;  Location: [facility];  Service: Gastroenterology;  Laterality: N/A;   UMBILICAL HERNIA REPAIR      Family History  Problem Relation Age of Onset   Arthritis Mother        died of complications from hip surgery   Arthritis Father    Kidney disease Father    Pneumonia Father    Heart disease Father    Lupus Daughter    Rheum arthritis Daughter     Sjogren's syndrome Daughter    Fibromyalgia Daughter    Diabetes Brother    Heart disease Brother    Heart attack Brother    Alcohol abuse Brother    Throat cancer Brother    Lung cancer Brother    Kidney disease Sister        dialysis   Obesity Sister    Hypertension Maternal Grandfather    Hypertension Sister    Kidney disease Sister        dialysis   Pneumonia Brother    Hypertension Daughter    Graves' disease Daughter    Hypertension Daughter    Alcohol abuse Other        parent   Ataxia Neg Hx    Chorea Neg Hx    Dementia Neg Hx    Mental retardation Neg Hx    Migraines Neg Hx    Multiple sclerosis Neg Hx    Neurofibromatosis Neg Hx    Neuropathy Neg Hx    Parkinsonism Neg Hx    Seizures Neg Hx    Stroke Neg Hx    Colon cancer Neg Hx    Colon polyps Neg Hx    Esophageal cancer Neg Hx    Rectal cancer Neg Hx    Stomach cancer Neg Hx     Social History   Socioeconomic History   Marital status: Widowed    Spouse name: Not on file   Number of children: 5   Years of education: Not on file   Highest education level: Not on file  Occupational History   Occupation: retired  Tobacco Use   Smoking status: Never   Smokeless tobacco: Never   Tobacco comments:    dtr 2 g-kids.  Vaping Use   Vaping Use: Never used  Substance and Sexual Activity   Alcohol use: No    Comment: rare   Drug use: No   Sexual activity: Not Currently  Other Topics Concern   Not on file  Social History Narrative   Right handed   One story with daughter and two grandkids   Social Determinants of Health   Financial Resource Strain: Low Risk  (06/04/2022)   Overall Financial Resource Strain (CARDIA)    Difficulty of Paying Living Expenses: Not hard at all  Food Insecurity: No Food Insecurity (06/04/2022)   Hunger Vital Sign    Worried About Running Out of Food in the Last Year: Never true    Ran Out of Food in the Last Year: Never true  Transportation Needs: No Transportation  Needs (06/04/2022)   PRAPARE - Hydrologist (Medical): No    Lack of Transportation (Non-Medical): No  Physical Activity: Insufficiently Active (06/04/2022)   Exercise Vital Sign    Days of Exercise per Week: 4 days    Minutes of Exercise per Session: 30  min  Stress: No Stress Concern Present (06/04/2022)   [place]    Feeling of Stress : Not at all  Social Connections: Moderately Isolated (06/04/2022)   Social Connection and Isolation Panel [NHANES]    Frequency of Communication with Friends and Family: More than three times a week    Frequency of Social Gatherings with Friends and Family: More than three times a week    Attends Religious Services: More than 4 times per year    Active Member of [employer] or Organizations: No    Attends Archivist Meetings: Never    Marital Status: Divorced  Human resources officer Violence: Not At Risk (06/04/2022)   Humiliation, Afraid, Rape, and Kick questionnaire    Fear of Current or Ex-Partner: No    Emotionally Abused: No    Physically Abused: No    Sexually Abused: No    Outpatient Medications Prior to Visit  Medication Sig Dispense Refill   albuterol (VENTOLIN HFA) 108 (90 Base) MCG/ACT inhaler INHALE 2 PUFFS BY MOUTH EVERY 6 HOURS AS NEEDED FOR WHEEZING FOR SHORTNESS OF BREATH 18 g 12   alendronate (FOSAMAX) 70 MG tablet Take 1 tablet (70 mg total) by mouth once a week. Take with a full glass of water on an empty stomach. 4 tablet 5   ALPRAZolam (XANAX) 0.5 MG tablet TAKE 1 TABLET BY MOUTH TWICE DAILY AS NEEDED FOR ANXIETY AND FOR SLEEP 20 tablet 1   aspirin EC 81 MG tablet Take 81 mg by mouth daily. Swallow whole.     azelastine (ASTELIN) 0.1 % nasal spray Place 2 sprays into both nostrils 2 (two) times daily as needed for rhinitis. 30 mL 5   azelastine (OPTIVAR) 0.05 % ophthalmic solution Place 1 drop into both eyes 2 (two) times daily as needed. 6  mL 5   b complex vitamins tablet Take 1 tablet by mouth daily.     Cholecalciferol (VITAMIN D) 50 MCG (2000 UT) tablet Take 2,000 Units by mouth daily.     EPINEPHrine 0.3 mg/0.3 mL IJ SOAJ injection Inject 0.3 mg into the muscle as needed for anaphylaxis. 2 each 1   esomeprazole (NEXIUM) 40 MG capsule Take 1 capsule (40 mg total) by mouth daily. 90 capsule 1   levocetirizine (XYZAL) 5 MG tablet TAKE 1 TABLET BY MOUTH ONCE DAILY IN THE EVENING 30 tablet 5   montelukast (SINGULAIR) 10 MG tablet Take 1 tablet (10 mg total) by mouth at bedtime. 30 tablet 5   Multiple Vitamin (MULTIVITAMIN) tablet Take 1 tablet by mouth daily.     Olopatadine HCl (PATADAY) 0.2 % SOLN Place 1 drop into both eyes 1 day or 1 dose. 2.5 mL 0   rosuvastatin (CRESTOR) 10 MG tablet Take 1 tablet (10 mg total) by mouth daily. 90 tablet 3   spironolactone (ALDACTONE) 25 MG tablet Take 1 tablet (25 mg total) by mouth daily. 90 tablet 2   valsartan (DIOVAN) 320 MG tablet Take 1 tablet (320 mg total) by mouth daily. 90 tablet 1   VENTOLIN HFA 108 (90 Base) MCG/ACT inhaler Inhale 2 puffs into the lungs every 4 (four) hours as needed for wheezing or shortness of breath. 18 g 1   furosemide (LASIX) 20 MG tablet Take 1 tablet (20 mg total) by mouth 3 (three) times a week. Monday, Wednesday and Friday. 90 tablet 3   No facility-administered medications prior to visit.    Allergies  Allergen Reactions  Codeine Nausea And Vomiting   Hydralazine Hcl Other (See Comments)    Pt reports causes dizziness   Lisinopril-Hydrochlorothiazide Hives    REACTION: lip swelling,facial swelling,rash.   Metoprolol Other (See Comments)    Does not feel well on it    Other Cough    Patient states she is allergic to Shrimp, egg whites, tomatoes and dairy products.  Says they make her feel unwell but do not cause hives, SOB or anaphylaxis.  She just avoids eating them because they can make her have a headache.  Strawberries, watermelon, peanuts,  and all shellfish.    environmental   Shrimp [Shellfish Allergy] Other (See Comments)    Was allergy tested and showed shrimp was one--has not reacted b/c avoided    Review of Systems  Constitutional:  Negative for chills and fever.  HENT:  Positive for congestion.   Respiratory:  Positive for cough and sputum production. Negative for shortness of breath.   Cardiovascular:  Negative for chest pain and palpitations.  Gastrointestinal:  Negative for abdominal pain, blood in stool, constipation, diarrhea, nausea and vomiting.  Genitourinary:  Negative for dysuria, frequency, hematuria and urgency.  Skin:           Neurological:  Positive for headaches.       Objective:    Physical Exam Constitutional:      General: She is not in acute distress.    Appearance: Normal appearance. She is normal weight. She is not ill-appearing.  HENT:     Head: Normocephalic and atraumatic.     Right Ear: External ear normal.     Left Ear: External ear normal.     Nose: Nose normal.     Mouth/Throat:     Mouth: Mucous membranes are moist.     Pharynx: Oropharynx is clear.  Eyes:     General:        Right eye: No discharge.        Left eye: No discharge.     Extraocular Movements: Extraocular movements intact.     Conjunctiva/sclera: Conjunctivae normal.     Pupils: Pupils are equal, round, and reactive to light.  Cardiovascular:     Rate and Rhythm: Normal rate and regular rhythm.     Pulses: Normal pulses.     Heart sounds: Normal heart sounds. No murmur heard.    No gallop.  Pulmonary:     Effort: Pulmonary effort is normal. No respiratory distress.     Breath sounds: Normal breath sounds. No wheezing or rales.  Abdominal:     General: Bowel sounds are normal.     Palpations: Abdomen is soft.     Tenderness: There is no abdominal tenderness. There is no guarding.  Musculoskeletal:        General: Normal range of motion.     Cervical back: Normal range of motion.     Right lower  leg: No edema.     Left lower leg: No edema.  Skin:    General: Skin is warm and dry.  Neurological:     Mental Status: She is alert and oriented to person, place, and time.  Psychiatric:        Mood and Affect: Mood normal.        Behavior: Behavior normal.        Judgment: Judgment normal.     BP 122/74 (BP Location: Right Arm, Patient Position: Sitting, Cuff Size: Normal)   Pulse 74   Temp (!) 97.5 F (  36.4 C) (Oral)   Resp 16   Ht '4\' 11"'$  (1.499 m)   Wt 238 lb 6.4 oz (108.1 kg)   SpO2 98%   BMI 48.15 kg/m  Wt Readings from Last 3 Encounters:  10/23/22 238 lb 6.4 oz (108.1 kg)  10/05/22 232 lb 14.4 oz (105.6 kg)  09/27/22 237 lb 12.8 oz (107.9 kg)    Diabetic Foot Exam - Simple   No data filed    Lab Results  Component Value Date   WBC 8.8 10/23/2022   HGB 12.5 10/23/2022   HCT 37.2 10/23/2022   PLT 231.0 10/23/2022   GLUCOSE 85 10/23/2022   CHOL 122 10/23/2022   TRIG 46.0 10/23/2022   HDL 42.40 10/23/2022   LDLCALC 70 10/23/2022   ALT 21 10/23/2022   AST 16 10/23/2022   NA 139 10/23/2022   K 3.9 10/23/2022   CL 103 10/23/2022   CREATININE 0.85 10/23/2022   BUN 23 10/23/2022   CO2 27 10/23/2022   TSH 1.82 10/23/2022   INR 1.06 01/10/2015   HGBA1C 5.7 10/23/2022    Lab Results  Component Value Date   TSH 1.82 10/23/2022   Lab Results  Component Value Date   WBC 8.8 10/23/2022   HGB 12.5 10/23/2022   HCT 37.2 10/23/2022   MCV 88.8 10/23/2022   PLT 231.0 10/23/2022   Lab Results  Component Value Date   NA 139 10/23/2022   K 3.9 10/23/2022   CO2 27 10/23/2022   GLUCOSE 85 10/23/2022   BUN 23 10/23/2022   CREATININE 0.85 10/23/2022   BILITOT 0.4 10/23/2022   ALKPHOS 72 10/23/2022   AST 16 10/23/2022   ALT 21 10/23/2022   PROT 6.6 10/23/2022   ALBUMIN 3.8 10/23/2022   CALCIUM 8.9 10/23/2022   ANIONGAP 9 09/15/2017   EGFR 58 (L) 08/13/2022   GFR 67.16 10/23/2022   Lab Results  Component Value Date   CHOL 122 10/23/2022   Lab  Results  Component Value Date   HDL 42.40 10/23/2022   Lab Results  Component Value Date   LDLCALC 70 10/23/2022   Lab Results  Component Value Date   TRIG 46.0 10/23/2022   Lab Results  Component Value Date   CHOLHDL 3 10/23/2022   Lab Results  Component Value Date   HGBA1C 5.7 10/23/2022      Assessment & Plan:  Immunizations: Reviewed patient's immunization history. Encouraged COVID-19, RSV, and Shingles immunizations and Tetanus immunization if injured.  Labs: Routine blood work completed today. Problem List Items Addressed This Visit     Allergies    Have been severe for her for years but are slowly improving after several years of allergy shots. She has struggled with some dysphagia and discomfort with swallowing so she is working with speech pathology      Asthma, mild intermittent - Primary   Relevant Orders   Hemoglobin A1c (Completed)   CBC with Differential/Platelet (Completed)   Comprehensive metabolic panel (Completed)   Lipid panel (Completed)   TSH (Completed)   Chronic cough   Essential hypertension    Well controlled, no changes to meds. Encouraged heart healthy diet such as the DASH diet and exercise as tolerated.       Hyperglycemia    hgba1c acceptable, minimize simple carbs. Increase exercise as tolerated.       Hyperlipidemia    Tolerating statin, encouraged heart healthy diet, avoid trans fats, minimize simple carbs and saturated fats. Increase exercise as tolerated. Is tolerating  Rosuvastatin      Morbid obesity ([place])    Encouraged DASH or MIND diet, decrease po intake and increase exercise as tolerated. Needs 7-8 hours of sleep nightly. Avoid trans fats, eat small, frequent meals every 4-5 hours with lean proteins, complex carbs and healthy fats. Minimize simple carbs, high fat foods and processed foods       Osteoporosis   Relevant Orders   VITAMIN D 25 Hydroxy (Vit-D Deficiency, Fractures) (Completed)   Vitamin D deficiency     Supplement and monitor      No orders of the defined types were placed in this encounter.  I, Penni Homans, MD, personally preformed the services described in this documentation.  All medical record entries made by the scribe were at my direction and in my presence.  I have reviewed the chart and discharge instructions (if applicable) and agree that the record reflects my personal performance and is accurate and complete. 10/23/2022  I,Mohammed Iqbal,acting as a scribe for Penni Homans, MD.,have documented all relevant documentation on the behalf of Penni Homans, MD,as directed by  Penni Homans, MD while in the presence of Penni Homans, MD.  Penni Homans, MD

## 2022-10-26 ENCOUNTER — Ambulatory Visit (INDEPENDENT_AMBULATORY_CARE_PROVIDER_SITE_OTHER): Payer: Medicare HMO | Admitting: *Deleted

## 2022-10-26 DIAGNOSIS — J309 Allergic rhinitis, unspecified: Secondary | ICD-10-CM

## 2022-10-28 NOTE — Assessment & Plan Note (Signed)
Encouraged DASH or MIND diet, decrease po intake and increase exercise as tolerated. Needs 7-8 hours of sleep nightly. Avoid trans fats, eat small, frequent meals every 4-5 hours with lean proteins, complex carbs and healthy fats. Minimize simple carbs, high fat foods and processed foods 

## 2022-10-28 NOTE — Progress Notes (Signed)
Subjective:   By signing my name below, I, Kellie Simmering, attest that this documentation has been prepared under the direction and in the presence of Mosie Lukes, MD., 10/23/2022.   Patient ID: Anne Barnes, female    DOB: 02-23-1946, 77 y.o.   MRN: [id number]  Chief Complaint  Patient presents with   Follow-up    Follow up   HPI Patient is in today for an office visit. She denies CP/palpitations/SOB/fever/chills/GI or GU symptoms.  Cough/Congestion Patient reports today with cough, congestion, and sinus headache. Denies fever/chills. She reports this has been a persistent problem and has been managing this at [facility]. She has been receiving injections to manage her allergies for 2 years. She has also been following with pulmonologist Dr. Annamaria Boots. She was seen by [facility] yesterday, on 10/22/2022, for speech therapy to manage pain when swallowing.  Hypertension Patient reports that her cardiologist Dr. Johney Frame has adjusted her medications but she continues to experience lower extremity swelling.   Vitamin D Deficiency Patient continues to take calcium and vitamin D 2000 IU daily.  Weight Loss She complains of weight gain and is interesting in taking injectable weight loss medications if her insurance will cover these. Body mass index is 48.15 kg/m. Wt Readings from Last 3 Encounters:  10/23/22 238 lb 6.4 oz (108.1 kg)  10/05/22 232 lb 14.4 oz (105.6 kg)  09/27/22 237 lb 12.8 oz (107.9 kg)   Lab Results  Component Value Date   HGBA1C 5.7 10/23/2022   Past Medical History:  Diagnosis Date   Acute combined systolic and diastolic heart failure ([place]) 05/26/2015   ALLERGIC RHINITIS    Allergic urticaria 08/01/2014   Patient reports allergies to shrimp, egg whites, tomatoes, dairy    Allergy    seasonal and numerous food and drug allergies.   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   Office Spirometry 11/07/17-WNL-FVC 1.58/82%, FEV1  1.27/85%, ratio 0.80, FEF 25-75% 0.26/89%   Bradycardia 02/25/2017   Chiari malformation    Noted MRI brain 09/2013 - s/p neuro eval for same   DIVERTICULOSIS, COLON    Essential hypertension 08/28/2010   Qualifier: Diagnosis of  By: Marca Ancona RMA, Lucy      Frequent headaches    GERD    H/O measles    H/O mumps    Hearing loss 01/18/2017   History of chicken pox    Hyperglycemia 03/05/2016   HYPERTENSION    Hypocalcemia 02/25/2017   IBS (irritable bowel syndrome) 05/26/2015   Nonspecific abnormal electrocardiogram (ECG) (EKG)    OBSTRUCTIVE SLEEP APNEA 12/2008 dx   noncompliant with CPAP qhs   Obstructive sleep apnea 08/28/2010   NPSG 12/2008:  AHI 13/hr with desats to 78%    Preventative health care 06/11/2017   Sciatica    right side   Seasonal and perennial allergic rhinitis 08/28/2010   Allergy profile 03/11/14- positive especially for dust, cat and dog Food allergy profile- total IgE 86 with several food group elevations Sed rate-WNL      Sinusitis 06/06/2017   Sleep apnea    uses cpap   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   Vaginitis 01/18/2017   Vitamin D deficiency 06/11/2017    Past Surgical History:  Procedure Laterality Date   ABDOMINAL HYSTERECTOMY  1986   BIOPSY  07/19/2022   Procedure: BIOPSY;  Surgeon: Lavena Bullion, DO;  Location: [facility];  Service: Gastroenterology;;   [place]   Breast biopsy  COLONOSCOPY  2011   Dr Collene Mares   ESOPHAGOGASTRODUODENOSCOPY (EGD) WITH PROPOFOL N/A 07/19/2022   Procedure: ESOPHAGOGASTRODUODENOSCOPY (EGD) WITH PROPOFOL;  Surgeon: Lavena Bullion, DO;  Location: [facility];  Service: Gastroenterology;  Laterality: N/A;   UMBILICAL HERNIA REPAIR      Family History  Problem Relation Age of Onset   Arthritis Mother        died of complications from hip surgery   Arthritis Father    Kidney disease Father    Pneumonia Father    Heart disease Father    Lupus Daughter    Rheum arthritis Daughter     Sjogren's syndrome Daughter    Fibromyalgia Daughter    Diabetes Brother    Heart disease Brother    Heart attack Brother    Alcohol abuse Brother    Throat cancer Brother    Lung cancer Brother    Kidney disease Sister        dialysis   Obesity Sister    Hypertension Maternal Grandfather    Hypertension Sister    Kidney disease Sister        dialysis   Pneumonia Brother    Hypertension Daughter    Graves' disease Daughter    Hypertension Daughter    Alcohol abuse Other        parent   Ataxia Neg Hx    Chorea Neg Hx    Dementia Neg Hx    Mental retardation Neg Hx    Migraines Neg Hx    Multiple sclerosis Neg Hx    Neurofibromatosis Neg Hx    Neuropathy Neg Hx    Parkinsonism Neg Hx    Seizures Neg Hx    Stroke Neg Hx    Colon cancer Neg Hx    Colon polyps Neg Hx    Esophageal cancer Neg Hx    Rectal cancer Neg Hx    Stomach cancer Neg Hx     Social History   Socioeconomic History   Marital status: Widowed    Spouse name: Not on file   Number of children: 5   Years of education: Not on file   Highest education level: Not on file  Occupational History   Occupation: retired  Tobacco Use   Smoking status: Never   Smokeless tobacco: Never   Tobacco comments:    dtr 2 g-kids.  Vaping Use   Vaping Use: Never used  Substance and Sexual Activity   Alcohol use: No    Comment: rare   Drug use: No   Sexual activity: Not Currently  Other Topics Concern   Not on file  Social History Narrative   Right handed   One story with daughter and two grandkids   Social Determinants of Health   Financial Resource Strain: Low Risk  (06/04/2022)   Overall Financial Resource Strain (CARDIA)    Difficulty of Paying Living Expenses: Not hard at all  Food Insecurity: No Food Insecurity (06/04/2022)   Hunger Vital Sign    Worried About Running Out of Food in the Last Year: Never true    Ran Out of Food in the Last Year: Never true  Transportation Needs: No Transportation  Needs (06/04/2022)   PRAPARE - Hydrologist (Medical): No    Lack of Transportation (Non-Medical): No  Physical Activity: Insufficiently Active (06/04/2022)   Exercise Vital Sign    Days of Exercise per Week: 4 days    Minutes of Exercise per Session: 30  min  Stress: No Stress Concern Present (06/04/2022)   [place]    Feeling of Stress : Not at all  Social Connections: Moderately Isolated (06/04/2022)   Social Connection and Isolation Panel [NHANES]    Frequency of Communication with Friends and Family: More than three times a week    Frequency of Social Gatherings with Friends and Family: More than three times a week    Attends Religious Services: More than 4 times per year    Active Member of [employer] or Organizations: No    Attends Archivist Meetings: Never    Marital Status: Divorced  Human resources officer Violence: Not At Risk (06/04/2022)   Humiliation, Afraid, Rape, and Kick questionnaire    Fear of Current or Ex-Partner: No    Emotionally Abused: No    Physically Abused: No    Sexually Abused: No    Outpatient Medications Prior to Visit  Medication Sig Dispense Refill   albuterol (VENTOLIN HFA) 108 (90 Base) MCG/ACT inhaler INHALE 2 PUFFS BY MOUTH EVERY 6 HOURS AS NEEDED FOR WHEEZING FOR SHORTNESS OF BREATH 18 g 12   alendronate (FOSAMAX) 70 MG tablet Take 1 tablet (70 mg total) by mouth once a week. Take with a full glass of water on an empty stomach. 4 tablet 5   ALPRAZolam (XANAX) 0.5 MG tablet TAKE 1 TABLET BY MOUTH TWICE DAILY AS NEEDED FOR ANXIETY AND FOR SLEEP 20 tablet 1   aspirin EC 81 MG tablet Take 81 mg by mouth daily. Swallow whole.     azelastine (ASTELIN) 0.1 % nasal spray Place 2 sprays into both nostrils 2 (two) times daily as needed for rhinitis. 30 mL 5   azelastine (OPTIVAR) 0.05 % ophthalmic solution Place 1 drop into both eyes 2 (two) times daily as needed. 6  mL 5   b complex vitamins tablet Take 1 tablet by mouth daily.     Cholecalciferol (VITAMIN D) 50 MCG (2000 UT) tablet Take 2,000 Units by mouth daily.     EPINEPHrine 0.3 mg/0.3 mL IJ SOAJ injection Inject 0.3 mg into the muscle as needed for anaphylaxis. 2 each 1   esomeprazole (NEXIUM) 40 MG capsule Take 1 capsule (40 mg total) by mouth daily. 90 capsule 1   levocetirizine (XYZAL) 5 MG tablet TAKE 1 TABLET BY MOUTH ONCE DAILY IN THE EVENING 30 tablet 5   montelukast (SINGULAIR) 10 MG tablet Take 1 tablet (10 mg total) by mouth at bedtime. 30 tablet 5   Multiple Vitamin (MULTIVITAMIN) tablet Take 1 tablet by mouth daily.     Olopatadine HCl (PATADAY) 0.2 % SOLN Place 1 drop into both eyes 1 day or 1 dose. 2.5 mL 0   rosuvastatin (CRESTOR) 10 MG tablet Take 1 tablet (10 mg total) by mouth daily. 90 tablet 3   spironolactone (ALDACTONE) 25 MG tablet Take 1 tablet (25 mg total) by mouth daily. 90 tablet 2   valsartan (DIOVAN) 320 MG tablet Take 1 tablet (320 mg total) by mouth daily. 90 tablet 1   VENTOLIN HFA 108 (90 Base) MCG/ACT inhaler Inhale 2 puffs into the lungs every 4 (four) hours as needed for wheezing or shortness of breath. 18 g 1   furosemide (LASIX) 20 MG tablet Take 1 tablet (20 mg total) by mouth 3 (three) times a week. Monday, Wednesday and Friday. 90 tablet 3   No facility-administered medications prior to visit.    Allergies  Allergen Reactions  Codeine Nausea And Vomiting   Hydralazine Hcl Other (See Comments)    Pt reports causes dizziness   Lisinopril-Hydrochlorothiazide Hives    REACTION: lip swelling,facial swelling,rash.   Metoprolol Other (See Comments)    Does not feel well on it    Other Cough    Patient states she is allergic to Shrimp, egg whites, tomatoes and dairy products.  Says they make her feel unwell but do not cause hives, SOB or anaphylaxis.  She just avoids eating them because they can make her have a headache.  Strawberries, watermelon, peanuts,  and all shellfish.    environmental   Shrimp [Shellfish Allergy] Other (See Comments)    Was allergy tested and showed shrimp was one--has not reacted b/c avoided    Review of Systems  Constitutional:  Negative for chills and fever.  HENT:  Positive for congestion.   Respiratory:  Positive for cough and sputum production. Negative for shortness of breath.   Cardiovascular:  Negative for chest pain and palpitations.  Gastrointestinal:  Negative for abdominal pain, blood in stool, constipation, diarrhea, nausea and vomiting.  Genitourinary:  Negative for dysuria, frequency, hematuria and urgency.  Skin:           Neurological:  Positive for headaches.       Objective:    Physical Exam Constitutional:      General: She is not in acute distress.    Appearance: Normal appearance. She is normal weight. She is not ill-appearing.  HENT:     Head: Normocephalic and atraumatic.     Right Ear: External ear normal.     Left Ear: External ear normal.     Nose: Nose normal.     Mouth/Throat:     Mouth: Mucous membranes are moist.     Pharynx: Oropharynx is clear.  Eyes:     General:        Right eye: No discharge.        Left eye: No discharge.     Extraocular Movements: Extraocular movements intact.     Conjunctiva/sclera: Conjunctivae normal.     Pupils: Pupils are equal, round, and reactive to light.  Cardiovascular:     Rate and Rhythm: Normal rate and regular rhythm.     Pulses: Normal pulses.     Heart sounds: Normal heart sounds. No murmur heard.    No gallop.  Pulmonary:     Effort: Pulmonary effort is normal. No respiratory distress.     Breath sounds: Normal breath sounds. No wheezing or rales.  Abdominal:     General: Bowel sounds are normal.     Palpations: Abdomen is soft.     Tenderness: There is no abdominal tenderness. There is no guarding.  Musculoskeletal:        General: Normal range of motion.     Cervical back: Normal range of motion.     Right lower  leg: No edema.     Left lower leg: No edema.  Skin:    General: Skin is warm and dry.  Neurological:     Mental Status: She is alert and oriented to person, place, and time.  Psychiatric:        Mood and Affect: Mood normal.        Behavior: Behavior normal.        Judgment: Judgment normal.     BP 122/74 (BP Location: Right Arm, Patient Position: Sitting, Cuff Size: Normal)   Pulse 74   Temp (!) 97.5 F (  36.4 C) (Oral)   Resp 16   Ht '4\' 11"'$  (1.499 m)   Wt 238 lb 6.4 oz (108.1 kg)   SpO2 98%   BMI 48.15 kg/m  Wt Readings from Last 3 Encounters:  10/23/22 238 lb 6.4 oz (108.1 kg)  10/05/22 232 lb 14.4 oz (105.6 kg)  09/27/22 237 lb 12.8 oz (107.9 kg)    Diabetic Foot Exam - Simple   No data filed    Lab Results  Component Value Date   WBC 8.8 10/23/2022   HGB 12.5 10/23/2022   HCT 37.2 10/23/2022   PLT 231.0 10/23/2022   GLUCOSE 85 10/23/2022   CHOL 122 10/23/2022   TRIG 46.0 10/23/2022   HDL 42.40 10/23/2022   LDLCALC 70 10/23/2022   ALT 21 10/23/2022   AST 16 10/23/2022   NA 139 10/23/2022   K 3.9 10/23/2022   CL 103 10/23/2022   CREATININE 0.85 10/23/2022   BUN 23 10/23/2022   CO2 27 10/23/2022   TSH 1.82 10/23/2022   INR 1.06 01/10/2015   HGBA1C 5.7 10/23/2022    Lab Results  Component Value Date   TSH 1.82 10/23/2022   Lab Results  Component Value Date   WBC 8.8 10/23/2022   HGB 12.5 10/23/2022   HCT 37.2 10/23/2022   MCV 88.8 10/23/2022   PLT 231.0 10/23/2022   Lab Results  Component Value Date   NA 139 10/23/2022   K 3.9 10/23/2022   CO2 27 10/23/2022   GLUCOSE 85 10/23/2022   BUN 23 10/23/2022   CREATININE 0.85 10/23/2022   BILITOT 0.4 10/23/2022   ALKPHOS 72 10/23/2022   AST 16 10/23/2022   ALT 21 10/23/2022   PROT 6.6 10/23/2022   ALBUMIN 3.8 10/23/2022   CALCIUM 8.9 10/23/2022   ANIONGAP 9 09/15/2017   EGFR 58 (L) 08/13/2022   GFR 67.16 10/23/2022   Lab Results  Component Value Date   CHOL 122 10/23/2022   Lab  Results  Component Value Date   HDL 42.40 10/23/2022   Lab Results  Component Value Date   LDLCALC 70 10/23/2022   Lab Results  Component Value Date   TRIG 46.0 10/23/2022   Lab Results  Component Value Date   CHOLHDL 3 10/23/2022   Lab Results  Component Value Date   HGBA1C 5.7 10/23/2022      Assessment & Plan:  Immunizations: Reviewed patient's immunization history. Encouraged COVID-19, RSV, and Shingles immunizations and Tetanus immunization if injured.  Labs: Routine blood work completed today. Problem List Items Addressed This Visit     Allergies    Have been severe for her for years but are slowly improving after several years of allergy shots. She has struggled with some dysphagia and discomfort with swallowing so she is working with speech pathology      Asthma, mild intermittent - Primary   Relevant Orders   Hemoglobin A1c (Completed)   CBC with Differential/Platelet (Completed)   Comprehensive metabolic panel (Completed)   Lipid panel (Completed)   TSH (Completed)   Chronic cough   Essential hypertension    Well controlled, no changes to meds. Encouraged heart healthy diet such as the DASH diet and exercise as tolerated.       Hyperglycemia    hgba1c acceptable, minimize simple carbs. Increase exercise as tolerated.       Hyperlipidemia    Tolerating statin, encouraged heart healthy diet, avoid trans fats, minimize simple carbs and saturated fats. Increase exercise as tolerated. Is tolerating  Rosuvastatin      Morbid obesity ([place])    Encouraged DASH or MIND diet, decrease po intake and increase exercise as tolerated. Needs 7-8 hours of sleep nightly. Avoid trans fats, eat small, frequent meals every 4-5 hours with lean proteins, complex carbs and healthy fats. Minimize simple carbs, high fat foods and processed foods       Osteoporosis   Relevant Orders   VITAMIN D 25 Hydroxy (Vit-D Deficiency, Fractures) (Completed)   Vitamin D deficiency     Supplement and monitor      No orders of the defined types were placed in this encounter.  I, Penni Homans, MD, personally preformed the services described in this documentation.  All medical record entries made by the scribe were at my direction and in my presence.  I have reviewed the chart and discharge instructions (if applicable) and agree that the record reflects my personal performance and is accurate and complete. 10/23/2022  I,Mohammed Iqbal,acting as a scribe for Penni Homans, MD.,have documented all relevant documentation on the behalf of Penni Homans, MD,as directed by  Penni Homans, MD while in the presence of Penni Homans, MD.  Penni Homans, MD

## 2022-10-28 NOTE — Assessment & Plan Note (Signed)
hgba1c acceptable, minimize simple carbs. Increase exercise as tolerated.  

## 2022-10-28 NOTE — Assessment & Plan Note (Signed)
Supplement and monitor 

## 2022-10-28 NOTE — Assessment & Plan Note (Signed)
Tolerating statin, encouraged heart healthy diet, avoid trans fats, minimize simple carbs and saturated fats. Increase exercise as tolerated. Is tolerating Rosuvastatin

## 2022-10-28 NOTE — Assessment & Plan Note (Signed)
Well controlled, no changes to meds. Encouraged heart healthy diet such as the DASH diet and exercise as tolerated.  

## 2022-10-28 NOTE — Assessment & Plan Note (Signed)
Have been severe for her for years but are slowly improving after several years of allergy shots. She has struggled with some dysphagia and discomfort with swallowing so she is working with speech pathology

## 2022-10-29 NOTE — Progress Notes (Unsigned)
Office Visit    Patient Name: Anne Barnes Date of Encounter: 10/30/2022  PCP:  Mosie Lukes, MD   [facility]  Cardiologist:  Freada Bergeron, MD  Advanced Practice Provider:  Liliane Shi, PA-C Electrophysiologist:  None   HPI    Anne Barnes is a 77 y.o. 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  She was seen January 2021 for concern of chest pain and a coronary CTA was performed that showed mild nonobstructive CAD and also findings of peribronchovascular nodules in the medial aspect of the right lower lobe with ill-defined margins, likely infectious or inflammatory in etiology.  Repeat CT scan on Jan 26, 2020 showed that those resolved and he confirmed inflammatory etiology.  Started on Crestor 10 mg daily which she tolerates well.  Saw Dr. Meda Coffee on 08/04/2020 where she was mourning the loss of her husband of 65 years.  Had mild lower extremity edema but no orthopnea.  Spironolactone was increased to 25 mg daily.  Seen in the clinic 04/25/2021 by Richardson Dopp, PA where she was doing well.  Continue to have issues with allergies.  Seen in the clinic 10/26/2021 where she was having nonexertional chest pain that was worse by laying down or eating meals.  Symptoms thought to be GERD in nature.  Seen in the clinic 01/2022 she is doing well from a cardiovascular standpoint.  Was having myalgias and was suffering from reflux.  She was last seen by Dr. Johney Frame 07/27/2022 and was doing "lousy".  She stated that the Friday prior to her appointment she woke up with swollen legs.  Left leg swelling had decreased a little since but her right leg swelling remains same.  Right leg has been feeling sore lately.  She denied any recent periods of immobility or travel.  Has chronic dyspnea on  exertion that was unchanged.  She also reported a tingling and squeezing sensation in her chest occasionally.  During this, she is usually sitting rather than doing anything exertional.  Resolves on its own without any intervention.  Blood pressure at home normally around 981 systolic.  She states that every now and then she will get readings in the 120s which is low for her.  In these instances, she becomes lightheaded and dizzy.  She denied any palpitations or shortness of breath.  No headache, syncope, orthopnea, PND.  Has been compliant with medications and would prefer to minimize meds is much as possible.  Today, she tells me she has been suffering with allergies and congestion.  She is on her third year of injections and is told by her MD that she should feel better by the fall.  She has pain every now and then in her left arm that comes and goes.  She has a stick that she rubs on it that has ginger in it that helps.  She does not think that this pain is particularly cardiac because it comes and goes on its own.  It is nonexertional.  She states she has the same feeling in her knees and believes it to be arthritis.  She is not a fan of natural medications over prescription drugs.  She occasionally has trouble with an anxious feeling in her chest and stomach and has Xanax to take if she needs it.  However, she tries to use chamomile tea  and rarely takes her Xanax.  She tells me the swelling in her legs has gotten better since discontinuing amlodipine and starting on Diovan for her blood pressure.  She still takes Lasix 3 days a week.  We discussed elastic therapy in [place] and custom compression hose for her comfort.   Reports no chest pain, pressure, or tightness. No orthopnea, PND. Reports no palpitations.   Past Medical History    Past Medical History:  Diagnosis Date   Acute combined systolic and diastolic heart failure ([place]) 05/26/2015   ALLERGIC RHINITIS    Allergic urticaria 08/01/2014    Patient reports allergies to shrimp, egg whites, tomatoes, dairy    Allergy    seasonal and numerous food and drug allergies.   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   Office Spirometry 11/07/17-WNL-FVC 1.58/82%, FEV1 1.27/85%, ratio 0.80, FEF 25-75% 0.26/89%   Bradycardia 02/25/2017   Chiari malformation    Noted MRI brain 09/2013 - s/p neuro eval for same   DIVERTICULOSIS, COLON    Essential hypertension 08/28/2010   Qualifier: Diagnosis of  By: Marca Ancona RMA, Lucy      Frequent headaches    GERD    H/O measles    H/O mumps    Hearing loss 01/18/2017   History of chicken pox    Hyperglycemia 03/05/2016   HYPERTENSION    Hypocalcemia 02/25/2017   IBS (irritable bowel syndrome) 05/26/2015   Nonspecific abnormal electrocardiogram (ECG) (EKG)    OBSTRUCTIVE SLEEP APNEA 12/2008 dx   noncompliant with CPAP qhs   Obstructive sleep apnea 08/28/2010   NPSG 12/2008:  AHI 13/hr with desats to 78%    Preventative health care 06/11/2017   Sciatica    right side   Seasonal and perennial allergic rhinitis 08/28/2010   Allergy profile 03/11/14- positive especially for dust, cat and dog Food allergy profile- total IgE 86 with several food group elevations Sed rate-WNL      Sinusitis 06/06/2017   Sleep apnea    uses cpap   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   Vaginitis 01/18/2017   Vitamin D deficiency 06/11/2017   Past Surgical History:  Procedure Laterality Date   ABDOMINAL HYSTERECTOMY  1986   BIOPSY  07/19/2022   Procedure: BIOPSY;  Surgeon: Lavena Bullion, DO;  Location: [facility];  Service: Gastroenterology;;   [place]   Breast biopsy   COLONOSCOPY  2011   Dr Collene Mares   ESOPHAGOGASTRODUODENOSCOPY (EGD) WITH PROPOFOL N/A 07/19/2022   Procedure: ESOPHAGOGASTRODUODENOSCOPY (EGD) WITH PROPOFOL;  Surgeon: Lavena Bullion, DO;  Location: [facility];  Service: Gastroenterology;  Laterality: N/A;   UMBILICAL HERNIA REPAIR      Allergies  Allergies  Allergen Reactions    Codeine Nausea And Vomiting   Hydralazine Hcl Other (See Comments)    Pt reports causes dizziness   Lisinopril-Hydrochlorothiazide Hives    REACTION: lip swelling,facial swelling,rash.   Metoprolol Other (See Comments)    Does not feel well on it    Other Cough    Patient states she is allergic to Shrimp, egg whites, tomatoes and dairy products.  Says they make her feel unwell but do not cause hives, SOB or anaphylaxis.  She just avoids eating them because they can make her have a headache.  Strawberries, watermelon, peanuts, and all shellfish.    environmental   Shrimp [Shellfish Allergy] Other (See Comments)    Was allergy tested and showed shrimp was one--has not reacted b/c avoided     EKGs/Labs/Other Studies Reviewed:  The following studies were reviewed today:  Echo 03/31/2021:  1. Left ventricular ejection fraction, by estimation, is 60 to 65%. The  left ventricle has normal function. The left ventricle has no regional  wall motion abnormalities. Left ventricular diastolic parameters were  normal.   2. Right ventricular systolic function is normal. The right ventricular  size is normal.   3. The mitral valve is normal in structure. Trivial mitral valve  regurgitation. No evidence of mitral stenosis.   4. The aortic valve is tricuspid. Aortic valve regurgitation is not  visualized. No aortic stenosis is present.   5. The inferior vena cava is normal in size with greater than 50%  respiratory variability, suggesting right atrial pressure of 3 mmHg.   Cardiac monitor 12/2019: Sinus bradycardia to sinus tachycardia. Patient had a min HR of 39 bpm, max HR of 167 bpm, and avg HR of 57 bpm. Very short episodes of SVT, no pauses no atrial fibrillation.   Short episodes of SVT, no other significant arrhythmias.   Coronary CTA 11/20/19: FINDINGS: A 100 kV prospective scan was triggered in the descending thoracic aorta at 111 HU's. Axial non-contrast 3 mm slices were carried  out through the heart. The data set was analyzed on a dedicated work station and scored using the [place]. Gantry rotation speed was 250 msecs and collimation was .6 mm. No beta blockade and 0.8 mg of sl NTG was given. The 3D data set was reconstructed in 5% intervals of the 67-82 % of the R-R cycle. Diastolic phases were analyzed on a dedicated work station using MPR, MIP and VRT modes. The patient received 80 cc of contrast.   Aorta:  Normal size.  Mild diffuse calcifications.  No dissection.   Aortic Valve:  Trileaflet.  No calcifications.   Coronary Arteries:  Normal coronary origin.  Right dominance.   RCA is a large dominant artery that gives rise to PDA and PLA. There is significant motion, however there is only mild mixed plaque in the proximal and mid RCA with 25-49% plaque.   Left main is a large artery that gives rise to LAD and LCX arteries. Left main has no plaque.   LAD is a large vessel that has mild calcified plaque in the proximal and mid LAD with stenosis 0-25%. Distal LAD has no significant plaque.   D1 is a very small branch with no plaque.   LCX is a non-dominant artery that gives rise to two OM branches, there is minimal plaque.   Other findings:   Normal pulmonary vein drainage into the left atrium.   Normal left atrial appendage without a thrombus.   Normal size of the pulmonary artery.   IMPRESSION: 1. Coronary calcium score of 127. This was 69 percentile for age and sex matched control.   2. Normal coronary origin with right dominance.   3. CAD-RADS 2. Mild non-obstructive CAD (25-49%). Consider non-atherosclerotic causes of chest pain. Consider preventive therapy and risk factor modification.   TTE 2018: -------------------------------------------------------------------  Left ventricle:  The cavity size was normal. Systolic function was  normal. The estimated ejection fraction was in the range of 60% to  65%. Wall motion was  normal; there were no regional wall motion  abnormalities. There was no evidence of elevated ventricular  filling pressure by Doppler parameters.   -------------------------------------------------------------------  Aortic valve:   Trileaflet; normal thickness leaflets. Mobility was  not restricted.  Doppler:  Transvalvular velocity was within the  normal range. There was no  stenosis. There was no regurgitation.     -------------------------------------------------------------------  Aorta:  Aortic root: The aortic root was normal in size.   -------------------------------------------------------------------  Mitral valve:   Structurally normal valve.   Mobility was not  restricted.  Doppler:  Transvalvular velocity was within the normal  range. There was no evidence for stenosis. There was no  regurgitation.    Peak gradient (D): 4 mm Hg.   -------------------------------------------------------------------  Left atrium:  The atrium was normal in size.   -------------------------------------------------------------------  Right ventricle:  Poorly visualized. The cavity size was normal.  Wall thickness was normal. Systolic function was normal.   -------------------------------------------------------------------  Pulmonic valve:    Structurally normal valve.   Cusp separation was  normal.  Doppler:  Transvalvular velocity was within the normal  range. There was no evidence for stenosis. There was mild  regurgitation.   -------------------------------------------------------------------  Tricuspid valve:   Structurally normal valve.    Doppler:  Transvalvular velocity was within the normal range. There was no  regurgitation.   -------------------------------------------------------------------  Pulmonary artery:   The main pulmonary artery was normal-sized.  Systolic pressure was within the normal range.   -------------------------------------------------------------------   Right atrium:  The atrium was normal in size.   -------------------------------------------------------------------  Pericardium:  There was no pericardial effusion.   -------------------------------------------------------------------  Systemic veins:  Inferior vena cava: The vessel was normal in size.    Myoview 2018: The left ventricular ejection fraction is hyperdynamic (>65%). Nuclear stress EF: 66%. No T wave inversion was noted during stress. There was no ST segment deviation noted during stress. Defect 1: There is a small defect of mild severity.   Small size, mild intensity fixed apical lateral and inferolateral perfusion defect, likely attenuation artifact. No significant reversible ischemia. LVEF 66% with normal wall motion. This is a low risk study.     EKG:  EKG is  ordered today.  The ekg ordered today demonstrates NSR rate 64 bpm  Recent Labs: 07/27/2022: NT-Pro BNP 45 10/23/2022: ALT 21; BUN 23; Creatinine, Ser 0.85; Hemoglobin 12.5; Platelets 231.0; Potassium 3.9; Sodium 139; TSH 1.82  Recent Lipid Panel    Component Value Date/Time   CHOL 122 10/23/2022 1133   CHOL 116 02/17/2020 0904   TRIG 46.0 10/23/2022 1133   TRIG 55 12/30/2009 0000   HDL 42.40 10/23/2022 1133   HDL 42 02/17/2020 0904   CHOLHDL 3 10/23/2022 1133   VLDL 9.2 10/23/2022 1133   LDLCALC 70 10/23/2022 1133   LDLCALC 64 02/17/2020 0904   Home Medications   Current Meds  Medication Sig   albuterol (VENTOLIN HFA) 108 (90 Base) MCG/ACT inhaler INHALE 2 PUFFS BY MOUTH EVERY 6 HOURS AS NEEDED FOR WHEEZING FOR SHORTNESS OF BREATH   alendronate (FOSAMAX) 70 MG tablet Take 1 tablet (70 mg total) by mouth once a week. Take with a full glass of water on an empty stomach.   ALPRAZolam (XANAX) 0.5 MG tablet TAKE 1 TABLET BY MOUTH TWICE DAILY AS NEEDED FOR ANXIETY AND FOR SLEEP   aspirin EC 81 MG tablet Take 81 mg by mouth daily. Swallow whole.   azelastine (ASTELIN) 0.1 % nasal spray Place 2 sprays  into both nostrils 2 (two) times daily as needed for rhinitis.   azelastine (OPTIVAR) 0.05 % ophthalmic solution Place 1 drop into both eyes 2 (two) times daily as needed.   b complex vitamins tablet Take 1 tablet by mouth daily.   Cholecalciferol (VITAMIN D) 50 MCG (2000 UT) tablet Take 2,000  Units by mouth daily.   EPINEPHrine 0.3 mg/0.3 mL IJ SOAJ injection Inject 0.3 mg into the muscle as needed for anaphylaxis.   esomeprazole (NEXIUM) 40 MG capsule Take 1 capsule (40 mg total) by mouth daily.   furosemide (LASIX) 20 MG tablet Take 1 tablet (20 mg total) by mouth 3 (three) times a week. Monday, Wednesday and Friday.   levocetirizine (XYZAL) 5 MG tablet TAKE 1 TABLET BY MOUTH ONCE DAILY IN THE EVENING   montelukast (SINGULAIR) 10 MG tablet Take 1 tablet (10 mg total) by mouth at bedtime.   Multiple Vitamin (MULTIVITAMIN) tablet Take 1 tablet by mouth daily.   Olopatadine HCl (PATADAY) 0.2 % SOLN Place 1 drop into both eyes 1 day or 1 dose.   rosuvastatin (CRESTOR) 10 MG tablet Take 1 tablet (10 mg total) by mouth daily.   spironolactone (ALDACTONE) 25 MG tablet Take 1 tablet (25 mg total) by mouth daily.   valsartan (DIOVAN) 320 MG tablet Take 1 tablet (320 mg total) by mouth daily.     Review of Systems      All other systems reviewed and are otherwise negative except as noted above.  Physical Exam    VS:  BP (!) 142/86   Pulse 64   Ht '4\' 11"'$  (1.499 m)   Wt 237 lb (107.5 kg)   SpO2 97%   BMI 47.87 kg/m  , BMI Body mass index is 47.87 kg/m.  Wt Readings from Last 3 Encounters:  10/30/22 237 lb (107.5 kg)  10/23/22 238 lb 6.4 oz (108.1 kg)  10/05/22 232 lb 14.4 oz (105.6 kg)     GEN: Well nourished, well developed, in no acute distress. HEENT: normal. Neck: Supple, no JVD, carotid bruits, or masses. Cardiac: RRR, no murmurs, rubs, or gallops. No clubbing, cyanosis, 1 + pitting edema bilaterally.  Radials/PT 2+ and equal bilaterally.  Respiratory:  Respirations regular and  unlabored, clear to auscultation bilaterally. GI: Soft, nontender, nondistended. MS: No deformity or atrophy. Skin: Warm and dry, no rash. Neuro:  Strength and sensation are intact. Psych: Normal affect.  Plan:    Bilateral leg edema -right one especially swells -better with current BP regimen -continue lasix and spirolactone -discussed elastic therapy in [place]  SOB -chronic and multifactorial -we will update an echo since its been a while -continue current medications  CAD -No chest pain but + left arm pain -atypical for cardiac -continue GDMT: Aspirin 81 mg daily, Lasix 20 mg 3 days a week, Crestor 10 mg daily, spironolactone 25 mg daily, Diovan 320 mg daily  Morbid obesity -encouraged daily exercise  Essential hypertension -in the 120s and 130s at home -continue Diovan '320mg'$  daily  HFpEF -update echo -overall euvolemic on exam with small amount of LE edema  Hyperlipidemia -LDL 70 (09/2022) -continue Crestor, at goal     Disposition: Follow up 6 months with Freada Bergeron, MD or APP.  Signed, Elgie Collard, PA-C 10/30/2022, 12:06 PM  [facility]

## 2022-10-30 ENCOUNTER — Encounter: Payer: Self-pay | Admitting: Physician Assistant

## 2022-10-30 ENCOUNTER — Ambulatory Visit: Payer: Medicare HMO | Attending: Physician Assistant | Admitting: Physician Assistant

## 2022-10-30 VITALS — BP 142/86 | HR 64 | Ht 59.0 in | Wt 237.0 lb

## 2022-10-30 DIAGNOSIS — K219 Gastro-esophageal reflux disease without esophagitis: Secondary | ICD-10-CM

## 2022-10-30 DIAGNOSIS — I10 Essential (primary) hypertension: Secondary | ICD-10-CM

## 2022-10-30 DIAGNOSIS — I2510 Atherosclerotic heart disease of native coronary artery without angina pectoris: Secondary | ICD-10-CM

## 2022-10-30 DIAGNOSIS — R0602 Shortness of breath: Secondary | ICD-10-CM

## 2022-10-30 DIAGNOSIS — E785 Hyperlipidemia, unspecified: Secondary | ICD-10-CM

## 2022-10-30 DIAGNOSIS — I5030 Unspecified diastolic (congestive) heart failure: Secondary | ICD-10-CM

## 2022-10-30 DIAGNOSIS — R600 Localized edema: Secondary | ICD-10-CM | POA: Diagnosis not present

## 2022-10-30 NOTE — Patient Instructions (Signed)
Medication Instructions:  Your physician recommends that you continue on your current medications as directed. Please refer to the Current Medication list given to you today.  *If you need a refill on your cardiac medications before your next appointment, please call your pharmacy*   Lab Work: None ordered If you have labs (blood work) drawn today and your tests are completely normal, you will receive your results only by: [place] (if you have MyChart) OR A paper copy in the mail If you have any lab test that is abnormal or we need to change your treatment, we will call you to review the results.   Testing/Procedures: Your physician has requested that you have an echocardiogram within the next 2 weeks. Echocardiography is a painless test that uses sound waves to create images of your heart. It provides your doctor with information about the size and shape of your heart and how well your heart's chambers and valves are working. This procedure takes approximately one hour. There are no restrictions for this procedure. Please do NOT wear cologne, perfume, aftershave, or lotions (deodorant is allowed). Please arrive 15 minutes prior to your appointment time.    Follow-Up: At [facility], you and your health needs are our priority.  As part of our continuing mission to provide you with exceptional heart care, we have created designated Provider Care Teams.  These Care Teams include your primary Cardiologist (physician) and Advanced Practice Providers (APPs -  Physician Assistants and Nurse Practitioners) who all work together to provide you with the care you need, when you need it.   Your next appointment:   6 month(s)  Provider:   Freada Bergeron, MD

## 2022-11-05 ENCOUNTER — Ambulatory Visit: Payer: Medicare HMO | Attending: Family Medicine

## 2022-11-05 ENCOUNTER — Ambulatory Visit (HOSPITAL_COMMUNITY): Payer: Medicare HMO | Attending: Physician Assistant

## 2022-11-05 DIAGNOSIS — I5030 Unspecified diastolic (congestive) heart failure: Secondary | ICD-10-CM | POA: Diagnosis not present

## 2022-11-05 DIAGNOSIS — R1313 Dysphagia, pharyngeal phase: Secondary | ICD-10-CM | POA: Diagnosis not present

## 2022-11-05 LAB — ECHOCARDIOGRAM COMPLETE
Area-P 1/2: 4.64 cm2
S' Lateral: 2.1 cm
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
## 2022-11-12 ENCOUNTER — Ambulatory Visit: Payer: Medicare HMO | Admitting: Podiatry

## 2022-11-12 DIAGNOSIS — M79674 Pain in right toe(s): Secondary | ICD-10-CM

## 2022-11-12 DIAGNOSIS — B351 Tinea unguium: Secondary | ICD-10-CM | POA: Diagnosis not present

## 2022-11-12 DIAGNOSIS — M79675 Pain in left toe(s): Secondary | ICD-10-CM

## 2022-11-12 NOTE — Progress Notes (Unsigned)
Subjective: Chief Complaint  Patient presents with   Nail Problem    Thick painful toenails, 3 month follow up    76 year old female presents the office today with above concerns.  She has chronic swelling to her legs and she wears compression socks.  She feels that there is a band around her right ankle but this is better with compression socks.  Also her nails are thickened elongated she has difficulty trimming them.  No open lesions.  PCP: Mosie Lukes, MD-last seen 10/23/2022 A1c: 5.7 on 10/23/2022  Objective: AAO 3, NAD DP/PT pulses palpable, CRT less than 3 seconds Chronic appearing pitting edema present bilaterally.  There is no erythema or warmth. Protective sensation intact with Derrel Nip monofilament Nails hypertrophic, dystrophic, elongated, brittle, discolored 10. There is tenderness overlying the nails 1-5 bilaterally. There is no surrounding erythema or drainage along the nail sites. No open lesions or pre-ulcerative lesions are identified. No pain with calf compression, swelling, warmth, erythema.  Assessment: Patient presents with symptomatic onychomycosis; and, capsulitis  Plan: -Treatment options including alternatives, risks, complications were discussed -Nails sharply debrided 10 without complication/bleeding. -Continue supportive shoe gear.  -Continue compression, elevation.   -Discussed daily foot inspection. If there are any changes, to call the office immediately.  -Follow-up in 3 months or sooner if any problems are to arise. In the meantime, encouraged to call the office with any questions, concerns, changes symptoms.  Celesta Gentile, DPM

## 2022-11-21 ENCOUNTER — Encounter: Payer: Self-pay | Admitting: Allergy

## 2022-11-21 ENCOUNTER — Ambulatory Visit: Payer: Medicare HMO | Admitting: Allergy

## 2022-11-21 ENCOUNTER — Other Ambulatory Visit: Payer: Self-pay

## 2022-11-21 VITALS — BP 160/80 | HR 65 | Temp 98.5°F | Resp 16

## 2022-11-21 DIAGNOSIS — Z91018 Allergy to other foods: Secondary | ICD-10-CM | POA: Diagnosis not present

## 2022-11-21 DIAGNOSIS — J0140 Acute pansinusitis, unspecified: Secondary | ICD-10-CM

## 2022-11-21 DIAGNOSIS — J3089 Other allergic rhinitis: Secondary | ICD-10-CM

## 2022-11-21 DIAGNOSIS — J302 Other seasonal allergic rhinitis: Secondary | ICD-10-CM

## 2022-11-21 DIAGNOSIS — J4520 Mild intermittent asthma, uncomplicated: Secondary | ICD-10-CM | POA: Diagnosis not present

## 2022-11-21 MED ORDER — PREDNISONE 20 MG PO TABS: 20.0000 mg | ORAL_TABLET | Freq: Every day | ORAL | 0 refills | Status: AC

## 2022-11-21 MED ORDER — AMOXICILLIN-POT CLAVULANATE 875-125 MG PO TABS: 1.0000 | ORAL_TABLET | Freq: Two times a day (BID) | ORAL | 0 refills | Status: AC

## 2022-11-21 NOTE — Progress Notes (Signed)
Follow-up Note  RE: Anne Barnes MRN: [id number] DOB: 12/16/1945 Date of Office Visit: 11/21/2022   History of present illness: Anne Barnes is a 77 y.o. female presenting today for sick visit.  She has history of allergic rhinitis, asthma and food allergy.  She was last seen in the office on 10/05/22 by myself.   She states over the last 2 weeks she has had sinus pressure and pain across her eyes and forehead as well as headaches and increased mucus drainage.  She states she is getting dizzy (room spinning).  She also reports she is coughing.  Denies fevers. She has had occasional chills.  She states the sinus pressure is getting worse.  She states she has had decreased appetite and has lost about 5 lbs in the past 2 weeks due to it.  She also states she has had poor sleep due to sinus pressure and pain.  She state she has not had a sinus infection before thus does not know what it feels like.  She had negative at home Covid testing on Thursday last week.  She continues her usual allergy medications including xyzal, singulair and Astelin.  She has albuterol inhaler for as needed use but has not needed it with above symptoms.  She continues her current food avoidance and states she tries her best to avoid gluten but that it is in everything.    Review of systems: Review of Systems  Constitutional:  Positive for appetite change, chills and fatigue.  HENT:  Positive for congestion, rhinorrhea, sinus pressure and sinus pain.   Eyes: Negative.   Respiratory: Negative.    Cardiovascular: Negative.   Gastrointestinal: Negative.   Musculoskeletal: Negative.   Skin: Negative.   Allergic/Immunologic: Negative.   Neurological:  Positive for dizziness and headaches.     All other systems negative unless noted above in HPI  Past medical/social/surgical/family history have been reviewed and are unchanged unless specifically indicated below.  No changes  Medication List: Current  Outpatient Medications  Medication Sig Dispense Refill   albuterol (VENTOLIN HFA) 108 (90 Base) MCG/ACT inhaler INHALE 2 PUFFS BY MOUTH EVERY 6 HOURS AS NEEDED FOR WHEEZING FOR SHORTNESS OF BREATH 18 g 12   alendronate (FOSAMAX) 70 MG tablet Take 1 tablet (70 mg total) by mouth once a week. Take with a full glass of water on an empty stomach. 4 tablet 5   ALPRAZolam (XANAX) 0.5 MG tablet TAKE 1 TABLET BY MOUTH TWICE DAILY AS NEEDED FOR ANXIETY AND FOR SLEEP 20 tablet 1   amoxicillin-clavulanate (AUGMENTIN) 875-125 MG tablet Take 1 tablet by mouth 2 (two) times daily for 10 days. 20 tablet 0   aspirin EC 81 MG tablet Take 81 mg by mouth daily. Swallow whole.     azelastine (ASTELIN) 0.1 % nasal spray Place 2 sprays into both nostrils 2 (two) times daily as needed for rhinitis. 30 mL 5   azelastine (OPTIVAR) 0.05 % ophthalmic solution Place 1 drop into both eyes 2 (two) times daily as needed. 6 mL 5   b complex vitamins tablet Take 1 tablet by mouth daily.     Cholecalciferol (VITAMIN D) 50 MCG (2000 UT) tablet Take 2,000 Units by mouth daily.     EPINEPHrine 0.3 mg/0.3 mL IJ SOAJ injection Inject 0.3 mg into the muscle as needed for anaphylaxis. 2 each 1   esomeprazole (NEXIUM) 40 MG capsule Take 1 capsule (40 mg total) by mouth daily. 90 capsule 1   furosemide (  LASIX) 20 MG tablet Take 1 tablet (20 mg total) by mouth 3 (three) times a week. Monday, Wednesday and Friday. 90 tablet 3   levocetirizine (XYZAL) 5 MG tablet TAKE 1 TABLET BY MOUTH ONCE DAILY IN THE EVENING 30 tablet 5   montelukast (SINGULAIR) 10 MG tablet Take 1 tablet (10 mg total) by mouth at bedtime. 30 tablet 5   Multiple Vitamin (MULTIVITAMIN) tablet Take 1 tablet by mouth daily.     Olopatadine HCl (PATADAY) 0.2 % SOLN Place 1 drop into both eyes 1 day or 1 dose. 2.5 mL 0   predniSONE (DELTASONE) 20 MG tablet Take 1 tablet (20 mg total) by mouth daily with supper for 5 days. 5 tablet 0   rosuvastatin (CRESTOR) 10 MG tablet Take 1  tablet (10 mg total) by mouth daily. 90 tablet 3   spironolactone (ALDACTONE) 25 MG tablet Take 1 tablet (25 mg total) by mouth daily. 90 tablet 2   valsartan (DIOVAN) 320 MG tablet Take 1 tablet (320 mg total) by mouth daily. 90 tablet 1   VENTOLIN HFA 108 (90 Base) MCG/ACT inhaler Inhale 2 puffs into the lungs every 4 (four) hours as needed for wheezing or shortness of breath. 18 g 1   No current facility-administered medications for this visit.     Known medication allergies: Allergies  Allergen Reactions   Codeine Nausea And Vomiting   Hydralazine Hcl Other (See Comments)    Pt reports causes dizziness   Lisinopril-Hydrochlorothiazide Hives    REACTION: lip swelling,facial swelling,rash.   Metoprolol Other (See Comments)    Does not feel well on it    Other Cough    Patient states she is allergic to Shrimp, egg whites, tomatoes and dairy products.  Says they make her feel unwell but do not cause hives, SOB or anaphylaxis.  She just avoids eating them because they can make her have a headache.  Strawberries, watermelon, peanuts, and all shellfish.    environmental   Shrimp [Shellfish Allergy] Other (See Comments)    Was allergy tested and showed shrimp was one--has not reacted b/c avoided     Physical examination: Blood pressure (!) 160/80, pulse 65, temperature 98.5 F (36.9 C), temperature source Temporal, resp. rate 16, SpO2 99 %.  General: Alert, interactive, in no acute distress. HEENT: PERRLA, TMs pearly gray, turbinates moderately edematous with thick discharge, post-pharynx non erythematous. Neck: Supple without lymphadenopathy. Lungs: Clear to auscultation without wheezing, rhonchi or rales. {no increased work of breathing. CV: Normal S1, S2 without murmurs. Abdomen: Nondistended, nontender. Skin: Warm and dry, without lesions or rashes. Extremities:  No clubbing, cyanosis or edema. Neuro:   Grossly intact.  Diagnositics/Labs: None today  Assessment and  plan:  Acute sinus infection - take Augmentin '875mg'$  1 tab twice a day for 10 days.  This is antibiotic for sinus infection - take Prednisone '20mg'$  1 tab daily for next 5 days.  This will help with the sinus pain and pressure.  - get adequate rest and stay well hydrated - resume your allergy shots when you are feeling better next week  Allergic rhinitis - continue avoidance measures for dust mites, cat and dog.   - continue Xyzal '5mg'$  daily - continue Singulair '10mg'$  daily - for nasal drainage and post-nasal drip continue use of nasal antihistamine, Astelin 2 sprays each nostril 2 times a day.   - recommend performing nasal saline rinses prior to use of medicated nasal sprays to help flush out the sinuses.  Use  with distilled water at room temperature.  Breathe in and out through your mouth during the entire process. - use Optivar 1 drop each eye up to twice a day as needed for itchy/watery/red eyes.  - continue allergy injections per schedule.   Food allergy - continue food avoidance of shellfish, gluten, soybean, peanut  - skin testing to these foods were negative at initial visit.   - have access to your epinephrine device in case of allergic reaction.   - follow emergency action plan in case of allergic reaction   Mild intermittent asthma - have access to albuterol inhaler 2 puffs every 4-6 hours as needed for cough/wheeze/shortness of breath/chest tightness.  May use 15-20 minutes prior to activity.   Monitor frequency of use.    Follow-up after October 2024 or sooner if needed  I appreciate the opportunity to take part in [place] care. Please do not hesitate to contact me with questions.  Sincerely,   Prudy Feeler, MD Allergy/Immunology [facility]

## 2022-11-21 NOTE — Patient Instructions (Addendum)
-   take Augmentin 879m 1 tab twice a day for 10 days.  This is antibiotic for sinus infection - take Prednisone 290m1 tab daily for next 5 days.  This will help with the sinus pain and pressure.  - get adequate rest and stay well hydrated - resume your allergy shots when you are feeling better next week  - continue avoidance measures for dust mites, cat and dog.   - continue Xyzal 25m22maily - continue Singulair 75m75mily - for nasal drainage and post-nasal drip continue use of nasal antihistamine, Astelin 2 sprays each nostril 2 times a day.   - recommend performing nasal saline rinses prior to use of medicated nasal sprays to help flush out the sinuses.  Use with distilled water at room temperature.  Breathe in and out through your mouth during the entire process. - use Optivar 1 drop each eye up to twice a day as needed for itchy/watery/red eyes.  - continue allergy injections per schedule.   - continue food avoidance of shellfish, gluten, soybean, peanut  - skin testing to these foods were negative at initial visit.   - have access to your epinephrine device in case of allergic reaction.   - follow emergency action plan in case of allergic reaction   - have access to albuterol inhaler 2 puffs every 4-6 hours as needed for cough/wheeze/shortness of breath/chest tightness.  May use 15-20 minutes prior to activity.   Monitor frequency of use.    Follow-up 9 months (after October 2024) or sooner if needed

## 2022-11-27 ENCOUNTER — Ambulatory Visit (INDEPENDENT_AMBULATORY_CARE_PROVIDER_SITE_OTHER): Payer: Medicare HMO

## 2022-11-27 DIAGNOSIS — J309 Allergic rhinitis, unspecified: Secondary | ICD-10-CM

## 2022-12-27 ENCOUNTER — Ambulatory Visit (INDEPENDENT_AMBULATORY_CARE_PROVIDER_SITE_OTHER): Payer: Medicare HMO

## 2022-12-27 DIAGNOSIS — J309 Allergic rhinitis, unspecified: Secondary | ICD-10-CM

## 2023-01-21 ENCOUNTER — Other Ambulatory Visit: Payer: Self-pay | Admitting: *Deleted

## 2023-01-21 ENCOUNTER — Other Ambulatory Visit: Payer: Self-pay | Admitting: Family Medicine

## 2023-01-21 DIAGNOSIS — Z79899 Other long term (current) drug therapy: Secondary | ICD-10-CM

## 2023-01-21 DIAGNOSIS — I2510 Atherosclerotic heart disease of native coronary artery without angina pectoris: Secondary | ICD-10-CM

## 2023-01-21 DIAGNOSIS — R600 Localized edema: Secondary | ICD-10-CM

## 2023-01-21 DIAGNOSIS — I5032 Chronic diastolic (congestive) heart failure: Secondary | ICD-10-CM

## 2023-01-21 DIAGNOSIS — I10 Essential (primary) hypertension: Secondary | ICD-10-CM

## 2023-01-21 MED ORDER — VALSARTAN 320 MG PO TABS: 320.0000 mg | ORAL_TABLET | Freq: Every day | ORAL | 1 refills | Status: DC

## 2023-01-21 NOTE — Telephone Encounter (Signed)
Requesting: alprazolam 0.5mg  Contract: None UDS: None Last Visit: 10/23/22 Next Visit: 03/05/23 Last Refill: 02/16/22 #20 and 1RF   Please Advise

## 2023-01-22 DIAGNOSIS — H5213 Myopia, bilateral: Secondary | ICD-10-CM | POA: Diagnosis not present

## 2023-01-23 ENCOUNTER — Other Ambulatory Visit: Payer: Self-pay

## 2023-01-23 DIAGNOSIS — I2510 Atherosclerotic heart disease of native coronary artery without angina pectoris: Secondary | ICD-10-CM

## 2023-01-23 DIAGNOSIS — I5032 Chronic diastolic (congestive) heart failure: Secondary | ICD-10-CM

## 2023-01-23 DIAGNOSIS — I10 Essential (primary) hypertension: Secondary | ICD-10-CM

## 2023-01-23 DIAGNOSIS — R600 Localized edema: Secondary | ICD-10-CM

## 2023-01-23 DIAGNOSIS — Z79899 Other long term (current) drug therapy: Secondary | ICD-10-CM

## 2023-01-23 MED ORDER — VALSARTAN 320 MG PO TABS: 320.0000 mg | ORAL_TABLET | Freq: Every day | ORAL | 3 refills | Status: DC

## 2023-01-28 NOTE — Patient Instructions (Incomplete)
Perennial allergic rhinitis -Since you are feeling a little better today we will hold off any any antibiotics/steroids - Start Flonase (fluticasone) 1-2 sprays in each nostril once a day as needed for stuffy nose. In the right nostril, point the applicator out toward the right ear. In the left nostril, point the applicator out toward the left ear - continue avoidance measures for dust mites, cat and dog.   - continue Xyzal 5mg  daily - continue Singulair 10mg  daily - for nasal drainage and post-nasal drip continue use of nasal antihistamine, Astelin 2 sprays each nostril 2 times a day.   - recommend performing nasal saline rinses prior to use of medicated nasal sprays to help flush out the sinuses.  Use with distilled water at room temperature.  Breathe in and out through your mouth during the entire process. - use Optivar 1 drop each eye up to twice a day as needed for itchy/watery/red eyes.  - continue allergy injections per schedule.   Food allergy - continue food avoidance of shellfish, gluten, soybean, peanut  - skin testing to these foods were negative at initial visit.   - have access to your epinephrine device in case of allergic reaction.   - follow emergency action plan in case of allergic reaction   Mild intermittent asthma - have access to albuterol inhaler 2 puffs every 4-6 hours as needed for cough/wheeze/shortness of breath/chest tightness.  May use 15-20 minutes prior to activity.   Monitor frequency of use.    Follow-up 4-6 months or sooner if needed

## 2023-01-29 ENCOUNTER — Encounter: Payer: Self-pay | Admitting: Family

## 2023-01-29 ENCOUNTER — Other Ambulatory Visit: Payer: Self-pay

## 2023-01-29 ENCOUNTER — Ambulatory Visit: Payer: Medicare PPO | Admitting: Family

## 2023-01-29 VITALS — BP 128/68 | HR 82 | Temp 98.0°F | Resp 18 | Ht 59.0 in | Wt 234.1 lb

## 2023-01-29 DIAGNOSIS — J4520 Mild intermittent asthma, uncomplicated: Secondary | ICD-10-CM | POA: Diagnosis not present

## 2023-01-29 DIAGNOSIS — J302 Other seasonal allergic rhinitis: Secondary | ICD-10-CM | POA: Diagnosis not present

## 2023-01-29 DIAGNOSIS — Z91018 Allergy to other foods: Secondary | ICD-10-CM

## 2023-01-29 DIAGNOSIS — J309 Allergic rhinitis, unspecified: Secondary | ICD-10-CM | POA: Diagnosis not present

## 2023-01-29 MED ORDER — FLUTICASONE PROPIONATE 50 MCG/ACT NA SUSP: NASAL | 5 refills | Status: DC

## 2023-01-29 NOTE — Progress Notes (Signed)
[address] [place] [address] Dept: [phone]  FOLLOW UP NOTE  Patient ID: Anne Barnes, female    DOB: [birth year]  Age: 77 y.o. MRN: [id number] Date of Office Visit: 01/29/2023  Assessment  Chief Complaint: Follow-up (Runny Nose; Head ache; Cough x 1 wk)  HPI Anne Barnes is a 77 year old female who presents today for an acute visit of possible sinus infection.  She was last seen on November 21, 2022 by Dr. Delorse Lek for acute sinus infection, allergic rhinitis, food allergy, and mild intermittent asthma.  She denies any new diagnosis or surgery since her last office visit.  Allergic rhinitis: She reports for 1 week she has had sinus pressure and headache that comes and goes.  When she has this it will also make her feel lightheaded.  She will feel the pressure in her forehead and nose region.  She also reports clear rhinorrhea that runs right out,  and a lot of mucus that causes her to cough.  If she lays dormant the cough does not bother her.  She also reports very seldom nasal congestion.  She denies fever, body aches, sore throat, and known sick contact.  She reports that she is actually feeling a little better today.  She is currently taking Xyzal 5 mg once a day, Singulair 10 mg once a day, and Astelin nasal spray twice a day.  She continues to receive allergy injections per protocol and is hoping that she can get 1 this week.  At times she feels like her allergy injections have helped and times not.  Allergic conjunctivitis: She reports itchy eyes at night.  She does have eyedrops that she does not know the name of and these help.  She has 2 eyedrops, but only uses one of them.  Allergy: She continues to avoid shellfish, soybean, and peanut without any accidental ingestion.  She tries her best to avoid gluten, but every now and then she does have some, because it is hard to stay away from.  Since her last office visit she has not had to use her epinephrine autoinjector device  and reports that she knows how to use it.  Mild intermittent asthma: She reports coughing due to the mucus in her throat.  She denies wheezing, tightness in her chest, and shortness of breath.  She uses her albuterol sporadically.  When she uses her albuterol she uses it to clear her throat.  She mentions that it loosens it so it can be brought up.  Reviewed when to  use albuterol.   Drug Allergies:  Allergies  Allergen Reactions   Codeine Nausea And Vomiting   Hydralazine Hcl Other (See Comments)    Pt reports causes dizziness   Lisinopril-Hydrochlorothiazide Hives    REACTION: lip swelling,facial swelling,rash.   Metoprolol Other (See Comments)    Does not feel well on it    Other Cough    Patient states she is allergic to Shrimp, egg whites, tomatoes and dairy products.  Says they make her feel unwell but do not cause hives, SOB or anaphylaxis.  She just avoids eating them because they can make her have a headache.  Strawberries, watermelon, peanuts, and all shellfish.    environmental   Shrimp [Shellfish Allergy] Other (See Comments)    Was allergy tested and showed shrimp was one--has not reacted b/c avoided    Review of Systems: Review of Systems  Constitutional:  Positive for chills. Negative for fever.  Reports chills, but mentions that her cardiologist said to expect this due to her CHF and blockage.  She does deny fevers and bodyaches.  HENT:         Reports clear rhinorrhea sinus pressure/headache that comes and goes, postnasal drip, and seldom nasal congestion.  Denies sore throat  Eyes:        Reports itchy eyes at night for which she has eyedrops that help  Respiratory:  Positive for cough. Negative for shortness of breath and wheezing.        Reports occasional cough due to a lot of mucus.  Denies wheezing, tightness in chest, and shortness of breath.  Cardiovascular:  Negative for chest pain and palpitations.  Gastrointestinal:        Reports reflux every now  and then.  Takes Nexium daily  Skin:  Negative for itching and rash.  Neurological:  Positive for dizziness and headaches.       Reports lightheadedness when she has the sinus pressure/headaches  Endo/Heme/Allergies:  Positive for environmental allergies.     Physical Exam: BP 128/68 (BP Location: Right Arm, Patient Position: Sitting, Cuff Size: Large)   Pulse 82   Temp 98 F (36.7 C) (Temporal)   Resp 18   Ht 4\' 11"  (1.499 m)   Wt 234 lb 1.6 oz (106.2 kg)   SpO2 96%   BMI 47.28 kg/m    Physical Exam Constitutional:      Appearance: Normal appearance.  HENT:     Head: Normocephalic and atraumatic.     Comments: Pharynx normal, eyes normal, ears: Unable to see bilateral tympanic membrane due to cerumen.  Nose: Bilateral lower turbinates mildly edematous and slightly erythematous with no drainage noted    Right Ear: Ear canal and external ear normal.     Left Ear: Ear canal and external ear normal.     Mouth/Throat:     Mouth: Mucous membranes are moist.     Pharynx: Oropharynx is clear.  Eyes:     Conjunctiva/sclera: Conjunctivae normal.  Cardiovascular:     Rate and Rhythm: Regular rhythm.     Heart sounds: Normal heart sounds.  Pulmonary:     Effort: Pulmonary effort is normal.     Breath sounds: Normal breath sounds.     Comments: Lungs clear to auscultation Musculoskeletal:     Cervical back: Neck supple.  Skin:    General: Skin is warm.  Neurological:     Mental Status: She is alert and oriented to person, place, and time.  Psychiatric:        Mood and Affect: Mood normal.        Behavior: Behavior normal.        Thought Content: Thought content normal.        Judgment: Judgment normal.     Diagnostics:  None  Assessment and Plan: 1. Seasonal and perennial allergic rhinitis   2. Food allergy   3. Mild intermittent asthma, uncomplicated   4. Allergic rhinitis, unspecified seasonality, unspecified trigger     Meds ordered this encounter  Medications    fluticasone (FLONASE) 50 MCG/ACT nasal spray    Sig: Use 1-2 sprays in each nostril once  a day as needed for stuffy nose. In the right nostril, point the applicator out toward the right ear. In the left nostril, point the applicator out toward the left ear    Dispense:  16 g    Refill:  5    Patient Instructions  Perennial  allergic rhinitis -Since you are feeling a little better today we will hold off any any antibiotics/steroids - Start Flonase (fluticasone) 1-2 sprays in each nostril once a day as needed for stuffy nose. In the right nostril, point the applicator out toward the right ear. In the left nostril, point the applicator out toward the left ear - continue avoidance measures for dust mites, cat and dog.   - continue Xyzal 5mg  daily - continue Singulair 10mg  daily - for nasal drainage and post-nasal drip continue use of nasal antihistamine, Astelin 2 sprays each nostril 2 times a day.   - recommend performing nasal saline rinses prior to use of medicated nasal sprays to help flush out the sinuses.  Use with distilled water at room temperature.  Breathe in and out through your mouth during the entire process. - use Optivar 1 drop each eye up to twice a day as needed for itchy/watery/red eyes.  - continue allergy injections per schedule.   Food allergy - continue food avoidance of shellfish, gluten, soybean, peanut  - skin testing to these foods were negative at initial visit.   - have access to your epinephrine device in case of allergic reaction.   - follow emergency action plan in case of allergic reaction   Mild intermittent asthma - have access to albuterol inhaler 2 puffs every 4-6 hours as needed for cough/wheeze/shortness of breath/chest tightness.  May use 15-20 minutes prior to activity.   Monitor frequency of use.    Follow-up 4-6 months or sooner if needed  Return in about 4 months (around 06/01/2023), or if symptoms worsen or fail to improve.    Thank you for the  opportunity to care for this patient.  Please do not hesitate to contact me with questions.  Nehemiah Settle, FNP [facility]

## 2023-01-31 ENCOUNTER — Other Ambulatory Visit: Payer: Self-pay

## 2023-01-31 MED ORDER — ALENDRONATE SODIUM 70 MG PO TABS: 70.0000 mg | ORAL_TABLET | ORAL | 5 refills | Status: DC

## 2023-02-08 ENCOUNTER — Other Ambulatory Visit: Payer: Self-pay | Admitting: Allergy & Immunology

## 2023-02-08 DIAGNOSIS — J3089 Other allergic rhinitis: Secondary | ICD-10-CM

## 2023-02-11 ENCOUNTER — Ambulatory Visit (INDEPENDENT_AMBULATORY_CARE_PROVIDER_SITE_OTHER): Payer: Medicare PPO | Admitting: Podiatry

## 2023-02-11 DIAGNOSIS — B351 Tinea unguium: Secondary | ICD-10-CM | POA: Diagnosis not present

## 2023-02-11 DIAGNOSIS — M79675 Pain in left toe(s): Secondary | ICD-10-CM

## 2023-02-11 DIAGNOSIS — M79674 Pain in right toe(s): Secondary | ICD-10-CM | POA: Diagnosis not present

## 2023-02-11 NOTE — Progress Notes (Signed)
Subjective: Chief Complaint  Patient presents with   Nail Problem    Nail trim      77 year old female presents the office today with above concerns.  She denies any open lesions.  She has no injuries or concerns otherwise.  PCP: Bradd Canary, MD-last seen 10/23/2022 A1c: 5.7 on 10/23/2022  Objective: AAO 3, NAD DP/PT pulses palpable, CRT less than 3 seconds Chronic appearing pitting edema present bilaterally.  There is no erythema or warmth.  No significant worsening. Nails hypertrophic, dystrophic, elongated, brittle, discolored 10. There is tenderness overlying the nails 1-5 bilaterally. There is no surrounding erythema or drainage along the nail sites. No open lesions or pre-ulcerative lesions are identified. No pain with calf compression, swelling, warmth, erythema.  Assessment: Patient presents with symptomatic onychomycosis; and, capsulitis  Plan: -Treatment options including alternatives, risks, complications were discussed -Nails sharply debrided 10 without complication/bleeding.n.   -Discussed daily foot inspection. If there are any changes, to call the office immediately.  -Follow-up in 3 months or sooner if any problems are to arise. In the meantime, encouraged to call the office with any questions, concerns, changes symptoms.  Ovid Curd, DPM

## 2023-02-15 ENCOUNTER — Other Ambulatory Visit: Payer: Self-pay | Admitting: Allergy & Immunology

## 2023-02-15 DIAGNOSIS — J3089 Other allergic rhinitis: Secondary | ICD-10-CM

## 2023-02-21 ENCOUNTER — Ambulatory Visit (INDEPENDENT_AMBULATORY_CARE_PROVIDER_SITE_OTHER): Payer: Medicare PPO

## 2023-02-21 DIAGNOSIS — J309 Allergic rhinitis, unspecified: Secondary | ICD-10-CM

## 2023-02-26 DIAGNOSIS — J3081 Allergic rhinitis due to animal (cat) (dog) hair and dander: Secondary | ICD-10-CM | POA: Diagnosis not present

## 2023-02-26 NOTE — Progress Notes (Signed)
VIAL EXP 02-26-24 

## 2023-03-05 ENCOUNTER — Ambulatory Visit (INDEPENDENT_AMBULATORY_CARE_PROVIDER_SITE_OTHER): Payer: Medicare PPO | Admitting: Family Medicine

## 2023-03-05 VITALS — BP 124/72 | HR 72 | Temp 98.0°F | Resp 16 | Ht 59.0 in | Wt 231.0 lb

## 2023-03-05 DIAGNOSIS — Z0000 Encounter for general adult medical examination without abnormal findings: Secondary | ICD-10-CM | POA: Diagnosis not present

## 2023-03-05 DIAGNOSIS — L500 Allergic urticaria: Secondary | ICD-10-CM | POA: Diagnosis not present

## 2023-03-05 DIAGNOSIS — R739 Hyperglycemia, unspecified: Secondary | ICD-10-CM | POA: Diagnosis not present

## 2023-03-05 DIAGNOSIS — J4520 Mild intermittent asthma, uncomplicated: Secondary | ICD-10-CM

## 2023-03-05 DIAGNOSIS — E559 Vitamin D deficiency, unspecified: Secondary | ICD-10-CM

## 2023-03-05 DIAGNOSIS — K5730 Diverticulosis of large intestine without perforation or abscess without bleeding: Secondary | ICD-10-CM

## 2023-03-05 DIAGNOSIS — G4733 Obstructive sleep apnea (adult) (pediatric): Secondary | ICD-10-CM | POA: Diagnosis not present

## 2023-03-05 DIAGNOSIS — I10 Essential (primary) hypertension: Secondary | ICD-10-CM

## 2023-03-05 NOTE — Progress Notes (Signed)
Subjective:   By signing my name below, I, Vickey Sages, attest that this documentation has been prepared under the direction and in the presence of Bradd Canary, MD., 03/05/2023.   Patient ID: Anne Barnes, female    DOB: 04-12-1946, 77 y.o.   MRN: [id number]  No chief complaint on file.  HPI Patient is in today for a comprehensive physical exam and follow up on chronic medical concerns. She denies recent hospitalization, febrile illness, CP/palpitations/SOB/HA/fever/chills/or GU symptoms.  Chronic Constipation Patient reports that she has been experiencing chronic constipation since young adulthood. She continues using Milk of Magnesia as needed which has been helpful. She denies nausea, vomiting, diarrhea, abdominal pain, and blood in stool.  Immunizations Patient is considering the RSV vaccination but chooses not to receive it at this time.  Sleep Apnea Patient reports that she sleeps approximately 4 hours nightly and constantly feels fatigued. Her last sleep study was completed in 11/2017 with pulmonologist Dr. Jetty Duhamel and she received a CPAP. She continues to follow with Dr. Maple Hudson and continues to use the CPAP which helps her to fall asleep. Additionally, she reports that she experiences lightheadedness and weakness daily. This worsens when walking and she is having to use an assisted walking device. She follows with her allergist who has recommended that she tilts her head backwards when using Flonase so that the medicine flows to her ears. However, this does not relieve the pressure in her head and worsens her lightheadedness. Today she denies any allergy related symptoms.  Past Medical History:  Diagnosis Date   Acute combined systolic and diastolic heart failure (HCC) 05/26/2015   ALLERGIC RHINITIS    Allergic urticaria 08/01/2014   Patient reports allergies to shrimp, egg whites, tomatoes, dairy    Allergy    seasonal and numerous food and drug allergies.    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   Office Spirometry 11/07/17-WNL-FVC 1.58/82%, FEV1 1.27/85%, ratio 0.80, FEF 25-75% 0.26/89%   Bradycardia 02/25/2017   Chiari malformation    Noted MRI brain 09/2013 - s/p neuro eval for same   DIVERTICULOSIS, COLON    Essential hypertension 08/28/2010   Qualifier: Diagnosis of  By: Charlsie Quest RMA, Lucy      Frequent headaches    GERD    H/O measles    H/O mumps    Hearing loss 01/18/2017   History of chicken pox    Hyperglycemia 03/05/2016   HYPERTENSION    Hypocalcemia 02/25/2017   IBS (irritable bowel syndrome) 05/26/2015   Nonspecific abnormal electrocardiogram (ECG) (EKG)    OBSTRUCTIVE SLEEP APNEA 12/2008 dx   noncompliant with CPAP qhs   Obstructive sleep apnea 08/28/2010   NPSG 12/2008:  AHI 13/hr with desats to 78%    Preventative health care 06/11/2017   Sciatica    right side   Seasonal and perennial allergic rhinitis 08/28/2010   Allergy profile 03/11/14- positive especially for dust, cat and dog Food allergy profile- total IgE 86 with several food group elevations Sed rate-WNL      Sinusitis 06/06/2017   Sleep apnea    uses cpap   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   Vaginitis 01/18/2017   Vitamin D deficiency 06/11/2017    Past Surgical History:  Procedure Laterality Date   ABDOMINAL HYSTERECTOMY  1986   BIOPSY  07/19/2022   Procedure: BIOPSY;  Surgeon: Shellia Cleverly, DO;  Location: [facility];  Service: Gastroenterology;;   BREAST SURGERY  1973   Breast biopsy   COLONOSCOPY  2011   Dr Loreta Ave   ESOPHAGOGASTRODUODENOSCOPY (EGD) WITH PROPOFOL N/A 07/19/2022   Procedure: ESOPHAGOGASTRODUODENOSCOPY (EGD) WITH PROPOFOL;  Surgeon: Shellia Cleverly, DO;  Location: [facility];  Service: Gastroenterology;  Laterality: N/A;   UMBILICAL HERNIA REPAIR      Family History  Problem Relation Age of Onset   Arthritis Mother        died of complications from hip surgery   Arthritis Father    Kidney disease Father    Pneumonia  Father    Heart disease Father    Lupus Daughter    Rheum arthritis Daughter    Sjogren's syndrome Daughter    Fibromyalgia Daughter    Diabetes Brother    Heart disease Brother    Heart attack Brother    Alcohol abuse Brother    Throat cancer Brother    Lung cancer Brother    Kidney disease Sister        dialysis   Obesity Sister    Hypertension Maternal Grandfather    Hypertension Sister    Kidney disease Sister        dialysis   Pneumonia Brother    Hypertension Daughter    Graves' disease Daughter    Hypertension Daughter    Alcohol abuse Other        parent   Ataxia Neg Hx    Chorea Neg Hx    Dementia Neg Hx    Mental retardation Neg Hx    Migraines Neg Hx    Multiple sclerosis Neg Hx    Neurofibromatosis Neg Hx    Neuropathy Neg Hx    Parkinsonism Neg Hx    Seizures Neg Hx    Stroke Neg Hx    Colon cancer Neg Hx    Colon polyps Neg Hx    Esophageal cancer Neg Hx    Rectal cancer Neg Hx    Stomach cancer Neg Hx     Social History   Socioeconomic History   Marital status: Widowed    Spouse name: Not on file   Number of children: 5   Years of education: Not on file   Highest education level: Not on file  Occupational History   Occupation: retired  Tobacco Use   Smoking status: Never   Smokeless tobacco: Never   Tobacco comments:    dtr 2 g-kids.  Vaping Use   Vaping Use: Never used  Substance and Sexual Activity   Alcohol use: No    Comment: rare   Drug use: No   Sexual activity: Not Currently  Other Topics Concern   Not on file  Social History Narrative   Right handed   One story with daughter and two grandkids   Social Determinants of Health   Financial Resource Strain: Low Risk  (06/04/2022)   Overall Financial Resource Strain (CARDIA)    Difficulty of Paying Living Expenses: Not hard at all  Food Insecurity: No Food Insecurity (06/04/2022)   Hunger Vital Sign    Worried About Running Out of Food in the Last Year: Never true    Ran  Out of Food in the Last Year: Never true  Transportation Needs: No Transportation Needs (06/04/2022)   PRAPARE - Administrator, Civil Service (Medical): No    Lack of Transportation (Non-Medical): No  Physical Activity: Insufficiently Active (06/04/2022)   Exercise Vital Sign    Days of Exercise per Week: 4 days    Minutes of Exercise per Session: 30 min  Stress: No Stress Concern Present (06/04/2022)   [employer] of Occupational Health - Occupational Stress Questionnaire    Feeling of Stress : Not at all  Social Connections: Moderately Isolated (06/04/2022)   Social Connection and Isolation Panel [NHANES]    Frequency of Communication with Friends and Family: More than three times a week    Frequency of Social Gatherings with Friends and Family: More than three times a week    Attends Religious Services: More than 4 times per year    Active Member of [employer] or Organizations: No    Attends Banker Meetings: Never    Marital Status: Divorced  Catering manager Violence: Not At Risk (06/04/2022)   Humiliation, Afraid, Rape, and Kick questionnaire    Fear of Current or Ex-Partner: No    Emotionally Abused: No    Physically Abused: No    Sexually Abused: No    Outpatient Medications Prior to Visit  Medication Sig Dispense Refill   albuterol (VENTOLIN HFA) 108 (90 Base) MCG/ACT inhaler INHALE 2 PUFFS BY MOUTH EVERY 6 HOURS AS NEEDED FOR WHEEZING FOR SHORTNESS OF BREATH 18 g 12   alendronate (FOSAMAX) 70 MG tablet Take 1 tablet (70 mg total) by mouth once a week. Take with a full glass of water on an empty stomach. 4 tablet 5   ALPRAZolam (XANAX) 0.5 MG tablet TAKE 1 TABLET BY MOUTH TWICE DAILY AS NEEDED FOR ANXIETY AND FOR SLEEP 20 tablet 0   aspirin EC 81 MG tablet Take 81 mg by mouth daily. Swallow whole.     azelastine (ASTELIN) 0.1 % nasal spray Place 2 sprays into both nostrils 2 (two) times daily as needed for rhinitis. 30 mL 5   azelastine (OPTIVAR) 0.05  % ophthalmic solution Place 1 drop into both eyes 2 (two) times daily as needed. 6 mL 5   b complex vitamins tablet Take 1 tablet by mouth daily.     Cholecalciferol (VITAMIN D) 50 MCG (2000 UT) tablet Take 2,000 Units by mouth daily.     EPINEPHrine 0.3 mg/0.3 mL IJ SOAJ injection Inject 0.3 mg into the muscle as needed for anaphylaxis. 2 each 1   esomeprazole (NEXIUM) 40 MG capsule Take 1 capsule (40 mg total) by mouth daily. 90 capsule 1   fluticasone (FLONASE) 50 MCG/ACT nasal spray Use 1-2 sprays in each nostril once  a day as needed for stuffy nose. In the right nostril, point the applicator out toward the right ear. In the left nostril, point the applicator out toward the left ear 16 g 5   furosemide (LASIX) 20 MG tablet Take 1 tablet (20 mg total) by mouth 3 (three) times a week. Monday, Wednesday and Friday. 90 tablet 3   levocetirizine (XYZAL) 5 MG tablet TAKE 1 TABLET BY MOUTH ONCE DAILY IN THE EVENING 30 tablet 5   montelukast (SINGULAIR) 10 MG tablet Take 1 tablet (10 mg total) by mouth at bedtime. 30 tablet 5   Multiple Vitamin (MULTIVITAMIN) tablet Take 1 tablet by mouth daily.     Olopatadine HCl (PATADAY) 0.2 % SOLN Place 1 drop into both eyes 1 day or 1 dose. 2.5 mL 0   rosuvastatin (CRESTOR) 10 MG tablet Take 1 tablet (10 mg total) by mouth daily. 90 tablet 3   spironolactone (ALDACTONE) 25 MG tablet Take 1 tablet (25 mg total) by mouth daily. 90 tablet 2   valsartan (DIOVAN) 320 MG tablet Take 1 tablet (320 mg total) by mouth daily. 90 tablet  3   VENTOLIN HFA 108 (90 Base) MCG/ACT inhaler Inhale 2 puffs into the lungs every 4 (four) hours as needed for wheezing or shortness of breath. 18 g 1   No facility-administered medications prior to visit.    Allergies  Allergen Reactions   Codeine Nausea And Vomiting   Hydralazine Hcl Other (See Comments)    Pt reports causes dizziness   Lisinopril-Hydrochlorothiazide Hives    REACTION: lip swelling,facial swelling,rash.    Metoprolol Other (See Comments)    Does not feel well on it    Other Cough    Patient states she is allergic to Shrimp, egg whites, tomatoes and dairy products.  Says they make her feel unwell but do not cause hives, SOB or anaphylaxis.  She just avoids eating them because they can make her have a headache.  Strawberries, watermelon, peanuts, and all shellfish.    environmental   Shrimp [Shellfish Allergy] Other (See Comments)    Was allergy tested and showed shrimp was one--has not reacted b/c avoided    Review of Systems  Constitutional:  Negative for chills and fever.  HENT:  Negative for congestion, sinus pain and sore throat.   Respiratory:  Negative for cough, sputum production, shortness of breath and wheezing.   Cardiovascular:  Negative for chest pain and palpitations.  Gastrointestinal:  Positive for constipation. Negative for abdominal pain, blood in stool, diarrhea, nausea and vomiting.  Genitourinary:  Negative for dysuria, frequency, hematuria and urgency.  Skin:           Neurological:  Negative for headaches.       Objective:    Physical Exam Constitutional:      General: She is not in acute distress.    Appearance: Normal appearance. She is obese. She is not ill-appearing.  HENT:     Head: Normocephalic and atraumatic.     Right Ear: Tympanic membrane, ear canal and external ear normal.     Left Ear: Tympanic membrane, ear canal and external ear normal.     Nose: Nose normal.     Mouth/Throat:     Mouth: Mucous membranes are moist.     Pharynx: Oropharynx is clear.  Eyes:     General:        Right eye: No discharge.        Left eye: No discharge.     Extraocular Movements: Extraocular movements intact.     Right eye: No nystagmus.     Left eye: No nystagmus.     Pupils: Pupils are equal, round, and reactive to light.  Neck:     Vascular: No carotid bruit.  Cardiovascular:     Rate and Rhythm: Normal rate and regular rhythm.     Pulses: Normal pulses.      Heart sounds: Normal heart sounds. No murmur heard.    No gallop.  Pulmonary:     Effort: Pulmonary effort is normal. No respiratory distress.     Breath sounds: Normal breath sounds. No wheezing or rales.  Abdominal:     General: Bowel sounds are normal.     Palpations: Abdomen is soft.     Tenderness: There is no abdominal tenderness. There is no guarding.  Musculoskeletal:        General: Normal range of motion.     Cervical back: Normal range of motion.     Right lower leg: No edema.     Left lower leg: No edema.     Comments: Muscle strength  5/5 on upper and lower extremities.   Lymphadenopathy:     Cervical: No cervical adenopathy.  Skin:    General: Skin is warm and dry.  Neurological:     Mental Status: She is alert and oriented to person, place, and time.     Sensory: Sensation is intact.     Motor: Motor function is intact.     Coordination: Coordination is intact.     Deep Tendon Reflexes:     Reflex Scores:      Patellar reflexes are 2+ on the right side and 2+ on the left side. Psychiatric:        Mood and Affect: Mood normal.        Behavior: Behavior normal.        Judgment: Judgment normal.     There were no vitals taken for this visit. Wt Readings from Last 3 Encounters:  01/29/23 234 lb 1.6 oz (106.2 kg)  10/30/22 237 lb (107.5 kg)  10/23/22 238 lb 6.4 oz (108.1 kg)    Diabetic Foot Exam - Simple   No data filed    Lab Results  Component Value Date   WBC 8.8 10/23/2022   HGB 12.5 10/23/2022   HCT 37.2 10/23/2022   PLT 231.0 10/23/2022   GLUCOSE 85 10/23/2022   CHOL 122 10/23/2022   TRIG 46.0 10/23/2022   HDL 42.40 10/23/2022   LDLCALC 70 10/23/2022   ALT 21 10/23/2022   AST 16 10/23/2022   NA 139 10/23/2022   K 3.9 10/23/2022   CL 103 10/23/2022   CREATININE 0.85 10/23/2022   BUN 23 10/23/2022   CO2 27 10/23/2022   TSH 1.82 10/23/2022   INR 1.06 01/10/2015   HGBA1C 5.7 10/23/2022    Lab Results  Component Value Date   TSH  1.82 10/23/2022   Lab Results  Component Value Date   WBC 8.8 10/23/2022   HGB 12.5 10/23/2022   HCT 37.2 10/23/2022   MCV 88.8 10/23/2022   PLT 231.0 10/23/2022   Lab Results  Component Value Date   NA 139 10/23/2022   K 3.9 10/23/2022   CO2 27 10/23/2022   GLUCOSE 85 10/23/2022   BUN 23 10/23/2022   CREATININE 0.85 10/23/2022   BILITOT 0.4 10/23/2022   ALKPHOS 72 10/23/2022   AST 16 10/23/2022   ALT 21 10/23/2022   PROT 6.6 10/23/2022   ALBUMIN 3.8 10/23/2022   CALCIUM 8.9 10/23/2022   ANIONGAP 9 09/15/2017   EGFR 58 (L) 08/13/2022   GFR 67.16 10/23/2022   Lab Results  Component Value Date   CHOL 122 10/23/2022   Lab Results  Component Value Date   HDL 42.40 10/23/2022   Lab Results  Component Value Date   LDLCALC 70 10/23/2022   Lab Results  Component Value Date   TRIG 46.0 10/23/2022   Lab Results  Component Value Date   CHOLHDL 3 10/23/2022   Lab Results  Component Value Date   HGBA1C 5.7 10/23/2022      Assessment & Plan:  Colonoscopy: Last completed virtually on 01/16/2021. Impression:  - No polyp, mass, or stricture. - Aortic atherosclerosis. Repeat colonoscopy recommended in 7 years for surveillance, but patient will follow physician prior to making appointment due to age.  DEXA: Last completed on 06/08/2021. The BMD measured at Femur Neck Left is 0.690 g/cm2 with a T-score of -2.5. This patient is considered osteoporotic according to World Health Organization [facility]) criteria. Repeat DEXA recommended in 2-5 years.  Mammogram:  Last completed on 11/22/2015 with no mammographic evidence of malignancy. Repeat mammogram recommended in 1-2 years.  Pap Smear: Last completed on 01/18/2017 with normal results. Repeat pap smear is not recommended due to patient's age.  Advanced Directives: Encouraged patient to complete advanced care planning documents.  Healthy Lifestyle: Encouraged 6-8 hours of sleep, heart healthy diet, 60-80 oz of  non-alcohol/non-caffeinated fluids, and 4000-8000 steps daily.  Immunizations: Encouraged patient to consider annual COVID-19 and Influenza vaccinations as well as RSV and Shingles vaccinations. She declines all offered vaccinations at this time.  Labs: Routine blood work ordered.  Chronic Constipation: This is controlled with Milk of Magnesia as needed.  Sleep Apnea: This is monitored and managed with pulmonologist Dr. Jetty Duhamel. She continues to use CPAP. Problem List Items Addressed This Visit   None  No orders of the defined types were placed in this encounter.  I, Vickey Sages, personally preformed the services described in this documentation.  All medical record entries made by the scribe were at my direction and in my presence.  I have reviewed the chart and discharge instructions (if applicable) and agree that the record reflects my personal performance and is accurate and complete. 03/05/2023  I,Mohammed Iqbal,acting as a scribe for Danise Edge, MD.,have documented all relevant documentation on the behalf of Danise Edge, MD,as directed by  Danise Edge, MD while in the presence of Danise Edge, MD.  Vickey Sages

## 2023-03-05 NOTE — Patient Instructions (Addendum)
Respiratory Syncitial Virus at pharmacy, FSV constipation, Adult Constipation is when a person has fewer than three bowel movements in a week, has difficulty having a bowel movement, or has stools (feces) that are dry, hard, or larger than normal. Constipation may be caused by an underlying condition. It may become worse with age if a person takes certain medicines and does not take in enough fluids. Follow these instructions at home: Eating and drinking  Eat foods that have a lot of fiber, such as beans, whole grains, and fresh fruits and vegetables. Limit foods that are low in fiber and high in fat and processed sugars, such as fried or sweet foods. These include french fries, hamburgers, cookies, candies, and soda. Drink enough fluid to keep your urine pale yellow. General instructions Exercise regularly or as told by your health care provider. Try to do 150 minutes of moderate exercise each week. Use the bathroom when you have the urge to go. Do not hold it in. Take over-the-counter and prescription medicines only as told by your health care provider. This includes any fiber supplements. During bowel movements: Practice deep breathing while relaxing the lower abdomen. Practice pelvic floor relaxation. Watch your condition for any changes. Let your health care provider know about them. Keep all follow-up visits as told by your health care provider. This is important. Contact a health care provider if: You have pain that gets worse. You have a fever. You do not have a bowel movement after 4 days. You vomit. You are not hungry or you lose weight. You are bleeding from the opening between the buttocks (anus). You have thin, pencil-like stools. Get help right away if: You have a fever and your symptoms suddenly get worse. You leak stool or have blood in your stool. Your abdomen is bloated. You have severe pain in your abdomen. You feel dizzy or you faint. Summary Constipation is when  a person has fewer than three bowel movements in a week, has difficulty having a bowel movement, or has stools (feces) that are dry, hard, or larger than normal. Eat foods that have a lot of fiber, such as beans, whole grains, and fresh fruits and vegetables. Drink enough fluid to keep your urine pale yellow. Take over-the-counter and prescription medicines only as told by your health care provider. This includes any fiber supplements. This information is not intended to replace advice given to you by your health care provider. Make sure you discuss any questions you have with your health care provider. Document Revised: 07/25/2022 Document Reviewed: 07/25/2022 Elsevier Patient Education  2024 [employer].

## 2023-03-06 ENCOUNTER — Encounter: Payer: Self-pay | Admitting: Family Medicine

## 2023-03-06 LAB — COMPREHENSIVE METABOLIC PANEL
ALT: 20 U/L (ref 0–35)
AST: 19 U/L (ref 0–37)
Albumin: 4.1 g/dL (ref 3.5–5.2)
Alkaline Phosphatase: 69 U/L (ref 39–117)
BUN: 22 mg/dL (ref 6–23)
CO2: 28 mEq/L (ref 19–32)
Calcium: 8.9 mg/dL (ref 8.4–10.5)
Chloride: 103 mEq/L (ref 96–112)
Creatinine, Ser: 1.17 mg/dL (ref 0.40–1.20)
GFR: 45.65 mL/min — ABNORMAL LOW (ref >60.00)
Glucose, Bld: 82 mg/dL (ref 70–99)
Potassium: 4.6 mEq/L (ref 3.5–5.1)
Sodium: 139 mEq/L (ref 135–145)
Total Bilirubin: 0.4 mg/dL (ref 0.2–1.2)
Total Protein: 7.2 g/dL (ref 6.0–8.3)

## 2023-03-06 LAB — TSH: TSH: 1.06 u[IU]/mL (ref 0.35–5.50)

## 2023-03-06 LAB — HEMOGLOBIN A1C: Hgb A1c MFr Bld: 5.6 % (ref 4.6–6.5)

## 2023-03-06 LAB — CBC WITH DIFFERENTIAL/PLATELET
Basophils Absolute: 0.1 10*3/uL (ref 0.0–0.1)
Basophils Relative: 0.8 % (ref 0.0–3.0)
Eosinophils Absolute: 0.3 10*3/uL (ref 0.0–0.7)
Eosinophils Relative: 3.4 % (ref 0.0–5.0)
HCT: 39.4 % (ref 36.0–46.0)
Hemoglobin: 12.7 g/dL (ref 12.0–15.0)
Lymphocytes Relative: 15.7 % (ref 12.0–46.0)
Lymphs Abs: 1.6 10*3/uL (ref 0.7–4.0)
MCHC: 32.2 g/dL (ref 30.0–36.0)
MCV: 91.7 fl (ref 78.0–100.0)
Monocytes Absolute: 0.7 10*3/uL (ref 0.1–1.0)
Monocytes Relative: 7.5 % (ref 3.0–12.0)
Neutro Abs: 7.3 10*3/uL (ref 1.4–7.7)
Neutrophils Relative %: 72.6 % (ref 43.0–77.0)
Platelets: 259 10*3/uL (ref 150.0–400.0)
RBC: 4.3 Mil/uL (ref 3.87–5.11)
RDW: 14.2 % (ref 11.5–15.5)
WBC: 10.1 10*3/uL (ref 4.0–10.5)

## 2023-03-06 LAB — LIPID PANEL
Cholesterol: 111 mg/dL (ref 0–200)
HDL: 41.6 mg/dL (ref >39.00)
LDL Cholesterol: 60 mg/dL (ref 0–99)
NonHDL: 69.27
Total CHOL/HDL Ratio: 3
Triglycerides: 44 mg/dL (ref 0.0–149.0)
VLDL: 8.8 mg/dL (ref 0.0–40.0)

## 2023-03-06 NOTE — Assessment & Plan Note (Signed)
Encouraged increased hydration and fiber in diet. Daily probiotics. If bowels not moving can use MOM 2 tbls po in 4 oz of warm prune juice by mouth every 2-3 days. If no results then repeat in 4 hours with  Dulcolax suppository simultaneously.

## 2023-03-06 NOTE — Assessment & Plan Note (Signed)
Using CPAP but not sleeping well. It has been 5 years since last sleep study so she will consider returning to pulmonology for reevaluation

## 2023-03-06 NOTE — Assessment & Plan Note (Signed)
Patient encouraged to maintain heart healthy diet, regular exercise, adequate sleep. Consider daily probiotics. Take medications as prescribed. Labs reviewed with patient, immunizations reviewed. Encouraged MGM declines for nwo will get one before the end of year. Colonoscopy in 2022 they will consider repeat scope in 2029 depending on patient health, due to advanced age may not repeat. Patient has aged out of and declines MGM, paps. Dexa was 2022 repeat in 1 to 2 years.

## 2023-03-07 ENCOUNTER — Telehealth: Payer: Self-pay

## 2023-03-07 DIAGNOSIS — R350 Frequency of micturition: Secondary | ICD-10-CM

## 2023-03-07 NOTE — Telephone Encounter (Signed)
Spoke w/ Pt- she states she has been having urinary urgency- if she doesn't make it to the bathroom right away she will urinate on herself. She states that cardiology said that her heart condition can cause frequent urination and wanted to see if PCP would call her in something that will keep her from "wetting herself."

## 2023-03-08 NOTE — Telephone Encounter (Signed)
Anne Canary, MD  You; Cousar, [place], [place] hours ago (9:55 PM)    Have her do a UA and culture for urinary frequency and if that comes back normal we can try oxybutynin

## 2023-03-08 NOTE — Telephone Encounter (Signed)
Orders placed. Tried calling Pt- no answer, voicemail box has not been set up.

## 2023-03-11 ENCOUNTER — Other Ambulatory Visit: Payer: Self-pay

## 2023-03-11 DIAGNOSIS — R350 Frequency of micturition: Secondary | ICD-10-CM

## 2023-03-11 NOTE — Telephone Encounter (Signed)
Called pt lab appt made and labs ordered

## 2023-03-12 ENCOUNTER — Other Ambulatory Visit (INDEPENDENT_AMBULATORY_CARE_PROVIDER_SITE_OTHER): Payer: Medicare PPO

## 2023-03-12 ENCOUNTER — Ambulatory Visit (INDEPENDENT_AMBULATORY_CARE_PROVIDER_SITE_OTHER): Payer: Medicare PPO | Admitting: *Deleted

## 2023-03-12 DIAGNOSIS — R350 Frequency of micturition: Secondary | ICD-10-CM | POA: Diagnosis not present

## 2023-03-12 DIAGNOSIS — J309 Allergic rhinitis, unspecified: Secondary | ICD-10-CM | POA: Diagnosis not present

## 2023-03-12 LAB — URINALYSIS
Bilirubin Urine: NEGATIVE
Hgb urine dipstick: NEGATIVE
Ketones, ur: NEGATIVE
Leukocytes,Ua: NEGATIVE
Nitrite: NEGATIVE
Specific Gravity, Urine: 1.015 (ref 1.000–1.030)
Total Protein, Urine: NEGATIVE
Urine Glucose: NEGATIVE
Urobilinogen, UA: 0.2 (ref 0.0–1.0)
pH: 6 (ref 5.0–8.0)

## 2023-03-13 LAB — URINE CULTURE
MICRO NUMBER:: 15096463
SPECIMEN QUALITY:: ADEQUATE

## 2023-03-18 ENCOUNTER — Other Ambulatory Visit: Payer: Self-pay

## 2023-03-18 MED ORDER — OXYBUTYNIN CHLORIDE ER 10 MG PO TB24: 10.0000 mg | ORAL_TABLET | Freq: Every day | ORAL | 2 refills | Status: DC

## 2023-04-05 ENCOUNTER — Ambulatory Visit (INDEPENDENT_AMBULATORY_CARE_PROVIDER_SITE_OTHER): Payer: Medicare PPO

## 2023-04-05 DIAGNOSIS — J309 Allergic rhinitis, unspecified: Secondary | ICD-10-CM | POA: Diagnosis not present

## 2023-04-12 ENCOUNTER — Ambulatory Visit (INDEPENDENT_AMBULATORY_CARE_PROVIDER_SITE_OTHER): Payer: Medicare PPO | Admitting: *Deleted

## 2023-04-12 DIAGNOSIS — J309 Allergic rhinitis, unspecified: Secondary | ICD-10-CM

## 2023-04-21 ENCOUNTER — Other Ambulatory Visit: Payer: Self-pay | Admitting: Gastroenterology

## 2023-04-22 ENCOUNTER — Ambulatory Visit: Payer: Medicare PPO | Admitting: Podiatry

## 2023-04-22 DIAGNOSIS — L309 Dermatitis, unspecified: Secondary | ICD-10-CM

## 2023-04-22 DIAGNOSIS — M722 Plantar fascial fibromatosis: Secondary | ICD-10-CM | POA: Diagnosis not present

## 2023-04-22 MED ORDER — CLOTRIMAZOLE-BETAMETHASONE 1-0.05 % EX CREA: 1.0000 | TOPICAL_CREAM | Freq: Every day | CUTANEOUS | 0 refills | Status: DC

## 2023-04-23 NOTE — Progress Notes (Signed)
Subjective:   Patient ID: Anne Barnes, female   DOB: 77 y.o.   MRN: [id number]   HPI Patient presents stating that she has had some moisture between her digits and was concerned as having pain in her right heel that is very sore at the insertion   ROS      Objective:  Physical Exam  Neuro vascular status intact with irritation between the lesser digits bilateral with right leg discoloration and mild increase in fluid.  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

## 2023-05-01 ENCOUNTER — Ambulatory Visit: Payer: Medicare HMO | Admitting: Cardiology

## 2023-05-01 ENCOUNTER — Ambulatory Visit: Payer: Medicare PPO | Attending: Cardiology | Admitting: Physician Assistant

## 2023-05-01 ENCOUNTER — Other Ambulatory Visit: Payer: Self-pay | Admitting: Internal Medicine

## 2023-05-01 ENCOUNTER — Encounter: Payer: Self-pay | Admitting: Physician Assistant

## 2023-05-01 ENCOUNTER — Ambulatory Visit: Payer: Self-pay

## 2023-05-01 VITALS — BP 114/60 | HR 65 | Ht 59.0 in | Wt 224.8 lb

## 2023-05-01 DIAGNOSIS — I2510 Atherosclerotic heart disease of native coronary artery without angina pectoris: Secondary | ICD-10-CM | POA: Diagnosis not present

## 2023-05-01 DIAGNOSIS — E7800 Pure hypercholesterolemia, unspecified: Secondary | ICD-10-CM

## 2023-05-01 DIAGNOSIS — I5032 Chronic diastolic (congestive) heart failure: Secondary | ICD-10-CM | POA: Diagnosis not present

## 2023-05-01 DIAGNOSIS — J309 Allergic rhinitis, unspecified: Secondary | ICD-10-CM

## 2023-05-01 DIAGNOSIS — I10 Essential (primary) hypertension: Secondary | ICD-10-CM | POA: Diagnosis not present

## 2023-05-01 HISTORY — DX: Atherosclerotic heart disease of native coronary artery without angina pectoris: I25.10

## 2023-05-01 MED ORDER — NITROGLYCERIN 0.4 MG SL SUBL: 0.4000 mg | SUBLINGUAL_TABLET | SUBLINGUAL | 1 refills | Status: DC | PRN

## 2023-05-01 NOTE — Assessment & Plan Note (Signed)
Chronic shortness of breath, NYHA class 2b. Stable volume status with recent weight loss and no peripheral edema. Discussed potential for SGLT2 inhibitor therapy, but patient prefers not to add more medications at this time. -Continue Furosemide 20mg  three times a week and Spironolactone 25mg  daily. -Continue daily weights and take extra Lasix if weight gain of more than 3 pounds in a day.

## 2023-05-01 NOTE — Patient Instructions (Signed)
Medication Instructions:   START TAKING : NITROGLYCERIN 0.4 MG SUBLINGUAL AS NEEDED FOR CHEST PAIN    *If you need a refill on your cardiac medications before your next appointment, please call your pharmacy*   Lab Work: NONE ORDERED  TODAY   If you have labs (blood work) drawn today and your tests are completely normal, you will receive your results only by: MyChart Message (if you have MyChart) OR A paper copy in the mail If you have any lab test that is abnormal or we need to change your treatment, we will call you to review the results.   Testing/Procedures: NONE ORDERED  TODAY     Follow-Up: At [facility], you and your health needs are our priority.  As part of our continuing mission to provide you with exceptional heart care, we have created designated Provider Care Teams.  These Care Teams include your primary Cardiologist (physician) and Advanced Practice Providers (APPs -  Physician Assistants and Nurse Practitioners) who all work together to provide you with the care you need, when you need it.  We recommend signing up for the patient portal called "MyChart".  Sign up information is provided on this After Visit Summary.  MyChart is used to connect with patients for Virtual Visits (Telemedicine).  Patients are able to view lab/test results, encounter notes, upcoming appointments, etc.  Non-urgent messages can be sent to your provider as well.   To learn more about what you can do with MyChart, go to [email or web address].    Your next appointment:   6 month(s)  Provider:  Tereso Newcomer PA-C    Other Instructions

## 2023-05-01 NOTE — Assessment & Plan Note (Signed)
LDL optimal in June 2024. -Continue Crestor 10mg  daily.

## 2023-05-01 NOTE — Progress Notes (Signed)
Cardiology Office Note:    Date:  05/01/2023  ID:  Anne Barnes, DOB Dec 18, 1945, MRN [id number] PCP: Bradd Canary, MD  [facility] Providers Cardiologist:  Christell Constant, MD Cardiology APP:  Beatrice Lecher, PA-C Beatrice Lecher, PA-C       Patient Profile:      Coronary artery disease Myoview 03/26/2017: No ischemia Non-obs dz - CCTA 10/31/2019: CAC score 127; RCA proximal and mid 25-49, LAD proximal and mid 0-25 (HFpEF) heart failure with preserved ejection fraction TTE 03/31/2021: EF 60-65, no RWMA, normal RVSF, trivial MR  TTE 11/05/2022: EF 55-60, no RWMA, mild concentric LVH, GR 2 DD, normal RVSF, moderate LAE, trivial MR, mild AV calcification, RAP 3 Bradycardia 2/2 beta-blocker  Cardiac monitor 12/2019: Sinus bradycardia, average heart rate 57, short episodes of SVT Hypertension Hyperlipidemia OSA intol to CPAP Asthma IBS   GERD Obesity           Discussed the use of AI scribe software for clinical note transcription with the patient, who gave verbal consent to proceed.  History of Present Illness   A 77 year old patient with a history of CAD and CHF presents for a six-month follow-up visit. The patient was last seen in the clinic in February, at which time she was experiencing leg edema and shortness of breath. An echocardiogram was performed, revealing moderate diastolic dysfunction but a normal ejection fraction.  The patient reports no significant changes in her condition since the last visit. She continues to experience chronic chest pain, which she describes as a brief, intermittent pressure in the left side of the chest. The pain is not associated with exertion or meals and has not changed in character or intensity. The patient also reports chronic shortness of breath, which she believes has been a long-standing issue, predating her cardiac diagnoses.  She has noted weight reduction and improved leg edema. The patient sleeps in a recliner due to  discomfort when lying flat and has been doing so for the past four years.  The patient has a history of hypertension, which is managed with valsartan and spironolactone. She occasionally withholds the valsartan if her morning blood pressure is low to avoid dizziness and lightheadedness. The patient also has hyperlipidemia, managed with Crestor, and her recent LDL level was optimal at 60.      ROS: See HPI    Studies Reviewed:        Risk Assessment/Calculations:             Physical Exam:   VS:  BP 114/60   Pulse 65   Ht 4\' 11"  (1.499 m)   Wt 224 lb 12.8 oz (102 kg)   SpO2 95%   BMI 45.40 kg/m    Wt Readings from Last 3 Encounters:  05/01/23 224 lb 12.8 oz (102 kg)  03/05/23 231 lb (104.8 kg)  01/29/23 234 lb 1.6 oz (106.2 kg)    GEN: Well nourished, well developed in no acute distress NECK: No HJR CARDIAC: RRR, no murmurs, rubs, gallops RESPIRATORY:  Clear to auscultation without rales, wheezing or rhonchi  ABDOMEN: Soft EXTREMITIES:  No edema     Assessment and Plan:  (HFpEF) heart failure with preserved ejection fraction (HCC) Chronic shortness of breath, NYHA class 2b. Stable volume status with recent weight loss and no peripheral edema. Discussed potential for SGLT2 inhibitor therapy, but patient prefers not to add more medications at this time. -Continue Furosemide 20mg  three times a week and Spironolactone 25mg  daily. -Continue  daily weights and take extra Lasix if weight gain of more than 3 pounds in a day.  CAD (coronary artery disease) Chronic chest pain, non-cardiac in nature with no significant change. No further testing needed at this time. LDL optimal at 60. -Continue Crestor 10mg  daily and Aspirin 80mg  daily. -Rx for Nitroglycerin for as-needed use.  Essential hypertension Blood pressure controlled. -Continue Valsartan 320mg  daily and Spironolactone 25mg  daily.  Hyperlipidemia LDL optimal in June 2024. -Continue Crestor 10mg  daily.        Dispo:   Return in about 6 months (around 11/01/2023) for Routine Follow Up, w/ Tereso Newcomer, PA-C.  Signed, Tereso Newcomer, PA-C

## 2023-05-01 NOTE — Assessment & Plan Note (Signed)
Blood pressure controlled. -Continue Valsartan 320mg  daily and Spironolactone 25mg  daily.

## 2023-05-01 NOTE — Assessment & Plan Note (Signed)
Chronic chest pain, non-cardiac in nature with no significant change. No further testing needed at this time. LDL optimal at 60. -Continue Crestor 10mg  daily and Aspirin 80mg  daily. -Rx for Nitroglycerin for as-needed use.

## 2023-05-03 DIAGNOSIS — I5032 Chronic diastolic (congestive) heart failure: Secondary | ICD-10-CM

## 2023-05-06 NOTE — Telephone Encounter (Signed)
Start Jardiance 10 mg once daily  Please give samples and a prescription and any Rx cards we may have to help with copay, etc. BMET in 3 weeks.  Tereso Newcomer, PA-C    05/06/2023 5:45 PM

## 2023-05-07 MED ORDER — EMPAGLIFLOZIN 10 MG PO TABS: 10.0000 mg | ORAL_TABLET | Freq: Every day | ORAL | 3 refills | Status: DC

## 2023-05-07 MED ORDER — EMPAGLIFLOZIN 10 MG PO TABS: 10.0000 mg | ORAL_TABLET | Freq: Every day | ORAL | 0 refills | Status: DC

## 2023-05-07 NOTE — Telephone Encounter (Signed)
Called pt to advise of providers recommendation.  Voicemail not set up unable to leave a message.  Will send pt a mychart message.

## 2023-05-14 ENCOUNTER — Ambulatory Visit: Payer: Medicare PPO | Admitting: Podiatry

## 2023-05-23 ENCOUNTER — Ambulatory Visit (INDEPENDENT_AMBULATORY_CARE_PROVIDER_SITE_OTHER): Payer: Medicare PPO

## 2023-05-23 DIAGNOSIS — J309 Allergic rhinitis, unspecified: Secondary | ICD-10-CM | POA: Diagnosis not present

## 2023-05-26 ENCOUNTER — Other Ambulatory Visit: Payer: Self-pay | Admitting: Allergy

## 2023-05-31 ENCOUNTER — Ambulatory Visit: Payer: Medicare PPO | Admitting: Allergy

## 2023-05-31 ENCOUNTER — Encounter: Payer: Self-pay | Admitting: Allergy

## 2023-05-31 ENCOUNTER — Other Ambulatory Visit: Payer: Self-pay

## 2023-05-31 VITALS — BP 162/80 | HR 86 | Temp 98.2°F | Resp 18

## 2023-05-31 DIAGNOSIS — J3089 Other allergic rhinitis: Secondary | ICD-10-CM

## 2023-05-31 DIAGNOSIS — I10 Essential (primary) hypertension: Secondary | ICD-10-CM | POA: Diagnosis not present

## 2023-05-31 DIAGNOSIS — J4520 Mild intermittent asthma, uncomplicated: Secondary | ICD-10-CM | POA: Diagnosis not present

## 2023-05-31 DIAGNOSIS — Z91018 Allergy to other foods: Secondary | ICD-10-CM | POA: Diagnosis not present

## 2023-05-31 MED ORDER — MONTELUKAST SODIUM 10 MG PO TABS: 10.0000 mg | ORAL_TABLET | Freq: Every day | ORAL | 5 refills | Status: DC

## 2023-05-31 MED ORDER — AZELASTINE HCL 0.05 % OP SOLN: 1.0000 [drp] | Freq: Two times a day (BID) | OPHTHALMIC | 5 refills | Status: DC | PRN

## 2023-05-31 MED ORDER — LEVOCETIRIZINE DIHYDROCHLORIDE 5 MG PO TABS: 5.0000 mg | ORAL_TABLET | Freq: Every evening | ORAL | 5 refills | Status: DC

## 2023-05-31 MED ORDER — FLUTICASONE PROPIONATE 50 MCG/ACT NA SUSP: NASAL | 5 refills | Status: DC

## 2023-05-31 MED ORDER — AZELASTINE HCL 0.1 % NA SOLN: 2.0000 | Freq: Two times a day (BID) | NASAL | 5 refills | Status: DC | PRN

## 2023-05-31 MED ORDER — VENTOLIN HFA 108 (90 BASE) MCG/ACT IN AERS: 2.0000 | INHALATION_SPRAY | RESPIRATORY_TRACT | 1 refills | Status: DC | PRN

## 2023-05-31 NOTE — Patient Instructions (Addendum)
Perennial allergic rhinitis - continue avoidance measures for dust mites, cat and dog.   - use Flonase (fluticasone) 1-2 sprays in each nostril once a day as needed for stuffy nose. In the right nostril, point the applicator out toward the right ear. In the left nostril, point the applicator out toward the left ear - continue Xyzal 5mg  daily - continue Singulair 10mg  daily - for nasal drainage and post-nasal drip continue use of nasal antihistamine, Astelin 2 sprays each nostril 2 times a day.   - recommend performing nasal saline rinses prior to use of medicated nasal sprays to help flush out the sinuses.  Use with distilled water at room temperature.  Breathe in and out through your mouth during the entire process. - use Optivar 1 drop each eye up to twice a day as needed for itchy/watery/red eyes.  - continue allergy injections per schedule.  Almost to 4 week intervals and once there for about 6 months will discuss when able to stop shots  Food allergy - continue food avoidance of shellfish, gluten, soybean, peanut  - have access to your epinephrine device 0.3mg  in case of allergic reaction.   - follow emergency action plan in case of allergic reaction   Mild intermittent asthma - have access to albuterol inhaler 2 puffs every 4-6 hours as needed for cough/wheeze/shortness of breath/chest tightness.  May use 15-20 minutes prior to activity.   Monitor frequency of use.    Follow-up 6 months with plans to repeat environmental allergy testing.  Hold antihistamine (Xyzal) for 3 days prior to this visit

## 2023-05-31 NOTE — Progress Notes (Signed)
Patient in wante   Follow-up Note  RE: Anne Barnes MRN: [id number] DOB: 1945-10-04 Date of Office Visit: 05/31/2023   History of present illness: Anne Barnes is a 77 y.o. female presenting today for follow-up of allergic rhinitis, food allergy, asthma.  She was last seen in the office on 01/29/2023 by myself.  She lost her daughter 2 weeks ago.  This has been a difficult time for her in her grieving.  She has had some issues with BP control in this time frame and she feels related to the stress from the death of her daughter..   She feels fatigued after her allergy shots for about 24-48 hours.  However she states has always been the case with her allergy shots and then she feels fine after this timeframe.  She has noted improvement in symptoms since she has been on allergen immunotherapy.  She is approaching every 4 weeks later in the year.  She tolerates the injections themselves without large local symptoms.  She currently is noting some mucus buildup in the throat.  She does continue on Xyzal and Singulair and has access to nasal sprays including Astelin and Flonase.  She also access to Optivar eyedrops for itchy watery eyes.  She is waiting for the time that she can discontinue her allergy shots when she has had full improvement. She has used albuterol couple times since last visit and does use it to help break up the mucus and may use handful times a month and usually clustered. She continues to avoid shellfish, soybean and peanut in the diet and does her best to avoid gluten products.  She has not had any need for epinephrine device however.  Review of systems: 10pt ROS negative unless noted in HPI  Past medical/social/surgical/family history have been reviewed and are unchanged unless specifically indicated below.  No changes  Medication List: Current Outpatient Medications  Medication Sig Dispense Refill   albuterol (VENTOLIN HFA) 108 (90 Base) MCG/ACT inhaler INHALE 2 PUFFS BY  MOUTH EVERY 6 HOURS AS NEEDED FOR WHEEZING FOR SHORTNESS OF BREATH 18 g 1   alendronate (FOSAMAX) 70 MG tablet Take 1 tablet (70 mg total) by mouth once a week. Take with a full glass of water on an empty stomach. 4 tablet 5   ALPRAZolam (XANAX) 0.5 MG tablet TAKE 1 TABLET BY MOUTH TWICE DAILY AS NEEDED FOR ANXIETY AND FOR SLEEP 20 tablet 0   aspirin EC 81 MG tablet Take 81 mg by mouth daily. Swallow whole.     azelastine (ASTELIN) 0.1 % nasal spray Place 2 sprays into both nostrils 2 (two) times daily as needed for rhinitis. 30 mL 5   azelastine (OPTIVAR) 0.05 % ophthalmic solution Place 1 drop into both eyes 2 (two) times daily as needed. 6 mL 5   b complex vitamins tablet Take 1 tablet by mouth daily.     Cholecalciferol (VITAMIN D) 50 MCG (2000 UT) tablet Take 2,000 Units by mouth daily.     clotrimazole-betamethasone (LOTRISONE) cream Apply 1 Application topically daily. 45 each 0   empagliflozin (JARDIANCE) 10 MG TABS tablet Take 1 tablet (10 mg total) by mouth daily before breakfast. 90 tablet 3   empagliflozin (JARDIANCE) 10 MG TABS tablet Take 1 tablet (10 mg total) by mouth daily before breakfast. 28 tablet 0   EPINEPHrine 0.3 mg/0.3 mL IJ SOAJ injection Inject 0.3 mg into the muscle as needed for anaphylaxis. 2 each 1   esomeprazole (NEXIUM) 40 MG capsule Take  1 capsule (40 mg total) by mouth daily. Please call [phone] to schedule an appointment for further refills. 90 capsule 0   fluticasone (FLONASE) 50 MCG/ACT nasal spray Use 1-2 sprays in each nostril once  a day as needed for stuffy nose. In the right nostril, point the applicator out toward the right ear. In the left nostril, point the applicator out toward the left ear 16 g 5   furosemide (LASIX) 20 MG tablet Take 1 tablet (20 mg total) by mouth 3 (three) times a week. Monday, Wednesday and Friday. 90 tablet 3   levocetirizine (XYZAL) 5 MG tablet TAKE 1 TABLET BY MOUTH ONCE DAILY IN THE EVENING 30 tablet 5   montelukast  (SINGULAIR) 10 MG tablet TAKE 1 TABLET BY MOUTH AT BEDTIME 30 tablet 0   Multiple Vitamin (MULTIVITAMIN) tablet Take 1 tablet by mouth daily.     nitroGLYCERIN (NITROSTAT) 0.4 MG SL tablet Place 1 tablet (0.4 mg total) under the tongue every 5 (five) minutes as needed for chest pain. 25 tablet 1   rosuvastatin (CRESTOR) 10 MG tablet Take 1 tablet (10 mg total) by mouth daily. 90 tablet 3   spironolactone (ALDACTONE) 25 MG tablet Take 1 tablet (25 mg total) by mouth daily. 90 tablet 2   valsartan (DIOVAN) 320 MG tablet Take 1 tablet (320 mg total) by mouth daily. 90 tablet 3   VENTOLIN HFA 108 (90 Base) MCG/ACT inhaler Inhale 2 puffs into the lungs every 4 (four) hours as needed for wheezing or shortness of breath. 18 g 1   oxybutynin (DITROPAN XL) 10 MG 24 hr tablet Take 1 tablet (10 mg total) by mouth at bedtime. (Patient not taking: Reported on 05/31/2023) 30 tablet 2   No current facility-administered medications for this visit.     Known medication allergies: Allergies  Allergen Reactions   Codeine Nausea And Vomiting   Hydralazine Hcl Other (See Comments)    Pt reports causes dizziness   Lisinopril-Hydrochlorothiazide Hives    REACTION: lip swelling,facial swelling,rash.   Metoprolol Other (See Comments)    Does not feel well on it    Other Cough    Patient states she is allergic to Shrimp, egg whites, tomatoes and dairy products.  Says they make her feel unwell but do not cause hives, SOB or anaphylaxis.  She just avoids eating them because they can make her have a headache.  Strawberries, watermelon, peanuts, and all shellfish.    environmental   Shrimp [Shellfish Allergy] Other (See Comments)    Was allergy tested and showed shrimp was one--has not reacted b/c avoided     Physical examination: Blood pressure (!) 162/80, pulse 86, temperature 98.2 F (36.8 C), temperature source Temporal, resp. rate 18, SpO2 98%.  General: Alert, interactive, in no acute distress. HEENT:  PERRLA, TMs pearly gray, turbinates minimally edematous without discharge, post-pharynx non erythematous. Neck: Supple without lymphadenopathy. Lungs: Clear to auscultation without wheezing, rhonchi or rales. {no increased work of breathing. CV: Normal S1, S2 without murmurs. Abdomen: Nondistended, nontender. Skin: Warm and dry, without lesions or rashes. Extremities:  No clubbing, cyanosis or edema. Neuro:   Grossly intact.  Diagnositics/Labs: Allergen immunotherapy given today in office   Assessment and plan: Perennial allergic rhinitis - continue avoidance measures for dust mites, cat and dog.   - use Flonase (fluticasone) 1-2 sprays in each nostril once a day as needed for stuffy nose. In the right nostril, point the applicator out toward the right ear. In the left nostril,  point the applicator out toward the left ear - continue Xyzal 5mg  daily - continue Singulair 10mg  daily - for nasal drainage and post-nasal drip continue use of nasal antihistamine, Astelin 2 sprays each nostril 2 times a day.   - recommend performing nasal saline rinses prior to use of medicated nasal sprays to help flush out the sinuses.  Use with distilled water at room temperature.  Breathe in and out through your mouth during the entire process. - use Optivar 1 drop each eye up to twice a day as needed for itchy/watery/red eyes.  - continue allergy injections per schedule.  Almost to 4 week intervals and once there for about 6 months will discuss when able to stop shots  Food allergy - continue food avoidance of shellfish, gluten, soybean, peanut  - have access to your epinephrine device 0.3mg  in case of allergic reaction.   - follow emergency action plan in case of allergic reaction   Mild intermittent asthma - have access to albuterol inhaler 2 puffs every 4-6 hours as needed for cough/wheeze/shortness of breath/chest tightness.  May use 15-20 minutes prior to activity.   Monitor frequency of use.     Elevated blood pressure in the office - She has a lot of stress right now with the death of her daughter.  She does take her blood pressure at home and has been high at home over the past several weeks.  She will be in touch with her cardiologist in regards to her BP control.  Follow-up 6 months with plans to repeat environmental allergy testing.  Hold antihistamine (Xyzal) for 3 days prior to this visit  I appreciate the opportunity to take part in [facility] care. Please do not hesitate to contact me with questions.  Sincerely,   Margo Aye, MD Allergy/Immunology [facility]

## 2023-06-07 ENCOUNTER — Ambulatory Visit (INDEPENDENT_AMBULATORY_CARE_PROVIDER_SITE_OTHER): Payer: Self-pay

## 2023-06-07 DIAGNOSIS — J3089 Other allergic rhinitis: Secondary | ICD-10-CM

## 2023-06-10 ENCOUNTER — Ambulatory Visit: Payer: Medicare PPO | Admitting: Podiatry

## 2023-06-10 ENCOUNTER — Ambulatory Visit (INDEPENDENT_AMBULATORY_CARE_PROVIDER_SITE_OTHER): Payer: Medicare PPO

## 2023-06-10 DIAGNOSIS — G629 Polyneuropathy, unspecified: Secondary | ICD-10-CM | POA: Diagnosis not present

## 2023-06-10 DIAGNOSIS — B351 Tinea unguium: Secondary | ICD-10-CM | POA: Diagnosis not present

## 2023-06-10 DIAGNOSIS — M7752 Other enthesopathy of left foot: Secondary | ICD-10-CM | POA: Diagnosis not present

## 2023-06-10 DIAGNOSIS — M79674 Pain in right toe(s): Secondary | ICD-10-CM

## 2023-06-10 DIAGNOSIS — M79675 Pain in left toe(s): Secondary | ICD-10-CM | POA: Diagnosis not present

## 2023-06-10 DIAGNOSIS — M7751 Other enthesopathy of right foot: Secondary | ICD-10-CM

## 2023-06-10 NOTE — Patient Instructions (Signed)
For topical creams like Aspercreme with lidocaine, capsaicin cream.  He could also use Biofreeze.

## 2023-06-10 NOTE — Progress Notes (Signed)
Subjective: Chief Complaint  Patient presents with   Toe Pain    Pt stated that she feels like she has liquid between her toes     The patient, with a history of CHF, presents with a complaint of wet toes and a spongy sensation in their feet. The symptoms are bilateral, but primarily affect the right foot. The patient noticed these symptoms after their last appointment. They have tried soaking their feet in Epsom salt water, which provides temporary relief. The patient also reports that their socks sometimes become damp, particularly in the toe area. The patient also reports a painful sensation in the tips of their toes. This does not disturb their sleep. The patient also reports a tender area in their ankle, which is new since last appointment. No injuries.   She also would like her nails trimmed today as they are causing pain, elongated.  She had an in-service appointment as her daughter recently passed away.  Objective: AAO x3, NAD DP/PT pulses palpable bilaterally, CRT less than 3 seconds Chronic appearing lower extremity edema present bilaterally.  There is no erythema or warmth. Nails are hypertrophic, dystrophic, brittle, discolored, elongated 10. No surrounding redness or drainage. Tenderness nails 1-5 bilaterally. No open lesions or pre-ulcerative lesions are identified today. She is some mild discomfort on the course the posterior tibial, flexor tendons on the right ankle and somewhat on the intermediate ankle gutter.  There is no pain with ankle joint range of motion.  No area pinpoint tenderness.  There is a decreased medial arch upon weightbearing.  Flexor, extensor tendons.  Intact. No pain with calf compression, swelling, warmth, erythema  Assessment: 77 year old female with symptomatic onychosis, flatfoot/tendinitis; concern for neuropathy  Plan: -All treatment options discussed with the patient including all alternatives, risks, complications.  -X-rays obtained reviewed of  the right ankle.  3 views of the ankle were obtained.  Decreased calcaneal inclination angle.  No evidence of acute fracture.  Mild narrowing on the ankle joint. Calcaneal spurring present.  -We discussed neuropathy.  We discussed medications for this patient was offered oral medication.  Discussed topical creams that she can use.  I think her symptoms are a result of neuropathy she has palpable pulses.  If symptoms persist, check ABI.  -We discussed shoes, good arch support to proceed with a walking.  Discussed with stretching, icing on a regular basis. Consider PT.  -I do not appreciate any significant restriction in the toes.  Discussed drying thoroughly between toes.  Discussed antiperspirant spray as well. -Patient encouraged to call the office with any questions, concerns, change in symptoms.   Vivi Barrack DPM

## 2023-06-12 ENCOUNTER — Ambulatory Visit: Payer: Medicare PPO | Attending: Physician Assistant

## 2023-06-12 DIAGNOSIS — I5032 Chronic diastolic (congestive) heart failure: Secondary | ICD-10-CM

## 2023-06-12 LAB — BASIC METABOLIC PANEL WITH GFR
BUN/Creatinine Ratio: 23 (ref 12–28)
BUN: 26 mg/dL (ref 8–27)
CO2: 25 mmol/L (ref 20–29)
Calcium: 9.3 mg/dL (ref 8.7–10.3)
Chloride: 102 mmol/L (ref 96–106)
Creatinine, Ser: 1.12 mg/dL — ABNORMAL HIGH (ref 0.57–1.00)
Glucose: 115 mg/dL — ABNORMAL HIGH (ref 70–99)
Potassium: 4.6 mmol/L (ref 3.5–5.2)
Sodium: 140 mmol/L (ref 134–144)
eGFR: 51 mL/min/{1.73_m2} — ABNORMAL LOW (ref >59)

## 2023-06-12 LAB — COMPREHENSIVE METABOLIC PANEL WITH GFR: EGFR: 55.6

## 2023-06-13 ENCOUNTER — Ambulatory Visit (INDEPENDENT_AMBULATORY_CARE_PROVIDER_SITE_OTHER): Payer: Medicare PPO

## 2023-06-13 DIAGNOSIS — J309 Allergic rhinitis, unspecified: Secondary | ICD-10-CM

## 2023-06-28 ENCOUNTER — Ambulatory Visit (INDEPENDENT_AMBULATORY_CARE_PROVIDER_SITE_OTHER): Payer: Medicare PPO | Admitting: *Deleted

## 2023-06-28 DIAGNOSIS — J309 Allergic rhinitis, unspecified: Secondary | ICD-10-CM | POA: Diagnosis not present

## 2023-07-08 ENCOUNTER — Ambulatory Visit (INDEPENDENT_AMBULATORY_CARE_PROVIDER_SITE_OTHER): Payer: Medicare PPO | Admitting: Family Medicine

## 2023-07-08 ENCOUNTER — Encounter: Payer: Self-pay | Admitting: Family Medicine

## 2023-07-08 VITALS — BP 138/84 | HR 64 | Temp 98.1°F | Ht 59.0 in | Wt 228.0 lb

## 2023-07-08 DIAGNOSIS — H6123 Impacted cerumen, bilateral: Secondary | ICD-10-CM

## 2023-07-08 DIAGNOSIS — J0140 Acute pansinusitis, unspecified: Secondary | ICD-10-CM | POA: Diagnosis not present

## 2023-07-08 MED ORDER — AMOXICILLIN-POT CLAVULANATE 875-125 MG PO TABS
1.0000 | ORAL_TABLET | Freq: Two times a day (BID) | ORAL | 0 refills | Status: DC
Start: 2023-07-08 — End: 2023-07-24

## 2023-07-08 MED ORDER — GUAIFENESIN ER 600 MG PO TB12
1200.0000 mg | ORAL_TABLET | Freq: Two times a day (BID) | ORAL | 0 refills | Status: DC
Start: 2023-07-08 — End: 2023-09-24

## 2023-07-08 MED ORDER — BENZONATATE 200 MG PO CAPS
200.0000 mg | ORAL_CAPSULE | Freq: Two times a day (BID) | ORAL | 0 refills | Status: DC | PRN
Start: 2023-07-08 — End: 2023-09-24

## 2023-07-08 NOTE — Patient Instructions (Signed)
Given duration of symptoms starting Augmentin  - take with food and water and add probiotic at least 2 hours apart from antibiotic dose Adding Mucinex for congestion and Tessalon for cough - discussed pulmonary hygiene Continue supportive measures including rest, hydration, humidifier use, steam showers, warm compresses to sinuses, warm liquids with lemon and honey, and over-the-counter cough, cold, and analgesics as needed.     The following information is provided as a Counsellor for ADULT patients only and does NOT take into account PREGNANCY, ALLERGIES, LIVER CONDITIONS, KIDNEY CONDITIONS, GASTROINTESTINAL CONDITIONS, OR PRESCRIPTION MEDICATION INTERACTIONS. Please be sure to ask your provider if the following are safe to take with your specific medical history, conditions, or current medication regimen if you are unsure.   Adult Basic Symptom Management for Sinusitis  Congestion: Guaifenesin (Mucinex)- follow directions on packaging with a maximum dose of 2400mg  in a 24 hour period.  Pain/Fever: Ibuprofen 200mg  - 400mg  every 4-6 hours as needed (MAX 1200mg  in a 24 hour period) Pain/Fever: Tylenol 500mg  -1000mg  every 6-8 hours as needed (MAX 3000mg  in a 24 hour period)  Cough: Dextromethorphan (Delsym)- follow directions on packing with a maximum dose of 120mg  in a 24 hour period.  Nasal Stuffiness: Saline nasal spray and/or Nettie Pot with sterile saline solution  Runny Nose: Fluticasone nasal spray (Flonase) OR Mometasone nasal spray (Nasonex) OR Triamcinolone Acetonide nasal spray (Nasacort)- follow directions on the packaging  Pain/Pressure: Warm washcloth to the face  Sore Throat: Warm salt water gargles  If you have allergies, you may also consider taking an oral antihistamine (like Zyrtec or Claritin) as these may also help with your symptoms.  **Many medications will have more than one ingredient, be sure you are reading the packaging carefully and not taking more than  one dose of the same kind of medication at the same time or too close together. It is OK to use formulas that have all of the ingredients you want, but do not take them in a combined medication and as separate dose too close together. If you have any questions, the pharmacist will be happy to help you decide what is safe.

## 2023-07-08 NOTE — Progress Notes (Signed)
Acute Office Visit  Subjective:     Patient ID: Anne Barnes, female    DOB: 1945/12/30, 77 y.o.   MRN: [id number]  Chief Complaint  Patient presents with   Cough   Nasal Congestion    HPI Patient is in today for URI symptoms.    Discussed the use of AI scribe software for clinical note transcription with the patient, who gave verbal consent to proceed.  History of Present Illness   The patient presents with a month-long history of upper respiratory symptoms. The onset of symptoms coincided with a period of emotional stress following the loss of her daughter. The patient's symptoms worsened after exposure to a family member who works in a nursing home and had similar symptoms. The patient's symptoms include sinus pressure, headaches, cough, and significant mucus production, which is particularly bothersome during eating and drinking. The patient has been self-managing with over-the-counter Mucinex and prescribed allergy medication, but reports that the Mucinex Fast Max causes an unpleasant sensation in her head. Despite the prolonged duration of symptoms, the patient tested negative for COVID-19. The patient also reports a history of ear wax build-up.             ROS All review of systems negative except what is listed in the HPI      Objective:    BP 138/84   Pulse 64   Temp 98.1 F (36.7 C) (Oral)   Ht 4\' 11"  (1.499 m)   Wt 228 lb (103.4 kg)   SpO2 97%   BMI 46.05 kg/m    Physical Exam Vitals reviewed.  Constitutional:      Appearance: Normal appearance.  HENT:     Head:     Comments: Maxillary/ethmoid sinuses TTP    Right Ear: There is impacted cerumen.     Left Ear: There is impacted cerumen.     Nose: Congestion present.     Mouth/Throat:     Mouth: Mucous membranes are moist.     Pharynx: No oropharyngeal exudate or posterior oropharyngeal erythema.  Eyes:     Conjunctiva/sclera: Conjunctivae normal.  Cardiovascular:     Rate and Rhythm:  Normal rate and regular rhythm.     Heart sounds: Normal heart sounds.  Pulmonary:     Effort: Pulmonary effort is normal.     Breath sounds: Normal breath sounds. No wheezing, rhonchi or rales.  Musculoskeletal:     Cervical back: Normal range of motion and neck supple.  Skin:    General: Skin is warm and dry.  Neurological:     Mental Status: She is alert and oriented to person, place, and time.  Psychiatric:        Mood and Affect: Mood normal.        Behavior: Behavior normal.        Thought Content: Thought content normal.        Judgment: Judgment normal.     No results found for any visits on 07/08/23.      Assessment & Plan:   Problem List Items Addressed This Visit   None Visit Diagnoses     Acute non-recurrent pansinusitis    -  Primary   Relevant Medications   amoxicillin-clavulanate (AUGMENTIN) 875-125 MG tablet   benzonatate (TESSALON) 200 MG capsule   guaiFENesin (MUCINEX) 600 MG 12 hr tablet   Bilateral impacted cerumen              Upper Respiratory Infection Prolonged symptoms of sinus pressure,  headache, cough, and mucus production. Negative COVID test. Likely sinusitis given the duration and severity of symptoms. -Start Amoxicillin-Clavulanate, take with food and water. -Continue probiotic, ensuring it is taken at least two hours away from the antibiotic. -Discontinue current Mucinex product due to potential side effects. -Start plain Mucinex to help break up mucus. -Provide cough pearls to be taken twice daily, particularly before bedtime.  Cerumen Impaction Bilateral ear fullness due to wax buildup. -Perform ear irrigation in office today.     Indication: Cerumen impaction of the ear(s)   Medical necessity statement: On physical examination, cerumen impairs clinically significant portions of the external auditory canal, and tympanic membrane. Noted obstructive, copious cerumen that cannot be removed without magnification and  instrumentations requiring professional removal.   Consent: Discussed benefits and risks of procedure and verbal consent obtained  Procedure: Patient was prepped for the procedure. Otoscope utilized to assess and take note of the ear canal, the tympanic membrane, and the presence, amount, and placement of the cerumen.  Gentle irrigation with water at body temperature and soft plastic curette utilized to remove impacted cerumen.  Excess water drained by gravity and ear canal(s) dried with clean guaze.  Post procedure examination: Otoscopic examination reveals complete cerumen removal with no damage to the auditory canal, tympanic membrane, or surrounding tissue.  Patient tolerated procedure well.   Post procedure instructions: Patient made aware that they may experience temporary vertigo, temporary changes in hearing, and temporary discomfort. If these symptom last for more than 24 hours to call the clinic or proceed to the ED for further evaluation. Discussed avoiding placing objects into the ear canal for cleaning.   Meds ordered this encounter  Medications   amoxicillin-clavulanate (AUGMENTIN) 875-125 MG tablet    Sig: Take 1 tablet by mouth 2 (two) times daily.    Dispense:  20 tablet    Refill:  0    Order Specific Question:   Supervising Provider    Answer:   Danise Edge A [[id number]]   benzonatate (TESSALON) 200 MG capsule    Sig: Take 1 capsule (200 mg total) by mouth 2 (two) times daily as needed for cough.    Dispense:  20 capsule    Refill:  0    Order Specific Question:   Supervising Provider    Answer:   Danise Edge A [[id number]]   guaiFENesin (MUCINEX) 600 MG 12 hr tablet    Sig: Take 2 tablets (1,200 mg total) by mouth 2 (two) times daily.    Dispense:  30 tablet    Refill:  0    Order Specific Question:   Supervising Provider    Answer:   Danise Edge A [[id number]]    Return if symptoms worsen or fail to improve.  Clayborne Dana, NP

## 2023-07-12 ENCOUNTER — Telehealth: Payer: Self-pay | Admitting: Internal Medicine

## 2023-07-12 MED ORDER — ALBUTEROL SULFATE HFA 108 (90 BASE) MCG/ACT IN AERS: INHALATION_SPRAY | RESPIRATORY_TRACT | 12 refills | Status: DC

## 2023-07-12 NOTE — Telephone Encounter (Signed)
[facility] requests refill albuterol hfa

## 2023-07-24 ENCOUNTER — Ambulatory Visit (INDEPENDENT_AMBULATORY_CARE_PROVIDER_SITE_OTHER): Payer: Medicare PPO | Admitting: *Deleted

## 2023-07-24 DIAGNOSIS — J309 Allergic rhinitis, unspecified: Secondary | ICD-10-CM

## 2023-07-24 NOTE — Assessment & Plan Note (Signed)
Encouraged to get adequate exercise, calcium and vitamin d intake. Is tolerating Fosamax weekly, warned regarding possible side effects and asked to report them. Had forgotten some doses but is doing better now.

## 2023-07-24 NOTE — Assessment & Plan Note (Signed)
Encouraged increased hydration, 64 ounces of clear fluids daily. Minimize alcohol and caffeine. Eat small frequent meals with lean proteins and complex carbs. Avoid high and low blood sugars. Get adequate sleep, 7-8 hours a night. Needs exercise daily preferably in the morning.  

## 2023-07-24 NOTE — Assessment & Plan Note (Signed)
Well controlled, no changes to meds. Encouraged heart healthy diet such as the DASH diet and exercise as tolerated.  °

## 2023-07-24 NOTE — Assessment & Plan Note (Signed)
Asymptomatic, continue to monitor.

## 2023-07-24 NOTE — Assessment & Plan Note (Signed)
Tolerating statin, encouraged heart healthy diet, avoid trans fats, minimize simple carbs and saturated fats. Increase exercise as tolerated. Is tolerating Rosuvastatin

## 2023-07-24 NOTE — Assessment & Plan Note (Signed)
Supplement and monitor 

## 2023-07-24 NOTE — Assessment & Plan Note (Signed)
hgba1c acceptable, minimize simple carbs. Increase exercise as tolerated.  

## 2023-07-25 ENCOUNTER — Ambulatory Visit (INDEPENDENT_AMBULATORY_CARE_PROVIDER_SITE_OTHER): Payer: Medicare PPO | Admitting: Family Medicine

## 2023-07-25 ENCOUNTER — Encounter: Payer: Self-pay | Admitting: Family Medicine

## 2023-07-25 VITALS — BP 130/74 | HR 72 | Temp 98.2°F | Resp 18 | Ht 59.0 in | Wt 229.4 lb

## 2023-07-25 DIAGNOSIS — R739 Hyperglycemia, unspecified: Secondary | ICD-10-CM | POA: Diagnosis not present

## 2023-07-25 DIAGNOSIS — E559 Vitamin D deficiency, unspecified: Secondary | ICD-10-CM

## 2023-07-25 DIAGNOSIS — M810 Age-related osteoporosis without current pathological fracture: Secondary | ICD-10-CM | POA: Diagnosis not present

## 2023-07-25 DIAGNOSIS — Z0000 Encounter for general adult medical examination without abnormal findings: Secondary | ICD-10-CM

## 2023-07-25 DIAGNOSIS — R519 Headache, unspecified: Secondary | ICD-10-CM | POA: Diagnosis not present

## 2023-07-25 DIAGNOSIS — I10 Essential (primary) hypertension: Secondary | ICD-10-CM

## 2023-07-25 DIAGNOSIS — Z91018 Allergy to other foods: Secondary | ICD-10-CM | POA: Diagnosis not present

## 2023-07-25 DIAGNOSIS — E7800 Pure hypercholesterolemia, unspecified: Secondary | ICD-10-CM | POA: Diagnosis not present

## 2023-07-25 DIAGNOSIS — I5032 Chronic diastolic (congestive) heart failure: Secondary | ICD-10-CM

## 2023-07-25 NOTE — Patient Instructions (Addendum)
[employer] Live to 100 the [employer]  10 ounces every 1-2 hours during the day Protein every 3-4 hours   Preventive Care 65 Years and Older, Female Preventive care refers to lifestyle choices and visits with your health care provider that can promote health and wellness. Preventive care visits are also called wellness exams. What can I expect for my preventive care visit? Counseling Your health care provider may ask you questions about your: Medical history, including: Past medical problems. Family medical history. Pregnancy and menstrual history. History of falls. Current health, including: Memory and ability to understand (cognition). Emotional well-being. Home life and relationship well-being. Sexual activity and sexual health. Lifestyle, including: Alcohol, nicotine or tobacco, and drug use. Access to firearms. Diet, exercise, and sleep habits. Work and work Astronomer. Sunscreen use. Safety issues such as seatbelt and bike helmet use. Physical exam Your health care provider will check your: Height and weight. These may be used to calculate your BMI (body mass index). BMI is a measurement that tells if you are at a healthy weight. Waist circumference. This measures the distance around your waistline. This measurement also tells if you are at a healthy weight and may help predict your risk of certain diseases, such as type 2 diabetes and high blood pressure. Heart rate and blood pressure. Body temperature. Skin for abnormal spots. What immunizations do I need?  Vaccines are usually given at various ages, according to a schedule. Your health care provider will recommend vaccines for you based on your age, medical history, and lifestyle or other factors, such as travel or where you work. What tests do I need? Screening Your health care provider may recommend screening tests for certain conditions. This may include: Lipid and cholesterol levels. Hepatitis C  test. Hepatitis B test. HIV (human immunodeficiency virus) test. STI (sexually transmitted infection) testing, if you are at risk. Lung cancer screening. Colorectal cancer screening. Diabetes screening. This is done by checking your blood sugar (glucose) after you have not eaten for a while (fasting). Mammogram. Talk with your health care provider about how often you should have regular mammograms. BRCA-related cancer screening. This may be done if you have a family history of breast, ovarian, tubal, or peritoneal cancers. Bone density scan. This is done to screen for osteoporosis. Talk with your health care provider about your test results, treatment options, and if necessary, the need for more tests. Follow these instructions at home: Eating and drinking  Eat a diet that includes fresh fruits and vegetables, whole grains, lean protein, and low-fat dairy products. Limit your intake of foods with high amounts of sugar, saturated fats, and salt. Take vitamin and mineral supplements as recommended by your health care provider. Do not drink alcohol if your health care provider tells you not to drink. If you drink alcohol: Limit how much you have to 0-1 drink a day. Know how much alcohol is in your drink. In the [place] one drink equals one 12 oz bottle of beer (355 mL), one 5 oz glass of wine (148 mL), or one 1 oz glass of hard liquor (44 mL). Lifestyle Brush your teeth every morning and night with fluoride toothpaste. Floss one time each day. Exercise for at least 30 minutes 5 or more days each week. Do not use any products that contain nicotine or tobacco. These products include cigarettes, chewing tobacco, and vaping devices, such as e-cigarettes. If you need help quitting, ask your health care provider. Do not use drugs. If you are  sexually active, practice safe sex. Use a condom or other form of protection in order to prevent STIs. Take aspirin only as told by your health care provider.  Make sure that you understand how much to take and what form to take. Work with your health care provider to find out whether it is safe and beneficial for you to take aspirin daily. Ask your health care provider if you need to take a cholesterol-lowering medicine (statin). Find healthy ways to manage stress, such as: Meditation, yoga, or listening to music. Journaling. Talking to a trusted person. Spending time with friends and family. Minimize exposure to UV radiation to reduce your risk of skin cancer. Safety Always wear your seat belt while driving or riding in a vehicle. Do not drive: If you have been drinking alcohol. Do not ride with someone who has been drinking. When you are tired or distracted. While texting. If you have been using any mind-altering substances or drugs. Wear a helmet and other protective equipment during sports activities. If you have firearms in your house, make sure you follow all gun safety procedures. What's next? Visit your health care provider once a year for an annual wellness visit. Ask your health care provider how often you should have your eyes and teeth checked. Stay up to date on all vaccines. This information is not intended to replace advice given to you by your health care provider. Make sure you discuss any questions you have with your health care provider. Document Revised: 03/08/2021 Document Reviewed: 03/08/2021 Elsevier Patient Education  2024 [employer].

## 2023-07-25 NOTE — Assessment & Plan Note (Signed)
Receiving allergy shots for almost 4 years.

## 2023-07-25 NOTE — Progress Notes (Signed)
Subjective:    Patient ID: Anne Barnes, female    DOB: 08/11/1946, 77 y.o.   MRN: [id number]  Chief Complaint  Patient presents with   Annual Exam    HPI Discussed the use of AI scribe software for clinical note transcription with the patient, who gave verbal consent to proceed.  History of Present Illness   The patient, a Jehovah's Witness, presents with a history of CHF, now in her sixth year. She reports occasional difficulty breathing and a quick loss of energy, but states she is able to perform necessary tasks around the house and even some physical activities like blowing off the deck. She also mentions a recent mucus problem, which she manages with albuterol. The patient is currently on Jardiance and Xanax, but expresses dissatisfaction with the side effects of frequent urination from Jardiance and the after-effects of Xanax. She maintains a healthy diet, favoring fruits and vegetables, and is diligent about hydration.        Past Medical History:  Diagnosis Date   Acute combined systolic and diastolic heart failure (HCC) 05/26/2015   ALLERGIC RHINITIS    Allergic urticaria 08/01/2014   Patient reports allergies to shrimp, egg whites, tomatoes, dairy    Allergy    seasonal and numerous food and drug allergies.   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   Office Spirometry 11/07/17-WNL-FVC 1.58/82%, FEV1 1.27/85%, ratio 0.80, FEF 25-75% 0.26/89%   Bradycardia 02/25/2017   CAD (coronary artery disease) 05/01/2023   Myoview 03/26/2017: No ischemia Non-obs dz - CCTA 10/31/2019: CAC score 127; RCA proximal and mid 25-49, LAD proximal and mid 0-25    Chiari malformation    Noted MRI brain 09/2013 - s/p neuro eval for same   DIVERTICULOSIS, COLON    Essential hypertension 08/28/2010   Qualifier: Diagnosis of  By: Charlsie Quest RMA, Lucy      Frequent headaches    GERD    H/O measles    H/O mumps    Hearing loss 01/18/2017   History of chicken pox    Hyperglycemia 03/05/2016    HYPERTENSION    Hypocalcemia 02/25/2017   IBS (irritable bowel syndrome) 05/26/2015   Nonspecific abnormal electrocardiogram (ECG) (EKG)    OBSTRUCTIVE SLEEP APNEA 12/2008 dx   noncompliant with CPAP qhs   Obstructive sleep apnea 08/28/2010   NPSG 12/2008:  AHI 13/hr with desats to 78%    Preventative health care 06/11/2017   Sciatica    right side   Seasonal and perennial allergic rhinitis 08/28/2010   Allergy profile 03/11/14- positive especially for dust, cat and dog Food allergy profile- total IgE 86 with several food group elevations Sed rate-WNL      Sinusitis 06/06/2017   Sleep apnea    uses cpap   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   Vaginitis 01/18/2017   Vitamin D deficiency 06/11/2017    Past Surgical History:  Procedure Laterality Date   ABDOMINAL HYSTERECTOMY  1986   BIOPSY  07/19/2022   Procedure: BIOPSY;  Surgeon: Shellia Cleverly, DO;  Location: [facility];  Service: Gastroenterology;;   BREAST SURGERY  1973   Breast biopsy   COLONOSCOPY  2011   Dr Loreta Ave   ESOPHAGOGASTRODUODENOSCOPY (EGD) WITH PROPOFOL N/A 07/19/2022   Procedure: ESOPHAGOGASTRODUODENOSCOPY (EGD) WITH PROPOFOL;  Surgeon: Shellia Cleverly, DO;  Location: [facility];  Service: Gastroenterology;  Laterality: N/A;   UMBILICAL HERNIA REPAIR      Family History  Problem Relation Age of Onset   Arthritis Mother  died of complications from hip surgery   Arthritis Father    Kidney disease Father    Pneumonia Father    Heart disease Father    Kidney disease Sister        dialysis   Obesity Sister    Hypertension Sister    Kidney disease Sister        dialysis   Diabetes Brother    Heart disease Brother    Heart attack Brother    Alcohol abuse Brother    Throat cancer Brother    Lung cancer Brother    Pneumonia Brother    Lupus Daughter    Rheum arthritis Daughter    Sjogren's syndrome Daughter    Fibromyalgia Daughter    Hypertension Daughter    ALS Daughter    Luiz Blare'  disease Daughter    Hypertension Daughter    Anxiety disorder Maternal Uncle    Hypertension Maternal Grandfather    Alcohol abuse Other        parent   Ataxia Neg Hx    Chorea Neg Hx    Dementia Neg Hx    Mental retardation Neg Hx    Migraines Neg Hx    Multiple sclerosis Neg Hx    Neurofibromatosis Neg Hx    Neuropathy Neg Hx    Parkinsonism Neg Hx    Seizures Neg Hx    Stroke Neg Hx    Colon cancer Neg Hx    Colon polyps Neg Hx    Esophageal cancer Neg Hx    Rectal cancer Neg Hx    Stomach cancer Neg Hx     Social History   Socioeconomic History   Marital status: Widowed    Spouse name: Not on file   Number of children: 5   Years of education: Not on file   Highest education level: Not on file  Occupational History   Occupation: retired  Tobacco Use   Smoking status: Never   Smokeless tobacco: Never   Tobacco comments:    dtr 2 g-kids.  Vaping Use   Vaping status: Never Used  Substance and Sexual Activity   Alcohol use: No    Comment: rare   Drug use: No   Sexual activity: Not Currently  Other Topics Concern   Not on file  Social History Narrative   Right handed   One story with daughter and two grandkids   Social Determinants of Health   Financial Resource Strain: Low Risk  (06/04/2022)   Overall Financial Resource Strain (CARDIA)    Difficulty of Paying Living Expenses: Not hard at all  Food Insecurity: No Food Insecurity (06/04/2022)   Hunger Vital Sign    Worried About Running Out of Food in the Last Year: Never true    Ran Out of Food in the Last Year: Never true  Transportation Needs: No Transportation Needs (06/04/2022)   PRAPARE - Administrator, Civil Service (Medical): No    Lack of Transportation (Non-Medical): No  Physical Activity: Insufficiently Active (06/04/2022)   Exercise Vital Sign    Days of Exercise per Week: 4 days    Minutes of Exercise per Session: 30 min  Stress: No Stress Concern Present (06/04/2022)   [employer] of Occupational Health - Occupational Stress Questionnaire    Feeling of Stress : Not at all  Social Connections: Moderately Isolated (06/04/2022)   Social Connection and Isolation Panel [NHANES]    Frequency of Communication with Friends and Family: More than  three times a week    Frequency of Social Gatherings with Friends and Family: More than three times a week    Attends Religious Services: More than 4 times per year    Active Member of [employer] or Organizations: No    Attends Banker Meetings: Never    Marital Status: Divorced  Catering manager Violence: Not At Risk (06/04/2022)   Humiliation, Afraid, Rape, and Kick questionnaire    Fear of Current or Ex-Partner: No    Emotionally Abused: No    Physically Abused: No    Sexually Abused: No    Outpatient Medications Prior to Visit  Medication Sig Dispense Refill   albuterol (VENTOLIN HFA) 108 (90 Base) MCG/ACT inhaler INHALE 2 PUFFS BY MOUTH EVERY 6 HOURS AS NEEDED FOR WHEEZING FOR SHORTNESS OF BREATH 18 g 12   alendronate (FOSAMAX) 70 MG tablet Take 1 tablet (70 mg total) by mouth once a week. Take with a full glass of water on an empty stomach. 4 tablet 5   ALPRAZolam (XANAX) 0.5 MG tablet TAKE 1 TABLET BY MOUTH TWICE DAILY AS NEEDED FOR ANXIETY AND FOR SLEEP 20 tablet 0   aspirin EC 81 MG tablet Take 81 mg by mouth daily. Swallow whole.     azelastine (ASTELIN) 0.1 % nasal spray Place 2 sprays into both nostrils 2 (two) times daily as needed for rhinitis. 30 mL 5   azelastine (OPTIVAR) 0.05 % ophthalmic solution Place 1 drop into both eyes 2 (two) times daily as needed. 6 mL 5   b complex vitamins tablet Take 1 tablet by mouth daily.     benzonatate (TESSALON) 200 MG capsule Take 1 capsule (200 mg total) by mouth 2 (two) times daily as needed for cough. 20 capsule 0   Cholecalciferol (VITAMIN D) 50 MCG (2000 UT) tablet Take 2,000 Units by mouth daily.     clotrimazole-betamethasone (LOTRISONE) cream Apply 1  Application topically daily. 45 each 0   empagliflozin (JARDIANCE) 10 MG TABS tablet Take 1 tablet (10 mg total) by mouth daily before breakfast. 90 tablet 3   esomeprazole (NEXIUM) 40 MG capsule Take 1 capsule (40 mg total) by mouth daily. Please call [phone] to schedule an appointment for further refills. 90 capsule 0   fluticasone (FLONASE) 50 MCG/ACT nasal spray Use 1-2 sprays in each nostril once  a day as needed for stuffy nose. In the right nostril, point the applicator out toward the right ear. In the left nostril, point the applicator out toward the left ear 16 g 5   furosemide (LASIX) 20 MG tablet Take 1 tablet (20 mg total) by mouth 3 (three) times a week. Monday, Wednesday and Friday. 90 tablet 3   guaiFENesin (MUCINEX) 600 MG 12 hr tablet Take 2 tablets (1,200 mg total) by mouth 2 (two) times daily. 30 tablet 0   levocetirizine (XYZAL) 5 MG tablet Take 1 tablet (5 mg total) by mouth every evening. 30 tablet 5   montelukast (SINGULAIR) 10 MG tablet Take 1 tablet (10 mg total) by mouth at bedtime. 30 tablet 5   Multiple Vitamin (MULTIVITAMIN) tablet Take 1 tablet by mouth daily.     nitroGLYCERIN (NITROSTAT) 0.4 MG SL tablet Place 1 tablet (0.4 mg total) under the tongue every 5 (five) minutes as needed for chest pain. 25 tablet 1   oxybutynin (DITROPAN XL) 10 MG 24 hr tablet Take 1 tablet (10 mg total) by mouth at bedtime. 30 tablet 2   rosuvastatin (CRESTOR) 10 MG  tablet Take 1 tablet (10 mg total) by mouth daily. 90 tablet 3   spironolactone (ALDACTONE) 25 MG tablet Take 1 tablet (25 mg total) by mouth daily. 90 tablet 2   valsartan (DIOVAN) 320 MG tablet Take 1 tablet (320 mg total) by mouth daily. 90 tablet 3   VENTOLIN HFA 108 (90 Base) MCG/ACT inhaler Inhale 2 puffs into the lungs every 4 (four) hours as needed for wheezing or shortness of breath. 18 g 1   amoxicillin-clavulanate (AUGMENTIN) 875-125 MG tablet Take 1 tablet by mouth 2 (two) times daily. 20 tablet 0   EPINEPHrine  0.3 mg/0.3 mL IJ SOAJ injection Inject 0.3 mg into the muscle as needed for anaphylaxis. (Patient not taking: Reported on 07/08/2023) 2 each 1   No facility-administered medications prior to visit.    Allergies  Allergen Reactions   Codeine Nausea And Vomiting   Hydralazine Hcl Other (See Comments)    Pt reports causes dizziness   Lisinopril-Hydrochlorothiazide Hives    REACTION: lip swelling,facial swelling,rash.   Metoprolol Other (See Comments)    Does not feel well on it    Other Cough    Patient states she is allergic to Shrimp, egg whites, tomatoes and dairy products.  Says they make her feel unwell but do not cause hives, SOB or anaphylaxis.  She just avoids eating them because they can make her have a headache.  Strawberries, watermelon, peanuts, and all shellfish.    environmental   Shrimp [Shellfish Allergy] Other (See Comments)    Was allergy tested and showed shrimp was one--has not reacted b/c avoided    ROS     Objective:    Physical Exam  BP 130/74   Pulse 72   Temp 98.2 F (36.8 C) (Oral)   Resp 18   Ht 4\' 11"  (1.499 m)   Wt 229 lb 6.4 oz (104.1 kg)   SpO2 95%   BMI 46.33 kg/m  Wt Readings from Last 3 Encounters:  07/25/23 229 lb 6.4 oz (104.1 kg)  07/08/23 228 lb (103.4 kg)  05/01/23 224 lb 12.8 oz (102 kg)    Diabetic Foot Exam - Simple   No data filed    Lab Results  Component Value Date   WBC 10.1 03/05/2023   HGB 12.7 03/05/2023   HCT 39.4 03/05/2023   PLT 259.0 03/05/2023   GLUCOSE 115 (H) 06/12/2023   CHOL 111 03/05/2023   TRIG 44.0 03/05/2023   HDL 41.60 03/05/2023   LDLCALC 60 03/05/2023   ALT 20 03/05/2023   AST 19 03/05/2023   NA 140 06/12/2023   K 4.6 06/12/2023   CL 102 06/12/2023   CREATININE 1.12 (H) 06/12/2023   BUN 26 06/12/2023   CO2 25 06/12/2023   TSH 1.06 03/05/2023   INR 1.06 01/10/2015   HGBA1C 5.6 03/05/2023    Lab Results  Component Value Date   TSH 1.06 03/05/2023   Lab Results  Component Value  Date   WBC 10.1 03/05/2023   HGB 12.7 03/05/2023   HCT 39.4 03/05/2023   MCV 91.7 03/05/2023   PLT 259.0 03/05/2023   Lab Results  Component Value Date   NA 140 06/12/2023   K 4.6 06/12/2023   CO2 25 06/12/2023   GLUCOSE 115 (H) 06/12/2023   BUN 26 06/12/2023   CREATININE 1.12 (H) 06/12/2023   BILITOT 0.4 03/05/2023   ALKPHOS 69 03/05/2023   AST 19 03/05/2023   ALT 20 03/05/2023   PROT 7.2 03/05/2023  ALBUMIN 4.1 03/05/2023   CALCIUM 9.3 06/12/2023   ANIONGAP 9 09/15/2017   EGFR 51 (L) 06/12/2023   GFR 45.65 (L) 03/05/2023   Lab Results  Component Value Date   CHOL 111 03/05/2023   Lab Results  Component Value Date   HDL 41.60 03/05/2023   Lab Results  Component Value Date   LDLCALC 60 03/05/2023   Lab Results  Component Value Date   TRIG 44.0 03/05/2023   Lab Results  Component Value Date   CHOLHDL 3 03/05/2023   Lab Results  Component Value Date   HGBA1C 5.6 03/05/2023       Assessment & Plan:  Chronic heart failure with preserved ejection fraction (HCC) Assessment & Plan: Asymptomatic, continue to monitor   Essential hypertension Assessment & Plan: Well controlled, no changes to meds. Encouraged heart healthy diet such as the DASH diet and exercise as tolerated.   Orders: -     CBC with Differential/Platelet -     TSH  Nonintractable headache, unspecified chronicity pattern, unspecified headache type Assessment & Plan: Encouraged increased hydration, 64 ounces of clear fluids daily. Minimize alcohol and caffeine. Eat small frequent meals with lean proteins and complex carbs. Avoid high and low blood sugars. Get adequate sleep, 7-8 hours a night. Needs exercise daily preferably in the morning.   Orders: -     Lipid panel  Hyperglycemia Assessment & Plan: hgba1c acceptable, minimize simple carbs. Increase exercise as tolerated.   Orders: -     Comprehensive metabolic panel -     Hemoglobin A1c  Pure hypercholesterolemia Assessment &  Plan: Tolerating statin, encouraged heart healthy diet, avoid trans fats, minimize simple carbs and saturated fats. Increase exercise as tolerated. Is tolerating Rosuvastatin  Orders: -     Lipid panel  Osteoporosis, unspecified osteoporosis type, unspecified pathological fracture presence Assessment & Plan: Encouraged to get adequate exercise, calcium and vitamin d intake. Is tolerating Fosamax weekly, warned regarding possible side effects and asked to report them. Had forgotten some doses but is doing better now.   Vitamin D deficiency Assessment & Plan: Supplement and monitor  Orders: -     VITAMIN D 25 Hydroxy (Vit-D Deficiency, Fractures)  Preventative health care Assessment & Plan: Patient encouraged to maintain heart healthy diet, regular exercise, adequate sleep. Consider daily probiotics. Take medications as prescribed. Labs reviewed with patient, immunizations reviewed. Encouraged MGM declines for nwo will get one before the end of year. Colonoscopy in 2022 they will consider repeat scope in 2029 depending on patient health, due to advanced age may not repeat. Patient has aged out of and declines MGM, paps. Dexa was 2022 repeat in 1 to 2 years, she declines today but agrees to next year.    Food allergy Assessment & Plan: Receiving allergy shots for almost 4 years.     Assessment and Plan    Congestive Heart Failure Reports of increased fatigue and shortness of breath. No changes in medication regimen. -Continue current management.  Osteoporosis Adherence to medication has improved after forgetting a few doses. No new symptoms or side effects reported. -Continue current medication. -Plan for repeat bone density scan next year.  Diabetes Currently on Jardiance. Reports frequent urination as a side effect. -Continue current medication. -Encourage communication if side effects become intolerable.  Respiratory Infection Recent mucus production and congestion.  Symptoms have improved. -Continue current management.  General Health Maintenance -Update lab work today, including glucose and vitamin D levels. -Continue vitamin D supplementation. -Encourage hydration and regular  protein intake. -Plan for colonoscopy around age 70, as per previous recommendation due to precancerous polyp. -Discontinue routine mammograms and pap smears, as per patient preference and current guidelines.  Follow-up in six months.         Danise Edge, MD

## 2023-07-25 NOTE — Assessment & Plan Note (Signed)
Patient encouraged to maintain heart healthy diet, regular exercise, adequate sleep. Consider daily probiotics. Take medications as prescribed. Labs reviewed with patient, immunizations reviewed. Encouraged MGM declines for nwo will get one before the end of year. Colonoscopy in 2022 they will consider repeat scope in 2029 depending on patient health, due to advanced age may not repeat. Patient has aged out of and declines MGM, paps. Dexa was 2022 repeat in 1 to 2 years, she declines today but agrees to next year.

## 2023-07-26 LAB — COMPREHENSIVE METABOLIC PANEL
ALT: 30 U/L (ref 0–35)
AST: 24 U/L (ref 0–37)
Albumin: 4 g/dL (ref 3.5–5.2)
Alkaline Phosphatase: 73 U/L (ref 39–117)
BUN: 34 mg/dL — ABNORMAL HIGH (ref 6–23)
CO2: 29 meq/L (ref 19–32)
Calcium: 9.1 mg/dL (ref 8.4–10.5)
Chloride: 102 meq/L (ref 96–112)
Creatinine, Ser: 1.08 mg/dL (ref 0.40–1.20)
GFR: 50.12 mL/min — ABNORMAL LOW (ref >60.00)
Glucose, Bld: 75 mg/dL (ref 70–99)
Potassium: 4.4 meq/L (ref 3.5–5.1)
Sodium: 139 meq/L (ref 135–145)
Total Bilirubin: 0.5 mg/dL (ref 0.2–1.2)
Total Protein: 7 g/dL (ref 6.0–8.3)

## 2023-07-26 LAB — CBC WITH DIFFERENTIAL/PLATELET
Basophils Absolute: 0.1 10*3/uL (ref 0.0–0.1)
Basophils Relative: 0.7 % (ref 0.0–3.0)
Eosinophils Absolute: 0.4 10*3/uL (ref 0.0–0.7)
Eosinophils Relative: 4.1 % (ref 0.0–5.0)
HCT: 40.9 % (ref 36.0–46.0)
Hemoglobin: 13.2 g/dL (ref 12.0–15.0)
Lymphocytes Relative: 17 % (ref 12.0–46.0)
Lymphs Abs: 1.7 10*3/uL (ref 0.7–4.0)
MCHC: 32.3 g/dL (ref 30.0–36.0)
MCV: 91 fL (ref 78.0–100.0)
Monocytes Absolute: 0.9 10*3/uL (ref 0.1–1.0)
Monocytes Relative: 9.2 % (ref 3.0–12.0)
Neutro Abs: 7 10*3/uL (ref 1.4–7.7)
Neutrophils Relative %: 69 % (ref 43.0–77.0)
Platelets: 241 10*3/uL (ref 150.0–400.0)
RBC: 4.49 Mil/uL (ref 3.87–5.11)
RDW: 14.2 % (ref 11.5–15.5)
WBC: 10.1 10*3/uL (ref 4.0–10.5)

## 2023-07-26 LAB — LIPID PANEL
Cholesterol: 126 mg/dL (ref 0–200)
HDL: 40.5 mg/dL (ref >39.00)
LDL Cholesterol: 75 mg/dL (ref 0–99)
NonHDL: 85.01
Total CHOL/HDL Ratio: 3
Triglycerides: 48 mg/dL (ref 0.0–149.0)
VLDL: 9.6 mg/dL (ref 0.0–40.0)

## 2023-07-26 LAB — TSH: TSH: 1.19 u[IU]/mL (ref 0.35–5.50)

## 2023-07-26 LAB — VITAMIN D 25 HYDROXY (VIT D DEFICIENCY, FRACTURES): VITD: 40.82 ng/mL (ref 30.00–100.00)

## 2023-07-26 LAB — HEMOGLOBIN A1C: Hgb A1c MFr Bld: 5.4 % (ref 4.6–6.5)

## 2023-08-29 ENCOUNTER — Ambulatory Visit (INDEPENDENT_AMBULATORY_CARE_PROVIDER_SITE_OTHER): Payer: Medicare PPO | Admitting: *Deleted

## 2023-08-29 ENCOUNTER — Other Ambulatory Visit: Payer: Self-pay

## 2023-08-29 DIAGNOSIS — J309 Allergic rhinitis, unspecified: Secondary | ICD-10-CM | POA: Diagnosis not present

## 2023-09-07 ENCOUNTER — Other Ambulatory Visit: Payer: Self-pay | Admitting: Allergy

## 2023-09-10 ENCOUNTER — Ambulatory Visit: Payer: Medicare PPO | Admitting: Podiatry

## 2023-09-11 IMAGING — DX DG NECK SOFT TISSUE
2 series · 2 of 2 positions shown · non-contrast
Comparison: None Available.

CLINICAL DATA: Neck pain

EXAM:
NECK SOFT TISSUES - 1+ VIEW

[neck lat]
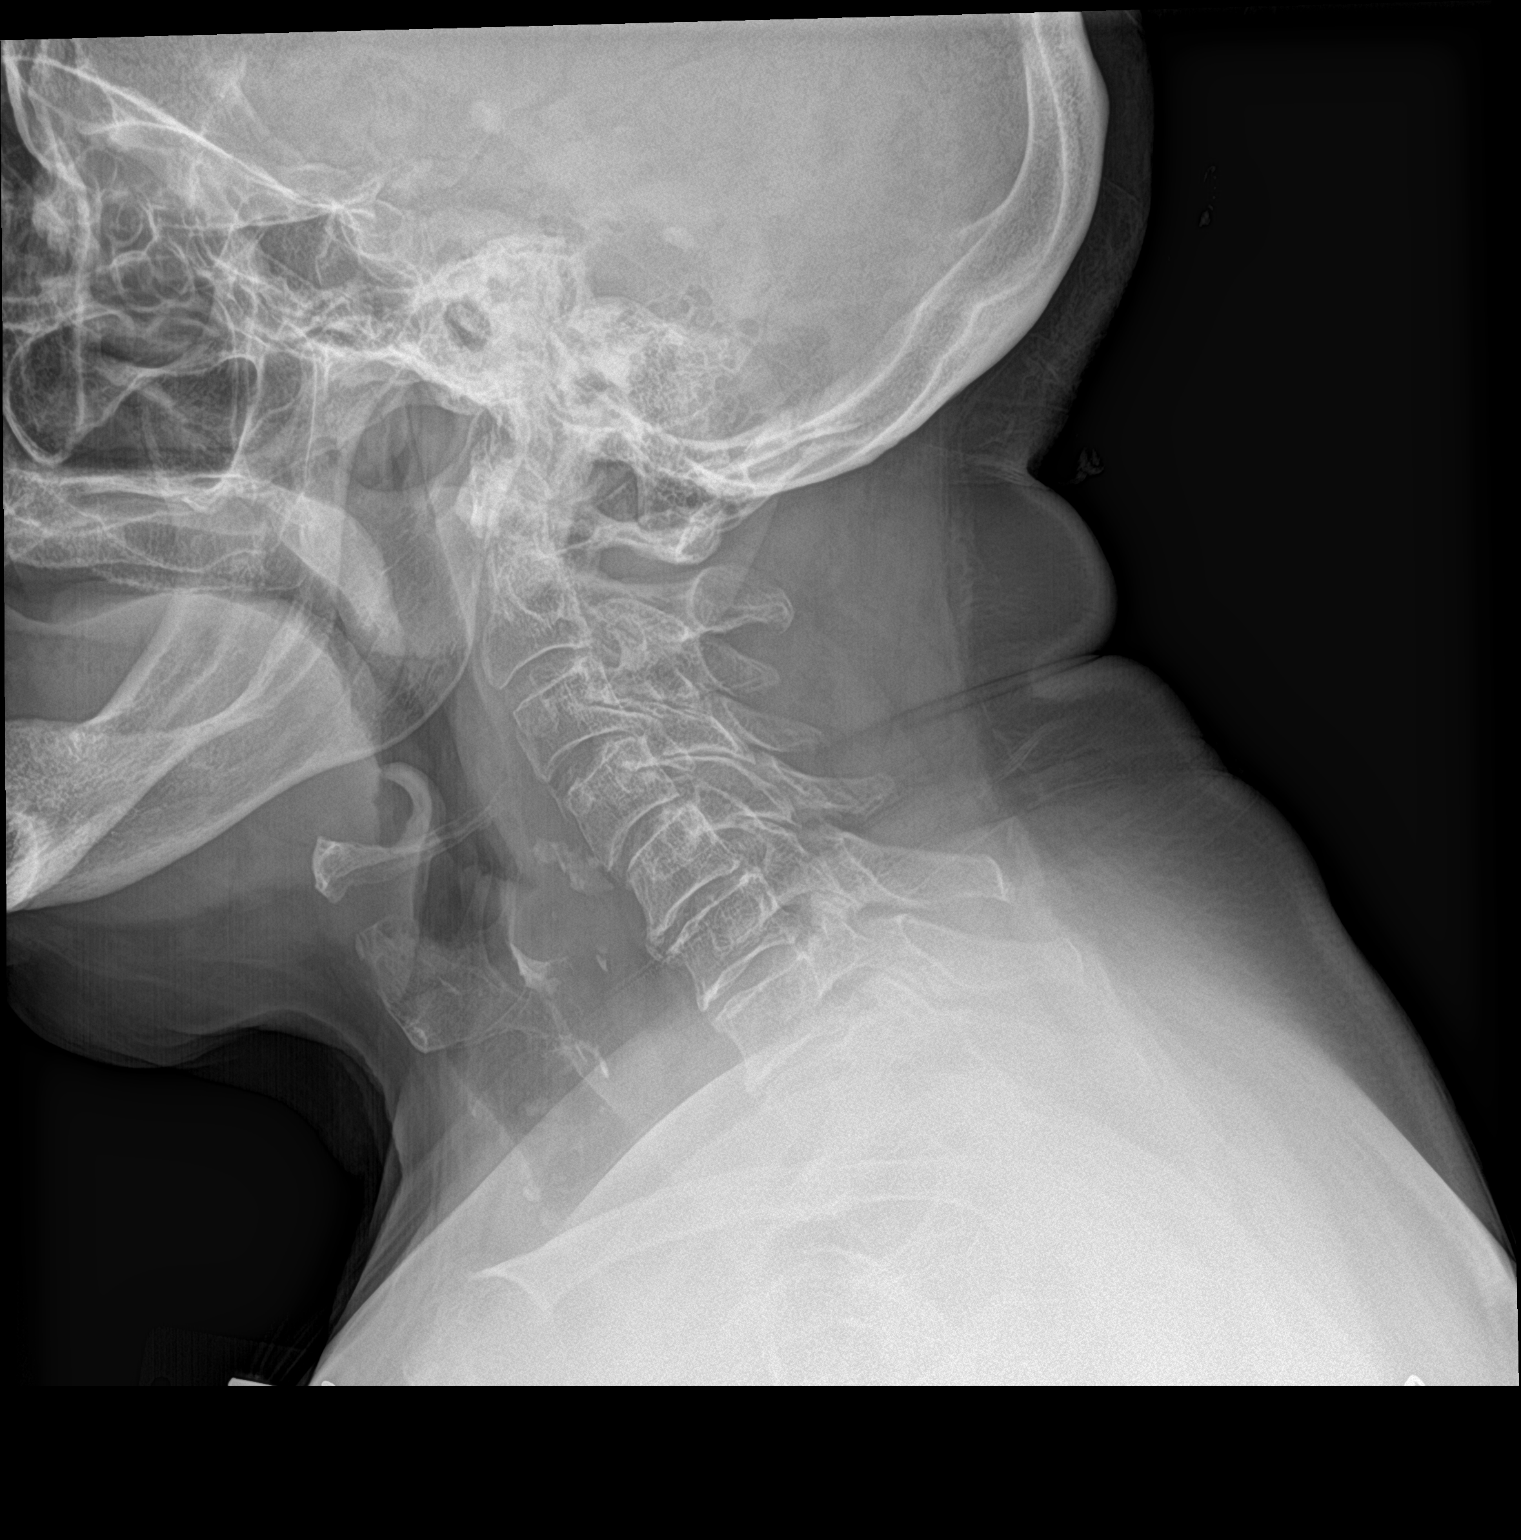

[neck ap]
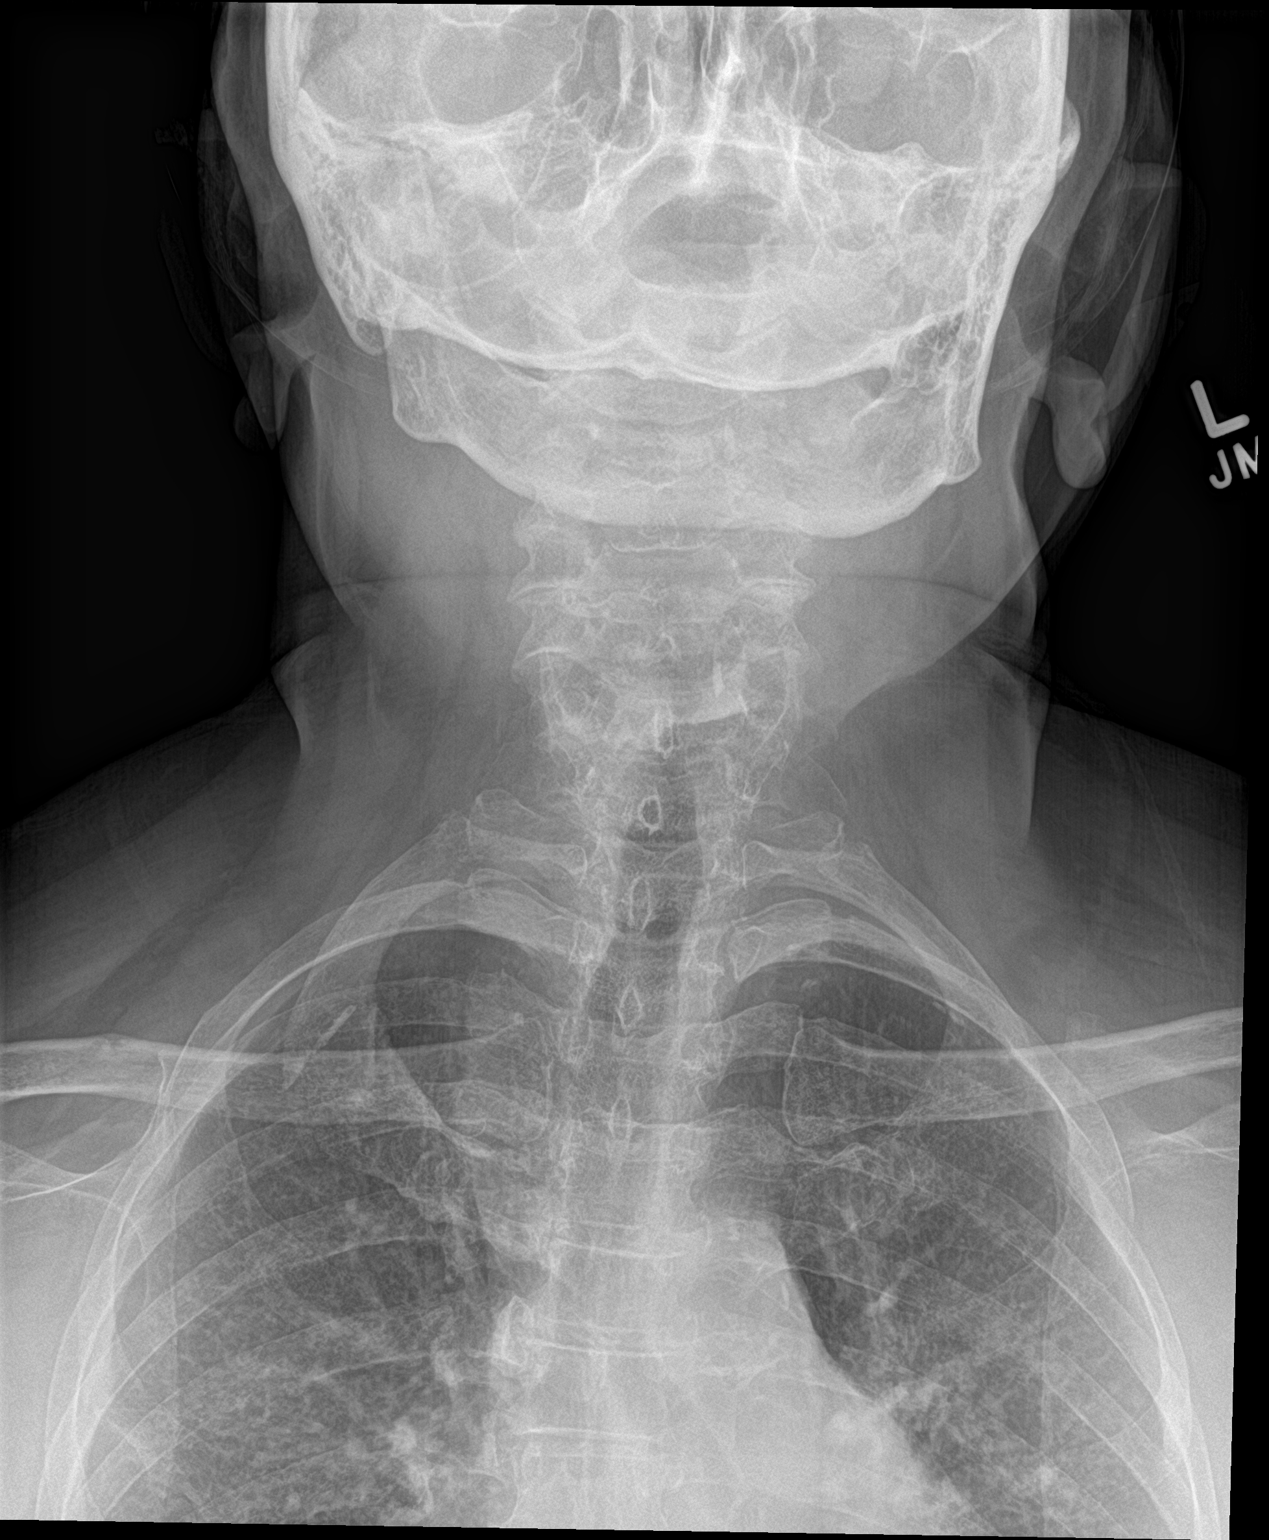

[2 of 2 positions shown; findings below may reference images not displayed]

FINDINGS: There is no evidence of retropharyngeal soft tissue swelling or
epiglottic enlargement. The cervical airway is unremarkable and no
radio-opaque foreign body identified. Straightening of the cervical
spine with mild multilevel disc space narrowing and osteophyte.
IMPRESSION: Negative.

## 2023-09-23 ENCOUNTER — Ambulatory Visit: Payer: Self-pay | Admitting: Family Medicine

## 2023-09-23 NOTE — Assessment & Plan Note (Signed)
hgba1c acceptable, minimize simple carbs. Increase exercise as tolerated.  

## 2023-09-23 NOTE — Assessment & Plan Note (Signed)
Asymptomatic, continue to monitor.

## 2023-09-23 NOTE — Assessment & Plan Note (Signed)
Encouraged to get adequate exercise, calcium and vitamin d intake 

## 2023-09-23 NOTE — Assessment & Plan Note (Signed)
Tolerating statin, encouraged heart healthy diet, avoid trans fats, minimize simple carbs and saturated fats. Increase exercise as tolerated. Is tolerating Rosuvastatin

## 2023-09-23 NOTE — Assessment & Plan Note (Signed)
Supplement and monitor 

## 2023-09-23 NOTE — Assessment & Plan Note (Signed)
Uses CPAP 

## 2023-09-23 NOTE — Telephone Encounter (Signed)
Appt tomorrow.

## 2023-09-23 NOTE — Telephone Encounter (Signed)
Copied from CRM [id number]. Topic: Clinical - Red Word Triage >> Sep 23, 2023  8:33 AM Anne Barnes wrote: Red Word that prompted transfer to Nurse Triage: Pt has experienced elevated blood pressure since Friday, with highest reading of 196/84. Also has left side head pain with elevated pressure. Has history of congestive heart failure   Chief Complaint: elevated blood pressure Symptoms: headache Frequency: ongoing since Friday Pertinent Negatives: Patient denies visual changes Disposition: [] ED /[] Urgent Care (no appt availability in office) / [x] Appointment(In office/virtual)/ []  [facility]/ [] Home Care/ [] Refused Recommended Disposition /[] [facility]/ []  Follow-up with PCP Additional Notes: The patient stated that she has not been taking her blood pressure regularly but Friday she noticed a headache on her left side and she took her blood pressure and it was 196/84, this was her highest reading this weekend.  The pain in her head comes and goes but she noted that her blood pressure is elevated when her head is hurting.  Yesterday her blood pressure was 174/82 and her head was hurting again.  She does have history of cluster headaches and thought this may be related however, her systolic bp is normally 120-130's.  Her current bp is 158/77 right arm; 153/72 left arm heart rate 66 after taking bp medicine.  She prefers Dr. Abner Greenspan to manage her blood pressure medication.  Please contact patient with bp medication dosage changes if any are recommended prior to appointment tomorrow morning.   Reason for Disposition  Systolic BP  >= 160 OR Diastolic >= 100  Answer Assessment - Initial Assessment Questions 1. BLOOD PRESSURE: "What is the blood pressure?" "Did you take at least two measurements 5 minutes apart?"     0815  Right arm 158/77  Left arm 153/72 HR 66 O2 98  Normal range 126/61 - 139/72   2. ONSET: "When did you take your blood pressure?"     Friday night left side of  head started hurting  3. HOW: "How did you take your blood pressure?" (e.g., automatic home BP monitor, visiting nurse)     Automatic  4. HISTORY: "Do you have a history of high blood pressure?"     yes 5. MEDICINES: "Are you taking any medicines for blood pressure?" "Have you missed any doses recently?"     No  6. OTHER SYMPTOMS: "Do you have any symptoms?" (e.g., blurred vision, chest pain, difficulty breathing, headache, weakness)     Headache - pain in temple and behind ear  Protocols used: Blood Pressure - High-A-AH

## 2023-09-23 NOTE — Assessment & Plan Note (Signed)
Numbers have been elevated recently. Encouraged heart healthy diet such as the DASH diet and exercise as tolerated.

## 2023-09-24 ENCOUNTER — Ambulatory Visit (INDEPENDENT_AMBULATORY_CARE_PROVIDER_SITE_OTHER): Payer: Medicare PPO | Admitting: Family Medicine

## 2023-09-24 VITALS — BP 156/76 | HR 57 | Temp 98.0°F | Resp 16 | Ht 59.0 in | Wt 224.8 lb

## 2023-09-24 DIAGNOSIS — R739 Hyperglycemia, unspecified: Secondary | ICD-10-CM

## 2023-09-24 DIAGNOSIS — G4733 Obstructive sleep apnea (adult) (pediatric): Secondary | ICD-10-CM

## 2023-09-24 DIAGNOSIS — I5032 Chronic diastolic (congestive) heart failure: Secondary | ICD-10-CM

## 2023-09-24 DIAGNOSIS — M810 Age-related osteoporosis without current pathological fracture: Secondary | ICD-10-CM

## 2023-09-24 DIAGNOSIS — I10 Essential (primary) hypertension: Secondary | ICD-10-CM | POA: Diagnosis not present

## 2023-09-24 DIAGNOSIS — E559 Vitamin D deficiency, unspecified: Secondary | ICD-10-CM

## 2023-09-24 DIAGNOSIS — E7800 Pure hypercholesterolemia, unspecified: Secondary | ICD-10-CM

## 2023-09-24 LAB — COMPREHENSIVE METABOLIC PANEL
ALT: 18 U/L (ref 0–35)
AST: 18 U/L (ref 0–37)
Albumin: 4.3 g/dL (ref 3.5–5.2)
Alkaline Phosphatase: 73 U/L (ref 39–117)
BUN: 24 mg/dL — ABNORMAL HIGH (ref 6–23)
CO2: 29 meq/L (ref 19–32)
Calcium: 9.3 mg/dL (ref 8.4–10.5)
Chloride: 103 meq/L (ref 96–112)
Creatinine, Ser: 1.06 mg/dL (ref 0.40–1.20)
GFR: 51.2 mL/min — ABNORMAL LOW (ref >60.00)
Glucose, Bld: 89 mg/dL (ref 70–99)
Potassium: 4.6 meq/L (ref 3.5–5.1)
Sodium: 140 meq/L (ref 135–145)
Total Bilirubin: 0.5 mg/dL (ref 0.2–1.2)
Total Protein: 7.4 g/dL (ref 6.0–8.3)

## 2023-09-24 MED ORDER — AMLODIPINE BESYLATE 2.5 MG PO TABS: 2.5000 mg | ORAL_TABLET | Freq: Every day | ORAL | 3 refills | Status: AC

## 2023-09-24 NOTE — Patient Instructions (Signed)
 Hypertension, Adult High blood pressure (hypertension) is when the force of blood pumping through the arteries is too strong. The arteries are the blood vessels that carry blood from the heart throughout the body. Hypertension forces the heart to work harder to pump blood and may cause arteries to become narrow or stiff. Untreated or uncontrolled hypertension can lead to a heart attack, heart failure, a stroke, kidney disease, and other problems. A blood pressure reading consists of a higher number over a lower number. Ideally, your blood pressure should be below 120/80. The first ("top") number is called the systolic pressure. It is a measure of the pressure in your arteries as your heart beats. The second ("bottom") number is called the diastolic pressure. It is a measure of the pressure in your arteries as the heart relaxes. What are the causes? The exact cause of this condition is not known. There are some conditions that result in high blood pressure. What increases the risk? Certain factors may make you more likely to develop high blood pressure. Some of these risk factors are under your control, including: Smoking. Not getting enough exercise or physical activity. Being overweight. Having too much fat, sugar, calories, or salt (sodium) in your diet. Drinking too much alcohol. Other risk factors include: Having a personal history of heart disease, diabetes, high cholesterol, or kidney disease. Stress. Having a family history of high blood pressure and high cholesterol. Having obstructive sleep apnea. Age. The risk increases with age. What are the signs or symptoms? High blood pressure may not cause symptoms. Very high blood pressure (hypertensive crisis) may cause: Headache. Fast or irregular heartbeats (palpitations). Shortness of breath. Nosebleed. Nausea and vomiting. Vision changes. Severe chest pain, dizziness, and seizures. How is this diagnosed? This condition is diagnosed by  measuring your blood pressure while you are seated, with your arm resting on a flat surface, your legs uncrossed, and your feet flat on the floor. The cuff of the blood pressure monitor will be placed directly against the skin of your upper arm at the level of your heart. Blood pressure should be measured at least twice using the same arm. Certain conditions can cause a difference in blood pressure between your right and left arms. If you have a high blood pressure reading during one visit or you have normal blood pressure with other risk factors, you may be asked to: Return on a different day to have your blood pressure checked again. Monitor your blood pressure at home for 1 week or longer. If you are diagnosed with hypertension, you may have other blood or imaging tests to help your health care provider understand your overall risk for other conditions. How is this treated? This condition is treated by making healthy lifestyle changes, such as eating healthy foods, exercising more, and reducing your alcohol intake. You may be referred for counseling on a healthy diet and physical activity. Your health care provider may prescribe medicine if lifestyle changes are not enough to get your blood pressure under control and if: Your systolic blood pressure is above 130. Your diastolic blood pressure is above 80. Your personal target blood pressure may vary depending on your medical conditions, your age, and other factors. Follow these instructions at home: Eating and drinking  Eat a diet that is high in fiber and potassium, and low in sodium, added sugar, and fat. An example of this eating plan is called the DASH diet. DASH stands for Dietary Approaches to Stop Hypertension. To eat this way: Eat  plenty of fresh fruits and vegetables. Try to fill one half of your plate at each meal with fruits and vegetables. Eat whole grains, such as whole-wheat pasta, brown rice, or whole-grain bread. Fill about one  fourth of your plate with whole grains. Eat or drink low-fat dairy products, such as skim milk or low-fat yogurt. Avoid fatty cuts of meat, processed or cured meats, and poultry with skin. Fill about one fourth of your plate with lean proteins, such as fish, chicken without skin, beans, eggs, or tofu. Avoid pre-made and processed foods. These tend to be higher in sodium, added sugar, and fat. Reduce your daily sodium intake. Many people with hypertension should eat less than 1,500 mg of sodium a day. Do not drink alcohol if: Your health care provider tells you not to drink. You are pregnant, may be pregnant, or are planning to become pregnant. If you drink alcohol: Limit how much you have to: 0-1 drink a day for women. 0-2 drinks a day for men. Know how much alcohol is in your drink. In the [place] one drink equals one 12 oz bottle of beer (355 mL), one 5 oz glass of wine (148 mL), or one 1 oz glass of hard liquor (44 mL). Lifestyle  Work with your health care provider to maintain a healthy body weight or to lose weight. Ask what an ideal weight is for you. Get at least 30 minutes of exercise that causes your heart to beat faster (aerobic exercise) most days of the week. Activities may include walking, swimming, or biking. Include exercise to strengthen your muscles (resistance exercise), such as Pilates or lifting weights, as part of your weekly exercise routine. Try to do these types of exercises for 30 minutes at least 3 days a week. Do not use any products that contain nicotine or tobacco. These products include cigarettes, chewing tobacco, and vaping devices, such as e-cigarettes. If you need help quitting, ask your health care provider. Monitor your blood pressure at home as told by your health care provider. Keep all follow-up visits. This is important. Medicines Take over-the-counter and prescription medicines only as told by your health care provider. Follow directions carefully. Blood  pressure medicines must be taken as prescribed. Do not skip doses of blood pressure medicine. Doing this puts you at risk for problems and can make the medicine less effective. Ask your health care provider about side effects or reactions to medicines that you should watch for. Contact a health care provider if you: Think you are having a reaction to a medicine you are taking. Have headaches that keep coming back (recurring). Feel dizzy. Have swelling in your ankles. Have trouble with your vision. Get help right away if you: Develop a severe headache or confusion. Have unusual weakness or numbness. Feel faint. Have severe pain in your chest or abdomen. Vomit repeatedly. Have trouble breathing. These symptoms may be an emergency. Get help right away. Call 911. Do not wait to see if the symptoms will go away. Do not drive yourself to the hospital. Summary Hypertension is when the force of blood pumping through your arteries is too strong. If this condition is not controlled, it may put you at risk for serious complications. Your personal target blood pressure may vary depending on your medical conditions, your age, and other factors. For most people, a normal blood pressure is less than 120/80. Hypertension is treated with lifestyle changes, medicines, or a combination of both. Lifestyle changes include losing weight, eating a healthy,  low-sodium diet, exercising more, and limiting alcohol. This information is not intended to replace advice given to you by your health care provider. Make sure you discuss any questions you have with your health care provider. Document Revised: 07/18/2021 Document Reviewed: 07/18/2021 Elsevier Patient Education  2024 [employer].

## 2023-09-26 NOTE — Progress Notes (Signed)
 HPI female never smoker followed for rhinitis/allergies, asthma, OSA, CHF, HTN, GERD, IBS, Peanut  and cat cause coughing and we advised her to avoid them Allergy  profile 03/11/14- positive especially for dust, cat and dog Food allergy  profile- total IgE 86 with several food group elevations Sed rate-WNL Office Spirometry 11/07/17-WNL-FVC 1.58/82%, FEV1 1.27/85%, ratio 0.80, FEF 25-75% 0.26/89% HST 12/05/2017-AHI 12.3/hour, desaturation to 82%, body weight 226 pounds ----------------------------------------------------------------------------------------   09/27/22- 78 year old female never smoker followed for  OSA,  Complicated by CHF, HTN, GERD, IBS, Chiari Malformation,  Rhinitis/Allergies, Asthma, CPAP auto 5-12/ [facility]/ [facility]                                          [facility]ergy  [facility] [place] Download- compliance 93%, AHI 3.4/ hr Body weight today- -----Albuterol  hfa, singulair ,  Covid vax- 3 Phizer Flu vax-declines Download reviewed.  She says she is comfortable with her CPAP. Asks lower pressure Has seen a speech therapist for LPR Occasional dizziness for which she blames allergy .  She continues to take allergy  vaccine from her allergy  office. No significant wheezing.  09/27/23- 78 year old female never smoker followed for  OSA,  Complicated by CHF, HTN, GERD, IBS, Chiari Malformation,  Rhinitis/Allergies, Asthma, CPAP auto 4-8/ [facility]/ [facility]                                          [facility]ergy  [facility]  Download- compliance 87%, AHI 3.1/hr Body weight today-225 lbs -----Problems with increased blood pressure.  SOB with exertion persistent. Discussed the use of AI scribe software for clinical note transcription with the patient, who gave verbal consent to proceed.  History of Present Illness   The patient, with a history of congestive heart failure (CHF), sleep apnea, and allergies, presents with multiple health concerns. She reports feeling tired due to  poor sleep and frequent urination, which she attributes to her CHF and the medication Jardiance . She also mentions drinking large amounts of water and tea, which contributes to her frequent urination. Told to drink fluids to help her kidneys.  The patient's sleep apnea is currently managed with a CPAP machine, which she has been using since 2019. She reports that the machine has been effective in controlling her sleep apnea, but she is due for a replacement.  In addition to her CHF and sleep apnea, the patient is also dealing with allergies. She is currently in her fourth year of allergy  injections, which have improved her symptoms, but not completely resolved them.  The patient also mentions a recent personal loss, having lost a daughter within the past year. This emotional stress may be contributing to her overall health and well-being.      ROS-see HPI  + = positive Constitutional:   No-   weight loss, night sweats, fevers, chills, fatigue, lassitude. HEENT:   +headaches, difficulty swallowing, tooth/dental problems, sore throat,       No-  sneezing, itching, ear ache, nasal congestion, +post nasal drip,  CV:  No-   chest pain, orthopnea, PND, swelling in lower extremities, anasarca,  +dizziness, palpitations Resp: +shortness of breath with exertion or at rest.              productive cough,  No non-productive cough,  No- coughing up of  blood.              No-   change in color of mucus.  No- wheezing.   Skin: No-   rash or lesions. GI:  No-   heartburn, indigestion, abdominal pain, nausea, vomiting,  GU: . MS:  No-   joint pain or swelling.   Neuro-     nothing unusual Psych:  No- change in mood or affect. No depression or anxiety.  No memory loss.  OBJ- Physical Exam General- Alert, Oriented, Affect-appropriate, Distress- none acute, + obese Skin- rash-none, lesions- none, excoriation- none Lymphadenopathy- none Head- atraumatic            Eyes- Gross vision intact, PERRLA,  conjunctivae and secretions clear            Ears- +Hard of hearing            Nose- + turbinate edema, no-Septal dev, mucus, polyps, erosion, perforation             Throat- Mallampati III-IV , mucosa clear , drainage- none, tonsils- atrophic, +Dentures Neck- flexible , trachea midline, no stridor , thyroid  nl, carotid no bruit Chest - symmetrical excursion , unlabored           Heart/CV- RRR , no murmur , no gallop  , no rub, nl s1 s2                           - JVD- none , edema- none, stasis changes- none, varices- none           Lung- wheeze-none, cough none, dullness-none, rub- none           Chest wall-  Abd- Br/ Gen/ Rectal- Not done, not indicated Extrem- cyanosis- none, clubbing, none, atrophy- none, strength- nl Neuro- grossly intact to observation  Assessment and Plan    Obstructive Sleep Apnea Patient reports good control with current CPAP settings. Discussed the option for machine replacement after 5 years. -Order replacement CPAP machine with current settings through [facility].  Congestive Heart Failure Patient reports frequent urination due to CHF and Jardiance  use. No acute distress reported. -Continue current management with primary care physician and cardiologist.  Allergies Patient is in the fourth year of allergy  injections and reports some improvement, but not complete control. -Continue current management with [facility].  Cataracts Patient reports needing cataract surgery, which was delayed due to insurance issues. -Encourage patient to proceed with cataract surgery now that copay issue is resolved.  Hypertension Patient reports monitoring blood pressure at home and making dietary adjustments as needed. Next appointment with primary care physician scheduled for 10/23/2023. -Continue current management with primary care physician.

## 2023-09-27 ENCOUNTER — Ambulatory Visit (INDEPENDENT_AMBULATORY_CARE_PROVIDER_SITE_OTHER): Payer: Medicare PPO | Admitting: Internal Medicine

## 2023-09-27 ENCOUNTER — Encounter: Payer: Self-pay | Admitting: Internal Medicine

## 2023-09-27 VITALS — BP 126/64 | HR 70 | Temp 98.2°F | Ht 59.0 in | Wt 225.2 lb

## 2023-09-27 DIAGNOSIS — G4733 Obstructive sleep apnea (adult) (pediatric): Secondary | ICD-10-CM | POA: Diagnosis not present

## 2023-09-27 NOTE — Assessment & Plan Note (Signed)
 Benefits from CPAP with god compliance and control Plan- replace old machine, continuing auto 4-8

## 2023-09-27 NOTE — Patient Instructions (Signed)
 You are doing well with CPAP  Order- DME [facility]- please replace old CPAP machine, auto 4-8, mask of choice, humidifier, supplies, airView/ card  Please call if we can help

## 2023-09-29 ENCOUNTER — Encounter: Payer: Self-pay | Admitting: Family Medicine

## 2023-09-29 NOTE — Progress Notes (Signed)
 Subjective:    Patient ID: Anne Barnes, female    DOB: 22-Nov-1945, 78 y.o.   MRN: [id number]  Chief Complaint  Patient presents with  . Headache    Elevated blood pressure    HPI Discussed the use of AI scribe software for clinical note transcription with the patient, who gave verbal consent to proceed.  History of Present Illness   The patient, with a history of hypertension, presents with complaints of lightheadedness that has been persisting for a couple of weeks. They report an episode of chest pain about three weeks ago, which required two doses of nitroglycerin  for relief. The patient also mentions experiencing headaches, particularly on the left side of the head, which started about a week ago. The headaches are described as shooting pains that come and go, with occasional cluster headaches in the forehead area. The patient has been monitoring their blood pressure at home, with the highest reading reported as 196/84. They have been adjusting their medication dosage in response to these readings, taking an extra half dose when the blood pressure is high. The patient also mentions having cataracts in the left eye and hearing issues, which they believe are contributing to their balance issues.        Past Medical History:  Diagnosis Date  . Acute combined systolic and diastolic heart failure (HCC) 05/26/2015  . ALLERGIC RHINITIS   . Allergic urticaria 08/01/2014   Patient reports allergies to shrimp, egg whites, tomatoes, dairy   . Allergy     seasonal and numerous food and drug allergies.  . 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   Office Spirometry 11/07/17-WNL-FVC 1.58/82%, FEV1 1.27/85%, ratio 0.80, FEF 25-75% 0.26/89%  . Bradycardia 02/25/2017  . CAD (coronary artery disease) 05/01/2023   Myoview  03/26/2017: No ischemia Non-obs dz - CCTA 10/31/2019: CAC score 127; RCA proximal and mid 25-49, LAD proximal and mid 0-25   . Chiari malformation    Noted MRI brain 09/2013 - s/p  neuro eval for same  . DIVERTICULOSIS, COLON   . Essential hypertension 08/28/2010   Qualifier: Diagnosis of  By: Wilhemina RMA, Lucy     . Frequent headaches   . GERD   . H/O measles   . H/O mumps   . Hearing loss 01/18/2017  . History of chicken pox   . Hyperglycemia 03/05/2016  . HYPERTENSION   . Hypocalcemia 02/25/2017  . IBS (irritable bowel syndrome) 05/26/2015  . Nonspecific abnormal electrocardiogram (ECG) (EKG)   . OBSTRUCTIVE SLEEP APNEA 12/2008 dx   noncompliant with CPAP qhs  . Obstructive sleep apnea 08/28/2010   NPSG 12/2008:  AHI 13/hr with desats to 78%   . Preventative health care 06/11/2017  . Sciatica    right side  . Seasonal and perennial allergic rhinitis 08/28/2010   Allergy  profile 03/11/14- positive especially for dust, cat and dog Food allergy  profile- total IgE 86 with several food group elevations Sed rate-WNL     . Sinusitis 06/06/2017  . Sleep apnea    uses cpap  . 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 Vaginitis 01/18/2017  . Vitamin D  deficiency 06/11/2017    Past Surgical History:  Procedure Laterality Date  . ABDOMINAL HYSTERECTOMY  1986  . BIOPSY  07/19/2022   Procedure: BIOPSY;  Surgeon: San Sandor GAILS, DO;  Location: [facility];  Service: Gastroenterology;;  . BREAST SURGERY  1973   Breast biopsy  . COLONOSCOPY  2011   Dr Kristie  . ESOPHAGOGASTRODUODENOSCOPY (EGD) WITH PROPOFOL  N/A 07/19/2022  Procedure: ESOPHAGOGASTRODUODENOSCOPY (EGD) WITH PROPOFOL ;  Surgeon: San Sandor GAILS, DO;  Location: [facility];  Service: Gastroenterology;  Laterality: N/A;  . UMBILICAL HERNIA REPAIR      Family History  Problem Relation Age of Onset  . Arthritis Mother        died of complications from hip surgery  . Arthritis Father   . Kidney disease Father   . Pneumonia Father   . Heart disease Father   . Kidney disease Sister        dialysis  . Obesity Sister   . Hypertension Sister   . Kidney disease Sister        dialysis  . Diabetes  Brother   . Heart disease Brother   . Heart attack Brother   . Alcohol abuse Brother   . Throat cancer Brother   . Lung cancer Brother   . Pneumonia Brother   . Lupus Daughter   . Rheum arthritis Daughter   . Sjogren's syndrome Daughter   . Fibromyalgia Daughter   . Hypertension Daughter   . ALS Daughter   . Graves' disease Daughter   . Hypertension Daughter   . Anxiety disorder Maternal Uncle   . Hypertension Maternal Grandfather   . Alcohol abuse Other        parent  . Ataxia Neg Hx   . Chorea Neg Hx   . Dementia Neg Hx   . Mental retardation Neg Hx   . Migraines Neg Hx   . Multiple sclerosis Neg Hx   . Neurofibromatosis Neg Hx   . Neuropathy Neg Hx   . Parkinsonism Neg Hx   . Seizures Neg Hx   . Stroke Neg Hx   . Colon cancer Neg Hx   . Colon polyps Neg Hx   . Esophageal cancer Neg Hx   . Rectal cancer Neg Hx   . Stomach cancer Neg Hx     Social History   Socioeconomic History  . Marital status: Widowed    Spouse name: Not on file  . Number of children: 5  . Years of education: Not on file  . Highest education level: Not on file  Occupational History  . Occupation: retired  Tobacco Use  . Smoking status: Never  . Smokeless tobacco: Never  . Tobacco comments:    dtr 2 g-kids.  Vaping Use  . Vaping status: Never Used  Substance and Sexual Activity  . Alcohol use: No    Comment: rare  . Drug use: No  . Sexual activity: Not Currently  Other Topics Concern  . Not on file  Social History Narrative   Right handed   One story with daughter and two grandkids   Social Drivers of Health   Financial Resource Strain: Low Risk  (06/04/2022)   Overall Financial Resource Strain (CARDIA)   . Difficulty of Paying Living Expenses: Not hard at all  Food Insecurity: No Food Insecurity (06/04/2022)   Hunger Vital Sign   . Worried About Programme Researcher, Broadcasting/film/video in the Last Year: Never true   . Ran Out of Food in the Last Year: Never true  Transportation Needs: No  Transportation Needs (06/04/2022)   PRAPARE - Transportation   . Lack of Transportation (Medical): No   . Lack of Transportation (Non-Medical): No  Physical Activity: Insufficiently Active (06/04/2022)   Exercise Vital Sign   . Days of Exercise per Week: 4 days   . Minutes of Exercise per Session: 30 min  Stress: No Stress  Concern Present (06/04/2022)   [employer] of Occupational Health - Occupational Stress Questionnaire   . Feeling of Stress : Not at all  Social Connections: Moderately Isolated (06/04/2022)   Social Connection and Isolation Panel [NHANES]   . Frequency of Communication with Friends and Family: More than three times a week   . Frequency of Social Gatherings with Friends and Family: More than three times a week   . Attends Religious Services: More than 4 times per year   . Active Member of Clubs or Organizations: No   . Attends Banker Meetings: Never   . Marital Status: Divorced  Catering Manager Violence: Not At Risk (06/04/2022)   Humiliation, Afraid, Rape, and Kick questionnaire   . Fear of Current or Ex-Partner: No   . Emotionally Abused: No   . Physically Abused: No   . Sexually Abused: No    Outpatient Medications Prior to Visit  Medication Sig Dispense Refill  . albuterol  (VENTOLIN  HFA) 108 (90 Base) MCG/ACT inhaler INHALE 2 PUFFS BY MOUTH EVERY 6 HOURS AS NEEDED FOR WHEEZING FOR SHORTNESS OF BREATH 18 g 12  . alendronate  (FOSAMAX ) 70 MG tablet Take 1 tablet (70 mg total) by mouth once a week. Take with a full glass of water on an empty stomach. 4 tablet 5  . ALPRAZolam  (XANAX ) 0.5 MG tablet TAKE 1 TABLET BY MOUTH TWICE DAILY AS NEEDED FOR ANXIETY AND FOR SLEEP 20 tablet 0  . aspirin  EC 81 MG tablet Take 81 mg by mouth daily. Swallow whole.    . azelastine  (ASTELIN ) 0.1 % nasal spray Place 2 sprays into both nostrils 2 (two) times daily as needed for rhinitis. 30 mL 5  . azelastine  (OPTIVAR ) 0.05 % ophthalmic solution Place 1 drop into both  eyes 2 (two) times daily as needed. 6 mL 5  . b complex vitamins tablet Take 1 tablet by mouth daily.    . Cholecalciferol (VITAMIN D ) 50 MCG (2000 UT) tablet Take 2,000 Units by mouth daily.    . empagliflozin  (JARDIANCE ) 10 MG TABS tablet Take 1 tablet (10 mg total) by mouth daily before breakfast. 90 tablet 3  . fluticasone  (FLONASE ) 50 MCG/ACT nasal spray Use 1-2 sprays in each nostril once  a day as needed for stuffy nose. In the right nostril, point the applicator out toward the right ear. In the left nostril, point the applicator out toward the left ear 16 g 5  . furosemide  (LASIX ) 20 MG tablet Take 1 tablet (20 mg total) by mouth 3 (three) times a week. Monday, Wednesday and Friday. 90 tablet 3  . levocetirizine (XYZAL ) 5 MG tablet TAKE 1 TABLET BY MOUTH ONCE DAILY IN THE EVENING 30 tablet 5  . montelukast  (SINGULAIR ) 10 MG tablet Take 1 tablet (10 mg total) by mouth at bedtime. 30 tablet 5  . Multiple Vitamin (MULTIVITAMIN) tablet Take 1 tablet by mouth daily.    . nitroGLYCERIN  (NITROSTAT ) 0.4 MG SL tablet Place 1 tablet (0.4 mg total) under the tongue every 5 (five) minutes as needed for chest pain. 25 tablet 1  . rosuvastatin  (CRESTOR ) 10 MG tablet Take 1 tablet (10 mg total) by mouth daily. 90 tablet 3  . spironolactone  (ALDACTONE ) 25 MG tablet Take 1 tablet (25 mg total) by mouth daily. 90 tablet 2  . valsartan  (DIOVAN ) 320 MG tablet Take 1 tablet (320 mg total) by mouth daily. 90 tablet 3  . VENTOLIN  HFA 108 (90 Base) MCG/ACT inhaler Inhale 2 puffs into the  lungs every 4 (four) hours as needed for wheezing or shortness of breath. 18 g 1  . benzonatate  (TESSALON ) 200 MG capsule Take 1 capsule (200 mg total) by mouth 2 (two) times daily as needed for cough. (Patient not taking: Reported on 09/24/2023) 20 capsule 0  . clotrimazole -betamethasone  (LOTRISONE ) cream Apply 1 Application topically daily. (Patient not taking: Reported on 09/24/2023) 45 each 0  . esomeprazole  (NEXIUM ) 40 MG  capsule Take 1 capsule (40 mg total) by mouth daily. Please call [phone] to schedule an appointment for further refills. (Patient not taking: Reported on 09/24/2023) 90 capsule 0  . guaiFENesin  (MUCINEX ) 600 MG 12 hr tablet Take 2 tablets (1,200 mg total) by mouth 2 (two) times daily. (Patient not taking: Reported on 09/24/2023) 30 tablet 0  . oxybutynin  (DITROPAN  XL) 10 MG 24 hr tablet Take 1 tablet (10 mg total) by mouth at bedtime. (Patient not taking: Reported on 09/24/2023) 30 tablet 2   No facility-administered medications prior to visit.    Allergies  Allergen Reactions  . Codeine Nausea And Vomiting  . Hydralazine  Hcl Other (See Comments)    Pt reports causes dizziness  . Lisinopril-Hydrochlorothiazide Hives    REACTION: lip swelling,facial swelling,rash.  . Metoprolol  Other (See Comments)    Does not feel well on it   . Other Cough    Patient states she is allergic to Shrimp, egg whites, tomatoes and dairy products.  Says they make her feel unwell but do not cause hives, SOB or anaphylaxis.  She just avoids eating them because they can make her have a headache.  Strawberries, watermelon, peanuts, and all shellfish.    environmental  . Shrimp [Shellfish Allergy ] Other (See Comments)    Was allergy  tested and showed shrimp was one--has not reacted b/c avoided    Review of Systems  Constitutional:  Positive for malaise/fatigue. Negative for fever.  HENT:  Positive for congestion. Negative for nosebleeds.   Eyes:  Negative for blurred vision.  Respiratory:  Negative for shortness of breath.   Cardiovascular:  Negative for chest pain, palpitations and leg swelling.  Gastrointestinal:  Negative for abdominal pain, blood in stool and nausea.  Genitourinary:  Negative for dysuria and frequency.  Musculoskeletal:  Negative for falls.  Skin:  Negative for rash.  Neurological:  Positive for dizziness, tingling and headaches. Negative for sensory change, seizures and loss of  consciousness.  Endo/Heme/Allergies:  Negative for environmental allergies.  Psychiatric/Behavioral:  Negative for depression. The patient is not nervous/anxious.        Objective:    Physical Exam Constitutional:      General: She is not in acute distress.    Appearance: Normal appearance. She is well-developed. She is not toxic-appearing.  HENT:     Head: Normocephalic and atraumatic.     Right Ear: External ear normal.     Left Ear: External ear normal.     Nose: Nose normal.  Eyes:     General:        Right eye: No discharge.        Left eye: No discharge.     Conjunctiva/sclera: Conjunctivae normal.  Neck:     Thyroid : No thyromegaly.  Cardiovascular:     Rate and Rhythm: Normal rate and regular rhythm.     Heart sounds: Normal heart sounds. No murmur heard. Pulmonary:     Effort: Pulmonary effort is normal. No respiratory distress.     Breath sounds: Normal breath sounds.  Abdominal:  General: Bowel sounds are normal.     Palpations: Abdomen is soft.     Tenderness: There is no abdominal tenderness. There is no guarding.  Musculoskeletal:        General: Normal range of motion.     Cervical back: Neck supple.  Lymphadenopathy:     Cervical: No cervical adenopathy.  Skin:    General: Skin is warm and dry.  Neurological:     Mental Status: She is alert and oriented to person, place, and time. Mental status is at baseline.     Cranial Nerves: No cranial nerve deficit.     Coordination: Coordination normal.     Deep Tendon Reflexes: Reflexes normal.  Psychiatric:        Mood and Affect: Mood normal.        Behavior: Behavior normal.        Thought Content: Thought content normal.        Judgment: Judgment normal.    BP (!) 156/76 (BP Location: Right Arm, Patient Position: Sitting, Cuff Size: Large)   Pulse (!) 57   Temp 98 F (36.7 C) (Oral)   Resp 16   Ht 4' 11 (1.499 m)   Wt 224 lb 12.8 oz (102 kg)   SpO2 98%   BMI 45.40 kg/m  Wt Readings from  Last 3 Encounters:  09/27/23 225 lb 3.2 oz (102.2 kg)  09/24/23 224 lb 12.8 oz (102 kg)  07/25/23 229 lb 6.4 oz (104.1 kg)    Diabetic Foot Exam - Simple   No data filed    Lab Results  Component Value Date   WBC 10.1 07/25/2023   HGB 13.2 07/25/2023   HCT 40.9 07/25/2023   PLT 241.0 07/25/2023   GLUCOSE 89 09/24/2023   CHOL 126 07/25/2023   TRIG 48.0 07/25/2023   HDL 40.50 07/25/2023   LDLCALC 75 07/25/2023   ALT 18 09/24/2023   AST 18 09/24/2023   NA 140 09/24/2023   K 4.6 09/24/2023   CL 103 09/24/2023   CREATININE 1.06 09/24/2023   BUN 24 (H) 09/24/2023   CO2 29 09/24/2023   TSH 1.19 07/25/2023   INR 1.06 01/10/2015   HGBA1C 5.4 07/25/2023    Lab Results  Component Value Date   TSH 1.19 07/25/2023   Lab Results  Component Value Date   WBC 10.1 07/25/2023   HGB 13.2 07/25/2023   HCT 40.9 07/25/2023   MCV 91.0 07/25/2023   PLT 241.0 07/25/2023   Lab Results  Component Value Date   NA 140 09/24/2023   K 4.6 09/24/2023   CO2 29 09/24/2023   GLUCOSE 89 09/24/2023   BUN 24 (H) 09/24/2023   CREATININE 1.06 09/24/2023   BILITOT 0.5 09/24/2023   ALKPHOS 73 09/24/2023   AST 18 09/24/2023   ALT 18 09/24/2023   PROT 7.4 09/24/2023   ALBUMIN 4.3 09/24/2023   CALCIUM  9.3 09/24/2023   ANIONGAP 9 09/15/2017   EGFR 51 (L) 06/12/2023   GFR 51.20 (L) 09/24/2023   Lab Results  Component Value Date   CHOL 126 07/25/2023   Lab Results  Component Value Date   HDL 40.50 07/25/2023   Lab Results  Component Value Date   LDLCALC 75 07/25/2023   Lab Results  Component Value Date   TRIG 48.0 07/25/2023   Lab Results  Component Value Date   CHOLHDL 3 07/25/2023   Lab Results  Component Value Date   HGBA1C 5.4 07/25/2023  Assessment & Plan:  Chronic heart failure with preserved ejection fraction [facility]) Assessment & Plan: Asymptomatic, continue to monitor   Essential hypertension Assessment & Plan: Numbers have been elevated recently.  Encouraged heart healthy diet such as the DASH diet and exercise as tolerated.    Orders: -     Comprehensive metabolic panel  Hyperglycemia Assessment & Plan: hgba1c acceptable, minimize simple carbs. Increase exercise as tolerated.    Pure hypercholesterolemia Assessment & Plan: Tolerating statin, encouraged heart healthy diet, avoid trans fats, minimize simple carbs and saturated fats. Increase exercise as tolerated. Is tolerating Rosuvastatin    Vitamin D  deficiency Assessment & Plan: Supplement and monitor   Osteoporosis, unspecified osteoporosis type, unspecified pathological fracture presence Assessment & Plan: Encouraged to get adequate exercise, calcium  and vitamin d  intake    OSA on CPAP Assessment & Plan: Uses CPAP   Other orders -     amLODIPine  Besylate; Take 1 tablet (2.5 mg total) by mouth daily.  Dispense: 30 tablet; Refill: 3    Assessment and Plan    Hypertension Recent episodes of elevated blood pressure at home, up to 196/84. Patient has been self-adjusting Valsartan  dose due to concern about high readings. -Start Amlodipine  2.5mg  daily in addition to current Valsartan . -Advise against taking extra Valsartan  due to potential kidney damage. -Check renal function today to ensure no harm from extra Valsartan . -Schedule nurse visit in 4 weeks to check blood pressure and pulse.  Angina Recent episode of chest pain requiring two doses of sublingual nitroglycerin . -Continue current management with nitroglycerin  as needed for chest pain. she follows with cardiology and knows to seek care if pain unresolved or if pattern escalates -Educate on proper use of nitroglycerin  and expected side effects.  Headache Recent onset of left-sided headaches, described as shooting pain and cluster-like pain. -Advise hydration, regular protein intake, and rest. -Consider further evaluation if headaches worsen or persist.  Cataracts Patient reports worsening cataracts,  plans to have surgery next month. -Encourage follow-up with ophthalmologist for cataract surgery.  Hearing loss Patient reports hearing loss, considering hearing aids despite allergist's advice. -Support patient's decision to pursue hearing aids for improved quality of life.  Follow-up in May for routine visit.         Harlene Horton, MD

## 2023-10-02 ENCOUNTER — Ambulatory Visit (INDEPENDENT_AMBULATORY_CARE_PROVIDER_SITE_OTHER): Payer: Medicare HMO | Admitting: *Deleted

## 2023-10-02 DIAGNOSIS — J309 Allergic rhinitis, unspecified: Secondary | ICD-10-CM | POA: Diagnosis not present

## 2023-10-22 ENCOUNTER — Ambulatory Visit (INDEPENDENT_AMBULATORY_CARE_PROVIDER_SITE_OTHER): Payer: Self-pay | Admitting: *Deleted

## 2023-10-22 DIAGNOSIS — J309 Allergic rhinitis, unspecified: Secondary | ICD-10-CM | POA: Diagnosis not present

## 2023-10-23 ENCOUNTER — Ambulatory Visit: Payer: Medicare HMO | Admitting: Family Medicine

## 2023-10-23 DIAGNOSIS — I10 Essential (primary) hypertension: Secondary | ICD-10-CM

## 2023-10-23 NOTE — Progress Notes (Signed)
Patient comes in today for blood pressure check per Dr. Abner Greenspan. From office visit note 09/24/2023: Hypertension Recent episodes of elevated blood pressure at home, up to 196/84. Patient has been self-adjusting Valsartan dose due to concern about high readings. -Start Amlodipine 2.5mg  daily in addition to current Valsartan. -Advise against taking extra Valsartan due to potential kidney damage. -Check renal function today to ensure no harm from extra Valsartan. -Schedule nurse visit in 4 weeks to check blood pressure and pulse.  Anne Barnes is taking Valsartan 320 mg and Amlodipine 2.5 mg daily for blood pressure control. She denies any missed doses, side effects, headaches, chest pain, palpitations, dizziness, or shortness of breath.   Anne Barnes has been checking blood pressure readings at home.  Her first blood pressure reading today is: L arm 132/72 and HR 74  I spoke with DOD Dr. Carmelia Roller who advised pt to continue with same medication regimen and follow up as needed.

## 2023-10-25 ENCOUNTER — Other Ambulatory Visit: Payer: Self-pay

## 2023-10-25 MED ORDER — SPIRONOLACTONE 25 MG PO TABS: 25.0000 mg | ORAL_TABLET | Freq: Every day | ORAL | 2 refills | Status: AC

## 2023-10-25 MED ORDER — ROSUVASTATIN CALCIUM 10 MG PO TABS: 10.0000 mg | ORAL_TABLET | Freq: Every day | ORAL | 2 refills | Status: DC

## 2023-10-28 DIAGNOSIS — J3081 Allergic rhinitis due to animal (cat) (dog) hair and dander: Secondary | ICD-10-CM | POA: Diagnosis not present

## 2023-10-28 NOTE — Progress Notes (Signed)
VIAL EXP 10-27-24

## 2023-10-29 NOTE — Progress Notes (Signed)
 Cardiology Office Note:    Date:  10/30/2023  ID:  Anne Barnes, DOB 1945/12/02, MRN [id number] PCP: Domenica Harlene DELENA, MD  [facility] Providers Cardiologist:  Stanly DELENA Leavens, MD Cardiology APP:  Lelon Glendia DASEN, PA-C       Patient Profile:      Coronary artery disease Myoview  03/26/2017: No ischemia Non-obs dz - CCTA 10/31/2019: CAC score 127; RCA proximal and mid 25-49, LAD proximal and mid 0-25 (HFpEF) heart failure with preserved ejection fraction TTE 03/31/2021: EF 60-65, no RWMA, normal RVSF, trivial MR  TTE 11/05/2022: EF 55-60, no RWMA, mild concentric LVH, GR 2 DD, normal RVSF, moderate LAE, trivial MR, mild AV calcification, RAP 3 Bradycardia 2/2 beta-blocker  Cardiac monitor 12/2019: Sinus bradycardia, average heart rate 57, short episodes of SVT Hypertension Hyperlipidemia OSA intol to CPAP Asthma IBS   GERD Obesity          Anne Barnes is a 78 y.o. female who returns for follow up of CHF, CAD. She was last seen in 04/2023.   Discussed the use of AI scribe software for clinical note transcription with the patient, who gave verbal consent to proceed.  History of Present Illness   She experiences occasional shortness of breath, particularly when climbing stairs, which she attributes to allergies. No chest pain, dizziness, or syncope. She sleeps in a recliner for comfort but occasionally uses a bed. She has been actively trying to lose weight since October, having lost eight pounds through increased walking and using exercise equipment at home. She has also reduced her intake of junk food. No smoking or alcohol use. She dropped out of high school but later obtained her diploma and attended some college. She was married for 55 years.      ROS-See HPI    Studies Reviewed:   EKG Interpretation Date/Time:  Wednesday October 30 2023 10:39:16 EST Ventricular Rate:  73 PR Interval:  162 QRS Duration:  96 QT Interval:  408 QTC Calculation: 449 R  Axis:   -13  Text Interpretation: Normal sinus rhythm Nonspecific T wave abnormality Confirmed by Lelon Glendia [phone]) on 10/30/2023 10:44:33 AM    Results   LABS - Chart Review K: 4.6 (09/24/2023) Cr: 1.06 (09/24/2023) ALT: 18 (09/24/2023) GFR: 51.2 (09/24/2023) LDL: 75 (07/25/2023) Hb: 13.2 (07/25/2023)        Risk Assessment/Calculations:     HYPERTENSION CONTROL Vitals:   10/30/23 1035 10/30/23 1108  BP: (!) 140/86 (!) 152/84    The patient's blood pressure is elevated above target today.  In order to address the patient's elevated BP: Blood pressure will be monitored at home to determine if medication changes need to be made.          Physical Exam:   VS:  BP (!) 152/84   Pulse 76   Resp 16   Ht 4' 11 (1.499 m)   Wt 221 lb (100.2 kg)   SpO2 99%   BMI 44.64 kg/m    Wt Readings from Last 3 Encounters:  10/30/23 221 lb (100.2 kg)  09/27/23 225 lb 3.2 oz (102.2 kg)  09/24/23 224 lb 12.8 oz (102 kg)    Constitutional:      Appearance: Healthy appearance. Not in distress.  Neck:     Vascular: No JVR.  Pulmonary:     Breath sounds: Normal breath sounds. No wheezing. No rales.  Cardiovascular:     Normal rate. Regular rhythm.     Murmurs: There is no  murmur.  Edema:    Peripheral edema present.    Ankle: bilateral trace edema of the ankle. Abdominal:     Palpations: Abdomen is soft.      Assessment and Plan:   Assessment & Plan Coronary artery disease involving native coronary artery of native heart without angina pectoris Nonobstructive coronary artery disease by CT in 2021. She has a hx of chronic chest pain. However, she has not noticed any recurrent chest pain since starting on Jardiance .  -ASA 81 mg once daily, Crestor  10 mg once daily  Chronic heart failure with preserved ejection fraction (HCC) NYHA II-IIb. Volume status stable.  -Continue Jardiance  10 mg once daily, Spironolactone  25 mg once daily, lasix  20 mg 3 times a week -If BP remains  difficult to control, consider changing Valsartan  to Entresto -Follow up 6 mos Essential hypertension BP elevated today. She notes better BP readings at home. She missed a dose of Valsartan  recently. -Continue Amlodipine  2.5 mg once daily, Spironolactone  25 mg once daily, Valsartan  320 mg once daily  -Monitor BP and send readings in 1 week        Dispo:  Return in about 6 months (around 04/28/2024) for Routine Follow Up, w/ Dr. Santo, or Glendia Ferrier, PA-C.  Signed, Glendia Ferrier, PA-C

## 2023-10-30 ENCOUNTER — Ambulatory Visit: Payer: Medicare HMO | Attending: Physician Assistant | Admitting: Physician Assistant

## 2023-10-30 ENCOUNTER — Encounter: Payer: Self-pay | Admitting: Physician Assistant

## 2023-10-30 VITALS — BP 152/84 | HR 76 | Resp 16 | Ht 59.0 in | Wt 221.0 lb

## 2023-10-30 DIAGNOSIS — I10 Essential (primary) hypertension: Secondary | ICD-10-CM

## 2023-10-30 DIAGNOSIS — I5032 Chronic diastolic (congestive) heart failure: Secondary | ICD-10-CM | POA: Diagnosis not present

## 2023-10-30 DIAGNOSIS — I2510 Atherosclerotic heart disease of native coronary artery without angina pectoris: Secondary | ICD-10-CM

## 2023-10-30 NOTE — Assessment & Plan Note (Signed)
 Nonobstructive coronary artery disease by CT in 2021. She has a hx of chronic chest pain. However, she has not noticed any recurrent chest pain since starting on Jardiance .  -ASA 81 mg once daily, Crestor  10 mg once daily

## 2023-10-30 NOTE — Patient Instructions (Signed)
 Medication Instructions:  Your physician recommends that you continue on your current medications as directed. Please refer to the Current Medication list given to you today.  *If you need a refill on your cardiac medications before your next appointment, please call your pharmacy*   Lab Work: None ordered  If you have labs (blood work) drawn today and your tests are completely normal, you will receive your results only by: MyChart Message (if you have MyChart) OR A paper copy in the mail If you have any lab test that is abnormal or we need to change your treatment, we will call you to review the results.   Testing/Procedures: None ordered   Follow-Up: At [facility], you and your health needs are our priority.  As part of our continuing mission to provide you with exceptional heart care, we have created designated Provider Care Teams.  These Care Teams include your primary Cardiologist (physician) and Advanced Practice Providers (APPs -  Physician Assistants and Nurse Practitioners) who all work together to provide you with the care you need, when you need it.  We recommend signing up for the patient portal called MyChart.  Sign up information is provided on this After Visit Summary.  MyChart is used to connect with patients for Virtual Visits (Telemedicine).  Patients are able to view lab/test results, encounter notes, upcoming appointments, etc.  Non-urgent messages can be sent to your provider as well.   To learn more about what you can do with MyChart, go to [email or web address].    Your next appointment:   6 month(s)  Provider:   Stanly DELENA Leavens, MD  or Glendia Ferrier, PA-C         Other Instructions   1st Floor: - Lobby - Registration  - Pharmacy  - Lab - Cafe  2nd Floor: - PV Lab - Diagnostic Testing (echo, CT, nuclear med)  3rd Floor: - Vacant  4th Floor: - TCTS (cardiothoracic surgery) - AFib Clinic - Structural Heart Clinic - Vascular  Surgery  - Vascular Ultrasound  5th Floor: - [facility] Card[facility] (general and EP) - Clinical Pharmacy for coumadin, hypertension, lipid, weight-loss medications, and med management appointments    Valet parking services will be available as well.

## 2023-10-30 NOTE — Assessment & Plan Note (Signed)
 NYHA II-IIb. Volume status stable.  -Continue Jardiance  10 mg once daily, Spironolactone  25 mg once daily, lasix  20 mg 3 times a week -If BP remains difficult to control, consider changing Valsartan  to Entresto -Follow up 6 mos

## 2023-10-30 NOTE — Assessment & Plan Note (Signed)
BP elevated today. She notes better BP readings at home. She missed a dose of Valsartan recently. -Continue Amlodipine 2.5 mg once daily, Spironolactone 25 mg once daily, Valsartan 320 mg once daily  -Monitor BP and send readings in 1 week

## 2023-11-25 ENCOUNTER — Encounter: Payer: Self-pay | Admitting: Internal Medicine

## 2023-11-28 NOTE — Telephone Encounter (Signed)
 Order- DME [facility]- please change autopap 40 4-20 then provide download in 2 weeks. Thanks.

## 2023-11-29 ENCOUNTER — Ambulatory Visit: Payer: Medicare HMO | Admitting: Allergy

## 2023-11-29 ENCOUNTER — Other Ambulatory Visit: Payer: Self-pay

## 2023-11-29 ENCOUNTER — Ambulatory Visit: Payer: Self-pay | Admitting: *Deleted

## 2023-11-29 ENCOUNTER — Encounter: Payer: Self-pay | Admitting: Allergy

## 2023-11-29 VITALS — BP 138/70 | HR 83 | Temp 98.2°F | Resp 16

## 2023-11-29 DIAGNOSIS — J329 Chronic sinusitis, unspecified: Secondary | ICD-10-CM | POA: Diagnosis not present

## 2023-11-29 DIAGNOSIS — Z91018 Allergy to other foods: Secondary | ICD-10-CM | POA: Diagnosis not present

## 2023-11-29 DIAGNOSIS — J4520 Mild intermittent asthma, uncomplicated: Secondary | ICD-10-CM | POA: Diagnosis not present

## 2023-11-29 DIAGNOSIS — J3089 Other allergic rhinitis: Secondary | ICD-10-CM | POA: Diagnosis not present

## 2023-11-29 MED ORDER — AMOXICILLIN-POT CLAVULANATE 875-125 MG PO TABS: 1.0000 | ORAL_TABLET | Freq: Two times a day (BID) | ORAL | 0 refills | Status: AC

## 2023-11-29 NOTE — Progress Notes (Signed)
 Follow-up Note  RE: Anne Barnes MRN: [id number] DOB: Apr 26, 1946 Date of Office Visit: 11/29/2023   History of present illness: Anne Barnes is a 78 y.o. female presenting today for worsening sinus symptoms.  She has history of allergic rhinitis, asthma and food allergy.  She was last seen in the office on 05/31/2023 by myself.  She has been experiencing chronic ear congestion and sinus issues for about five years, with symptoms worsening over the past six months. She describes a sensation of clogged ears and fluid buildup, often feeling like she is 'hearing through something.' Mucus builds up and drains down her throat and sometimes feel like it "plops" down in her throat.  She uses Astelin and Flonase nasal sprays to manage her symptoms, but they do not seem to be very effective. She previously tried saline rinses but discontinued them after a month due to a severe burning sensation.   She experiences dizziness and a pounding sensation in her head when coughing, which can lead to falls if she does not sit down immediately. She recalls a recent incident where she nearly fell while making tea in the kitchen.  She uses a CPAP machine for sleep apnea but questions its effectiveness, noting that her symptoms seem to worsen upon waking. She maintains the CPAP by adding water and cleaning it every other day.  She has been on allergy shots since 2021 and is interested in retesting her allergies, noting improvement in foods that used to cause oral symptoms when eating states she can now eat these foods but remains cautious about shrimp and continues to avoid shellfish.  She has access to an epinephrine device.    Review of systems: 10pt ROS negative unless noted above in HPI  All other systems negative unless noted above in HPI  Past medical/social/surgical/family history have been reviewed and are unchanged unless specifically indicated below.  No changes  Medication List: Current  Outpatient Medications  Medication Sig Dispense Refill   albuterol (VENTOLIN HFA) 108 (90 Base) MCG/ACT inhaler INHALE 2 PUFFS BY MOUTH EVERY 6 HOURS AS NEEDED FOR WHEEZING FOR SHORTNESS OF BREATH 18 g 12   alendronate (FOSAMAX) 70 MG tablet Take 1 tablet (70 mg total) by mouth once a week. Take with a full glass of water on an empty stomach. 4 tablet 5   ALPRAZolam (XANAX) 0.5 MG tablet TAKE 1 TABLET BY MOUTH TWICE DAILY AS NEEDED FOR ANXIETY AND FOR SLEEP 20 tablet 0   amLODipine (NORVASC) 2.5 MG tablet Take 1 tablet (2.5 mg total) by mouth daily. 30 tablet 3   amoxicillin-clavulanate (AUGMENTIN) 875-125 MG tablet Take 1 tablet by mouth 2 (two) times daily for 14 days. 28 tablet 0   aspirin EC 81 MG tablet Take 81 mg by mouth daily. Swallow whole.     azelastine (ASTELIN) 0.1 % nasal spray Place 2 sprays into both nostrils 2 (two) times daily as needed for rhinitis. 30 mL 5   azelastine (OPTIVAR) 0.05 % ophthalmic solution Place 1 drop into both eyes 2 (two) times daily as needed. 6 mL 5   b complex vitamins tablet Take 1 tablet by mouth daily.     Cholecalciferol (VITAMIN D) 50 MCG (2000 UT) tablet Take 2,000 Units by mouth daily.     empagliflozin (JARDIANCE) 10 MG TABS tablet Take 1 tablet (10 mg total) by mouth daily before breakfast. 90 tablet 3   fluticasone (FLONASE) 50 MCG/ACT nasal spray Use 1-2 sprays in each nostril once  a day as needed for stuffy nose. In the right nostril, point the applicator out toward the right ear. In the left nostril, point the applicator out toward the left ear 16 g 5   furosemide (LASIX) 20 MG tablet Take 1 tablet (20 mg total) by mouth 3 (three) times a week. Monday, Wednesday and Friday. 90 tablet 3   levocetirizine (XYZAL) 5 MG tablet TAKE 1 TABLET BY MOUTH ONCE DAILY IN THE EVENING 30 tablet 5   montelukast (SINGULAIR) 10 MG tablet Take 1 tablet (10 mg total) by mouth at bedtime. 30 tablet 5   Multiple Vitamin (MULTIVITAMIN) tablet Take 1 tablet by mouth  daily.     nitroGLYCERIN (NITROSTAT) 0.4 MG SL tablet Place 1 tablet (0.4 mg total) under the tongue every 5 (five) minutes as needed for chest pain. 25 tablet 1   rosuvastatin (CRESTOR) 10 MG tablet Take 1 tablet (10 mg total) by mouth daily. 90 tablet 2   spironolactone (ALDACTONE) 25 MG tablet Take 1 tablet (25 mg total) by mouth daily. 90 tablet 2   valsartan (DIOVAN) 320 MG tablet Take 1 tablet (320 mg total) by mouth daily. 90 tablet 3   VENTOLIN HFA 108 (90 Base) MCG/ACT inhaler Inhale 2 puffs into the lungs every 4 (four) hours as needed for wheezing or shortness of breath. 18 g 1   No current facility-administered medications for this visit.     Known medication allergies: Allergies  Allergen Reactions   Codeine Nausea And Vomiting   Hydralazine Hcl Other (See Comments)    Pt reports causes dizziness   Lisinopril-Hydrochlorothiazide Hives    REACTION: lip swelling,facial swelling,rash.   Metoprolol Other (See Comments)    Does not feel well on it    Other Cough    Patient states she is allergic to Shrimp, egg whites, tomatoes and dairy products.  Says they make her feel unwell but do not cause hives, SOB or anaphylaxis.  She just avoids eating them because they can make her have a headache.  Strawberries, watermelon, peanuts, and all shellfish.    environmental   Shrimp [Shellfish Allergy] Other (See Comments)    Was allergy tested and showed shrimp was one--has not reacted b/c avoided     Physical examination: Blood pressure 138/70, pulse 83, temperature 98.2 F (36.8 C), temperature source Temporal, resp. rate 16, SpO2 97%.  General: Alert, interactive, in no acute distress. HEENT: PERRLA, left TMs is dull with visible fluid, right TM also dull without visible fluid, turbinates minimally edematous without discharge, post-pharynx non erythematous. Neck: Supple without lymphadenopathy. Lungs: Clear to auscultation without wheezing, rhonchi or rales. {no increased work of  breathing. CV: Normal S1, S2 without murmurs. Abdomen: Nondistended, nontender. Skin: Warm and dry, without lesions or rashes. Extremities:  No clubbing, cyanosis or edema. Neuro:   Grossly intact.  Diagnositics/Labs: Allergen immunotherapy injections received today  Assessment and plan: Chronic rhinosinusitis - with worsening symptoms Perennial allergic rhinitis - continue avoidance measures for dust mites, cat and dog.   - Hold Flonase and Astelin for now as not very effective - Use Xhance nasal spray 2 sprays each nostril 1-2 times a day for congestion/drainage.  Provided with samples today.  This spray is a stronger version of Flonase and should help decrease sinus congestion/ear symptoms - Take Augmentin 875mg  1 tab twice a day for next 14 days.  - continue Xyzal 5mg  daily - continue Singulair 10mg  daily - use Optivar 1 drop each eye up to twice a  day as needed for itchy/watery/red eyes.  - continue allergy injections per schedule and have access to epipen.  - schedule for skin testing visit and hold Xyzal for 3 days prior to this visit.   Food allergy - continue food avoidance of shellfish, gluten, soybean, peanut  - have access to your epinephrine device 0.3mg  in case of allergic reaction.   - follow emergency action plan in case of allergic reaction   Mild intermittent asthma - have access to albuterol inhaler 2 puffs every 4-6 hours as needed for cough/wheeze/shortness of breath/chest tightness.  May use 15-20 minutes prior to activity.   Monitor frequency of use.    Follow-up for skin testing visit.  Hold antihistamine (Xyzal) for 3 days prior to this visit  I appreciate the opportunity to take part in [place] care. Please do not hesitate to contact me with questions.  Sincerely,   Margo Aye, MD Allergy/Immunology [facility]

## 2023-11-29 NOTE — Patient Instructions (Addendum)
 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   - Hold Flonase and Astelin for now as not very effective - Use Xhance nasal spray 2 sprays each nostril 1-2 times a day for congestion/drainage.  Provided with samples today.  This spray is a stronger version of Flonase and should help decrease sinus congestion/ear symptoms - Take Augmentin 875mg  1 tab twice a day for next 14 days.  - continue Xyzal 5mg  daily - continue Singulair 10mg  daily - use Optivar 1 drop each eye up to twice a day as needed for itchy/watery/red eyes.  - continue allergy injections per schedule and have access to epipen.  - schedule for skin testing visit and hold Xyzal for 3 days prior to this visit.   Food allergy - continue food avoidance of shellfish, gluten, soybean, peanut  - have access to your epinephrine device 0.3mg  in case of allergic reaction.   - follow emergency action plan in case of allergic reaction   Mild intermittent asthma - have access to albuterol inhaler 2 puffs every 4-6 hours as needed for cough/wheeze/shortness of breath/chest tightness.  May use 15-20 minutes prior to activity.   Monitor frequency of use.    Follow-up for skin testing visit.  Hold antihistamine (Xyzal) for 3 days prior to this visit

## 2023-12-04 ENCOUNTER — Telehealth: Payer: Self-pay

## 2023-12-04 DIAGNOSIS — G4733 Obstructive sleep apnea (adult) (pediatric): Secondary | ICD-10-CM

## 2023-12-04 NOTE — Telephone Encounter (Signed)
 Pressure settings for cpap ordered.

## 2023-12-31 ENCOUNTER — Ambulatory Visit: Admitting: Podiatry

## 2023-12-31 DIAGNOSIS — M79675 Pain in left toe(s): Secondary | ICD-10-CM

## 2023-12-31 DIAGNOSIS — L819 Disorder of pigmentation, unspecified: Secondary | ICD-10-CM | POA: Diagnosis not present

## 2023-12-31 DIAGNOSIS — G629 Polyneuropathy, unspecified: Secondary | ICD-10-CM

## 2023-12-31 DIAGNOSIS — B351 Tinea unguium: Secondary | ICD-10-CM

## 2023-12-31 DIAGNOSIS — M79674 Pain in right toe(s): Secondary | ICD-10-CM

## 2023-12-31 NOTE — Patient Instructions (Signed)
 You can use capsaicin cream or Aspercreme with lidocaine to help with the neuropathy  -  Neuropathic Pain Neuropathic pain is pain caused by damage to the nerves that are responsible for certain sensations in your body (sensory nerves). Neuropathic pain can make you more sensitive to pain. Even a minor sensation can feel very painful. This is usually a long-term (chronic) condition that can be difficult to treat. The type of pain differs from person to person. It may: Start suddenly (acute), or it may develop slowly and become chronic. Come and go as damaged nerves heal, or it may stay at the same level for years. Cause emotional distress, loss of sleep, and a lower quality of life. What are the causes? The most common cause of this condition is diabetes. Many other diseases and conditions can also cause neuropathic pain. Causes of neuropathic pain can be classified as: Toxic. This is caused by medicines and chemicals. The most common causes of toxic neuropathic pain is damage from medicines that kill cancer cells (chemotherapy) or alcohol abuse. Metabolic. This can be caused by: Diabetes. Lack of vitamins like B12. Traumatic. Any injury that cuts, crushes, or stretches a nerve can cause damage and pain. Compression-related. If a sensory nerve gets trapped or compressed for a long period of time, the blood supply to the nerve can be cut off. Vascular. Many blood vessel diseases can cause neuropathic pain by decreasing blood supply and oxygen to nerves. Autoimmune. This type of pain results from diseases in which the body's defense system (immune system) mistakenly attacks sensory nerves. Examples of autoimmune diseases that can cause neuropathic pain include lupus and multiple sclerosis. Infectious. Many types of viral infections can damage sensory nerves and cause pain. Shingles infection is a common cause of this type of pain. Inherited. Neuropathic pain can be a symptom of many diseases that  are passed down through families (genetic). What increases the risk? You are more likely to develop this condition if: You have diabetes. You smoke. You drink too much alcohol. You are taking certain medicines, including chemotherapy or medicines that treat immune system disorders. What are the signs or symptoms? The main symptom is pain. Neuropathic pain is often described as: Burning. Shock-like. Stinging. Hot or cold. Itching. How is this diagnosed? No single test can diagnose neuropathic pain. It is diagnosed based on: A physical exam and your symptoms. Your health care provider will ask you about your pain. You may be asked to use a pain scale to describe how bad your pain is. Tests. These may be done to see if you have a cause and location of any nerve damage. They include: Nerve conduction studies and electromyography to test how well nerve signals travel through your nerves and muscles (electrodiagnostic testing). Skin biopsy to evaluate for small fiber neuropathy. Imaging studies, such as: X-rays. CT scan. MRI. How is this treated? Treatment for neuropathic pain may change over time. You may need to try different treatment options or a combination of treatments. Some options include: Treating the underlying cause of the neuropathy, such as diabetes, kidney disease, or vitamin deficiencies. Stopping medicines that can cause neuropathy, such as chemotherapy. Medicine to relieve pain. Medicines may include: Prescription or over-the-counter pain medicine. Anti-seizure medicine. Antidepressant medicines. Pain-relieving patches or creams that are applied to painful areas of skin. A medicine to numb the area (local anesthetic), which can be injected as a nerve block. Transcutaneous nerve stimulation. This uses electrical currents to block painful nerve signals. The treatment is  painless. Alternative treatments, such as: Acupuncture. Meditation. Massage. Occupational or  physical therapy. Pain management programs. Counseling. Follow these instructions at home: Medicines  Take over-the-counter and prescription medicines only as told by your health care provider. Ask your health care provider if the medicine prescribed to you: Requires you to avoid driving or using machinery. Can cause constipation. You may need to take these actions to prevent or treat constipation: Drink enough fluid to keep your urine pale yellow. Take over-the-counter or prescription medicines. Eat foods that are high in fiber, such as beans, whole grains, and fresh fruits and vegetables. Limit foods that are high in fat and processed sugars, such as fried or sweet foods. Lifestyle  Have a good support system at home. Consider joining a chronic pain support group. Do not use any products that contain nicotine or tobacco. These products include cigarettes, chewing tobacco, and vaping devices, such as e-cigarettes. If you need help quitting, ask your health care provider. Do not drink alcohol. General instructions Learn as much as you can about your condition. Work closely with all your health care providers to find the treatment plan that works best for you. Ask your health care provider what activities are safe for you. Keep all follow-up visits. This is important. Contact a health care provider if: Your pain treatments are not working. You are having side effects from your medicines. You are struggling with tiredness (fatigue), mood changes, depression, or anxiety. Get help right away if: You have thoughts of hurting yourself. Get help right away if you feel like you may hurt yourself or others, or have thoughts about taking your own life. Go to your nearest emergency room or: Call 911. Call the National Suicide Prevention Lifeline at 272 328 1774 or 988. This is open 24 hours a day. Text the Crisis Text Line at (312) 739-1986. Summary Neuropathic pain is pain caused by damage to the  nerves that are responsible for certain sensations in your body (sensory nerves). Neuropathic pain may come and go as damaged nerves heal, or it may stay at the same level for years. Neuropathic pain is usually a long-term condition that can be difficult to treat. Consider joining a chronic pain support group. This information is not intended to replace advice given to you by your health care provider. Make sure you discuss any questions you have with your health care provider. Document Revised: 05/08/2021 Document Reviewed: 05/08/2021 Elsevier Patient Education  2024 [employer].

## 2024-01-01 NOTE — Progress Notes (Unsigned)
 Subjective: Chief Complaint  Patient presents with   RFC    RM#11 rfc patient has concerns about circulation of the toes.   78 year old female presents the office today with concerns of thick, elongated nails that she is not able to trim herself.  No swelling or redness or injury to the toenails.  She is current about circulation to her feet at times she notes her toes will turn white.  Currently not experiencing the symptoms.  No ulcerations.  Also has neuropathy still.  Objective: AAO x3, NAD DP/PT pulses palpable bilaterally, CRT less than 3 seconds-normal color to the digits today. Chronic appearing lower extremity edema present bilaterally.  There is no erythema or warmth. Nails are hypertrophic, dystrophic, brittle, discolored, elongated 10. No surrounding redness or drainage. Tenderness nails 1-5 bilaterally. No open lesions or pre-ulcerative lesions are identified today. Unable to appreciate any area pinpoint tenderness. No pain with calf compression, swelling, warmth, erythema  Assessment: 78 year old female with symptomatic onychomycosis, neuropathy  Plan:  Symptomatic onychomycosis -Sharply debrided the nails x 10 without any complications or bleeding  Neuropathy/circulatory concerns -Will check ABI. -Discussed topical creams that she can use to help with the discomfort from a neuropathy standpoint. -Daily foot inspection.  Return in about 3 months (around 03/31/2024).  Vivi Barrack DPM

## 2024-01-03 ENCOUNTER — Other Ambulatory Visit: Payer: Self-pay | Admitting: Allergy

## 2024-01-08 ENCOUNTER — Ambulatory Visit (INDEPENDENT_AMBULATORY_CARE_PROVIDER_SITE_OTHER): Payer: Self-pay

## 2024-01-08 DIAGNOSIS — J309 Allergic rhinitis, unspecified: Secondary | ICD-10-CM | POA: Diagnosis not present

## 2024-01-09 ENCOUNTER — Telehealth (HOSPITAL_COMMUNITY): Payer: Self-pay

## 2024-01-09 NOTE — Telephone Encounter (Signed)
 Attempted to contact the patient to schedule VAS US .  No answer.  Unable to leave message.  First Attempt. Was unable to provide direct contact number for scheduling: [phone].

## 2024-01-14 ENCOUNTER — Other Ambulatory Visit: Payer: Self-pay | Admitting: Family Medicine

## 2024-01-14 NOTE — Telephone Encounter (Signed)
 Requesting:alprazolam  0.5mg   Contract: None UDS: None Last Visit: 10/23/23 Next Visit: 01/23/24 Last Refill: 01/21/23 #20 and 0RF   Please Advise

## 2024-01-17 ENCOUNTER — Ambulatory Visit (INDEPENDENT_AMBULATORY_CARE_PROVIDER_SITE_OTHER): Payer: Self-pay

## 2024-01-17 DIAGNOSIS — J309 Allergic rhinitis, unspecified: Secondary | ICD-10-CM

## 2024-01-19 NOTE — Assessment & Plan Note (Signed)
 Numbers have been elevated recently. Encouraged heart healthy diet such as the DASH diet and exercise as tolerated.

## 2024-01-19 NOTE — Assessment & Plan Note (Signed)
 Asymptomatic, continue to monitor.

## 2024-01-19 NOTE — Assessment & Plan Note (Signed)
 Supplement and monitor

## 2024-01-19 NOTE — Assessment & Plan Note (Signed)
 [id number] acceptable, minimize simple carbs. Increase exercise as tolerated.

## 2024-01-19 NOTE — Assessment & Plan Note (Signed)
Tolerating statin, encouraged heart healthy diet, avoid trans fats, minimize simple carbs and saturated fats. Increase exercise as tolerated. Is tolerating Rosuvastatin

## 2024-01-19 NOTE — Assessment & Plan Note (Signed)
 Encouraged DASH or MIND diet, decrease po intake and increase exercise as tolerated. Needs 7-8 hours of sleep nightly. Avoid trans fats, eat small, frequent meals every 4-5 hours with lean proteins, complex carbs and healthy fats. Minimize simple carbs, high fat foods and processed foods

## 2024-01-19 NOTE — Assessment & Plan Note (Signed)
 Encouraged to get adequate exercise, calcium and vitamin d intake

## 2024-01-23 ENCOUNTER — Ambulatory Visit (INDEPENDENT_AMBULATORY_CARE_PROVIDER_SITE_OTHER): Payer: Medicare PPO | Admitting: Family Medicine

## 2024-01-23 ENCOUNTER — Encounter: Payer: Self-pay | Admitting: Family Medicine

## 2024-01-23 VITALS — BP 138/70 | HR 68 | Resp 16 | Ht 59.0 in | Wt 223.6 lb

## 2024-01-23 DIAGNOSIS — H9190 Unspecified hearing loss, unspecified ear: Secondary | ICD-10-CM | POA: Diagnosis not present

## 2024-01-23 DIAGNOSIS — I5032 Chronic diastolic (congestive) heart failure: Secondary | ICD-10-CM | POA: Diagnosis not present

## 2024-01-23 DIAGNOSIS — H9319 Tinnitus, unspecified ear: Secondary | ICD-10-CM

## 2024-01-23 DIAGNOSIS — I10 Essential (primary) hypertension: Secondary | ICD-10-CM

## 2024-01-23 DIAGNOSIS — R739 Hyperglycemia, unspecified: Secondary | ICD-10-CM

## 2024-01-23 DIAGNOSIS — E559 Vitamin D deficiency, unspecified: Secondary | ICD-10-CM

## 2024-01-23 DIAGNOSIS — M810 Age-related osteoporosis without current pathological fracture: Secondary | ICD-10-CM | POA: Diagnosis not present

## 2024-01-23 DIAGNOSIS — E7800 Pure hypercholesterolemia, unspecified: Secondary | ICD-10-CM

## 2024-01-23 MED ORDER — VALSARTAN 40 MG PO TABS: 40.0000 mg | ORAL_TABLET | Freq: Every day | ORAL | 5 refills | Status: DC

## 2024-01-23 NOTE — Progress Notes (Signed)
 Subjective:    Patient ID: Anne Barnes, female    DOB: 05-28-1946, 78 y.o.   MRN: [id number]  Chief Complaint  Patient presents with   Medical Management of Chronic Issues    Patient presents today for a 6 month follow-up   Quality Metric Gaps    AWV and mammogram    HPI Discussed the use of AI scribe software for clinical note transcription with the patient, who gave verbal consent to proceed.  History of Present Illness Anne Barnes is a 78 year old female who presents with ringing in the ears.  She experiences intermittent tinnitus in her right ear, described as transitioning from 'ringing to roaring,' occurring approximately once a week. She has not previously consulted an audiologist for this issue.  She has a history of cataracts in both eyes, with the left eye being affected for over twenty years. Despite being advised to undergo surgery last year, she has been resistant to the procedure. Her daughter encourages her to proceed with the surgery and has offered to pay for it.  She experiences episodes of dizziness and lightheadedness, often associated with sinus pressure and coughing. These episodes have led to falls, including a recent incident. Her son experiences similar symptoms.  Her current medications include amlodipine  for blood pressure, spironolactone  for heart health, and Jardiance , which she started in September or October, for kidney and heart protection.  She has a family history of dementia, with her father having Parkinson's-related dementia and her sister also affected by dementia. She is retired and has not driven since her husband's passing five years ago due to dizziness and lightheadedness.  No recent major fever, headache, vision changes, or chest pain. She acknowledges having cataracts but no sudden worsening of vision.    Past Medical History:  Diagnosis Date   Acute combined systolic and diastolic heart failure (HCC) 05/26/2015   ALLERGIC  RHINITIS    Allergic urticaria 08/01/2014   Patient reports allergies to shrimp, egg whites, tomatoes, dairy    Allergy     seasonal and numerous food and drug allergies.   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   Office Spirometry 11/07/17-WNL-FVC 1.58/82%, FEV1 1.27/85%, ratio 0.80, FEF 25-75% 0.26/89%   Bradycardia 02/25/2017   CAD (coronary artery disease) 05/01/2023   Myoview  03/26/2017: No ischemia Non-obs dz - CCTA 10/31/2019: CAC score 127; RCA proximal and mid 25-49, LAD proximal and mid 0-25    Chiari malformation    Noted MRI brain 09/2013 - s/p neuro eval for same   DIVERTICULOSIS, COLON    Essential hypertension 08/28/2010   Qualifier: Diagnosis of  By: Georganne Kind RMA, Lucy      Frequent headaches    GERD    H/O measles    H/O mumps    Hearing loss 01/18/2017   History of chicken pox    Hyperglycemia 03/05/2016   HYPERTENSION    Hypocalcemia 02/25/2017   IBS (irritable bowel syndrome) 05/26/2015   Nonspecific abnormal electrocardiogram (ECG) (EKG)    OBSTRUCTIVE SLEEP APNEA 12/2008 dx   noncompliant with CPAP qhs   Obstructive sleep apnea 08/28/2010   NPSG 12/2008:  AHI 13/hr with desats to 78%    Preventative health care 06/11/2017   Sciatica    right side   Seasonal and perennial allergic rhinitis 08/28/2010   Allergy  profile 03/11/14- positive especially for dust, cat and dog Food allergy  profile- total IgE 86 with several food group elevations Sed rate-WNL      Sinusitis 06/06/2017   Sleep  apnea    uses cpap   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   Vaginitis 01/18/2017   Vitamin D  deficiency 06/11/2017    Past Surgical History:  Procedure Laterality Date   ABDOMINAL HYSTERECTOMY  1986   BIOPSY  07/19/2022   Procedure: BIOPSY;  Surgeon: Annis Kinder, DO;  Location: [facility];  Service: Gastroenterology;;   BREAST SURGERY  1973   Breast biopsy   COLONOSCOPY  2011   Dr Tova Fresh   ESOPHAGOGASTRODUODENOSCOPY (EGD) WITH PROPOFOL  N/A 07/19/2022   Procedure:  ESOPHAGOGASTRODUODENOSCOPY (EGD) WITH PROPOFOL ;  Surgeon: Annis Kinder, DO;  Location: [facility];  Service: Gastroenterology;  Laterality: N/A;   UMBILICAL HERNIA REPAIR      Family History  Problem Relation Age of Onset   Arthritis Mother        died of complications from hip surgery   Arthritis Father    Kidney disease Father    Pneumonia Father    Heart disease Father    Kidney disease Sister        dialysis   Obesity Sister    Hypertension Sister    Kidney disease Sister        dialysis   Diabetes Brother    Heart disease Brother    Heart attack Brother    Alcohol abuse Brother    Throat cancer Brother    Lung cancer Brother    Pneumonia Brother    Lupus Daughter    Rheum arthritis Daughter    Sjogren's syndrome Daughter    Fibromyalgia Daughter    Hypertension Daughter    ALS Daughter    Murrell Arrant' disease Daughter    Hypertension Daughter    Anxiety disorder Maternal Uncle    Hypertension Maternal Grandfather    Alcohol abuse Other        parent   Ataxia Neg Hx    Chorea Neg Hx    Dementia Neg Hx    Mental retardation Neg Hx    Migraines Neg Hx    Multiple sclerosis Neg Hx    Neurofibromatosis Neg Hx    Neuropathy Neg Hx    Parkinsonism Neg Hx    Seizures Neg Hx    Stroke Neg Hx    Colon cancer Neg Hx    Colon polyps Neg Hx    Esophageal cancer Neg Hx    Rectal cancer Neg Hx    Stomach cancer Neg Hx     Social History   Socioeconomic History   Marital status: Widowed    Spouse name: Not on file   Number of children: 5   Years of education: Not on file   Highest education level: Not on file  Occupational History   Occupation: retired  Tobacco Use   Smoking status: Never   Smokeless tobacco: Never   Tobacco comments:    dtr 2 g-kids.  Vaping Use   Vaping status: Never Used  Substance and Sexual Activity   Alcohol use: No    Comment: rare   Drug use: No   Sexual activity: Not Currently  Other Topics Concern   Not on file  Social  History Narrative   Right handed   One story with daughter and two grandkids   Social Drivers of Health   Financial Resource Strain: Low Risk  (06/04/2022)   Overall Financial Resource Strain (CARDIA)    Difficulty of Paying Living Expenses: Not hard at all  Food Insecurity: No Food Insecurity (06/04/2022)   Hunger Vital Sign  Worried About Programme researcher, broadcasting/film/video in the Last Year: Never true    Ran Out of Food in the Last Year: Never true  Transportation Needs: No Transportation Needs (06/04/2022)   PRAPARE - Administrator, Civil Service (Medical): No    Lack of Transportation (Non-Medical): No  Physical Activity: Insufficiently Active (06/04/2022)   Exercise Vital Sign    Days of Exercise per Week: 4 days    Minutes of Exercise per Session: 30 min  Stress: No Stress Concern Present (06/04/2022)   [employer] of Occupational Health - Occupational Stress Questionnaire    Feeling of Stress : Not at all  Social Connections: Moderately Isolated (06/04/2022)   Social Connection and Isolation Panel [NHANES]    Frequency of Communication with Friends and Family: More than three times a week    Frequency of Social Gatherings with Friends and Family: More than three times a week    Attends Religious Services: More than 4 times per year    Active Member of [employer] or Organizations: No    Attends Banker Meetings: Never    Marital Status: Divorced  Catering manager Violence: Not At Risk (06/04/2022)   Humiliation, Afraid, Rape, and Kick questionnaire    Fear of Current or Ex-Partner: No    Emotionally Abused: No    Physically Abused: No    Sexually Abused: No    Outpatient Medications Prior to Visit  Medication Sig Dispense Refill   albuterol  (VENTOLIN  HFA) 108 (90 Base) MCG/ACT inhaler INHALE 2 PUFFS BY MOUTH EVERY 6 HOURS AS NEEDED FOR WHEEZING FOR SHORTNESS OF BREATH 18 g 12   alendronate  (FOSAMAX ) 70 MG tablet Take 1 tablet (70 mg total) by mouth once a week.  Take with a full glass of water on an empty stomach. 4 tablet 5   ALPRAZolam  (XANAX ) 0.5 MG tablet TAKE 1 TABLET BY MOUTH TWICE DAILY AS NEEDED FOR ANXIETY AND FOR SLEEP 20 tablet 0   amLODipine  (NORVASC ) 2.5 MG tablet Take 1 tablet (2.5 mg total) by mouth daily. 30 tablet 3   aspirin  EC 81 MG tablet Take 81 mg by mouth daily. Swallow whole.     azelastine  (ASTELIN ) 0.1 % nasal spray Place 2 sprays into both nostrils 2 (two) times daily as needed for rhinitis. 30 mL 5   azelastine  (OPTIVAR ) 0.05 % ophthalmic solution Place 1 drop into both eyes 2 (two) times daily as needed. 6 mL 5   b complex vitamins tablet Take 1 tablet by mouth daily.     Cholecalciferol (VITAMIN D ) 50 MCG (2000 UT) tablet Take 2,000 Units by mouth daily.     empagliflozin  (JARDIANCE ) 10 MG TABS tablet Take 1 tablet (10 mg total) by mouth daily before breakfast. 90 tablet 3   fluticasone  (FLONASE ) 50 MCG/ACT nasal spray Use 1-2 sprays in each nostril once  a day as needed for stuffy nose. In the right nostril, point the applicator out toward the right ear. In the left nostril, point the applicator out toward the left ear 16 g 5   furosemide  (LASIX ) 20 MG tablet Take 1 tablet (20 mg total) by mouth 3 (three) times a week. Monday, Wednesday and Friday. 90 tablet 3   levocetirizine (XYZAL ) 5 MG tablet TAKE 1 TABLET BY MOUTH ONCE DAILY IN THE EVENING 30 tablet 5   montelukast  (SINGULAIR ) 10 MG tablet TAKE 1 TABLET BY MOUTH AT BEDTIME 30 tablet 5   Multiple Vitamin (MULTIVITAMIN) tablet Take 1 tablet by  mouth daily.     nitroGLYCERIN  (NITROSTAT ) 0.4 MG SL tablet Place 1 tablet (0.4 mg total) under the tongue every 5 (five) minutes as needed for chest pain. 25 tablet 1   rosuvastatin  (CRESTOR ) 10 MG tablet Take 1 tablet (10 mg total) by mouth daily. 90 tablet 2   spironolactone  (ALDACTONE ) 25 MG tablet Take 1 tablet (25 mg total) by mouth daily. 90 tablet 2   VENTOLIN  HFA 108 (90 Base) MCG/ACT inhaler Inhale 2 puffs into the lungs  every 4 (four) hours as needed for wheezing or shortness of breath. 18 g 1   valsartan  (DIOVAN ) 320 MG tablet Take 1 tablet (320 mg total) by mouth daily. 90 tablet 3   No facility-administered medications prior to visit.    Allergies  Allergen Reactions   Codeine Nausea And Vomiting   Hydralazine  Hcl Other (See Comments)    Pt reports causes dizziness   Lisinopril-Hydrochlorothiazide Hives    REACTION: lip swelling,facial swelling,rash.   Metoprolol  Other (See Comments)    Does not feel well on it    Other Cough    Patient states she is allergic to Shrimp, egg whites, tomatoes and dairy products.  Says they make her feel unwell but do not cause hives, SOB or anaphylaxis.  She just avoids eating them because they can make her have a headache.  Strawberries, watermelon, peanuts, and all shellfish.    environmental   Shrimp [Shellfish Allergy ] Other (See Comments)    Was allergy  tested and showed shrimp was one--has not reacted b/c avoided    Review of Systems  Constitutional:  Positive for malaise/fatigue. Negative for fever.  HENT:  Positive for hearing loss and tinnitus. Negative for congestion, ear discharge, ear pain and nosebleeds.   Eyes:  Negative for blurred vision.  Respiratory:  Negative for shortness of breath.   Cardiovascular:  Negative for chest pain, palpitations and leg swelling.  Gastrointestinal:  Negative for abdominal pain, blood in stool and nausea.  Genitourinary:  Negative for dysuria and frequency.  Musculoskeletal:  Positive for joint pain. Negative for falls.  Skin:  Negative for rash.  Neurological:  Negative for dizziness, loss of consciousness and headaches.  Endo/Heme/Allergies:  Negative for environmental allergies.  Psychiatric/Behavioral:  Negative for depression. The patient is not nervous/anxious.        Objective:    Physical Exam Constitutional:      General: She is not in acute distress.    Appearance: Normal appearance. She is  well-developed. She is not toxic-appearing.  HENT:     Head: Normocephalic and atraumatic.     Right Ear: External ear normal.     Left Ear: External ear normal.     Nose: Nose normal.  Eyes:     General:        Right eye: No discharge.        Left eye: No discharge.     Conjunctiva/sclera: Conjunctivae normal.  Neck:     Thyroid : No thyromegaly.  Cardiovascular:     Rate and Rhythm: Normal rate.     Heart sounds: Normal heart sounds. No murmur heard. Pulmonary:     Effort: Pulmonary effort is normal. No respiratory distress.     Breath sounds: Normal breath sounds.  Abdominal:     General: Bowel sounds are normal.     Palpations: Abdomen is soft.     Tenderness: There is no abdominal tenderness. There is no guarding.  Musculoskeletal:        General:  Normal range of motion.     Cervical back: Neck supple.  Lymphadenopathy:     Cervical: No cervical adenopathy.  Skin:    General: Skin is warm and dry.  Neurological:     Mental Status: She is alert and oriented to person, place, and time.  Psychiatric:        Mood and Affect: Mood normal.        Behavior: Behavior normal.        Thought Content: Thought content normal.        Judgment: Judgment normal.     BP 138/70   Pulse 68   Resp 16   Ht 4\' 11"  (1.499 m)   Wt 223 lb 9.6 oz (101.4 kg)   SpO2 97%   BMI 45.16 kg/m  Wt Readings from Last 3 Encounters:  01/23/24 223 lb 9.6 oz (101.4 kg)  10/30/23 221 lb (100.2 kg)  09/27/23 225 lb 3.2 oz (102.2 kg)    Diabetic Foot Exam - Simple   No data filed    Lab Results  Component Value Date   WBC 10.1 07/25/2023   HGB 13.2 07/25/2023   HCT 40.9 07/25/2023   PLT 241.0 07/25/2023   GLUCOSE 89 09/24/2023   CHOL 126 07/25/2023   TRIG 48.0 07/25/2023   HDL 40.50 07/25/2023   LDLCALC 75 07/25/2023   ALT 18 09/24/2023   AST 18 09/24/2023   NA 140 09/24/2023   K 4.6 09/24/2023   CL 103 09/24/2023   CREATININE 1.06 09/24/2023   BUN 24 (H) 09/24/2023   CO2 29  09/24/2023   TSH 1.19 07/25/2023   INR 1.06 01/10/2015   HGBA1C 5.4 07/25/2023    Lab Results  Component Value Date   TSH 1.19 07/25/2023   Lab Results  Component Value Date   WBC 10.1 07/25/2023   HGB 13.2 07/25/2023   HCT 40.9 07/25/2023   MCV 91.0 07/25/2023   PLT 241.0 07/25/2023   Lab Results  Component Value Date   NA 140 09/24/2023   K 4.6 09/24/2023   CO2 29 09/24/2023   GLUCOSE 89 09/24/2023   BUN 24 (H) 09/24/2023   CREATININE 1.06 09/24/2023   BILITOT 0.5 09/24/2023   ALKPHOS 73 09/24/2023   AST 18 09/24/2023   ALT 18 09/24/2023   PROT 7.4 09/24/2023   ALBUMIN 4.3 09/24/2023   CALCIUM  9.3 09/24/2023   ANIONGAP 9 09/15/2017   EGFR 51 (L) 06/12/2023   GFR 51.20 (L) 09/24/2023   Lab Results  Component Value Date   CHOL 126 07/25/2023   Lab Results  Component Value Date   HDL 40.50 07/25/2023   Lab Results  Component Value Date   LDLCALC 75 07/25/2023   Lab Results  Component Value Date   TRIG 48.0 07/25/2023   Lab Results  Component Value Date   CHOLHDL 3 07/25/2023   Lab Results  Component Value Date   HGBA1C 5.4 07/25/2023       Assessment & Plan:  Chronic heart failure with preserved ejection fraction (HCC) Assessment & Plan: Asymptomatic, continue to monitor   Essential hypertension Assessment & Plan: Numbers have been elevated recently. Encouraged heart healthy diet such as the DASH diet and exercise as tolerated.    Orders: -     CBC with Differential/Platelet -     TSH -     Comprehensive metabolic panel with GFR; Future  Hyperglycemia Assessment & Plan: hgba1c acceptable, minimize simple carbs. Increase exercise as tolerated.  Orders: -     Hemoglobin A1c  Pure hypercholesterolemia Assessment & Plan: Tolerating statin, encouraged heart healthy diet, avoid trans fats, minimize simple carbs and saturated fats. Increase exercise as tolerated. Is tolerating Rosuvastatin   Orders: -     Lipid panel  Morbid obesity  (HCC) Assessment & Plan: Encouraged DASH or MIND diet, decrease po intake and increase exercise as tolerated. Needs 7-8 hours of sleep nightly. Avoid trans fats, eat small, frequent meals every 4-5 hours with lean proteins, complex carbs and healthy fats. Minimize simple carbs, high fat foods and processed foods    Vitamin D  deficiency Assessment & Plan: Supplement and monitor  Orders: -     VITAMIN D  25 Hydroxy (Vit-D Deficiency, Fractures)  Osteoporosis, unspecified osteoporosis type, unspecified pathological fracture presence Assessment & Plan: Encouraged to get adequate exercise, calcium  and vitamin d  intake    Tinnitus, unspecified laterality -     Ambulatory referral to Audiology  Hearing loss, unspecified hearing loss type, unspecified laterality -     Ambulatory referral to Audiology  Other orders -     Valsartan ; Take 1 tablet (40 mg total) by mouth daily.  Dispense: 30 tablet; Refill: 5    Assessment and Plan Assessment & Plan Wellness Visit Routine wellness visit to assess overall health and manage chronic conditions. - Order blood work to monitor glucose, vitamin D , and renal function. - Encourage hydration with 10 ounces of water every 1-2 hours, aiming for 60-80 ounces daily. - Recommend a diet rich in proteins and vegetables, minimizing carbohydrates. - Promote physical activity, aiming for 4,000 steps daily.  Essential hypertension Blood pressure is well-controlled on amlodipine . Discussed valsartan 's role in renal protection and blood pressure management. - Prescribe valsartan  40 mg daily. - Schedule blood pressure monitoring in 4-6 weeks with a nurse visit.  Chronic kidney disease, unspecified Renal function is improving with creatinine levels decreasing. Jardiance  is providing renal protection. - Continue current medications including Jardiance  for renal protection. - Monitor renal function with blood work in 4-6 weeks.  Chronic heart failure with  preserved ejection fraction Heart condition is well-managed with current medications. Jardiance  is providing cardiac protection. - Continue current medications including spironolactone  and Jardiance . - Encourage dietary modifications and physical activity to support cardiac health.  Dizziness and lightheadedness Intermittent dizziness and lightheadedness, possibly related to hydration and sinus pressure. - Encourage consistent hydration and protein intake. - Monitor symptoms and report any escalation.  Tinnitus Intermittent ringing in the right ear, possibly related to medication or other factors. No prior audiology evaluation. - Refer to [facility] for a hearing evaluation.  Cataracts Bilateral cataracts with better vision in the left eye. Surgery recommended but she is hesitant. - Encourage consideration of cataract surgery for improved vision and cognitive health.     Randie Bustle, MD

## 2024-01-23 NOTE — Patient Instructions (Signed)
 Hypertension, Adult High blood pressure (hypertension) is when the force of blood pumping through the arteries is too strong. The arteries are the blood vessels that carry blood from the heart throughout the body. Hypertension forces the heart to work harder to pump blood and may cause arteries to become narrow or stiff. Untreated or uncontrolled hypertension can lead to a heart attack, heart failure, a stroke, kidney disease, and other problems. A blood pressure reading consists of a higher number over a lower number. Ideally, your blood pressure should be below 120/80. The first ("top") number is called the systolic pressure. It is a measure of the pressure in your arteries as your heart beats. The second ("bottom") number is called the diastolic pressure. It is a measure of the pressure in your arteries as the heart relaxes. What are the causes? The exact cause of this condition is not known. There are some conditions that result in high blood pressure. What increases the risk? Certain factors may make you more likely to develop high blood pressure. Some of these risk factors are under your control, including: Smoking. Not getting enough exercise or physical activity. Being overweight. Having too much fat, sugar, calories, or salt (sodium) in your diet. Drinking too much alcohol. Other risk factors include: Having a personal history of heart disease, diabetes, high cholesterol, or kidney disease. Stress. Having a family history of high blood pressure and high cholesterol. Having obstructive sleep apnea. Age. The risk increases with age. What are the signs or symptoms? High blood pressure may not cause symptoms. Very high blood pressure (hypertensive crisis) may cause: Headache. Fast or irregular heartbeats (palpitations). Shortness of breath. Nosebleed. Nausea and vomiting. Vision changes. Severe chest pain, dizziness, and seizures. How is this diagnosed? This condition is diagnosed by  measuring your blood pressure while you are seated, with your arm resting on a flat surface, your legs uncrossed, and your feet flat on the floor. The cuff of the blood pressure monitor will be placed directly against the skin of your upper arm at the level of your heart. Blood pressure should be measured at least twice using the same arm. Certain conditions can cause a difference in blood pressure between your right and left arms. If you have a high blood pressure reading during one visit or you have normal blood pressure with other risk factors, you may be asked to: Return on a different day to have your blood pressure checked again. Monitor your blood pressure at home for 1 week or longer. If you are diagnosed with hypertension, you may have other blood or imaging tests to help your health care provider understand your overall risk for other conditions. How is this treated? This condition is treated by making healthy lifestyle changes, such as eating healthy foods, exercising more, and reducing your alcohol intake. You may be referred for counseling on a healthy diet and physical activity. Your health care provider may prescribe medicine if lifestyle changes are not enough to get your blood pressure under control and if: Your systolic blood pressure is above 130. Your diastolic blood pressure is above 80. Your personal target blood pressure may vary depending on your medical conditions, your age, and other factors. Follow these instructions at home: Eating and drinking  Eat a diet that is high in fiber and potassium, and low in sodium, added sugar, and fat. An example of this eating plan is called the DASH diet. DASH stands for Dietary Approaches to Stop Hypertension. To eat this way: Eat  plenty of fresh fruits and vegetables. Try to fill one half of your plate at each meal with fruits and vegetables. Eat whole grains, such as whole-wheat pasta, brown rice, or whole-grain bread. Fill about one  fourth of your plate with whole grains. Eat or drink low-fat dairy products, such as skim milk or low-fat yogurt. Avoid fatty cuts of meat, processed or cured meats, and poultry with skin. Fill about one fourth of your plate with lean proteins, such as fish, chicken without skin, beans, eggs, or tofu. Avoid pre-made and processed foods. These tend to be higher in sodium, added sugar, and fat. Reduce your daily sodium intake. Many people with hypertension should eat less than 1,500 mg of sodium a day. Do not drink alcohol if: Your health care provider tells you not to drink. You are pregnant, may be pregnant, or are planning to become pregnant. If you drink alcohol: Limit how much you have to: 0-1 drink a day for women. 0-2 drinks a day for men. Know how much alcohol is in your drink. In the [place] one drink equals one 12 oz bottle of beer (355 mL), one 5 oz glass of wine (148 mL), or one 1 oz glass of hard liquor (44 mL). Lifestyle  Work with your health care provider to maintain a healthy body weight or to lose weight. Ask what an ideal weight is for you. Get at least 30 minutes of exercise that causes your heart to beat faster (aerobic exercise) most days of the week. Activities may include walking, swimming, or biking. Include exercise to strengthen your muscles (resistance exercise), such as Pilates or lifting weights, as part of your weekly exercise routine. Try to do these types of exercises for 30 minutes at least 3 days a week. Do not use any products that contain nicotine or tobacco. These products include cigarettes, chewing tobacco, and vaping devices, such as e-cigarettes. If you need help quitting, ask your health care provider. Monitor your blood pressure at home as told by your health care provider. Keep all follow-up visits. This is important. Medicines Take over-the-counter and prescription medicines only as told by your health care provider. Follow directions carefully. Blood  pressure medicines must be taken as prescribed. Do not skip doses of blood pressure medicine. Doing this puts you at risk for problems and can make the medicine less effective. Ask your health care provider about side effects or reactions to medicines that you should watch for. Contact a health care provider if you: Think you are having a reaction to a medicine you are taking. Have headaches that keep coming back (recurring). Feel dizzy. Have swelling in your ankles. Have trouble with your vision. Get help right away if you: Develop a severe headache or confusion. Have unusual weakness or numbness. Feel faint. Have severe pain in your chest or abdomen. Vomit repeatedly. Have trouble breathing. These symptoms may be an emergency. Get help right away. Call 911. Do not wait to see if the symptoms will go away. Do not drive yourself to the hospital. Summary Hypertension is when the force of blood pumping through your arteries is too strong. If this condition is not controlled, it may put you at risk for serious complications. Your personal target blood pressure may vary depending on your medical conditions, your age, and other factors. For most people, a normal blood pressure is less than 120/80. Hypertension is treated with lifestyle changes, medicines, or a combination of both. Lifestyle changes include losing weight, eating a healthy,  low-sodium diet, exercising more, and limiting alcohol. This information is not intended to replace advice given to you by your health care provider. Make sure you discuss any questions you have with your health care provider. Document Revised: 07/18/2021 Document Reviewed: 07/18/2021 Elsevier Patient Education  2024 [employer].

## 2024-01-24 ENCOUNTER — Ambulatory Visit (INDEPENDENT_AMBULATORY_CARE_PROVIDER_SITE_OTHER): Payer: Self-pay

## 2024-01-24 DIAGNOSIS — J309 Allergic rhinitis, unspecified: Secondary | ICD-10-CM

## 2024-01-24 LAB — LIPID PANEL
Cholesterol: 159 mg/dL (ref 0–200)
HDL: 47.3 mg/dL (ref >39.00)
LDL Cholesterol: 101 mg/dL — ABNORMAL HIGH (ref 0–99)
NonHDL: 111.65
Total CHOL/HDL Ratio: 3
Triglycerides: 51 mg/dL (ref 0.0–149.0)
VLDL: 10.2 mg/dL (ref 0.0–40.0)

## 2024-01-24 LAB — CBC WITH DIFFERENTIAL/PLATELET
Basophils Absolute: 0.1 10*3/uL (ref 0.0–0.1)
Basophils Relative: 1 % (ref 0.0–3.0)
Eosinophils Absolute: 0.3 10*3/uL (ref 0.0–0.7)
Eosinophils Relative: 3 % (ref 0.0–5.0)
HCT: 44.6 % (ref 36.0–46.0)
Hemoglobin: 14.2 g/dL (ref 12.0–15.0)
Lymphocytes Relative: 15.1 % (ref 12.0–46.0)
Lymphs Abs: 1.6 10*3/uL (ref 0.7–4.0)
MCHC: 31.9 g/dL (ref 30.0–36.0)
MCV: 92.4 fl (ref 78.0–100.0)
Monocytes Absolute: 0.8 10*3/uL (ref 0.1–1.0)
Monocytes Relative: 7.6 % (ref 3.0–12.0)
Neutro Abs: 7.6 10*3/uL (ref 1.4–7.7)
Neutrophils Relative %: 73.3 % (ref 43.0–77.0)
Platelets: 276 10*3/uL (ref 150.0–400.0)
RBC: 4.83 Mil/uL (ref 3.87–5.11)
RDW: 15 % (ref 11.5–15.5)
WBC: 10.3 10*3/uL (ref 4.0–10.5)

## 2024-01-24 LAB — TSH: TSH: 1.26 u[IU]/mL (ref 0.35–5.50)

## 2024-01-24 LAB — VITAMIN D 25 HYDROXY (VIT D DEFICIENCY, FRACTURES): VITD: 50.94 ng/mL (ref 30.00–100.00)

## 2024-01-24 LAB — HEMOGLOBIN A1C: Hgb A1c MFr Bld: 5.6 % (ref 4.6–6.5)

## 2024-01-24 MED ORDER — EPINEPHRINE 0.3 MG/0.3ML IJ SOAJ: 0.3000 mg | INTRAMUSCULAR | 2 refills | Status: AC | PRN

## 2024-01-26 ENCOUNTER — Encounter: Payer: Self-pay | Admitting: Family Medicine

## 2024-01-30 ENCOUNTER — Ambulatory Visit (HOSPITAL_COMMUNITY): Admit: 2024-01-30 | Admitting: Cardiology

## 2024-01-30 ENCOUNTER — Inpatient Hospital Stay (HOSPITAL_COMMUNITY)
Admission: EM | Admit: 2024-01-30 | Discharge: 2024-01-31 | DRG: 322 | Disposition: A | Discharge status: Home / Self Care | Attending: Cardiology | Admitting: Cardiology

## 2024-01-30 ENCOUNTER — Other Ambulatory Visit: Payer: Self-pay

## 2024-01-30 ENCOUNTER — Encounter (HOSPITAL_COMMUNITY)
Admission: EM | Disposition: A | Discharge status: Home / Self Care | Payer: Self-pay | Source: Home / Self Care | Attending: Cardiology

## 2024-01-30 DIAGNOSIS — I110 Hypertensive heart disease with heart failure: Secondary | ICD-10-CM | POA: Diagnosis present

## 2024-01-30 DIAGNOSIS — Z91013 Allergy to seafood: Secondary | ICD-10-CM

## 2024-01-30 DIAGNOSIS — I2111 ST elevation (STEMI) myocardial infarction involving right coronary artery: Secondary | ICD-10-CM

## 2024-01-30 DIAGNOSIS — I252 Old myocardial infarction: Secondary | ICD-10-CM

## 2024-01-30 DIAGNOSIS — R079 Chest pain, unspecified: Secondary | ICD-10-CM | POA: Diagnosis not present

## 2024-01-30 DIAGNOSIS — E119 Type 2 diabetes mellitus without complications: Secondary | ICD-10-CM | POA: Diagnosis present

## 2024-01-30 DIAGNOSIS — K219 Gastro-esophageal reflux disease without esophagitis: Secondary | ICD-10-CM | POA: Diagnosis not present

## 2024-01-30 DIAGNOSIS — E785 Hyperlipidemia, unspecified: Secondary | ICD-10-CM | POA: Diagnosis present

## 2024-01-30 DIAGNOSIS — I2510 Atherosclerotic heart disease of native coronary artery without angina pectoris: Secondary | ICD-10-CM | POA: Diagnosis not present

## 2024-01-30 DIAGNOSIS — Z91011 Allergy to milk products: Secondary | ICD-10-CM

## 2024-01-30 DIAGNOSIS — Z91010 Allergy to peanuts: Secondary | ICD-10-CM

## 2024-01-30 DIAGNOSIS — I499 Cardiac arrhythmia, unspecified: Secondary | ICD-10-CM | POA: Diagnosis not present

## 2024-01-30 DIAGNOSIS — H9190 Unspecified hearing loss, unspecified ear: Secondary | ICD-10-CM | POA: Diagnosis present

## 2024-01-30 DIAGNOSIS — Z90710 Acquired absence of both cervix and uterus: Secondary | ICD-10-CM | POA: Diagnosis not present

## 2024-01-30 DIAGNOSIS — Z7984 Long term (current) use of oral hypoglycemic drugs: Secondary | ICD-10-CM | POA: Diagnosis not present

## 2024-01-30 DIAGNOSIS — Z885 Allergy status to narcotic agent status: Secondary | ICD-10-CM

## 2024-01-30 DIAGNOSIS — I213 ST elevation (STEMI) myocardial infarction of unspecified site: Secondary | ICD-10-CM | POA: Diagnosis not present

## 2024-01-30 DIAGNOSIS — Z91018 Allergy to other foods: Secondary | ICD-10-CM

## 2024-01-30 DIAGNOSIS — G4733 Obstructive sleep apnea (adult) (pediatric): Secondary | ICD-10-CM | POA: Diagnosis present

## 2024-01-30 DIAGNOSIS — I2119 ST elevation (STEMI) myocardial infarction involving other coronary artery of inferior wall: Secondary | ICD-10-CM | POA: Diagnosis not present

## 2024-01-30 DIAGNOSIS — Z7983 Long term (current) use of bisphosphonates: Secondary | ICD-10-CM | POA: Diagnosis not present

## 2024-01-30 DIAGNOSIS — Z888 Allergy status to other drugs, medicaments and biological substances status: Secondary | ICD-10-CM | POA: Diagnosis not present

## 2024-01-30 DIAGNOSIS — Z79899 Other long term (current) drug therapy: Secondary | ICD-10-CM | POA: Diagnosis not present

## 2024-01-30 DIAGNOSIS — Z833 Family history of diabetes mellitus: Secondary | ICD-10-CM

## 2024-01-30 DIAGNOSIS — R0789 Other chest pain: Secondary | ICD-10-CM | POA: Diagnosis not present

## 2024-01-30 DIAGNOSIS — I491 Atrial premature depolarization: Secondary | ICD-10-CM | POA: Diagnosis not present

## 2024-01-30 DIAGNOSIS — J3089 Other allergic rhinitis: Secondary | ICD-10-CM | POA: Diagnosis present

## 2024-01-30 DIAGNOSIS — I5042 Chronic combined systolic (congestive) and diastolic (congestive) heart failure: Secondary | ICD-10-CM | POA: Diagnosis present

## 2024-01-30 DIAGNOSIS — Z8249 Family history of ischemic heart disease and other diseases of the circulatory system: Secondary | ICD-10-CM | POA: Diagnosis not present

## 2024-01-30 DIAGNOSIS — Z955 Presence of coronary angioplasty implant and graft: Secondary | ICD-10-CM

## 2024-01-30 DIAGNOSIS — I10 Essential (primary) hypertension: Secondary | ICD-10-CM | POA: Diagnosis not present

## 2024-01-30 DIAGNOSIS — Z7982 Long term (current) use of aspirin: Secondary | ICD-10-CM | POA: Diagnosis not present

## 2024-01-30 HISTORY — PX: CORONARY/GRAFT ACUTE MI REVASCULARIZATION: CATH118305

## 2024-01-30 HISTORY — PX: LEFT HEART CATH AND CORONARY ANGIOGRAPHY: CATH118249

## 2024-01-30 LAB — CBC
HCT: 43 % (ref 36.0–46.0)
Hemoglobin: 13.8 g/dL (ref 12.0–15.0)
MCH: 29.4 pg (ref 26.0–34.0)
MCHC: 32.1 g/dL (ref 30.0–36.0)
MCV: 91.5 fL (ref 80.0–100.0)
Platelets: 256 10*3/uL (ref 150–400)
RBC: 4.7 MIL/uL (ref 3.87–5.11)
RDW: 13.8 % (ref 11.5–15.5)
WBC: 11.6 10*3/uL — ABNORMAL HIGH (ref 4.0–10.5)
nRBC: 0 % (ref 0.0–0.2)

## 2024-01-30 LAB — MRSA NEXT GEN BY PCR, NASAL: MRSA by PCR Next Gen: NOT DETECTED

## 2024-01-30 LAB — POCT ACTIVATED CLOTTING TIME: Activated Clotting Time: 314 s

## 2024-01-30 LAB — POCT I-STAT, CHEM 8
BUN: 20 mg/dL (ref 8–23)
Calcium, Ion: 1.22 mmol/L (ref 1.15–1.40)
Chloride: 104 mmol/L (ref 98–111)
Creatinine, Ser: 0.9 mg/dL (ref 0.44–1.00)
Glucose, Bld: 131 mg/dL — ABNORMAL HIGH (ref 70–99)
HCT: 42 % (ref 36.0–46.0)
Hemoglobin: 14.3 g/dL (ref 12.0–15.0)
Potassium: 3.8 mmol/L (ref 3.5–5.1)
Sodium: 140 mmol/L (ref 135–145)
TCO2: 27 mmol/L (ref 22–32)

## 2024-01-30 LAB — CREATININE, SERUM
Creatinine, Ser: 0.91 mg/dL (ref 0.44–1.00)
GFR, Estimated: >60 mL/min (ref >60)

## 2024-01-30 LAB — GLUCOSE, CAPILLARY: Glucose-Capillary: 96 mg/dL (ref 70–99)

## 2024-01-30 LAB — CG4 I-STAT (LACTIC ACID): Lactic Acid, Venous: 0.6 mmol/L (ref 0.5–1.9)

## 2024-01-30 MED ORDER — NITROGLYCERIN 0.4 MG SL SUBL: 0.4000 mg | SUBLINGUAL_TABLET | SUBLINGUAL | Status: DC | PRN

## 2024-01-30 MED ORDER — SODIUM CHLORIDE 0.9 % IV SOLN: INTRAVENOUS | Status: AC

## 2024-01-30 MED ORDER — ACETAMINOPHEN 325 MG PO TABS
650.0000 mg | ORAL_TABLET | ORAL | Status: DC | PRN
  Administered 2024-01-31 (×2): 650 mg via ORAL
  Filled 2024-01-30 (×2): qty 2

## 2024-01-30 MED ORDER — HEPARIN SODIUM (PORCINE) 1000 UNIT/ML IJ SOLN
INTRAMUSCULAR | Status: AC
  Filled 2024-01-30: qty 10

## 2024-01-30 MED ORDER — LEVOCETIRIZINE DIHYDROCHLORIDE 5 MG PO TABS: 5.0000 mg | ORAL_TABLET | Freq: Every evening | ORAL | Status: DC

## 2024-01-30 MED ORDER — HEPARIN SODIUM (PORCINE) 1000 UNIT/ML IJ SOLN
INTRAMUSCULAR | Status: DC | PRN
  Administered 2024-01-30: 10000 [IU] via INTRAVENOUS

## 2024-01-30 MED ORDER — ASPIRIN 81 MG PO CHEW: 81.0000 mg | CHEWABLE_TABLET | Freq: Every day | ORAL | Status: DC

## 2024-01-30 MED ORDER — ROSUVASTATIN CALCIUM 20 MG PO TABS
20.0000 mg | ORAL_TABLET | Freq: Every day | ORAL | Status: DC
  Administered 2024-01-30 – 2024-01-31 (×2): 20 mg via ORAL
  Filled 2024-01-30 (×2): qty 1

## 2024-01-30 MED ORDER — INSULIN ASPART 100 UNIT/ML IJ SOLN
0.0000 [IU] | Freq: Three times a day (TID) | INTRAMUSCULAR | Status: DC
  Administered 2024-01-31: 2 [IU] via SUBCUTANEOUS

## 2024-01-30 MED ORDER — EMPAGLIFLOZIN 10 MG PO TABS
10.0000 mg | ORAL_TABLET | Freq: Every day | ORAL | Status: DC
  Administered 2024-01-31: 10 mg via ORAL
  Filled 2024-01-30 (×2): qty 1

## 2024-01-30 MED ORDER — SODIUM CHLORIDE 0.9% FLUSH: 3.0000 mL | INTRAVENOUS | Status: DC | PRN

## 2024-01-30 MED ORDER — ENOXAPARIN SODIUM 40 MG/0.4ML IJ SOSY
40.0000 mg | PREFILLED_SYRINGE | INTRAMUSCULAR | Status: DC
  Administered 2024-01-31: 40 mg via SUBCUTANEOUS
  Filled 2024-01-30: qty 0.4

## 2024-01-30 MED ORDER — MONTELUKAST SODIUM 10 MG PO TABS
10.0000 mg | ORAL_TABLET | Freq: Every day | ORAL | Status: DC
  Administered 2024-01-30: 10 mg via ORAL
  Filled 2024-01-30 (×2): qty 1

## 2024-01-30 MED ORDER — ACETAMINOPHEN 325 MG PO TABS: 650.0000 mg | ORAL_TABLET | ORAL | Status: DC | PRN

## 2024-01-30 MED ORDER — SODIUM CHLORIDE 0.9% FLUSH
3.0000 mL | Freq: Two times a day (BID) | INTRAVENOUS | Status: DC
  Administered 2024-01-30 – 2024-01-31 (×2): 3 mL via INTRAVENOUS

## 2024-01-30 MED ORDER — LIDOCAINE HCL (PF) 1 % IJ SOLN
INTRAMUSCULAR | Status: AC
  Filled 2024-01-30: qty 30

## 2024-01-30 MED ORDER — LIDOCAINE HCL (PF) 1 % IJ SOLN
INTRAMUSCULAR | Status: DC | PRN
  Administered 2024-01-30: 2 mL

## 2024-01-30 MED ORDER — HEPARIN (PORCINE) IN NACL 1000-0.9 UT/500ML-% IV SOLN
INTRAVENOUS | Status: DC | PRN
  Administered 2024-01-30 (×2): 500 mL

## 2024-01-30 MED ORDER — CLOPIDOGREL BISULFATE 300 MG PO TABS
ORAL_TABLET | ORAL | Status: AC
Start: 2024-01-30 — End: ?
  Filled 2024-01-30: qty 2

## 2024-01-30 MED ORDER — SODIUM CHLORIDE 0.9 % IV BOLUS
INTRAVENOUS | Status: AC | PRN
  Administered 2024-01-30: 250 mL via INTRAVENOUS

## 2024-01-30 MED ORDER — NITROGLYCERIN 1 MG/10 ML FOR IR/CATH LAB
INTRA_ARTERIAL | Status: DC | PRN
  Administered 2024-01-30 (×2): 200 ug via INTRACORONARY

## 2024-01-30 MED ORDER — ONDANSETRON HCL 4 MG/2ML IJ SOLN
INTRAMUSCULAR | Status: DC | PRN
  Administered 2024-01-30: 4 mg via INTRAVENOUS

## 2024-01-30 MED ORDER — SODIUM CHLORIDE 0.9 % IV SOLN: 250.0000 mL | INTRAVENOUS | Status: DC | PRN

## 2024-01-30 MED ORDER — LORATADINE 10 MG PO TABS
10.0000 mg | ORAL_TABLET | Freq: Every day | ORAL | Status: DC
  Administered 2024-01-31: 10 mg via ORAL
  Filled 2024-01-30: qty 1

## 2024-01-30 MED ORDER — NITROGLYCERIN 1 MG/10 ML FOR IR/CATH LAB
INTRA_ARTERIAL | Status: AC
  Filled 2024-01-30: qty 10

## 2024-01-30 MED ORDER — CLOPIDOGREL BISULFATE 300 MG PO TABS
ORAL_TABLET | ORAL | Status: DC | PRN
  Administered 2024-01-30: 600 mg via ORAL

## 2024-01-30 MED ORDER — CLOPIDOGREL BISULFATE 75 MG PO TABS
75.0000 mg | ORAL_TABLET | Freq: Every day | ORAL | Status: DC
  Administered 2024-01-31: 75 mg via ORAL
  Filled 2024-01-30: qty 1

## 2024-01-30 MED ORDER — VERAPAMIL HCL 2.5 MG/ML IV SOLN
INTRAVENOUS | Status: AC
  Filled 2024-01-30: qty 2

## 2024-01-30 MED ORDER — ONDANSETRON HCL 4 MG/2ML IJ SOLN
INTRAMUSCULAR | Status: AC
  Filled 2024-01-30: qty 2

## 2024-01-30 MED ORDER — ASPIRIN 81 MG PO TBEC
81.0000 mg | DELAYED_RELEASE_TABLET | Freq: Every day | ORAL | Status: DC
  Administered 2024-01-31: 81 mg via ORAL
  Filled 2024-01-30: qty 1

## 2024-01-30 MED ORDER — ADULT MULTIVITAMIN W/MINERALS CH
1.0000 | ORAL_TABLET | Freq: Every day | ORAL | Status: DC
  Administered 2024-01-30 – 2024-01-31 (×2): 1 via ORAL
  Filled 2024-01-30 (×2): qty 1

## 2024-01-30 MED ORDER — HEPARIN (PORCINE) IN NACL 2-0.9 UNITS/ML
INTRAMUSCULAR | Status: DC | PRN
  Administered 2024-01-30: 10 mL via INTRA_ARTERIAL

## 2024-01-30 SURGICAL SUPPLY — 15 items
BALLOON EMERGE MR 2.5X12 (BALLOONS) IMPLANT
BALLOON SAPPHIRE NC24 4.0X15 (BALLOONS) IMPLANT
CATH 5FR JL3.5 JR4 ANG PIG MP (CATHETERS) IMPLANT
CATH LAUNCHER 6FR 3DRC (CATHETERS) IMPLANT
CATH LAUNCHER 6FR AL.75 (CATHETERS) IMPLANT
DEVICE RAD COMP TR BAND LRG (VASCULAR PRODUCTS) IMPLANT
GLIDESHEATH SLEND SS 6F .021 (SHEATH) IMPLANT
GUIDEWIRE INQWIRE 1.5J.035X260 (WIRE) IMPLANT
KIT ENCORE 26 ADVANTAGE (KITS) IMPLANT
PACK CARDIAC CATHETERIZATION (CUSTOM PROCEDURE TRAY) ×1 IMPLANT
SET ATX-X65L (MISCELLANEOUS) IMPLANT
SHEATH PROBE COVER 6X72 (BAG) IMPLANT
STENT SYNERGY XD 3.50X20 (Permanent Stent) IMPLANT
TUBING CIL FLEX 10 FLL-RA (TUBING) IMPLANT
WIRE ASAHI PROWATER 180CM (WIRE) IMPLANT

## 2024-01-30 NOTE — H&P (Signed)
 Cardiology Admission History and Physical   Patient ID: SAQUOIA Barnes MRN: [id number]; DOB: [birth year]   Admission date: 01/30/2024  PCP:  Neda Balk, MD   [facility] Providers Cardiologist:  Jann Melody, MD  Cardiology APP:  Gabino Joe, PA-C  }     Chief Complaint:  acute STEMI  Patient Profile:   Anne Barnes is a 78 y.o. female with nonobstructive CAD, hypertension, hyperlipidemia, HFpEF, OSA intolerant to CPAP, GERD, and obesity who is being seen 01/30/2024 for the evaluation of STEMI.  History of Present Illness:   [name] had a negative Myoview  2018 and mild nonobstructive disease on coronary CTA in 2021.  She has a history of diastolic heart failure, last echocardiogram 2025 with LVEF 35-60% with no RWMA, moderate concentric LVH, grade 2 DD.  She has a history of bradycardia on beta-blocker, confirmed on cardiac monitor in 2021.  She was last seen in clinic 10/30/2023 and reported occasional shortness of breath with exertion that she attributed to allergies.  She denied chest pain and syncope.  She sleeps in a recliner for comfort.  She was trying to increase her walking program at that time for weight loss.  She presented to [facility] ED with chest pain that started at 1130 this morning. According to her son she had some indigestion symptoms last night which was unusual for her. Today she had recurrent pain and EMS called.   Initial EKG showed ST elevation in inferior and lateral leads.  Code STEMI was activated and she was brought emergently to the Cath Lab for revascularization.  She received 324 mg aspirin  and heparin.   Past Medical History:  Diagnosis Date   Acute combined systolic and diastolic heart failure (HCC) 05/26/2015   ALLERGIC RHINITIS    Allergic urticaria 08/01/2014   Patient reports allergies to shrimp, egg whites, tomatoes, dairy    Allergy     seasonal and numerous food and drug allergies.   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    Office Spirometry 11/07/17-WNL-FVC 1.58/82%, FEV1 1.27/85%, ratio 0.80, FEF 25-75% 0.26/89%   Bradycardia 02/25/2017   CAD (coronary artery disease) 05/01/2023   Myoview  03/26/2017: No ischemia Non-obs dz - CCTA 10/31/2019: CAC score 127; RCA proximal and mid 25-49, LAD proximal and mid 0-25    Chiari malformation    Noted MRI brain 09/2013 - s/p neuro eval for same   DIVERTICULOSIS, COLON    Essential hypertension 08/28/2010   Qualifier: Diagnosis of  By: Georganne Kind RMA, Lucy      Frequent headaches    GERD    H/O measles    H/O mumps    Hearing loss 01/18/2017   History of chicken pox    Hyperglycemia 03/05/2016   HYPERTENSION    Hypocalcemia 02/25/2017   IBS (irritable bowel syndrome) 05/26/2015   Nonspecific abnormal electrocardiogram (ECG) (EKG)    OBSTRUCTIVE SLEEP APNEA 12/2008 dx   noncompliant with CPAP qhs   Obstructive sleep apnea 08/28/2010   NPSG 12/2008:  AHI 13/hr with desats to 78%    Preventative health care 06/11/2017   Sciatica    right side   Seasonal and perennial allergic rhinitis 08/28/2010   Allergy  profile 03/11/14- positive especially for dust, cat and dog Food allergy  profile- total IgE 86 with several food group elevations Sed rate-WNL      Sinusitis 06/06/2017   Sleep apnea    uses cpap   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   Vaginitis 01/18/2017   Vitamin  D deficiency 06/11/2017    Past Surgical History:  Procedure Laterality Date   ABDOMINAL HYSTERECTOMY  1986   BIOPSY  07/19/2022   Procedure: BIOPSY;  Surgeon: Annis Kinder, DO;  Location: [facility];  Service: Gastroenterology;;   BREAST SURGERY  1973   Breast biopsy   COLONOSCOPY  2011   Dr Tova Fresh   ESOPHAGOGASTRODUODENOSCOPY (EGD) WITH PROPOFOL  N/A 07/19/2022   Procedure: ESOPHAGOGASTRODUODENOSCOPY (EGD) WITH PROPOFOL ;  Surgeon: Annis Kinder, DO;  Location: [facility];  Service: Gastroenterology;  Laterality: N/A;   UMBILICAL HERNIA REPAIR       Medications Prior to  Admission: Prior to Admission medications   Medication Sig Start Date End Date Taking? Authorizing Provider  albuterol  (VENTOLIN  HFA) 108 (90 Base) MCG/ACT inhaler INHALE 2 PUFFS BY MOUTH EVERY 6 HOURS AS NEEDED FOR WHEEZING FOR SHORTNESS OF BREATH 07/12/23   Rosa College D, MD  alendronate  (FOSAMAX ) 70 MG tablet Take 1 tablet (70 mg total) by mouth once a week. Take with a full glass of water on an empty stomach. 01/31/23   Neda Balk, MD  ALPRAZolam  (XANAX ) 0.5 MG tablet TAKE 1 TABLET BY MOUTH TWICE DAILY AS NEEDED FOR ANXIETY AND FOR SLEEP 01/14/24   Neda Balk, MD  amLODipine  (NORVASC ) 2.5 MG tablet Take 1 tablet (2.5 mg total) by mouth daily. 09/24/23   Neda Balk, MD  aspirin  EC 81 MG tablet Take 81 mg by mouth daily. Swallow whole.    [provider]  azelastine  (ASTELIN ) 0.1 % nasal spray Place 2 sprays into both nostrils 2 (two) times daily as needed for rhinitis. 05/31/23   Brian Campanile, MD  azelastine  (OPTIVAR ) 0.05 % ophthalmic solution Place 1 drop into both eyes 2 (two) times daily as needed. 05/31/23   Brian Campanile, MD  b complex vitamins tablet Take 1 tablet by mouth daily.    [provider]  Cholecalciferol (VITAMIN D ) 50 MCG (2000 UT) tablet Take 2,000 Units by mouth daily.    [provider]  empagliflozin  (JARDIANCE ) 10 MG TABS tablet Take 1 tablet (10 mg total) by mouth daily before breakfast. 05/07/23   Marlyse Single T, PA-C  EPINEPHrine  0.3 mg/0.3 mL IJ SOAJ injection Inject 0.3 mg into the muscle as needed for anaphylaxis. 01/24/24   Brian Campanile, MD  fluticasone  (FLONASE ) 50 MCG/ACT nasal spray Use 1-2 sprays in each nostril once  a day as needed for stuffy nose. In the right nostril, point the applicator out toward the right ear. In the left nostril, point the applicator out toward the left ear 05/31/23   Brian Campanile, MD  furosemide  (LASIX ) 20 MG tablet Take 1 tablet (20 mg total) by mouth 3  (three) times a week. Monday, Wednesday and Friday. 04/28/21   Marlyse Single T, PA-C  levocetirizine (XYZAL ) 5 MG tablet TAKE 1 TABLET BY MOUTH ONCE DAILY IN THE EVENING 09/09/23   Brian Campanile, MD  montelukast  (SINGULAIR ) 10 MG tablet TAKE 1 TABLET BY MOUTH AT BEDTIME 01/03/24   Brian Campanile, MD  Multiple Vitamin (MULTIVITAMIN) tablet Take 1 tablet by mouth daily.    [provider]  nitroGLYCERIN  (NITROSTAT ) 0.4 MG SL tablet Place 1 tablet (0.4 mg total) under the tongue every 5 (five) minutes as needed for chest pain. 05/01/23   Marlyse Single T, PA-C  rosuvastatin  (CRESTOR ) 10 MG tablet Take 1 tablet (10 mg total) by mouth daily. 10/25/23   Marlyse Single T, PA-C  spironolactone  (  ALDACTONE ) 25 MG tablet Take 1 tablet (25 mg total) by mouth daily. 10/25/23   Marlyse Single T, PA-C  valsartan  (DIOVAN ) 40 MG tablet Take 1 tablet (40 mg total) by mouth daily. 01/23/24   Neda Balk, MD  VENTOLIN  HFA 108 (90 Base) MCG/ACT inhaler Inhale 2 puffs into the lungs every 4 (four) hours as needed for wheezing or shortness of breath. 05/31/23   Brian Campanile, MD     Allergies:    Allergies  Allergen Reactions   Codeine Nausea And Vomiting   Hydralazine  Hcl Other (See Comments)    Pt reports causes dizziness   Lisinopril-Hydrochlorothiazide Hives    REACTION: lip swelling,facial swelling,rash.   Metoprolol  Other (See Comments)    Does not feel well on it    Other Cough    Patient states she is allergic to Shrimp, egg whites, tomatoes and dairy products.  Says they make her feel unwell but do not cause hives, SOB or anaphylaxis.  She just avoids eating them because they can make her have a headache.  Strawberries, watermelon, peanuts, and all shellfish.    environmental   Shrimp [Shellfish Allergy ] Other (See Comments)    Was allergy  tested and showed shrimp was one--has not reacted b/c avoided    Social History:   Social History   Socioeconomic History    Marital status: Widowed    Spouse name: Not on file   Number of children: 5   Years of education: Not on file   Highest education level: Not on file  Occupational History   Occupation: retired  Tobacco Use   Smoking status: Never   Smokeless tobacco: Never   Tobacco comments:    dtr 2 g-kids.  Vaping Use   Vaping status: Never Used  Substance and Sexual Activity   Alcohol use: No    Comment: rare   Drug use: No   Sexual activity: Not Currently  Other Topics Concern   Not on file  Social History Narrative   Right handed   One story with daughter and two grandkids   Social Drivers of Corporate investment banker Strain: Low Risk  (06/04/2022)   Overall Financial Resource Strain (CARDIA)    Difficulty of Paying Living Expenses: Not hard at all  Food Insecurity: No Food Insecurity (06/04/2022)   Hunger Vital Sign    Worried About Running Out of Food in the Last Year: Never true    Ran Out of Food in the Last Year: Never true  Transportation Needs: No Transportation Needs (06/04/2022)   PRAPARE - Administrator, Civil Service (Medical): No    Lack of Transportation (Non-Medical): No  Physical Activity: Insufficiently Active (06/04/2022)   Exercise Vital Sign    Days of Exercise per Week: 4 days    Minutes of Exercise per Session: 30 min  Stress: No Stress Concern Present (06/04/2022)   [employer] of Occupational Health - Occupational Stress Questionnaire    Feeling of Stress : Not at all  Social Connections: Moderately Isolated (06/04/2022)   Social Connection and Isolation Panel [NHANES]    Frequency of Communication with Friends and Family: More than three times a week    Frequency of Social Gatherings with Friends and Family: More than three times a week    Attends Religious Services: More than 4 times per year    Active Member of [employer] or Organizations: No    Attends Banker Meetings: Never    Marital  Status: Divorced  Catering manager  Violence: Not At Risk (06/04/2022)   Humiliation, Afraid, Rape, and Kick questionnaire    Fear of Current or Ex-Partner: No    Emotionally Abused: No    Physically Abused: No    Sexually Abused: No    Family History:   The patient's family history includes ALS in her daughter; Alcohol abuse in her brother and another family member; Anxiety disorder in her maternal uncle; Arthritis in her father and mother; Diabetes in her brother; Fibromyalgia in her daughter; Murrell Arrant' disease in her daughter; Heart attack in her brother; Heart disease in her brother and father; Hypertension in her daughter, daughter, maternal grandfather, and sister; Kidney disease in her father, sister, and sister; Lung cancer in her brother; Lupus in her daughter; Obesity in her sister; Pneumonia in her brother and father; Rheum arthritis in her daughter; Sjogren's syndrome in her daughter; Throat cancer in her brother. There is no history of Ataxia, Chorea, Dementia, Mental retardation, Migraines, Multiple sclerosis, Neurofibromatosis, Neuropathy, Parkinsonism, Seizures, Stroke, Colon cancer, Colon polyps, Esophageal cancer, Rectal cancer, or Stomach cancer.    ROS:  Please see the history of present illness.  All other ROS reviewed and negative.     Physical Exam/Data:   Vitals:   01/30/24 1455  SpO2: 99%   No intake or output data in the 24 hours ending 01/30/24 1517    01/23/2024   11:36 AM 10/30/2023   10:35 AM 09/27/2023   10:21 AM  Last 3 Weights  Weight (lbs) 223 lb 9.6 oz 221 lb 225 lb 3.2 oz  Weight (kg) 101.424 kg 100.245 kg 102.15 kg     There is no height or weight on file to calculate BMI.  Obese BF in mild distress No JVD or bruits Lungs are clear. CV RRR no gallop or murmur Abd soft obese NT Pulses 2+= No edema Neuro intact.    EKG:  The ECG that was done was personally reviewed and demonstrates   Relevant CV Studies:   Laboratory Data:  High Sensitivity Troponin:  No results for input(s):  "TROPONINIHS" in the last 720 hours.    ChemistryNo results for input(s): "NA", "K", "CL", "CO2", "GLUCOSE", "BUN", "CREATININE", "CALCIUM ", "MG", "GFRNONAA", "GFRAA", "ANIONGAP" in the last 168 hours.  No results for input(s): "PROT", "ALBUMIN", "AST", "ALT", "ALKPHOS", "BILITOT" in the last 168 hours. Lipids No results for input(s): "CHOL", "TRIG", "HDL", "LABVLDL", "LDLCALC", "CHOLHDL" in the last 168 hours. HematologyNo results for input(s): "WBC", "RBC", "HGB", "HCT", "MCV", "MCH", "MCHC", "RDW", "PLT" in the last 168 hours. Thyroid  No results for input(s): "TSH", "FREET4" in the last 168 hours. BNPNo results for input(s): "BNP", "PROBNP" in the last 168 hours.  DDimer No results for input(s): "DDIMER" in the last 168 hours.   Radiology/Studies:  No results found.   Assessment and Plan:   Inferolateral STEMI She was emergently brought to the ED for Cath Lab for revascularization.  She received 424 mg aspirin  and heparin. - continue ASA - increase crestor  from 10 mg to 40 mg - lipid panel and LPA pending - obtain A1c   HFpEF - echo pending - GDMT: 10 mg Jardiance , 40 mg valsartan , 25 mg spironolactone    Hypertension - maintained on 2.5 mg amlodipine , 25 mg spironolactone , 40 mg valsartan    Hyperlipidemia with LDL goal < 70  01/23/2024: Cholesterol 159; HDL 47.30; LDL Cholesterol 101; Triglycerides 51.0; VLDL 10.2 - has been on 10 mg crestor , will increase this to 40 mg crestor  - obtain  LPA   OSA  - intolerant to CPAP mask   DM - SSI - resume jardiance  at discharge   Obesity - working on walking program   Risk Assessment/Risk Scores:    TIMI Risk Score for ST  Elevation MI:   The patient's TIMI risk score is 4, which indicates a 7.3% risk of all cause mortality at 30 days.       Code Status: Full Code  Severity of Illness: The appropriate patient status for this patient is INPATIENT. Inpatient status is judged to be reasonable and necessary in order to  provide the required intensity of service to ensure the patient's safety. The patient's presenting symptoms, physical exam findings, and initial radiographic and laboratory data in the context of their chronic comorbidities is felt to place them at high risk for further clinical deterioration. Furthermore, it is not anticipated that the patient will be medically stable for discharge from the hospital within 2 midnights of admission.   * I certify that at the point of admission it is my clinical judgment that the patient will require inpatient hospital care spanning beyond 2 midnights from the point of admission due to high intensity of service, high risk for further deterioration and high frequency of surveillance required.*   For questions or updates, please contact [facility] Please consult [email or web address] for contact info under     Signed, Lamond Pilot, [place]  01/30/2024 3:17 PM  Patient seen and examined and history reviewed. Agree with above findings and plan. I have added to above note and documented my physical exam and recommendations   Christol Thetford [place], MDFACC 01/30/2024 6:03 PM

## 2024-01-30 NOTE — Plan of Care (Signed)
  Problem: Education: Goal: Knowledge of General Education information will improve Description: Including pain rating scale, medication(s)/side effects and non-pharmacologic comfort measures Outcome: Progressing   Problem: Health Behavior/Discharge Planning: Goal: Ability to manage health-related needs will improve Outcome: Progressing   Problem: Clinical Measurements: Goal: Ability to maintain clinical measurements within normal limits will improve Outcome: Progressing Goal: Will remain free from infection Outcome: Progressing Goal: Diagnostic test results will improve Outcome: Progressing Goal: Respiratory complications will improve Outcome: Progressing Goal: Cardiovascular complication will be avoided Outcome: Progressing   Problem: Activity: Goal: Risk for activity intolerance will decrease Outcome: Progressing   Problem: Nutrition: Goal: Adequate nutrition will be maintained Outcome: Progressing   Problem: Coping: Goal: Level of anxiety will decrease Outcome: Progressing   Problem: Elimination: Goal: Will not experience complications related to bowel motility Outcome: Progressing Goal: Will not experience complications related to urinary retention Outcome: Progressing   Problem: Pain Managment: Goal: General experience of comfort will improve and/or be controlled Outcome: Progressing   Problem: Safety: Goal: Ability to remain free from injury will improve Outcome: Progressing   Problem: Skin Integrity: Goal: Risk for impaired skin integrity will decrease Outcome: Progressing   Problem: Education: Goal: Ability to describe self-care measures that may prevent or decrease complications (Diabetes Survival Skills Education) will improve Outcome: Progressing Goal: Individualized Educational Video(s) Outcome: Progressing   Problem: Coping: Goal: Ability to adjust to condition or change in health will improve Outcome: Progressing   Problem: Fluid  Volume: Goal: Ability to maintain a balanced intake and output will improve Outcome: Progressing   Problem: Health Behavior/Discharge Planning: Goal: Ability to identify and utilize available resources and services will improve Outcome: Progressing Goal: Ability to manage health-related needs will improve Outcome: Progressing   Problem: Metabolic: Goal: Ability to maintain appropriate glucose levels will improve Outcome: Progressing   Problem: Nutritional: Goal: Maintenance of adequate nutrition will improve Outcome: Progressing Goal: Progress toward achieving an optimal weight will improve Outcome: Progressing   Problem: Skin Integrity: Goal: Risk for impaired skin integrity will decrease Outcome: Progressing   Problem: Tissue Perfusion: Goal: Adequacy of tissue perfusion will improve Outcome: Progressing   Problem: Education: Goal: Understanding of cardiac disease, CV risk reduction, and recovery process will improve Outcome: Progressing Goal: Individualized Educational Video(s) Outcome: Progressing   Problem: Activity: Goal: Ability to tolerate increased activity will improve Outcome: Progressing   Problem: Cardiac: Goal: Ability to achieve and maintain adequate cardiovascular perfusion will improve Outcome: Progressing   Problem: Health Behavior/Discharge Planning: Goal: Ability to safely manage health-related needs after discharge will improve Outcome: Progressing   Problem: Education: Goal: Understanding of CV disease, CV risk reduction, and recovery process will improve Outcome: Progressing Goal: Individualized Educational Video(s) Outcome: Progressing   Problem: Activity: Goal: Ability to return to baseline activity level will improve Outcome: Progressing   Problem: Cardiovascular: Goal: Ability to achieve and maintain adequate cardiovascular perfusion will improve Outcome: Progressing Goal: Vascular access site(s) Level 0-1 will be  maintained Outcome: Progressing   Problem: Health Behavior/Discharge Planning: Goal: Ability to safely manage health-related needs after discharge will improve Outcome: Progressing  Dorrene Gaucher, RN

## 2024-01-31 ENCOUNTER — Other Ambulatory Visit (HOSPITAL_COMMUNITY): Payer: Self-pay

## 2024-01-31 ENCOUNTER — Inpatient Hospital Stay (HOSPITAL_COMMUNITY)

## 2024-01-31 ENCOUNTER — Telehealth: Payer: Self-pay | Admitting: Cardiology

## 2024-01-31 ENCOUNTER — Encounter (HOSPITAL_COMMUNITY): Payer: Self-pay | Admitting: Cardiology

## 2024-01-31 DIAGNOSIS — I2119 ST elevation (STEMI) myocardial infarction involving other coronary artery of inferior wall: Secondary | ICD-10-CM | POA: Diagnosis not present

## 2024-01-31 DIAGNOSIS — I10 Essential (primary) hypertension: Secondary | ICD-10-CM

## 2024-01-31 LAB — GLUCOSE, CAPILLARY
Glucose-Capillary: 111 mg/dL — ABNORMAL HIGH (ref 70–99)
Glucose-Capillary: 136 mg/dL — ABNORMAL HIGH (ref 70–99)
Glucose-Capillary: 98 mg/dL (ref 70–99)

## 2024-01-31 LAB — BASIC METABOLIC PANEL WITH GFR
Anion gap: 8 (ref 5–15)
BUN: 19 mg/dL (ref 8–23)
CO2: 25 mmol/L (ref 22–32)
Calcium: 8.6 mg/dL — ABNORMAL LOW (ref 8.9–10.3)
Chloride: 107 mmol/L (ref 98–111)
Creatinine, Ser: 0.89 mg/dL (ref 0.44–1.00)
GFR, Estimated: >60 mL/min (ref >60)
Glucose, Bld: 87 mg/dL (ref 70–99)
Potassium: 4.1 mmol/L (ref 3.5–5.1)
Sodium: 140 mmol/L (ref 135–145)

## 2024-01-31 LAB — HEMOGLOBIN A1C
Hgb A1c MFr Bld: 5.1 % (ref 4.8–5.6)
Mean Plasma Glucose: 99.67 mg/dL

## 2024-01-31 LAB — ECHOCARDIOGRAM COMPLETE
AR max vel: 1.98 cm2
AV Peak grad: 9.1 mmHg
Ao pk vel: 1.51 m/s
Area-P 1/2: 3.2 cm2
Height: 59 in
S' Lateral: 2.8 cm
Weight: 3562.63 [oz_av]

## 2024-01-31 LAB — LIPID PANEL
Cholesterol: 122 mg/dL (ref 0–200)
HDL: 42 mg/dL (ref >40)
LDL Cholesterol: 72 mg/dL (ref 0–99)
Total CHOL/HDL Ratio: 2.9 ratio
Triglycerides: 40 mg/dL (ref <150)
VLDL: 8 mg/dL (ref 0–40)

## 2024-01-31 LAB — CBC
HCT: 42.3 % (ref 36.0–46.0)
Hemoglobin: 13.2 g/dL (ref 12.0–15.0)
MCH: 29.1 pg (ref 26.0–34.0)
MCHC: 31.2 g/dL (ref 30.0–36.0)
MCV: 93.2 fL (ref 80.0–100.0)
Platelets: 242 10*3/uL (ref 150–400)
RBC: 4.54 MIL/uL (ref 3.87–5.11)
RDW: 13.8 % (ref 11.5–15.5)
WBC: 11.2 10*3/uL — ABNORMAL HIGH (ref 4.0–10.5)
nRBC: 0 % (ref 0.0–0.2)

## 2024-01-31 MED ORDER — ROSUVASTATIN CALCIUM 20 MG PO TABS
20.0000 mg | ORAL_TABLET | Freq: Every day | ORAL | 3 refills | Status: DC
  Filled 2024-01-31: qty 30, 30d supply, fill #0

## 2024-01-31 MED ORDER — SPIRONOLACTONE 25 MG PO TABS
25.0000 mg | ORAL_TABLET | Freq: Every day | ORAL | Status: DC
  Administered 2024-01-31: 25 mg via ORAL
  Filled 2024-01-31: qty 1

## 2024-01-31 MED ORDER — ASPIRIN 81 MG PO TBEC
81.0000 mg | DELAYED_RELEASE_TABLET | Freq: Every day | ORAL | 3 refills | Status: AC
  Filled 2024-01-31: qty 30, 30d supply, fill #0

## 2024-01-31 MED ORDER — ORAL CARE MOUTH RINSE: 15.0000 mL | OROMUCOSAL | Status: DC | PRN

## 2024-01-31 MED ORDER — IRBESARTAN 75 MG PO TABS
37.5000 mg | ORAL_TABLET | Freq: Every day | ORAL | Status: DC
Start: 2024-01-31 — End: 2024-01-31
  Administered 2024-01-31: 37.5 mg via ORAL
  Filled 2024-01-31: qty 0.5

## 2024-01-31 MED ORDER — CLOPIDOGREL BISULFATE 75 MG PO TABS
75.0000 mg | ORAL_TABLET | Freq: Every day | ORAL | 3 refills | Status: DC
  Filled 2024-01-31: qty 30, 30d supply, fill #0

## 2024-01-31 NOTE — Telephone Encounter (Signed)
 Patient contacted regarding discharge from [facility] on 01/31/2024.  Patient understands to follow up with provider Marlyse Single on 01/31/24 at [facility] at 0825 am Patient understands discharge instructions? Yes Patient understands medications and regiment?Yes Patient understands to bring all medications to this visit? Yes  Ask patient:  Are you enrolled in My Chart Yes

## 2024-01-31 NOTE — Progress Notes (Addendum)
 Rounding Note    Patient Name: Anne Barnes Date of Encounter: 01/31/2024  [facility] Cardiologist: Jann Melody, MD   Subjective   No chest pain.   Inpatient Medications    Scheduled Meds:  aspirin   81 mg Oral Daily   aspirin  EC  81 mg Oral Daily   clopidogrel   75 mg Oral Q breakfast   empagliflozin   10 mg Oral QAC breakfast   enoxaparin  (LOVENOX ) injection  40 mg Subcutaneous Q24H   insulin  aspart  0-15 Units Subcutaneous TID WC   loratadine   10 mg Oral Daily   montelukast   10 mg Oral QHS   multivitamin with minerals  1 tablet Oral Daily   rosuvastatin   20 mg Oral Daily   sodium chloride  flush  3 mL Intravenous Q12H   Continuous Infusions:  sodium chloride      PRN Meds: sodium chloride , acetaminophen , nitroGLYCERIN , mouth rinse, sodium chloride  flush   Vital Signs    Vitals:   01/31/24 0300 01/31/24 0400 01/31/24 0500 01/31/24 0600  BP: (!) 109/58 (!) 115/52 (!) 120/47 (!) 100/46  Pulse: (!) 53 (!) 58 (!) 52 (!) 55  Resp: 13 13 12 12   Temp:  98.7 F (37.1 C)    TempSrc:  Oral    SpO2: 97% 96% 94% 96%  Weight:      Height:        Intake/Output Summary (Last 24 hours) at 01/31/2024 0734 Last data filed at 01/31/2024 0412 Gross per 24 hour  Intake 216.25 ml  Output 1750 ml  Net -1533.75 ml      01/30/2024    5:48 PM 01/23/2024   11:36 AM 10/30/2023   10:35 AM  Last 3 Weights  Weight (lbs) 222 lb 10.6 oz 223 lb 9.6 oz 221 lb  Weight (kg) 101 kg 101.424 kg 100.245 kg      Telemetry    Sinus brady, PVCs- Personally Reviewed  ECG    Sinus, inferior T wave inversions - Personally Reviewed  Physical Exam   GEN: No acute distress.   Neck: No JVD Cardiac: RRR, no murmurs, rubs, or gallops.  Respiratory: Clear to auscultation bilaterally. GI: Soft, nontender, non-distended  MS: No edema; No deformity. Neuro:  Nonfocal  Psych: Normal affect   Labs    High Sensitivity Troponin:  No results for input(s): "TROPONINIHS" in the  last 720 hours.   Chemistry Recent Labs  Lab 01/30/24 1508 01/30/24 1731 01/31/24 0342  NA 140  --  140  K 3.8  --  4.1  CL 104  --  107  CO2  --   --  25  GLUCOSE 131*  --  87  BUN 20  --  19  CREATININE 0.90 0.91 0.89  CALCIUM   --   --  8.6*  GFRNONAA  --  >60 >60  ANIONGAP  --   --  8    Lipids  Recent Labs  Lab 01/31/24 0342  CHOL 122  TRIG 40  HDL 42  LDLCALC 72  CHOLHDL 2.9    Hematology Recent Labs  Lab 01/30/24 1508 01/30/24 1731 01/31/24 0342  WBC  --  11.6* 11.2*  RBC  --  4.70 4.54  HGB 14.3 13.8 13.2  HCT 42.0 43.0 42.3  MCV  --  91.5 93.2  MCH  --  29.4 29.1  MCHC  --  32.1 31.2  RDW  --  13.8 13.8  PLT  --  256 242   Thyroid  No  results for input(s): "TSH", "FREET4" in the last 168 hours.  BNPNo results for input(s): "BNP", "PROBNP" in the last 168 hours.  DDimer No results for input(s): "DDIMER" in the last 168 hours.   Radiology    CARDIAC CATHETERIZATION Result Date: 01/30/2024   Prox RCA lesion is 99% stenosed.   A drug-eluting stent was successfully placed using a STENT SYNERGY XD 3.50X20.   Post intervention, there is a 0% residual stenosis.   LV end diastolic pressure is normal.   Recommend uninterrupted dual antiplatelet therapy with Aspirin  81mg  daily and Clopidogrel  75mg  daily for a minimum of 12 months (ACS-Class I recommendation). 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 Check LV function with Echo. Anticipate fast track discharge in 24 hours if no complications.    Cardiac Studies     Patient Profile     78 y.o. female  with nonobstructive CAD, hypertension, hyperlipidemia, HFpEF, OSA intolerant to CPAP, GERD, and obesity admitted 01/30/24 with an inferolateral STEMI and found to have severe proximal RCA stenosis treated with a drug eluting stent.   Assessment & Plan    CAD/Inferolateral STEMI: Doing well this am. No chest pain.  Severe RCA stenosis treated with a drug eluting stent yesterday afternoon. Will plan to continue DAPT with ASA and Plavix  for one year. Continue statin. Echo today to assess LVEF. No beta blocker with bradycardia.   HFpEF: Echo pending today. Resume valsartan , spriro and Jardiance . LV filling pressure normal by cath  HTN: BP is mildly elevated. Resume home medications.   DM: Continue Jardiance .   Sleep apnea: Intolerant to CPAP  Will d/c home today after echo.   Addendum: Echo with normal LV function and no valve disease.   For questions or updates, please contact [facility] Please consult [email or web address] for contact info under        Signed, Antoinette Batman, MD  01/31/2024, 7:34 AM

## 2024-01-31 NOTE — Progress Notes (Signed)
 Patient and dtr both verbalized understanding of dc instructions. All paperwork, toc meds, and belongings gtven to patient.

## 2024-01-31 NOTE — Progress Notes (Addendum)
 CARDIAC REHAB PHASE I   PRE:  Rate/Rhythm: 58 SB    BP: sitting 110/46    SpO2: 99 RA  MODE:  Ambulation: 270 ft   POST:  Rate/Rhythm: 78 SR    BP: sitting 121/64     SpO2: 97 RA  On my arrival pt had not gotten out of bed yet. Slowly sat on EOB then stood. Initially dizzy therefore stood for a few minutes. Able to ambulate hall slowly with RW. Felt stronger with distance. VSS. She has rollator at home.   Family present. Discussed with pt and daughter MI, stent, restrictions, Plavix  importance, diet, exercise, and CRPII. Pt sts she is not interested in NTG. Will refer to [place] CRPII. She lives with her daughter. [phone]   German Koller BS, ACSM-CEP 01/31/2024 12:00 PM

## 2024-01-31 NOTE — TOC CM/SW Note (Signed)
 Transition of Care [facility]) - Inpatient Brief Assessment   Patient Details  Name: Anne Barnes MRN: [id number] Date of Birth: 02-Oct-1945  Transition of Care [facility]) CM/SW Contact:    Cosimo Diones, RN Phone Number: 01/31/2024, 11:13 AM   Clinical Narrative: Patient presented for chest pain- Stemi. Patient has PCP and has insurance. Patient reports that she has no issues receiving meds and getting to appointments. Daughter is in the room to aide with transportation home. No further home needs identified.    Transition of Care Asessment: Insurance and Status: Insurance coverage has been reviewed Patient has primary care physician: Yes Prior/Current Home Services: No current home services Social Drivers of Health Review: SDOH reviewed no interventions necessary Readmission risk has been reviewed: Yes Transition of care needs: no transition of care needs at this time

## 2024-01-31 NOTE — Discharge Summary (Addendum)
 Discharge Summary    Patient ID: Anne Barnes MRN: [id number]; DOB: 08-13-1946  Admit date: 01/30/2024 Discharge date: 01/31/2024  PCP:  Neda Balk, MD   [facility] Providers Cardiologist:  Jann Melody, MD  Cardiology APP:  Gabino Joe, PA-C       Discharge Diagnoses    Principal Problem:   STEMI involving right coronary artery [facility]) Active Problems:   Acute ST elevation myocardial infarction (STEMI) of inferior wall [facility])    Diagnostic Studies/Procedures    01/30/24 LHC with PCI    Prox RCA lesion is 99% stenosed.   A drug-eluting stent was successfully placed using a STENT SYNERGY XD 3.50X20.   Post intervention, there is a 0% residual stenosis.   LV end diastolic pressure is normal.   Recommend uninterrupted dual antiplatelet therapy with Aspirin  81mg  daily and Clopidogrel  75mg  daily for a minimum of 12 months (ACS-Class I recommendation).   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   Diagnostic Dominance: Right  Intervention   _____________   History of Present Illness     Anne Barnes is a 78 y.o. female with CAD, hypertension, hyperlipidemia, HFpEF, OSA intolerant to CPAP, GERD, and obesity who was admitted for the evaluation/management of STEMI. Patient presented to [facility] ED with chest pain that began around 1130am. This followed symptoms the previous night that were initially felt to be indigestion by patient. EMS activated 5/8 and patient found with ST elevation in inferior and lateral leads, prompting code STEMI activation.  Hospital Course     Consultants: n/a   Inferior STEMI RCA stenosis Patient presented with EMS as a code STEMI with inferior and lateral lead ST elevation after onset of chest pain at home. She was given 324mg  ASA and heparin  and brought emergently to the cath lab for revascularization. LHC as  detailed in procedure summary above. Patient received DES x1 to mid RCA.  Continue DAPT with ASA 81mg  and Plavix  75mg  daily x1 year. No beta blocker with baseline bradycardia  HFpEF Previous echocardiogram in February of 2024 with preserved LVEF, grade II DD. LHC this admission showed normal LVEDP. TTE this admission with  Lasix  20mg  3 times weekly Spironolactone  67m daily Jardiance  10mg  daily  Hypertension Patient with mild hypertension this admission. Resume home meds as below. Amlodipine  2.5mg  daily Valsartan  40mg  daily Spironolactone  25mg  daily  Hyperlipidemia LDL this admission just above goal 70mg /dL. Increase Crestor  to 20mg  from 10mg . Lab Results  Component Value Date   CHOL 122 01/31/2024   HDL 42 01/31/2024   LDLCALC 72 01/31/2024   TRIG 40 01/31/2024   CHOLHDL 2.9 01/31/2024   Diabetes Mellitus Continue home Jardiance  10mg  daily.      Did the patient have an acute coronary syndrome (MI, NSTEMI, STEMI, etc) this admission?:  Yes                               AHA/ACC ACS Clinical Performance & Quality Measures: Aspirin  prescribed? - Yes ADP Receptor Inhibitor (Plavix /Clopidogrel , Brilinta/Ticagrelor or Effient/Prasugrel) prescribed (includes medically managed patients)? - Yes Beta Blocker prescribed? - No - bradycardia High Intensity Statin (Lipitor 40-80mg  or Crestor  20-40mg ) prescribed? - Yes EF assessed during THIS hospitalization? - Yes For EF <40%, was ACEI/ARB prescribed? - Not Applicable (EF >/= 40%) For EF <  40%, Aldosterone Antagonist (Spironolactone  or Eplerenone) prescribed? - Not Applicable (EF >/= 40%) Cardiac Rehab Phase II ordered (including medically managed patients)? - Yes       The patient will be scheduled for a TOC follow up appointment in 14 days.  A message has been sent to the [facility] and Scheduling Pool at the office where the patient should be seen for follow up.  _____________  Discharge Vitals Blood pressure (!) 137/50, pulse (!)  51, temperature 98.5 F (36.9 C), temperature source Oral, resp. rate 16, height 4\' 11"  (1.499 m), weight 101 kg, SpO2 99%.  Filed Weights   01/30/24 1748  Weight: 101 kg    Labs & Radiologic Studies    CBC Recent Labs    01/30/24 1731 01/31/24 0342  WBC 11.6* 11.2*  HGB 13.8 13.2  HCT 43.0 42.3  MCV 91.5 93.2  PLT 256 242   Basic Metabolic Panel Recent Labs    84/13/24 1508 01/30/24 1731 01/31/24 0342  NA 140  --  140  K 3.8  --  4.1  CL 104  --  107  CO2  --   --  25  GLUCOSE 131*  --  87  BUN 20  --  19  CREATININE 0.90 0.91 0.89  CALCIUM   --   --  8.6*   Liver Function Tests No results for input(s): "AST", "ALT", "ALKPHOS", "BILITOT", "PROT", "ALBUMIN" in the last 72 hours. No results for input(s): "LIPASE", "AMYLASE" in the last 72 hours. High Sensitivity Troponin:   No results for input(s): "TROPONINIHS" in the last 720 hours.  BNP Invalid input(s): "POCBNP" D-Dimer No results for input(s): "DDIMER" in the last 72 hours. Hemoglobin A1C Recent Labs    01/31/24 0342  HGBA1C 5.1   Fasting Lipid Panel Recent Labs    01/31/24 0342  CHOL 122  HDL 42  LDLCALC 72  TRIG 40  CHOLHDL 2.9   Thyroid  Function Tests No results for input(s): "TSH", "T4TOTAL", "T3FREE", "THYROIDAB" in the last 72 hours.  Invalid input(s): "FREET3" _____________  ECHOCARDIOGRAM COMPLETE Result Date: 01/31/2024    ECHOCARDIOGRAM REPORT   Patient Name:   Anne Barnes Date of Exam: 01/31/2024 Medical Rec #:  [id number]      Height:       59.0 in Accession #:    [id number]     Weight:       222.7 lb Date of Birth:  01-Feb-1946     BSA:          1.930 m Patient Age:    77 years       BP:           109/48 mmHg Patient Gender: F              HR:           45 bpm. Exam Location:  Inpatient Procedure: 2D Echo, Cardiac Doppler and Color Doppler (Both Spectral and Color            Flow Doppler were utilized during procedure). Indications:    Acute myocardial infarction, unspecified I21.9   History:        Patient has prior history of Echocardiogram examinations, most                 recent 11/05/2022. Acute MI and CAD, Arrythmias:Bradycardia,                 Signs/Symptoms:Chest Pain and Dyspnea; Risk Factors:Hypertension  and Sleep Apnea.  Sonographer:    Terrilee Few RCS Referring Phys: [phone] PETER M [place] IMPRESSIONS  1. Left ventricular ejection fraction, by estimation, is 60 to 65%. The left ventricle has normal function. The left ventricle has no regional wall motion abnormalities. There is mild left ventricular hypertrophy. Left ventricular diastolic parameters are indeterminate.  2. Right ventricular systolic function is normal. The right ventricular size is normal.  3. The mitral valve is abnormal. Trivial mitral valve regurgitation. No evidence of mitral stenosis.  4. The aortic valve is tricuspid. There is mild calcification of the aortic valve. There is mild thickening of the aortic valve. Aortic valve regurgitation is not visualized. Aortic valve sclerosis is present, with no evidence of aortic valve stenosis.  5. The inferior vena cava is normal in size with greater than 50% respiratory variability, suggesting right atrial pressure of 3 mmHg. FINDINGS  Left Ventricle: Left ventricular ejection fraction, by estimation, is 60 to 65%. The left ventricle has normal function. The left ventricle has no regional wall motion abnormalities. Strain was performed and the global longitudinal strain is indeterminate. The left ventricular internal cavity size was normal in size. There is mild left ventricular hypertrophy. Left ventricular diastolic parameters are indeterminate. Right Ventricle: The right ventricular size is normal. No increase in right ventricular wall thickness. Right ventricular systolic function is normal. Left Atrium: Left atrial size was normal in size. Right Atrium: Right atrial size was normal in size. Pericardium: There is no evidence of pericardial effusion.  Mitral Valve: The mitral valve is abnormal. There is mild thickening of the mitral valve leaflet(s). Trivial mitral valve regurgitation. No evidence of mitral valve stenosis. Tricuspid Valve: The tricuspid valve is normal in structure. Tricuspid valve regurgitation is not demonstrated. No evidence of tricuspid stenosis. Aortic Valve: The aortic valve is tricuspid. There is mild calcification of the aortic valve. There is mild thickening of the aortic valve. Aortic valve regurgitation is not visualized. Aortic valve sclerosis is present, with no evidence of aortic valve stenosis. Aortic valve peak gradient measures 9.1 mmHg. Pulmonic Valve: The pulmonic valve was normal in structure. Pulmonic valve regurgitation is mild. No evidence of pulmonic stenosis. Aorta: The aortic root is normal in size and structure. Venous: The inferior vena cava is normal in size with greater than 50% respiratory variability, suggesting right atrial pressure of 3 mmHg. IAS/Shunts: No atrial level shunt detected by color flow Doppler. Additional Comments: 3D was performed not requiring image post processing on an independent workstation and was indeterminate.  LEFT VENTRICLE PLAX 2D LVIDd:         4.30 cm   Diastology LVIDs:         2.80 cm   LV e' medial:    5.27 cm/s LV PW:         1.30 cm   LV E/e' medial:  16.0 LV IVS:        1.30 cm   LV e' lateral:   7.30 cm/s LVOT diam:     2.00 cm   LV E/e' lateral: 11.6 LV SV:         72 LV SV Index:   37 LVOT Area:     3.14 cm  RIGHT VENTRICLE             IVC RV S prime:     13.20 cm/s  IVC diam: 2.00 cm TAPSE (M-mode): 1.8 cm LEFT ATRIUM             Index  RIGHT ATRIUM           Index LA diam:        3.70 cm 1.92 cm/m   RA Area:     15.40 cm LA Vol (A2C):   50.3 ml 26.06 ml/m  RA Volume:   36.60 ml  18.96 ml/m LA Vol (A4C):   55.7 ml 28.85 ml/m LA Biplane Vol: 53.7 ml 27.82 ml/m  AORTIC VALVE                 PULMONIC VALVE AV Area (Vmax): 1.98 cm     PR End Diast Vel: 2.33 msec AV  Vmax:        151.00 cm/s AV Peak Grad:   9.1 mmHg LVOT Vmax:      95.07 cm/s LVOT Vmean:     60.933 cm/s LVOT VTI:       0.229 m  AORTA Ao Root diam: 3.30 cm Ao Asc diam:  3.00 cm MITRAL VALVE MV Area (PHT): 3.20 cm    SHUNTS MV Decel Time: 237 msec    Systemic VTI:  0.23 m MV E velocity: 84.40 cm/s  Systemic Diam: 2.00 cm MV A velocity: 90.30 cm/s MV E/A ratio:  0.93 Janelle Mediate MD Electronically signed by Janelle Mediate MD Signature Date/Time: 01/31/2024/10:33:39 AM    Final    CARDIAC CATHETERIZATION Result Date: 01/30/2024   Prox RCA lesion is 99% stenosed.   A drug-eluting stent was successfully placed using a STENT SYNERGY XD 3.50X20.   Post intervention, there is a 0% residual stenosis.   LV end diastolic pressure is normal.   Recommend uninterrupted dual antiplatelet therapy with Aspirin  81mg  daily and Clopidogrel  75mg  daily for a minimum of 12 months (ACS-Class I recommendation). 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 Check LV function with Echo. Anticipate fast track discharge in 24 hours if no complications.   Disposition   Pt is being discharged home today in good condition.  Follow-up Plans & Appointments     Discharge Instructions     AMB Referral to Cardiac Rehabilitation - Phase II   Complete by: As directed    Diagnosis:  Coronary Stents STEMI     After initial evaluation and assessments completed: Virtual Based Care may be provided alone or in conjunction with Phase 2 Cardiac Rehab based on patient barriers.: Yes   Intensive Cardiac Rehabilitation (ICR) [facility] location only OR Traditional Cardiac Rehabilitation (TCR) *If criteria for ICR are not met will enroll in TCR [facility] only): Yes        Discharge Medications   Allergies as of 01/31/2024       Reactions   Codeine Nausea And Vomiting   Hydralazine  Hcl Other (See Comments)   Pt reports causes dizziness    Lisinopril-hydrochlorothiazide Hives   REACTION: lip swelling,facial swelling,rash.   Metoprolol  Other (See Comments)   Does not feel well on it    Other Cough   Patient states she is allergic to Shrimp, egg whites, tomatoes and dairy products.  Says they make her feel unwell but do not cause hives, SOB or anaphylaxis.  She just avoids eating them because they can make her have a headache.  Strawberries, watermelon, peanuts, and all shellfish.   environmental   Shrimp [shellfish Allergy ] Other (See Comments)   Was allergy  tested and showed shrimp was one--has not reacted b/c avoided  Medication List     TAKE these medications    ADVIL COLD/SINUS PO Take 2 tablets by mouth as needed.   albuterol  108 (90 Base) MCG/ACT inhaler Commonly known as: VENTOLIN  HFA INHALE 2 PUFFS BY MOUTH EVERY 6 HOURS AS NEEDED FOR WHEEZING FOR SHORTNESS OF BREATH What changed:  how much to take how to take this when to take this reasons to take this   alendronate  70 MG tablet Commonly known as: Fosamax  Take 1 tablet (70 mg total) by mouth once a week. Take with a full glass of water on an empty stomach. What changed: additional instructions   ALPRAZolam  0.5 MG tablet Commonly known as: XANAX  TAKE 1 TABLET BY MOUTH TWICE DAILY AS NEEDED FOR ANXIETY AND FOR SLEEP What changed: See the new instructions.   amLODipine  2.5 MG tablet Commonly known as: NORVASC  Take 1 tablet (2.5 mg total) by mouth daily.   aspirin  EC 81 MG tablet Take 1 tablet (81 mg total) by mouth daily. Swallow whole.   azelastine  0.05 % ophthalmic solution Commonly known as: OPTIVAR  Place 1 drop into both eyes 2 (two) times daily as needed.   azelastine  0.1 % nasal spray Commonly known as: ASTELIN  Place 2 sprays into both nostrils 2 (two) times daily as needed for rhinitis.   b complex vitamins tablet Take 1 tablet by mouth daily.   clopidogrel  75 MG tablet Commonly known as: PLAVIX  Take 1 tablet (75 mg total)  by mouth daily with breakfast. Start taking on: Feb 01, 2024   empagliflozin  10 MG Tabs tablet Commonly known as: JARDIANCE  Take 1 tablet (10 mg total) by mouth daily before breakfast.   EPINEPHrine  0.3 mg/0.3 mL Soaj injection Commonly known as: EPI-PEN Inject 0.3 mg into the muscle as needed for anaphylaxis.   XHANCE  NA Place 2 sprays into the nose 2 (two) times daily as needed.   fluticasone  50 MCG/ACT nasal spray Commonly known as: FLONASE  Use 1-2 sprays in each nostril once  a day as needed for stuffy nose. In the right nostril, point the applicator out toward the right ear. In the left nostril, point the applicator out toward the left ear   furosemide  20 MG tablet Commonly known as: LASIX  Take 1 tablet (20 mg total) by mouth 3 (three) times a week. Monday, Wednesday and Friday.   levocetirizine 5 MG tablet Commonly known as: XYZAL  TAKE 1 TABLET BY MOUTH ONCE DAILY IN THE EVENING   MAGNESIUM  CITRATE PO Take 1 tablet by mouth daily.   montelukast  10 MG tablet Commonly known as: SINGULAIR  TAKE 1 TABLET BY MOUTH AT BEDTIME   multivitamin tablet Take 1 tablet by mouth daily.   nitroGLYCERIN  0.4 MG SL tablet Commonly known as: Nitrostat  Place 1 tablet (0.4 mg total) under the tongue every 5 (five) minutes as needed for chest pain.   rosuvastatin  20 MG tablet Commonly known as: CRESTOR  Take 1 tablet (20 mg total) by mouth daily. What changed:  medication strength how much to take   spironolactone  25 MG tablet Commonly known as: ALDACTONE  Take 1 tablet (25 mg total) by mouth daily.   UNABLE TO FIND Take 2 capsules by mouth daily. Fish Oil w/ tumeric   valsartan  40 MG tablet Commonly known as: DIOVAN  Take 1 tablet (40 mg total) by mouth daily.   Vitamin D  50 MCG (2000 UT) tablet Take 2,000 Units by mouth daily.           Outstanding Labs/Studies    Duration of Discharge Encounter:  APP Time: 20 minutes   Signed, Leala Prince, PA-C 01/31/2024, 10:41  AM   I have personally seen and examined this patient. I agree with the assessment and plan as outlined above.   25 minutes spent by me on note review, echo image review, cath film review, review of labs and vitals, telemetry and EKG, examination and plan formulation with note preparation.   78 yo female admitted with inferior STEMI I personally reviewed the cath films Severe RCA stenosis treated with a DES LAD and Circ ok Normal LV function by echo today. I personally reviewed the echo films No chest pain.  Vitals and labs reviewed by me.  Exam: NAD  RRR Clear lungs Ext no edema. Cath site ok right wrist Plan: d/c home today with plans for DAPT with ASA and Plavix  for 12 months. Continue statin.   I am attaching my plan from my rounding note today which I created independently.    CAD/Inferolateral STEMI: Doing well this am. No chest pain. Severe RCA stenosis treated with a drug eluting stent yesterday afternoon. Will plan to continue DAPT with ASA and Plavix  for one year. Continue statin. Echo today to assess LVEF. No beta blocker with bradycardia.    HFpEF: Echo pending today. Resume valsartan , spriro and Jardiance . LV filling pressure normal by cath   HTN: BP is mildly elevated. Resume home medications.    DM: Continue Jardiance .    Sleep apnea: Intolerant to CPAP  Antoinette Batman, MD, [facility] 01/31/2024 10:46 AM

## 2024-01-31 NOTE — Telephone Encounter (Signed)
   Transition of Care Follow-up Phone Call Request    Patient Name: Anne Barnes Date of Birth: 1946-08-06 Date of Encounter: 01/31/2024  Primary Care Provider:  Neda Balk, MD Primary Cardiologist:  Jann Melody, MD  Lamond Pilot has been scheduled for a transition of care follow up appointment with a [facility] provider:  5/23 at 8:25am with Marlyse Single PA-C  Please reach out to Lamond Pilot within 48 hours of discharge to confirm appointment and review transition of care protocol questionnaire. Anticipated discharge date: 5/9  Leala Prince, PA-C  01/31/2024, 10:06 AM

## 2024-01-31 NOTE — Progress Notes (Signed)
 Echocardiogram 2D Echocardiogram has been performed.  Anne Barnes 01/31/2024, 10:20 AM

## 2024-02-01 LAB — LIPOPROTEIN A (LPA): Lipoprotein (a): 99 nmol/L — ABNORMAL HIGH (ref <75.0)

## 2024-02-03 ENCOUNTER — Telehealth: Payer: Self-pay | Admitting: *Deleted

## 2024-02-03 NOTE — Transitions of Care (Post Inpatient/ED Visit) (Signed)
   02/03/2024  Name: Anne Barnes MRN: [id number] DOB: 1946-01-04  Today's TOC FU Call Status: Today's TOC FU Call Status:: Unsuccessful Call (1st Attempt) Unsuccessful Call (1st Attempt) Date: 02/03/24  Attempted to reach the patient regarding the most recent Inpatient/ED visit.  Follow Up Plan: Additional outreach attempts will be made to reach the patient to complete the Transitions of Care (Post Inpatient/ED visit) call.   [name] BSN RN [facility] Health Care Management Coordinator [name] Direct Dial: [phone]  Fax: [phone] Website: [email or web address]

## 2024-02-04 ENCOUNTER — Telehealth: Payer: Self-pay | Admitting: *Deleted

## 2024-02-04 NOTE — Transitions of Care (Post Inpatient/ED Visit) (Signed)
   02/04/2024  Name: Anne Barnes MRN: [id number] DOB: 02/28/1946  Today's TOC FU Call Status: Today's TOC FU Call Status:: Unsuccessful Call (2nd Attempt) Unsuccessful Call (2nd Attempt) Date: 02/04/24  Attempted to reach the patient regarding the most recent Inpatient/ED visit.  Follow Up Plan: Additional outreach attempts will be made to reach the patient to complete the Transitions of Care (Post Inpatient/ED visit) call.   [name] BSN RN  [facility] Health Care Management Coordinator [name] Direct Dial: [phone]  Fax: [phone] Website: [email or web address]

## 2024-02-05 ENCOUNTER — Telehealth: Payer: Self-pay | Admitting: *Deleted

## 2024-02-05 NOTE — Transitions of Care (Post Inpatient/ED Visit) (Signed)
 02/05/2024  Name: Anne Barnes MRN: [id number] DOB: Jan 24, 1946  Today's TOC FU Call Status: Today's TOC FU Call Status:: Successful TOC FU Call Completed TOC FU Call Complete Date: 02/05/24 Patient's Name and Date of Birth confirmed.  Transition Care Management Follow-up Telephone Call Date of Discharge: 01/31/24 Discharge Facility: [facility]) Type of Discharge: Inpatient Admission Primary Inpatient Discharge Diagnosis:: STEMI involving right coronary artery How have you been since you were released from the hospital?: Better Any questions or concerns?: No  Items Reviewed: Did you receive and understand the discharge instructions provided?: Yes Medications obtained,verified, and reconciled?: Yes (Medications Reviewed) Any new allergies since your discharge?: No Dietary orders reviewed?: Yes Type of Diet Ordered:: heart healthy Do you have support at home?: Yes People in Home [RPT]: child(ren), adult, grandchild(ren) Name of Support/Comfort Primary Source: Daughter/Paulette and 2 grandsons  Medications Reviewed Today: Medications Reviewed Today     Reviewed by Aura Leeds, RN (Registered Nurse) on 02/05/24 at 1506  Med List Status: <None>   Medication Order Taking? Sig Documenting Provider Last Dose Status Informant  albuterol  (VENTOLIN  HFA) 108 (90 Base) MCG/ACT inhaler [id number] Yes INHALE 2 PUFFS BY MOUTH EVERY 6 HOURS AS NEEDED FOR WHEEZING FOR SHORTNESS OF BREATH  Patient taking differently: Inhale 2 puffs into the lungs every 6 (six) hours as needed for wheezing or shortness of breath. INHALE 2 PUFFS BY MOUTH EVERY 6 HOURS AS NEEDED FOR WHEEZING FOR SHORTNESS OF Arnoldo Lapping, MD Taking Active Self, Pharmacy Records  alendronate  (FOSAMAX ) 70 MG tablet [id number] Yes Take 1 tablet (70 mg total) by mouth once a week. Take with a full glass of water on an empty stomach.  Patient taking differently: Take 70 mg by mouth once a week. Take with a full glass  of water on an empty stomach. On Saturdays   Neda Balk, MD Taking Active Self, Pharmacy Records  ALPRAZolam  (XANAX ) 0.5 MG tablet [id number] Yes TAKE 1 TABLET BY MOUTH TWICE DAILY AS NEEDED FOR ANXIETY AND FOR SLEEP  Patient taking differently: Take 0.25 mg by mouth 2 (two) times daily as needed for anxiety or sleep.   Neda Balk, MD Taking Active Self, Pharmacy Records  amLODipine  (NORVASC ) 2.5 MG tablet [id number] Yes Take 1 tablet (2.5 mg total) by mouth daily. Neda Balk, MD Taking Active Self, Pharmacy Records  aspirin  EC 81 MG tablet [id number] Yes Take 1 tablet (81 mg total) by mouth daily. Swallow whole. Leala Prince, PA-C Taking Active   azelastine  (ASTELIN ) 0.1 % nasal spray [id number] No Place 2 sprays into both nostrils 2 (two) times daily as needed for rhinitis.  Patient not taking: Reported on 02/05/2024   Brian Campanile, MD Not Taking Active Self, Pharmacy Records  azelastine  (OPTIVAR ) 0.05 % ophthalmic solution [id number] Yes Place 1 drop into both eyes 2 (two) times daily as needed. Brian Campanile, MD Taking Active Self, Pharmacy Records  b complex vitamins tablet [id number] Yes Take 1 tablet by mouth daily. [provider] Taking Active Self, Pharmacy Records  Cholecalciferol (VITAMIN D ) 50 MCG (2000 UT) tablet [id number] Yes Take 2,000 Units by mouth daily. [provider] Taking Active Self, Pharmacy Records  clopidogrel  (PLAVIX ) 75 MG tablet [id number] Yes Take 1 tablet (75 mg total) by mouth daily with breakfast. Leala Prince, PA-C Taking Active   empagliflozin  (JARDIANCE ) 10 MG TABS tablet [id number] Yes Take 1 tablet (10 mg total) by mouth daily before breakfast. Marlyse Single T, PA-C  Taking Active Self, Pharmacy Records  EPINEPHrine  0.3 mg/0.3 mL IJ SOAJ injection [id number] No Inject 0.3 mg into the muscle as needed for anaphylaxis.  Patient not taking: Reported on 02/05/2024   Brian Campanile, MD Not Taking Active  Self, Pharmacy Records           Med Note (Nishtha Raider A   Wed Feb 05, 2024  3:02 PM) Has on hand  fluticasone  (FLONASE ) 50 MCG/ACT nasal spray [id number] No Use 1-2 sprays in each nostril once  a day as needed for stuffy nose. In the right nostril, point the applicator out toward the right ear. In the left nostril, point the applicator out toward the left ear  Patient not taking: Reported on 02/05/2024   Brian Campanile, MD Not Taking Active Self, Pharmacy Records  Fluticasone  Propionate (XHANCE  NA) [id number] Yes Place 2 sprays into the nose 2 (two) times daily as needed. [provider] Taking Active Self, Pharmacy Records  furosemide  (LASIX ) 20 MG tablet [id number] No Take 1 tablet (20 mg total) by mouth 3 (three) times a week. Monday, Wednesday and Friday.  Patient not taking: Reported on 02/05/2024   Sherwood Donath Not Taking Active Self, Pharmacy Records           Med Note Neal Baldy, Anyelin Mogle A   Wed Feb 05, 2024  3:04 PM) Needs refill  levocetirizine (XYZAL ) 5 MG tablet [id number] Yes TAKE 1 TABLET BY MOUTH ONCE DAILY IN THE EVENING Padgett, Rhoderick Ceo, MD Taking Active Self, Pharmacy Records  MAGNESIUM  CITRATE PO [id number] Yes Take 1 tablet by mouth daily. [provider] Taking Active Self, Pharmacy Records  montelukast  (SINGULAIR ) 10 MG tablet [id number] Yes TAKE 1 TABLET BY MOUTH AT BEDTIME Brian Campanile, MD Taking Active Self, Pharmacy Records  Multiple Vitamin (MULTIVITAMIN) tablet [id number] Yes Take 1 tablet by mouth daily. [provider] Taking Active Self, Pharmacy Records  nitroGLYCERIN  (NITROSTAT ) 0.4 MG SL tablet [id number] Yes Place 1 tablet (0.4 mg total) under the tongue every 5 (five) minutes as needed for chest pain. Gabino Joe, PA-C Taking Active Self, Pharmacy Records  Pseudoephedrine-Ibuprofen (ADVIL COLD/SINUS PO) [id number] Yes Take 2 tablets by mouth as needed. [provider] Taking Active Self, Pharmacy  Records  rosuvastatin  (CRESTOR ) 20 MG tablet [id number] Yes Take 1 tablet (20 mg total) by mouth daily. Leala Prince, PA-C Taking Active   spironolactone  (ALDACTONE ) 25 MG tablet [id number] Yes Take 1 tablet (25 mg total) by mouth daily. Gabino Joe, PA-C Taking Active Self, Pharmacy Records  UNABLE TO FIND [id number] Yes Take 2 capsules by mouth daily. Fish Oil w/ tumeric [provider] Taking Active Self, Pharmacy Records           Med Note Silvestre Drum, Myrtle Atta   Thu Jan 30, 2024  8:40 PM) Patient confirmed she sometimes forgets to take them.  valsartan  (DIOVAN ) 40 MG tablet [id number] Yes Take 1 tablet (40 mg total) by mouth daily. Neda Balk, MD Taking Active Self, Pharmacy Records            Home Care and Equipment/Supplies: Were Home Health Services Ordered?: No Any new equipment or medical supplies ordered?: No  Functional Questionnaire: Do you need assistance with bathing/showering or dressing?: No Do you need assistance with meal preparation?: Yes Do you need assistance with eating?: No Do you have difficulty maintaining continence: No Do you need assistance with getting out of bed/getting out of a chair/moving?: No Do you have difficulty managing  or taking your medications?: No  Follow up appointments reviewed: PCP Follow-up appointment confirmed?: Yes Date of PCP follow-up appointment?: 02/12/24 Follow-up Provider: Dr. Rodrick Clapper Specialist [facility] Follow-up appointment confirmed?: Yes Date of Specialist follow-up appointment?: 02/14/24 Follow-Up Specialty Provider:: Cardiology with Marlyse Single Do you need transportation to your follow-up appointment?: No Do you understand care options if your condition(s) worsen?: Yes-patient verbalized understanding  SDOH Interventions Today    Flowsheet Row Most Recent Value  SDOH Interventions   Food Insecurity Interventions Intervention Not Indicated  Housing Interventions Intervention Not Indicated   Transportation Interventions Intervention Not Indicated  Utilities Interventions Intervention Not Indicated       Arna Better RN, BSN [place]  [facility] RN Care Manager [phone]

## 2024-02-12 ENCOUNTER — Inpatient Hospital Stay: Admitting: Student

## 2024-02-12 ENCOUNTER — Other Ambulatory Visit: Payer: Self-pay

## 2024-02-12 ENCOUNTER — Encounter: Payer: Self-pay | Admitting: Student

## 2024-02-12 ENCOUNTER — Ambulatory Visit: Admitting: Student

## 2024-02-12 VITALS — BP 142/70 | HR 90 | Ht 59.0 in | Wt 224.0 lb

## 2024-02-12 DIAGNOSIS — R42 Dizziness and giddiness: Secondary | ICD-10-CM

## 2024-02-12 DIAGNOSIS — K219 Gastro-esophageal reflux disease without esophagitis: Secondary | ICD-10-CM

## 2024-02-12 DIAGNOSIS — I2119 ST elevation (STEMI) myocardial infarction involving other coronary artery of inferior wall: Secondary | ICD-10-CM | POA: Diagnosis not present

## 2024-02-12 DIAGNOSIS — Z09 Encounter for follow-up examination after completed treatment for conditions other than malignant neoplasm: Secondary | ICD-10-CM | POA: Insufficient documentation

## 2024-02-12 MED ORDER — EMPAGLIFLOZIN 10 MG PO TABS: 10.0000 mg | ORAL_TABLET | Freq: Every day | ORAL | 3 refills | Status: AC

## 2024-02-12 MED ORDER — PANTOPRAZOLE SODIUM 40 MG PO TBEC: 40.0000 mg | DELAYED_RELEASE_TABLET | Freq: Every day | ORAL | 3 refills | Status: DC

## 2024-02-12 NOTE — Progress Notes (Addendum)
 Subjective:     Patient ID: Anne Barnes, female    DOB: 01-11-1946, 78 y.o.   MRN: [id number]  Chief Complaint  Patient presents with   Hospitalization Follow-up    HPI   History of Present Illness  Anne Barnes a 78 y.o. female presents for FU hospital visit.     Admitted for Inferior STEMI, received DES x1 to RCA.   Cardiologist Outpatient: follow up Friday  Continue DAPT with ASA 81mg  and Plavix  75mg  daily x1 year.  No beta blocker with baseline bradycardia   HFpEF- echocardiogram in February of 2024 with preserved LVEF. Lasix  20mg  3 times weekly Spironolactone  40m daily Jardiance  10mg  daily   Hypertension Amlodipine  2.5mg  daily Valsartan  40mg  daily Spironolactone  25mg  daily    BP Readings from Last 3 Encounters:  02/12/24 (!) 142/70  02/05/24 132/68  01/31/24 (!) 110/46      Hyperlipidemia goal 70mg /dL. Crestor  to 20mg  from 10mg . Reports no adverse SE. Compliant with medication.   Patient reports increased acid reflux symptoms since returning home from the hospital. +dry cough.  Currently experiencing episodes >3 days per week. Pt denies abdominal pain, bloating, N/V, Loss of appetite, fever.   She c/o experiences episodes of dizziness and lightheadedness, she reports this is often associated with sinus pressure and coughing. Pt states episodes occurs when allergies are worse.  Patient denies fever, chills, CP, SOB, palpitations,edema, HA, vision changes, N/V/D, abdominal pain, urinary symptoms, rash, weight changes,     Health Maintenance Due  Topic Date Due   MAMMOGRAM  11/21/2016   Medicare Annual Wellness (AWV)  06/05/2023    Past Medical History:  Diagnosis Date   Acute combined systolic and diastolic heart failure (HCC) 05/26/2015   ALLERGIC RHINITIS    Allergic urticaria 08/01/2014   Patient reports allergies to shrimp, egg whites, tomatoes, dairy    Allergy     seasonal and numerous food and drug allergies.   Asthma, mild  persistent 11/23/2015   Office Spirometry 11/07/17-WNL-FVC 1.58/82%, FEV1 1.27/85%, ratio 0.80, FEF 25-75% 0.26/89%   Bradycardia 02/25/2017   CAD (coronary artery disease) 05/01/2023   Myoview  03/26/2017: No ischemia Non-obs dz - CCTA 10/31/2019: CAC score 127; RCA proximal and mid 25-49, LAD proximal and mid 0-25    Chiari malformation    Noted MRI brain 09/2013 - s/p neuro eval for same   DIVERTICULOSIS, COLON    Essential hypertension 08/28/2010   Qualifier: Diagnosis of  By: Georganne Kind RMA, Lucy      Frequent headaches    GERD    H/O measles    H/O mumps    Hearing loss 01/18/2017   History of chicken pox    Hyperglycemia 03/05/2016   HYPERTENSION    Hypocalcemia 02/25/2017   IBS (irritable bowel syndrome) 05/26/2015   Nonspecific abnormal electrocardiogram (ECG) (EKG)    OBSTRUCTIVE SLEEP APNEA 12/2008 dx   noncompliant with CPAP qhs   Obstructive sleep apnea 08/28/2010   NPSG 12/2008:  AHI 13/hr with desats to 78%    Preventative health care 06/11/2017   Sciatica    right side   Seasonal and perennial allergic rhinitis 08/28/2010   Allergy  profile 03/11/14- positive especially for dust, cat and dog Food allergy  profile- total IgE 86 with several food group elevations Sed rate-WNL      Sinusitis 06/06/2017   Sleep apnea    uses cpap   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   Vaginitis 01/18/2017   Vitamin D  deficiency 06/11/2017    Past  Surgical History:  Procedure Laterality Date   ABDOMINAL HYSTERECTOMY  1986   BIOPSY  07/19/2022   Procedure: BIOPSY;  Surgeon: Annis Kinder, DO;  Location: [facility];  Service: Gastroenterology;;   BREAST SURGERY  1973   Breast biopsy   COLONOSCOPY  2011   Dr Monnie Anthony ACUTE MI REVASCULARIZATION N/A 01/30/2024   Procedure: Coronary/Graft Acute MI Revascularization;  Surgeon: Swaziland, Peter M, MD;  Location: [facility] INVASIVE CV LAB;  Service: Cardiovascular;  Laterality: N/A;   ESOPHAGOGASTRODUODENOSCOPY (EGD) WITH PROPOFOL  N/A  07/19/2022   Procedure: ESOPHAGOGASTRODUODENOSCOPY (EGD) WITH PROPOFOL ;  Surgeon: Annis Kinder, DO;  Location: [facility];  Service: Gastroenterology;  Laterality: N/A;   LEFT HEART CATH AND CORONARY ANGIOGRAPHY N/A 01/30/2024   Procedure: LEFT HEART CATH AND CORONARY ANGIOGRAPHY;  Surgeon: Swaziland, Peter M, MD;  Location: [facility] INVASIVE CV LAB;  Service: Cardiovascular;  Laterality: N/A;   UMBILICAL HERNIA REPAIR      Family History  Problem Relation Age of Onset   Arthritis Mother        died of complications from hip surgery   Arthritis Father    Kidney disease Father    Pneumonia Father    Heart disease Father    Kidney disease Sister        dialysis   Obesity Sister    Hypertension Sister    Kidney disease Sister        dialysis   Diabetes Brother    Heart disease Brother    Heart attack Brother    Alcohol abuse Brother    Throat cancer Brother    Lung cancer Brother    Pneumonia Brother    Lupus Daughter    Rheum arthritis Daughter    Sjogren's syndrome Daughter    Fibromyalgia Daughter    Hypertension Daughter    ALS Daughter    Murrell Arrant' disease Daughter    Hypertension Daughter    Anxiety disorder Maternal Uncle    Hypertension Maternal Grandfather    Alcohol abuse Other        parent   Ataxia Neg Hx    Chorea Neg Hx    Dementia Neg Hx    Mental retardation Neg Hx    Migraines Neg Hx    Multiple sclerosis Neg Hx    Neurofibromatosis Neg Hx    Neuropathy Neg Hx    Parkinsonism Neg Hx    Seizures Neg Hx    Stroke Neg Hx    Colon cancer Neg Hx    Colon polyps Neg Hx    Esophageal cancer Neg Hx    Rectal cancer Neg Hx    Stomach cancer Neg Hx     Social History   Socioeconomic History   Marital status: Widowed    Spouse name: Not on file   Number of children: 5   Years of education: Not on file   Highest education level: Not on file  Occupational History   Occupation: retired  Tobacco Use   Smoking status: Never   Smokeless tobacco: Never    Tobacco comments:    dtr 2 g-kids.  Vaping Use   Vaping status: Never Used  Substance and Sexual Activity   Alcohol use: No    Comment: rare   Drug use: No   Sexual activity: Not Currently  Other Topics Concern   Not on file  Social History Narrative   Right handed   One story with daughter and two grandkids   Social Drivers  of Health   Financial Resource Strain: Low Risk  (06/04/2022)   Overall Financial Resource Strain (CARDIA)    Difficulty of Paying Living Expenses: Not hard at all  Food Insecurity: No Food Insecurity (02/05/2024)   Hunger Vital Sign    Worried About Running Out of Food in the Last Year: Never true    Ran Out of Food in the Last Year: Never true  Transportation Needs: No Transportation Needs (02/05/2024)   PRAPARE - Administrator, Civil Service (Medical): No    Lack of Transportation (Non-Medical): No  Physical Activity: Insufficiently Active (06/04/2022)   Exercise Vital Sign    Days of Exercise per Week: 4 days    Minutes of Exercise per Session: 30 min  Stress: No Stress Concern Present (06/04/2022)   [employer] of Occupational Health - Occupational Stress Questionnaire    Feeling of Stress : Not at all  Social Connections: Moderately Integrated (01/30/2024)   Social Connection and Isolation Panel [NHANES]    Frequency of Communication with Friends and Family: Three times a week    Frequency of Social Gatherings with Friends and Family: Once a week    Attends Religious Services: More than 4 times per year    Active Member of [employer] or Organizations: Yes    Attends Banker Meetings: More than 4 times per year    Marital Status: Widowed  Intimate Partner Violence: Not At Risk (02/05/2024)   Humiliation, Afraid, Rape, and Kick questionnaire    Fear of Current or Ex-Partner: No    Emotionally Abused: No    Physically Abused: No    Sexually Abused: No    Outpatient Medications Prior to Visit  Medication Sig Dispense Refill    albuterol  (VENTOLIN  HFA) 108 (90 Base) MCG/ACT inhaler INHALE 2 PUFFS BY MOUTH EVERY 6 HOURS AS NEEDED FOR WHEEZING FOR SHORTNESS OF BREATH (Patient taking differently: Inhale 2 puffs into the lungs every 6 (six) hours as needed for wheezing or shortness of breath. INHALE 2 PUFFS BY MOUTH EVERY 6 HOURS AS NEEDED FOR WHEEZING FOR SHORTNESS OF BREATH) 18 g 12   alendronate  (FOSAMAX ) 70 MG tablet Take 1 tablet (70 mg total) by mouth once a week. Take with a full glass of water on an empty stomach. (Patient taking differently: Take 70 mg by mouth once a week. Take with a full glass of water on an empty stomach. On Saturdays) 4 tablet 5   ALPRAZolam  (XANAX ) 0.5 MG tablet TAKE 1 TABLET BY MOUTH TWICE DAILY AS NEEDED FOR ANXIETY AND FOR SLEEP (Patient taking differently: Take 0.25 mg by mouth 2 (two) times daily as needed for anxiety or sleep.) 20 tablet 0   amLODipine  (NORVASC ) 2.5 MG tablet Take 1 tablet (2.5 mg total) by mouth daily. 30 tablet 3   aspirin  EC 81 MG tablet Take 1 tablet (81 mg total) by mouth daily. Swallow whole. 30 tablet 3   azelastine  (ASTELIN ) 0.1 % nasal spray Place 2 sprays into both nostrils 2 (two) times daily as needed for rhinitis. (Patient not taking: Reported on 02/05/2024) 30 mL 5   azelastine  (OPTIVAR ) 0.05 % ophthalmic solution Place 1 drop into both eyes 2 (two) times daily as needed. 6 mL 5   b complex vitamins tablet Take 1 tablet by mouth daily.     Cholecalciferol (VITAMIN D ) 50 MCG (2000 UT) tablet Take 2,000 Units by mouth daily.     clopidogrel  (PLAVIX ) 75 MG tablet Take 1 tablet (75 mg  total) by mouth daily with breakfast. 30 tablet 3   EPINEPHrine  0.3 mg/0.3 mL IJ SOAJ injection Inject 0.3 mg into the muscle as needed for anaphylaxis. (Patient not taking: Reported on 02/05/2024) 2 each 2   fluticasone  (FLONASE ) 50 MCG/ACT nasal spray Use 1-2 sprays in each nostril once  a day as needed for stuffy nose. In the right nostril, point the applicator out toward the right  ear. In the left nostril, point the applicator out toward the left ear (Patient not taking: Reported on 02/05/2024) 16 g 5   Fluticasone  Propionate (XHANCE  NA) Place 2 sprays into the nose 2 (two) times daily as needed.     furosemide  (LASIX ) 20 MG tablet Take 1 tablet (20 mg total) by mouth 3 (three) times a week. Monday, Wednesday and Friday. (Patient not taking: Reported on 02/05/2024) 90 tablet 3   levocetirizine (XYZAL ) 5 MG tablet TAKE 1 TABLET BY MOUTH ONCE DAILY IN THE EVENING 30 tablet 5   MAGNESIUM  CITRATE PO Take 1 tablet by mouth daily.     montelukast  (SINGULAIR ) 10 MG tablet TAKE 1 TABLET BY MOUTH AT BEDTIME 30 tablet 5   Multiple Vitamin (MULTIVITAMIN) tablet Take 1 tablet by mouth daily.     nitroGLYCERIN  (NITROSTAT ) 0.4 MG SL tablet Place 1 tablet (0.4 mg total) under the tongue every 5 (five) minutes as needed for chest pain. 25 tablet 1   Pseudoephedrine-Ibuprofen (ADVIL COLD/SINUS PO) Take 2 tablets by mouth as needed.     rosuvastatin  (CRESTOR ) 20 MG tablet Take 1 tablet (20 mg total) by mouth daily. 30 tablet 3   spironolactone  (ALDACTONE ) 25 MG tablet Take 1 tablet (25 mg total) by mouth daily. 90 tablet 2   UNABLE TO FIND Take 2 capsules by mouth daily. Fish Oil w/ tumeric     valsartan  (DIOVAN ) 40 MG tablet Take 1 tablet (40 mg total) by mouth daily. 30 tablet 5   empagliflozin  (JARDIANCE ) 10 MG TABS tablet Take 1 tablet (10 mg total) by mouth daily before breakfast. 90 tablet 3   No facility-administered medications prior to visit.    Allergies  Allergen Reactions   Codeine Nausea And Vomiting   Hydralazine  Hcl Other (See Comments)    Pt reports causes dizziness   Lisinopril-Hydrochlorothiazide Hives    REACTION: lip swelling,facial swelling,rash.   Metoprolol  Other (See Comments)    Does not feel well on it    Other Cough    Patient states she is allergic to Shrimp, egg whites, tomatoes and dairy products.  Says they make her feel unwell but do not cause hives,  SOB or anaphylaxis.  She just avoids eating them because they can make her have a headache.  Strawberries, watermelon, peanuts, and all shellfish.    environmental   Shrimp [Shellfish Allergy ] Other (See Comments)    Was allergy  tested and showed shrimp was one--has not reacted b/c avoided    ROS     Objective:     Physical Exam Vitals reviewed.  Constitutional:      General: She is not in acute distress.    Appearance: Normal appearance. She is not ill-appearing, toxic-appearing or diaphoretic.  HENT:     Head: Normocephalic and atraumatic.     Nose: Nose normal.     Mouth/Throat:     Mouth: Mucous membranes are moist.  Cardiovascular:     Rate and Rhythm: Normal rate and regular rhythm.     Pulses: Normal pulses.     Heart sounds: Normal  heart sounds. No murmur heard.    No friction rub. No gallop.  Pulmonary:     Effort: Pulmonary effort is normal. No respiratory distress.     Breath sounds: Normal breath sounds.     Comments: Clear  Musculoskeletal:        General: Normal range of motion.     Cervical back: Normal range of motion.     Right lower leg: No edema.     Left lower leg: No edema.  Skin:    General: Skin is warm and dry.     Capillary Refill: Capillary refill takes less than 2 seconds.  Neurological:     General: No focal deficit present.     Mental Status: She is alert and oriented to person, place, and time.  Psychiatric:        Mood and Affect: Mood normal.        Behavior: Behavior normal.        Thought Content: Thought content normal.        Judgment: Judgment normal.     BP (!) 142/70   Pulse 90   Ht 4\' 11"  (1.499 m)   Wt 224 lb (101.6 kg)   SpO2 98%   BMI 45.24 kg/m  Wt Readings from Last 3 Encounters:  02/12/24 224 lb (101.6 kg)  01/30/24 222 lb 10.6 oz (101 kg)  01/23/24 223 lb 9.6 oz (101.4 kg)       Assessment & Plan:   Problem List Items Addressed This Visit     Acute ST elevation myocardial infarction (STEMI) of  inferior wall (HCC)   Inferior MI with DES placement. Per Cardiology- Continue DAPT with ASA 81mg  and Plavix  75mg  daily x1 year and No beta blocker with baseline bradycardia.  Encouraged Pt to maintain Low-sodium, heart-healthy diet, Weight management. Discuss warning signs to when seek immediate care (e.g., chest pain recurrence). FU with Cardiology this week. Recheck lipids 8 weeks.       GERD   Pt having increase acid refulx. Started protonix  40 mg. Advised pt to Avoid offending foods, start probiotics. Do not eat large meals in late evening and consider raising head of bed      Relevant Medications   pantoprazole  (PROTONIX ) 40 MG tablet   Hospital discharge follow-up - Primary   Relevant Orders   Basic metabolic panel with GFR   Other Visit Diagnoses       Light headedness       Relevant Orders   EKG 12-Lead (Completed)      EKG performed to evaluate for potential CV cause of dizziness/lightheadedness. I have personally reviewed the EKG, which is consistent with prior tracings. No acute concerns. I have personally seen and reviewed EKG-  02/12/2024-JLY  Lightheadedness Intermittent dizziness and lightheadedness, possibly related to hydration. Recheck BMP. - Encourage consistent hydration and protein intake. - Monitor symptoms and report any escalation.  I am having Anne Barnes start on pantoprazole . I am also having her maintain her multivitamin, b complex vitamins, furosemide , Vitamin D , alendronate , nitroGLYCERIN , azelastine , azelastine , fluticasone , albuterol , levocetirizine, amLODipine , spironolactone , montelukast , ALPRAZolam , valsartan , EPINEPHrine , Fluticasone  Propionate (XHANCE  NA), Pseudoephedrine-Ibuprofen (ADVIL COLD/SINUS PO), MAGNESIUM  CITRATE PO, UNABLE TO FIND, aspirin  EC, rosuvastatin , and clopidogrel .  Meds ordered this encounter  Medications   pantoprazole  (PROTONIX ) 40 MG tablet    Sig: Take 1 tablet (40 mg total) by mouth daily.    Dispense:  90 tablet     Refill:  3    Supervising Provider:  BLYTH, STACEY A [[id number]]

## 2024-02-12 NOTE — Assessment & Plan Note (Addendum)
 Inferior MI with DES placement. Per Cardiology- Continue DAPT with ASA 81mg  and Plavix  75mg  daily x1 year and No beta blocker with baseline bradycardia.  Encouraged Pt to maintain Low-sodium, heart-healthy diet, Weight management. Discuss warning signs to when seek immediate care (e.g., chest pain recurrence). FU with Cardiology this week. Recheck lipids 8 weeks.

## 2024-02-12 NOTE — Assessment & Plan Note (Signed)
 Pt having increase acid refulx. Started protonix 40 mg. Advised pt to Avoid offending foods, start probiotics. Do not eat large meals in late evening and consider raising head of bed

## 2024-02-13 ENCOUNTER — Telehealth: Payer: Self-pay | Admitting: *Deleted

## 2024-02-13 LAB — BASIC METABOLIC PANEL WITH GFR
BUN: 27 mg/dL — ABNORMAL HIGH (ref 6–23)
CO2: 27 meq/L (ref 19–32)
Calcium: 9.5 mg/dL (ref 8.4–10.5)
Chloride: 98 meq/L (ref 96–112)
Creatinine, Ser: 1.16 mg/dL (ref 0.40–1.20)
GFR: 45.82 mL/min — ABNORMAL LOW (ref >60.00)
Glucose, Bld: 86 mg/dL (ref 70–99)
Potassium: 4.8 meq/L (ref 3.5–5.1)
Sodium: 136 meq/L (ref 135–145)

## 2024-02-13 NOTE — Progress Notes (Signed)
 Cardiology Office Note:    Date:  02/14/2024  ID:  Anne Barnes, DOB 09-02-1946, MRN [id number] PCP: Neda Balk, MD  [facility] Providers Cardiologist:  Jann Melody, MD Cardiology APP:  Gabino Joe, PA-C     Patient Profile:     Coronary artery disease Myoview  03/26/2017: No ischemia Non-obs dz - CCTA 10/31/2019: CAC score 127; RCA proximal and mid 25-49, LAD proximal and mid 0-25 Inferior STEMI 01/2024 s/p 3.5 x 20 mm DES to the proximal RCA LHC 01/30/2024: LM, LAD, LCx normal; proximal RCA 99 (HFpEF) heart failure with preserved ejection fraction TTE 03/31/2021: EF 60-65, no RWMA, normal RVSF, trivial MR  TTE 11/05/2022: EF 55-60, no RWMA, mild concentric LVH, GR 2 DD, normal RVSF, moderate LAE, trivial MR, mild AV calcification, RAP 3 TTE 01/31/2024: EF 60-65, no RWMA, mild LVH, normal RVSF, trivial MR, AV sclerosis Bradycardia 2/2 beta-blocker  Cardiac monitor 12/2019: Sinus bradycardia, average heart rate 57, short episodes of SVT Hypertension Hyperlipidemia OSA intol to CPAP Asthma IBS   GERD Obesity        Discussed the use of AI scribe software for clinical note transcription with the patient, who gave verbal consent to proceed.  History of Present Illness Anne Barnes is a 78 y.o. female who returns for posthospitalization follow-up.  She was last seen in February 2025.  She was admitted 5/8-5/9 with an inferior STEMI.  Emergent cardiac catheterization demonstrated 99% proximal RCA stenosis which was treated with a DES.  LVEDP was normal.  Echocardiogram demonstrated normal LV function.  Dual antiplatelet therapy recommended for 12 months.  Rosuvastatin  increased to 20 mg daily.  She is here with her grandson. She feels 'lucky' and 'like I have a new lease here right now' following her treatment. She experiences occasional tiredness and intermittent chest discomfort, which she attributes to acid reflux. Initially, she mistook her heart attack  symptoms for acid reflux, as the pain subsided after taking medication. However, the pain returned two days later, prompting her to seek medical attention. She experiences shortness of breath occasionally, which she attributes to allergies causing mucus buildup. No difficulty breathing during normal activities at home, but increased shortness of breath with more exertion, such as walking to the clinic. No significant swelling in her legs since discharge, noting that her leg swelling is 'about the same' and has not worsened. She wants to start cardiac rehabilitation and recalls enjoying walking four miles every morning in the past. She is excited about returning to physical activity and gardening.    Review of Systems  Gastrointestinal:  Positive for heartburn.  -See HPI    Studies Reviewed:  EKG Interpretation Date/Time:  Friday Feb 14 2024 08:37:49 EDT Ventricular Rate:  84 PR Interval:  162 QRS Duration:  92 QT Interval:  394 QTC Calculation: 465 R Axis:   -29  Text Interpretation: Normal sinus rhythm Nonspecific T wave abnormality Inferior Q waves noted Residual ST elevation in III, aVF compared to prior ECG Confirmed by Marlyse Single [phone]) on 02/14/2024 8:42:27 AM    Results LABS Total cholesterol: 122 (01/31/2024) HDL: 42 (01/31/2024) LDL: 72 (01/31/2024) Triglycerides: 40 (01/31/2024)  Risk Assessment/Calculations:          Physical Exam:  VS:  BP 130/70   Pulse 84   Ht 4\' 11"  (1.499 m)   Wt 218 lb (98.9 kg)   SpO2 99%   BMI 44.03 kg/m    Wt Readings from Last 3 Encounters:  02/14/24 218 lb (98.9 kg)  02/12/24 224 lb (101.6 kg)  01/30/24 222 lb 10.6 oz (101 kg)    Constitutional:      Appearance: Healthy appearance. Not in distress.  Neck:     Vascular: JVD normal.  Pulmonary:     Breath sounds: Normal breath sounds. No wheezing. No rales.  Cardiovascular:     Normal rate. Regular rhythm.     Murmurs: There is no murmur.     Comments: Right wrist without  hematoma Edema:    Peripheral edema present.    Ankle: bilateral trace edema of the ankle. Abdominal:     Palpations: Abdomen is soft.        Assessment and Plan: Assessment & Plan STEMI involving right coronary artery [facility]) S/p inferior STEMI treated with primary PCI with DES to the RCA. She had no disease in the LAD, LCx. EF was preserved by TTE.  She is doing well without residual angina.  She still has some occasional indigestion that is related to meals and not exertion. - Continue DAPT for 1 year post ACS with Clopidogrel  75 mg once daily, ASA 81 mg once daily.  We discussed the importance of this. - Encouraged her to attend cardiac rehabilitation  - Continue Rosuvastatin  20 mg once daily  - follow up 3 months Chronic heart failure with preserved ejection fraction (HCC) EF 60-65 by recent echocardiogram.  NYHA II-IIb.  Volume status stable.  Of note, LVEDP was normal at her cardiac catheterization. - Continue Jardiance  10 mg once daily, Spironolactone  25 mg once daily, Furosemide  20 mg MWF  Essential hypertension Blood pressure controlled - Continue amlodipine  2.5 mg daily, spironolactone  25 mg daily, valsartan  40 mg daily Pure hypercholesterolemia Rosuvastatin  dose increased in setting of ACS. Goal LDL < 70, ideally < 55.  -Continue Rosuvastatin  20 mg once daily  -Fasting Lipids, LFTs in 3 mos       Cardiac Rehabilitation Eligibility Assessment  The patient is ready to start cardiac rehabilitation from a cardiac standpoint.     Dispo:  Return in about 3 months (around 05/16/2024) for Routine Follow Up, w/ Marlyse Single, PA-C. Signed, Marlyse Single, PA-C

## 2024-02-13 NOTE — Telephone Encounter (Signed)
 Copied from CRM [id number]. Topic: Referral - Question >> Feb 13, 2024  2:13 PM Aisha D wrote: Reason for CRM: Pt stated that she reached out a few times regarding a referral for an otolaryngologist. Pt stated that she is still having ear pains and would like for Dr.Blyth to send a referral for the otolaryngologist. Pt would also like to receive a call back with the status of the referral.

## 2024-02-13 NOTE — Telephone Encounter (Signed)
 Returned patients call and she was advised that a referral was placed on 01/23/24 and a approved to [facility]                     [address] [place], [place], [place] [address]                     Phone: [phone]

## 2024-02-14 ENCOUNTER — Encounter: Payer: Self-pay | Admitting: Physician Assistant

## 2024-02-14 ENCOUNTER — Ambulatory Visit: Attending: Physician Assistant | Admitting: Physician Assistant

## 2024-02-14 VITALS — BP 130/70 | HR 84 | Ht 59.0 in | Wt 218.0 lb

## 2024-02-14 DIAGNOSIS — I10 Essential (primary) hypertension: Secondary | ICD-10-CM

## 2024-02-14 DIAGNOSIS — I5032 Chronic diastolic (congestive) heart failure: Secondary | ICD-10-CM

## 2024-02-14 DIAGNOSIS — I2111 ST elevation (STEMI) myocardial infarction involving right coronary artery: Secondary | ICD-10-CM | POA: Diagnosis not present

## 2024-02-14 DIAGNOSIS — E7800 Pure hypercholesterolemia, unspecified: Secondary | ICD-10-CM

## 2024-02-14 NOTE — Addendum Note (Signed)
 Addended byReyne Cave, Geralyn Knee T on: 02/14/2024 10:01 AM   Modules accepted: Orders

## 2024-02-14 NOTE — Assessment & Plan Note (Signed)
 Rosuvastatin  dose increased in setting of ACS. Goal LDL < 70, ideally < 55.  -Continue Rosuvastatin  20 mg once daily  -Fasting Lipids, LFTs in 3 mos

## 2024-02-14 NOTE — Assessment & Plan Note (Addendum)
 EF 60-65 by recent echocardiogram.  NYHA II-IIb.  Volume status stable.  Of note, LVEDP was normal at her cardiac catheterization. - Continue Jardiance  10 mg once daily, Spironolactone  25 mg once daily, Furosemide  20 mg MWF

## 2024-02-14 NOTE — Assessment & Plan Note (Addendum)
 S/p inferior STEMI treated with primary PCI with DES to the RCA. She had no disease in the LAD, LCx. EF was preserved by TTE.  She is doing well without residual angina.  She still has some occasional indigestion that is related to meals and not exertion. - Continue DAPT for 1 year post ACS with Clopidogrel  75 mg once daily, ASA 81 mg once daily.  We discussed the importance of this. - Encouraged her to attend cardiac rehabilitation  - Continue Rosuvastatin  20 mg once daily  - follow up 3 months

## 2024-02-14 NOTE — Assessment & Plan Note (Signed)
 Blood pressure controlled - Continue amlodipine  2.5 mg daily, spironolactone  25 mg daily, valsartan  40 mg daily

## 2024-02-14 NOTE — Patient Instructions (Signed)
 Medication Instructions:  No changes Continue current medications. *If you need a refill on your cardiac medications before your next appointment, please call your pharmacy*  Lab Work: In 3 months, Fasting Lipids, LFTs Come to the lab on the first floor fasting (nothing to eat or drink after midnight) mid August 2025 You do not need an appointment. You can take medications with water.  If you have labs (blood work) drawn today and your tests are completely normal, you will receive your results only by: MyChart Message (if you have MyChart) OR A paper copy in the mail If you have any lab test that is abnormal or we need to change your treatment, we will call you to review the results.  Follow-Up: At [facility], you and your health needs are our priority.  As part of our continuing mission to provide you with exceptional heart care, our providers are all part of one team.  This team includes your primary Cardiologist (physician) and Advanced Practice Providers or APPs (Physician Assistants and Nurse Practitioners) who all work together to provide you with the care you need, when you need it.  Your next appointment:   3 month(s)  Provider:   Marlyse Single, PA-C          We recommend signing up for the patient portal called "MyChart".  Sign up information is provided on this After Visit Summary.  MyChart is used to connect with patients for Virtual Visits (Telemedicine).  Patients are able to view lab/test results, encounter notes, upcoming appointments, etc.  Non-urgent messages can be sent to your provider as well.   To learn more about what you can do with MyChart, go to [email or web address].   Other Instructions

## 2024-02-21 ENCOUNTER — Encounter (HOSPITAL_COMMUNITY): Payer: Self-pay

## 2024-02-21 ENCOUNTER — Telehealth (HOSPITAL_COMMUNITY): Payer: Self-pay

## 2024-02-21 NOTE — Telephone Encounter (Signed)
Attempted to call patient in regards to Cardiac Rehab - LM on VM   Sent letter 

## 2024-02-25 ENCOUNTER — Encounter (HOSPITAL_COMMUNITY): Payer: Self-pay

## 2024-02-26 ENCOUNTER — Telehealth: Payer: Self-pay | Admitting: Internal Medicine

## 2024-02-26 MED ORDER — ROSUVASTATIN CALCIUM 20 MG PO TABS: 20.0000 mg | ORAL_TABLET | Freq: Every day | ORAL | 3 refills | Status: DC

## 2024-02-26 MED ORDER — CLOPIDOGREL BISULFATE 75 MG PO TABS: 75.0000 mg | ORAL_TABLET | Freq: Every day | ORAL | 3 refills | Status: AC

## 2024-02-26 NOTE — Telephone Encounter (Signed)
*  STAT* If patient is at the pharmacy, call can be transferred to refill team.   1. Which medications need to be refilled? (please list name of each medication and dose if known) clopidogrel  (PLAVIX ) 75 MG tablet  Take 1 tablet (75 mg total) by mouth daily with breakfast.  rosuvastatin  (CRESTOR ) 20 MG tablet  Take 1 tablet (20 mg total) by mouth daily.   2. Would you like to learn more about the convenience, safety, & potential cost savings by using the [facility]? No   3. Are you open to using the [facility] No   4. Which pharmacy/location (including street and city if local pharmacy) is medication to be sent to?[id number] - [place] ([place]), [place] - [address]    5. Do they need a 30 day or 90 day supply? 90 Day Supply

## 2024-02-26 NOTE — Telephone Encounter (Signed)
 RX sent to requested Pharmacy

## 2024-02-28 ENCOUNTER — Ambulatory Visit (INDEPENDENT_AMBULATORY_CARE_PROVIDER_SITE_OTHER): Payer: Self-pay

## 2024-02-28 DIAGNOSIS — J309 Allergic rhinitis, unspecified: Secondary | ICD-10-CM

## 2024-03-02 ENCOUNTER — Encounter (HOSPITAL_COMMUNITY): Payer: Self-pay

## 2024-03-02 ENCOUNTER — Telehealth (HOSPITAL_COMMUNITY): Payer: Self-pay

## 2024-03-03 ENCOUNTER — Encounter (HOSPITAL_COMMUNITY): Payer: Self-pay

## 2024-03-03 ENCOUNTER — Encounter (HOSPITAL_COMMUNITY)
Admission: RE | Admit: 2024-03-03 | Discharge: 2024-03-03 | Disposition: A | Discharge status: Home / Self Care | Source: Ambulatory Visit | Attending: Internal Medicine | Admitting: Internal Medicine

## 2024-03-03 VITALS — BP 134/78 | HR 75 | Ht 60.75 in | Wt 218.9 lb

## 2024-03-03 DIAGNOSIS — I2111 ST elevation (STEMI) myocardial infarction involving right coronary artery: Secondary | ICD-10-CM

## 2024-03-03 DIAGNOSIS — I2119 ST elevation (STEMI) myocardial infarction involving other coronary artery of inferior wall: Secondary | ICD-10-CM | POA: Diagnosis present

## 2024-03-03 DIAGNOSIS — L819 Disorder of pigmentation, unspecified: Secondary | ICD-10-CM | POA: Insufficient documentation

## 2024-03-03 DIAGNOSIS — Z955 Presence of coronary angioplasty implant and graft: Secondary | ICD-10-CM | POA: Insufficient documentation

## 2024-03-03 DIAGNOSIS — Z48812 Encounter for surgical aftercare following surgery on the circulatory system: Secondary | ICD-10-CM | POA: Insufficient documentation

## 2024-03-03 DIAGNOSIS — I252 Old myocardial infarction: Secondary | ICD-10-CM | POA: Diagnosis not present

## 2024-03-03 HISTORY — DX: Hyperlipidemia, unspecified: E78.5

## 2024-03-03 LAB — GLUCOSE, CAPILLARY: Glucose-Capillary: 117 mg/dL — ABNORMAL HIGH (ref 70–99)

## 2024-03-03 NOTE — Progress Notes (Signed)
 Cardiac Rehab Medication Review by a Nurse  Does the patient  feel that his/her medications are working for him/her?  yes  Has the patient been experiencing any side effects to the medications prescribed?  no  Does the patient measure his/her own blood pressure or blood glucose at home?  yes   Does the patient have any problems obtaining medications due to transportation or finances?   no  Understanding of regimen: good Understanding of indications: good Potential of compliance: good    Nurse comments: Anne Barnes is taking her medications as prescribed and has a good understanding of what her medications are for. Anne Barnes checks her blood pressure and oxygen saturations daily.     Anne Barnes [facility] RN 03/03/2024 10:03 AM

## 2024-03-03 NOTE — Progress Notes (Signed)
 Cardiac Individual Treatment Plan  Patient Details  Name: Anne Barnes MRN: [id number] Date of Birth: 1946-07-21 Referring Provider:   Flowsheet Row INTENSIVE CARDIAC REHAB ORIENT from 03/03/2024 in [facility]  Referring Provider Jann Melody, MD       Initial Encounter Date:  Flowsheet Row INTENSIVE CARDIAC REHAB ORIENT from 03/03/2024 in [facility]  Date 03/03/24       Visit Diagnosis: 01/30/24 STEMI  01/30/24 S/P PCI/DES RCA  Patient's Home Medications on Admission:  Current Outpatient Medications:    albuterol  (VENTOLIN  HFA) 108 (90 Base) MCG/ACT inhaler, INHALE 2 PUFFS BY MOUTH EVERY 6 HOURS AS NEEDED FOR WHEEZING FOR SHORTNESS OF BREATH (Patient taking differently: Inhale 2 puffs into the lungs every 6 (six) hours as needed for wheezing or shortness of breath. INHALE 2 PUFFS BY MOUTH EVERY 6 HOURS AS NEEDED FOR WHEEZING FOR SHORTNESS OF BREATH), Disp: 18 g, Rfl: 12   alendronate  (FOSAMAX ) 70 MG tablet, Take 1 tablet (70 mg total) by mouth once a week. Take with a full glass of water on an empty stomach. (Patient taking differently: Take 70 mg by mouth once a week. Take with a full glass of water on an empty stomach. On Saturdays), Disp: 4 tablet, Rfl: 5   ALPRAZolam  (XANAX ) 0.5 MG tablet, TAKE 1 TABLET BY MOUTH TWICE DAILY AS NEEDED FOR ANXIETY AND FOR SLEEP (Patient taking differently: Take 0.25 mg by mouth 2 (two) times daily as needed for anxiety or sleep.), Disp: 20 tablet, Rfl: 0   amLODipine  (NORVASC ) 2.5 MG tablet, Take 1 tablet (2.5 mg total) by mouth daily., Disp: 30 tablet, Rfl: 3   aspirin  EC 81 MG tablet, Take 1 tablet (81 mg total) by mouth daily. Swallow whole., Disp: 30 tablet, Rfl: 3   azelastine  (ASTELIN ) 0.1 % nasal spray, Place 2 sprays into both nostrils 2 (two) times daily as needed for rhinitis., Disp: 30 mL, Rfl: 5   azelastine   (OPTIVAR ) 0.05 % ophthalmic solution, Place 1 drop into both eyes 2 (two) times daily as needed., Disp: 6 mL, Rfl: 5   b complex vitamins tablet, Take 1 tablet by mouth daily., Disp: , Rfl:    Cholecalciferol (VITAMIN D ) 50 MCG (2000 UT) tablet, Take 2,000 Units by mouth daily., Disp: , Rfl:    clopidogrel  (PLAVIX ) 75 MG tablet, Take 1 tablet (75 mg total) by mouth daily with breakfast., Disp: 90 tablet, Rfl: 3   empagliflozin  (JARDIANCE ) 10 MG TABS tablet, Take 1 tablet (10 mg total) by mouth daily before breakfast., Disp: 90 tablet, Rfl: 3   EPINEPHrine  0.3 mg/0.3 mL IJ SOAJ injection, Inject 0.3 mg into the muscle as needed for anaphylaxis., Disp: 2 each, Rfl: 2   fluticasone  (FLONASE ) 50 MCG/ACT nasal spray, Use 1-2 sprays in each nostril once  a day as needed for stuffy nose. In the right nostril, point the applicator out toward the right ear. In the left nostril, point the applicator out toward the left ear, Disp: 16 g, Rfl: 5   Fluticasone  Propionate (XHANCE  NA), Place 2 sprays into the nose 2 (two) times daily as needed., Disp: , Rfl:    furosemide  (LASIX ) 20 MG tablet, Take 1 tablet (20 mg total) by mouth 3 (three) times a week. Monday, Wednesday and Friday., Disp: 90 tablet, Rfl: 3   levocetirizine (XYZAL ) 5 MG tablet, TAKE 1 TABLET BY MOUTH ONCE DAILY IN  THE EVENING, Disp: 30 tablet, Rfl: 5   MAGNESIUM  CITRATE PO, Take 1 tablet by mouth daily., Disp: , Rfl:    montelukast  (SINGULAIR ) 10 MG tablet, TAKE 1 TABLET BY MOUTH AT BEDTIME, Disp: 30 tablet, Rfl: 5   Multiple Vitamin (MULTIVITAMIN) tablet, Take 1 tablet by mouth daily., Disp: , Rfl:    nitroGLYCERIN  (NITROSTAT ) 0.4 MG SL tablet, Place 1 tablet (0.4 mg total) under the tongue every 5 (five) minutes as needed for chest pain., Disp: 25 tablet, Rfl: 1   pantoprazole  (PROTONIX ) 40 MG tablet, Take 1 tablet (40 mg total) by mouth daily., Disp: 90 tablet, Rfl: 3   Pseudoephedrine-Ibuprofen (ADVIL COLD/SINUS PO), Take 2 tablets by mouth as  needed., Disp: , Rfl:    rosuvastatin  (CRESTOR ) 20 MG tablet, Take 1 tablet (20 mg total) by mouth daily., Disp: 90 tablet, Rfl: 3   spironolactone  (ALDACTONE ) 25 MG tablet, Take 1 tablet (25 mg total) by mouth daily., Disp: 90 tablet, Rfl: 2   UNABLE TO FIND, Take 2 capsules by mouth daily. Fish Oil w/ tumeric, Disp: , Rfl:    valsartan  (DIOVAN ) 40 MG tablet, Take 1 tablet (40 mg total) by mouth daily., Disp: 30 tablet, Rfl: 5  Past Medical History: Past Medical History:  Diagnosis Date   Acute combined systolic and diastolic heart failure (HCC) 05/26/2015   ALLERGIC RHINITIS    Allergic urticaria 08/01/2014   Patient reports allergies to shrimp, egg whites, tomatoes, dairy    Allergy     seasonal and numerous food and drug allergies.   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   Office Spirometry 11/07/17-WNL-FVC 1.58/82%, FEV1 1.27/85%, ratio 0.80, FEF 25-75% 0.26/89%   Bradycardia 02/25/2017   CAD (coronary artery disease) 05/01/2023   Myoview  03/26/2017: No ischemia Non-obs dz - CCTA 10/31/2019: CAC score 127; RCA proximal and mid 25-49, LAD proximal and mid 0-25    Chiari malformation    Noted MRI brain 09/2013 - s/p neuro eval for same   DIVERTICULOSIS, COLON    Essential hypertension 08/28/2010   Qualifier: Diagnosis of  By: Georganne Kind RMA, Lucy      Frequent headaches    GERD    H/O measles    H/O mumps    Hearing loss 01/18/2017   History of chicken pox    Hyperglycemia 03/05/2016   Hyperlipidemia    HYPERTENSION    Hypocalcemia 02/25/2017   IBS (irritable bowel syndrome) 05/26/2015   Nonspecific abnormal electrocardiogram (ECG) (EKG)    OBSTRUCTIVE SLEEP APNEA 12/2008 dx   noncompliant with CPAP qhs   Obstructive sleep apnea 08/28/2010   NPSG 12/2008:  AHI 13/hr with desats to 78%    Preventative health care 06/11/2017   Sciatica    right side   Seasonal and perennial allergic rhinitis 08/28/2010   Allergy  profile 03/11/14- positive especially for dust, cat and dog Food allergy   profile- total IgE 86 with several food group elevations Sed rate-WNL      Sinusitis 06/06/2017   Sleep apnea    uses cpap   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    Labs: Review Flowsheet  More data exists      Latest Ref Rng & Units 03/05/2023 07/25/2023 01/23/2024 01/30/2024 01/31/2024  Labs for ITP Cardiac and Pulmonary Rehab  Cholestrol 0 - 200 mg/dL  111  126  159  - 122   LDL (calc) 0 - 99 mg/dL 60  75  409  - 72   HDL-C >40 mg/dL 81.19  14.78  29.56  - 42   Trlycerides <150 mg/dL 21.3  08.6  57.8  - 40   Hemoglobin A1c 4.8 - 5.6 % 5.6  5.4  5.6  - 5.1   TCO2 22 - 32 mmol/L - - - 27  -    Capillary Blood Glucose: Lab Results  Component Value Date   GLUCAP 117 (H) 03/03/2024   GLUCAP 136 (H) 01/31/2024   GLUCAP 98 01/31/2024   GLUCAP 111 (H) 01/31/2024   GLUCAP 96 01/30/2024     Exercise Target Goals: Exercise Program Goal: Individual exercise prescription set using results from initial 6 min walk test and THRR while considering  patient's activity barriers and safety.   Exercise Prescription Goal: Initial exercise prescription builds to 30-45 minutes a day of aerobic activity, 2-3 days per week.  Home exercise guidelines will be given to patient during program as part of exercise prescription that the participant will acknowledge.  Activity Barriers & Risk Stratification:  Activity Barriers & Cardiac Risk Stratification - 03/03/24 1144       Activity Barriers & Cardiac Risk Stratification   Activity Barriers Balance Concerns;History of Falls;Assistive Device;Other (comment)    Comments Possible neuropathy in legs/ feet.    Cardiac Risk Stratification High             6 Minute Walk:  6 Minute Walk     Row Name 03/03/24 1200         6 Minute Walk   Phase Initial      Distance 720 feet     Walk Time 6 minutes     # of Rest Breaks 0     MPH 1.36     METS 0.91     RPE 13     Perceived Dyspnea  2     VO2 Peak 3.18     Symptoms Yes (comment)     Comments Mild shortness of breath. Left leg and foot weakness.     Resting HR 75 bpm     Resting BP 134/78     Resting Oxygen Saturation  97 %     Exercise Oxygen Saturation  during 6 min walk 96 %     Max Ex. HR 102 bpm     Max Ex. BP 148/68     2 Minute Post BP 150/72              Oxygen Initial Assessment:   Oxygen Re-Evaluation:   Oxygen Discharge (Final Oxygen Re-Evaluation):   Initial Exercise Prescription:  Initial Exercise Prescription - 03/03/24 1300       Date of Initial Exercise RX and Referring Provider   Date 03/03/24    Referring Provider Jann Melody, MD    Expected Discharge Date 05/27/24      NuStep   Level 1    SPM 75    Minutes 15    METs 1.5      Prescription Details   Frequency (times per week) 3    Duration Progress to 30 minutes of continuous aerobic without signs/symptoms of physical distress      Intensity   THRR 40-80% of Max Heartrate 58-115    Ratings of Perceived Exertion 11-13    Perceived Dyspnea 0-4      Progression   Progression Continue to  progress workloads to maintain intensity without signs/symptoms of physical distress.      Resistance Training   Training Prescription Yes    Weight 2 lbs    Reps 10-15             Perform Capillary Blood Glucose checks as needed.  Exercise Prescription Changes:   Exercise Comments:   Exercise Goals and Review:   Exercise Goals     Row Name 03/03/24 1144             Exercise Goals   Increase Physical Activity Yes       Intervention Provide advice, education, support and counseling about physical activity/exercise needs.;Develop an individualized exercise prescription for aerobic and resistive training based on initial evaluation findings, risk stratification, comorbidities  and participant's personal goals.       Expected Outcomes Short Term: Attend rehab on a regular basis to increase amount of physical activity.;Long Term: Exercising regularly at least 3-5 days a week.;Long Term: Add in home exercise to make exercise part of routine and to increase amount of physical activity.       Increase Strength and Stamina Yes       Intervention Provide advice, education, support and counseling about physical activity/exercise needs.;Develop an individualized exercise prescription for aerobic and resistive training based on initial evaluation findings, risk stratification, comorbidities and participant's personal goals.       Expected Outcomes Short Term: Increase workloads from initial exercise prescription for resistance, speed, and METs.;Short Term: Perform resistance training exercises routinely during rehab and add in resistance training at home;Long Term: Improve cardiorespiratory fitness, muscular endurance and strength as measured by increased METs and functional capacity ( )       Able to understand and use rate of perceived exertion (RPE) scale Yes       Intervention Provide education and explanation on how to use RPE scale       Expected Outcomes Short Term: Able to use RPE daily in rehab to express subjective intensity level;Long Term:  Able to use RPE to guide intensity level when exercising independently       Knowledge and understanding of Target Heart Rate Range (THRR) Yes       Intervention Provide education and explanation of THRR including how the numbers were predicted and where they are located for reference       Expected Outcomes Short Term: Able to state/look up THRR;Long Term: Able to use THRR to govern intensity when exercising independently;Short Term: Able to use daily as guideline for intensity in rehab       Able to check pulse independently Yes       Intervention Provide education and demonstration on how to check pulse in carotid and radial  arteries.;Review the importance of being able to check your own pulse for safety during independent exercise       Expected Outcomes Short Term: Able to explain why pulse checking is important during independent exercise;Long Term: Able to check pulse independently and accurately       Understanding of Exercise Prescription Yes       Intervention Provide education, explanation, and written materials on patient's individual exercise prescription       Expected Outcomes Short Term: Able to explain program exercise prescription;Long Term: Able to explain home exercise prescription to exercise independently                Exercise Goals Re-Evaluation :   Discharge Exercise Prescription (Final Exercise Prescription Changes):  Nutrition:  Target Goals: Understanding of nutrition guidelines, daily intake of sodium 1500mg , cholesterol 200mg , calories 30% from fat and 7% or less from saturated fats, daily to have 5 or more servings of fruits and vegetables.  Biometrics:  Pre Biometrics - 03/03/24 1058       Pre Biometrics   Waist Circumference 51.25 inches    Hip Circumference 53.25 inches    Waist to Hip Ratio 0.96 %    Triceps Skinfold 52 mm    % Body Fat 57.3 %    Grip Strength 8 kg    Flexibility 0 in    Single Leg Stand 2.87 seconds              Nutrition Therapy Plan and Nutrition Goals:   Nutrition Assessments:  MEDIFICTS Score Key: >=70 Need to make dietary changes  40-70 Heart Healthy Diet <= 40 Therapeutic Level Cholesterol Diet    Picture Your Plate Scores: <16 Unhealthy dietary pattern with much room for improvement. 41-50 Dietary pattern unlikely to meet recommendations for good health and room for improvement. 51-60 More healthful dietary pattern, with some room for improvement.  >60 Healthy dietary pattern, although there may be some specific behaviors that could be improved.    Nutrition Goals Re-Evaluation:   Nutrition Goals  Re-Evaluation:   Nutrition Goals Discharge (Final Nutrition Goals Re-Evaluation):   Psychosocial: Target Goals: Acknowledge presence or absence of significant depression and/or stress, maximize coping skills, provide positive support system. Participant is able to verbalize types and ability to use techniques and skills needed for reducing stress and depression.  Initial Review & Psychosocial Screening:  Initial Psych Review & Screening - 03/03/24 1100       Initial Review   Current issues with History of Depression      Family Dynamics   Good Support System? Yes   Anne Barnes live with her daughter and her 2 sons   Comments Anne Barnes lost her husband 4 years ago and her daughter 2 years ago from ALS she was 78 years old      Barriers   Psychosocial barriers to participate in program The patient should benefit from training in stress management and relaxation.      Screening Interventions   Interventions Encouraged to exercise;To provide support and resources with identified psychosocial needs;Provide feedback about the scores to participant    Expected Outcomes Short Term goal: Utilizing psychosocial counselor, staff and physician to assist with identification of specific Stressors or current issues interfering with healing process. Setting desired goal for each stressor or current issue identified.;Long Term Goal: Stressors or current issues are controlled or eliminated.;Short Term goal: Identification and review with participant of any Quality of Life or Depression concerns found by scoring the questionnaire.;Long Term goal: The participant improves quality of Life and PHQ9 Scores as seen by post scores and/or verbalization of changes             Quality of Life Scores:  Quality of Life - 03/03/24 1402       Quality of Life   Select Quality of Life      Quality of Life Scores   Health/Function Pre 25.03 %    Socioeconomic Pre 26.29 %    Psych/Spiritual Pre 28.21 %    Family Pre  30 %    GLOBAL Pre 26.58 %            Scores of 19 and below usually indicate a poorer quality of life in these areas.  A  difference of  2-3 points is a clinically meaningful difference.  A difference of 2-3 points in the total score of the Quality of Life Index has been associated with significant improvement in overall quality of life, self-image, physical symptoms, and general health in studies assessing change in quality of life.  PHQ-9: Review Flowsheet  More data exists      03/03/2024 02/05/2024 07/25/2023 03/05/2023 10/23/2022  Depression screen PHQ 2/9  Decreased Interest 0 0 0 0 0  Down, Depressed, Hopeless 0 0 0 0 0  PHQ - 2 Score 0 0 0 0 0  Altered sleeping 2 - - 0 -  Tired, decreased energy 2 - - 0 -  Change in appetite 0 - - 0 -  Feeling bad or failure about yourself  0 - - 0 -  Trouble concentrating 0 - - 0 -  Moving slowly or fidgety/restless 0 - - 0 -  Suicidal thoughts 0 - - 0 -  PHQ-9 Score 4 - - 0 -  Difficult doing work/chores Somewhat difficult - - Not difficult at all -   Interpretation of Total Score  Total Score Depression Severity:  1-4 = Minimal depression, 5-9 = Mild depression, 10-14 = Moderate depression, 15-19 = Moderately severe depression, 20-27 = Severe depression   Psychosocial Evaluation and Intervention:   Psychosocial Re-Evaluation:   Psychosocial Discharge (Final Psychosocial Re-Evaluation):   Vocational Rehabilitation: Provide vocational rehab assistance to qualifying candidates.   Vocational Rehab Evaluation & Intervention:  Vocational Rehab - 03/03/24 1206       Initial Vocational Rehab Evaluation & Intervention   Assessment shows need for Vocational Rehabilitation No   Anne Barnes is retired and does not need vocational rehab at this time            Education: Education Goals: Education classes will be provided on a weekly basis, covering required topics. Participant will state understanding/return demonstration of topics  presented.     Core Videos: Exercise    Move It!  Clinical staff conducted group or individual video education with verbal and written material and guidebook.  Patient learns the recommended Pritikin exercise program. Exercise with the goal of living a long, healthy life. Some of the health benefits of exercise include controlled diabetes, healthier blood pressure levels, improved cholesterol levels, improved heart and lung capacity, improved sleep, and better body composition. Everyone should speak with their doctor before starting or changing an exercise routine.  Biomechanical Limitations Clinical staff conducted group or individual video education with verbal and written material and guidebook.  Patient learns how biomechanical limitations can impact exercise and how we can mitigate and possibly overcome limitations to have an impactful and balanced exercise routine.  Body Composition Clinical staff conducted group or individual video education with verbal and written material and guidebook.  Patient learns that body composition (ratio of muscle mass to fat mass) is a key component to assessing overall fitness, rather than body weight alone. Increased fat mass, especially visceral belly fat, can put us  at increased risk for metabolic syndrome, type 2 diabetes, heart disease, and even death. It is recommended to combine diet and exercise (cardiovascular and resistance training) to improve your body composition. Seek guidance from your physician and exercise physiologist before implementing an exercise routine.  Exercise Action Plan Clinical staff conducted group or individual video education with verbal and written material and guidebook.  Patient learns the recommended strategies to achieve and enjoy long-term exercise adherence, including variety, self-motivation, self-efficacy, and positive decision making.  Benefits of exercise include fitness, good health, weight management, more energy,  better sleep, less stress, and overall well-being.  Medical   Heart Disease Risk Reduction Clinical staff conducted group or individual video education with verbal and written material and guidebook.  Patient learns our heart is our most vital organ as it circulates oxygen, nutrients, white blood cells, and hormones throughout the entire body, and carries waste away. Data supports a plant-based eating plan like the Pritikin Program for its effectiveness in slowing progression of and reversing heart disease. The video provides a number of recommendations to address heart disease.   Metabolic Syndrome and Belly Fat  Clinical staff conducted group or individual video education with verbal and written material and guidebook.  Patient learns what metabolic syndrome is, how it leads to heart disease, and how one can reverse it and keep it from coming back. You have metabolic syndrome if you have 3 of the following 5 criteria: abdominal obesity, high blood pressure, high triglycerides, low HDL cholesterol, and high blood sugar.  Hypertension and Heart Disease Clinical staff conducted group or individual video education with verbal and written material and guidebook.  Patient learns that high blood pressure, or hypertension, is very common in the [place] . Hypertension is largely due to excessive salt intake, but other important risk factors include being overweight, physical inactivity, drinking too much alcohol, smoking, and not eating enough potassium from fruits and vegetables. High blood pressure is a leading risk factor for heart attack, stroke, congestive heart failure, dementia, kidney failure, and premature death. Long-term effects of excessive salt intake include stiffening of the arteries and thickening of heart muscle and organ damage. Recommendations include ways to reduce hypertension and the risk of heart disease.  Diseases of Our Time - Focusing on Diabetes Clinical staff conducted group  or individual video education with verbal and written material and guidebook.  Patient learns why the best way to stop diseases of our time is prevention, through food and other lifestyle changes. Medicine (such as prescription pills and surgeries) is often only a Band-Aid on the problem, not a long-term solution. Most common diseases of our time include obesity, type 2 diabetes, hypertension, heart disease, and cancer. The Pritikin Program is recommended and has been proven to help reduce, reverse, and/or prevent the damaging effects of metabolic syndrome.  Nutrition   Overview of the Pritikin Eating Plan  Clinical staff conducted group or individual video education with verbal and written material and guidebook.  Patient learns about the Pritikin Eating Plan for disease risk reduction. The Pritikin Eating Plan emphasizes a wide variety of unrefined, minimally-processed carbohydrates, like fruits, vegetables, whole grains, and legumes. Go, Caution, and Stop food choices are explained. Plant-based and lean animal proteins are emphasized. Rationale provided for low sodium intake for blood pressure control, low added sugars for blood sugar stabilization, and low added fats and oils for coronary artery disease risk reduction and weight management.  Calorie Density  Clinical staff conducted group or individual video education with verbal and written material and guidebook.  Patient learns about calorie density and how it impacts the Pritikin Eating Plan. Knowing the characteristics of the food you choose will help you decide whether those foods will lead to weight gain or weight loss, and whether you want to consume more or less of them. Weight loss is usually a side effect of the Pritikin Eating Plan because of its focus on low calorie-dense foods.  Label Reading  Clinical staff conducted group or individual  video education with verbal and written material and guidebook.  Patient learns about the Pritikin  recommended label reading guidelines and corresponding recommendations regarding calorie density, added sugars, sodium content, and whole grains.  Dining Out - Part 1  Clinical staff conducted group or individual video education with verbal and written material and guidebook.  Patient learns that restaurant meals can be sabotaging because they can be so high in calories, fat, sodium, and/or sugar. Patient learns recommended strategies on how to positively address this and avoid unhealthy pitfalls.  Facts on Fats  Clinical staff conducted group or individual video education with verbal and written material and guidebook.  Patient learns that lifestyle modifications can be just as effective, if not more so, as many medications for lowering your risk of heart disease. A Pritikin lifestyle can help to reduce your risk of inflammation and atherosclerosis (cholesterol build-up, or plaque, in the artery walls). Lifestyle interventions such as dietary choices and physical activity address the cause of atherosclerosis. A review of the types of fats and their impact on blood cholesterol levels, along with dietary recommendations to reduce fat intake is also included.  Nutrition Action Plan  Clinical staff conducted group or individual video education with verbal and written material and guidebook.  Patient learns how to incorporate Pritikin recommendations into their lifestyle. Recommendations include planning and keeping personal health goals in mind as an important part of their success.  Healthy Mind-Set    Healthy Minds, Bodies, Hearts  Clinical staff conducted group or individual video education with verbal and written material and guidebook.  Patient learns how to identify when they are stressed. Video will discuss the impact of that stress, as well as the many benefits of stress management. Patient will also be introduced to stress management techniques. The way we think, act, and feel has an impact on  our hearts.  How Our Thoughts Can Heal Our Hearts  Clinical staff conducted group or individual video education with verbal and written material and guidebook.  Patient learns that negative thoughts can cause depression and anxiety. This can result in negative lifestyle behavior and serious health problems. Cognitive behavioral therapy is an effective method to help control our thoughts in order to change and improve our emotional outlook.  Additional Videos:  Exercise    Improving Performance  Clinical staff conducted group or individual video education with verbal and written material and guidebook.  Patient learns to use a non-linear approach by alternating intensity levels and lengths of time spent exercising to help burn more calories and lose more body fat. Cardiovascular exercise helps improve heart health, metabolism, hormonal balance, blood sugar control, and recovery from fatigue. Resistance training improves strength, endurance, balance, coordination, reaction time, metabolism, and muscle mass. Flexibility exercise improves circulation, posture, and balance. Seek guidance from your physician and exercise physiologist before implementing an exercise routine and learn your capabilities and proper form for all exercise.  Introduction to Yoga  Clinical staff conducted group or individual video education with verbal and written material and guidebook.  Patient learns about yoga, a discipline of the coming together of mind, breath, and body. The benefits of yoga include improved flexibility, improved range of motion, better posture and core strength, increased lung function, weight loss, and positive self-image. Yoga's heart health benefits include lowered blood pressure, healthier heart rate, decreased cholesterol and triglyceride levels, improved immune function, and reduced stress. Seek guidance from your physician and exercise physiologist before implementing an exercise routine and learn  your capabilities and  proper form for all exercise.  Medical   Aging: Enhancing Your Quality of Life  Clinical staff conducted group or individual video education with verbal and written material and guidebook.  Patient learns key strategies and recommendations to stay in good physical health and enhance quality of life, such as prevention strategies, having an advocate, securing a Health Care Proxy and Power of Attorney, and keeping a list of medications and system for tracking them. It also discusses how to avoid risk for bone loss.  Biology of Weight Control  Clinical staff conducted group or individual video education with verbal and written material and guidebook.  Patient learns that weight gain occurs because we consume more calories than we burn (eating more, moving less). Even if your body weight is normal, you may have higher ratios of fat compared to muscle mass. Too much body fat puts you at increased risk for cardiovascular disease, heart attack, stroke, type 2 diabetes, and obesity-related cancers. In addition to exercise, following the Pritikin Eating Plan can help reduce your risk.  Decoding Lab Results  Clinical staff conducted group or individual video education with verbal and written material and guidebook.  Patient learns that lab test reflects one measurement whose values change over time and are influenced by many factors, including medication, stress, sleep, exercise, food, hydration, pre-existing medical conditions, and more. It is recommended to use the knowledge from this video to become more involved with your lab results and evaluate your numbers to speak with your doctor.   Diseases of Our Time - Overview  Clinical staff conducted group or individual video education with verbal and written material and guidebook.  Patient learns that according to the CDC, 50% to 70% of chronic diseases (such as obesity, type 2 diabetes, elevated lipids, hypertension, and heart disease)  are avoidable through lifestyle improvements including healthier food choices, listening to satiety cues, and increased physical activity.  Sleep Disorders Clinical staff conducted group or individual video education with verbal and written material and guidebook.  Patient learns how good quality and duration of sleep are important to overall health and well-being. Patient also learns about sleep disorders and how they impact health along with recommendations to address them, including discussing with a physician.  Nutrition  Dining Out - Part 2 Clinical staff conducted group or individual video education with verbal and written material and guidebook.  Patient learns how to plan ahead and communicate in order to maximize their dining experience in a healthy and nutritious manner. Included are recommended food choices based on the type of restaurant the patient is visiting.   Fueling a Banker conducted group or individual video education with verbal and written material and guidebook.  There is a strong connection between our food choices and our health. Diseases like obesity and type 2 diabetes are very prevalent and are in large-part due to lifestyle choices. The Pritikin Eating Plan provides plenty of food and hunger-curbing satisfaction. It is easy to follow, affordable, and helps reduce health risks.  Menu Workshop  Clinical staff conducted group or individual video education with verbal and written material and guidebook.  Patient learns that restaurant meals can sabotage health goals because they are often packed with calories, fat, sodium, and sugar. Recommendations include strategies to plan ahead and to communicate with the manager, chef, or server to help order a healthier meal.  Planning Your Eating Strategy  Clinical staff conducted group or individual video education with verbal and written material and guidebook.  Patient learns about the Pritikin Eating Plan  and its benefit of reducing the risk of disease. The Pritikin Eating Plan does not focus on calories. Instead, it emphasizes high-quality, nutrient-rich foods. By knowing the characteristics of the foods, we choose, we can determine their calorie density and make informed decisions.  Targeting Your Nutrition Priorities  Clinical staff conducted group or individual video education with verbal and written material and guidebook.  Patient learns that lifestyle habits have a tremendous impact on disease risk and progression. This video provides eating and physical activity recommendations based on your personal health goals, such as reducing LDL cholesterol, losing weight, preventing or controlling type 2 diabetes, and reducing high blood pressure.  Vitamins and Minerals  Clinical staff conducted group or individual video education with verbal and written material and guidebook.  Patient learns different ways to obtain key vitamins and minerals, including through a recommended healthy diet. It is important to discuss all supplements you take with your doctor.   Healthy Mind-Set    Smoking Cessation  Clinical staff conducted group or individual video education with verbal and written material and guidebook.  Patient learns that cigarette smoking and tobacco addiction pose a serious health risk which affects millions of people. Stopping smoking will significantly reduce the risk of heart disease, lung disease, and many forms of cancer. Recommended strategies for quitting are covered, including working with your doctor to develop a successful plan.  Culinary   Becoming a Set designer conducted group or individual video education with verbal and written material and guidebook.  Patient learns that cooking at home can be healthy, cost-effective, quick, and puts them in control. Keys to cooking healthy recipes will include looking at your recipe, assessing your equipment needs, planning  ahead, making it simple, choosing cost-effective seasonal ingredients, and limiting the use of added fats, salts, and sugars.  Cooking - Breakfast and Snacks  Clinical staff conducted group or individual video education with verbal and written material and guidebook.  Patient learns how important breakfast is to satiety and nutrition through the entire day. Recommendations include key foods to eat during breakfast to help stabilize blood sugar levels and to prevent overeating at meals later in the day. Planning ahead is also a key component.  Cooking - Educational psychologist conducted group or individual video education with verbal and written material and guidebook.  Patient learns eating strategies to improve overall health, including an approach to cook more at home. Recommendations include thinking of animal protein as a side on your plate rather than center stage and focusing instead on lower calorie dense options like vegetables, fruits, whole grains, and plant-based proteins, such as beans. Making sauces in large quantities to freeze for later and leaving the skin on your vegetables are also recommended to maximize your experience.  Cooking - Healthy Salads and Dressing Clinical staff conducted group or individual video education with verbal and written material and guidebook.  Patient learns that vegetables, fruits, whole grains, and legumes are the foundations of the Pritikin Eating Plan. Recommendations include how to incorporate each of these in flavorful and healthy salads, and how to create homemade salad dressings. Proper handling of ingredients is also covered. Cooking - Soups and [employer] - Soups and Desserts Clinical staff conducted group or individual video education with verbal and written material and guidebook.  Patient learns that Pritikin soups and desserts make for easy, nutritious, and delicious snacks and meal components that are low  in sodium, fat, sugar,  and calorie density, while high in vitamins, minerals, and filling fiber. Recommendations include simple and healthy ideas for soups and desserts.   Overview     The Pritikin Solution Program Overview Clinical staff conducted group or individual video education with verbal and written material and guidebook.  Patient learns that the results of the Pritikin Program have been documented in more than 100 articles published in peer-reviewed journals, and the benefits include reducing risk factors for (and, in some cases, even reversing) high cholesterol, high blood pressure, type 2 diabetes, obesity, and more! An overview of the three key pillars of the Pritikin Program will be covered: eating well, doing regular exercise, and having a healthy mind-set.  WORKSHOPS  Exercise: Exercise Basics: Building Your Action Plan Clinical staff led group instruction and group discussion with PowerPoint presentation and patient guidebook. To enhance the learning environment the use of posters, models and videos may be added. At the conclusion of this workshop, patients will comprehend the difference between physical activity and exercise, as well as the benefits of incorporating both, into their routine. Patients will understand the FITT (Frequency, Intensity, Time, and Type) principle and how to use it to build an exercise action plan. In addition, safety concerns and other considerations for exercise and cardiac rehab will be addressed by the presenter. The purpose of this lesson is to promote a comprehensive and effective weekly exercise routine in order to improve patients' overall level of fitness.   Managing Heart Disease: Your Path to a Healthier Heart Clinical staff led group instruction and group discussion with PowerPoint presentation and patient guidebook. To enhance the learning environment the use of posters, models and videos may be added.At the conclusion of this workshop, patients will understand  the anatomy and physiology of the heart. Additionally, they will understand how Pritikin's three pillars impact the risk factors, the progression, and the management of heart disease.  The purpose of this lesson is to provide a high-level overview of the heart, heart disease, and how the Pritikin lifestyle positively impacts risk factors.  Exercise Biomechanics Clinical staff led group instruction and group discussion with PowerPoint presentation and patient guidebook. To enhance the learning environment the use of posters, models and videos may be added. Patients will learn how the structural parts of their bodies function and how these functions impact their daily activities, movement, and exercise. Patients will learn how to promote a neutral spine, learn how to manage pain, and identify ways to improve their physical movement in order to promote healthy living. The purpose of this lesson is to expose patients to common physical limitations that impact physical activity. Participants will learn practical ways to adapt and manage aches and pains, and to minimize their effect on regular exercise. Patients will learn how to maintain good posture while sitting, walking, and lifting.  Balance Training and Fall Prevention  Clinical staff led group instruction and group discussion with PowerPoint presentation and patient guidebook. To enhance the learning environment the use of posters, models and videos may be added. At the conclusion of this workshop, patients will understand the importance of their sensorimotor skills (vision, proprioception, and the vestibular system) in maintaining their ability to balance as they age. Patients will apply a variety of balancing exercises that are appropriate for their current level of function. Patients will understand the common causes for poor balance, possible solutions to these problems, and ways to modify their physical environment in order to minimize  their fall risk.  The purpose of this lesson is to teach patients about the importance of maintaining balance as they age and ways to minimize their risk of falling.  WORKSHOPS   Nutrition:  Fueling a Ship broker led group instruction and group discussion with PowerPoint presentation and patient guidebook. To enhance the learning environment the use of posters, models and videos may be added. Patients will review the foundational principles of the Pritikin Eating Plan and understand what constitutes a serving size in each of the food groups. Patients will also learn Pritikin-friendly foods that are better choices when away from home and review make-ahead meal and snack options. Calorie density will be reviewed and applied to three nutrition priorities: weight maintenance, weight loss, and weight gain. The purpose of this lesson is to reinforce (in a group setting) the key concepts around what patients are recommended to eat and how to apply these guidelines when away from home by planning and selecting Pritikin-friendly options. Patients will understand how calorie density may be adjusted for different weight management goals.  Mindful Eating  Clinical staff led group instruction and group discussion with PowerPoint presentation and patient guidebook. To enhance the learning environment the use of posters, models and videos may be added. Patients will briefly review the concepts of the Pritikin Eating Plan and the importance of low-calorie dense foods. The concept of mindful eating will be introduced as well as the importance of paying attention to internal hunger signals. Triggers for non-hunger eating and techniques for dealing with triggers will be explored. The purpose of this lesson is to provide patients with the opportunity to review the basic principles of the Pritikin Eating Plan, discuss the value of eating mindfully and how to measure internal cues of hunger and fullness using the  Hunger Scale. Patients will also discuss reasons for non-hunger eating and learn strategies to use for controlling emotional eating.  Targeting Your Nutrition Priorities Clinical staff led group instruction and group discussion with PowerPoint presentation and patient guidebook. To enhance the learning environment the use of posters, models and videos may be added. Patients will learn how to determine their genetic susceptibility to disease by reviewing their family history. Patients will gain insight into the importance of diet as part of an overall healthy lifestyle in mitigating the impact of genetics and other environmental insults. The purpose of this lesson is to provide patients with the opportunity to assess their personal nutrition priorities by looking at their family history, their own health history and current risk factors. Patients will also be able to discuss ways of prioritizing and modifying the Pritikin Eating Plan for their highest risk areas  Menu  Clinical staff led group instruction and group discussion with PowerPoint presentation and patient guidebook. To enhance the learning environment the use of posters, models and videos may be added. Using menus brought in from [employer], or printed from [employer], patients will apply the Pritikin dining out guidelines that were presented in the [employer] video. Patients will also be able to practice these guidelines in a variety of provided scenarios. The purpose of this lesson is to provide patients with the opportunity to practice hands-on learning of the Pritikin Dining Out guidelines with actual menus and practice scenarios.  Label Reading Clinical staff led group instruction and group discussion with PowerPoint presentation and patient guidebook. To enhance the learning environment the use of posters, models and videos may be added. Patients will review and discuss the Pritikin label reading guidelines  presented in Pritikin's Label Reading Educational series video. Using fool labels brought in from local grocery stores and markets, patients will apply the label reading guidelines and determine if the packaged food meet the Pritikin guidelines. The purpose of this lesson is to provide patients with the opportunity to review, discuss, and practice hands-on learning of the Pritikin Label Reading guidelines with actual packaged food labels. Cooking School  Pritikin's [employer] are designed to teach patients ways to prepare quick, simple, and affordable recipes at home. The importance of nutrition's role in chronic disease risk reduction is reflected in its emphasis in the overall Pritikin program. By learning how to prepare essential core Pritikin Eating Plan recipes, patients will increase control over what they eat; be able to customize the flavor of foods without the use of added salt, sugar, or fat; and improve the quality of the food they consume. By learning a set of core recipes which are easily assembled, quickly prepared, and affordable, patients are more likely to prepare more healthy foods at home. These workshops focus on convenient breakfasts, simple entres, side dishes, and desserts which can be prepared with minimal effort and are consistent with nutrition recommendations for cardiovascular risk reduction. Cooking [employer] are taught by a Armed forces logistics/support/administrative officer (RD) who has been trained by the [employer]. The chef or RD has a clear understanding of the importance of minimizing - if not completely eliminating - added fat, sugar, and sodium in recipes. Throughout the series of Cooking School Workshop sessions, patients will learn about healthy ingredients and efficient methods of cooking to build confidence in their capability to prepare    Cooking School weekly topics:  Adding Flavor- Sodium-Free  Fast and Healthy Breakfasts  Powerhouse Plant-Based  Proteins  Satisfying Salads and Dressings  Simple Sides and Sauces  International Cuisine-Spotlight on the [employer] Zones  Delicious Desserts  Savory Soups  [employer] - Meals in a Astronomer Appetizers and Snacks  Comforting Weekend Breakfasts  One-Pot Wonders   Fast Evening Meals  Landscape architect Your Pritikin Plate  WORKSHOPS   Healthy Mindset (Psychosocial):  Focused Goals, Sustainable Changes Clinical staff led group instruction and group discussion with PowerPoint presentation and patient guidebook. To enhance the learning environment the use of posters, models and videos may be added. Patients will be able to apply effective goal setting strategies to establish at least one personal goal, and then take consistent, meaningful action toward that goal. They will learn to identify common barriers to achieving personal goals and develop strategies to overcome them. Patients will also gain an understanding of how our mind-set can impact our ability to achieve goals and the importance of cultivating a positive and growth-oriented mind-set. The purpose of this lesson is to provide patients with a deeper understanding of how to set and achieve personal goals, as well as the tools and strategies needed to overcome common obstacles which may arise along the way.  From Head to Heart: The Power of a Healthy Outlook  Clinical staff led group instruction and group discussion with PowerPoint presentation and patient guidebook. To enhance the learning environment the use of posters, models and videos may be added. Patients will be able to recognize and describe the impact of emotions and mood on physical health. They will discover the importance of self-care and explore self-care practices which may work for them. Patients will also learn how to utilize the 4 C's to cultivate a healthier outlook and  better manage stress and challenges. The purpose of this lesson is to demonstrate  to patients how a healthy outlook is an essential part of maintaining good health, especially as they continue their cardiac rehab journey.  Healthy Sleep for a Healthy Heart Clinical staff led group instruction and group discussion with PowerPoint presentation and patient guidebook. To enhance the learning environment the use of posters, models and videos may be added. At the conclusion of this workshop, patients will be able to demonstrate knowledge of the importance of sleep to overall health, well-being, and quality of life. They will understand the symptoms of, and treatments for, common sleep disorders. Patients will also be able to identify daytime and nighttime behaviors which impact sleep, and they will be able to apply these tools to help manage sleep-related challenges. The purpose of this lesson is to provide patients with a general overview of sleep and outline the importance of quality sleep. Patients will learn about a few of the most common sleep disorders. Patients will also be introduced to the concept of "sleep hygiene," and discover ways to self-manage certain sleeping problems through simple daily behavior changes. Finally, the workshop will motivate patients by clarifying the links between quality sleep and their goals of heart-healthy living.   Recognizing and Reducing Stress Clinical staff led group instruction and group discussion with PowerPoint presentation and patient guidebook. To enhance the learning environment the use of posters, models and videos may be added. At the conclusion of this workshop, patients will be able to understand the types of stress reactions, differentiate between acute and chronic stress, and recognize the impact that chronic stress has on their health. They will also be able to apply different coping mechanisms, such as reframing negative self-talk. Patients will have the opportunity to practice a variety of stress management techniques, such as deep  abdominal breathing, progressive muscle relaxation, and/or guided imagery.  The purpose of this lesson is to educate patients on the role of stress in their lives and to provide healthy techniques for coping with it.  Learning Barriers/Preferences:  Learning Barriers/Preferences - 03/03/24 1204       Learning Barriers/Preferences   Learning Barriers Hearing;Sight;Exercise Concerns   Anne Barnes wears glasses and has a hearing deficit. Anne Barnes says she becomes lightheaded due to issues with allergies.   Learning Preferences Skilled Demonstration             Education Topics:  Knowledge Questionnaire Score:  Knowledge Questionnaire Score - 03/03/24 1402       Knowledge Questionnaire Score   Pre Score 19/24             Core Components/Risk Factors/Patient Goals at Admission:  Personal Goals and Risk Factors at Admission - 03/03/24 1144       Core Components/Risk Factors/Patient Goals on Admission    Weight Management Yes;Obesity;Weight Loss    Intervention Weight Management/Obesity: Establish reasonable short term and long term weight goals.;Obesity: Provide education and appropriate resources to help participant work on and attain dietary goals.    Admit Weight 218 lb 14.7 oz (99.3 kg)    Expected Outcomes Short Term: Continue to assess and modify interventions until short term weight is achieved;Long Term: Adherence to nutrition and physical activity/exercise program aimed toward attainment of established weight goal;Weight Loss: Understanding of general recommendations for a balanced deficit meal plan, which promotes 1-2 lb weight loss per week and includes a negative energy balance of [phone] kcal/d    Hypertension Yes    Intervention Provide education  on lifestyle modifcations including regular physical activity/exercise, weight management, moderate sodium restriction and increased consumption of fresh fruit, vegetables, and low fat dairy, alcohol moderation, and smoking  cessation.;Monitor prescription use compliance.    Expected Outcomes Short Term: Continued assessment and intervention until BP is < 140/50mm HG in hypertensive participants. < 130/64mm HG in hypertensive participants with diabetes, heart failure or chronic kidney disease.;Long Term: Maintenance of blood pressure at goal levels.    Lipids Yes    Intervention Provide education and support for participant on nutrition & aerobic/resistive exercise along with prescribed medications to achieve LDL 70mg , HDL >40mg .    Expected Outcomes Short Term: Participant states understanding of desired cholesterol values and is compliant with medications prescribed. Participant is following exercise prescription and nutrition guidelines.;Long Term: Cholesterol controlled with medications as prescribed, with individualized exercise RX and with personalized nutrition plan. Value goals: LDL < 70mg , HDL > 40 mg.             Core Components/Risk Factors/Patient Goals Review:    Core Components/Risk Factors/Patient Goals at Discharge (Final Review):    ITP Comments:  ITP Comments     Row Name 03/03/24 1059           ITP Comments Dr Gaylyn Keas MD, Medical Director. Introduction to [facility]/ Intensive Cardiac Rehab. Initial Orientation packet Reviewed with the patient.                Comments: Anne Barnes attended orientation for the cardiac rehabilitation program on  03/03/2024  to perform initial intake and exercise walk test. She was introduced to the [employer] education and orientation packet was reviewed. Completed 6-minute walk test, measurements, initial ITP, and exercise prescription. Vital signs stable. Telemetry-normal sinus rhythm, asymptomatic.   Service time was from 936 to 1215. Doree Games, MS, ACSM CEP 03/03/2024 [phone]

## 2024-03-03 NOTE — Progress Notes (Signed)
 Anne Barnes has a large brown discolored area under her left breast. Anne Barnes says that she sweats a lot at night. Currently Anne Barnes denies discomfort or itching. I advised Anne Barnes to follow up with her primary care provider Anne Barnes to look at. Anne Barnes said she will call the office to make an appointment for evaluation.Anne Antonio RN BSN

## 2024-03-04 DIAGNOSIS — H9313 Tinnitus, bilateral: Secondary | ICD-10-CM | POA: Diagnosis not present

## 2024-03-04 DIAGNOSIS — H903 Sensorineural hearing loss, bilateral: Secondary | ICD-10-CM | POA: Diagnosis not present

## 2024-03-09 ENCOUNTER — Encounter (HOSPITAL_COMMUNITY)
Admission: RE | Admit: 2024-03-09 | Discharge: 2024-03-09 | Disposition: A | Discharge status: Home / Self Care | Source: Ambulatory Visit | Attending: Internal Medicine

## 2024-03-09 DIAGNOSIS — I2111 ST elevation (STEMI) myocardial infarction involving right coronary artery: Secondary | ICD-10-CM

## 2024-03-09 DIAGNOSIS — Z955 Presence of coronary angioplasty implant and graft: Secondary | ICD-10-CM

## 2024-03-09 DIAGNOSIS — L819 Disorder of pigmentation, unspecified: Secondary | ICD-10-CM | POA: Diagnosis not present

## 2024-03-09 DIAGNOSIS — Z48812 Encounter for surgical aftercare following surgery on the circulatory system: Secondary | ICD-10-CM | POA: Diagnosis not present

## 2024-03-09 DIAGNOSIS — I252 Old myocardial infarction: Secondary | ICD-10-CM | POA: Diagnosis not present

## 2024-03-09 NOTE — Progress Notes (Signed)
 Daily Session Note  Patient Details  Name: Anne Barnes MRN: [id number] Date of Birth: Aug 23, 1946 Referring Provider:   Flowsheet Row INTENSIVE CARDIAC REHAB ORIENT from 03/03/2024 in [facility]  Referring Provider Anne Melody, MD    Encounter Date: 03/09/2024  Check In:  Session Check In - 03/09/24 1053       Check-In   Supervising physician immediately available to respond to emergencies [facility] MD immediately available    Physician(s) Anne Ar, NP    Location [facility]    Staff Present Anne Mu, RN, Anne Sauger, MS, ACSM-CEP, Exercise Physiologist;Anne Vernel Golds, MS, ACSM-CEP, CCRP, Exercise Physiologist;Anne Barnes BS, ACSM-CEP, Exercise Physiologist;Anne Alexia Angelucci, MS, Exercise Physiologist;Anne Barnes BS, ACSM-CEP, Exercise Physiologist    Virtual Visit No    Medication changes reported     No    Fall or balance concerns reported    No    Tobacco Cessation No Change    Current number of cigarettes/nicotine per day     --    Warm-up and Cool-down Performed as group-led instruction    Resistance Training Performed Yes    VAD Patient? No    PAD/SET Patient? No      Pain Assessment   Currently in Pain? No/denies    Pain Score 0-No pain    Multiple Pain Sites No          Capillary Blood Glucose: No results found for this or any previous visit (from the past 24 hours).   Exercise Prescription Changes - 03/09/24 1037       Response to Exercise   Blood Pressure (Admit) 140/78    Blood Pressure (Exercise) 156/70    Blood Pressure (Exit) 124/70    Heart Rate (Admit) 75 bpm    Heart Rate (Exercise) 104 bpm    Heart Rate (Exit) 84 bpm    Rating of Perceived Exertion (Exercise) 9    Symptoms None    Comments Off to a good start with exercise.    Duration Continue with 30 min of aerobic exercise without signs/symptoms of physical distress.    Intensity THRR  unchanged      Progression   Progression Continue to progress workloads to maintain intensity without signs/symptoms of physical distress.    Average METs 1.7      Resistance Training   Training Prescription Yes    Weight 2 lbs    Reps 10-15    Time 5 Minutes      Interval Training   Interval Training No      NuStep   Level 2    SPM 78    Minutes 30    METs 1.7          Social History   Tobacco Use  Smoking Status Never  Smokeless Tobacco Never  Tobacco Comments   dtr 2 g-kids.    Goals Met:  Exercise tolerated well No report of concerns or symptoms today Strength training completed today  Goals Unmet:  Not Applicable  Comments: [name] completed first session of cardiac rehab well. She tolerated 30 minutes on the recumbent stepper without symptoms. Pre-exercise blood pressure was mildly elevated but within normal limits at exercise and recovery. Telemetry- normal sinus rhythm.   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

## 2024-03-10 NOTE — Progress Notes (Signed)
 Cardiac Individual Treatment Plan  Patient Details  Name: Anne Barnes MRN: [id number] Date of Birth: [birth year] Referring Provider:   Flowsheet Row INTENSIVE CARDIAC REHAB ORIENT from 03/03/2024 in [facility]  Referring Provider Jann Melody, MD    Initial Encounter Date:  Flowsheet Row INTENSIVE CARDIAC REHAB ORIENT from 03/03/2024 in [facility]  Date 03/03/24    Visit Diagnosis: 01/30/24 STEMI  01/30/24 S/P PCI/DES RCA  Patient's Home Medications on Admission:  Current Outpatient Medications:    albuterol  (VENTOLIN  HFA) 108 (90 Base) MCG/ACT inhaler, INHALE 2 PUFFS BY MOUTH EVERY 6 HOURS AS NEEDED FOR WHEEZING FOR SHORTNESS OF BREATH (Patient taking differently: Inhale 2 puffs into the lungs every 6 (six) hours as needed for wheezing or shortness of breath. INHALE 2 PUFFS BY MOUTH EVERY 6 HOURS AS NEEDED FOR WHEEZING FOR SHORTNESS OF BREATH), Disp: 18 g, Rfl: 12   alendronate  (FOSAMAX ) 70 MG tablet, Take 1 tablet (70 mg total) by mouth once a week. Take with a full glass of water on an empty stomach. (Patient taking differently: Take 70 mg by mouth once a week. Take with a full glass of water on an empty stomach. On Saturdays), Disp: 4 tablet, Rfl: 5   ALPRAZolam  (XANAX ) 0.5 MG tablet, TAKE 1 TABLET BY MOUTH TWICE DAILY AS NEEDED FOR ANXIETY AND FOR SLEEP (Patient taking differently: Take 0.25 mg by mouth 2 (two) times daily as needed for anxiety or sleep.), Disp: 20 tablet, Rfl: 0   amLODipine  (NORVASC ) 2.5 MG tablet, Take 1 tablet (2.5 mg total) by mouth daily., Disp: 30 tablet, Rfl: 3   aspirin  EC 81 MG tablet, Take 1 tablet (81 mg total) by mouth daily. Swallow whole., Disp: 30 tablet, Rfl: 3   azelastine  (ASTELIN ) 0.1 % nasal spray, Place 2 sprays into both nostrils 2 (two) times daily as needed for rhinitis., Disp: 30 mL, Rfl: 5   azelastine  (OPTIVAR )  0.05 % ophthalmic solution, Place 1 drop into both eyes 2 (two) times daily as needed., Disp: 6 mL, Rfl: 5   b complex vitamins tablet, Take 1 tablet by mouth daily., Disp: , Rfl:    Cholecalciferol (VITAMIN D ) 50 MCG (2000 UT) tablet, Take 2,000 Units by mouth daily., Disp: , Rfl:    clopidogrel  (PLAVIX ) 75 MG tablet, Take 1 tablet (75 mg total) by mouth daily with breakfast., Disp: 90 tablet, Rfl: 3   empagliflozin  (JARDIANCE ) 10 MG TABS tablet, Take 1 tablet (10 mg total) by mouth daily before breakfast., Disp: 90 tablet, Rfl: 3   EPINEPHrine  0.3 mg/0.3 mL IJ SOAJ injection, Inject 0.3 mg into the muscle as needed for anaphylaxis., Disp: 2 each, Rfl: 2   fluticasone  (FLONASE ) 50 MCG/ACT nasal spray, Use 1-2 sprays in each nostril once  a day as needed for stuffy nose. In the right nostril, point the applicator out toward the right ear. In the left nostril, point the applicator out toward the left ear, Disp: 16 g, Rfl: 5   Fluticasone  Propionate (XHANCE  NA), Place 2 sprays into the nose 2 (two) times daily as needed., Disp: , Rfl:    furosemide  (LASIX ) 20 MG tablet, Take 1 tablet (20 mg total) by mouth 3 (three) times a week. Monday, Wednesday and Friday., Disp: 90 tablet, Rfl: 3   levocetirizine (XYZAL ) 5 MG tablet, TAKE 1 TABLET BY MOUTH ONCE DAILY IN THE EVENING, Disp: 30 tablet, Rfl:  5   MAGNESIUM  CITRATE PO, Take 1 tablet by mouth daily., Disp: , Rfl:    montelukast  (SINGULAIR ) 10 MG tablet, TAKE 1 TABLET BY MOUTH AT BEDTIME, Disp: 30 tablet, Rfl: 5   Multiple Vitamin (MULTIVITAMIN) tablet, Take 1 tablet by mouth daily., Disp: , Rfl:    nitroGLYCERIN  (NITROSTAT ) 0.4 MG SL tablet, Place 1 tablet (0.4 mg total) under the tongue every 5 (five) minutes as needed for chest pain., Disp: 25 tablet, Rfl: 1   pantoprazole  (PROTONIX ) 40 MG tablet, Take 1 tablet (40 mg total) by mouth daily., Disp: 90 tablet, Rfl: 3   Pseudoephedrine-Ibuprofen (ADVIL COLD/SINUS PO), Take 2 tablets by mouth as needed.,  Disp: , Rfl:    rosuvastatin  (CRESTOR ) 20 MG tablet, Take 1 tablet (20 mg total) by mouth daily., Disp: 90 tablet, Rfl: 3   spironolactone  (ALDACTONE ) 25 MG tablet, Take 1 tablet (25 mg total) by mouth daily., Disp: 90 tablet, Rfl: 2   UNABLE TO FIND, Take 2 capsules by mouth daily. Fish Oil w/ tumeric, Disp: , Rfl:    valsartan  (DIOVAN ) 40 MG tablet, Take 1 tablet (40 mg total) by mouth daily., Disp: 30 tablet, Rfl: 5  Past Medical History: Past Medical History:  Diagnosis Date   Acute combined systolic and diastolic heart failure (HCC) 05/26/2015   ALLERGIC RHINITIS    Allergic urticaria 08/01/2014   Patient reports allergies to shrimp, egg whites, tomatoes, dairy    Allergy     seasonal and numerous food and drug allergies.   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   Office Spirometry 11/07/17-WNL-FVC 1.58/82%, FEV1 1.27/85%, ratio 0.80, FEF 25-75% 0.26/89%   Bradycardia 02/25/2017   CAD (coronary artery disease) 05/01/2023   Myoview  03/26/2017: No ischemia Non-obs dz - CCTA 10/31/2019: CAC score 127; RCA proximal and mid 25-49, LAD proximal and mid 0-25    Chiari malformation    Noted MRI brain 09/2013 - s/p neuro eval for same   DIVERTICULOSIS, COLON    Essential hypertension 08/28/2010   Qualifier: Diagnosis of  By: Georganne Kind RMA, Lucy      Frequent headaches    GERD    H/O measles    H/O mumps    Hearing loss 01/18/2017   History of chicken pox    Hyperglycemia 03/05/2016   Hyperlipidemia    HYPERTENSION    Hypocalcemia 02/25/2017   IBS (irritable bowel syndrome) 05/26/2015   Nonspecific abnormal electrocardiogram (ECG) (EKG)    OBSTRUCTIVE SLEEP APNEA 12/2008 dx   noncompliant with CPAP qhs   Obstructive sleep apnea 08/28/2010   NPSG 12/2008:  AHI 13/hr with desats to 78%    Preventative health care 06/11/2017   Sciatica    right side   Seasonal and perennial allergic rhinitis 08/28/2010   Allergy  profile 03/11/14- positive especially for dust, cat and dog Food allergy  profile-  total IgE 86 with several food group elevations Sed rate-WNL      Sinusitis 06/06/2017   Sleep apnea    uses cpap   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    Labs: Review Flowsheet  More data exists      Latest Ref Rng & Units 03/05/2023 07/25/2023 01/23/2024 01/30/2024 01/31/2024  Labs for ITP Cardiac and Pulmonary Rehab  Cholestrol 0 - 200 mg/dL 161  096  045  -  122   LDL (calc) 0 - 99 mg/dL 60  75  865  - 72   HDL-C >40 mg/dL 78.46  96.29  52.84  - 42   Trlycerides <150 mg/dL 13.2  44.0  10.2  - 40   Hemoglobin A1c 4.8 - 5.6 % 5.6  5.4  5.6  - 5.1   TCO2 22 - 32 mmol/L - - - 27  -    Capillary Blood Glucose: Lab Results  Component Value Date   GLUCAP 117 (H) 03/03/2024   GLUCAP 136 (H) 01/31/2024   GLUCAP 98 01/31/2024   GLUCAP 111 (H) 01/31/2024   GLUCAP 96 01/30/2024     Exercise Target Goals: Exercise Program Goal: Individual exercise prescription set using results from initial 6 min walk test and THRR while considering  patient's activity barriers and safety.   Exercise Prescription Goal: Initial exercise prescription builds to 30-45 minutes a day of aerobic activity, 2-3 days per week.  Home exercise guidelines will be given to patient during program as part of exercise prescription that the participant will acknowledge.  Activity Barriers & Risk Stratification:  Activity Barriers & Cardiac Risk Stratification - 03/03/24 1144       Activity Barriers & Cardiac Risk Stratification   Activity Barriers Balance Concerns;History of Falls;Assistive Device;Other (comment)    Comments Possible neuropathy in legs/ feet.    Cardiac Risk Stratification High          6 Minute Walk:  6 Minute Walk     Row Name 03/03/24 1200         6 Minute Walk   Phase Initial     Distance 720 feet      Walk Time 6 minutes     # of Rest Breaks 0     MPH 1.36     METS 0.91     RPE 13     Perceived Dyspnea  2     VO2 Peak 3.18     Symptoms Yes (comment)     Comments Mild shortness of breath. Left leg and foot weakness.     Resting HR 75 bpm     Resting BP 134/78     Resting Oxygen Saturation  97 %     Exercise Oxygen Saturation  during 6 min walk 96 %     Max Ex. HR 102 bpm     Max Ex. BP 148/68     2 Minute Post BP 150/72        Oxygen Initial Assessment:   Oxygen Re-Evaluation:   Oxygen Discharge (Final Oxygen Re-Evaluation):   Initial Exercise Prescription:  Initial Exercise Prescription - 03/03/24 1300       Date of Initial Exercise RX and Referring Provider   Date 03/03/24    Referring Provider Jann Melody, MD    Expected Discharge Date 05/27/24      NuStep   Level 1    SPM 75    Minutes 15    METs 1.5      Prescription Details   Frequency (times per week) 3    Duration Progress to 30 minutes of continuous aerobic without signs/symptoms of physical distress      Intensity   THRR 40-80% of Max Heartrate 58-115    Ratings of Perceived Exertion 11-13    Perceived Dyspnea 0-4      Progression   Progression Continue to progress workloads to maintain intensity without signs/symptoms of physical distress.      Resistance  Training   Training Prescription Yes    Weight 2 lbs    Reps 10-15          Perform Capillary Blood Glucose checks as needed.  Exercise Prescription Changes:   Exercise Prescription Changes     Row Name 03/09/24 1037             Response to Exercise   Blood Pressure (Admit) 140/78       Blood Pressure (Exercise) 156/70       Blood Pressure (Exit) 124/70       Heart Rate (Admit) 75 bpm       Heart Rate (Exercise) 104 bpm       Heart Rate (Exit) 84 bpm       Rating of Perceived Exertion (Exercise) 9       Symptoms None       Comments Off to a good start with exercise.       Duration Continue with 30 min  of aerobic exercise without signs/symptoms of physical distress.       Intensity THRR unchanged         Progression   Progression Continue to progress workloads to maintain intensity without signs/symptoms of physical distress.       Average METs 1.7         Resistance Training   Training Prescription Yes       Weight 2 lbs       Reps 10-15       Time 5 Minutes         Interval Training   Interval Training No         NuStep   Level 2       SPM 78       Minutes 30       METs 1.7          Exercise Comments:   Exercise Comments     Row Name 03/09/24 1135           Exercise Comments Tanasia tolerated low intensity exercise well without symptoms. Oriented her to the exercise equipment and stretching routine.          Exercise Goals and Review:   Exercise Goals     Row Name 03/03/24 1144             Exercise Goals   Increase Physical Activity Yes       Intervention Provide advice, education, support and counseling about physical activity/exercise needs.;Develop an individualized exercise prescription for aerobic and resistive training based on initial evaluation findings, risk stratification, comorbidities and participant's personal goals.       Expected Outcomes Short Term: Attend rehab on a regular basis to increase amount of physical activity.;Long Term: Exercising regularly at least 3-5 days a week.;Long Term: Add in home exercise to make exercise part of routine and to increase amount of physical activity.       Increase Strength and Stamina Yes       Intervention Provide advice, education, support and counseling about physical activity/exercise needs.;Develop an individualized exercise prescription for aerobic and resistive training based on initial evaluation findings, risk stratification, comorbidities and participant's personal goals.       Expected Outcomes Short Term: Increase workloads from initial exercise prescription for resistance, speed, and METs.;Short  Term: Perform resistance training exercises routinely during rehab and add in resistance training at home;Long Term: Improve cardiorespiratory fitness, muscular endurance and strength as measured by increased METs and functional capacity ( )  Able to understand and use rate of perceived exertion (RPE) scale Yes       Intervention Provide education and explanation on how to use RPE scale       Expected Outcomes Short Term: Able to use RPE daily in rehab to express subjective intensity level;Long Term:  Able to use RPE to guide intensity level when exercising independently       Knowledge and understanding of Target Heart Rate Range (THRR) Yes       Intervention Provide education and explanation of THRR including how the numbers were predicted and where they are located for reference       Expected Outcomes Short Term: Able to state/look up THRR;Long Term: Able to use THRR to govern intensity when exercising independently;Short Term: Able to use daily as guideline for intensity in rehab       Able to check pulse independently Yes       Intervention Provide education and demonstration on how to check pulse in carotid and radial arteries.;Review the importance of being able to check your own pulse for safety during independent exercise       Expected Outcomes Short Term: Able to explain why pulse checking is important during independent exercise;Long Term: Able to check pulse independently and accurately       Understanding of Exercise Prescription Yes       Intervention Provide education, explanation, and written materials on patient's individual exercise prescription       Expected Outcomes Short Term: Able to explain program exercise prescription;Long Term: Able to explain home exercise prescription to exercise independently          Exercise Goals Re-Evaluation :  Exercise Goals Re-Evaluation     Row Name 03/09/24 1135             Exercise Goal Re-Evaluation   Exercise Goals Review  Increase Physical Activity;Increase Strength and Stamina;Able to understand and use rate of perceived exertion (RPE) scale       Comments Andria was able to understand and use RPE scale appropriately.       Expected Outcomes Progress workloads as tolerated to help improve cardiorespiratory fitness.          Discharge Exercise Prescription (Final Exercise Prescription Changes):  Exercise Prescription Changes - 03/09/24 1037       Response to Exercise   Blood Pressure (Admit) 140/78    Blood Pressure (Exercise) 156/70    Blood Pressure (Exit) 124/70    Heart Rate (Admit) 75 bpm    Heart Rate (Exercise) 104 bpm    Heart Rate (Exit) 84 bpm    Rating of Perceived Exertion (Exercise) 9    Symptoms None    Comments Off to a good start with exercise.    Duration Continue with 30 min of aerobic exercise without signs/symptoms of physical distress.    Intensity THRR unchanged      Progression   Progression Continue to progress workloads to maintain intensity without signs/symptoms of physical distress.    Average METs 1.7      Resistance Training   Training Prescription Yes    Weight 2 lbs    Reps 10-15    Time 5 Minutes      Interval Training   Interval Training No      NuStep   Level 2    SPM 78    Minutes 30    METs 1.7          Nutrition:  Target Goals:  Understanding of nutrition guidelines, daily intake of sodium 1500mg , cholesterol 200mg , calories 30% from fat and 7% or less from saturated fats, daily to have 5 or more servings of fruits and vegetables.  Biometrics:  Pre Biometrics - 03/03/24 1058       Pre Biometrics   Waist Circumference 51.25 inches    Hip Circumference 53.25 inches    Waist to Hip Ratio 0.96 %    Triceps Skinfold 52 mm    % Body Fat 57.3 %    Grip Strength 8 kg    Flexibility 0 in    Single Leg Stand 2.87 seconds           Nutrition Therapy Plan and Nutrition Goals:  Nutrition Therapy & Goals - 03/09/24 1103       Nutrition  Therapy   Diet Heart healthy Diet    Drug/Food Interactions Statins/Certain Fruits      Personal Nutrition Goals   Nutrition Goal Patient to identify strategies for reducing cardiovascular risk by attending the [employer] education and nutrition series weekly.    Personal Goal #2 Patient to improve diet quality by using the plate method as a guide for meal planning to include lean protein/plant protein, fruits, vegetables, whole grains, nonfat dairy as part of a well-balanced diet.    Comments Patient has medical history of STEMI, HFpEF, hyperlipidemia. LDL is 72 with ideal goal <40. 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 Patient will benefit from participation in intensive cardiac rehab for nutrition, exercise, and lifestyle modification.      Intervention Plan   Intervention Prescribe, educate and counsel regarding individualized specific dietary modifications aiming towards targeted core components such as weight, hypertension, lipid management, diabetes, heart failure and other comorbidities.;Nutrition handout(s) given to patient.    Expected Outcomes Short Term Goal: Understand basic principles of dietary content, such as calories, fat, sodium, cholesterol and nutrients.;Long Term Goal: Adherence to prescribed nutrition plan.          Nutrition Assessments:  Nutrition Assessments - 03/09/24 1147       Rate Your Plate Scores   Pre Score 73         MEDIFICTS Score Key: >=70 Need to make dietary changes  40-70 Heart Healthy Diet <= 40 Therapeutic Level Cholesterol Diet   Flowsheet Row INTENSIVE CARDIAC REHAB from 03/09/2024 in [facility]  Picture Your Plate Total Score on Admission 73   Picture Your Plate Scores: <98 Unhealthy dietary pattern with much room for improvement. 41-50 Dietary pattern unlikely to meet recommendations  for good health and room for improvement. 51-60 More healthful dietary pattern, with some room for improvement.  >60 Healthy dietary pattern, although there may be some specific behaviors that could be improved.    Nutrition Goals Re-Evaluation:  Nutrition Goals Re-Evaluation     Row Name 03/09/24 1103             Goals   Current Weight 218 lb 14.7 oz (99.3 kg)       Comment A1c WNL, LDL 72 (goals <70 but ideally <55), HDL 42       Expected Outcome Patient has medical history of STEMI, HFpEF, hyperlipidemia. LDL is 72 with ideal goal <11. 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 Patient will benefit from participation in intensive cardiac rehab for nutrition, exercise,  and lifestyle modification.          Nutrition Goals Re-Evaluation:  Nutrition Goals Re-Evaluation     Row Name 03/09/24 1103             Goals   Current Weight 218 lb 14.7 oz (99.3 kg)       Comment A1c WNL, LDL 72 (goals <70 but ideally <55), HDL 42       Expected Outcome Patient has medical history of STEMI, HFpEF, hyperlipidemia. LDL is 72 with ideal goal <40. 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 Patient will benefit from participation in intensive cardiac rehab for nutrition, exercise, and lifestyle modification.          Nutrition Goals Discharge (Final Nutrition Goals Re-Evaluation):  Nutrition Goals Re-Evaluation - 03/09/24 1103       Goals   Current Weight 218 lb 14.7 oz (99.3 kg)    Comment A1c WNL, LDL 72 (goals <70 but ideally <55), HDL 42    Expected Outcome Patient has medical history of STEMI, HFpEF, hyperlipidemia. LDL is 72 with ideal goal <98. 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 Patient will benefit from  participation in intensive cardiac rehab for nutrition, exercise, and lifestyle modification.          Psychosocial: Target Goals: Acknowledge presence or absence of significant depression and/or stress, maximize coping skills, provide positive support system. Participant is able to verbalize types and ability to use techniques and skills needed for reducing stress and depression.  Initial Review & Psychosocial Screening:  Initial Psych Review & Screening - 03/03/24 1100       Initial Review   Current issues with History of Depression      Family Dynamics   Good Support System? Yes   Komal live with her daughter and her 2 sons   Comments Kayleann lost her husband 4 years ago and her daughter 2 years ago from ALS she was 38 years old      Barriers   Psychosocial barriers to participate in program The patient should benefit from training in stress management and relaxation.      Screening Interventions   Interventions Encouraged to exercise;To provide support and resources with identified psychosocial needs;Provide feedback about the scores to participant    Expected Outcomes Short Term goal: Utilizing psychosocial counselor, staff and physician to assist with identification of specific Stressors or current issues interfering with healing process. Setting desired goal for each stressor or current issue identified.;Long Term Goal: Stressors or current issues are controlled or eliminated.;Short Term goal: Identification and review with participant of any Quality of Life or Depression concerns found by scoring the questionnaire.;Long Term goal: The participant improves quality of Life and PHQ9 Scores as seen by post scores and/or verbalization of changes          Quality of Life Scores:  Quality of Life - 03/03/24 1402       Quality of Life   Select Quality of Life      Quality of Life Scores   Health/Function Pre 25.03 %    Socioeconomic Pre 26.29 %    Psych/Spiritual Pre 28.21 %     Family Pre 30 %    GLOBAL Pre 26.58 %         Scores of 19 and below usually indicate a poorer quality of life in these areas.  A difference of  2-3 points is a clinically meaningful difference.  A difference of 2-3 points in the total score of the Quality of Life Index has been associated with significant improvement in overall quality of life, self-image, physical symptoms, and general health in studies assessing change in quality of life.  PHQ-9: Review Flowsheet  More data exists      03/03/2024 02/05/2024 07/25/2023 03/05/2023 10/23/2022  Depression screen PHQ 2/9  Decreased Interest 0 0 0 0 0  Down, Depressed, Hopeless 0 0 0 0 0  PHQ - 2 Score 0 0 0 0 0  Altered sleeping 2 - - 0 -  Tired, decreased energy 2 - - 0 -  Change in appetite 0 - - 0 -  Feeling bad or failure about yourself  0 - - 0 -  Trouble concentrating 0 - - 0 -  Moving slowly or fidgety/restless 0 - - 0 -  Suicidal thoughts 0 - - 0 -  PHQ-9 Score 4 - - 0 -  Difficult doing work/chores Somewhat difficult - - Not difficult at all -   Interpretation of Total Score  Total Score Depression Severity:  1-4 = Minimal depression, 5-9 = Mild depression, 10-14 = Moderate depression, 15-19 = Moderately severe depression, 20-27 = Severe depression   Psychosocial Evaluation and Intervention:   Psychosocial Re-Evaluation:  Psychosocial Re-Evaluation     Row Name 03/09/24 1741             Psychosocial Re-Evaluation   Current issues with History of Depression;Current Sleep Concerns       Comments Azyriah did not voice any increased concerns or stressors. Cici did says she has trouble sleeping at orientation and sleeps in her recliner.       Interventions Stress management education;Encouraged to attend Cardiac Rehabilitation for the exercise;Relaxation education       Continue Psychosocial Services  Follow up required by staff          Psychosocial Discharge (Final Psychosocial Re-Evaluation):  Psychosocial  Re-Evaluation - 03/09/24 1741       Psychosocial Re-Evaluation   Current issues with History of Depression;Current Sleep Concerns    Comments Crissie did not voice any increased concerns or stressors. Darely did says she has trouble sleeping at orientation and sleeps in her recliner.    Interventions Stress management education;Encouraged to attend Cardiac Rehabilitation for the exercise;Relaxation education    Continue Psychosocial Services  Follow up required by staff          Vocational Rehabilitation: Provide vocational rehab assistance to qualifying candidates.   Vocational Rehab Evaluation & Intervention:  Vocational Rehab - 03/03/24 1206       Initial Vocational Rehab Evaluation & Intervention   Assessment shows need for Vocational Rehabilitation No   Rakisha is retired and does not need vocational rehab at this time         Education: Education Goals: Education classes will be provided on a weekly basis, covering required topics. Participant will state understanding/return demonstration of topics presented.    Education     Row Name 03/09/24 1400     Education   Cardiac Education Topics Pritikin   [facility]     Workshops   Educator Exercise Physiologist   [facility]   Psychosocial Workshop Focused Goals, Sustainable Changes   Instruction Review Code 1- Verbalizes Understanding   Class Start Time 1155   Class Stop Time 1235   Class Time Calculation (min) 40 min  Core Videos: Exercise    Move It!  Clinical staff conducted group or individual video education with verbal and written material and guidebook.  Patient learns the recommended Pritikin exercise program. Exercise with the goal of living a long, healthy life. Some of the health benefits of exercise include controlled diabetes, healthier blood pressure levels, improved cholesterol levels, improved heart and lung capacity, improved sleep, and better body composition. Everyone should  speak with their doctor before starting or changing an exercise routine.  Biomechanical Limitations Clinical staff conducted group or individual video education with verbal and written material and guidebook.  Patient learns how biomechanical limitations can impact exercise and how we can mitigate and possibly overcome limitations to have an impactful and balanced exercise routine.  Body Composition Clinical staff conducted group or individual video education with verbal and written material and guidebook.  Patient learns that body composition (ratio of muscle mass to fat mass) is a key component to assessing overall fitness, rather than body weight alone. Increased fat mass, especially visceral belly fat, can put us  at increased risk for metabolic syndrome, type 2 diabetes, heart disease, and even death. It is recommended to combine diet and exercise (cardiovascular and resistance training) to improve your body composition. Seek guidance from your physician and exercise physiologist before implementing an exercise routine.  Exercise Action Plan Clinical staff conducted group or individual video education with verbal and written material and guidebook.  Patient learns the recommended strategies to achieve and enjoy long-term exercise adherence, including variety, self-motivation, self-efficacy, and positive decision making. Benefits of exercise include fitness, good health, weight management, more energy, better sleep, less stress, and overall well-being.  Medical   Heart Disease Risk Reduction Clinical staff conducted group or individual video education with verbal and written material and guidebook.  Patient learns our heart is our most vital organ as it circulates oxygen, nutrients, white blood cells, and hormones throughout the entire body, and carries waste away. Data supports a plant-based eating plan like the Pritikin Program for its effectiveness in slowing progression of and reversing heart  disease. The video provides a number of recommendations to address heart disease.   Metabolic Syndrome and Belly Fat  Clinical staff conducted group or individual video education with verbal and written material and guidebook.  Patient learns what metabolic syndrome is, how it leads to heart disease, and how one can reverse it and keep it from coming back. You have metabolic syndrome if you have 3 of the following 5 criteria: abdominal obesity, high blood pressure, high triglycerides, low HDL cholesterol, and high blood sugar.  Hypertension and Heart Disease Clinical staff conducted group or individual video education with verbal and written material and guidebook.  Patient learns that high blood pressure, or hypertension, is very common in the [place] . Hypertension is largely due to excessive salt intake, but other important risk factors include being overweight, physical inactivity, drinking too much alcohol, smoking, and not eating enough potassium from fruits and vegetables. High blood pressure is a leading risk factor for heart attack, stroke, congestive heart failure, dementia, kidney failure, and premature death. Long-term effects of excessive salt intake include stiffening of the arteries and thickening of heart muscle and organ damage. Recommendations include ways to reduce hypertension and the risk of heart disease.  Diseases of Our Time - Focusing on Diabetes Clinical staff conducted group or individual video education with verbal and written material and guidebook.  Patient learns why the best way to stop diseases of our time  is prevention, through food and other lifestyle changes. Medicine (such as prescription pills and surgeries) is often only a Band-Aid on the problem, not a long-term solution. Most common diseases of our time include obesity, type 2 diabetes, hypertension, heart disease, and cancer. The Pritikin Program is recommended and has been proven to help reduce, reverse,  and/or prevent the damaging effects of metabolic syndrome.  Nutrition   Overview of the Pritikin Eating Plan  Clinical staff conducted group or individual video education with verbal and written material and guidebook.  Patient learns about the Pritikin Eating Plan for disease risk reduction. The Pritikin Eating Plan emphasizes a wide variety of unrefined, minimally-processed carbohydrates, like fruits, vegetables, whole grains, and legumes. Go, Caution, and Stop food choices are explained. Plant-based and lean animal proteins are emphasized. Rationale provided for low sodium intake for blood pressure control, low added sugars for blood sugar stabilization, and low added fats and oils for coronary artery disease risk reduction and weight management.  Calorie Density  Clinical staff conducted group or individual video education with verbal and written material and guidebook.  Patient learns about calorie density and how it impacts the Pritikin Eating Plan. Knowing the characteristics of the food you choose will help you decide whether those foods will lead to weight gain or weight loss, and whether you want to consume more or less of them. Weight loss is usually a side effect of the Pritikin Eating Plan because of its focus on low calorie-dense foods.  Label Reading  Clinical staff conducted group or individual video education with verbal and written material and guidebook.  Patient learns about the Pritikin recommended label reading guidelines and corresponding recommendations regarding calorie density, added sugars, sodium content, and whole grains.  Dining Out - Part 1  Clinical staff conducted group or individual video education with verbal and written material and guidebook.  Patient learns that restaurant meals can be sabotaging because they can be so high in calories, fat, sodium, and/or sugar. Patient learns recommended strategies on how to positively address this and avoid unhealthy  pitfalls.  Facts on Fats  Clinical staff conducted group or individual video education with verbal and written material and guidebook.  Patient learns that lifestyle modifications can be just as effective, if not more so, as many medications for lowering your risk of heart disease. A Pritikin lifestyle can help to reduce your risk of inflammation and atherosclerosis (cholesterol build-up, or plaque, in the artery walls). Lifestyle interventions such as dietary choices and physical activity address the cause of atherosclerosis. A review of the types of fats and their impact on blood cholesterol levels, along with dietary recommendations to reduce fat intake is also included.  Nutrition Action Plan  Clinical staff conducted group or individual video education with verbal and written material and guidebook.  Patient learns how to incorporate Pritikin recommendations into their lifestyle. Recommendations include planning and keeping personal health goals in mind as an important part of their success.  Healthy Mind-Set    Healthy Minds, Bodies, Hearts  Clinical staff conducted group or individual video education with verbal and written material and guidebook.  Patient learns how to identify when they are stressed. Video will discuss the impact of that stress, as well as the many benefits of stress management. Patient will also be introduced to stress management techniques. The way we think, act, and feel has an impact on our hearts.  How Our Thoughts Can Heal Our Hearts  Clinical staff conducted group or individual video  education with verbal and written material and guidebook.  Patient learns that negative thoughts can cause depression and anxiety. This can result in negative lifestyle behavior and serious health problems. Cognitive behavioral therapy is an effective method to help control our thoughts in order to change and improve our emotional outlook.  Additional Videos:  Exercise    Improving  Performance  Clinical staff conducted group or individual video education with verbal and written material and guidebook.  Patient learns to use a non-linear approach by alternating intensity levels and lengths of time spent exercising to help burn more calories and lose more body fat. Cardiovascular exercise helps improve heart health, metabolism, hormonal balance, blood sugar control, and recovery from fatigue. Resistance training improves strength, endurance, balance, coordination, reaction time, metabolism, and muscle mass. Flexibility exercise improves circulation, posture, and balance. Seek guidance from your physician and exercise physiologist before implementing an exercise routine and learn your capabilities and proper form for all exercise.  Introduction to Yoga  Clinical staff conducted group or individual video education with verbal and written material and guidebook.  Patient learns about yoga, a discipline of the coming together of mind, breath, and body. The benefits of yoga include improved flexibility, improved range of motion, better posture and core strength, increased lung function, weight loss, and positive self-image. Yoga's heart health benefits include lowered blood pressure, healthier heart rate, decreased cholesterol and triglyceride levels, improved immune function, and reduced stress. Seek guidance from your physician and exercise physiologist before implementing an exercise routine and learn your capabilities and proper form for all exercise.  Medical   Aging: Enhancing Your Quality of Life  Clinical staff conducted group or individual video education with verbal and written material and guidebook.  Patient learns key strategies and recommendations to stay in good physical health and enhance quality of life, such as prevention strategies, having an advocate, securing a Health Care Proxy and Power of Attorney, and keeping a list of medications and system for tracking them. It  also discusses how to avoid risk for bone loss.  Biology of Weight Control  Clinical staff conducted group or individual video education with verbal and written material and guidebook.  Patient learns that weight gain occurs because we consume more calories than we burn (eating more, moving less). Even if your body weight is normal, you may have higher ratios of fat compared to muscle mass. Too much body fat puts you at increased risk for cardiovascular disease, heart attack, stroke, type 2 diabetes, and obesity-related cancers. In addition to exercise, following the Pritikin Eating Plan can help reduce your risk.  Decoding Lab Results  Clinical staff conducted group or individual video education with verbal and written material and guidebook.  Patient learns that lab test reflects one measurement whose values change over time and are influenced by many factors, including medication, stress, sleep, exercise, food, hydration, pre-existing medical conditions, and more. It is recommended to use the knowledge from this video to become more involved with your lab results and evaluate your numbers to speak with your doctor.   Diseases of Our Time - Overview  Clinical staff conducted group or individual video education with verbal and written material and guidebook.  Patient learns that according to the CDC, 50% to 70% of chronic diseases (such as obesity, type 2 diabetes, elevated lipids, hypertension, and heart disease) are avoidable through lifestyle improvements including healthier food choices, listening to satiety cues, and increased physical activity.  Sleep Disorders Clinical staff conducted group or individual  video education with verbal and written material and guidebook.  Patient learns how good quality and duration of sleep are important to overall health and well-being. Patient also learns about sleep disorders and how they impact health along with recommendations to address them, including  discussing with a physician.  Nutrition  Dining Out - Part 2 Clinical staff conducted group or individual video education with verbal and written material and guidebook.  Patient learns how to plan ahead and communicate in order to maximize their dining experience in a healthy and nutritious manner. Included are recommended food choices based on the type of restaurant the patient is visiting.   Fueling a Banker conducted group or individual video education with verbal and written material and guidebook.  There is a strong connection between our food choices and our health. Diseases like obesity and type 2 diabetes are very prevalent and are in large-part due to lifestyle choices. The Pritikin Eating Plan provides plenty of food and hunger-curbing satisfaction. It is easy to follow, affordable, and helps reduce health risks.  Menu Workshop  Clinical staff conducted group or individual video education with verbal and written material and guidebook.  Patient learns that restaurant meals can sabotage health goals because they are often packed with calories, fat, sodium, and sugar. Recommendations include strategies to plan ahead and to communicate with the manager, chef, or server to help order a healthier meal.  Planning Your Eating Strategy  Clinical staff conducted group or individual video education with verbal and written material and guidebook.  Patient learns about the Pritikin Eating Plan and its benefit of reducing the risk of disease. The Pritikin Eating Plan does not focus on calories. Instead, it emphasizes high-quality, nutrient-rich foods. By knowing the characteristics of the foods, we choose, we can determine their calorie density and make informed decisions.  Targeting Your Nutrition Priorities  Clinical staff conducted group or individual video education with verbal and written material and guidebook.  Patient learns that lifestyle habits have a tremendous  impact on disease risk and progression. This video provides eating and physical activity recommendations based on your personal health goals, such as reducing LDL cholesterol, losing weight, preventing or controlling type 2 diabetes, and reducing high blood pressure.  Vitamins and Minerals  Clinical staff conducted group or individual video education with verbal and written material and guidebook.  Patient learns different ways to obtain key vitamins and minerals, including through a recommended healthy diet. It is important to discuss all supplements you take with your doctor.   Healthy Mind-Set    Smoking Cessation  Clinical staff conducted group or individual video education with verbal and written material and guidebook.  Patient learns that cigarette smoking and tobacco addiction pose a serious health risk which affects millions of people. Stopping smoking will significantly reduce the risk of heart disease, lung disease, and many forms of cancer. Recommended strategies for quitting are covered, including working with your doctor to develop a successful plan.  Culinary   Becoming a Set designer conducted group or individual video education with verbal and written material and guidebook.  Patient learns that cooking at home can be healthy, cost-effective, quick, and puts them in control. Keys to cooking healthy recipes will include looking at your recipe, assessing your equipment needs, planning ahead, making it simple, choosing cost-effective seasonal ingredients, and limiting the use of added fats, salts, and sugars.  Cooking - Breakfast and Snacks  Clinical staff conducted group or individual  video education with verbal and written material and guidebook.  Patient learns how important breakfast is to satiety and nutrition through the entire day. Recommendations include key foods to eat during breakfast to help stabilize blood sugar levels and to prevent overeating at meals  later in the day. Planning ahead is also a key component.  Cooking - Educational psychologist conducted group or individual video education with verbal and written material and guidebook.  Patient learns eating strategies to improve overall health, including an approach to cook more at home. Recommendations include thinking of animal protein as a side on your plate rather than center stage and focusing instead on lower calorie dense options like vegetables, fruits, whole grains, and plant-based proteins, such as beans. Making sauces in large quantities to freeze for later and leaving the skin on your vegetables are also recommended to maximize your experience.  Cooking - Healthy Salads and Dressing Clinical staff conducted group or individual video education with verbal and written material and guidebook.  Patient learns that vegetables, fruits, whole grains, and legumes are the foundations of the Pritikin Eating Plan. Recommendations include how to incorporate each of these in flavorful and healthy salads, and how to create homemade salad dressings. Proper handling of ingredients is also covered. Cooking - Soups and [employer] - Soups and Desserts Clinical staff conducted group or individual video education with verbal and written material and guidebook.  Patient learns that Pritikin soups and desserts make for easy, nutritious, and delicious snacks and meal components that are low in sodium, fat, sugar, and calorie density, while high in vitamins, minerals, and filling fiber. Recommendations include simple and healthy ideas for soups and desserts.   Overview     The Pritikin Solution Program Overview Clinical staff conducted group or individual video education with verbal and written material and guidebook.  Patient learns that the results of the Pritikin Program have been documented in more than 100 articles published in peer-reviewed journals, and the benefits include reducing  risk factors for (and, in some cases, even reversing) high cholesterol, high blood pressure, type 2 diabetes, obesity, and more! An overview of the three key pillars of the Pritikin Program will be covered: eating well, doing regular exercise, and having a healthy mind-set.  WORKSHOPS  Exercise: Exercise Basics: Building Your Action Plan Clinical staff led group instruction and group discussion with PowerPoint presentation and patient guidebook. To enhance the learning environment the use of posters, models and videos may be added. At the conclusion of this workshop, patients will comprehend the difference between physical activity and exercise, as well as the benefits of incorporating both, into their routine. Patients will understand the FITT (Frequency, Intensity, Time, and Type) principle and how to use it to build an exercise action plan. In addition, safety concerns and other considerations for exercise and cardiac rehab will be addressed by the presenter. The purpose of this lesson is to promote a comprehensive and effective weekly exercise routine in order to improve patients' overall level of fitness.   Managing Heart Disease: Your Path to a Healthier Heart Clinical staff led group instruction and group discussion with PowerPoint presentation and patient guidebook. To enhance the learning environment the use of posters, models and videos may be added.At the conclusion of this workshop, patients will understand the anatomy and physiology of the heart. Additionally, they will understand how Pritikin's three pillars impact the risk factors, the progression, and the management of heart disease.  The purpose of  this lesson is to provide a high-level overview of the heart, heart disease, and how the Pritikin lifestyle positively impacts risk factors.  Exercise Biomechanics Clinical staff led group instruction and group discussion with PowerPoint presentation and patient guidebook. To enhance  the learning environment the use of posters, models and videos may be added. Patients will learn how the structural parts of their bodies function and how these functions impact their daily activities, movement, and exercise. Patients will learn how to promote a neutral spine, learn how to manage pain, and identify ways to improve their physical movement in order to promote healthy living. The purpose of this lesson is to expose patients to common physical limitations that impact physical activity. Participants will learn practical ways to adapt and manage aches and pains, and to minimize their effect on regular exercise. Patients will learn how to maintain good posture while sitting, walking, and lifting.  Balance Training and Fall Prevention  Clinical staff led group instruction and group discussion with PowerPoint presentation and patient guidebook. To enhance the learning environment the use of posters, models and videos may be added. At the conclusion of this workshop, patients will understand the importance of their sensorimotor skills (vision, proprioception, and the vestibular system) in maintaining their ability to balance as they age. Patients will apply a variety of balancing exercises that are appropriate for their current level of function. Patients will understand the common causes for poor balance, possible solutions to these problems, and ways to modify their physical environment in order to minimize their fall risk. The purpose of this lesson is to teach patients about the importance of maintaining balance as they age and ways to minimize their risk of falling.  WORKSHOPS   Nutrition:  Fueling a Ship broker led group instruction and group discussion with PowerPoint presentation and patient guidebook. To enhance the learning environment the use of posters, models and videos may be added. Patients will review the foundational principles of the Pritikin Eating Plan  and understand what constitutes a serving size in each of the food groups. Patients will also learn Pritikin-friendly foods that are better choices when away from home and review make-ahead meal and snack options. Calorie density will be reviewed and applied to three nutrition priorities: weight maintenance, weight loss, and weight gain. The purpose of this lesson is to reinforce (in a group setting) the key concepts around what patients are recommended to eat and how to apply these guidelines when away from home by planning and selecting Pritikin-friendly options. Patients will understand how calorie density may be adjusted for different weight management goals.  Mindful Eating  Clinical staff led group instruction and group discussion with PowerPoint presentation and patient guidebook. To enhance the learning environment the use of posters, models and videos may be added. Patients will briefly review the concepts of the Pritikin Eating Plan and the importance of low-calorie dense foods. The concept of mindful eating will be introduced as well as the importance of paying attention to internal hunger signals. Triggers for non-hunger eating and techniques for dealing with triggers will be explored. The purpose of this lesson is to provide patients with the opportunity to review the basic principles of the Pritikin Eating Plan, discuss the value of eating mindfully and how to measure internal cues of hunger and fullness using the Hunger Scale. Patients will also discuss reasons for non-hunger eating and learn strategies to use for controlling emotional eating.  Targeting Your Nutrition Priorities Clinical staff led group instruction  and group discussion with PowerPoint presentation and patient guidebook. To enhance the learning environment the use of posters, models and videos may be added. Patients will learn how to determine their genetic susceptibility to disease by reviewing their family history. Patients  will gain insight into the importance of diet as part of an overall healthy lifestyle in mitigating the impact of genetics and other environmental insults. The purpose of this lesson is to provide patients with the opportunity to assess their personal nutrition priorities by looking at their family history, their own health history and current risk factors. Patients will also be able to discuss ways of prioritizing and modifying the Prit[facility]in Eating Plan for their highest risk areas  Menu  Clinical staff led group instruction and group discussion with PowerPoint presentation and patient guidebook. To enhance the learning environment the use of posters, models and videos may be added. Using menus brought in from [employer], or printed from [employer], patients will apply the Pritikin dining out guidelines that were presented in the [employer] video. Patients will also be able to practice these guidelines in a variety of provided scenarios. The purpose of this lesson is to provide patients with the opportunity to practice hands-on learning of the Pritikin Dining Out guidelines with actual menus and practice scenarios.  Label Reading Clinical staff led group instruction and group discussion with PowerPoint presentation and patient guidebook. To enhance the learning environment the use of posters, models and videos may be added. Patients will review and discuss the Pritikin label reading guidelines presented in [employer]'s Label Reading Educational series video. Using fool labels brought in from local grocery stores and markets, patients will apply the label reading guidelines and determine if the packaged food meet the Pritikin guidelines. The purpose of this lesson is to provide patients with the opportunity to review, discuss, and practice hands-on learning of the Pritikin Label Reading guidelines with actual packaged food labels. Cooking School  Prit[facility]'s [employer]  are designed to teach patients ways to prepare quick, simple, and affordable recipes at home. The importance of nutrition's role in chronic disease risk reduction is reflected in its emphasis in the overall Pritikin program. By learning how to prepare essential core Pritikin Eating Plan recipes, patients will increase control over what they eat; be able to customize the flavor of foods without the use of added salt, sugar, or fat; and improve the quality of the food they consume. By learning a set of core recipes which are easily assembled, quickly prepared, and affordable, patients are more likely to prepare more healthy foods at home. These workshops focus on convenient breakfasts, simple entres, side dishes, and desserts which can be prepared with minimal effort and are consistent with nutrition recommendations for cardiovascular risk reduction. Cooking [employer] are taught by a Armed forces logistics/support/administrative officer (RD) who has been trained by the [employer]. The chef or RD has a clear understanding of the importance of minimizing - if not completely eliminating - added fat, sugar, and sodium in recipes. Throughout the series of Cooking School Workshop sessions, patients will learn about healthy ingredients and efficient methods of cooking to build confidence in their capability to prepare    Cooking School weekly topics:  Adding Flavor- Sodium-Free  Fast and Healthy Breakfasts  Powerhouse Plant-Based Proteins  Satisfying Salads and Dressings  Simple Sides and Sauces  International Cuisine-Spotlight on the [employer] Zones  Delicious Desserts  Savory Soups  [employer] - Meals in  a Snap  Tasty Appetizers and Snacks  Comforting Weekend Breakfasts  One-Pot Wonders   Fast Evening Meals  Landscape architect Your Pritikin Plate  WORKSHOPS   Healthy Mindset (Psychosocial):  Focused Goals, Sustainable Changes Clinical staff led group instruction and group discussion  with PowerPoint presentation and patient guidebook. To enhance the learning environment the use of posters, models and videos may be added. Patients will be able to apply effective goal setting strategies to establish at least one personal goal, and then take consistent, meaningful action toward that goal. They will learn to identify common barriers to achieving personal goals and develop strategies to overcome them. Patients will also gain an understanding of how our mind-set can impact our ability to achieve goals and the importance of cultivating a positive and growth-oriented mind-set. The purpose of this lesson is to provide patients with a deeper understanding of how to set and achieve personal goals, as well as the tools and strategies needed to overcome common obstacles which may arise along the way.  From Head to Heart: The Power of a Healthy Outlook  Clinical staff led group instruction and group discussion with PowerPoint presentation and patient guidebook. To enhance the learning environment the use of posters, models and videos may be added. Patients will be able to recognize and describe the impact of emotions and mood on physical health. They will discover the importance of self-care and explore self-care practices which may work for them. Patients will also learn how to utilize the 4 C's to cultivate a healthier outlook and better manage stress and challenges. The purpose of this lesson is to demonstrate to patients how a healthy outlook is an essential part of maintaining good health, especially as they continue their cardiac rehab journey.  Healthy Sleep for a Healthy Heart Clinical staff led group instruction and group discussion with PowerPoint presentation and patient guidebook. To enhance the learning environment the use of posters, models and videos may be added. At the conclusion of this workshop, patients will be able to demonstrate knowledge of the importance of sleep to overall  health, well-being, and quality of life. They will understand the symptoms of, and treatments for, common sleep disorders. Patients will also be able to identify daytime and nighttime behaviors which impact sleep, and they will be able to apply these tools to help manage sleep-related challenges. The purpose of this lesson is to provide patients with a general overview of sleep and outline the importance of quality sleep. Patients will learn about a few of the most common sleep disorders. Patients will also be introduced to the concept of "sleep hygiene," and discover ways to self-manage certain sleeping problems through simple daily behavior changes. Finally, the workshop will motivate patients by clarifying the links between quality sleep and their goals of heart-healthy living.   Recognizing and Reducing Stress Clinical staff led group instruction and group discussion with PowerPoint presentation and patient guidebook. To enhance the learning environment the use of posters, models and videos may be added. At the conclusion of this workshop, patients will be able to understand the types of stress reactions, differentiate between acute and chronic stress, and recognize the impact that chronic stress has on their health. They will also be able to apply different coping mechanisms, such as reframing negative self-talk. Patients will have the opportunity to practice a variety of stress management techniques, such as deep abdominal breathing, progressive muscle relaxation, and/or guided imagery.  The purpose of this lesson is to educate  patients on the role of stress in their lives and to provide healthy techniques for coping with it.  Learning Barriers/Preferences:  Learning Barriers/Preferences - 03/03/24 1204       Learning Barriers/Preferences   Learning Barriers Hearing;Sight;Exercise Concerns   Desarai wears glasses and has a hearing deficit. Leeloo says she becomes lightheaded due to issues with  allergies.   Learning Preferences Skilled Demonstration          Education Topics:  Knowledge Questionnaire Score:  Knowledge Questionnaire Score - 03/03/24 1402       Knowledge Questionnaire Score   Pre Score 19/24          Core Components/Risk Factors/Patient Goals at Admission:  Personal Goals and Risk Factors at Admission - 03/03/24 1144       Core Components/Risk Factors/Patient Goals on Admission    Weight Management Yes;Obesity;Weight Loss    Intervention Weight Management/Obesity: Establish reasonable short term and long term weight goals.;Obesity: Provide education and appropriate resources to help participant work on and attain dietary goals.    Admit Weight 218 lb 14.7 oz (99.3 kg)    Expected Outcomes Short Term: Continue to assess and modify interventions until short term weight is achieved;Long Term: Adherence to nutrition and physical activity/exercise program aimed toward attainment of established weight goal;Weight Loss: Understanding of general recommendations for a balanced deficit meal plan, which promotes 1-2 lb weight loss per week and includes a negative energy balance of [phone] kcal/d    Hypertension Yes    Intervention Provide education on lifestyle modifcations including regular physical activity/exercise, weight management, moderate sodium restriction and increased consumption of fresh fruit, vegetables, and low fat dairy, alcohol moderation, and smoking cessation.;Monitor prescription use compliance.    Expected Outcomes Short Term: Continued assessment and intervention until BP is < 140/43mm HG in hypertensive participants. < 130/59mm HG in hypertensive participants with diabetes, heart failure or chronic kidney disease.;Long Term: Maintenance of blood pressure at goal levels.    Lipids Yes    Intervention Provide education and support for participant on nutrition & aerobic/resistive exercise along with prescribed medications to achieve LDL 70mg , HDL  >40mg .    Expected Outcomes Short Term: Participant states understanding of desired cholesterol values and is compliant with medications prescribed. Participant is following exercise prescription and nutrition guidelines.;Long Term: Cholesterol controlled with medications as prescribed, with individualized exercise RX and with personalized nutrition plan. Value goals: LDL < 70mg , HDL > 40 mg.          Core Components/Risk Factors/Patient Goals Review:   Goals and Risk Factor Review     Row Name 03/09/24 1743             Core Components/Risk Factors/Patient Goals Review   Personal Goals Review Weight Management/Obesity;Hypertension;Lipids       Review Melondy started cardiac rehab on 03/09/24. Harmony did fair with exercise for her fitness level Vital signs were stable.       Expected Outcomes Matrtha will continue to participat in cardiac rehab for exercise nutrition and lifestyle modificaitons          Core Components/Risk Factors/Patient Goals at Discharge (Final Review):   Goals and Risk Factor Review - 03/09/24 1743       Core Components/Risk Factors/Patient Goals Review   Personal Goals Review Weight Management/Obesity;Hypertension;Lipids    Review Masayo started cardiac rehab on 03/09/24. Nyana did fair with exercise for her fitness level Vital signs were stable.    Expected Outcomes Flavia Hughs will continue to participat in cardiac  rehab for exercise nutrition and lifestyle modificaitons          ITP Comments:  ITP Comments     Row Name 03/03/24 1059 03/09/24 1135         ITP Comments Dr Gaylyn Keas MD, Medical Director. Introduction to [facility]in Education Program/ Intensive Cardiac Rehab. Initial Orientation packet Reviewed with the patient. 30-day ITP review. Ashlye completed first session of exercise well without symptoms. Oriented her to the exercise equipment and stretching routine.         Comments: See ITP Comments

## 2024-03-11 ENCOUNTER — Encounter (HOSPITAL_COMMUNITY)
Admission: RE | Admit: 2024-03-11 | Discharge: 2024-03-11 | Discharge status: Home / Self Care | Source: Ambulatory Visit | Attending: Internal Medicine

## 2024-03-11 DIAGNOSIS — I2111 ST elevation (STEMI) myocardial infarction involving right coronary artery: Secondary | ICD-10-CM

## 2024-03-11 DIAGNOSIS — I252 Old myocardial infarction: Secondary | ICD-10-CM | POA: Diagnosis not present

## 2024-03-11 DIAGNOSIS — Z955 Presence of coronary angioplasty implant and graft: Secondary | ICD-10-CM | POA: Diagnosis not present

## 2024-03-11 DIAGNOSIS — L819 Disorder of pigmentation, unspecified: Secondary | ICD-10-CM | POA: Diagnosis not present

## 2024-03-11 DIAGNOSIS — Z48812 Encounter for surgical aftercare following surgery on the circulatory system: Secondary | ICD-10-CM | POA: Diagnosis not present

## 2024-03-13 ENCOUNTER — Other Ambulatory Visit: Payer: Self-pay | Admitting: *Deleted

## 2024-03-13 ENCOUNTER — Ambulatory Visit: Payer: Self-pay | Admitting: Podiatry

## 2024-03-13 ENCOUNTER — Encounter (HOSPITAL_COMMUNITY)
Admission: RE | Admit: 2024-03-13 | Discharge: 2024-03-13 | Disposition: A | Discharge status: Home / Self Care | Source: Ambulatory Visit | Attending: Internal Medicine | Admitting: Internal Medicine

## 2024-03-13 ENCOUNTER — Ambulatory Visit (HOSPITAL_COMMUNITY)
Admission: RE | Admit: 2024-03-13 | Discharge: 2024-03-13 | Disposition: A | Discharge status: Home / Self Care | Source: Ambulatory Visit | Attending: Podiatry | Admitting: Podiatry

## 2024-03-13 DIAGNOSIS — Z48812 Encounter for surgical aftercare following surgery on the circulatory system: Secondary | ICD-10-CM | POA: Diagnosis not present

## 2024-03-13 DIAGNOSIS — Z955 Presence of coronary angioplasty implant and graft: Secondary | ICD-10-CM | POA: Diagnosis not present

## 2024-03-13 DIAGNOSIS — I2111 ST elevation (STEMI) myocardial infarction involving right coronary artery: Secondary | ICD-10-CM

## 2024-03-13 DIAGNOSIS — L819 Disorder of pigmentation, unspecified: Secondary | ICD-10-CM

## 2024-03-13 DIAGNOSIS — I252 Old myocardial infarction: Secondary | ICD-10-CM | POA: Diagnosis not present

## 2024-03-13 LAB — VAS US ABI WITH/WO TBI
Left ABI: 1.13
Right ABI: 1.27

## 2024-03-13 MED ORDER — VALSARTAN 40 MG PO TABS: 40.0000 mg | ORAL_TABLET | Freq: Every day | ORAL | 3 refills | Status: AC

## 2024-03-16 ENCOUNTER — Encounter (HOSPITAL_COMMUNITY)
Admission: RE | Admit: 2024-03-16 | Discharge: 2024-03-16 | Disposition: A | Discharge status: Home / Self Care | Source: Ambulatory Visit | Attending: Internal Medicine | Admitting: Internal Medicine

## 2024-03-16 DIAGNOSIS — I2111 ST elevation (STEMI) myocardial infarction involving right coronary artery: Secondary | ICD-10-CM

## 2024-03-16 DIAGNOSIS — Z48812 Encounter for surgical aftercare following surgery on the circulatory system: Secondary | ICD-10-CM | POA: Diagnosis not present

## 2024-03-16 DIAGNOSIS — Z955 Presence of coronary angioplasty implant and graft: Secondary | ICD-10-CM

## 2024-03-16 DIAGNOSIS — I252 Old myocardial infarction: Secondary | ICD-10-CM | POA: Diagnosis not present

## 2024-03-16 DIAGNOSIS — L819 Disorder of pigmentation, unspecified: Secondary | ICD-10-CM | POA: Diagnosis not present

## 2024-03-18 ENCOUNTER — Other Ambulatory Visit: Payer: Self-pay

## 2024-03-18 ENCOUNTER — Ambulatory Visit (INDEPENDENT_AMBULATORY_CARE_PROVIDER_SITE_OTHER): Admitting: Allergy

## 2024-03-18 ENCOUNTER — Encounter: Payer: Self-pay | Admitting: Allergy

## 2024-03-18 VITALS — BP 138/84 | HR 77 | Temp 97.3°F | Resp 19

## 2024-03-18 DIAGNOSIS — T7800XD Anaphylactic reaction due to unspecified food, subsequent encounter: Secondary | ICD-10-CM | POA: Diagnosis not present

## 2024-03-18 DIAGNOSIS — J3089 Other allergic rhinitis: Secondary | ICD-10-CM

## 2024-03-18 DIAGNOSIS — J309 Allergic rhinitis, unspecified: Secondary | ICD-10-CM

## 2024-03-18 MED ORDER — MONTELUKAST SODIUM 10 MG PO TABS: 10.0000 mg | ORAL_TABLET | Freq: Every day | ORAL | 5 refills | Status: DC

## 2024-03-18 MED ORDER — AZELASTINE HCL 0.05 % OP SOLN: 1.0000 [drp] | Freq: Two times a day (BID) | OPHTHALMIC | 5 refills | Status: DC | PRN

## 2024-03-18 MED ORDER — LEVOCETIRIZINE DIHYDROCHLORIDE 5 MG PO TABS: 5.0000 mg | ORAL_TABLET | Freq: Every evening | ORAL | 5 refills | Status: DC

## 2024-03-18 MED ORDER — ALBUTEROL SULFATE HFA 108 (90 BASE) MCG/ACT IN AERS: INHALATION_SPRAY | RESPIRATORY_TRACT | 1 refills | Status: DC

## 2024-03-18 NOTE — Progress Notes (Signed)
 Follow-up Note  RE: Anne Barnes MRN: [id number] DOB: [birth year] Date of Office Visit: 03/18/2024   History of present illness: Anne Barnes is a 78 y.o. female presenting today for follow-up of CRS with allergic rhinitis, food allergy  and asthma.  She was last seen in the office on 11/29/23 by myself.   Discussed the use of AI scribe software for clinical note transcription with the patient, who gave verbal consent to proceed.  She has been receiving allergy  shots since 2021 and is in the maintenance phase and is hopefully nearing completion.  She has not experienced any allergic reactions to gluten or soy and has not used her EpiPen . She continues to avoid peanuts and shrimp and is interested in allergy  testing to assess her current status. She has stopped taking allergy  medications since Saturday in preparation for potential testing.  She experiences mucus buildup in her ears causing lightheadedness/vertigo, which is relieved by Flonase  and/or Xhance .  She does prefer the Xhance  as she feels it is more effective.    She has not used her rescue inhaler since March, indicating stable control of her asthma symptoms.  She experienced a heart attack in the beginning of May and is currently undergoing cardiac rehabilitation three times a week. Initially, she mistook the symptoms for acid reflux, experiencing chest pain where she took antireflux medication but then she developed diarrhea, and fatigue.  The chest pain did improve when she went to sleep when she woke up the chest pain redeveloped and she called 911.  She states it felt like her chest was exploding.  In the she has a history of rheumatic fever and was previously informed of a heart condition at age 41.  She has been experiencing burning, swelling, and tingling in her toes for over a year. Despite seeing a podiatrist and undergoing various tests, including ultrasounds and x-rays, the cause remains undiagnosed. Her blood circulation  is reportedly normal.  She has cataracts and is unable to have them removed due to being on blood thinners now. She also requires hearing aid which she does plan to get fitted for.     Review of systems: 10pt ROS negative unless noted above in HPI   Past medical/social/surgical/family history have been reviewed and are unchanged unless specifically indicated below.  No changes  Medication List: Current Outpatient Medications  Medication Sig Dispense Refill   albuterol  (VENTOLIN  HFA) 108 (90 Base) MCG/ACT inhaler INHALE 2 PUFFS BY MOUTH EVERY 6 HOURS AS NEEDED FOR WHEEZING FOR SHORTNESS OF BREATH (Patient taking differently: Inhale 2 puffs into the lungs every 6 (six) hours as needed for wheezing or shortness of breath. INHALE 2 PUFFS BY MOUTH EVERY 6 HOURS AS NEEDED FOR WHEEZING FOR SHORTNESS OF BREATH) 18 g 12   alendronate  (FOSAMAX ) 70 MG tablet Take 1 tablet (70 mg total) by mouth once a week. Take with a full glass of water on an empty stomach. (Patient taking differently: Take 70 mg by mouth once a week. Take with a full glass of water on an empty stomach. On Saturdays) 4 tablet 5   ALPRAZolam  (XANAX ) 0.5 MG tablet TAKE 1 TABLET BY MOUTH TWICE DAILY AS NEEDED FOR ANXIETY AND FOR SLEEP (Patient taking differently: Take 0.25 mg by mouth 2 (two) times daily as needed for anxiety or sleep.) 20 tablet 0   amLODipine  (NORVASC ) 2.5 MG tablet Take 1 tablet (2.5 mg total) by mouth daily. 30 tablet 3   aspirin  EC 81 MG tablet Take  1 tablet (81 mg total) by mouth daily. Swallow whole. 30 tablet 3   azelastine  (ASTELIN ) 0.1 % nasal spray Place 2 sprays into both nostrils 2 (two) times daily as needed for rhinitis. 30 mL 5   azelastine  (OPTIVAR ) 0.05 % ophthalmic solution Place 1 drop into both eyes 2 (two) times daily as needed. 6 mL 5   b complex vitamins tablet Take 1 tablet by mouth daily.     Cholecalciferol (VITAMIN D ) 50 MCG (2000 UT) tablet Take 2,000 Units by mouth daily.     clopidogrel   (PLAVIX ) 75 MG tablet Take 1 tablet (75 mg total) by mouth daily with breakfast. 90 tablet 3   empagliflozin  (JARDIANCE ) 10 MG TABS tablet Take 1 tablet (10 mg total) by mouth daily before breakfast. 90 tablet 3   EPINEPHrine  0.3 mg/0.3 mL IJ SOAJ injection Inject 0.3 mg into the muscle as needed for anaphylaxis. 2 each 2   fluticasone  (FLONASE ) 50 MCG/ACT nasal spray Use 1-2 sprays in each nostril once  a day as needed for stuffy nose. In the right nostril, point the applicator out toward the right ear. In the left nostril, point the applicator out toward the left ear 16 g 5   Fluticasone  Propionate (XHANCE  NA) Place 2 sprays into the nose 2 (two) times daily as needed.     furosemide  (LASIX ) 20 MG tablet Take 1 tablet (20 mg total) by mouth 3 (three) times a week. Monday, Wednesday and Friday. 90 tablet 3   levocetirizine (XYZAL ) 5 MG tablet TAKE 1 TABLET BY MOUTH ONCE DAILY IN THE EVENING 30 tablet 5   MAGNESIUM  CITRATE PO Take 1 tablet by mouth daily.     montelukast  (SINGULAIR ) 10 MG tablet TAKE 1 TABLET BY MOUTH AT BEDTIME 30 tablet 5   Multiple Vitamin (MULTIVITAMIN) tablet Take 1 tablet by mouth daily.     nitroGLYCERIN  (NITROSTAT ) 0.4 MG SL tablet Place 1 tablet (0.4 mg total) under the tongue every 5 (five) minutes as needed for chest pain. 25 tablet 1   pantoprazole  (PROTONIX ) 40 MG tablet Take 1 tablet (40 mg total) by mouth daily. 90 tablet 3   Pseudoephedrine-Ibuprofen (ADVIL COLD/SINUS PO) Take 2 tablets by mouth as needed.     rosuvastatin  (CRESTOR ) 20 MG tablet Take 1 tablet (20 mg total) by mouth daily. 90 tablet 3   spironolactone  (ALDACTONE ) 25 MG tablet Take 1 tablet (25 mg total) by mouth daily. 90 tablet 2   UNABLE TO FIND Take 2 capsules by mouth daily. Fish Oil w/ tumeric     valsartan  (DIOVAN ) 40 MG tablet Take 1 tablet (40 mg total) by mouth daily. 90 tablet 3   No current facility-administered medications for this visit.     Known medication allergies: Allergies   Allergen Reactions   Codeine Nausea And Vomiting   Hydralazine  Hcl Other (See Comments)    Pt reports causes dizziness   Lisinopril-Hydrochlorothiazide Hives    REACTION: lip swelling,facial swelling,rash.   Metoprolol  Other (See Comments)    Does not feel well on it    Other Cough    Patient states she is allergic to Shrimp, egg whites, tomatoes and dairy products.  Says they make her feel unwell but do not cause hives, SOB or anaphylaxis.  She just avoids eating them because they can make her have a headache.  Strawberries, watermelon, peanuts, and all shellfish.    environmental   Shrimp [Shellfish Allergy ] Other (See Comments)    Was allergy  tested and  showed shrimp was one--has not reacted b/c avoided     Physical examination: Blood pressure 138/84, pulse 77, temperature (!) 97.3 F (36.3 C), temperature source Temporal, resp. rate 19, SpO2 97%.  General: Alert, interactive, in no acute distress. HEENT: PERRLA, TMs pearly gray, turbinates mildly edematous without discharge, post-pharynx non erythematous. Neck: Supple without lymphadenopathy. Lungs: Clear to auscultation without wheezing, rhonchi or rales. {no increased work of breathing. CV: Normal S1, S2 without murmurs. Abdomen: Nondistended, nontender. Skin: Warm and dry, without lesions or rashes. Extremities:  No clubbing, cyanosis or edema. Neuro:   Grossly intact.  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   - Hold Flonase  and Astelin  while using Xhance  - Use Xhance  nasal spray 2 sprays each nostril 1-2 times a day for congestion/drainage.  Provided with samples today.  This spray is a stronger version of Flonase  and should help decrease sinus congestion/ear symptoms - continue Xyzal  5mg  daily - continue Singulair  10mg  daily - use Optivar  1 drop each eye up to twice  a day as needed for itchy/watery/red eyes.  - continue allergy  injections per schedule and have access to epipen .  - will get environmental allergy  testing via bloodwork at this time.  Will call you with these results.  Food allergy  - continue food avoidance of shellfish, peanut  - will obtain serum IgE levels for these foods.   - have access to your epinephrine  device 0.3mg  in case of allergic reaction.   - follow emergency action plan in case of allergic reaction   Mild intermittent asthma - have access to albuterol  inhaler 2 puffs every 4-6 hours as needed for cough/wheeze/shortness of breath/chest tightness.  May use 15-20 minutes prior to activity.   Monitor frequency of use.    Follow-up in 4-6 months or sooner if needed  I appreciate the opportunity to take part in [place] care. Please do not hesitate to contact me with questions.  Sincerely,   Danita Brain, MD Allergy /Immunology [facility]

## 2024-03-18 NOTE — Patient Instructions (Addendum)
 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   - Hold Flonase  and Astelin  while using Xhance  - Use Xhance  nasal spray 2 sprays each nostril 1-2 times a day for congestion/drainage.  Provided with samples today.  This spray is a stronger version of Flonase  and should help decrease sinus congestion/ear symptoms - continue Xyzal  5mg  daily - continue Singulair  10mg  daily - use Optivar  1 drop each eye up to twice a day as needed for itchy/watery/red eyes.  - continue allergy  injections per schedule and have access to epipen .  - will get environmental allergy  testing via bloodwork at this time.  Will call you with these results.  Food allergy  - continue food avoidance of shellfish, peanut  - will obtain serum IgE levels for these foods.   - have access to your epinephrine  device 0.3mg  in case of allergic reaction.   - follow emergency action plan in case of allergic reaction   Mild intermittent asthma - have access to albuterol  inhaler 2 puffs every 4-6 hours as needed for cough/wheeze/shortness of breath/chest tightness.  May use 15-20 minutes prior to activity.   Monitor frequency of use.    Follow-up in 4-6 months or sooner if needed

## 2024-03-20 ENCOUNTER — Encounter (HOSPITAL_COMMUNITY)
Admission: RE | Admit: 2024-03-20 | Discharge: 2024-03-20 | Disposition: A | Discharge status: Home / Self Care | Source: Ambulatory Visit | Attending: Internal Medicine | Admitting: Internal Medicine

## 2024-03-20 DIAGNOSIS — Z955 Presence of coronary angioplasty implant and graft: Secondary | ICD-10-CM | POA: Diagnosis not present

## 2024-03-20 DIAGNOSIS — L819 Disorder of pigmentation, unspecified: Secondary | ICD-10-CM | POA: Diagnosis not present

## 2024-03-20 DIAGNOSIS — I252 Old myocardial infarction: Secondary | ICD-10-CM | POA: Diagnosis not present

## 2024-03-20 DIAGNOSIS — I2111 ST elevation (STEMI) myocardial infarction involving right coronary artery: Secondary | ICD-10-CM

## 2024-03-20 DIAGNOSIS — Z48812 Encounter for surgical aftercare following surgery on the circulatory system: Secondary | ICD-10-CM | POA: Diagnosis not present

## 2024-03-21 LAB — IGE NUT PROF. W/COMPONENT RFLX

## 2024-03-23 ENCOUNTER — Encounter (HOSPITAL_COMMUNITY)
Admission: RE | Admit: 2024-03-23 | Discharge: 2024-03-23 | Disposition: A | Discharge status: Home / Self Care | Source: Ambulatory Visit | Attending: Internal Medicine | Admitting: Internal Medicine

## 2024-03-23 DIAGNOSIS — Z955 Presence of coronary angioplasty implant and graft: Secondary | ICD-10-CM | POA: Diagnosis not present

## 2024-03-23 DIAGNOSIS — I2111 ST elevation (STEMI) myocardial infarction involving right coronary artery: Secondary | ICD-10-CM

## 2024-03-23 DIAGNOSIS — I252 Old myocardial infarction: Secondary | ICD-10-CM | POA: Diagnosis not present

## 2024-03-23 DIAGNOSIS — Z48812 Encounter for surgical aftercare following surgery on the circulatory system: Secondary | ICD-10-CM | POA: Diagnosis not present

## 2024-03-23 DIAGNOSIS — L819 Disorder of pigmentation, unspecified: Secondary | ICD-10-CM | POA: Diagnosis not present

## 2024-03-23 LAB — ALLERGEN PROFILE, SHELLFISH
Clam IgE: <0.1 kU/L
F023-IgE Crab: <0.1 kU/L
F080-IgE Lobster: <0.1 kU/L
F290-IgE Oyster: <0.1 kU/L
Scallop IgE: <0.1 kU/L
Shrimp IgE: 0.15 kU/L — AB

## 2024-03-23 LAB — IGE NUT PROF. W/COMPONENT RFLX
F017-IgE Hazelnut (Filbert): 0.22 kU/L — AB
F018-IgE Brazil Nut: <0.1 kU/L
F202-IgE Cashew Nut: 0.22 kU/L — AB
F202-IgE Cashew Nut: <0.1 kU/L
F256-IgE Walnut: 0.13 kU/L — AB
Jug R 3 IgE: 0.55 kU/L — AB
Macadamia Nut, IgE: <0.1 kU/L
Peanut, IgE: 0.84 kU/L — AB
Pecan Nut IgE: <0.1 kU/L

## 2024-03-23 LAB — ALLERGENS W/TOTAL IGE AREA 2
Alternaria Alternata IgE: <0.1 kU/L
Aspergillus Fumigatus IgE: <0.1 kU/L
Bermuda Grass IgE: <0.1 kU/L
Cat Dander IgE: 2.35 kU/L — AB
Cedar, Mountain IgE: <0.1 kU/L
Cladosporium Herbarum IgE: <0.1 kU/L
Cockroach, German IgE: 0.24 kU/L — AB
Common Silver Birch IgE: <0.1 kU/L
Cottonwood IgE: <0.1 kU/L
D Farinae IgE: 1.74 kU/L — AB
D Pteronyssinus IgE: 1.13 kU/L — AB
Dog Dander IgE: 2.97 kU/L — AB
Elm, American IgE: <0.1 kU/L
IgE (Immunoglobulin E), Serum: 227 [IU]/mL (ref 6–495)
Johnson Grass IgE: <0.1 kU/L
Maple/Box Elder IgE: <0.1 kU/L
Mouse Urine IgE: 0.1 kU/L — AB
Oak, White IgE: <0.1 kU/L
Pecan, Hickory IgE: <0.1 kU/L
Penicillium Chrysogen IgE: <0.1 kU/L
Pigweed, Rough IgE: <0.1 kU/L
Ragweed, Short IgE: <0.1 kU/L
Sheep Sorrel IgE Qn: <0.1 kU/L
Timothy Grass IgE: <0.1 kU/L
White Mulberry IgE: <0.1 kU/L

## 2024-03-23 LAB — PANEL 604239: ANA O 3 IgE: 0.21 kU/L — AB

## 2024-03-23 LAB — ALLERGEN COMPONENT COMMENTS

## 2024-03-23 LAB — PANEL 604721
Jug R 1 IgE: <0.1 kU/L
Jug R 3 IgE: <0.1 kU/L

## 2024-03-23 LAB — PEANUT COMPONENTS
F352-IgE Ara h 8: <0.1 kU/L
F422-IgE Ara h 1: <0.1 kU/L
F423-IgE Ara h 2: <0.1 kU/L
F424-IgE Ara h 3: <0.1 kU/L
F427-IgE Ara h 9: <0.1 kU/L
F447-IgE Ara h 6: <0.1 kU/L

## 2024-03-23 LAB — PANEL 604726
Cor A 1 IgE: <0.1 kU/L
Cor A 14 IgE: <0.1 kU/L
Cor A 8 IgE: <0.1 kU/L
Cor A 9 IgE: <0.1 kU/L

## 2024-03-25 ENCOUNTER — Other Ambulatory Visit: Payer: Self-pay | Admitting: Family Medicine

## 2024-03-25 ENCOUNTER — Encounter (HOSPITAL_COMMUNITY)
Admission: RE | Admit: 2024-03-25 | Discharge: 2024-03-25 | Disposition: A | Discharge status: Home / Self Care | Source: Ambulatory Visit | Attending: Internal Medicine | Admitting: Internal Medicine

## 2024-03-25 DIAGNOSIS — I2111 ST elevation (STEMI) myocardial infarction involving right coronary artery: Secondary | ICD-10-CM

## 2024-03-25 DIAGNOSIS — Z955 Presence of coronary angioplasty implant and graft: Secondary | ICD-10-CM | POA: Diagnosis not present

## 2024-03-25 DIAGNOSIS — I252 Old myocardial infarction: Secondary | ICD-10-CM | POA: Diagnosis not present

## 2024-03-25 DIAGNOSIS — Z48812 Encounter for surgical aftercare following surgery on the circulatory system: Secondary | ICD-10-CM | POA: Insufficient documentation

## 2024-03-25 NOTE — Progress Notes (Signed)
 Reviewed home exercise guidelines with Liba including endpoints, temperature precautions, target heart rate and rate of perceived exertion. She is currently doing the stretches from cardiac rehab at home and walking 30 minutes 3 days/week using her rollator as her mode of home exercise. She has a treadmill that she uses when the weather isn't conducive to walking outdoors but hasn't been using it the past couple of months. She has a smart watch to monitor her pulse. Ariyanah voices understanding of instructions given.  Arnoldo CHRISTELLA Gal, MS, ACSM CEP

## 2024-03-30 ENCOUNTER — Encounter (HOSPITAL_COMMUNITY)
Admission: RE | Admit: 2024-03-30 | Discharge: 2024-03-30 | Disposition: A | Discharge status: Home / Self Care | Source: Ambulatory Visit | Attending: Internal Medicine

## 2024-03-30 DIAGNOSIS — Z48812 Encounter for surgical aftercare following surgery on the circulatory system: Secondary | ICD-10-CM | POA: Diagnosis not present

## 2024-03-30 DIAGNOSIS — Z955 Presence of coronary angioplasty implant and graft: Secondary | ICD-10-CM

## 2024-03-30 DIAGNOSIS — I2111 ST elevation (STEMI) myocardial infarction involving right coronary artery: Secondary | ICD-10-CM

## 2024-03-30 DIAGNOSIS — I252 Old myocardial infarction: Secondary | ICD-10-CM | POA: Diagnosis not present

## 2024-04-01 ENCOUNTER — Encounter (HOSPITAL_COMMUNITY)
Admission: RE | Admit: 2024-04-01 | Discharge: 2024-04-01 | Disposition: A | Discharge status: Home / Self Care | Source: Ambulatory Visit | Attending: Internal Medicine

## 2024-04-01 DIAGNOSIS — I2111 ST elevation (STEMI) myocardial infarction involving right coronary artery: Secondary | ICD-10-CM

## 2024-04-01 DIAGNOSIS — Z955 Presence of coronary angioplasty implant and graft: Secondary | ICD-10-CM

## 2024-04-01 DIAGNOSIS — I252 Old myocardial infarction: Secondary | ICD-10-CM | POA: Diagnosis not present

## 2024-04-01 DIAGNOSIS — Z48812 Encounter for surgical aftercare following surgery on the circulatory system: Secondary | ICD-10-CM | POA: Diagnosis not present

## 2024-04-03 ENCOUNTER — Encounter (HOSPITAL_COMMUNITY)
Admission: RE | Admit: 2024-04-03 | Discharge: 2024-04-03 | Disposition: A | Discharge status: Home / Self Care | Source: Ambulatory Visit | Attending: Internal Medicine | Admitting: Internal Medicine

## 2024-04-03 DIAGNOSIS — I2111 ST elevation (STEMI) myocardial infarction involving right coronary artery: Secondary | ICD-10-CM

## 2024-04-03 DIAGNOSIS — Z48812 Encounter for surgical aftercare following surgery on the circulatory system: Secondary | ICD-10-CM | POA: Diagnosis not present

## 2024-04-03 DIAGNOSIS — I252 Old myocardial infarction: Secondary | ICD-10-CM | POA: Diagnosis not present

## 2024-04-03 DIAGNOSIS — Z955 Presence of coronary angioplasty implant and graft: Secondary | ICD-10-CM | POA: Diagnosis not present

## 2024-04-06 ENCOUNTER — Encounter (HOSPITAL_COMMUNITY)
Admission: RE | Admit: 2024-04-06 | Discharge: 2024-04-06 | Disposition: A | Discharge status: Home / Self Care | Source: Ambulatory Visit | Attending: Internal Medicine

## 2024-04-06 DIAGNOSIS — Z48812 Encounter for surgical aftercare following surgery on the circulatory system: Secondary | ICD-10-CM | POA: Diagnosis not present

## 2024-04-06 DIAGNOSIS — I252 Old myocardial infarction: Secondary | ICD-10-CM | POA: Diagnosis not present

## 2024-04-06 DIAGNOSIS — I2111 ST elevation (STEMI) myocardial infarction involving right coronary artery: Secondary | ICD-10-CM

## 2024-04-06 DIAGNOSIS — Z955 Presence of coronary angioplasty implant and graft: Secondary | ICD-10-CM

## 2024-04-06 NOTE — Progress Notes (Signed)
 Cardiac Individual Treatment Plan  Patient Details  Name: Anne Barnes MRN: [id number] Date of Birth: 05-Mar-1946 Referring Provider:   Flowsheet Row INTENSIVE CARDIAC REHAB ORIENT from 03/03/2024 in [facility] for [facility], V[facility], & [facility]  Referring Provider Santo Stanly DELENA, MD    Initial Encounter Date:  Flowsheet Row INTENSIVE CARDIAC REHAB ORIENT from 03/03/2024 in [facility] for Heart, Vascular, & Lung Health  Date 03/03/24    Visit Diagnosis: 01/30/24 S/P PCI/DES RCA  01/30/24 STEMI  Patient's Home Medications on Admission:  Current Outpatient Medications:    albuterol  (VENTOLIN  HFA) 108 (90 Base) MCG/ACT inhaler, INHALE 2 PUFFS BY MOUTH 2 puffs every 4-6 hours as needed for cough/wheeze/shortness of breath/chest, Disp: 18 g, Rfl: 1   alendronate  (FOSAMAX ) 70 MG tablet, Take 1 tablet (70 mg total) by mouth once a week. Take with full glass of water on an empty stomach, Disp: 12 tablet, Rfl: 3   ALPRAZolam  (XANAX ) 0.5 MG tablet, TAKE 1 TABLET BY MOUTH TWICE DAILY AS NEEDED FOR ANXIETY AND FOR SLEEP (Patient taking differently: Take 0.25 mg by mouth 2 (two) times daily as needed for anxiety or sleep.), Disp: 20 tablet, Rfl: 0   amLODipine  (NORVASC ) 2.5 MG tablet, Take 1 tablet (2.5 mg total) by mouth daily., Disp: 30 tablet, Rfl: 3   aspirin  EC 81 MG tablet, Take 1 tablet (81 mg total) by mouth daily. Swallow whole., Disp: 30 tablet, Rfl: 3   azelastine  (ASTELIN ) 0.1 % nasal spray, Place 2 sprays into both nostrils 2 (two) times daily as needed for rhinitis., Disp: 30 mL, Rfl: 5   azelastine  (OPTIVAR ) 0.05 % ophthalmic solution, Place 1 drop into both eyes 2 (two) times daily as needed., Disp: 6 mL, Rfl: 5   b complex vitamins tablet, Take 1 tablet by mouth daily., Disp: , Rfl:    Cholecalciferol (VITAMIN D ) 50 MCG (2000 UT) tablet, Take 2,000 Units by mouth daily., Disp: , Rfl:    clopidogrel  (PLAVIX ) 75 MG tablet,  Take 1 tablet (75 mg total) by mouth daily with breakfast., Disp: 90 tablet, Rfl: 3   empagliflozin  (JARDIANCE ) 10 MG TABS tablet, Take 1 tablet (10 mg total) by mouth daily before breakfast., Disp: 90 tablet, Rfl: 3   EPINEPHrine  0.3 mg/0.3 mL IJ SOAJ injection, Inject 0.3 mg into the muscle as needed for anaphylaxis., Disp: 2 each, Rfl: 2   fluticasone  (FLONASE ) 50 MCG/ACT nasal spray, Use 1-2 sprays in each nostril once  a day as needed for stuffy nose. In the right nostril, point the applicator out toward the right ear. In the left nostril, point the applicator out toward the left ear, Disp: 16 g, Rfl: 5   Fluticasone  Propionate (XHANCE  NA), Place 2 sprays into the nose 2 (two) times daily as needed., Disp: , Rfl:    furosemide  (LASIX ) 20 MG tablet, Take 1 tablet (20 mg total) by mouth 3 (three) times a week. Monday, Wednesday and Friday., Disp: 90 tablet, Rfl: 3   levocetirizine (XYZAL ) 5 MG tablet, Take 1 tablet (5 mg total) by mouth every evening., Disp: 30 tablet, Rfl: 5   MAGNESIUM  CITRATE PO, Take 1 tablet by mouth daily., Disp: , Rfl:    montelukast  (SINGULAIR ) 10 MG tablet, Take 1 tablet (10 mg total) by mouth at bedtime., Disp: 30 tablet, Rfl: 5   Multiple Vitamin (MULTIVITAMIN) tablet, Take 1 tablet by mouth daily., Disp: , Rfl:    nitroGLYCERIN  (NITROSTAT ) 0.4 MG SL tablet,  Place 1 tablet (0.4 mg total) under the tongue every 5 (five) minutes as needed for chest pain., Disp: 25 tablet, Rfl: 1   pantoprazole  (PROTONIX ) 40 MG tablet, Take 1 tablet (40 mg total) by mouth daily., Disp: 90 tablet, Rfl: 3   Pseudoephedrine-Ibuprofen (ADVIL COLD/SINUS PO), Take 2 tablets by mouth as needed., Disp: , Rfl:    rosuvastatin  (CRESTOR ) 20 MG tablet, Take 1 tablet (20 mg total) by mouth daily., Disp: 90 tablet, Rfl: 3   spironolactone  (ALDACTONE ) 25 MG tablet, Take 1 tablet (25 mg total) by mouth daily., Disp: 90 tablet, Rfl: 2   UNABLE TO FIND, Take 2 capsules by mouth daily. Fish Oil w/ tumeric,  Disp: , Rfl:    valsartan  (DIOVAN ) 40 MG tablet, Take 1 tablet (40 mg total) by mouth daily., Disp: 90 tablet, Rfl: 3  Past Medical History: Past Medical History:  Diagnosis Date   Acute combined systolic and diastolic heart failure (HCC) 05/26/2015   ALLERGIC RHINITIS    Allergic urticaria 08/01/2014   Patient reports allergies to shrimp, egg whites, tomatoes, dairy    Allergy     seasonal and numerous food and drug allergies.   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   Office Spirometry 11/07/17-WNL-FVC 1.58/82%, FEV1 1.27/85%, ratio 0.80, FEF 25-75% 0.26/89%   Bradycardia 02/25/2017   CAD (coronary artery disease) 05/01/2023   Myoview  03/26/2017: No ischemia Non-obs dz - CCTA 10/31/2019: CAC score 127; RCA proximal and mid 25-49, LAD proximal and mid 0-25    Chiari malformation    Noted MRI brain 09/2013 - s/p neuro eval for same   DIVERTICULOSIS, COLON    Essential hypertension 08/28/2010   Qualifier: Diagnosis of  By: Wilhemina RMA, Lucy      Frequent headaches    GERD    H/O measles    H/O mumps    Hearing loss 01/18/2017   History of chicken pox    Hyperglycemia 03/05/2016   Hyperlipidemia    HYPERTENSION    Hypocalcemia 02/25/2017   IBS (irritable bowel syndrome) 05/26/2015   Nonspecific abnormal electrocardiogram (ECG) (EKG)    OBSTRUCTIVE SLEEP APNEA 12/2008 dx   noncompliant with CPAP qhs   Obstructive sleep apnea 08/28/2010   NPSG 12/2008:  AHI 13/hr with desats to 78%    Preventative health care 06/11/2017   Sciatica    right side   Seasonal and perennial allergic rhinitis 08/28/2010   Allergy  profile 03/11/14- positive especially for dust, cat and dog Food allergy  profile- total IgE 86 with several food group elevations Sed rate-WNL      Sinusitis 06/06/2017   Sleep apnea    uses cpap   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    Labs: Review Flowsheet  More data exists      Latest Ref Rng & Units 03/05/2023 07/25/2023 01/23/2024 01/30/2024 01/31/2024  Labs for ITP Cardiac and Pulmonary Rehab  Cholestrol 0 - 200 mg/dL 888  873  840  - 877   LDL (calc) 0 - 99 mg/dL 60  75  898  - 72   HDL-C >40 mg/dL 58.39  59.49  52.69  - 42   Trlycerides <150 mg/dL 55.9  51.9  48.9  - 40   Hemoglobin A1c 4.8 - 5.6 % 5.6  5.4  5.6  -  5.1   TCO2 22 - 32 mmol/L - - - 27  -    Capillary Blood Glucose: Lab Results  Component Value Date   GLUCAP 117 (H) 03/03/2024   GLUCAP 136 (H) 01/31/2024   GLUCAP 98 01/31/2024   GLUCAP 111 (H) 01/31/2024   GLUCAP 96 01/30/2024     Exercise Target Goals: Exercise Program Goal: Individual exercise prescription set using results from initial 6 min walk test and THRR while considering  patient's activity barriers and safety.   Exercise Prescription Goal: Initial exercise prescription builds to 30-45 minutes a day of aerobic activity, 2-3 days per week.  Home exercise guidelines will be given to patient during program as part of exercise prescription that the participant will acknowledge.  Activity Barriers & Risk Stratification:  Activity Barriers & Cardiac Risk Stratification - 03/03/24 1144       Activity Barriers & Cardiac Risk Stratification   Activity Barriers Balance Concerns;History of Falls;Assistive Device;Other (comment)    Comments Possible neuropathy in legs/ feet.    Cardiac Risk Stratification High          6 Minute Walk:  6 Minute Walk     Row Name 03/03/24 1200         6 Minute Walk   Phase Initial     Distance 720 feet     Walk Time 6 minutes     # of Rest Breaks 0     MPH 1.36     METS 0.91     RPE 13     Perceived Dyspnea  2     VO2 Peak 3.18     Symptoms Yes (comment)     Comments Mild shortness of breath. Left leg and foot weakness.     Resting HR 75 bpm     Resting BP 134/78     Resting Oxygen  Saturation  97 %     Exercise Oxygen Saturation  during 6 min walk 96 %     Max Ex. HR 102 bpm     Max Ex. BP 148/68     2 Minute Post BP 150/72        Oxygen Initial Assessment:   Oxygen Re-Evaluation:   Oxygen Discharge (Final Oxygen Re-Evaluation):   Initial Exercise Prescription:  Initial Exercise Prescription - 03/03/24 1300       Date of Initial Exercise RX and Referring Provider   Date 03/03/24    Referring Provider Santo Stanly LABOR, MD    Expected Discharge Date 05/27/24      NuStep   Level 1    SPM 75    Minutes 15    METs 1.5      Prescription Details   Frequency (times per week) 3    Duration Progress to 30 minutes of continuous aerobic without signs/symptoms of physical distress      Intensity   THRR 40-80% of Max Heartrate 58-115    Ratings of Perceived Exertion 11-13    Perceived Dyspnea 0-4      Progression   Progression Continue to progress workloads to maintain intensity without signs/symptoms of physical distress.      Resistance Training   Training Prescription Yes    Weight 2 lbs    Reps 10-15          Perform Capillary Blood Glucose checks as needed.  Exercise Prescription Changes:   Exercise Prescription Changes     Row Name 03/09/24 1037 03/23/24 1033 04/06/24 1031  Response to Exercise   Blood Pressure (Admit) 140/78 130/70 144/60     Blood Pressure (Exercise) 156/70 130/70 --     Blood Pressure (Exit) 124/70 122/62 124/70     Heart Rate (Admit) 75 bpm 77 bpm 69 bpm     Heart Rate (Exercise) 104 bpm 109 bpm 105 bpm     Heart Rate (Exit) 84 bpm 86 bpm 78 bpm     Rating of Perceived Exertion (Exercise) 9 11 8      Symptoms None None None     Comments Off to a good start with exercise. -- --     Duration Continue with 30 min of aerobic exercise without signs/symptoms of physical distress. Continue with 30 min of aerobic exercise without signs/symptoms of physical distress. Continue with 30 min of aerobic  exercise without signs/symptoms of physical distress.     Intensity THRR unchanged THRR unchanged THRR unchanged       Progression   Progression Continue to progress workloads to maintain intensity without signs/symptoms of physical distress. Continue to progress workloads to maintain intensity without signs/symptoms of physical distress. Continue to progress workloads to maintain intensity without signs/symptoms of physical distress.     Average METs 1.7 2.1 2       Resistance Training   Training Prescription Yes Yes Yes     Weight 2 lbs 2 lbs 2 lbs     Reps 10-15 10-15 10-15     Time 5 Minutes 5 Minutes 5 Minutes       Interval Training   Interval Training No No No       NuStep   Level 2 2 2      SPM 78 115 117     Minutes 30 30 30      METs 1.7 2.1 2       Home Exercise Plan   Plans to continue exercise at -- -- Home (comment)  Walking     Frequency -- -- Add 3 additional days to program exercise sessions.     Initial Home Exercises Provided -- -- 03/25/24        Exercise Comments:   Exercise Comments     Row Name 03/09/24 1135 03/25/24 1103         Exercise Comments Nathifa tolerated low intensity exercise well without symptoms. Oriented her to the exercise equipment and stretching routine. Reviewed home exercise guidelines, MET, and goals with [place].         Exercise Goals and Review:   Exercise Goals     Row Name 03/03/24 1144             Exercise Goals   Increase Physical Activity Yes       Intervention Provide advice, education, support and counseling about physical activity/exercise needs.;Develop an individualized exercise prescription for aerobic and resistive training based on initial evaluation findings, risk stratification, comorbidities and participant's personal goals.       Expected Outcomes Short Term: Attend rehab on a regular basis to increase amount of physical activity.;Long Term: Exercising regularly at least 3-5 days a week.;Long Term: Add  in home exercise to make exercise part of routine and to increase amount of physical activity.       Increase Strength and Stamina Yes       Intervention Provide advice, education, support and counseling about physical activity/exercise needs.;Develop an individualized exercise prescription for aerobic and resistive training based on initial evaluation findings, risk stratification, comorbidities and participant's personal goals.  Expected Outcomes Short Term: Increase workloads from initial exercise prescription for resistance, speed, and METs.;Short Term: Perform resistance training exercises routinely during rehab and add in resistance training at home;Long Term: Improve cardiorespiratory fitness, muscular endurance and strength as measured by increased METs and functional capacity ( )       Able to understand and use rate of perceived exertion (RPE) scale Yes       Intervention Provide education and explanation on how to use RPE scale       Expected Outcomes Short Term: Able to use RPE daily in rehab to express subjective intensity level;Long Term:  Able to use RPE to guide intensity level when exercising independently       Knowledge and understanding of Target Heart Rate Range (THRR) Yes       Intervention Provide education and explanation of THRR including how the numbers were predicted and where they are located for reference       Expected Outcomes Short Term: Able to state/look up THRR;Long Term: Able to use THRR to govern intensity when exercising independently;Short Term: Able to use daily as guideline for intensity in rehab       Able to check pulse independently Yes       Intervention Provide education and demonstration on how to check pulse in carotid and radial arteries.;Review the importance of being able to check your own pulse for safety during independent exercise       Expected Outcomes Short Term: Able to explain why pulse checking is important during independent  exercise;Long Term: Able to check pulse independently and accurately       Understanding of Exercise Prescription Yes       Intervention Provide education, explanation, and written materials on patient's individual exercise prescription       Expected Outcomes Short Term: Able to explain program exercise prescription;Long Term: Able to explain home exercise prescription to exercise independently          Exercise Goals Re-Evaluation :  Exercise Goals Re-Evaluation     Row Name 03/09/24 1135 03/25/24 1103           Exercise Goal Re-Evaluation   Exercise Goals Review Increase Physical Activity;Increase Strength and Stamina;Able to understand and use rate of perceived exertion (RPE) scale Increase Physical Activity;Increase Strength and Stamina;Able to understand and use rate of perceived exertion (RPE) scale;Understanding of Exercise Prescription;Able to check pulse independently;Knowledge and understanding of Target Heart Rate Range (THRR)      Comments Valentina was able to understand and use RPE scale appropriately. Reviewed exercise prescription with [place]. She is walking in the park using her rollator and stretching. She walks 30 minutes, 3 days/week. She has a treadmill at home that she uses when it's too cold to walk outside.      Expected Outcomes Progress workloads as tolerated to help improve cardiorespiratory fitness. Lezley will continue walking at home in addition to exercise at cardiac rehab.         Discharge Exercise Prescription (Final Exercise Prescription Changes):  Exercise Prescription Changes - 04/06/24 1031       Response to Exercise   Blood Pressure (Admit) 144/60    Blood Pressure (Exit) 124/70    Heart Rate (Admit) 69 bpm    Heart Rate (Exercise) 105 bpm    Heart Rate (Exit) 78 bpm    Rating of Perceived Exertion (Exercise) 8    Symptoms None    Duration Continue with 30 min of aerobic exercise without signs/symptoms of  physical distress.    Intensity THRR  unchanged      Progression   Progression Continue to progress workloads to maintain intensity without signs/symptoms of physical distress.    Average METs 2      Resistance Training   Training Prescription Yes    Weight 2 lbs    Reps 10-15    Time 5 Minutes      Interval Training   Interval Training No      NuStep   Level 2    SPM 117    Minutes 30    METs 2      Home Exercise Plan   Plans to continue exercise at Home (comment)   Walking   Frequency Add 3 additional days to program exercise sessions.    Initial Home Exercises Provided 03/25/24          Nutrition:  Target Goals: Understanding of nutrition guidelines, daily intake of sodium 1500mg , cholesterol 200mg , calories 30% from fat and 7% or less from saturated fats, daily to have 5 or more servings of fruits and vegetables.  Biometrics:  Pre Biometrics - 03/03/24 1058       Pre Biometrics   Waist Circumference 51.25 inches    Hip Circumference 53.25 inches    Waist to Hip Ratio 0.96 %    Triceps Skinfold 52 mm    % Body Fat 57.3 %    Grip Strength 8 kg    Flexibility 0 in    Single Leg Stand 2.87 seconds           Nutrition Therapy Plan and Nutrition Goals:  Nutrition Therapy & Goals - 04/06/24 1128       Nutrition Therapy   Diet Heart healthy Diet    Drug/Food Interactions Statins/Certain Fruits      Personal Nutrition Goals   Nutrition Goal Patient to identify strategies for reducing cardiovascular risk by attending the [facility] education and nutrition series weekly.   goal not met.   Personal Goal #2 Patient to improve diet quality by using the plate method as a guide for meal planning to include lean protein/plant protein, fruits, vegetables, whole grains, nonfat dairy as part of a well-balanced diet.   goal in progress.   Comments Patient has medical history of STEMI, HFpEF, hyperlipidemia.She does not regularly attend the [facility] education/nutrition series.  LDL is 72 with ideal goal <44.  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 She is up  3.7# since starting with our program. Patient will benefit from participation in intensive cardiac rehab for nutrition, exercise, and lifestyle modification.      Intervention Plan   Intervention Prescribe, educate and counsel regarding individualized specific dietary modifications aiming towards targeted core components such as weight, hypertension, lipid management, diabetes, heart failure and other comorbidities.;Nutrition handout(s) given to patient.    Expected Outcomes Short Term Goal: Understand basic principles of dietary content, such as calories, fat, sodium, cholesterol and nutrients.;Long Term Goal: Adherence to prescribed nutrition plan.          Nutrition Assessments:  Nutrition Assessments - 03/09/24 1147       Rate Your Plate Scores   Pre Score 73         MEDIFICTS Score Key: >=70 Need to make dietary changes  40-70 Heart Healthy Diet <= 40 Therapeutic Level Cholesterol Diet   Flowsheet Row INTENSIVE CARDIAC REHAB from 03/09/2024 in [facility] Heart,  Vascular, & Lung Health  Picture Your Plate Total Score on Admission 73   Picture Your Plate Scores: <59 Unhealthy dietary pattern with much room for improvement. 41-50 Dietary pattern unlikely to meet recommendations for good health and room for improvement. 51-60 More healthful dietary pattern, with some room for improvement.  >60 Healthy dietary pattern, although there may be some specific behaviors that could be improved.    Nutrition Goals Re-Evaluation:  Nutrition Goals Re-Evaluation     Row Name 03/09/24 1103 04/06/24 1128           Goals   Current Weight 218 lb 14.7 oz (99.3 kg) 222 lb 10.6 oz (101 kg)      Comment A1c WNL, LDL 72 (goals <70 but ideally <55), HDL 42 A1c WNL, LDL 72 (goals <70 but ideally <55), HDL 42      Expected  Outcome Patient has medical history of STEMI, HFpEF, hyperlipidemia. LDL is 72 with ideal goal <44. 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 Patient will benefit from participation in intensive cardiac rehab for nutrition, exercise, and lifestyle modification. Patient has medical history of STEMI, HFpEF, hyperlipidemia.She does not regularly attend the [facility] education/nutrition series. LDL is 72 with ideal goal <44. 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 She is up 3.7# since starting with our program. Patient will benefit from participation in intensive cardiac rehab for nutrition, exercise, and lifestyle modification.         Nutrition Goals Re-Evaluation:  Nutrition Goals Re-Evaluation     Row Name 03/09/24 1103 04/06/24 1128           Goals   Current Weight 218 lb 14.7 oz (99.3 kg) 222 lb 10.6 oz (101 kg)      Comment A1c WNL, LDL 72 (goals <70 but ideally <55), HDL 42 A1c WNL, LDL 72 (goals <70 but ideally <55), HDL 42      Expected Outcome Patient has medical history of STEMI, HFpEF, hyperlipidemia. LDL is 72 with ideal goal <44. 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 Patient will benefit from participation in intensive cardiac rehab for nutrition, exercise, and lifestyle modification. Patient has medical history of STEMI, HFpEF, hyperlipidemia.She does not regularly attend the [facility] education/nutrition series. LDL is 72 with ideal goal <44. 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 She is up 3.7# since starting with our program. Patient will benefit from participation in intensive cardiac rehab for nutrition,  exercise, and lifestyle modification.         Nutrition Goals Discharge (Final Nutrition Goals Re-Evaluation):  Nutrition Goals Re-Evaluation - 04/06/24 1128       Goals   Current Weight 222 lb 10.6 oz (101 kg)    Comment A1c WNL, LDL 72 (goals <70 but ideally <55), HDL 42    Expected Outcome Patient has medical history of STEMI, HFpEF, hyperlipidemia.She does not regularly attend the [facility] education/nutrition series. LDL is 72 with ideal goal <44. 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 She is up 3.7# since starting with our program. Patient will benefit from participation in intensive cardiac rehab for nutrition, exercise, and lifestyle modification.          Psychosocial: Target  Goals: Acknowledge presence or absence of significant depression and/or stress, maximize coping skills, provide positive support system. Participant is able to verbalize types and ability to use techniques and skills needed for reducing stress and depression.  Initial Review & Psychosocial Screening:  Initial Psych Review & Screening - 03/03/24 1100       Initial Review   Current issues with History of Depression      Family Dynamics   Good Support System? Yes   Kristena live with her daughter and her 2 sons   Comments Laelynn lost her husband 4 years ago and her daughter 2 years ago from ALS she was 58 years old      Barriers   Psychosocial barriers to participate in program The patient should benefit from training in stress management and relaxation.      Screening Interventions   Interventions Encouraged to exercise;To provide support and resources with identified psychosocial needs;Provide feedback about the scores to participant    Expected Outcomes Short Term goal: Utilizing psychosocial counselor, staff and physician to assist with identification of specific Stressors or current issues interfering with  healing process. Setting desired goal for each stressor or current issue identified.;Long Term Goal: Stressors or current issues are controlled or eliminated.;Short Term goal: Identification and review with participant of any Quality of Life or Depression concerns found by scoring the questionnaire.;Long Term goal: The participant improves quality of Life and PHQ9 Scores as seen by post scores and/or verbalization of changes          Quality of Life Scores:  Quality of Life - 03/03/24 1402       Quality of Life   Select Quality of Life      Quality of Life Scores   Health/Function Pre 25.03 %    Socioeconomic Pre 26.29 %    Psych/Spiritual Pre 28.21 %    Family Pre 30 %    GLOBAL Pre 26.58 %         Scores of 19 and below usually indicate a poorer quality of life in these areas.  A difference of  2-3 points is a clinically meaningful difference.  A difference of 2-3 points in the total score of the Quality of Life Index has been associated with significant improvement in overall quality of life, self-image, physical symptoms, and general health in studies assessing change in quality of life.  PHQ-9: Review Flowsheet  More data exists      03/03/2024 02/05/2024 07/25/2023 03/05/2023 10/23/2022  Depression screen PHQ 2/9  Decreased Interest 0 0 0 0 0  Down, Depressed, Hopeless 0 0 0 0 0  PHQ - 2 Score 0 0 0 0 0  Altered sleeping 2 - - 0 -  Tired, decreased energy 2 - - 0 -  Change in appetite 0 - - 0 -  Feeling bad or failure about yourself  0 - - 0 -  Trouble concentrating 0 - - 0 -  Moving slowly or fidgety/restless 0 - - 0 -  Suicidal thoughts 0 - - 0 -  PHQ-9 Score 4 - - 0 -  Difficult doing work/chores Somewhat difficult - - Not difficult at all -   Interpretation of Total Score  Total Score Depression Severity:  1-4 = Minimal depression, 5-9 = Mild depression, 10-14 = Moderate depression, 15-19 = Moderately severe depression, 20-27 = Severe depression   Psychosocial  Evaluation and Intervention:   Psychosocial Re-Evaluation:  Psychosocial Re-Evaluation     Row Name 03/09/24 [id number]  03/31/24 1424           Psychosocial Re-Evaluation   Current issues with History of Depression;Current Sleep Concerns History of Depression;Current Sleep Concerns      Comments Calista did not voice any increased concerns or stressors. Vickye did says she has trouble sleeping at orientation and sleeps in her recliner. Caitland has not  not voiced any increased concerns or stressors during exercise at cardiac rehab. Anna-Marie says she is enjoying participating in the porgram.      Expected Outcomes -- Jaysie will have controlled or decreased depression upon completion of cardiac rehab      Interventions Stress management education;Encouraged to attend Cardiac Rehabilitation for the exercise;Relaxation education Stress management education;Encouraged to attend Cardiac Rehabilitation for the exercise;Relaxation education      Continue Psychosocial Services  Follow up required by staff Follow up required by staff         Psychosocial Discharge (Final Psychosocial Re-Evaluation):  Psychosocial Re-Evaluation - 03/31/24 1424       Psychosocial Re-Evaluation   Current issues with History of Depression;Current Sleep Concerns    Comments Trinia has not  not voiced any increased concerns or stressors during exercise at cardiac rehab. Haya says she is enjoying participating in the porgram.    Expected Outcomes Lauran will have controlled or decreased depression upon completion of cardiac rehab    Interventions Stress management education;Encouraged to attend Cardiac Rehabilitation for the exercise;Relaxation education    Continue Psychosocial Services  Follow up required by staff          Vocational Rehabilitation: Provide vocational rehab assistance to qualifying candidates.   Vocational Rehab Evaluation & Intervention:  Vocational Rehab - 03/03/24 1206       Initial Vocational  Rehab Evaluation & Intervention   Assessment shows need for Vocational Rehabilitation No   Josselin is retired and does not need vocational rehab at this time         Education: Education Goals: Education classes will be provided on a weekly basis, covering required topics. Participant will state understanding/return demonstration of topics presented.    Education     Row Name 03/09/24 1400     Education   Cardiac Education Topics Pritikin   Geographical information systems officer Psychosocial   Psychosocial Workshop Focused Goals, Sustainable Changes   Instruction Review Code 1- Verbalizes Understanding   Class Start Time 1155   Class Stop Time 1235   Class Time Calculation (min) 40 min    Row Name 03/11/24 1300     Education   Cardiac Education Topics Pritikin   Customer service manager   Weekly Topic Comforting Weekend Breakfasts   Instruction Review Code 1- Verbalizes Understanding   Class Start Time 1145   Class Stop Time 1220   Class Time Calculation (min) 35 min    Row Name 03/13/24 1100     Education   Cardiac Education Topics Pritikin   Select Core Videos     Core Videos   Educator Exercise Physiologist   Select Nutrition   Nutrition Dining Out - Part 1   Instruction Review Code 1- Verbalizes Understanding   Class Start Time 1147   Class Stop Time 1226   Class Time Calculation (min) 39 min    Row Name 03/16/24 1100     Education   Cardiac Education Topics Pritikin   Psychologist, sport and exercise  Core Videos   Educator Exercise Physiologist   Select Exercise Education   Exercise Education Biomechanial Limitations   Instruction Review Code 1- Verbalizes Understanding   Class Start Time 1155   Class Stop Time 1235   Class Time Calculation (min) 40 min    Row Name 03/20/24 1100     Education   Cardiac Education Topics --   Select --     Core Videos   Educator --   Select --    Nutrition --   Instruction Review Code --    Row Name 03/23/24 1600     Education   Cardiac Education Topics Pritikin   Glass blower/designer Nutrition   Nutrition Workshop Fueling a Forensic psychologist   Instruction Review Code 1- TEFL teacher Understanding   Class Start Time 1145   Class Stop Time 1230   Class Time Calculation (min) 45 min    Row Name 03/30/24 1300     Education   Cardiac Education Topics Pritikin   Geographical information systems officer Psychosocial   Psychosocial Workshop Healthy Sleep for a Healthy Heart   Instruction Review Code 1- Verbalizes Understanding   Class Start Time 1150   Class Stop Time 1230   Class Time Calculation (min) 40 min    Row Name 04/01/24 1100     Education   Cardiac Education Topics Pritikin   Customer service manager   Weekly Topic Simple Sides and Sauces   Instruction Review Code 1- Verbalizes Understanding   Class Start Time 1145   Class Stop Time 1225   Class Time Calculation (min) 40 min    Row Name 04/03/24 1100     Education   Cardiac Education Topics Pritikin   Nurse, children's Exercise Physiologist   Select Psychosocial   Psychosocial How Our Thoughts Can Heal Our Hearts   Instruction Review Code 1- Verbalizes Understanding   Class Start Time 1153   Class Stop Time 1241   Class Time Calculation (min) 48 min    Row Name 04/06/24 1100     Education   Cardiac Education Topics Pritikin   Hospital doctor Education   General Education Hypertension and Heart Disease   Instruction Review Code 1- Verbalizes Understanding   Class Start Time 1150   Class Stop Time 1225   Class Time Calculation (min) 35 min      Core Videos: Exercise    Move It!  Clinical staff conducted group or individual  video education with verbal and written material and guidebook.  Patient learns the recommended Pritikin exercise program. Exercise with the goal of living a long, healthy life. Some of the health benefits of exercise include controlled diabetes, healthier blood pressure levels, improved cholesterol levels, improved heart and lung capacity, improved sleep, and better body composition. Everyone should speak with their doctor before starting or changing an exercise routine.  Biomechanical Limitations Clinical staff conducted group or individual video education with verbal and written material and guidebook.  Patient learns how biomechanical limitations can impact exercise and how we can mitigate and possibly overcome limitations to have an impactful and balanced exercise routine.  Body Composition Clinical staff conducted group or individual  video education with verbal and written material and guidebook.  Patient learns that body composition (ratio of muscle mass to fat mass) is a key component to assessing overall fitness, rather than body weight alone. Increased fat mass, especially visceral belly fat, can put us  at increased risk for metabolic syndrome, type 2 diabetes, heart disease, and even death. It is recommended to combine diet and exercise (cardiovascular and resistance training) to improve your body composition. Seek guidance from your physician and exercise physiologist before implementing an exercise routine.  Exercise Action Plan Clinical staff conducted group or individual video education with verbal and written material and guidebook.  Patient learns the recommended strategies to achieve and enjoy long-term exercise adherence, including variety, self-motivation, self-efficacy, and positive decision making. Benefits of exercise include fitness, good health, weight management, more energy, better sleep, less stress, and overall well-being.  Medical   Heart Disease Risk Reduction Clinical  staff conducted group or individual video education with verbal and written material and guidebook.  Patient learns our heart is our most vital organ as it circulates oxygen, nutrients, white blood cells, and hormones throughout the entire body, and carries waste away. Data supports a plant-based eating plan like the Pritikin Program for its effectiveness in slowing progression of and reversing heart disease. The video provides a number of recommendations to address heart disease.   Metabolic Syndrome and Belly Fat  Clinical staff conducted group or individual video education with verbal and written material and guidebook.  Patient learns what metabolic syndrome is, how it leads to heart disease, and how one can reverse it and keep it from coming back. You have metabolic syndrome if you have 3 of the following 5 criteria: abdominal obesity, high blood pressure, high triglycerides, low HDL cholesterol, and high blood sugar.  Hypertension and Heart Disease Clinical staff conducted group or individual video education with verbal and written material and guidebook.  Patient learns that high blood pressure, or hypertension, is very common in the [place] . Hypertension is largely due to excessive salt intake, but other important risk factors include being overweight, physical inactivity, drinking too much alcohol, smoking, and not eating enough potassium from fruits and vegetables. High blood pressure is a leading risk factor for heart attack, stroke, congestive heart failure, dementia, kidney failure, and premature death. Long-term effects of excessive salt intake include stiffening of the arteries and thickening of heart muscle and organ damage. Recommendations include ways to reduce hypertension and the risk of heart disease.  Diseases of Our Time - Focusing on Diabetes Clinical staff conducted group or individual video education with verbal and written material and guidebook.  Patient learns why the  best way to stop diseases of our time is prevention, through food and other lifestyle changes. Medicine (such as prescription pills and surgeries) is often only a Band-Aid on the problem, not a long-term solution. Most common diseases of our time include obesity, type 2 diabetes, hypertension, heart disease, and cancer. The Pritikin Program is recommended and has been proven to help reduce, reverse, and/or prevent the damaging effects of metabolic syndrome.  Nutrition   Overview of the Pritikin Eating Plan  Clinical staff conducted group or individual video education with verbal and written material and guidebook.  Patient learns about the Pritikin Eating Plan for disease risk reduction. The Pritikin Eating Plan emphasizes a wide variety of unrefined, minimally-processed carbohydrates, like fruits, vegetables, whole grains, and legumes. Go, Caution, and Stop food choices are explained. Plant-based and lean animal proteins are emphasized.  Rationale provided for low sodium intake for blood pressure control, low added sugars for blood sugar stabilization, and low added fats and oils for coronary artery disease risk reduction and weight management.  Calorie Density  Clinical staff conducted group or individual video education with verbal and written material and guidebook.  Patient learns about calorie density and how it impacts the Pritikin Eating Plan. Knowing the characteristics of the food you choose will help you decide whether those foods will lead to weight gain or weight loss, and whether you want to consume more or less of them. Weight loss is usually a side effect of the Pritikin Eating Plan because of its focus on low calorie-dense foods.  Label Reading  Clinical staff conducted group or individual video education with verbal and written material and guidebook.  Patient learns about the Pritikin recommended label reading guidelines and corresponding recommendations regarding calorie density,  added sugars, sodium content, and whole grains.  Dining Out - Part 1  Clinical staff conducted group or individual video education with verbal and written material and guidebook.  Patient learns that restaurant meals can be sabotaging because they can be so high in calories, fat, sodium, and/or sugar. Patient learns recommended strategies on how to positively address this and avoid unhealthy pitfalls.  Facts on Fats  Clinical staff conducted group or individual video education with verbal and written material and guidebook.  Patient learns that lifestyle modifications can be just as effective, if not more so, as many medications for lowering your risk of heart disease. A Pritikin lifestyle can help to reduce your risk of inflammation and atherosclerosis (cholesterol build-up, or plaque, in the artery walls). Lifestyle interventions such as dietary choices and physical activity address the cause of atherosclerosis. A review of the types of fats and their impact on blood cholesterol levels, along with dietary recommendations to reduce fat intake is also included.  Nutrition Action Plan  Clinical staff conducted group or individual video education with verbal and written material and guidebook.  Patient learns how to incorporate Pritikin recommendations into their lifestyle. Recommendations include planning and keeping personal health goals in mind as an important part of their success.  Healthy Mind-Set    Healthy Minds, Bodies, Hearts  Clinical staff conducted group or individual video education with verbal and written material and guidebook.  Patient learns how to identify when they are stressed. Video will discuss the impact of that stress, as well as the many benefits of stress management. Patient will also be introduced to stress management techniques. The way we think, act, and feel has an impact on our hearts.  How Our Thoughts Can Heal Our Hearts  Clinical staff conducted group or  individual video education with verbal and written material and guidebook.  Patient learns that negative thoughts can cause depression and anxiety. This can result in negative lifestyle behavior and serious health problems. Cognitive behavioral therapy is an effective method to help control our thoughts in order to change and improve our emotional outlook.  Additional Videos:  Exercise    Improving Performance  Clinical staff conducted group or individual video education with verbal and written material and guidebook.  Patient learns to use a non-linear approach by alternating intensity levels and lengths of time spent exercising to help burn more calories and lose more body fat. Cardiovascular exercise helps improve heart health, metabolism, hormonal balance, blood sugar control, and recovery from fatigue. Resistance training improves strength, endurance, balance, coordination, reaction time, metabolism, and muscle mass. Flexibility exercise  improves circulation, posture, and balance. Seek guidance from your physician and exercise physiologist before implementing an exercise routine and learn your capabilities and proper form for all exercise.  Introduction to Yoga  Clinical staff conducted group or individual video education with verbal and written material and guidebook.  Patient learns about yoga, a discipline of the coming together of mind, breath, and body. The benefits of yoga include improved flexibility, improved range of motion, better posture and core strength, increased lung function, weight loss, and positive self-image. Yoga's heart health benefits include lowered blood pressure, healthier heart rate, decreased cholesterol and triglyceride levels, improved immune function, and reduced stress. Seek guidance from your physician and exercise physiologist before implementing an exercise routine and learn your capabilities and proper form for all exercise.  Medical   Aging: Enhancing Your  Quality of Life  Clinical staff conducted group or individual video education with verbal and written material and guidebook.  Patient learns key strategies and recommendations to stay in good physical health and enhance quality of life, such as prevention strategies, having an advocate, securing a Health Care Proxy and Power of Attorney, and keeping a list of medications and system for tracking them. It also discusses how to avoid risk for bone loss.  Biology of Weight Control  Clinical staff conducted group or individual video education with verbal and written material and guidebook.  Patient learns that weight gain occurs because we consume more calories than we burn (eating more, moving less). Even if your body weight is normal, you may have higher ratios of fat compared to muscle mass. Too much body fat puts you at increased risk for cardiovascular disease, heart attack, stroke, type 2 diabetes, and obesity-related cancers. In addition to exercise, following the Pritikin Eating Plan can help reduce your risk.  Decoding Lab Results  Clinical staff conducted group or individual video education with verbal and written material and guidebook.  Patient learns that lab test reflects one measurement whose values change over time and are influenced by many factors, including medication, stress, sleep, exercise, food, hydration, pre-existing medical conditions, and more. It is recommended to use the knowledge from this video to become more involved with your lab results and evaluate your numbers to speak with your doctor.   Diseases of Our Time - Overview  Clinical staff conducted group or individual video education with verbal and written material and guidebook.  Patient learns that according to the CDC, 50% to 70% of chronic diseases (such as obesity, type 2 diabetes, elevated lipids, hypertension, and heart disease) are avoidable through lifestyle improvements including healthier food choices,  listening to satiety cues, and increased physical activity.  Sleep Disorders Clinical staff conducted group or individual video education with verbal and written material and guidebook.  Patient learns how good quality and duration of sleep are important to overall health and well-being. Patient also learns about sleep disorders and how they impact health along with recommendations to address them, including discussing with a physician.  Nutrition  Dining Out - Part 2 Clinical staff conducted group or individual video education with verbal and written material and guidebook.  Patient learns how to plan ahead and communicate in order to maximize their dining experience in a healthy and nutritious manner. Included are recommended food choices based on the type of restaurant the patient is visiting.   Fueling a Banker conducted group or individual video education with verbal and written material and guidebook.  There is a strong connection  between our food choices and our health. Diseases like obesity and type 2 diabetes are very prevalent and are in large-part due to lifestyle choices. The Pritikin Eating Plan provides plenty of food and hunger-curbing satisfaction. It is easy to follow, affordable, and helps reduce health risks.  Menu Workshop  Clinical staff conducted group or individual video education with verbal and written material and guidebook.  Patient learns that restaurant meals can sabotage health goals because they are often packed with calories, fat, sodium, and sugar. Recommendations include strategies to plan ahead and to communicate with the manager, chef, or server to help order a healthier meal.  Planning Your Eating Strategy  Clinical staff conducted group or individual video education with verbal and written material and guidebook.  Patient learns about the Pritikin Eating Plan and its benefit of reducing the risk of disease. The Pritikin Eating Plan does  not focus on calories. Instead, it emphasizes high-quality, nutrient-rich foods. By knowing the characteristics of the foods, we choose, we can determine their calorie density and make informed decisions.  Targeting Your Nutrition Priorities  Clinical staff conducted group or individual video education with verbal and written material and guidebook.  Patient learns that lifestyle habits have a tremendous impact on disease risk and progression. This video provides eating and physical activity recommendations based on your personal health goals, such as reducing LDL cholesterol, losing weight, preventing or controlling type 2 diabetes, and reducing high blood pressure.  Vitamins and Minerals  Clinical staff conducted group or individual video education with verbal and written material and guidebook.  Patient learns different ways to obtain key vitamins and minerals, including through a recommended healthy diet. It is important to discuss all supplements you take with your doctor.   Healthy Mind-Set    Smoking Cessation  Clinical staff conducted group or individual video education with verbal and written material and guidebook.  Patient learns that cigarette smoking and tobacco addiction pose a serious health risk which affects millions of people. Stopping smoking will significantly reduce the risk of heart disease, lung disease, and many forms of cancer. Recommended strategies for quitting are covered, including working with your doctor to develop a successful plan.  Culinary   Becoming a Set designer conducted group or individual video education with verbal and written material and guidebook.  Patient learns that cooking at home can be healthy, cost-effective, quick, and puts them in control. Keys to cooking healthy recipes will include looking at your recipe, assessing your equipment needs, planning ahead, making it simple, choosing cost-effective seasonal ingredients, and  limiting the use of added fats, salts, and sugars.  Cooking - Breakfast and Snacks  Clinical staff conducted group or individual video education with verbal and written material and guidebook.  Patient learns how important breakfast is to satiety and nutrition through the entire day. Recommendations include key foods to eat during breakfast to help stabilize blood sugar levels and to prevent overeating at meals later in the day. Planning ahead is also a key component.  Cooking - Educational psychologist conducted group or individual video education with verbal and written material and guidebook.  Patient learns eating strategies to improve overall health, including an approach to cook more at home. Recommendations include thinking of animal protein as a side on your plate rather than center stage and focusing instead on lower calorie dense options like vegetables, fruits, whole grains, and plant-based proteins, such as beans. Making sauces in large quantities to freeze  for later and leaving the skin on your vegetables are also recommended to maximize your experience.  Cooking - Healthy Salads and Dressing Clinical staff conducted group or individual video education with verbal and written material and guidebook.  Patient learns that vegetables, fruits, whole grains, and legumes are the foundations of the Pritikin Eating Plan. Recommendations include how to incorporate each of these in flavorful and healthy salads, and how to create homemade salad dressings. Proper handling of ingredients is also covered. Cooking - Soups and [employer] - Soups and Desserts Clinical staff conducted group or individual video education with verbal and written material and guidebook.  Patient learns that Pritikin soups and desserts make for easy, nutritious, and delicious snacks and meal components that are low in sodium, fat, sugar, and calorie density, while high in vitamins, minerals, and filling fiber.  Recommendations include simple and healthy ideas for soups and desserts.   Overview     The Pritikin Solution Program Overview Clinical staff conducted group or individual video education with verbal and written material and guidebook.  Patient learns that the results of the Pritikin Program have been documented in more than 100 articles published in peer-reviewed journals, and the benefits include reducing risk factors for (and, in some cases, even reversing) high cholesterol, high blood pressure, type 2 diabetes, obesity, and more! An overview of the three key pillars of the Pritikin Program will be covered: eating well, doing regular exercise, and having a healthy mind-set.  WORKSHOPS  Exercise: Exercise Basics: Building Your Action Plan Clinical staff led group instruction and group discussion with PowerPoint presentation and patient guidebook. To enhance the learning environment the use of posters, models and videos may be added. At the conclusion of this workshop, patients will comprehend the difference between physical activity and exercise, as well as the benefits of incorporating both, into their routine. Patients will understand the FITT (Frequency, Intensity, Time, and Type) principle and how to use it to build an exercise action plan. In addition, safety concerns and other considerations for exercise and cardiac rehab will be addressed by the presenter. The purpose of this lesson is to promote a comprehensive and effective weekly exercise routine in order to improve patients' overall level of fitness.   Managing Heart Disease: Your Path to a Healthier Heart Clinical staff led group instruction and group discussion with PowerPoint presentation and patient guidebook. To enhance the learning environment the use of posters, models and videos may be added.At the conclusion of this workshop, patients will understand the anatomy and physiology of the heart. Additionally, they will  understand how Pritikin's three pillars impact the risk factors, the progression, and the management of heart disease.  The purpose of this lesson is to provide a high-level overview of the heart, heart disease, and how the Pritikin lifestyle positively impacts risk factors.  Exercise Biomechanics Clinical staff led group instruction and group discussion with PowerPoint presentation and patient guidebook. To enhance the learning environment the use of posters, models and videos may be added. Patients will learn how the structural parts of their bodies function and how these functions impact their daily activities, movement, and exercise. Patients will learn how to promote a neutral spine, learn how to manage pain, and identify ways to improve their physical movement in order to promote healthy living. The purpose of this lesson is to expose patients to common physical limitations that impact physical activity. Participants will learn practical ways to adapt and manage aches and pains, and to minimize  their effect on regular exercise. Patients will learn how to maintain good posture while sitting, walking, and lifting.  Balance Training and Fall Prevention  Clinical staff led group instruction and group discussion with PowerPoint presentation and patient guidebook. To enhance the learning environment the use of posters, models and videos may be added. At the conclusion of this workshop, patients will understand the importance of their sensorimotor skills (vision, proprioception, and the vestibular system) in maintaining their ability to balance as they age. Patients will apply a variety of balancing exercises that are appropriate for their current level of function. Patients will understand the common causes for poor balance, possible solutions to these problems, and ways to modify their physical environment in order to minimize their fall risk. The purpose of this lesson is to teach patients  about the importance of maintaining balance as they age and ways to minimize their risk of falling.  WORKSHOPS   Nutrition:  Fueling a Ship broker led group instruction and group discussion with PowerPoint presentation and patient guidebook. To enhance the learning environment the use of posters, models and videos may be added. Patients will review the foundational principles of the Pritikin Eating Plan and understand what constitutes a serving size in each of the food groups. Patients will also learn Pritikin-friendly foods that are better choices when away from home and review make-ahead meal and snack options. Calorie density will be reviewed and applied to three nutrition priorities: weight maintenance, weight loss, and weight gain. The purpose of this lesson is to reinforce (in a group setting) the key concepts around what patients are recommended to eat and how to apply these guidelines when away from home by planning and selecting Pritikin-friendly options. Patients will understand how calorie density may be adjusted for different weight management goals.  Mindful Eating  Clinical staff led group instruction and group discussion with PowerPoint presentation and patient guidebook. To enhance the learning environment the use of posters, models and videos may be added. Patients will briefly review the concepts of the Pritikin Eating Plan and the importance of low-calorie dense foods. The concept of mindful eating will be introduced as well as the importance of paying attention to internal hunger signals. Triggers for non-hunger eating and techniques for dealing with triggers will be explored. The purpose of this lesson is to provide patients with the opportunity to review the basic principles of the Pritikin Eating Plan, discuss the value of eating mindfully and how to measure internal cues of hunger and fullness using the Hunger Scale. Patients will also discuss reasons for non-hunger  eating and learn strategies to use for controlling emotional eating.  Targeting Your Nutrition Priorities Clinical staff led group instruction and group discussion with PowerPoint presentation and patient guidebook. To enhance the learning environment the use of posters, models and videos may be added. Patients will learn how to determine their genetic susceptibility to disease by reviewing their family history. Patients will gain insight into the importance of diet as part of an overall healthy lifestyle in mitigating the impact of genetics and other environmental insults. The purpose of this lesson is to provide patients with the opportunity to assess their personal nutrition priorities by looking at their family history, their own health history and current risk factors. Patients will also be able to discuss ways of prioritizing and modifying the Pritikin Eating Plan for their highest risk areas  Menu  Clinical staff led group instruction and group discussion with PowerPoint presentation and patient guidebook.  To enhance the learning environment the use of posters, models and videos may be added. Using menus brought in from [employer], or printed from [employer], patients will apply the Pritikin dining out guidelines that were presented in the [employer] video. Patients will also be able to practice these guidelines in a variety of provided scenarios. The purpose of this lesson is to provide patients with the opportunity to practice hands-on learning of the Pritikin Dining Out guidelines with actual menus and practice scenarios.  Label Reading Clinical staff led group instruction and group discussion with PowerPoint presentation and patient guidebook. To enhance the learning environment the use of posters, models and videos may be added. Patients will review and discuss the Pritikin label reading guidelines presented in Pritikin's Label Reading Educational series video.  Using fool labels brought in from local grocery stores and markets, patients will apply the label reading guidelines and determine if the packaged food meet the Pritikin guidelines. The purpose of this lesson is to provide patients with the opportunity to review, discuss, and practice hands-on learning of the Pritikin Label Reading guidelines with actual packaged food labels. Cooking School  Pritikin's [employer] are designed to teach patients ways to prepare quick, simple, and affordable recipes at home. The importance of nutrition's role in chronic disease risk reduction is reflected in its emphasis in the overall Pritikin program. By learning how to prepare essential core Pritikin Eating Plan recipes, patients will increase control over what they eat; be able to customize the flavor of foods without the use of added salt, sugar, or fat; and improve the quality of the food they consume. By learning a set of core recipes which are easily assembled, quickly prepared, and affordable, patients are more likely to prepare more healthy foods at home. These workshops focus on convenient breakfasts, simple entres, side dishes, and desserts which can be prepared with minimal effort and are consistent with nutrition recommendations for cardiovascular risk reduction. Cooking [employer] are taught by a Armed forces logistics/support/administrative officer (RD) who has been trained by the [employer]. The chef or RD has a clear understanding of the importance of minimizing - if not completely eliminating - added fat, sugar, and sodium in recipes. Throughout the series of Cooking School Workshop sessions, patients will learn about healthy ingredients and efficient methods of cooking to build confidence in their capability to prepare    Cooking School weekly topics:  Adding Flavor- Sodium-Free  Fast and Healthy Breakfasts  Powerhouse Plant-Based Proteins  Satisfying Salads and Dressings  Simple Sides and  Sauces  International Cuisine-Spotlight on the [employer] Zones  Delicious Desserts  Savory Soups  [employer] - Meals in a Astronomer Appetizers and Snacks  Comforting Weekend Breakfasts  One-Pot Wonders   Fast Evening Meals  Landscape architect Your Pritikin Plate  WORKSHOPS   Healthy Mindset (Psychosocial):  Focused Goals, Sustainable Changes Clinical staff led group instruction and group discussion with PowerPoint presentation and patient guidebook. To enhance the learning environment the use of posters, models and videos may be added. Patients will be able to apply effective goal setting strategies to establish at least one personal goal, and then take consistent, meaningful action toward that goal. They will learn to identify common barriers to achieving personal goals and develop strategies to overcome them. Patients will also gain an understanding of how our mind-set can impact our ability to achieve goals and the importance of cultivating a positive and  growth-oriented mind-set. The purpose of this lesson is to provide patients with a deeper understanding of how to set and achieve personal goals, as well as the tools and strategies needed to overcome common obstacles which may arise along the way.  From Head to Heart: The Power of a Healthy Outlook  Clinical staff led group instruction and group discussion with PowerPoint presentation and patient guidebook. To enhance the learning environment the use of posters, models and videos may be added. Patients will be able to recognize and describe the impact of emotions and mood on physical health. They will discover the importance of self-care and explore self-care practices which may work for them. Patients will also learn how to utilize the 4 C's to cultivate a healthier outlook and better manage stress and challenges. The purpose of this lesson is to demonstrate to patients how a healthy outlook is an essential part of  maintaining good health, especially as they continue their cardiac rehab journey.  Healthy Sleep for a Healthy Heart Clinical staff led group instruction and group discussion with PowerPoint presentation and patient guidebook. To enhance the learning environment the use of posters, models and videos may be added. At the conclusion of this workshop, patients will be able to demonstrate knowledge of the importance of sleep to overall health, well-being, and quality of life. They will understand the symptoms of, and treatments for, common sleep disorders. Patients will also be able to identify daytime and nighttime behaviors which impact sleep, and they will be able to apply these tools to help manage sleep-related challenges. The purpose of this lesson is to provide patients with a general overview of sleep and outline the importance of quality sleep. Patients will learn about a few of the most common sleep disorders. Patients will also be introduced to the concept of "sleep hygiene," and discover ways to self-manage certain sleeping problems through simple daily behavior changes. Finally, the workshop will motivate patients by clarifying the links between quality sleep and their goals of heart-healthy living.   Recognizing and Reducing Stress Clinical staff led group instruction and group discussion with PowerPoint presentation and patient guidebook. To enhance the learning environment the use of posters, models and videos may be added. At the conclusion of this workshop, patients will be able to understand the types of stress reactions, differentiate between acute and chronic stress, and recognize the impact that chronic stress has on their health. They will also be able to apply different coping mechanisms, such as reframing negative self-talk. Patients will have the opportunity to practice a variety of stress management techniques, such as deep abdominal breathing, progressive muscle relaxation, and/or  guided imagery.  The purpose of this lesson is to educate patients on the role of stress in their lives and to provide healthy techniques for coping with it.  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 Tobi says she becomes lightheaded due to issues with allergies.   Learning Preferences Skilled Demonstration          Education Topics:  Knowledge Questionnaire Score:  Knowledge Questionnaire Score - 03/03/24 1402       Knowledge Questionnaire Score   Pre Score 19/24          Core Components/Risk Factors/Patient Goals at Admission:  Personal Goals and Risk Factors at Admission - 03/03/24 1144       Core  Components/Risk Factors/Patient Goals on Admission    Weight Management Yes;Obesity;Weight Loss    Intervention Weight Management/Obesity: Establish reasonable short term and long term weight goals.;Obesity: Provide education and appropriate resources to help participant work on and attain dietary goals.    Admit Weight 218 lb 14.7 oz (99.3 kg)    Expected Outcomes Short Term: Continue to assess and modify interventions until short term weight is achieved;Long Term: Adherence to nutrition and physical activity/exercise program aimed toward attainment of established weight goal;Weight Loss: Understanding of general recommendations for a balanced deficit meal plan, which promotes 1-2 lb weight loss per week and includes a negative energy balance of [phone] kcal/d    Hypertension Yes    Intervention Provide education on lifestyle modifcations including regular physical activity/exercise, weight management, moderate sodium restriction and increased consumption of fresh fruit, vegetables, and low fat dairy, alcohol moderation, and smoking cessation.;Monitor prescription use compliance.    Expected Outcomes Short  Term: Continued assessment and intervention until BP is < 140/66mm HG in hypertensive participants. < 130/85mm HG in hypertensive participants with diabetes, heart failure or chronic kidney disease.;Long Term: Maintenance of blood pressure at goal levels.    Lipids Yes    Intervention Provide education and support for participant on nutrition & aerobic/resistive exercise along with prescribed medications to achieve LDL 70mg , HDL >40mg .    Expected Outcomes Short Term: Participant states understanding of desired cholesterol values and is compliant with medications prescribed. Participant is following exercise prescription and nutrition guidelines.;Long Term: Cholesterol controlled with medications as prescribed, with individualized exercise RX and with personalized nutrition plan. Value goals: LDL < 70mg , HDL > 40 mg.          Core Components/Risk Factors/Patient Goals Review:   Goals and Risk Factor Review     Row Name 03/09/24 1743 03/31/24 1427           Core Components/Risk Factors/Patient Goals Review   Personal Goals Review Weight Management/Obesity;Hypertension;Lipids Weight Management/Obesity;Hypertension;Lipids      Review Izel started cardiac rehab on 03/09/24. Sharma did fair with exercise for her fitness level Vital signs were stable. Shauntelle is doing well with exercise  at cardiac rehab. Vital signs have been stable. Gilberte says that participating in cardiac rehab has been helpful for her. Penney says that she feels stronger.      Expected Outcomes Matrtha will continue to participat in cardiac rehab for exercise nutrition and lifestyle modificaitons Wyvonna will continue to participate in cardiac rehab for exercise nutrition and lifestyle modificaitons         Core Components/Risk Factors/Patient Goals at Discharge (Final Review):   Goals and Risk Factor Review - 03/31/24 1427       Core Components/Risk Factors/Patient Goals Review   Personal Goals Review Weight  Management/Obesity;Hypertension;Lipids    Review Dachelle is doing well with exercise  at cardiac rehab. Vital signs have been stable. Brigetta says that participating in cardiac rehab has been helpful for her. Haniah says that she feels stronger.    Expected Outcomes Wyvonna will continue to participate in cardiac rehab for exercise nutrition and lifestyle modificaitons          ITP Comments:  ITP Comments     Row Name 03/03/24 1059 03/09/24 1135 03/31/24 1423       ITP Comments Dr Wilbert Bihari MD, Medical Director. Introduction to Pritikin Education Program/ Intensive Cardiac Rehab. Initial Orientation packet Reviewed with the patient. 30-day ITP review. Inis completed first session of exercise well without symptoms. Oriented her  to the exercise equipment and stretching routine. 30-day ITP review. Nasiya has good attendance and participation with exercise at cardiac rehab        Comments: See ITP Comments

## 2024-04-07 ENCOUNTER — Ambulatory Visit: Admitting: Podiatry

## 2024-04-07 ENCOUNTER — Encounter: Payer: Self-pay | Admitting: Podiatry

## 2024-04-07 VITALS — BP 138/82 | HR 63 | Temp 97.4°F | Resp 18 | Ht 60.75 in | Wt 218.0 lb

## 2024-04-07 DIAGNOSIS — B351 Tinea unguium: Secondary | ICD-10-CM | POA: Diagnosis not present

## 2024-04-07 DIAGNOSIS — M79674 Pain in right toe(s): Secondary | ICD-10-CM

## 2024-04-07 DIAGNOSIS — M79675 Pain in left toe(s): Secondary | ICD-10-CM

## 2024-04-07 NOTE — Progress Notes (Signed)
 Subjective: Chief Complaint  Patient presents with   Nail Problem    Patient is here for RFC    78 year old female presents the office today with concerns of thick, elongated nails that she is not able to trim herself.  She does not report any ulcerations.  No recent changes or other concerns today.  Objective: AAO x3, NAD DP/PT pulses palpable bilaterally, CRT less than 3 seconds-normal color to the digits today. Chronic appearing lower extremity edema present bilaterally.  There is no erythema or warmth. Nails are hypertrophic, dystrophic, brittle, discolored, elongated 10. No surrounding redness or drainage. Tenderness nails 1-5 bilaterally. No open lesions or pre-ulcerative lesions are identified today. No pain with calf compression, swelling, warmth, erythema  Assessment: 78 year old female with symptomatic onychomycosis, neuropathy  Plan:  Symptomatic onychomycosis-Plavix  -Sharply debrided the nails x 10 without any complications or bleeding  Neuropathy/circulatory concerns -ABI 03/13/2024: WNL -Discussed topical medications. -Daily foot inspection.  Return in about 3 months (around 07/08/2024) for nail trim .  Anne Barnes Fees DPM

## 2024-04-08 ENCOUNTER — Encounter (HOSPITAL_COMMUNITY): Admission: RE | Admit: 2024-04-08 | Source: Ambulatory Visit

## 2024-04-09 DIAGNOSIS — N289 Disorder of kidney and ureter, unspecified: Secondary | ICD-10-CM | POA: Insufficient documentation

## 2024-04-09 NOTE — Assessment & Plan Note (Addendum)
 Well controlled on amlodipine  2.5 mg daily, spironolactone  25 mg daily, valsartan  40 mg daily. No changes to meds. Encouraged heart healthy diet such as the DASH diet and exercise as tolerated.

## 2024-04-09 NOTE — Progress Notes (Signed)
 Subjective:     Patient ID: Anne Barnes, female    DOB: September 10, 1946, 78 y.o.   MRN: [id number]  Chief Complaint  Patient presents with   Medical Management of Chronic Issues    Patient presents today for a 2 month follow-up.   Quality Metric Gaps    AWV, mammogram    HPI Anne Barnes is a 78 year old female presents for follow up of chronic conditions. Lives with her adult daughter and 2 grandsons.  She is no longer driving. She is in cardiac rehab 3 days per week, reports that she feels much better overall.  First time in a long time she has been able to tie her shoes herself, has been enjoying cardiac rehab greatly.  Reports walking 4,000 steps per day.  She c/o experiences episodes of dizziness and lightheadedness, she reports this is often associated with sinus pressure and coughing. Pt states episodes occurs when allergies are worse.  Reports that her allergy  levels have been lowering, is followed by [facility]  and they are planning to stop allergy  shots soon.  Taking her Singulair  and Xyzal  as prescribed and overall feels her allergies are well-controlled.  Patient reports a long-standing history of urinary frequency, voiding at least hourly during the day and finding it disruptive to daily activities. Denies hematuria, dysuria, or hesitancy.  Also notes intermittent mild-moderate upper abdominal pain that occurs after eating.  Describes it as feeling tight, symptoms have improved but not fully resolved on pantoprazole  (Protonix ) 40 mg daily. Denies persistent abdominal pain, N/V, blood in stool, diarrhea.  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  Without residual angina.  She is in cardiac rehabilitation.  - Per cardiology:  -Continue DAPT for 1 year post ACS with Clopidogrel  75 mg once daily (til 01/2025), ASA 81 mg once daily.   - Continue Rosuvastatin  20  mg once daily  -No beta blocker with baseline bradycardia Cardiac Rehab- she is in therapy 3 days per week   HFpEF- echocardiogram in February of 2024 with preserved LVEF. Lasix  20mg  3 times weekly Spironolactone  33m daily Jardiance  10mg  daily   Hypertension Amlodipine  2.5mg  daily Valsartan  40mg  daily Spironolactone  25mg  daily Taking home BP- reports [phone]/ 70s BP Readings from Last 3 Encounters:  04/14/24 130/72  04/07/24 138/82  03/18/24 138/84     Hyperlipidemia goal 70mg /dL. Crestor  to 20mg . Reports no adverse SE. Compliant with medication.  Allergy - followed by [facility]ergy  and Asthma Levocetirizine (Xyzal ) 1 tablet 5 mg daily, Singulair  10 mg at bedtime,  GERD Last office visit patient was having increased acid reflux symptoms since returning home from the hospital. +dry cough.  She was started on Protonix  40 mg daily.   Reports symptoms are improved.  Reports compliance with medications    Health Maintenance Due  Topic Date Due   Medicare Annual Wellness (AWV)  06/05/2023    Past Medical History:  Diagnosis Date   Acute combined systolic and diastolic heart failure (HCC) 05/26/2015   ALLERGIC RHINITIS    Allergic urticaria 08/01/2014   Patient reports allergies to shrimp, egg whites, tomatoes, dairy    Allergy     seasonal and numerous food and drug allergies.   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   Office Spirometry 11/07/17-WNL-FVC 1.58/82%, FEV1 1.27/85%, ratio 0.80, FEF 25-75% 0.26/89%   Bradycardia 02/25/2017   CAD (coronary artery disease) 05/01/2023   Myoview  03/26/2017: No ischemia Non-obs dz -  CCTA 10/31/2019: CAC score 127; RCA proximal and mid 25-49, LAD proximal and mid 0-25    Chiari malformation    Noted MRI brain 09/2013 - s/p neuro eval for same   DIVERTICULOSIS, COLON    Essential hypertension 08/28/2010   Qualifier: Diagnosis of  By: Wilhemina RMA, Lucy      Frequent headaches    GERD    H/O measles    H/O mumps    Hearing loss 01/18/2017    History of chicken pox    Hyperglycemia 03/05/2016   Hyperlipidemia    HYPERTENSION    Hypocalcemia 02/25/2017   IBS (irritable bowel syndrome) 05/26/2015   Nonspecific abnormal electrocardiogram (ECG) (EKG)    OBSTRUCTIVE SLEEP APNEA 12/2008 dx   noncompliant with CPAP qhs   Obstructive sleep apnea 08/28/2010   NPSG 12/2008:  AHI 13/hr with desats to 78%    Preventative health care 06/11/2017   Sciatica    right side   Seasonal and perennial allergic rhinitis 08/28/2010   Allergy  profile 03/11/14- positive especially for dust, cat and dog Food allergy  profile- total IgE 86 with several food group elevations Sed rate-WNL      Sinusitis 06/06/2017   Sleep apnea    uses cpap   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   Vaginitis 01/18/2017   Vitamin D  deficiency 06/11/2017    Past Surgical History:  Procedure Laterality Date   ABDOMINAL HYSTERECTOMY  1986   BIOPSY  07/19/2022   Procedure: BIOPSY;  Surgeon: San Sandor GAILS, DO;  Location: [facility];  Service: Gastroenterology;;   BREAST SURGERY  1973   Breast biopsy   CARDIAC CATHETERIZATION     COLONOSCOPY  2011   Dr Kristie DOWNS ACUTE MI REVASCULARIZATION N/A 01/30/2024   Procedure: Coronary/Graft Acute MI Revascularization;  Surgeon: Swaziland, Peter M, MD;  Location: [facility] INVASIVE CV LAB;  Service: Cardiovascular;  Laterality: N/A;   ESOPHAGOGASTRODUODENOSCOPY (EGD) WITH PROPOFOL  N/A 07/19/2022   Procedure: ESOPHAGOGASTRODUODENOSCOPY (EGD) WITH PROPOFOL ;  Surgeon: San Sandor GAILS, DO;  Location: [facility];  Service: Gastroenterology;  Laterality: N/A;   LEFT HEART CATH AND CORONARY ANGIOGRAPHY N/A 01/30/2024   Procedure: LEFT HEART CATH AND CORONARY ANGIOGRAPHY;  Surgeon: Swaziland, Peter M, MD;  Location: [facility] INVASIVE CV LAB;  Service: Cardiovascular;  Laterality: N/A;   UMBILICAL HERNIA REPAIR      Family History  Problem Relation Age of Onset   Arthritis Mother        died of complications from hip surgery    Arthritis Father    Kidney disease Father    Pneumonia Father    Heart disease Father    Kidney disease Sister        dialysis   Obesity Sister    Hypertension Sister    Kidney disease Sister        dialysis   Diabetes Brother    Heart disease Brother    Heart attack Brother    Alcohol abuse Brother    Throat cancer Brother    Lung cancer Brother    Pneumonia Brother    Lupus Daughter    Rheum arthritis Daughter    Sjogren's syndrome Daughter    Fibromyalgia Daughter    Hypertension Daughter    ALS Daughter    Yvone' disease Daughter    Hypertension Daughter    Anxiety disorder Maternal Uncle    Hypertension Maternal Grandfather    Alcohol abuse Other        parent   Ataxia  Neg Hx    Chorea Neg Hx    Dementia Neg Hx    Mental retardation Neg Hx    Migraines Neg Hx    Multiple sclerosis Neg Hx    Neurofibromatosis Neg Hx    Neuropathy Neg Hx    Parkinsonism Neg Hx    Seizures Neg Hx    Stroke Neg Hx    Colon cancer Neg Hx    Colon polyps Neg Hx    Esophageal cancer Neg Hx    Rectal cancer Neg Hx    Stomach cancer Neg Hx     Social History   Socioeconomic History   Marital status: Widowed    Spouse name: Not on file   Number of children: 5   Years of education: 43   Highest education level: Some college, no degree  Occupational History   Occupation: retired  Tobacco Use   Smoking status: Never   Smokeless tobacco: Never   Tobacco comments:    dtr 2 g-kids.  Vaping Use   Vaping status: Never Used  Substance and Sexual Activity   Alcohol use: Not Currently    Comment: rare   Drug use: No   Sexual activity: Not Currently  Other Topics Concern   Not on file  Social History Narrative   Right handed   One story with daughter and two grandkids   Social Drivers of Corporate investment banker Strain: Low Risk  (06/04/2022)   Overall Financial Resource Strain (CARDIA)    Difficulty of Paying Living Expenses: Not hard at all  Food Insecurity: No Food  Insecurity (02/05/2024)   Hunger Vital Sign    Worried About Running Out of Food in the Last Year: Never true    Ran Out of Food in the Last Year: Never true  Transportation Needs: No Transportation Needs (02/05/2024)   PRAPARE - Administrator, Civil Service (Medical): No    Lack of Transportation (Non-Medical): No  Physical Activity: Insufficiently Active (06/04/2022)   Exercise Vital Sign    Days of Exercise per Week: 4 days    Minutes of Exercise per Session: 30 min  Stress: No Stress Concern Present (06/04/2022)   [employer] of Occupational Health - Occupational Stress Questionnaire    Feeling of Stress : Not at all  Social Connections: Moderately Integrated (01/30/2024)   Social Connection and Isolation Panel    Frequency of Communication with Friends and Family: Three times a week    Frequency of Social Gatherings with Friends and Family: Once a week    Attends Religious Services: More than 4 times per year    Active Member of [employer] or Organizations: Yes    Attends Banker Meetings: More than 4 times per year    Marital Status: Widowed  Intimate Partner Violence: Not At Risk (02/05/2024)   Humiliation, Afraid, Rape, and Kick questionnaire    Fear of Current or Ex-Partner: No    Emotionally Abused: No    Physically Abused: No    Sexually Abused: No    Outpatient Medications Prior to Visit  Medication Sig Dispense Refill   albuterol  (VENTOLIN  HFA) 108 (90 Base) MCG/ACT inhaler INHALE 2 PUFFS BY MOUTH 2 puffs every 4-6 hours as needed for cough/wheeze/shortness of breath/chest 18 g 1   alendronate  (FOSAMAX ) 70 MG tablet Take 1 tablet (70 mg total) by mouth once a week. Take with full glass of water on an empty stomach 12 tablet 3   ALPRAZolam  (XANAX )  0.5 MG tablet TAKE 1 TABLET BY MOUTH TWICE DAILY AS NEEDED FOR ANXIETY AND FOR SLEEP (Patient taking differently: Take 0.25 mg by mouth 2 (two) times daily as needed for anxiety or sleep.) 20 tablet 0    amLODipine  (NORVASC ) 2.5 MG tablet Take 1 tablet (2.5 mg total) by mouth daily. 30 tablet 3   aspirin  EC 81 MG tablet Take 1 tablet (81 mg total) by mouth daily. Swallow whole. 30 tablet 3   azelastine  (ASTELIN ) 0.1 % nasal spray Place 2 sprays into both nostrils 2 (two) times daily as needed for rhinitis. 30 mL 5   azelastine  (OPTIVAR ) 0.05 % ophthalmic solution Place 1 drop into both eyes 2 (two) times daily as needed. 6 mL 5   b complex vitamins tablet Take 1 tablet by mouth daily.     Cholecalciferol (VITAMIN D ) 50 MCG (2000 UT) tablet Take 2,000 Units by mouth daily.     clopidogrel  (PLAVIX ) 75 MG tablet Take 1 tablet (75 mg total) by mouth daily with breakfast. 90 tablet 3   empagliflozin  (JARDIANCE ) 10 MG TABS tablet Take 1 tablet (10 mg total) by mouth daily before breakfast. 90 tablet 3   EPINEPHrine  0.3 mg/0.3 mL IJ SOAJ injection Inject 0.3 mg into the muscle as needed for anaphylaxis. 2 each 2   fluticasone  (FLONASE ) 50 MCG/ACT nasal spray Use 1-2 sprays in each nostril once  a day as needed for stuffy nose. In the right nostril, point the applicator out toward the right ear. In the left nostril, point the applicator out toward the left ear 16 g 5   Fluticasone  Propionate (XHANCE  NA) Place 2 sprays into the nose 2 (two) times daily as needed.     furosemide  (LASIX ) 20 MG tablet Take 1 tablet (20 mg total) by mouth 3 (three) times a week. Monday, Wednesday and Friday. 90 tablet 3   levocetirizine (XYZAL ) 5 MG tablet Take 1 tablet (5 mg total) by mouth every evening. 30 tablet 5   MAGNESIUM  CITRATE PO Take 1 tablet by mouth daily.     montelukast  (SINGULAIR ) 10 MG tablet Take 1 tablet (10 mg total) by mouth at bedtime. 30 tablet 5   Multiple Vitamin (MULTIVITAMIN) tablet Take 1 tablet by mouth daily.     nitroGLYCERIN  (NITROSTAT ) 0.4 MG SL tablet Place 1 tablet (0.4 mg total) under the tongue every 5 (five) minutes as needed for chest pain. 25 tablet 1   Pseudoephedrine-Ibuprofen (ADVIL  COLD/SINUS PO) Take 2 tablets by mouth as needed.     rosuvastatin  (CRESTOR ) 20 MG tablet Take 1 tablet (20 mg total) by mouth daily. 90 tablet 3   spironolactone  (ALDACTONE ) 25 MG tablet Take 1 tablet (25 mg total) by mouth daily. 90 tablet 2   UNABLE TO FIND Take 2 capsules by mouth daily. Fish Oil w/ tumeric     valsartan  (DIOVAN ) 40 MG tablet Take 1 tablet (40 mg total) by mouth daily. 90 tablet 3   pantoprazole  (PROTONIX ) 40 MG tablet Take 1 tablet (40 mg total) by mouth daily. 90 tablet 3   No facility-administered medications prior to visit.    Allergies  Allergen Reactions   Codeine Nausea And Vomiting   Hydralazine  Hcl Other (See Comments)    Pt reports causes dizziness   Lisinopril-Hydrochlorothiazide Hives    REACTION: lip swelling,facial swelling,rash.   Metoprolol  Other (See Comments)    Does not feel well on it    Other Cough    Patient states she is allergic  to Shrimp, egg whites, tomatoes and dairy products.  Says they make her feel unwell but do not cause hives, SOB or anaphylaxis.  She just avoids eating them because they can make her have a headache.  Strawberries, watermelon, peanuts, and all shellfish.    environmental   Shrimp [Shellfish Allergy ] Other (See Comments)    Was allergy  tested and showed shrimp was one--has not reacted b/c avoided    ROS     Objective:     Physical Exam Vitals reviewed.  Constitutional:      General: She is not in acute distress.    Appearance: Normal appearance. She is obese. She is not ill-appearing, toxic-appearing or diaphoretic.  HENT:     Head: Normocephalic and atraumatic.     Nose: Nose normal.     Mouth/Throat:     Mouth: Mucous membranes are moist.  Cardiovascular:     Rate and Rhythm: Normal rate and regular rhythm.     Pulses: Normal pulses.     Heart sounds: Normal heart sounds. No murmur heard.    No friction rub. No gallop.  Pulmonary:     Effort: Pulmonary effort is normal. No respiratory distress.      Breath sounds: Normal breath sounds.     Comments: Clear  Abdominal:     Palpations: Abdomen is soft.     Tenderness: There is no abdominal tenderness.  Musculoskeletal:        General: Normal range of motion.     Cervical back: Normal range of motion.     Right lower leg: No edema.     Left lower leg: No edema.  Skin:    General: Skin is warm and dry.     Capillary Refill: Capillary refill takes less than 2 seconds.  Neurological:     General: No focal deficit present.     Mental Status: She is alert and oriented to person, place, and time.  Psychiatric:        Mood and Affect: Mood normal.        Behavior: Behavior normal.        Thought Content: Thought content normal.        Judgment: Judgment normal.     BP 130/72   Pulse 81   Resp 16   Ht 5' 0.75 (1.543 m)   Wt 220 lb 3.2 oz (99.9 kg)   SpO2 98%   BMI 41.95 kg/m  Wt Readings from Last 3 Encounters:  04/14/24 220 lb 3.2 oz (99.9 kg)  04/07/24 218 lb (98.9 kg)  03/03/24 218 lb 14.7 oz (99.3 kg)       Assessment & Plan:   Problem List Items Addressed This Visit     Epigastric pain   Check CBC, H. pylori, lipase today.   Longstanding history of IBS-C with associated abdominal bloating. Consider GI referral if symptoms persist or worsen.      Relevant Orders   H. pylori breath test   Essential hypertension   Well controlled on amlodipine  2.5 mg daily, spironolactone  25 mg daily, valsartan  40 mg daily. No changes to meds. Encouraged heart healthy diet such as the DASH diet and exercise as tolerated.        Relevant Orders   Lipase   GERD   Symptoms improved on pantoprazole  40 mg daily, but still having occasional epigastric discomfort.  Longstanding history of IBS-C with associated abdominal bloating.   Counseled on lifestyle modifications including avoiding trigger foods (spicy, fatty, acidic), eating  smaller meals, not lying down within 2-3 hours of eating, elevating head of bed, and weight management.   Return to clinicI if symptoms persist or worsen.      Relevant Medications   pantoprazole  (PROTONIX ) 40 MG tablet   Hyperlipidemia   Stable on rosuvastatin  20 mg daily.  Followed by cardiology. Encourage heart healthy diet such as MIND or DASH diet, increase exercise, avoid trans fats, simple carbohydrates and processed foods, consider a krill or fish or flaxseed oil cap daily.          Morbid obesity (HCC)   Encouraged DASH or MIND diet, decrease po intake and increase exercise as tolerated.  Avoid trans fats, eat small, frequent meals every 4-5 hours with lean proteins, complex carbs and healthy fats. Minimize simple carbs, high fat foods and processed foods.      Relevant Orders   TSH   Osteoporosis   Stable on Fosamax . Encouraged to get adequate exercise, calcium  and vitamin d  intake. Last vitamin D  Lab Results  Component Value Date   VD25OH 50.94 01/23/2024         Preventative health care - Primary   Patient encouraged to maintain heart healthy diet, regular exercise, adequate sleep. Consider daily probiotics. Take medications as prescribed. Labs reviewed with patient, immunizations reviewed.  HCM -MGM- Last 2017, Due.  Patient denies at this time, prefers to no longer have mammograms. -Colonoscopy in 2022 they will consider repeat scope in 2029 depending on patient health, due to advanced age may not repeat. Patient has aged out of and declines MGM, paps.  -Dexa was 2022, repeat 1-2 years declines today.        Renal insufficiency   Hydrate and monitor.  Recheck CMP.         Relevant Orders   Comp Met (CMET)   Urine frequency   UA negative, urine culture pending.  Referred to urology for further evaluation.      Relevant Orders   Urine Culture   Ambulatory referral to Urology   POCT urinalysis dipstick (Completed)   Other Visit Diagnoses       Abnormal CBC       Relevant Orders   CBC with Differential/Platelet   TSH      Lightheadedness Intermittent  dizziness and lightheadedness, possibly related to hydration. Recheck BMP. - Encourage consistent hydration and protein intake. - Monitor symptoms and report any escalation.  Portions of this note were dictated using DRAGON voice recognition software. Please disregard any errors in transcription.    I am having Anne Barnes maintain her multivitamin, b complex vitamins, furosemide , Vitamin D , nitroGLYCERIN , azelastine , fluticasone , amLODipine , spironolactone , ALPRAZolam , EPINEPHrine , Fluticasone  Propionate (XHANCE  NA), Pseudoephedrine-Ibuprofen (ADVIL COLD/SINUS PO), MAGNESIUM  CITRATE PO, UNABLE TO FIND, aspirin  EC, empagliflozin , clopidogrel , rosuvastatin , valsartan , levocetirizine, montelukast , azelastine , albuterol , alendronate , and pantoprazole .  Meds ordered this encounter  Medications   DISCONTD: famotidine  (PEPCID ) 20 MG tablet    Sig: Take 1 tablet (20 mg total) by mouth daily.    Dispense:  90 tablet    Refill:  1    Supervising Provider:   DOMENICA BLACKBIRD A [[id number]]   pantoprazole  (PROTONIX ) 40 MG tablet    Sig: Take 1 tablet (40 mg total) by mouth daily.    Dispense:  90 tablet    Refill:  1    Supervising Provider:   DOMENICA BLACKBIRD A [[id number]]

## 2024-04-09 NOTE — Assessment & Plan Note (Addendum)
 Symptoms improved on pantoprazole  40 mg daily, but still having occasional epigastric discomfort.  Longstanding history of IBS-C with associated abdominal bloating.   Counseled on lifestyle modifications including avoiding trigger foods (spicy, fatty, acidic), eating smaller meals, not lying down within 2-3 hours of eating, elevating head of bed, and weight management.  Return to clinicI if symptoms persist or worsen.

## 2024-04-09 NOTE — Assessment & Plan Note (Addendum)
 Patient encouraged to maintain heart healthy diet, regular exercise, adequate sleep. Consider daily probiotics. Take medications as prescribed. Labs reviewed with patient, immunizations reviewed.  HCM -MGM- Last 2017, Due.  Patient denies at this time, prefers to no longer have mammograms. -Colonoscopy in 2022 they will consider repeat scope in 2029 depending on patient health, due to advanced age may not repeat. Patient has aged out of and declines MGM, paps.  -Dexa was 2022, repeat 1-2 years declines today.

## 2024-04-09 NOTE — Assessment & Plan Note (Signed)
 Encouraged DASH or MIND diet, decrease po intake and increase exercise as tolerated.  Avoid trans fats, eat small, frequent meals every 4-5 hours with lean proteins, complex carbs and healthy fats. Minimize simple carbs, high fat foods and processed foods.

## 2024-04-09 NOTE — Assessment & Plan Note (Addendum)
 Hydrate and monitor.  Recheck CMP.

## 2024-04-09 NOTE — Assessment & Plan Note (Addendum)
 Stable on rosuvastatin  20 mg daily.  Followed by cardiology. Encourage heart healthy diet such as MIND or DASH diet, increase exercise, avoid trans fats, simple carbohydrates and processed foods, consider a krill or fish or flaxseed oil cap daily.

## 2024-04-10 ENCOUNTER — Ambulatory Visit: Payer: Self-pay | Admitting: Allergy

## 2024-04-10 ENCOUNTER — Encounter (HOSPITAL_COMMUNITY)
Admission: RE | Admit: 2024-04-10 | Discharge: 2024-04-10 | Disposition: A | Discharge status: Home / Self Care | Source: Ambulatory Visit | Attending: Internal Medicine | Admitting: Internal Medicine

## 2024-04-10 DIAGNOSIS — I252 Old myocardial infarction: Secondary | ICD-10-CM | POA: Diagnosis not present

## 2024-04-10 DIAGNOSIS — Z955 Presence of coronary angioplasty implant and graft: Secondary | ICD-10-CM | POA: Diagnosis not present

## 2024-04-10 DIAGNOSIS — Z48812 Encounter for surgical aftercare following surgery on the circulatory system: Secondary | ICD-10-CM | POA: Diagnosis not present

## 2024-04-10 DIAGNOSIS — I2111 ST elevation (STEMI) myocardial infarction involving right coronary artery: Secondary | ICD-10-CM

## 2024-04-13 ENCOUNTER — Encounter (HOSPITAL_COMMUNITY)
Admission: RE | Admit: 2024-04-13 | Discharge: 2024-04-13 | Disposition: A | Discharge status: Home / Self Care | Source: Ambulatory Visit | Attending: Internal Medicine

## 2024-04-13 DIAGNOSIS — I2111 ST elevation (STEMI) myocardial infarction involving right coronary artery: Secondary | ICD-10-CM

## 2024-04-13 DIAGNOSIS — Z955 Presence of coronary angioplasty implant and graft: Secondary | ICD-10-CM

## 2024-04-13 DIAGNOSIS — Z48812 Encounter for surgical aftercare following surgery on the circulatory system: Secondary | ICD-10-CM | POA: Diagnosis not present

## 2024-04-13 DIAGNOSIS — I252 Old myocardial infarction: Secondary | ICD-10-CM | POA: Diagnosis not present

## 2024-04-14 ENCOUNTER — Ambulatory Visit (INDEPENDENT_AMBULATORY_CARE_PROVIDER_SITE_OTHER): Admitting: Student

## 2024-04-14 ENCOUNTER — Encounter: Payer: Self-pay | Admitting: Student

## 2024-04-14 VITALS — BP 130/72 | HR 81 | Resp 16 | Ht 60.75 in | Wt 220.2 lb

## 2024-04-14 DIAGNOSIS — R7989 Other specified abnormal findings of blood chemistry: Secondary | ICD-10-CM

## 2024-04-14 DIAGNOSIS — N289 Disorder of kidney and ureter, unspecified: Secondary | ICD-10-CM

## 2024-04-14 DIAGNOSIS — I10 Essential (primary) hypertension: Secondary | ICD-10-CM

## 2024-04-14 DIAGNOSIS — E7800 Pure hypercholesterolemia, unspecified: Secondary | ICD-10-CM

## 2024-04-14 DIAGNOSIS — K219 Gastro-esophageal reflux disease without esophagitis: Secondary | ICD-10-CM

## 2024-04-14 DIAGNOSIS — Z0000 Encounter for general adult medical examination without abnormal findings: Secondary | ICD-10-CM | POA: Diagnosis not present

## 2024-04-14 DIAGNOSIS — R350 Frequency of micturition: Secondary | ICD-10-CM | POA: Insufficient documentation

## 2024-04-14 DIAGNOSIS — R1013 Epigastric pain: Secondary | ICD-10-CM

## 2024-04-14 DIAGNOSIS — M810 Age-related osteoporosis without current pathological fracture: Secondary | ICD-10-CM

## 2024-04-14 DIAGNOSIS — Z1231 Encounter for screening mammogram for malignant neoplasm of breast: Secondary | ICD-10-CM

## 2024-04-14 LAB — POCT URINALYSIS DIPSTICK
Bilirubin, UA: NEGATIVE
Blood, UA: NEGATIVE
Glucose, UA: POSITIVE — AB
Ketones, UA: NEGATIVE
Leukocytes, UA: NEGATIVE
Nitrite, UA: NEGATIVE
Protein, UA: NEGATIVE
Spec Grav, UA: <=1.005 — AB (ref 1.010–1.025)
Urobilinogen, UA: 0.2 U/dL
pH, UA: 5 (ref 5.0–8.0)

## 2024-04-14 MED ORDER — PANTOPRAZOLE SODIUM 40 MG PO TBEC: 40.0000 mg | DELAYED_RELEASE_TABLET | Freq: Every day | ORAL | 1 refills | Status: DC

## 2024-04-14 MED ORDER — FAMOTIDINE 20 MG PO TABS: 20.0000 mg | ORAL_TABLET | Freq: Every day | ORAL | 1 refills | Status: DC

## 2024-04-14 NOTE — Assessment & Plan Note (Addendum)
 Stable on Fosamax . Encouraged to get adequate exercise, calcium  and vitamin d  intake. Last vitamin D  Lab Results  Component Value Date   VD25OH 50.94 01/23/2024

## 2024-04-14 NOTE — Assessment & Plan Note (Addendum)
 Check CBC, H. pylori, lipase today.   Longstanding history of IBS-C with associated abdominal bloating. Consider GI referral if symptoms persist or worsen.

## 2024-04-14 NOTE — Assessment & Plan Note (Signed)
 UA negative, urine culture pending.  Referred to urology for further evaluation.

## 2024-04-15 ENCOUNTER — Encounter (HOSPITAL_COMMUNITY)
Admission: RE | Admit: 2024-04-15 | Discharge: 2024-04-15 | Disposition: A | Discharge status: Home / Self Care | Source: Ambulatory Visit | Attending: Internal Medicine | Admitting: Internal Medicine

## 2024-04-15 ENCOUNTER — Ambulatory Visit: Payer: Self-pay | Admitting: Student

## 2024-04-15 DIAGNOSIS — Z955 Presence of coronary angioplasty implant and graft: Secondary | ICD-10-CM | POA: Diagnosis not present

## 2024-04-15 DIAGNOSIS — I2111 ST elevation (STEMI) myocardial infarction involving right coronary artery: Secondary | ICD-10-CM

## 2024-04-15 DIAGNOSIS — Z48812 Encounter for surgical aftercare following surgery on the circulatory system: Secondary | ICD-10-CM | POA: Diagnosis not present

## 2024-04-15 DIAGNOSIS — I252 Old myocardial infarction: Secondary | ICD-10-CM | POA: Diagnosis not present

## 2024-04-15 LAB — COMPREHENSIVE METABOLIC PANEL WITH GFR
ALT: 26 U/L (ref 0–35)
AST: 21 U/L (ref 0–37)
Albumin: 4.2 g/dL (ref 3.5–5.2)
Alkaline Phosphatase: 78 U/L (ref 39–117)
BUN: 23 mg/dL (ref 6–23)
CO2: 27 meq/L (ref 19–32)
Calcium: 9 mg/dL (ref 8.4–10.5)
Chloride: 105 meq/L (ref 96–112)
Creatinine, Ser: 0.98 mg/dL (ref 0.40–1.20)
GFR: 56.03 mL/min — ABNORMAL LOW (ref >60.00)
Glucose, Bld: 85 mg/dL (ref 70–99)
Potassium: 4.6 meq/L (ref 3.5–5.1)
Sodium: 142 meq/L (ref 135–145)
Total Bilirubin: 0.4 mg/dL (ref 0.2–1.2)
Total Protein: 7.2 g/dL (ref 6.0–8.3)

## 2024-04-15 LAB — CBC WITH DIFFERENTIAL/PLATELET
Basophils Absolute: 0.1 K/uL (ref 0.0–0.1)
Basophils Relative: 0.9 % (ref 0.0–3.0)
Eosinophils Absolute: 0.3 K/uL (ref 0.0–0.7)
Eosinophils Relative: 3.3 % (ref 0.0–5.0)
HCT: 41 % (ref 36.0–46.0)
Hemoglobin: 13.3 g/dL (ref 12.0–15.0)
Lymphocytes Relative: 16.1 % (ref 12.0–46.0)
Lymphs Abs: 1.5 K/uL (ref 0.7–4.0)
MCHC: 32.3 g/dL (ref 30.0–36.0)
MCV: 91 fl (ref 78.0–100.0)
Monocytes Absolute: 0.8 K/uL (ref 0.1–1.0)
Monocytes Relative: 8.7 % (ref 3.0–12.0)
Neutro Abs: 6.6 K/uL (ref 1.4–7.7)
Neutrophils Relative %: 71 % (ref 43.0–77.0)
Platelets: 243 K/uL (ref 150.0–400.0)
RBC: 4.51 Mil/uL (ref 3.87–5.11)
RDW: 15.1 % (ref 11.5–15.5)
WBC: 9.4 K/uL (ref 4.0–10.5)

## 2024-04-15 LAB — URINE CULTURE
MICRO NUMBER:: 16729924
Result:: NO GROWTH
SPECIMEN QUALITY:: ADEQUATE

## 2024-04-15 LAB — LIPASE: Lipase: 15 U/L (ref 11.0–59.0)

## 2024-04-15 LAB — H. PYLORI BREATH TEST: H. pylori Breath Test: NOT DETECTED

## 2024-04-15 LAB — TSH: TSH: 0.89 u[IU]/mL (ref 0.35–5.50)

## 2024-04-17 ENCOUNTER — Encounter (HOSPITAL_COMMUNITY)

## 2024-04-20 ENCOUNTER — Encounter (HOSPITAL_COMMUNITY)

## 2024-04-22 ENCOUNTER — Encounter (HOSPITAL_COMMUNITY)

## 2024-04-24 ENCOUNTER — Encounter (HOSPITAL_COMMUNITY)

## 2024-04-27 ENCOUNTER — Encounter (HOSPITAL_COMMUNITY): Admission: RE | Admit: 2024-04-27 | Source: Ambulatory Visit

## 2024-04-29 ENCOUNTER — Encounter (HOSPITAL_COMMUNITY)
Admission: RE | Admit: 2024-04-29 | Discharge: 2024-04-29 | Disposition: A | Discharge status: Home / Self Care | Source: Ambulatory Visit | Attending: Internal Medicine | Admitting: Internal Medicine

## 2024-04-29 DIAGNOSIS — I252 Old myocardial infarction: Secondary | ICD-10-CM | POA: Diagnosis not present

## 2024-04-29 DIAGNOSIS — Z955 Presence of coronary angioplasty implant and graft: Secondary | ICD-10-CM | POA: Diagnosis not present

## 2024-04-29 DIAGNOSIS — Z48812 Encounter for surgical aftercare following surgery on the circulatory system: Secondary | ICD-10-CM | POA: Insufficient documentation

## 2024-04-29 DIAGNOSIS — I2111 ST elevation (STEMI) myocardial infarction involving right coronary artery: Secondary | ICD-10-CM | POA: Diagnosis present

## 2024-05-01 ENCOUNTER — Encounter (HOSPITAL_COMMUNITY)
Admission: RE | Admit: 2024-05-01 | Discharge: 2024-05-01 | Disposition: A | Discharge status: Home / Self Care | Source: Ambulatory Visit | Attending: Internal Medicine | Admitting: Internal Medicine

## 2024-05-01 DIAGNOSIS — Z955 Presence of coronary angioplasty implant and graft: Secondary | ICD-10-CM | POA: Diagnosis not present

## 2024-05-01 DIAGNOSIS — I252 Old myocardial infarction: Secondary | ICD-10-CM | POA: Diagnosis not present

## 2024-05-01 DIAGNOSIS — Z48812 Encounter for surgical aftercare following surgery on the circulatory system: Secondary | ICD-10-CM | POA: Diagnosis not present

## 2024-05-01 DIAGNOSIS — I2111 ST elevation (STEMI) myocardial infarction involving right coronary artery: Secondary | ICD-10-CM

## 2024-05-04 ENCOUNTER — Encounter (HOSPITAL_COMMUNITY)
Admission: RE | Admit: 2024-05-04 | Discharge: 2024-05-04 | Disposition: A | Discharge status: Home / Self Care | Source: Ambulatory Visit | Attending: Internal Medicine

## 2024-05-04 DIAGNOSIS — Z955 Presence of coronary angioplasty implant and graft: Secondary | ICD-10-CM

## 2024-05-04 DIAGNOSIS — Z48812 Encounter for surgical aftercare following surgery on the circulatory system: Secondary | ICD-10-CM | POA: Diagnosis not present

## 2024-05-04 DIAGNOSIS — I2111 ST elevation (STEMI) myocardial infarction involving right coronary artery: Secondary | ICD-10-CM

## 2024-05-04 DIAGNOSIS — I252 Old myocardial infarction: Secondary | ICD-10-CM | POA: Diagnosis not present

## 2024-05-05 NOTE — Progress Notes (Signed)
 Cardiac Individual Treatment Plan  Patient Details  Name: TAELAR GRONEWOLD MRN: [id number] Date of Birth: 08/30/1946 Referring Provider:   Flowsheet Row INTENSIVE CARDIAC REHAB ORIENT from 03/03/2024 in [facility]  Referring Provider Santo Stanly DELENA, MD    Initial Encounter Date:  Flowsheet Row INTENSIVE CARDIAC REHAB ORIENT from 03/03/2024 in [facility], [facility]  Date 03/03/24    Visit Diagnosis: 01/30/24 S/P PCI/DES RCA  01/30/24 STEMI  Patient's Home Medications on Admission:  Current Outpatient Medications:    albuterol  (VENTOLIN  HFA) 108 (90 Base) MCG/ACT inhaler, INHALE 2 PUFFS BY MOUTH 2 puffs every 4-6 hours as needed for cough/wheeze/shortness of breath/chest, Disp: 18 g, Rfl: 1   alendronate  (FOSAMAX ) 70 MG tablet, Take 1 tablet (70 mg total) by mouth once a week. Take with full glass of water on an empty stomach, Disp: 12 tablet, Rfl: 3   ALPRAZolam  (XANAX ) 0.5 MG tablet, TAKE 1 TABLET BY MOUTH TWICE DAILY AS NEEDED FOR ANXIETY AND FOR SLEEP (Patient taking differently: Take 0.25 mg by mouth 2 (two) times daily as needed for anxiety or sleep.), Disp: 20 tablet, Rfl: 0   amLODipine  (NORVASC ) 2.5 MG tablet, Take 1 tablet (2.5 mg total) by mouth daily., Disp: 30 tablet, Rfl: 3   aspirin  EC 81 MG tablet, Take 1 tablet (81 mg total) by mouth daily. Swallow whole., Disp: 30 tablet, Rfl: 3   azelastine  (ASTELIN ) 0.1 % nasal spray, Place 2 sprays into both nostrils 2 (two) times daily as needed for rhinitis., Disp: 30 mL, Rfl: 5   azelastine  (OPTIVAR ) 0.05 % ophthalmic solution, Place 1 drop into both eyes 2 (two) times daily as needed., Disp: 6 mL, Rfl: 5   b complex vitamins tablet, Take 1 tablet by mouth daily., Disp: , Rfl:    Cholecalciferol (VITAMIN D ) 50 MCG (2000 UT) tablet, Take 2,000 Units by mouth daily., Disp: , Rfl:    clopidogrel  (PLAVIX ) 75 MG tablet,  Take 1 tablet (75 mg total) by mouth daily with breakfast., Disp: 90 tablet, Rfl: 3   empagliflozin  (JARDIANCE ) 10 MG TABS tablet, Take 1 tablet (10 mg total) by mouth daily before breakfast., Disp: 90 tablet, Rfl: 3   EPINEPHrine  0.3 mg/0.3 mL IJ SOAJ injection, Inject 0.3 mg into the muscle as needed for anaphylaxis., Disp: 2 each, Rfl: 2   fluticasone  (FLONASE ) 50 MCG/ACT nasal spray, Use 1-2 sprays in each nostril once  a day as needed for stuffy nose. In the right nostril, point the applicator out toward the right ear. In the left nostril, point the applicator out toward the left ear, Disp: 16 g, Rfl: 5   Fluticasone  Propionate (XHANCE  NA), Place 2 sprays into the nose 2 (two) times daily as needed., Disp: , Rfl:    furosemide  (LASIX ) 20 MG tablet, Take 1 tablet (20 mg total) by mouth 3 (three) times a week. Monday, Wednesday and Friday., Disp: 90 tablet, Rfl: 3   levocetirizine (XYZAL ) 5 MG tablet, Take 1 tablet (5 mg total) by mouth every evening., Disp: 30 tablet, Rfl: 5   MAGNESIUM  CITRATE PO, Take 1 tablet by mouth daily., Disp: , Rfl:    montelukast  (SINGULAIR ) 10 MG tablet, Take 1 tablet (10 mg total) by mouth at bedtime., Disp: 30 tablet, Rfl: 5   Multiple Vitamin (MULTIVITAMIN) tablet, Take 1 tablet by mouth daily., Disp: , Rfl:    nitroGLYCERIN  (NITROSTAT ) 0.4 MG SL tablet,  Place 1 tablet (0.4 mg total) under the tongue every 5 (five) minutes as needed for chest pain., Disp: 25 tablet, Rfl: 1   pantoprazole  (PROTONIX ) 40 MG tablet, Take 1 tablet (40 mg total) by mouth daily., Disp: 90 tablet, Rfl: 1   Pseudoephedrine-Ibuprofen (ADVIL COLD/SINUS PO), Take 2 tablets by mouth as needed., Disp: , Rfl:    rosuvastatin  (CRESTOR ) 20 MG tablet, Take 1 tablet (20 mg total) by mouth daily., Disp: 90 tablet, Rfl: 3   spironolactone  (ALDACTONE ) 25 MG tablet, Take 1 tablet (25 mg total) by mouth daily., Disp: 90 tablet, Rfl: 2   UNABLE TO FIND, Take 2 capsules by mouth daily. Fish Oil w/ tumeric,  Disp: , Rfl:    valsartan  (DIOVAN ) 40 MG tablet, Take 1 tablet (40 mg total) by mouth daily., Disp: 90 tablet, Rfl: 3  Past Medical History: Past Medical History:  Diagnosis Date   Acute combined systolic and diastolic heart failure (HCC) 05/26/2015   ALLERGIC RHINITIS    Allergic urticaria 08/01/2014   Patient reports allergies to shrimp, egg whites, tomatoes, dairy    Allergy     seasonal and numerous food and drug allergies.   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   Office Spirometry 11/07/17-WNL-FVC 1.58/82%, FEV1 1.27/85%, ratio 0.80, FEF 25-75% 0.26/89%   Bradycardia 02/25/2017   CAD (coronary artery disease) 05/01/2023   Myoview  03/26/2017: No ischemia Non-obs dz - CCTA 10/31/2019: CAC score 127; RCA proximal and mid 25-49, LAD proximal and mid 0-25    Chiari malformation    Noted MRI brain 09/2013 - s/p neuro eval for same   DIVERTICULOSIS, COLON    Essential hypertension 08/28/2010   Qualifier: Diagnosis of  By: Wilhemina RMA, Lucy      Frequent headaches    GERD    H/O measles    H/O mumps    Hearing loss 01/18/2017   History of chicken pox    Hyperglycemia 03/05/2016   Hyperlipidemia    HYPERTENSION    Hypocalcemia 02/25/2017   IBS (irritable bowel syndrome) 05/26/2015   Nonspecific abnormal electrocardiogram (ECG) (EKG)    OBSTRUCTIVE SLEEP APNEA 12/2008 dx   noncompliant with CPAP qhs   Obstructive sleep apnea 08/28/2010   NPSG 12/2008:  AHI 13/hr with desats to 78%    Preventative health care 06/11/2017   Sciatica    right side   Seasonal and perennial allergic rhinitis 08/28/2010   Allergy  profile 03/11/14- positive especially for dust, cat and dog Food allergy  profile- total IgE 86 with several food group elevations Sed rate-WNL      Sinusitis 06/06/2017   Sleep apnea    uses cpap   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    Labs: Review Flowsheet  More data exists      Latest Ref Rng & Units 03/05/2023 07/25/2023 01/23/2024 01/30/2024 01/31/2024  Labs for ITP Cardiac and Pulmonary Rehab  Cholestrol 0 - 200 mg/dL 888  873  840  - 877   LDL (calc) 0 - 99 mg/dL 60  75  898  - 72   HDL-C >40 mg/dL 58.39  59.49  52.69  - 42   Trlycerides <150 mg/dL 55.9  51.9  48.9  - 40   Hemoglobin A1c 4.8 - 5.6 % 5.6  5.4  5.6  -  5.1   TCO2 22 - 32 mmol/L - - - 27  -    Capillary Blood Glucose: Lab Results  Component Value Date   GLUCAP 117 (H) 03/03/2024   GLUCAP 136 (H) 01/31/2024   GLUCAP 98 01/31/2024   GLUCAP 111 (H) 01/31/2024   GLUCAP 96 01/30/2024     Exercise Target Goals: Exercise Program Goal: Individual exercise prescription set using results from initial 6 min walk test and THRR while considering  patient's activity barriers and safety.   Exercise Prescription Goal: Initial exercise prescription builds to 30-45 minutes a day of aerobic activity, 2-3 days per week.  Home exercise guidelines will be given to patient during program as part of exercise prescription that the participant will acknowledge.  Activity Barriers & Risk Stratification:  Activity Barriers & Cardiac Risk Stratification - 03/03/24 1144       Activity Barriers & Cardiac Risk Stratification   Activity Barriers Balance Concerns;History of Falls;Assistive Device;Other (comment)    Comments Possible neuropathy in legs/ feet.    Cardiac Risk Stratification High          6 Minute Walk:  6 Minute Walk     Row Name 03/03/24 1200         6 Minute Walk   Phase Initial     Distance 720 feet     Walk Time 6 minutes     # of Rest Breaks 0     MPH 1.36     METS 0.91     RPE 13     Perceived Dyspnea  2     VO2 Peak 3.18     Symptoms Yes (comment)     Comments Mild shortness of breath. Left leg and foot weakness.     Resting HR 75 bpm     Resting BP 134/78     Resting Oxygen  Saturation  97 %     Exercise Oxygen Saturation  during 6 min walk 96 %     Max Ex. HR 102 bpm     Max Ex. BP 148/68     2 Minute Post BP 150/72        Oxygen Initial Assessment:   Oxygen Re-Evaluation:   Oxygen Discharge (Final Oxygen Re-Evaluation):   Initial Exercise Prescription:  Initial Exercise Prescription - 03/03/24 1300       Date of Initial Exercise RX and Referring Provider   Date 03/03/24    Referring Provider Santo Stanly LABOR, MD    Expected Discharge Date 05/27/24      NuStep   Level 1    SPM 75    Minutes 15    METs 1.5      Prescription Details   Frequency (times per week) 3    Duration Progress to 30 minutes of continuous aerobic without signs/symptoms of physical distress      Intensity   THRR 40-80% of Max Heartrate 58-115    Ratings of Perceived Exertion 11-13    Perceived Dyspnea 0-4      Progression   Progression Continue to progress workloads to maintain intensity without signs/symptoms of physical distress.      Resistance Training   Training Prescription Yes    Weight 2 lbs    Reps 10-15          Perform Capillary Blood Glucose checks as needed.  Exercise Prescription Changes:   Exercise Prescription Changes     Row Name 03/09/24 1037 03/23/24 1033 04/06/24 1031 04/15/24 1034 05/01/24 1027  Response to Exercise   Blood Pressure (Admit) 140/78 130/70 144/60 128/60 134/70   Blood Pressure (Exercise) 156/70 130/70 -- 168/72 --   Blood Pressure (Exit) 124/70 122/62 124/70 126/82 120/66   Heart Rate (Admit) 75 bpm 77 bpm 69 bpm 93 bpm 100 bpm   Heart Rate (Exercise) 104 bpm 109 bpm 105 bpm 105 bpm 108 bpm   Heart Rate (Exit) 84 bpm 86 bpm 78 bpm 94 bpm 87 bpm   Rating of Perceived Exertion (Exercise) 9 11 8 11 9    Symptoms None None None None None   Comments Off to a good start with exercise. -- -- -- Reviewed METs and goals with [place]. Increased NuStep from level 2 to 3 the last 3.5 minutes of the station.    Duration Continue with 30 min of aerobic exercise without signs/symptoms of physical distress. Continue with 30 min of aerobic exercise without signs/symptoms of physical distress. Continue with 30 min of aerobic exercise without signs/symptoms of physical distress. Continue with 30 min of aerobic exercise without signs/symptoms of physical distress. Continue with 30 min of aerobic exercise without signs/symptoms of physical distress.   Intensity THRR unchanged THRR unchanged THRR unchanged THRR unchanged THRR unchanged     Progression   Progression Continue to progress workloads to maintain intensity without signs/symptoms of physical distress. Continue to progress workloads to maintain intensity without signs/symptoms of physical distress. Continue to progress workloads to maintain intensity without signs/symptoms of physical distress. Continue to progress workloads to maintain intensity without signs/symptoms of physical distress. Continue to progress workloads to maintain intensity without signs/symptoms of physical distress.   Average METs 1.7 2.1 2 2  2.1     Resistance Training   Training Prescription Yes Yes Yes No Yes   Weight 2 lbs 2 lbs 2 lbs Relaxation day, no weights. 2 lbs   Reps 10-15 10-15 10-15 -- 10-15   Time 5 Minutes 5 Minutes 5 Minutes -- 5 Minutes     Interval Training   Interval Training No No No No No     NuStep   Level 2 2 2 2 2    SPM 78 115 117 113 119   Minutes 30 30 30 30 30    METs 1.7 2.1 2 2  2.1     Home Exercise Plan   Plans to continue exercise at -- -- Home (comment)  Walking Home (comment)  Walking Home (comment)  Walking   Frequency -- -- Add 3 additional days to program exercise sessions. Add 3 additional days to program exercise sessions. Add 3 additional days to program exercise sessions.   Initial Home Exercises Provided -- -- 03/25/24 03/25/24 03/25/24      Exercise Comments:   Exercise Comments     Row Name 03/09/24 1135 03/25/24 1103 05/01/24  1103       Exercise Comments [place] tolerated low intensity exercise well without symptoms. Oriented her to the exercise equipment and stretching routine. Reviewed home exercise guidelines, MET, and goals with [place]. Reviewed MET and goals with [place].        Exercise Goals and Review:   Exercise Goals     Row Name 03/03/24 1144             Exercise Goals   Increase Physical Activity Yes       Intervention Provide advice, education, support and counseling about physical activity/exercise needs.;Develop an individualized exercise prescription for aerobic and resistive training based on initial evaluation findings, risk stratification, comorbidities and participant's personal goals.  Expected Outcomes Short Term: Attend rehab on a regular basis to increase amount of physical activity.;Long Term: Exercising regularly at least 3-5 days a week.;Long Term: Add in home exercise to make exercise part of routine and to increase amount of physical activity.       Increase Strength and Stamina Yes       Intervention Provide advice, education, support and counseling about physical activity/exercise needs.;Develop an individualized exercise prescription for aerobic and resistive training based on initial evaluation findings, risk stratification, comorbidities and participant's personal goals.       Expected Outcomes Short Term: Increase workloads from initial exercise prescription for resistance, speed, and METs.;Short Term: Perform resistance training exercises routinely during rehab and add in resistance training at home;Long Term: Improve cardiorespiratory fitness, muscular endurance and strength as measured by increased METs and functional capacity ( )       Able to understand and use rate of perceived exertion (RPE) scale Yes       Intervention Provide education and explanation on how to use RPE scale       Expected Outcomes Short Term: Able to use RPE daily in rehab to express subjective  intensity level;Long Term:  Able to use RPE to guide intensity level when exercising independently       Knowledge and understanding of Target Heart Rate Range (THRR) Yes       Intervention Provide education and explanation of THRR including how the numbers were predicted and where they are located for reference       Expected Outcomes Short Term: Able to state/look up THRR;Long Term: Able to use THRR to govern intensity when exercising independently;Short Term: Able to use daily as guideline for intensity in rehab       Able to check pulse independently Yes       Intervention Provide education and demonstration on how to check pulse in carotid and radial arteries.;Review the importance of being able to check your own pulse for safety during independent exercise       Expected Outcomes Short Term: Able to explain why pulse checking is important during independent exercise;Long Term: Able to check pulse independently and accurately       Understanding of Exercise Prescription Yes       Intervention Provide education, explanation, and written materials on patient's individual exercise prescription       Expected Outcomes Short Term: Able to explain program exercise prescription;Long Term: Able to explain home exercise prescription to exercise independently          Exercise Goals Re-Evaluation :  Exercise Goals Re-Evaluation     Row Name 03/09/24 1135 03/25/24 1103 04/15/24 1133 05/01/24 1103       Exercise Goal Re-Evaluation   Exercise Goals Review Increase Physical Activity;Increase Strength and Stamina;Able to understand and use rate of perceived exertion (RPE) scale Increase Physical Activity;Increase Strength and Stamina;Able to understand and use rate of perceived exertion (RPE) scale;Understanding of Exercise Prescription;Able to check pulse independently;Knowledge and understanding of Target Heart Rate Range (THRR) Increase Physical Activity;Increase Strength and Stamina;Able to understand  and use rate of perceived exertion (RPE) scale;Understanding of Exercise Prescription;Able to check pulse independently;Knowledge and understanding of Target Heart Rate Range (THRR) Increase Physical Activity;Increase Strength and Stamina;Able to understand and use rate of perceived exertion (RPE) scale;Understanding of Exercise Prescription;Able to check pulse independently;Knowledge and understanding of Target Heart Rate Range (THRR)    Comments Ruthanna was able to understand and use RPE scale appropriately. Reviewed exercise prescription with  [place]. She is walking in the park using her rollator and stretching. She walks 30 minutes, 3 days/week. She has a treadmill at home that she uses when it's too cold to walk outside. Randi is making gradual progess with exercise. Verenise has been having intermittent pain and weakness in her left leg. She's been evaluated for neuropathy, but doesn't appear to have that issue. She plans to seek a referral to an orthopedic surgeon for further evaluation. She states the NuStep and exercise at cardiac rehab doesn't not worsen her problem. She is using her seated foot bike and elliptical machine at home. She also has a treadmill but is not currently using it. She is only able to tolerate the elliptical for 8 minutes due to dificulty. She also has access to 1-4 lb weights. She is exercising at home Tues, Thurs, and one day during the weekend in addition to exercise at cardiac rehab.    Expected Outcomes Progress workloads as tolerated to help improve cardiorespiratory fitness. Bethan will continue walking at home in addition to exercise at cardiac rehab. Progress workloads as tolerated. Imaan will continue exercise 6 days/week to help increase strength especially leg strength.       Discharge Exercise Prescription (Final Exercise Prescription Changes):  Exercise Prescription Changes - 05/01/24 1027       Response to Exercise   Blood Pressure (Admit) 134/70    Blood  Pressure (Exit) 120/66    Heart Rate (Admit) 100 bpm    Heart Rate (Exercise) 108 bpm    Heart Rate (Exit) 87 bpm    Rating of Perceived Exertion (Exercise) 9    Symptoms None    Comments Reviewed METs and goals with [place]. Increased NuStep from level 2 to 3 the last 3.5 minutes of the station.    Duration Continue with 30 min of aerobic exercise without signs/symptoms of physical distress.    Intensity THRR unchanged      Progression   Progression Continue to progress workloads to maintain intensity without signs/symptoms of physical distress.    Average METs 2.1      Resistance Training   Training Prescription Yes    Weight 2 lbs    Reps 10-15    Time 5 Minutes      Interval Training   Interval Training No      NuStep   Level 2    SPM 119    Minutes 30    METs 2.1      Home Exercise Plan   Plans to continue exercise at Home (comment)   Walking   Frequency Add 3 additional days to program exercise sessions.    Initial Home Exercises Provided 03/25/24          Nutrition:  Target Goals: Understanding of nutrition guidelines, daily intake of sodium 1500mg , cholesterol 200mg , calories 30% from fat and 7% or less from saturated fats, daily to have 5 or more servings of fruits and vegetables.  Biometrics:  Pre Biometrics - 03/03/24 1058       Pre Biometrics   Waist Circumference 51.25 inches    Hip Circumference 53.25 inches    Waist to Hip Ratio 0.96 %    Triceps Skinfold 52 mm    % Body Fat 57.3 %    Grip Strength 8 kg    Flexibility 0 in    Single Leg Stand 2.87 seconds           Nutrition Therapy Plan and Nutrition Goals:  Nutrition Therapy &  Goals - 05/04/24 1140       Nutrition Therapy   Diet Heart healthy Diet    Drug/Food Interactions Statins/Certain Fruits      Personal Nutrition Goals   Nutrition Goal Patient to identify strategies for reducing cardiovascular risk by attending the [facility] education and nutrition series weekly.   goal in  action.   Personal Goal #2 Patient to improve diet quality by using the plate method as a guide for meal planning to include lean protein/plant protein, fruits, vegetables, whole grains, nonfat dairy as part of a well-balanced diet.   goal in progress.   Comments Goals in progress. Patient has medical history of STEMI, HFpEF, hyperlipidemia.She has started to regularly attend the [facility] education/nutrition series.  LDL is 72 with ideal goal <44. 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 She is up  2.2# since starting with our program. Patient will benefit from participation in intensive cardiac rehab and adherence to nutrition, exercise, and lifestyle modification.      Intervention Plan   Intervention Prescribe, educate and counsel regarding individualized specific dietary modifications aiming towards targeted core components such as weight, hypertension, lipid management, diabetes, heart failure and other comorbidities.;Nutrition handout(s) given to patient.    Expected Outcomes Short Term Goal: Understand basic principles of dietary content, such as calories, fat, sodium, cholesterol and nutrients.;Long Term Goal: Adherence to prescribed nutrition plan.          Nutrition Assessments:  Nutrition Assessments - 03/09/24 1147       Rate Your Plate Scores   Pre Score 73         MEDIFICTS Score Key: >=70 Need to make dietary changes  40-70 Heart Healthy Diet <= 40 Therapeutic Level Cholesterol Diet   Flowsheet Row INTENSIVE CARDIAC REHAB from 03/09/2024 in [facility], [facility], & [facility]  Picture Your Plate Total Score on Admission 73   Picture Your Plate Scores: <59 Unhealthy dietary pattern with much room for improvement. 41-50 Dietary pattern unlikely to meet recommendations for good health and room for improvement. 51-60 More healthful dietary  pattern, with some room for improvement.  >60 Healthy dietary pattern, although there may be some specific behaviors that could be improved.    Nutrition Goals Re-Evaluation:  Nutrition Goals Re-Evaluation     Row Name 03/09/24 1103 04/06/24 1128 05/04/24 1140         Goals   Current Weight 218 lb 14.7 oz (99.3 kg) 222 lb 10.6 oz (101 kg) 221 lb 1.9 oz (100.3 kg)     Comment A1c WNL, LDL 72 (goals <70 but ideally <55), HDL 42 A1c WNL, LDL 72 (goals <70 but ideally <55), HDL 42 no new labs; most recent labs A1c WNL, LDL 72 (goals <70 but ideally <55), HDL 42     Expected Outcome Patient has medical history of STEMI, HFpEF, hyperlipidemia. LDL is 72 with ideal goal <44. 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 Patient will benefit from participation in intensive cardiac rehab for nutrition, exercise, and lifestyle modification. Patient has medical history of STEMI, HFpEF, hyperlipidemia.She does not regularly attend the [facility] education/nutrition series. LDL is 72 with ideal goal <44. 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 She is up 3.7# since starting with our program. Patient will benefit from participation  in intensive cardiac rehab for nutrition, exercise, and lifestyle modification. Goals in progress. Patient has medical history of STEMI, HFpEF, hyperlipidemia.She has started to regularly attend the [facility] education/nutrition series. LDL is 72 with ideal goal <44. 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 She is up 2.2# since starting with our program. Patient will benefit from participation in intensive cardiac rehab and adherence to nutrition, exercise, and lifestyle modification.        Nutrition Goals  Re-Evaluation:  Nutrition Goals Re-Evaluation     Row Name 03/09/24 1103 04/06/24 1128 05/04/24 1140         Goals   Current Weight 218 lb 14.7 oz (99.3 kg) 222 lb 10.6 oz (101 kg) 221 lb 1.9 oz (100.3 kg)     Comment A1c WNL, LDL 72 (goals <70 but ideally <55), HDL 42 A1c WNL, LDL 72 (goals <70 but ideally <55), HDL 42 no new labs; most recent labs A1c WNL, LDL 72 (goals <70 but ideally <55), HDL 42     Expected Outcome Patient has medical history of STEMI, HFpEF, hyperlipidemia. LDL is 72 with ideal goal <44. 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 Patient will benefit from participation in intensive cardiac rehab for nutrition, exercise, and lifestyle modification. Patient has medical history of STEMI, HFpEF, hyperlipidemia.She does not regularly attend the [facility] education/nutrition series. LDL is 72 with ideal goal <44. 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 She is up 3.7# since starting with our program. Patient will benefit from participation in intensive cardiac rehab for nutrition, exercise, and lifestyle modification. Goals in progress. Patient has medical history of STEMI, HFpEF, hyperlipidemia.She has started to regularly attend the [facility] education/nutrition series. LDL is 72 with ideal goal <44. 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 She is up 2.2# since starting with our program. Patient will benefit from participation in intensive cardiac rehab and adherence to nutrition, exercise, and lifestyle modification.        Nutrition Goals Discharge (Final Nutrition Goals Re-Evaluation):  Nutrition Goals Re-Evaluation - 05/04/24 1140       Goals   Current Weight 221 lb 1.9 oz (100.3 kg)    Comment no  new labs; most recent labs A1c WNL, LDL 72 (goals <70 but ideally <55), HDL 42    Expected Outcome Goals in progress. Patient has medical history of STEMI, HFpEF, hyperlipidemia.She has started to regularly attend the [facility] education/nutrition series. LDL is 72 with ideal goal <44. 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 She is up 2.2# since starting with our program. Patient will benefit from participation in intensive cardiac rehab and adherence to nutrition, exercise, and lifestyle modification.          Psychosocial: Target Goals: Acknowledge presence or absence of significant depression and/or stress, maximize coping skills, provide positive support system. Participant is able to verbalize types and ability to use techniques and skills needed for reducing stress and depression.  Initial Review & Psychosocial Screening:  Initial Psych Review & Screening - 03/03/24 1100       Initial Review   Current issues with History of Depression      Family Dynamics   Good Support System? Yes  Alaina live with her daughter and her 2 sons   Comments Ebone lost her husband 4 years ago and her daughter 2 years ago from ALS she was 51 years old      Barriers   Psychosocial barriers to participate in program The patient should benefit from training in stress management and relaxation.      Screening Interventions   Interventions Encouraged to exercise;To provide support and resources with identified psychosocial needs;Provide feedback about the scores to participant    Expected Outcomes Short Term goal: Utilizing psychosocial counselor, staff and physician to assist with identification of specific Stressors or current issues interfering with healing process. Setting desired goal for each stressor or current issue identified.;Long Term Goal: Stressors or current issues are controlled or eliminated.;Short  Term goal: Identification and review with participant of any Quality of Life or Depression concerns found by scoring the questionnaire.;Long Term goal: The participant improves quality of Life and PHQ9 Scores as seen by post scores and/or verbalization of changes          Quality of Life Scores:  Quality of Life - 03/03/24 1402       Quality of Life   Select Quality of Life      Quality of Life Scores   Health/Function Pre 25.03 %    Socioeconomic Pre 26.29 %    Psych/Spiritual Pre 28.21 %    Family Pre 30 %    GLOBAL Pre 26.58 %         Scores of 19 and below usually indicate a poorer quality of life in these areas.  A difference of  2-3 points is a clinically meaningful difference.  A difference of 2-3 points in the total score of the Quality of Life Index has been associated with significant improvement in overall quality of life, self-image, physical symptoms, and general health in studies assessing change in quality of life.  PHQ-9: Review Flowsheet  More data exists      03/03/2024 02/05/2024 07/25/2023 03/05/2023 10/23/2022  Depression screen PHQ 2/9  Decreased Interest 0 0 0 0 0  Down, Depressed, Hopeless 0 0 0 0 0  PHQ - 2 Score 0 0 0 0 0  Altered sleeping 2 - - 0 -  Tired, decreased energy 2 - - 0 -  Change in appetite 0 - - 0 -  Feeling bad or failure about yourself  0 - - 0 -  Trouble concentrating 0 - - 0 -  Moving slowly or fidgety/restless 0 - - 0 -  Suicidal thoughts 0 - - 0 -  PHQ-9 Score 4 - - 0 -  Difficult doing work/chores Somewhat difficult - - Not difficult at all -   Interpretation of Total Score  Total Score Depression Severity:  1-4 = Minimal depression, 5-9 = Mild depression, 10-14 = Moderate depression, 15-19 = Moderately severe depression, 20-27 = Severe depression   Psychosocial Evaluation and Intervention:   Psychosocial Re-Evaluation:  Psychosocial Re-Evaluation     Row Name 03/09/24 1741 03/31/24 1424 05/05/24 1424          Psychosocial Re-Evaluation   Current issues with History of Depression;Current Sleep Concerns History of Depression;Current Sleep Concerns History of Depression;Current Sleep Concerns     Comments Mithra did not voice any increased concerns or stressors. Lusia did says she has trouble sleeping at orientation and sleeps in her recliner. Epsie has not  not voiced any increased concerns or stressors during exercise at cardiac rehab. Harlem says she is  enjoying participating in the porgram. Devetta continues not to voice any increased concerns or stressors during exercise at cardiac rehab. Lisanne says she is enjoying participating in the porgram.     Expected Outcomes -- Livie will have controlled or decreased depression upon completion of cardiac rehab Skylen will have controlled or decreased depression upon completion of cardiac rehab     Interventions Stress management education;Encouraged to attend Cardiac Rehabilitation for the exercise;Relaxation education Stress management education;Encouraged to attend Cardiac Rehabilitation for the exercise;Relaxation education Stress management education;Encouraged to attend Cardiac Rehabilitation for the exercise;Relaxation education     Continue Psychosocial Services  Follow up required by staff Follow up required by staff No Follow up required        Psychosocial Discharge (Final Psychosocial Re-Evaluation):  Psychosocial Re-Evaluation - 05/05/24 1424       Psychosocial Re-Evaluation   Current issues with History of Depression;Current Sleep Concerns    Comments Jessikah continues not to voice any increased concerns or stressors during exercise at cardiac rehab. Kailie says she is enjoying participating in the porgram.    Expected Outcomes Ashleynicole will have controlled or decreased depression upon completion of cardiac rehab    Interventions Stress management education;Encouraged to attend Cardiac Rehabilitation for the exercise;Relaxation education     Continue Psychosocial Services  No Follow up required          Vocational Rehabilitation: Provide vocational rehab assistance to qualifying candidates.   Vocational Rehab Evaluation & Intervention:  Vocational Rehab - 03/03/24 1206       Initial Vocational Rehab Evaluation & Intervention   Assessment shows need for Vocational Rehabilitation No   Ari is retired and does not need vocational rehab at this time         Education: Education Goals: Education classes will be provided on a weekly basis, covering required topics. Participant will state understanding/return demonstration of topics presented.    Education     Row Name 03/09/24 1400     Education   Cardiac Education Topics Pritikin   Geographical information systems officer Psychosocial   Psychosocial Workshop Focused Goals, Sustainable Changes   Instruction Review Code 1- Verbalizes Understanding   Class Start Time 1155   Class Stop Time 1235   Class Time Calculation (min) 40 min    Row Name 03/11/24 1300     Education   Cardiac Education Topics Pritikin   Customer service manager   Weekly Topic Comforting Weekend Breakfasts   Instruction Review Code 1- Verbalizes Understanding   Class Start Time 1145   Class Stop Time 1220   Class Time Calculation (min) 35 min    Row Name 03/13/24 1100     Education   Cardiac Education Topics Pritikin   Nurse, children's Exercise Physiologist   Select Nutrition   Nutrition Dining Out - Part 1   Instruction Review Code 1- Verbalizes Understanding   Class Start Time 1147   Class Stop Time 1226   Class Time Calculation (min) 39 min    Row Name 03/16/24 1100     Education   Cardiac Education Topics Pritikin   Psychologist, forensic Exercise Education   Exercise Education Biomechanial Limitations    Instruction Review Code 1- Verbalizes Understanding  Class Start Time 1155   Class Stop Time 1235   Class Time Calculation (min) 40 min    Row Name 03/20/24 1100     Education   Cardiac Education Topics --   Select --     Core Videos   Educator --   Select --   Nutrition --   Instruction Review Code --    Row Name 03/23/24 1600     Education   Cardiac Education Topics Pritikin   Glass blower/designer Nutrition   Nutrition Workshop Fueling a Forensic psychologist   Instruction Review Code 1- TEFL teacher Understanding   Class Start Time 1145   Class Stop Time 1230   Class Time Calculation (min) 45 min    Row Name 03/30/24 1300     Education   Cardiac Education Topics Pritikin   Geographical information systems officer Psychosocial   Psychosocial Workshop Healthy Sleep for a Healthy Heart   Instruction Review Code 1- Verbalizes Understanding   Class Start Time 1150   Class Stop Time 1230   Class Time Calculation (min) 40 min    Row Name 04/01/24 1100     Education   Cardiac Education Topics Pritikin   Customer service manager   Weekly Topic Simple Sides and Sauces   Instruction Review Code 1- Verbalizes Understanding   Class Start Time 1145   Class Stop Time 1225   Class Time Calculation (min) 40 min    Row Name 04/03/24 1100     Education   Cardiac Education Topics Pritikin   Nurse, children's Exercise Physiologist   Select Psychosocial   Psychosocial How Our Thoughts Can Heal Our Hearts   Instruction Review Code 1- Verbalizes Understanding   Class Start Time 1153   Class Stop Time 1241   Class Time Calculation (min) 48 min    Row Name 04/06/24 1100     Education   Cardiac Education Topics Pritikin   Hospital doctor Education   General  Education Hypertension and Heart Disease   Instruction Review Code 1- Verbalizes Understanding   Class Start Time 1150   Class Stop Time 1225   Class Time Calculation (min) 35 min    Row Name 04/10/24 1300     Education   Cardiac Education Topics Pritikin   Select Workshops     Workshops   Educator Exercise Physiologist   Select Exercise   Exercise Workshop Managing Heart Disease: Your Path to a Healthier Heart   Instruction Review Code 1- Verbalizes Understanding   Class Start Time 1148   Class Stop Time 1239   Class Time Calculation (min) 51 min    Row Name 04/13/24 1300     Education   Cardiac Education Topics Pritikin   Geographical information systems officer Psychosocial   Psychosocial Workshop From Head to Heart: The Power of a Healthy Outlook   Instruction Review Code 1- Verbalizes Understanding   Class Start Time 1152   Class Stop Time 1234   Class Time Calculation (min) 42 min    Row Name 04/15/24 1100     Education   Cardiac  Education Topics Management consultant and Snacks   Instruction Review Code 1- Verbalizes Understanding   Class Start Time 1145   Class Stop Time 1225   Class Time Calculation (min) 40 min    Row Name 04/29/24 1000     Education   Cardiac Education Topics Pritikin   Customer service manager   Weekly Topic Fast and Healthy Breakfasts   Instruction Review Code 1- Verbalizes Understanding   Class Start Time 1145   Class Stop Time 1220   Class Time Calculation (min) 35 min    Row Name 05/01/24 1000     Education   Cardiac Education Topics Pritikin   Licensed conveyancer Nutrition   Nutrition Other   Instruction Review Code 1- Verbalizes Understanding   Class Start Time 1145   Class  Stop Time 1225   Class Time Calculation (min) 40 min    Row Name 05/04/24 1100     Education   Cardiac Education Topics Pritikin   Hospital doctor Education   General Education Metabolic Syndrome and Belly Fat   Instruction Review Code 1- Verbalizes Understanding   Class Start Time 1200   Class Stop Time 1235   Class Time Calculation (min) 35 min      Core Videos: Exercise    Move It!  Clinical staff conducted group or individual video education with verbal and written material and guidebook.  Patient learns the recommended Pritikin exercise program. Exercise with the goal of living a long, healthy life. Some of the health benefits of exercise include controlled diabetes, healthier blood pressure levels, improved cholesterol levels, improved heart and lung capacity, improved sleep, and better body composition. Everyone should speak with their doctor before starting or changing an exercise routine.  Biomechanical Limitations Clinical staff conducted group or individual video education with verbal and written material and guidebook.  Patient learns how biomechanical limitations can impact exercise and how we can mitigate and possibly overcome limitations to have an impactful and balanced exercise routine.  Body Composition Clinical staff conducted group or individual video education with verbal and written material and guidebook.  Patient learns that body composition (ratio of muscle mass to fat mass) is a key component to assessing overall fitness, rather than body weight alone. Increased fat mass, especially visceral belly fat, can put us  at increased risk for metabolic syndrome, type 2 diabetes, heart disease, and even death. It is recommended to combine diet and exercise (cardiovascular and resistance training) to improve your body composition. Seek guidance from your physician and exercise physiologist before  implementing an exercise routine.  Exercise Action Plan Clinical staff conducted group or individual video education with verbal and written material and guidebook.  Patient learns the recommended strategies to achieve and enjoy long-term exercise adherence, including variety, self-motivation, self-efficacy, and positive decision making. Benefits of exercise include fitness, good health, weight management, more energy, better sleep, less stress, and overall well-being.  Medical   Heart Disease Risk Reduction Clinical staff conducted group or individual video education with verbal and written material and guidebook.  Patient learns our heart is our most vital organ  as it circulates oxygen, nutrients, white blood cells, and hormones throughout the entire body, and carries waste away. Data supports a plant-based eating plan like the Pritikin Program for its effectiveness in slowing progression of and reversing heart disease. The video provides a number of recommendations to address heart disease.   Metabolic Syndrome and Belly Fat  Clinical staff conducted group or individual video education with verbal and written material and guidebook.  Patient learns what metabolic syndrome is, how it leads to heart disease, and how one can reverse it and keep it from coming back. You have metabolic syndrome if you have 3 of the following 5 criteria: abdominal obesity, high blood pressure, high triglycerides, low HDL cholesterol, and high blood sugar.  Hypertension and Heart Disease Clinical staff conducted group or individual video education with verbal and written material and guidebook.  Patient learns that high blood pressure, or hypertension, is very common in the [place] . Hypertension is largely due to excessive salt intake, but other important risk factors include being overweight, physical inactivity, drinking too much alcohol, smoking, and not eating enough potassium from fruits and vegetables. High  blood pressure is a leading risk factor for heart attack, stroke, congestive heart failure, dementia, kidney failure, and premature death. Long-term effects of excessive salt intake include stiffening of the arteries and thickening of heart muscle and organ damage. Recommendations include ways to reduce hypertension and the risk of heart disease.  Diseases of Our Time - Focusing on Diabetes Clinical staff conducted group or individual video education with verbal and written material and guidebook.  Patient learns why the best way to stop diseases of our time is prevention, through food and other lifestyle changes. Medicine (such as prescription pills and surgeries) is often only a Band-Aid on the problem, not a long-term solution. Most common diseases of our time include obesity, type 2 diabetes, hypertension, heart disease, and cancer. The Pritikin Program is recommended and has been proven to help reduce, reverse, and/or prevent the damaging effects of metabolic syndrome.  Nutrition   Overview of the Pritikin Eating Plan  Clinical staff conducted group or individual video education with verbal and written material and guidebook.  Patient learns about the Pritikin Eating Plan for disease risk reduction. The Pritikin Eating Plan emphasizes a wide variety of unrefined, minimally-processed carbohydrates, like fruits, vegetables, whole grains, and legumes. Go, Caution, and Stop food choices are explained. Plant-based and lean animal proteins are emphasized. Rationale provided for low sodium intake for blood pressure control, low added sugars for blood sugar stabilization, and low added fats and oils for coronary artery disease risk reduction and weight management.  Calorie Density  Clinical staff conducted group or individual video education with verbal and written material and guidebook.  Patient learns about calorie density and how it impacts the Pritikin Eating Plan. Knowing the characteristics of the  food you choose will help you decide whether those foods will lead to weight gain or weight loss, and whether you want to consume more or less of them. Weight loss is usually a side effect of the Pritikin Eating Plan because of its focus on low calorie-dense foods.  Label Reading  Clinical staff conducted group or individual video education with verbal and written material and guidebook.  Patient learns about the Pritikin recommended label reading guidelines and corresponding recommendations regarding calorie density, added sugars, sodium content, and whole grains.  Dining Out - Part 1  Clinical staff conducted group or individual video education with verbal and written  material and guidebook.  Patient learns that restaurant meals can be sabotaging because they can be so high in calories, fat, sodium, and/or sugar. Patient learns recommended strategies on how to positively address this and avoid unhealthy pitfalls.  Facts on Fats  Clinical staff conducted group or individual video education with verbal and written material and guidebook.  Patient learns that lifestyle modifications can be just as effective, if not more so, as many medications for lowering your risk of heart disease. A Pritikin lifestyle can help to reduce your risk of inflammation and atherosclerosis (cholesterol build-up, or plaque, in the artery walls). Lifestyle interventions such as dietary choices and physical activity address the cause of atherosclerosis. A review of the types of fats and their impact on blood cholesterol levels, along with dietary recommendations to reduce fat intake is also included.  Nutrition Action Plan  Clinical staff conducted group or individual video education with verbal and written material and guidebook.  Patient learns how to incorporate Pritikin recommendations into their lifestyle. Recommendations include planning and keeping personal health goals in mind as an important part of their  success.  Healthy Mind-Set    Healthy Minds, Bodies, Hearts  Clinical staff conducted group or individual video education with verbal and written material and guidebook.  Patient learns how to identify when they are stressed. Video will discuss the impact of that stress, as well as the many benefits of stress management. Patient will also be introduced to stress management techniques. The way we think, act, and feel has an impact on our hearts.  How Our Thoughts Can Heal Our Hearts  Clinical staff conducted group or individual video education with verbal and written material and guidebook.  Patient learns that negative thoughts can cause depression and anxiety. This can result in negative lifestyle behavior and serious health problems. Cognitive behavioral therapy is an effective method to help control our thoughts in order to change and improve our emotional outlook.  Additional Videos:  Exercise    Improving Performance  Clinical staff conducted group or individual video education with verbal and written material and guidebook.  Patient learns to use a non-linear approach by alternating intensity levels and lengths of time spent exercising to help burn more calories and lose more body fat. Cardiovascular exercise helps improve heart health, metabolism, hormonal balance, blood sugar control, and recovery from fatigue. Resistance training improves strength, endurance, balance, coordination, reaction time, metabolism, and muscle mass. Flexibility exercise improves circulation, posture, and balance. Seek guidance from your physician and exercise physiologist before implementing an exercise routine and learn your capabilities and proper form for all exercise.  Introduction to Yoga  Clinical staff conducted group or individual video education with verbal and written material and guidebook.  Patient learns about yoga, a discipline of the coming together of mind, breath, and body. The benefits of yoga  include improved flexibility, improved range of motion, better posture and core strength, increased lung function, weight loss, and positive self-image. Yoga's heart health benefits include lowered blood pressure, healthier heart rate, decreased cholesterol and triglyceride levels, improved immune function, and reduced stress. Seek guidance from your physician and exercise physiologist before implementing an exercise routine and learn your capabilities and proper form for all exercise.  Medical   Aging: Enhancing Your Quality of Life  Clinical staff conducted group or individual video education with verbal and written material and guidebook.  Patient learns key strategies and recommendations to stay in good physical health and enhance quality of life, such as prevention  strategies, having an advocate, securing a Health Care Proxy and Power of Attorney, and keeping a list of medications and system for tracking them. It also discusses how to avoid risk for bone loss.  Biology of Weight Control  Clinical staff conducted group or individual video education with verbal and written material and guidebook.  Patient learns that weight gain occurs because we consume more calories than we burn (eating more, moving less). Even if your body weight is normal, you may have higher ratios of fat compared to muscle mass. Too much body fat puts you at increased risk for cardiovascular disease, heart attack, stroke, type 2 diabetes, and obesity-related cancers. In addition to exercise, following the Pritikin Eating Plan can help reduce your risk.  Decoding Lab Results  Clinical staff conducted group or individual video education with verbal and written material and guidebook.  Patient learns that lab test reflects one measurement whose values change over time and are influenced by many factors, including medication, stress, sleep, exercise, food, hydration, pre-existing medical conditions, and more. It is recommended to  use the knowledge from this video to become more involved with your lab results and evaluate your numbers to speak with your doctor.   Diseases of Our Time - Overview  Clinical staff conducted group or individual video education with verbal and written material and guidebook.  Patient learns that according to the CDC, 50% to 70% of chronic diseases (such as obesity, type 2 diabetes, elevated lipids, hypertension, and heart disease) are avoidable through lifestyle improvements including healthier food choices, listening to satiety cues, and increased physical activity.  Sleep Disorders Clinical staff conducted group or individual video education with verbal and written material and guidebook.  Patient learns how good quality and duration of sleep are important to overall health and well-being. Patient also learns about sleep disorders and how they impact health along with recommendations to address them, including discussing with a physician.  Nutrition  Dining Out - Part 2 Clinical staff conducted group or individual video education with verbal and written material and guidebook.  Patient learns how to plan ahead and communicate in order to maximize their dining experience in a healthy and nutritious manner. Included are recommended food choices based on the type of restaurant the patient is visiting.   Fueling a Banker conducted group or individual video education with verbal and written material and guidebook.  There is a strong connection between our food choices and our health. Diseases like obesity and type 2 diabetes are very prevalent and are in large-part due to lifestyle choices. The Pritikin Eating Plan provides plenty of food and hunger-curbing satisfaction. It is easy to follow, affordable, and helps reduce health risks.  Menu Workshop  Clinical staff conducted group or individual video education with verbal and written material and guidebook.  Patient learns  that restaurant meals can sabotage health goals because they are often packed with calories, fat, sodium, and sugar. Recommendations include strategies to plan ahead and to communicate with the manager, chef, or server to help order a healthier meal.  Planning Your Eating Strategy  Clinical staff conducted group or individual video education with verbal and written material and guidebook.  Patient learns about the Pritikin Eating Plan and its benefit of reducing the risk of disease. The Pritikin Eating Plan does not focus on calories. Instead, it emphasizes high-quality, nutrient-rich foods. By knowing the characteristics of the foods, we choose, we can determine their calorie density and make informed decisions.  Targeting Your Nutrition Priorities  Clinical staff conducted group or individual video education with verbal and written material and guidebook.  Patient learns that lifestyle habits have a tremendous impact on disease risk and progression. This video provides eating and physical activity recommendations based on your personal health goals, such as reducing LDL cholesterol, losing weight, preventing or controlling type 2 diabetes, and reducing high blood pressure.  Vitamins and Minerals  Clinical staff conducted group or individual video education with verbal and written material and guidebook.  Patient learns different ways to obtain key vitamins and minerals, including through a recommended healthy diet. It is important to discuss all supplements you take with your doctor.   Healthy Mind-Set    Smoking Cessation  Clinical staff conducted group or individual video education with verbal and written material and guidebook.  Patient learns that cigarette smoking and tobacco addiction pose a serious health risk which affects millions of people. Stopping smoking will significantly reduce the risk of heart disease, lung disease, and many forms of cancer. Recommended strategies for quitting  are covered, including working with your doctor to develop a successful plan.  Culinary   Becoming a Set designer conducted group or individual video education with verbal and written material and guidebook.  Patient learns that cooking at home can be healthy, cost-effective, quick, and puts them in control. Keys to cooking healthy recipes will include looking at your recipe, assessing your equipment needs, planning ahead, making it simple, choosing cost-effective seasonal ingredients, and limiting the use of added fats, salts, and sugars.  Cooking - Breakfast and Snacks  Clinical staff conducted group or individual video education with verbal and written material and guidebook.  Patient learns how important breakfast is to satiety and nutrition through the entire day. Recommendations include key foods to eat during breakfast to help stabilize blood sugar levels and to prevent overeating at meals later in the day. Planning ahead is also a key component.  Cooking - Educational psychologist conducted group or individual video education with verbal and written material and guidebook.  Patient learns eating strategies to improve overall health, including an approach to cook more at home. Recommendations include thinking of animal protein as a side on your plate rather than center stage and focusing instead on lower calorie dense options like vegetables, fruits, whole grains, and plant-based proteins, such as beans. Making sauces in large quantities to freeze for later and leaving the skin on your vegetables are also recommended to maximize your experience.  Cooking - Healthy Salads and Dressing Clinical staff conducted group or individual video education with verbal and written material and guidebook.  Patient learns that vegetables, fruits, whole grains, and legumes are the foundations of the Pritikin Eating Plan. Recommendations include how to incorporate each of these in  flavorful and healthy salads, and how to create homemade salad dressings. Proper handling of ingredients is also covered. Cooking - Soups and [employer] - Soups and Desserts Clinical staff conducted group or individual video education with verbal and written material and guidebook.  Patient learns that Pritikin soups and desserts make for easy, nutritious, and delicious snacks and meal components that are low in sodium, fat, sugar, and calorie density, while high in vitamins, minerals, and filling fiber. Recommendations include simple and healthy ideas for soups and desserts.   Overview     The Pritikin Solution Program Overview Clinical staff conducted group or individual video education with verbal and written material and  guidebook.  Patient learns that the results of the Pritikin Program have been documented in more than 100 articles published in peer-reviewed journals, and the benefits include reducing risk factors for (and, in some cases, even reversing) high cholesterol, high blood pressure, type 2 diabetes, obesity, and more! An overview of the three key pillars of the Pritikin Program will be covered: eating well, doing regular exercise, and having a healthy mind-set.  WORKSHOPS  Exercise: Exercise Basics: Building Your Action Plan Clinical staff led group instruction and group discussion with PowerPoint presentation and patient guidebook. To enhance the learning environment the use of posters, models and videos may be added. At the conclusion of this workshop, patients will comprehend the difference between physical activity and exercise, as well as the benefits of incorporating both, into their routine. Patients will understand the FITT (Frequency, Intensity, Time, and Type) principle and how to use it to build an exercise action plan. In addition, safety concerns and other considerations for exercise and cardiac rehab will be addressed by the presenter. The purpose of this  lesson is to promote a comprehensive and effective weekly exercise routine in order to improve patients' overall level of fitness.   Managing Heart Disease: Your Path to a Healthier Heart Clinical staff led group instruction and group discussion with PowerPoint presentation and patient guidebook. To enhance the learning environment the use of posters, models and videos may be added.At the conclusion of this workshop, patients will understand the anatomy and physiology of the heart. Additionally, they will understand how Pritikin's three pillars impact the risk factors, the progression, and the management of heart disease.  The purpose of this lesson is to provide a high-level overview of the heart, heart disease, and how the Pritikin lifestyle positively impacts risk factors.  Exercise Biomechanics Clinical staff led group instruction and group discussion with PowerPoint presentation and patient guidebook. To enhance the learning environment the use of posters, models and videos may be added. Patients will learn how the structural parts of their bodies function and how these functions impact their daily activities, movement, and exercise. Patients will learn how to promote a neutral spine, learn how to manage pain, and identify ways to improve their physical movement in order to promote healthy living. The purpose of this lesson is to expose patients to common physical limitations that impact physical activity. Participants will learn practical ways to adapt and manage aches and pains, and to minimize their effect on regular exercise. Patients will learn how to maintain good posture while sitting, walking, and lifting.  Balance Training and Fall Prevention  Clinical staff led group instruction and group discussion with PowerPoint presentation and patient guidebook. To enhance the learning environment the use of posters, models and videos may be added. At the conclusion of this workshop,  patients will understand the importance of their sensorimotor skills (vision, proprioception, and the vestibular system) in maintaining their ability to balance as they age. Patients will apply a variety of balancing exercises that are appropriate for their current level of function. Patients will understand the common causes for poor balance, possible solutions to these problems, and ways to modify their physical environment in order to minimize their fall risk. The purpose of this lesson is to teach patients about the importance of maintaining balance as they age and ways to minimize their risk of falling.  WORKSHOPS   Nutrition:  Fueling a Ship broker led group instruction and group discussion with PowerPoint presentation and patient guidebook. To enhance  the learning environment the use of posters, models and videos may be added. Patients will review the foundational principles of the Pritikin Eating Plan and understand what constitutes a serving size in each of the food groups. Patients will also learn Pritikin-friendly foods that are better choices when away from home and review make-ahead meal and snack options. Calorie density will be reviewed and applied to three nutrition priorities: weight maintenance, weight loss, and weight gain. The purpose of this lesson is to reinforce (in a group setting) the key concepts around what patients are recommended to eat and how to apply these guidelines when away from home by planning and selecting Pritikin-friendly options. Patients will understand how calorie density may be adjusted for different weight management goals.  Mindful Eating  Clinical staff led group instruction and group discussion with PowerPoint presentation and patient guidebook. To enhance the learning environment the use of posters, models and videos may be added. Patients will briefly review the concepts of the Pritikin Eating Plan and the importance of low-calorie dense  foods. The concept of mindful eating will be introduced as well as the importance of paying attention to internal hunger signals. Triggers for non-hunger eating and techniques for dealing with triggers will be explored. The purpose of this lesson is to provide patients with the opportunity to review the basic principles of the Pritikin Eating Plan, discuss the value of eating mindfully and how to measure internal cues of hunger and fullness using the Hunger Scale. Patients will also discuss reasons for non-hunger eating and learn strategies to use for controlling emotional eating.  Targeting Your Nutrition Priorities Clinical staff led group instruction and group discussion with PowerPoint presentation and patient guidebook. To enhance the learning environment the use of posters, models and videos may be added. Patients will learn how to determine their genetic susceptibility to disease by reviewing their family history. Patients will gain insight into the importance of diet as part of an overall healthy lifestyle in mitigating the impact of genetics and other environmental insults. The purpose of this lesson is to provide patients with the opportunity to assess their personal nutrition priorities by looking at their family history, their own health history and current risk factors. Patients will also be able to discuss ways of prioritizing and modifying the Pritikin Eating Plan for their highest risk areas  Menu  Clinical staff led group instruction and group discussion with PowerPoint presentation and patient guidebook. To enhance the learning environment the use of posters, models and videos may be added. Using menus brought in from [employer], or printed from [employer], patients will apply the Pritikin dining out guidelines that were presented in the [employer] video. Patients will also be able to practice these guidelines in a variety of provided scenarios. The purpose of  this lesson is to provide patients with the opportunity to practice hands-on learning of the Pritikin Dining Out guidelines with actual menus and practice scenarios.  Label Reading Clinical staff led group instruction and group discussion with PowerPoint presentation and patient guidebook. To enhance the learning environment the use of posters, models and videos may be added. Patients will review and discuss the Pritikin label reading guidelines presented in Pritikin's Label Reading Educational series video. Using fool labels brought in from local grocery stores and markets, patients will apply the label reading guidelines and determine if the packaged food meet the Pritikin guidelines. The purpose of this lesson is to provide patients with the opportunity to review, discuss,  and practice hands-on learning of the Pritikin Label Reading guidelines with actual packaged food labels. Cooking School  Pritikin's [employer] are designed to teach patients ways to prepare quick, simple, and affordable recipes at home. The importance of nutrition's role in chronic disease risk reduction is reflected in its emphasis in the overall Pritikin program. By learning how to prepare essential core Pritikin Eating Plan recipes, patients will increase control over what they eat; be able to customize the flavor of foods without the use of added salt, sugar, or fat; and improve the quality of the food they consume. By learning a set of core recipes which are easily assembled, quickly prepared, and affordable, patients are more likely to prepare more healthy foods at home. These workshops focus on convenient breakfasts, simple entres, side dishes, and desserts which can be prepared with minimal effort and are consistent with nutrition recommendations for cardiovascular risk reduction. Cooking [employer] are taught by a Armed forces logistics/support/administrative officer (RD) who has been trained by the [employer]. The  chef or RD has a clear understanding of the importance of minimizing - if not completely eliminating - added fat, sugar, and sodium in recipes. Throughout the series of Cooking School Workshop sessions, patients will learn about healthy ingredients and efficient methods of cooking to build confidence in their capability to prepare    Cooking School weekly topics:  Adding Flavor- Sodium-Free  Fast and Healthy Breakfasts  Powerhouse Plant-Based Proteins  Satisfying Salads and Dressings  Simple Sides and Sauces  International Cuisine-Spotlight on the [employer] Zones  Delicious Desserts  Savory Soups  [employer] - Meals in a Astronomer Appetizers and Snacks  Comforting Weekend Breakfasts  One-Pot Wonders   Fast Evening Meals  Landscape architect Your Pritikin Plate  WORKSHOPS   Healthy Mindset (Psychosocial):  Focused Goals, Sustainable Changes Clinical staff led group instruction and group discussion with PowerPoint presentation and patient guidebook. To enhance the learning environment the use of posters, models and videos may be added. Patients will be able to apply effective goal setting strategies to establish at least one personal goal, and then take consistent, meaningful action toward that goal. They will learn to identify common barriers to achieving personal goals and develop strategies to overcome them. Patients will also gain an understanding of how our mind-set can impact our ability to achieve goals and the importance of cultivating a positive and growth-oriented mind-set. The purpose of this lesson is to provide patients with a deeper understanding of how to set and achieve personal goals, as well as the tools and strategies needed to overcome common obstacles which may arise along the way.  From Head to Heart: The Power of a Healthy Outlook  Clinical staff led group instruction and group discussion with PowerPoint presentation and patient guidebook. To  enhance the learning environment the use of posters, models and videos may be added. Patients will be able to recognize and describe the impact of emotions and mood on physical health. They will discover the importance of self-care and explore self-care practices which may work for them. Patients will also learn how to utilize the 4 C's to cultivate a healthier outlook and better manage stress and challenges. The purpose of this lesson is to demonstrate to patients how a healthy outlook is an essential part of maintaining good health, especially as they continue their cardiac rehab journey.  Healthy Sleep for a Healthy Heart Clinical staff led group instruction and group discussion  with PowerPoint presentation and patient guidebook. To enhance the learning environment the use of posters, models and videos may be added. At the conclusion of this workshop, patients will be able to demonstrate knowledge of the importance of sleep to overall health, well-being, and quality of life. They will understand the symptoms of, and treatments for, common sleep disorders. Patients will also be able to identify daytime and nighttime behaviors which impact sleep, and they will be able to apply these tools to help manage sleep-related challenges. The purpose of this lesson is to provide patients with a general overview of sleep and outline the importance of quality sleep. Patients will learn about a few of the most common sleep disorders. Patients will also be introduced to the concept of "sleep hygiene," and discover ways to self-manage certain sleeping problems through simple daily behavior changes. Finally, the workshop will motivate patients by clarifying the links between quality sleep and their goals of heart-healthy living.   Recognizing and Reducing Stress Clinical staff led group instruction and group discussion with PowerPoint presentation and patient guidebook. To enhance the learning environment the use of posters,  models and videos may be added. At the conclusion of this workshop, patients will be able to understand the types of stress reactions, differentiate between acute and chronic stress, and recognize the impact that chronic stress has on their health. They will also be able to apply different coping mechanisms, such as reframing negative self-talk. Patients will have the opportunity to practice a variety of stress management techniques, such as deep abdominal breathing, progressive muscle relaxation, and/or guided imagery.  The purpose of this lesson is to educate patients on the role of stress in their lives and to provide healthy techniques for coping with it.  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 Jannessa says she becomes lightheaded due to issues with allergies.   Learning Preferences Skilled Demonstration          Education Topics:  Knowledge Questionnaire Score:  Knowledge Questionnaire Score - 03/03/24 1402       Knowledge Questionnaire Score   Pre Score 19/24          Core Components/Risk Factors/Patient Goals at Admission:  Personal Goals and Risk Factors at Admission - 03/03/24 1144       Core Components/Risk Factors/Patient Goals on Admission    Weight Management Yes;Obesity;Weight Loss    Intervention Weight Management/Obesity: Establish reasonable short term and long term weight goals.;Obesity: Provide education and appropriate resources to help participant work on and attain dietary goals.    Admit Weight 218 lb 14.7 oz (99.3 kg)    Expected Outcomes Short Term: Continue to assess and modify interventions until short term weight is achieved;Long Term: Adherence to nutrition and physical activity/exercise program aimed toward attainment of established weight goal;Weight Loss: Understanding of  general recommendations for a balanced deficit meal plan, which promotes 1-2 lb weight loss per week and includes a negative energy balance of [phone] kcal/d    Hypertension Yes    Intervention Provide education on lifestyle modifcations including regular physical activity/exercise, weight management, moderate sodium restriction and increased consumption of fresh fruit, vegetables, and low fat dairy, alcohol moderation, and smoking cessation.;Monitor prescription use compliance.    Expected Outcomes Short Term: Continued assessment and intervention until BP is < 140/72mm HG in hypertensive participants. < 130/56mm HG in hypertensive  participants with diabetes, heart failure or chronic kidney disease.;Long Term: Maintenance of blood pressure at goal levels.    Lipids Yes    Intervention Provide education and support for participant on nutrition & aerobic/resistive exercise along with prescribed medications to achieve LDL 70mg , HDL >40mg .    Expected Outcomes Short Term: Participant states understanding of desired cholesterol values and is compliant with medications prescribed. Participant is following exercise prescription and nutrition guidelines.;Long Term: Cholesterol controlled with medications as prescribed, with individualized exercise RX and with personalized nutrition plan. Value goals: LDL < 70mg , HDL > 40 mg.          Core Components/Risk Factors/Patient Goals Review:   Goals and Risk Factor Review     Row Name 03/09/24 1743 03/31/24 1427 05/05/24 1426         Core Components/Risk Factors/Patient Goals Review   Personal Goals Review Weight Management/Obesity;Hypertension;Lipids Weight Management/Obesity;Hypertension;Lipids Weight Management/Obesity;Hypertension;Lipids     Review Pamelyn started cardiac rehab on 03/09/24. Leanora did fair with exercise for her fitness level Vital signs were stable. Keller is doing well with exercise  at cardiac rehab. Vital signs have been stable. Pascuala  says that participating in cardiac rehab has been helpful for her. Rosabella says that she feels stronger. Maronda continues to do  well with exercise  at cardiac rehab. Vital signs remain stable. Zariana says that participating in cardiac rehab has been helpful for her. Zyonna says that she feels stronger. Ambert will complete cardiac rehab at the begining of September.     Expected Outcomes Matrtha will continue to participat in cardiac rehab for exercise nutrition and lifestyle modificaitons Wyvonna will continue to participate in cardiac rehab for exercise nutrition and lifestyle modificaitons Wyvonna will continue to participate in cardiac rehab for exercise nutrition and lifestyle modificaitons        Core Components/Risk Factors/Patient Goals at Discharge (Final Review):   Goals and Risk Factor Review - 05/05/24 1426       Core Components/Risk Factors/Patient Goals Review   Personal Goals Review Weight Management/Obesity;Hypertension;Lipids    Review Waverley continues to do  well with exercise  at cardiac rehab. Vital signs remain stable. Sammy says that participating in cardiac rehab has been helpful for her. Folashade says that she feels stronger. Dajanee will complete cardiac rehab at the begining of September.    Expected Outcomes Wyvonna will continue to participate in cardiac rehab for exercise nutrition and lifestyle modificaitons          ITP Comments:  ITP Comments     Row Name 03/03/24 1059 03/09/24 1135 03/31/24 1423 05/05/24 1423     ITP Comments Dr Wilbert Bihari MD, Medical Director. Introduction to Pritikin Education Program/ Intensive Cardiac Rehab. Initial Orientation packet Reviewed with the patient. 30-day ITP review. Shalise completed first session of exercise well without symptoms. Oriented her to the exercise equipment and stretching routine. 30-day ITP review. Trellis has good attendance and participation with exercise at cardiac rehab 30-day ITP review. Kymari continues to  have good  participation with exercise at cardiac rehab. Telia was absent recently to visit her brother in [place] .       Comments: See ITP Comments

## 2024-05-06 ENCOUNTER — Encounter (HOSPITAL_COMMUNITY)
Admission: RE | Admit: 2024-05-06 | Discharge: 2024-05-06 | Disposition: A | Discharge status: Home / Self Care | Source: Ambulatory Visit | Attending: Internal Medicine | Admitting: Internal Medicine

## 2024-05-06 DIAGNOSIS — Z955 Presence of coronary angioplasty implant and graft: Secondary | ICD-10-CM

## 2024-05-06 DIAGNOSIS — I2111 ST elevation (STEMI) myocardial infarction involving right coronary artery: Secondary | ICD-10-CM

## 2024-05-06 DIAGNOSIS — I252 Old myocardial infarction: Secondary | ICD-10-CM | POA: Diagnosis not present

## 2024-05-06 DIAGNOSIS — Z48812 Encounter for surgical aftercare following surgery on the circulatory system: Secondary | ICD-10-CM | POA: Diagnosis not present

## 2024-05-08 ENCOUNTER — Encounter (HOSPITAL_COMMUNITY)
Admission: RE | Admit: 2024-05-08 | Discharge: 2024-05-08 | Disposition: A | Discharge status: Home / Self Care | Source: Ambulatory Visit | Attending: Internal Medicine | Admitting: Internal Medicine

## 2024-05-08 DIAGNOSIS — Z955 Presence of coronary angioplasty implant and graft: Secondary | ICD-10-CM | POA: Diagnosis not present

## 2024-05-08 DIAGNOSIS — I2111 ST elevation (STEMI) myocardial infarction involving right coronary artery: Secondary | ICD-10-CM

## 2024-05-08 DIAGNOSIS — I252 Old myocardial infarction: Secondary | ICD-10-CM | POA: Diagnosis not present

## 2024-05-08 DIAGNOSIS — Z48812 Encounter for surgical aftercare following surgery on the circulatory system: Secondary | ICD-10-CM | POA: Diagnosis not present

## 2024-05-11 ENCOUNTER — Encounter (HOSPITAL_COMMUNITY)
Admission: RE | Admit: 2024-05-11 | Discharge: 2024-05-11 | Disposition: A | Discharge status: Home / Self Care | Source: Ambulatory Visit | Attending: Internal Medicine | Admitting: Internal Medicine

## 2024-05-11 DIAGNOSIS — Z955 Presence of coronary angioplasty implant and graft: Secondary | ICD-10-CM

## 2024-05-11 DIAGNOSIS — I2111 ST elevation (STEMI) myocardial infarction involving right coronary artery: Secondary | ICD-10-CM

## 2024-05-11 DIAGNOSIS — I252 Old myocardial infarction: Secondary | ICD-10-CM | POA: Diagnosis not present

## 2024-05-11 DIAGNOSIS — Z48812 Encounter for surgical aftercare following surgery on the circulatory system: Secondary | ICD-10-CM | POA: Diagnosis not present

## 2024-05-13 ENCOUNTER — Encounter (HOSPITAL_COMMUNITY): Admission: RE | Admit: 2024-05-13 | Source: Ambulatory Visit

## 2024-05-14 ENCOUNTER — Ambulatory Visit (INDEPENDENT_AMBULATORY_CARE_PROVIDER_SITE_OTHER)

## 2024-05-14 DIAGNOSIS — J309 Allergic rhinitis, unspecified: Secondary | ICD-10-CM | POA: Diagnosis not present

## 2024-05-15 ENCOUNTER — Encounter (HOSPITAL_COMMUNITY)
Admission: RE | Admit: 2024-05-15 | Discharge: 2024-05-15 | Disposition: A | Discharge status: Home / Self Care | Source: Ambulatory Visit | Attending: Internal Medicine | Admitting: Internal Medicine

## 2024-05-15 DIAGNOSIS — I2111 ST elevation (STEMI) myocardial infarction involving right coronary artery: Secondary | ICD-10-CM | POA: Diagnosis not present

## 2024-05-15 DIAGNOSIS — Z955 Presence of coronary angioplasty implant and graft: Secondary | ICD-10-CM

## 2024-05-15 DIAGNOSIS — E7800 Pure hypercholesterolemia, unspecified: Secondary | ICD-10-CM | POA: Diagnosis not present

## 2024-05-15 DIAGNOSIS — Z48812 Encounter for surgical aftercare following surgery on the circulatory system: Secondary | ICD-10-CM | POA: Diagnosis not present

## 2024-05-15 DIAGNOSIS — I252 Old myocardial infarction: Secondary | ICD-10-CM | POA: Diagnosis not present

## 2024-05-16 ENCOUNTER — Ambulatory Visit: Payer: Self-pay | Admitting: Physician Assistant

## 2024-05-16 DIAGNOSIS — I2510 Atherosclerotic heart disease of native coronary artery without angina pectoris: Secondary | ICD-10-CM

## 2024-05-16 DIAGNOSIS — E7800 Pure hypercholesterolemia, unspecified: Secondary | ICD-10-CM

## 2024-05-16 LAB — HEPATIC FUNCTION PANEL
ALT: 25 IU/L (ref 0–32)
AST: 26 IU/L (ref 0–40)
Albumin: 4.3 g/dL (ref 3.8–4.8)
Alkaline Phosphatase: 89 IU/L (ref 44–121)
Bilirubin Total: 0.3 mg/dL (ref 0.0–1.2)
Bilirubin, Direct: 0.13 mg/dL (ref 0.00–0.40)
Total Protein: 7.4 g/dL (ref 6.0–8.5)

## 2024-05-16 LAB — LIPID PANEL
Chol/HDL Ratio: 3 ratio (ref 0.0–4.4)
Cholesterol, Total: 168 mg/dL (ref 100–199)
HDL: 56 mg/dL (ref >39)
LDL Chol Calc (NIH): 100 mg/dL — ABNORMAL HIGH (ref 0–99)
Triglycerides: 60 mg/dL (ref 0–149)
VLDL Cholesterol Cal: 12 mg/dL (ref 5–40)

## 2024-05-16 MED ORDER — ROSUVASTATIN CALCIUM 40 MG PO TABS: 40.0000 mg | ORAL_TABLET | Freq: Every day | ORAL | 3 refills | Status: AC

## 2024-05-18 ENCOUNTER — Encounter (HOSPITAL_COMMUNITY): Admission: RE | Admit: 2024-05-18 | Source: Ambulatory Visit

## 2024-05-18 ENCOUNTER — Other Ambulatory Visit: Payer: Self-pay | Admitting: *Deleted

## 2024-05-18 DIAGNOSIS — E7800 Pure hypercholesterolemia, unspecified: Secondary | ICD-10-CM

## 2024-05-18 DIAGNOSIS — I2510 Atherosclerotic heart disease of native coronary artery without angina pectoris: Secondary | ICD-10-CM

## 2024-05-19 NOTE — Progress Notes (Unsigned)
 OFFICE NOTE:    Date:  05/20/2024  ID:  Anne Barnes, DOB 23-May-1946, MRN [id number] PCP: Domenica Harlene DELENA, MD  [facility] Providers Cardiologist:  Stanly DELENA Leavens, MD Cardiology APP:  Lelon Hamilton T, PA-C        Coronary artery disease Myoview  03/26/2017: No ischemia CCTA 10/31/2019: CAC score 127; nonobs CAD Inferior STEMI 01/2024 s/p 3.5 x 20 mm DES to the proximal RCA LHC 01/30/2024: LM, LAD, LCx normal; proximal RCA 99 (HFpEF) heart failure with preserved ejection fraction TTE 03/31/2021: EF 60-65, no RWMA, normal RVSF, trivial MR  TTE 11/05/2022: EF 55-60, no RWMA, mild concentric LVH, GR 2 DD, normal RVSF, moderate LAE, trivial MR, mild AV calcification, RAP 3 TTE 01/31/2024: EF 60-65, no RWMA, mild LVH, normal RVSF, trivial MR, AV sclerosis Bradycardia 2/2 beta-blocker  Cardiac monitor 12/2019: Sinus bradycardia, average heart rate 57, short episodes of SVT Hypertension Hyperlipidemia OSA intol to CPAP Asthma IBS   GERD Obesity         Discussed the use of AI scribe software for clinical note transcription with the patient, who gave verbal consent to proceed. History of Present Illness Anne Barnes is a 78 y.o. female who returns for follow up of CAD, CHF. She was last seen in 01/2024 after she had an inferior STEMI tx with DES to the [facility]   She reports no chest pain, heaviness, or pressure, and no unusual shortness of breath. She is actively participating in cardiac rehabilitation and reports significant improvement in her physical capabilities, such as being able to go up and down stairs comfortably and perform household chores without difficulty. She experiences occasional swelling in her right foot and dizziness attributed to allergies, which also cause mucus drainage and congestion.    ROS-See HPI    Studies Reviewed:       Results LABS Total cholesterol: 168 (05/15/2024) HDL: 56 (05/15/2024) LDL: 899 (05/15/2024) Triglycerides: 60  (05/15/2024) Hemoglobin: 13.3 (04/14/2024) Potassium: 4.6 (05/15/2024)           Physical Exam:  VS:  BP 136/74   Pulse 75   Ht 4' 11 (1.499 m)   Wt 218 lb (98.9 kg)   SpO2 98%   BMI 44.03 kg/m        Wt Readings from Last 3 Encounters:  05/20/24 218 lb (98.9 kg)  04/14/24 220 lb 3.2 oz (99.9 kg)  04/07/24 218 lb (98.9 kg)    Constitutional:      Appearance: Healthy appearance. Not in distress.  Pulmonary:     Breath sounds: Normal breath sounds. No wheezing. No rales.  Cardiovascular:     Normal rate. Regular rhythm.     Murmurs: There is no murmur.  Edema:    Peripheral edema absent.       Assessment and Plan:    Assessment & Plan Coronary artery disease involving native coronary artery of native heart without angina pectoris S/p Inf STEMI in 01/2024 tx with DES to RCA. She will DAPT x 1 year post ACS. She has been attending cardiac rehabilitation. She is doing well w/o anginal symptoms.  -Continue ASA 81 mg once daily, Clopidogrel  75 mg once daily, NTG prn, Crestor  40 mg once daily, valsartan  40 mg daily.  -Follow up in April-May 2026. She should be able to stop Clopidogrel  after that visit. Chronic heart failure with preserved ejection fraction (HCC) EF 60-65 by most recent echocardiogram.  Volume status stable. She is NYHA II.   Continue Jardiance   10 daily, Lasix  20 mg Monday Wednesday Friday, spironolactone  25 mg daily Essential hypertension BP is controlled.  - Continue amlodipine  2.5 mg daily, spironolactone  25 mg daily, valsartan  40 mg daily Pure hypercholesterolemia Recent LDL 100. Goal is at least < 70, ideally < 55. Rosuvastatin  dose was increased to 40 mg. -Repeat labs pending next month. If LDL not at goal consider Ezetimibe vs [id number]. -Continue Crestor  40 mg daily         Dispo:  Return in about 8 months (around 01/18/2025) for Routine Follow Up, w/ Glendia Ferrier, PA-C.  Signed, Glendia Ferrier, PA-C

## 2024-05-19 NOTE — Assessment & Plan Note (Signed)
 Recent LDL 100. Goal is at least < 70, ideally < 55. Rosuvastatin  dose was increased to 40 mg. -Repeat labs pending next month. If LDL not at goal consider Ezetimibe vs [id number]. -Continue Crestor  40 mg daily

## 2024-05-19 NOTE — Assessment & Plan Note (Signed)
 EF 60-65 by most recent echocardiogram.  Volume status stable. She is NYHA II.   Continue Jardiance  10 daily, Lasix  20 mg Monday Wednesday Friday, spironolactone  25 mg daily

## 2024-05-19 NOTE — Assessment & Plan Note (Signed)
 S/p Inf STEMI in 01/2024 tx with DES to RCA. She will DAPT x 1 year post ACS. She has been attending cardiac rehabilitation.***

## 2024-05-20 ENCOUNTER — Encounter (HOSPITAL_COMMUNITY)
Admission: RE | Admit: 2024-05-20 | Discharge: 2024-05-20 | Disposition: A | Discharge status: Home / Self Care | Source: Ambulatory Visit | Attending: Internal Medicine | Admitting: Internal Medicine

## 2024-05-20 ENCOUNTER — Ambulatory Visit: Attending: Cardiology | Admitting: Physician Assistant

## 2024-05-20 ENCOUNTER — Encounter: Payer: Self-pay | Admitting: Physician Assistant

## 2024-05-20 VITALS — BP 136/74 | HR 75 | Ht 59.0 in | Wt 218.0 lb

## 2024-05-20 DIAGNOSIS — I10 Essential (primary) hypertension: Secondary | ICD-10-CM | POA: Diagnosis not present

## 2024-05-20 DIAGNOSIS — I5032 Chronic diastolic (congestive) heart failure: Secondary | ICD-10-CM | POA: Diagnosis not present

## 2024-05-20 DIAGNOSIS — E7800 Pure hypercholesterolemia, unspecified: Secondary | ICD-10-CM | POA: Diagnosis not present

## 2024-05-20 DIAGNOSIS — I2111 ST elevation (STEMI) myocardial infarction involving right coronary artery: Secondary | ICD-10-CM

## 2024-05-20 DIAGNOSIS — I252 Old myocardial infarction: Secondary | ICD-10-CM | POA: Diagnosis not present

## 2024-05-20 DIAGNOSIS — Z955 Presence of coronary angioplasty implant and graft: Secondary | ICD-10-CM

## 2024-05-20 DIAGNOSIS — Z48812 Encounter for surgical aftercare following surgery on the circulatory system: Secondary | ICD-10-CM | POA: Diagnosis not present

## 2024-05-20 DIAGNOSIS — I2510 Atherosclerotic heart disease of native coronary artery without angina pectoris: Secondary | ICD-10-CM | POA: Diagnosis not present

## 2024-05-20 NOTE — Assessment & Plan Note (Signed)
 BP is controlled.  - Continue amlodipine  2.5 mg daily, spironolactone  25 mg daily, valsartan  40 mg daily

## 2024-05-20 NOTE — Patient Instructions (Signed)
 Medication Instructions:  Your physician recommends that you continue on your current medications as directed. Please refer to the Current Medication list given to you today.  *If you need a refill on your cardiac medications before your next appointment, please call your pharmacy*  Lab Work: None ordered  If you have labs (blood work) drawn today and your tests are completely normal, you will receive your results only by: MyChart Message (if you have MyChart) OR A paper copy in the mail If you have any lab test that is abnormal or we need to change your treatment, we will call you to review the results.  Testing/Procedures: None ordered  Follow-Up: At [facility], you and your health needs are our priority.  As part of our continuing mission to provide you with exceptional heart care, our providers are all part of one team.  This team includes your primary Cardiologist (physician) and Advanced Practice Providers or APPs (Physician Assistants and Nurse Practitioners) who all work together to provide you with the care you need, when you need it.  Your next appointment:   8 month(s)  Provider:   Glendia Ferrier, PA-C          We recommend signing up for the patient portal called MyChart.  Sign up information is provided on this After Visit Summary.  MyChart is used to connect with patients for Virtual Visits (Telemedicine).  Patients are able to view lab/test results, encounter notes, upcoming appointments, etc.  Non-urgent messages can be sent to your provider as well.   To learn more about what you can do with MyChart, go to [email or web address].   Other Instructions

## 2024-05-21 ENCOUNTER — Ambulatory Visit (INDEPENDENT_AMBULATORY_CARE_PROVIDER_SITE_OTHER)

## 2024-05-21 DIAGNOSIS — J309 Allergic rhinitis, unspecified: Secondary | ICD-10-CM | POA: Diagnosis not present

## 2024-05-22 ENCOUNTER — Encounter (HOSPITAL_COMMUNITY)
Admission: RE | Admit: 2024-05-22 | Discharge: 2024-05-22 | Disposition: A | Discharge status: Home / Self Care | Source: Ambulatory Visit | Attending: Internal Medicine | Admitting: Internal Medicine

## 2024-05-22 DIAGNOSIS — I252 Old myocardial infarction: Secondary | ICD-10-CM | POA: Diagnosis not present

## 2024-05-22 DIAGNOSIS — Z955 Presence of coronary angioplasty implant and graft: Secondary | ICD-10-CM

## 2024-05-22 DIAGNOSIS — I2111 ST elevation (STEMI) myocardial infarction involving right coronary artery: Secondary | ICD-10-CM

## 2024-05-22 DIAGNOSIS — Z48812 Encounter for surgical aftercare following surgery on the circulatory system: Secondary | ICD-10-CM | POA: Diagnosis not present

## 2024-05-27 ENCOUNTER — Encounter (HOSPITAL_COMMUNITY)
Admission: RE | Admit: 2024-05-27 | Discharge: 2024-05-27 | Disposition: A | Discharge status: Home / Self Care | Source: Ambulatory Visit | Attending: Internal Medicine | Admitting: Internal Medicine

## 2024-05-27 DIAGNOSIS — Z955 Presence of coronary angioplasty implant and graft: Secondary | ICD-10-CM | POA: Insufficient documentation

## 2024-05-27 DIAGNOSIS — I2111 ST elevation (STEMI) myocardial infarction involving right coronary artery: Secondary | ICD-10-CM | POA: Insufficient documentation

## 2024-05-29 ENCOUNTER — Encounter (HOSPITAL_COMMUNITY)
Admission: RE | Admit: 2024-05-29 | Discharge: 2024-05-29 | Disposition: A | Discharge status: Home / Self Care | Source: Ambulatory Visit | Attending: Internal Medicine | Admitting: Internal Medicine

## 2024-05-29 DIAGNOSIS — Z955 Presence of coronary angioplasty implant and graft: Secondary | ICD-10-CM | POA: Diagnosis not present

## 2024-05-29 DIAGNOSIS — I2111 ST elevation (STEMI) myocardial infarction involving right coronary artery: Secondary | ICD-10-CM | POA: Diagnosis not present

## 2024-06-01 ENCOUNTER — Encounter (HOSPITAL_COMMUNITY)
Admission: RE | Admit: 2024-06-01 | Discharge: 2024-06-01 | Disposition: A | Discharge status: Home / Self Care | Source: Ambulatory Visit | Attending: Internal Medicine

## 2024-06-01 DIAGNOSIS — Z955 Presence of coronary angioplasty implant and graft: Secondary | ICD-10-CM

## 2024-06-01 DIAGNOSIS — I2111 ST elevation (STEMI) myocardial infarction involving right coronary artery: Secondary | ICD-10-CM | POA: Diagnosis not present

## 2024-06-01 NOTE — Progress Notes (Signed)
 Cardiac Individual Treatment Plan  Patient Details  Name: Anne Barnes MRN: [id number] Date of Birth: 03-Mar-1946 Referring Provider:   Flowsheet Row INTENSIVE CARDIAC REHAB ORIENT from 03/03/2024 in [facility]  Referring Provider Anne Stanly DELENA, Anne Barnes    Initial Encounter Date:  Flowsheet Row INTENSIVE CARDIAC REHAB ORIENT from 03/03/2024 in [facility]  Date 03/03/24    Visit Diagnosis: 01/30/24 STEMI  01/30/24 S/P PCI/DES RCA  Patient's Home Medications on Admission:  Current Outpatient Medications:    albuterol  (VENTOLIN  HFA) 108 (90 Base) MCG/ACT inhaler, INHALE 2 PUFFS BY MOUTH 2 puffs every 4-6 hours as needed for cough/wheeze/shortness of breath/chest, Disp: 18 g, Rfl: 1   alendronate  (FOSAMAX ) 70 MG tablet, Take 1 tablet (70 mg total) by mouth once a week. Take with full glass of water on an empty stomach, Disp: 12 tablet, Rfl: 3   ALPRAZolam  (XANAX ) 0.5 MG tablet, TAKE 1 TABLET BY MOUTH TWICE DAILY AS NEEDED FOR ANXIETY AND FOR SLEEP, Disp: 20 tablet, Rfl: 0   amLODipine  (NORVASC ) 2.5 MG tablet, Take 1 tablet (2.5 mg total) by mouth daily., Disp: 30 tablet, Rfl: 3   aspirin  EC 81 MG tablet, Take 1 tablet (81 mg total) by mouth daily. Swallow whole., Disp: 30 tablet, Rfl: 3   azelastine  (ASTELIN ) 0.1 % nasal spray, Place 2 sprays into both nostrils 2 (two) times daily as needed for rhinitis., Disp: 30 mL, Rfl: 5   azelastine  (OPTIVAR ) 0.05 % ophthalmic solution, Place 1 drop into both eyes 2 (two) times daily as needed., Disp: 6 mL, Rfl: 5   b complex vitamins tablet, Take 1 tablet by mouth daily., Disp: , Rfl:    Cholecalciferol (VITAMIN D ) 50 MCG (2000 UT) tablet, Take 2,000 Units by mouth daily., Disp: , Rfl:    clopidogrel  (PLAVIX ) 75 MG tablet, Take 1 tablet (75 mg total) by mouth daily with breakfast., Disp: 90 tablet, Rfl: 3   empagliflozin   (JARDIANCE ) 10 MG TABS tablet, Take 1 tablet (10 mg total) by mouth daily before breakfast., Disp: 90 tablet, Rfl: 3   EPINEPHrine  0.3 mg/0.3 mL IJ SOAJ injection, Inject 0.3 mg into the muscle as needed for anaphylaxis., Disp: 2 each, Rfl: 2   fluticasone  (FLONASE ) 50 MCG/ACT nasal spray, Use 1-2 sprays in each nostril once  a day as needed for stuffy nose. In the right nostril, point the applicator out toward the right ear. In the left nostril, point the applicator out toward the left ear, Disp: 16 g, Rfl: 5   furosemide  (LASIX ) 20 MG tablet, Take 1 tablet (20 mg total) by mouth 3 (three) times a week. Monday, Wednesday and Friday., Disp: 90 tablet, Rfl: 3   levocetirizine (XYZAL ) 5 MG tablet, Take 1 tablet (5 mg total) by mouth every evening., Disp: 30 tablet, Rfl: 5   MAGNESIUM  CITRATE PO, Take 1 tablet by mouth daily., Disp: , Rfl:    montelukast  (SINGULAIR ) 10 MG tablet, Take 1 tablet (10 mg total) by mouth at bedtime., Disp: 30 tablet, Rfl: 5   Multiple Vitamin (MULTIVITAMIN) tablet, Take 1 tablet by mouth daily., Disp: , Rfl:    nitroGLYCERIN  (NITROSTAT ) 0.4 MG SL tablet, Place 1 tablet (0.4 mg total) under the tongue every 5 (five) minutes as needed for chest pain., Disp: 25 tablet, Rfl: 1   pantoprazole  (PROTONIX ) 40 MG tablet, Take 1 tablet (40 mg total) by mouth daily., Disp:  90 tablet, Rfl: 1   Pseudoephedrine-Ibuprofen (ADVIL COLD/SINUS PO), Take 2 tablets by mouth as needed., Disp: , Rfl:    rosuvastatin  (CRESTOR ) 40 MG tablet, Take 1 tablet (40 mg total) by mouth daily., Disp: 90 tablet, Rfl: 3   spironolactone  (ALDACTONE ) 25 MG tablet, Take 1 tablet (25 mg total) by mouth daily., Disp: 90 tablet, Rfl: 2   UNABLE TO FIND, Take 2 capsules by mouth daily. Fish Oil w/ tumeric, Disp: , Rfl:    valsartan  (DIOVAN ) 40 MG tablet, Take 1 tablet (40 mg total) by mouth daily., Disp: 90 tablet, Rfl: 3  Past Medical History: Past Medical History:  Diagnosis Date   Acute combined systolic and  diastolic heart failure (HCC) 05/26/2015   ALLERGIC RHINITIS    Allergic urticaria 08/01/2014   Patient reports allergies to shrimp, egg whites, tomatoes, dairy    Allergy     seasonal and numerous food and drug allergies.   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   Office Spirometry 11/07/17-WNL-FVC 1.58/82%, FEV1 1.27/85%, ratio 0.80, FEF 25-75% 0.26/89%   Bradycardia 02/25/2017   CAD (coronary artery disease) 05/01/2023   Myoview  03/26/2017: No ischemia Non-obs dz - CCTA 10/31/2019: CAC score 127; RCA proximal and mid 25-49, LAD proximal and mid 0-25    Chiari malformation    Noted MRI brain 09/2013 - s/p neuro eval for same   DIVERTICULOSIS, COLON    Essential hypertension 08/28/2010   Qualifier: Diagnosis of  By: Wilhemina RMA, Lucy      Frequent headaches    GERD    H/O measles    H/O mumps    Hearing loss 01/18/2017   History of chicken pox    Hyperglycemia 03/05/2016   Hyperlipidemia    HYPERTENSION    Hypocalcemia 02/25/2017   IBS (irritable bowel syndrome) 05/26/2015   Nonspecific abnormal electrocardiogram (ECG) (EKG)    OBSTRUCTIVE SLEEP APNEA 12/2008 dx   noncompliant with CPAP qhs   Obstructive sleep apnea 08/28/2010   NPSG 12/2008:  AHI 13/hr with desats to 78%    Preventative health care 06/11/2017   Sciatica    right side   Seasonal and perennial allergic rhinitis 08/28/2010   Allergy  profile 03/11/14- positive especially for dust, cat and dog Food allergy  profile- total IgE 86 with several food group elevations Sed rate-WNL      Sinusitis 06/06/2017   Sleep apnea    uses cpap   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    Labs: Review Flowsheet  More data exists      Latest Ref Rng & Units 07/25/2023 01/23/2024 01/30/2024 01/31/2024 05/15/2024  Labs for ITP Cardiac and Pulmonary Rehab   Cholestrol 100 - 199 mg/dL 873  840  - 877  831   LDL (calc) 0 - 99 mg/dL 75  898  - 72  899   HDL-C >39 mg/dL 59.49  52.69  - 42  56   Trlycerides 0 - 149 mg/dL 51.9  48.9  - 40  60   Hemoglobin A1c 4.8 - 5.6 % 5.4  5.6  - 5.1  -  TCO2 22 - 32 mmol/L - - 27  - -    Capillary Blood Glucose: Lab Results  Component Value Date   GLUCAP 117 (H) 03/03/2024   GLUCAP 136 (H) 01/31/2024  GLUCAP 98 01/31/2024   GLUCAP 111 (H) 01/31/2024   GLUCAP 96 01/30/2024     Exercise Target Goals: Exercise Program Goal: Individual exercise prescription set using results from initial 6 min walk test and THRR while considering  patient's activity barriers and safety.   Exercise Prescription Goal: Initial exercise prescription builds to 30-45 minutes a day of aerobic activity, 2-3 days per week.  Home exercise guidelines will be given to patient during program as part of exercise prescription that the participant will acknowledge.  Activity Barriers & Risk Stratification:  Activity Barriers & Cardiac Risk Stratification - 03/03/24 1144       Activity Barriers & Cardiac Risk Stratification   Activity Barriers Balance Concerns;History of Falls;Assistive Device;Other (comment)    Comments Possible neuropathy in legs/ feet.    Cardiac Risk Stratification High          6 Minute Walk:  6 Minute Walk     Row Name 03/03/24 1200 05/29/24 1049       6 Minute Walk   Phase Initial Discharge    Distance 720 feet 953 feet    Distance % Change -- 32.36 %    Distance Feet Change -- 233 ft    Walk Time 6 minutes 6 minutes    # of Rest Breaks 0 0    MPH 1.36 1.8    METS 0.91 1.66    RPE 13 11    Perceived Dyspnea  2 0    VO2 Peak 3.18 5.82    Symptoms Yes (comment) Yes (comment)    Comments Mild shortness of breath. Left leg and foot weakness. Left knee pain, chronic, 4/10 on the pain scale.    Resting HR 75 bpm 97 bpm    Resting BP 134/78 132/72    Resting Oxygen Saturation  97 % --     Exercise Oxygen Saturation  during 6 min walk 96 % --    Max Ex. HR 102 bpm 122 bpm    Max Ex. BP 148/68 162/52    2 Minute Post BP 150/72 --       Oxygen Initial Assessment:   Oxygen Re-Evaluation:   Oxygen Discharge (Final Oxygen Re-Evaluation):   Initial Exercise Prescription:  Initial Exercise Prescription - 03/03/24 1300       Date of Initial Exercise RX and Referring Provider   Date 03/03/24    Referring Provider Anne Stanly LABOR, Anne Barnes    Expected Discharge Date 05/27/24      NuStep   Level 1    SPM 75    Minutes 15    METs 1.5      Prescription Details   Frequency (times per week) 3    Duration Progress to 30 minutes of continuous aerobic without signs/symptoms of physical distress      Intensity   THRR 40-80% of Max Heartrate 58-115    Ratings of Perceived Exertion 11-13    Perceived Dyspnea 0-4      Progression   Progression Continue to progress workloads to maintain intensity without signs/symptoms of physical distress.      Resistance Training   Training Prescription Yes    Weight 2 lbs    Reps 10-15          Perform Capillary Blood Glucose checks as needed.  Exercise Prescription Changes:   Exercise Prescription Changes     Row Name 03/09/24 1037 03/23/24 1033 04/06/24 1031 04/15/24 1034 05/01/24 1027     Response to Exercise  Blood Pressure (Admit) 140/78 130/70 144/60 128/60 134/70   Blood Pressure (Exercise) 156/70 130/70 -- 168/72 --   Blood Pressure (Exit) 124/70 122/62 124/70 126/82 120/66   Heart Rate (Admit) 75 bpm 77 bpm 69 bpm 93 bpm 100 bpm   Heart Rate (Exercise) 104 bpm 109 bpm 105 bpm 105 bpm 108 bpm   Heart Rate (Exit) 84 bpm 86 bpm 78 bpm 94 bpm 87 bpm   Rating of Perceived Exertion (Exercise) 9 11 8 11 9    Symptoms None None None None None   Comments Off to a good start with exercise. -- -- -- Reviewed METs and goals with Anne Barnes. Increased NuStep from level 2 to 3 the last 3.5 minutes of the station.   Duration  Continue with 30 min of aerobic exercise without signs/symptoms of physical distress. Continue with 30 min of aerobic exercise without signs/symptoms of physical distress. Continue with 30 min of aerobic exercise without signs/symptoms of physical distress. Continue with 30 min of aerobic exercise without signs/symptoms of physical distress. Continue with 30 min of aerobic exercise without signs/symptoms of physical distress.   Intensity THRR unchanged THRR unchanged THRR unchanged THRR unchanged THRR unchanged     Progression   Progression Continue to progress workloads to maintain intensity without signs/symptoms of physical distress. Continue to progress workloads to maintain intensity without signs/symptoms of physical distress. Continue to progress workloads to maintain intensity without signs/symptoms of physical distress. Continue to progress workloads to maintain intensity without signs/symptoms of physical distress. Continue to progress workloads to maintain intensity without signs/symptoms of physical distress.   Average METs 1.7 2.1 2 2  2.1     Resistance Training   Training Prescription Yes Yes Yes No Yes   Weight 2 lbs 2 lbs 2 lbs Relaxation day, no weights. 2 lbs   Reps 10-15 10-15 10-15 -- 10-15   Time 5 Minutes 5 Minutes 5 Minutes -- 5 Minutes     Interval Training   Interval Training No No No No No     NuStep   Level 2 2 2 2 2    SPM 78 115 117 113 119   Minutes 30 30 30 30 30    METs 1.7 2.1 2 2  2.1     Home Exercise Plan   Plans to continue exercise at -- -- Home (comment)  Walking Home (comment)  Walking Home (comment)  Walking   Frequency -- -- Add 3 additional days to program exercise sessions. Add 3 additional days to program exercise sessions. Add 3 additional days to program exercise sessions.   Initial Home Exercises Provided -- -- 03/25/24 03/25/24 03/25/24    Row Name 05/11/24 1034 05/27/24 1030           Response to Exercise   Blood Pressure (Admit) 124/74  144/68      Blood Pressure (Exit) 146/70 128/60      Heart Rate (Admit) 98 bpm 88 bpm      Heart Rate (Exercise) 122 bpm 116 bpm      Heart Rate (Exit) 102 bpm 92 bpm      Rating of Perceived Exertion (Exercise) 9 9      Symptoms None None      Comments Increased workload on the recumbent stepper. --      Duration Continue with 30 min of aerobic exercise without signs/symptoms of physical distress. Continue with 30 min of aerobic exercise without signs/symptoms of physical distress.      Intensity THRR unchanged THRR unchanged  Progression   Progression Continue to progress workloads to maintain intensity without signs/symptoms of physical distress. Continue to progress workloads to maintain intensity without signs/symptoms of physical distress.      Average METs 2.2 2.1        Resistance Training   Training Prescription Yes No      Weight 2 lbs Relaxation day, no weights.      Reps 10-15 --      Time 5 Minutes --        Interval Training   Interval Training No No        NuStep   Level 4 4      SPM 128 158      Minutes 30 30      METs 2.1 2.1        Home Exercise Plan   Plans to continue exercise at Home (comment)  Walking Home (comment)  Walking      Frequency Add 3 additional days to program exercise sessions. Add 3 additional days to program exercise sessions.      Initial Home Exercises Provided 03/25/24 03/25/24         Exercise Comments:   Exercise Comments     Row Name 03/09/24 1135 03/25/24 1103 05/01/24 1103 05/20/24 1143     Exercise Comments Anne Barnes tolerated low intensity exercise well without symptoms. Oriented her to the exercise equipment and stretching routine. Reviewed home exercise guidelines, MET, and goals with Anne Barnes. Reviewed MET and goals with Anne Barnes. Reviewed goals with Anne Barnes.       Exercise Goals and Review:   Exercise Goals     Row Name 03/03/24 1144             Exercise Goals   Increase Physical Activity Yes        Intervention Provide advice, education, support and counseling about physical activity/exercise needs.;Develop an individualized exercise prescription for aerobic and resistive training based on initial evaluation findings, risk stratification, comorbidities and participant's personal goals.       Expected Outcomes Short Term: Attend rehab on a regular basis to increase amount of physical activity.;Long Term: Exercising regularly at least 3-5 days a week.;Long Term: Add in home exercise to make exercise part of routine and to increase amount of physical activity.       Increase Strength and Stamina Yes       Intervention Provide advice, education, support and counseling about physical activity/exercise needs.;Develop an individualized exercise prescription for aerobic and resistive training based on initial evaluation findings, risk stratification, comorbidities and participant's personal goals.       Expected Outcomes Short Term: Increase workloads from initial exercise prescription for resistance, speed, and METs.;Short Term: Perform resistance training exercises routinely during rehab and add in resistance training at home;Long Term: Improve cardiorespiratory fitness, muscular endurance and strength as measured by increased METs and functional capacity ( )       Able to understand and use rate of perceived exertion (RPE) scale Yes       Intervention Provide education and explanation on how to use RPE scale       Expected Outcomes Short Term: Able to use RPE daily in rehab to express subjective intensity level;Long Term:  Able to use RPE to guide intensity level when exercising independently       Knowledge and understanding of Target Heart Rate Range (THRR) Yes       Intervention Provide education and explanation of THRR including how the numbers were predicted and where  they are located for reference       Expected Outcomes Short Term: Able to state/look up THRR;Long Term: Able to use THRR to govern  intensity when exercising independently;Short Term: Able to use daily as guideline for intensity in rehab       Able to check pulse independently Yes       Intervention Provide education and demonstration on how to check pulse in carotid and radial arteries.;Review the importance of being able to check your own pulse for safety during independent exercise       Expected Outcomes Short Term: Able to explain why pulse checking is important during independent exercise;Long Term: Able to check pulse independently and accurately       Understanding of Exercise Prescription Yes       Intervention Provide education, explanation, and written materials on patient's individual exercise prescription       Expected Outcomes Short Term: Able to explain program exercise prescription;Long Term: Able to explain home exercise prescription to exercise independently          Exercise Goals Re-Evaluation :  Exercise Goals Re-Evaluation     Row Name 03/09/24 1135 03/25/24 1103 04/15/24 1133 05/01/24 1103 05/20/24 1143     Exercise Goal Re-Evaluation   Exercise Goals Review Increase Physical Activity;Increase Strength and Stamina;Able to understand and use rate of perceived exertion (RPE) scale Increase Physical Activity;Increase Strength and Stamina;Able to understand and use rate of perceived exertion (RPE) scale;Understanding of Exercise Prescription;Able to check pulse independently;Knowledge and understanding of Target Heart Rate Range (THRR) Increase Physical Activity;Increase Strength and Stamina;Able to understand and use rate of perceived exertion (RPE) scale;Understanding of Exercise Prescription;Able to check pulse independently;Knowledge and understanding of Target Heart Rate Range (THRR) Increase Physical Activity;Increase Strength and Stamina;Able to understand and use rate of perceived exertion (RPE) scale;Understanding of Exercise Prescription;Able to check pulse independently;Knowledge and understanding  of Target Heart Rate Range (THRR) Increase Physical Activity;Increase Strength and Stamina;Able to understand and use rate of perceived exertion (RPE) scale;Understanding of Exercise Prescription;Able to check pulse independently;Knowledge and understanding of Target Heart Rate Range (THRR)   Comments Anne Barnes was able to understand and use RPE scale appropriately. Reviewed exercise prescription with Anne Barnes. She is walking in the park using her rollator and stretching. She walks 30 minutes, 3 days/week. She has a treadmill at home that she uses when it's too cold to walk outside. Anne Barnes is making gradual progess with exercise. Anne Barnes has been having intermittent pain and weakness in her left leg. She's been evaluated for neuropathy, but doesn't appear to have that issue. She plans to seek a referral to an orthopedic surgeon for further evaluation. She states the NuStep and exercise at cardiac rehab doesn't not worsen her problem. She is using her seated foot bike and elliptical machine at home. She also has a treadmill but is not currently using it. She is only able to tolerate the elliptical for 8 minutes due to dificulty. She also has access to 1-4 lb weights. She is exercising at home Tues, Thurs, and one day during the weekend in addition to exercise at cardiac rehab. Estefani feels she's benefitting from participation in the intensive cardiac rehab program. She was able to visit her granddaughter's house for the first time in a year. She was previously unable to visit because she was unable take the stairs. She was able to walk up 16 steps without stopping and without shortness of breath. She's also states she's able to do several loads of  laundry go up and down the steps to her basement where the laundry room is.   Expected Outcomes Progress workloads as tolerated to help improve cardiorespiratory fitness. Anne Barnes will continue walking at home in addition to exercise at cardiac rehab. Progress workloads as  tolerated. Laniesha will continue exercise 6 days/week to help increase strength especially leg strength. Continue to progress workloads to help maintain health and fitness gains.    Row Name 05/29/24 1100             Exercise Goal Re-Evaluation   Exercise Goals Review Increase Physical Activity;Increase Strength and Stamina;Able to understand and use rate of perceived exertion (RPE) scale;Understanding of Exercise Prescription;Able to check pulse independently;Knowledge and understanding of Target Heart Rate Range (THRR)       Comments Anne Barnes's functional capacity increased 32% as measired by the 6-minute walk test, strength increased 37% as measured by the grip strength test.       Expected Outcomes Continue to progress workloads to help maintain health and fitness gains.          Discharge Exercise Prescription (Final Exercise Prescription Changes):  Exercise Prescription Changes - 05/27/24 1030       Response to Exercise   Blood Pressure (Admit) 144/68    Blood Pressure (Exit) 128/60    Heart Rate (Admit) 88 bpm    Heart Rate (Exercise) 116 bpm    Heart Rate (Exit) 92 bpm    Rating of Perceived Exertion (Exercise) 9    Symptoms None    Duration Continue with 30 min of aerobic exercise without signs/symptoms of physical distress.    Intensity THRR unchanged      Progression   Progression Continue to progress workloads to maintain intensity without signs/symptoms of physical distress.    Average METs 2.1      Resistance Training   Training Prescription No    Weight Relaxation day, no weights.      Interval Training   Interval Training No      NuStep   Level 4    SPM 158    Minutes 30    METs 2.1      Home Exercise Plan   Plans to continue exercise at Home (comment)   Walking   Frequency Add 3 additional days to program exercise sessions.    Initial Home Exercises Provided 03/25/24          Nutrition:  Target Goals: Understanding of nutrition guidelines, daily  intake of sodium 1500mg , cholesterol 200mg , calories 30% from fat and 7% or less from saturated fats, daily to have 5 or more servings of fruits and vegetables.  Biometrics:  Pre Biometrics - 03/03/24 1058       Pre Biometrics   Waist Circumference 51.25 inches    Hip Circumference 53.25 inches    Waist to Hip Ratio 0.96 %    Triceps Skinfold 52 mm    % Body Fat 57.3 %    Grip Strength 8 kg    Flexibility 0 in    Single Leg Stand 2.87 seconds          Post Biometrics - 05/29/24 1100        Post  Biometrics   Waist Circumference 50 inches    Hip Circumference 50.5 inches    Waist to Hip Ratio 0.99 %    Triceps Skinfold 42 mm    Grip Strength 11 kg    Flexibility 0 in    Single Leg Stand 3.5 seconds  Nutrition Therapy Plan and Nutrition Goals:  Nutrition Therapy & Goals - 05/04/24 1140       Nutrition Therapy   Diet Heart healthy Diet    Drug/Food Interactions Statins/Certain Fruits      Personal Nutrition Goals   Nutrition Goal Patient to identify strategies for reducing cardiovascular risk by attending the [facility] education and nutrition series weekly.   goal in action.   Personal Goal #2 Patient to improve diet quality by using the plate method as a guide for meal planning to include lean protein/plant protein, fruits, vegetables, whole grains, nonfat dairy as part of a well-balanced diet.   goal in progress.   Comments Goals in progress. Patient has medical history of STEMI, HFpEF, hyperlipidemia.She has started to regularly attend the [facility] education/nutrition series.  LDL is 72 with ideal goal <44. 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 She is up  2.2# since starting with our program. Patient will benefit from participation in intensive cardiac rehab and adherence to nutrition, exercise, and lifestyle modification.      Intervention Plan   Intervention  Prescribe, educate and counsel regarding individualized specific dietary modifications aiming towards targeted core components such as weight, hypertension, lipid management, diabetes, heart failure and other comorbidities.;Nutrition handout(s) given to patient.    Expected Outcomes Short Term Goal: Understand basic principles of dietary content, such as calories, fat, sodium, cholesterol and nutrients.;Long Term Goal: Adherence to prescribed nutrition plan.          Nutrition Assessments:  Nutrition Assessments - 03/09/24 1147       Rate Your Plate Scores   Pre Score 73         MEDIFICTS Score Key: >=70 Need to make dietary changes  40-70 Heart Healthy Diet <= 40 Therapeutic Level Cholesterol Diet   Flowsheet Row INTENSIVE CARDIAC REHAB from 03/09/2024 in [facility]  Picture Your Plate Total Score on Admission 73   Picture Your Plate Scores: <59 Unhealthy dietary pattern with much room for improvement. 41-50 Dietary pattern unlikely to meet recommendations for good health and room for improvement. 51-60 More healthful dietary pattern, with some room for improvement.  >60 Healthy dietary pattern, although there may be some specific behaviors that could be improved.    Nutrition Goals Re-Evaluation:  Nutrition Goals Re-Evaluation     Row Name 03/09/24 1103 04/06/24 1128 05/04/24 1140         Goals   Current Weight 218 lb 14.7 oz (99.3 kg) 222 lb 10.6 oz (101 kg) 221 lb 1.9 oz (100.3 kg)     Comment A1c WNL, LDL 72 (goals <70 but ideally <55), HDL 42 A1c WNL, LDL 72 (goals <70 but ideally <55), HDL 42 no new labs; most recent labs A1c WNL, LDL 72 (goals <70 but ideally <55), HDL 42     Expected Outcome Patient has medical history of STEMI, HFpEF, hyperlipidemia. LDL is 72 with ideal goal <44. 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 Patient will benefit from participation in intensive cardiac rehab for nutrition, exercise, and lifestyle modification. Patient has medical history of STEMI, HFpEF, hyperlipidemia.She does not regularly attend the [facility] education/nutrition series. LDL is 72 with ideal goal <44. 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 She is up 3.7#  since starting with our program. Patient will benefit from participation in intensive cardiac rehab for nutrition, exercise, and lifestyle modification. Goals in progress. Patient has medical history of STEMI, HFpEF, hyperlipidemia.She has started to regularly attend the [facility] education/nutrition series. LDL is 72 with ideal goal <44. 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 She is up 2.2# since starting with our program. Patient will benefit from participation in intensive cardiac rehab and adherence to nutrition, exercise, and lifestyle modification.        Nutrition Goals Re-Evaluation:  Nutrition Goals Re-Evaluation     Row Name 03/09/24 1103 04/06/24 1128 05/04/24 1140         Goals   Current Weight 218 lb 14.7 oz (99.3 kg) 222 lb 10.6 oz (101 kg) 221 lb 1.9 oz (100.3 kg)     Comment A1c WNL, LDL 72 (goals <70 but ideally <55), HDL 42 A1c WNL, LDL 72 (goals <70 but ideally <55), HDL 42 no new labs; most recent labs A1c WNL, LDL 72 (goals <70 but ideally <55), HDL 42     Expected Outcome Patient has medical history of STEMI, HFpEF, hyperlipidemia. LDL is 72 with ideal goal <44. 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 Patient will benefit from participation in intensive cardiac rehab for nutrition, exercise, and lifestyle modification. Patient has medical history of STEMI,  HFpEF, hyperlipidemia.She does not regularly attend the [facility] education/nutrition series. LDL is 72 with ideal goal <44. 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 She is up 3.7# since starting with our program. Patient will benefit from participation in intensive cardiac rehab for nutrition, exercise, and lifestyle modification. Goals in progress. Patient has medical history of STEMI, HFpEF, hyperlipidemia.She has started to regularly attend the [facility] education/nutrition series. LDL is 72 with ideal goal <44. 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 She is up 2.2# since starting with our program. Patient will benefit from participation in intensive cardiac rehab and adherence to nutrition, exercise, and lifestyle modification.        Nutrition Goals Discharge (Final Nutrition Goals Re-Evaluation):  Nutrition Goals Re-Evaluation - 05/04/24 1140       Goals   Current Weight 221 lb 1.9 oz (100.3 kg)    Comment no new labs; most recent labs A1c WNL, LDL 72 (goals <70 but ideally <55), HDL 42    Expected Outcome Goals in progress. Patient has medical history of STEMI, HFpEF, hyperlipidemia.She has started to regularly attend the [facility] education/nutrition series. LDL is 72 with ideal goal <44. 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 She is up 2.2# since starting with our program. Patient will benefit from participation in intensive cardiac rehab and adherence to nutrition, exercise, and lifestyle modification.          Psychosocial: Target Goals: Acknowledge presence or absence of significant depression and/or stress, maximize coping skills, provide positive support system. Participant is able to verbalize types and  ability to use techniques and skills needed for reducing stress and depression.  Initial Review & Psychosocial Screening:  Initial Psych Review & Screening - 03/03/24 1100       Initial Review   Current issues with History of Depression  Family Dynamics   Good Support System? Yes   Anne Barnes live with her daughter and her 2 sons   Comments Sanai lost her husband 4 years ago and her daughter 2 years ago from ALS she was 34 years old      Barriers   Psychosocial barriers to participate in program The patient should benefit from training in stress management and relaxation.      Screening Interventions   Interventions Encouraged to exercise;To provide support and resources with identified psychosocial needs;Provide feedback about the scores to participant    Expected Outcomes Short Term goal: Utilizing psychosocial counselor, staff and physician to assist with identification of specific Stressors or current issues interfering with healing process. Setting desired goal for each stressor or current issue identified.;Long Term Goal: Stressors or current issues are controlled or eliminated.;Short Term goal: Identification and review with participant of any Quality of Life or Depression concerns found by scoring the questionnaire.;Long Term goal: The participant improves quality of Life and PHQ9 Scores as seen by post scores and/or verbalization of changes          Quality of Life Scores:  Quality of Life - 03/03/24 1402       Quality of Life   Select Quality of Life      Quality of Life Scores   Health/Function Pre 25.03 %    Socioeconomic Pre 26.29 %    Psych/Spiritual Pre 28.21 %    Family Pre 30 %    GLOBAL Pre 26.58 %         Scores of 19 and below usually indicate a poorer quality of life in these areas.  A difference of  2-3 points is a clinically meaningful difference.  A difference of 2-3 points in the total score of the Quality of Life Index has been associated with  significant improvement in overall quality of life, self-image, physical symptoms, and general health in studies assessing change in quality of life.  PHQ-9: Review Flowsheet  More data exists      03/03/2024 02/05/2024 07/25/2023 03/05/2023 10/23/2022  Depression screen PHQ 2/9  Decreased Interest 0 0 0 0 0  Down, Depressed, Hopeless 0 0 0 0 0  PHQ - 2 Score 0 0 0 0 0  Altered sleeping 2 - - 0 -  Tired, decreased energy 2 - - 0 -  Change in appetite 0 - - 0 -  Feeling bad or failure about yourself  0 - - 0 -  Trouble concentrating 0 - - 0 -  Moving slowly or fidgety/restless 0 - - 0 -  Suicidal thoughts 0 - - 0 -  PHQ-9 Score 4 - - 0 -  Difficult doing work/chores Somewhat difficult - - Not difficult at all -   Interpretation of Total Score  Total Score Depression Severity:  1-4 = Minimal depression, 5-9 = Mild depression, 10-14 = Moderate depression, 15-19 = Moderately severe depression, 20-27 = Severe depression   Psychosocial Evaluation and Intervention:   Psychosocial Re-Evaluation:  Psychosocial Re-Evaluation     Row Name 03/09/24 1741 03/31/24 1424 05/05/24 1424 05/28/24 1527       Psychosocial Re-Evaluation   Current issues with History of Depression;Current Sleep Concerns History of Depression;Current Sleep Concerns History of Depression;Current Sleep Concerns History of Depression;Current Sleep Concerns    Comments Anne Barnes did not voice any increased concerns or stressors. Anne Barnes did says she has trouble sleeping at orientation and sleeps in her recliner. Anne Barnes has not  not voiced any  increased concerns or stressors during exercise at cardiac rehab. Anne Barnes says she is enjoying participating in the porgram. Anne Barnes continues not to voice any increased concerns or stressors during exercise at cardiac rehab. Anne Barnes says she is enjoying participating in the porgram. Anne Barnes recently expressed sadness about her daughter's passing. Emotional support provided. Anne Barnes has good support  from her surving children.    Expected Outcomes -- Aarika will have controlled or decreased depression upon completion of cardiac rehab Dnya will have controlled or decreased depression upon completion of cardiac rehab [place] will have controlled or decreased depression upon completion of cardiac rehab    Interventions Stress management education;Encouraged to attend Cardiac Rehabilitation for the exercise;Relaxation education Stress management education;Encouraged to attend Cardiac Rehabilitation for the exercise;Relaxation education Stress management education;Encouraged to attend Cardiac Rehabilitation for the exercise;Relaxation education Stress management education;Encouraged to attend Cardiac Rehabilitation for the exercise;Relaxation education    Continue Psychosocial Services  Follow up required by staff Follow up required by staff No Follow up required Follow up required by staff       Psychosocial Discharge (Final Psychosocial Re-Evaluation):  Psychosocial Re-Evaluation - 05/28/24 1527       Psychosocial Re-Evaluation   Current issues with History of Depression;Current Sleep Concerns    Comments Anne Barnes recently expressed sadness about her daughter's passing. Emotional support provided. Anne Barnes has good support from her surving children.    Expected Outcomes Rehema will have controlled or decreased depression upon completion of cardiac rehab    Interventions Stress management education;Encouraged to attend Cardiac Rehabilitation for the exercise;Relaxation education    Continue Psychosocial Services  Follow up required by staff          Vocational Rehabilitation: Provide vocational rehab assistance to qualifying candidates.   Vocational Rehab Evaluation & Intervention:  Vocational Rehab - 03/03/24 1206       Initial Vocational Rehab Evaluation & Intervention   Assessment shows need for Vocational Rehabilitation No   Anne Barnes is retired and does not need vocational rehab at this  time         Education: Education Goals: Education classes will be provided on a weekly basis, covering required topics. Participant will state understanding/return demonstration of topics presented.    Education     Row Name 03/09/24 1400     Education   Cardiac Education Topics Pritikin   Geographical information systems officer Psychosocial   Psychosocial Workshop Focused Goals, Sustainable Changes   Instruction Review Code 1- Verbalizes Understanding   Class Start Time 1155   Class Stop Time 1235   Class Time Calculation (min) 40 min    Row Name 03/11/24 1300     Education   Cardiac Education Topics Pritikin   Customer service manager   Weekly Topic Comforting Weekend Breakfasts   Instruction Review Code 1- Verbalizes Understanding   Class Start Time 1145   Class Stop Time 1220   Class Time Calculation (min) 35 min    Row Name 03/13/24 1100     Education   Cardiac Education Topics Pritikin   Nurse, children's   Educator Exercise Physiologist   Select Nutrition   Nutrition Dining Out - Part 1   Instruction Review Code 1- Verbalizes Understanding   Class Start Time 1147   Class Stop Time 1226   Class Time Calculation (min) 39 min  Row Name 03/16/24 1100     Education   Cardiac Education Topics Pritikin   Select Core Videos     Core Videos   Educator Exercise Physiologist   Select Exercise Education   Exercise Education Biomechanial Limitations   Instruction Review Code 1- Verbalizes Understanding   Class Start Time 1155   Class Stop Time 1235   Class Time Calculation (min) 40 min    Row Name 03/20/24 1100     Education   Cardiac Education Topics --   Select --     Core Videos   Educator --   Select --   Nutrition --   Instruction Review Code --    Row Name 03/23/24 1600     Education   Cardiac Education Topics Pritikin   Electronics engineer Nutrition   Nutrition Workshop Fueling a Forensic psychologist   Instruction Review Code 1- TEFL teacher Understanding   Class Start Time 1145   Class Stop Time 1230   Class Time Calculation (min) 45 min    Row Name 03/30/24 1300     Education   Cardiac Education Topics Pritikin   Geographical information systems officer Psychosocial   Psychosocial Workshop Healthy Sleep for a Healthy Heart   Instruction Review Code 1- Verbalizes Understanding   Class Start Time 1150   Class Stop Time 1230   Class Time Calculation (min) 40 min    Row Name 04/01/24 1100     Education   Cardiac Education Topics Pritikin   Customer service manager   Weekly Topic Simple Sides and Sauces   Instruction Review Code 1- Verbalizes Understanding   Class Start Time 1145   Class Stop Time 1225   Class Time Calculation (min) 40 min    Row Name 04/03/24 1100     Education   Cardiac Education Topics Pritikin   Nurse, children's Exercise Physiologist   Select Psychosocial   Psychosocial How Our Thoughts Can Heal Our Hearts   Instruction Review Code 1- Verbalizes Understanding   Class Start Time 1153   Class Stop Time 1241   Class Time Calculation (min) 48 min    Row Name 04/06/24 1100     Education   Cardiac Education Topics Pritikin   Hospital doctor Education   General Education Hypertension and Heart Disease   Instruction Review Code 1- Verbalizes Understanding   Class Start Time 1150   Class Stop Time 1225   Class Time Calculation (min) 35 min    Row Name 04/10/24 1300     Education   Cardiac Education Topics Pritikin   Select Workshops     Workshops   Educator Exercise Physiologist   Select Exercise   Exercise Workshop Managing Heart Disease: Your Path to a Healthier Heart    Instruction Review Code 1- Verbalizes Understanding   Class Start Time 1148   Class Stop Time 1239   Class Time Calculation (min) 51 min    Row Name 04/13/24 1300     Education   Cardiac Education Topics Pritikin   Geographical information systems officer Psychosocial   Psychosocial Workshop  From Head to Heart: The Power of a Healthy Outlook   Instruction Review Code 1- Verbalizes Understanding   Class Start Time 1152   Class Stop Time 1234   Class Time Calculation (min) 42 min    Row Name 04/15/24 1100     Education   Cardiac Education Topics Pritikin   Oceanographer and Snacks   Instruction Review Code 1- Verbalizes Understanding   Class Start Time 1145   Class Stop Time 1225   Class Time Calculation (min) 40 min    Row Name 04/29/24 1000     Education   Cardiac Education Topics Pritikin   Customer service manager   Weekly Topic Fast and Healthy Breakfasts   Instruction Review Code 1- Verbalizes Understanding   Class Start Time 1145   Class Stop Time 1220   Class Time Calculation (min) 35 min    Row Name 05/01/24 1000     Education   Cardiac Education Topics Pritikin   Licensed conveyancer Nutrition   Nutrition Other   Instruction Review Code 1- Verbalizes Understanding   Class Start Time 1145   Class Stop Time 1225   Class Time Calculation (min) 40 min    Row Name 05/04/24 1100     Education   Cardiac Education Topics Pritikin   Hospital doctor Education   General Education Metabolic Syndrome and Belly Fat   Instruction Review Code 1- Verbalizes Understanding   Class Start Time 1200   Class Stop Time 1235   Class Time Calculation (min) 35 min     Row Name 05/06/24 1100     Education   Cardiac Education Topics Pritikin   Orthoptist   Educator Dietitian   Weekly Topic Personalizing Your Pritikin Plate   Instruction Review Code 1- Verbalizes Understanding   Class Start Time 1145   Class Stop Time 1225   Class Time Calculation (min) 40 min    Row Name 05/08/24 1100     Education   Cardiac Education Topics Pritikin   Licensed conveyancer Nutrition   Nutrition Overview of the Pritikin Eating Plan   Instruction Review Code [place] Understanding    Row Name 05/11/24 1100     Education   Cardiac Education Topics Pritikin   Select Workshops     Workshops   Educator Exercise Physiologist   Select Psychosocial   Psychosocial Workshop Recognizing and Reducing Stress   Instruction Review Code 1- Verbalizes Understanding   Class Start Time 1150   Class Stop Time 1232   Class Time Calculation (min) 42 min    Row Name 05/15/24 1100     Education   Cardiac Education Topics Pritikin   Nurse, children's   Educator Dietitian   Select Nutrition   Nutrition Calorie Density   Instruction Review Code 1- Verbalizes Understanding   Class Start Time 1145   Class Stop Time 1225   Class Time Calculation (min) 40 min    Row  Name 05/20/24 1100     Education   Cardiac Education Topics Pritikin   Environmental education officer - Meals in a Snap   Instruction Review Code 1- Verbalizes Understanding   Class Start Time 1145   Class Stop Time 1230   Class Time Calculation (min) 45 min    Row Name 05/22/24 1000     Education   Cardiac Education Topics Pritikin   Psychologist, forensic Exercise Education   Exercise Education Move It!   Instruction Review Code 1- Verbalizes Understanding   Class Start  Time 1145   Class Stop Time 1224   Class Time Calculation (min) 39 min    Row Name 05/27/24 1100     Education   Cardiac Education Topics Pritikin   Orthoptist   Educator Dietitian   Weekly Topic One-Pot Wonders   Instruction Review Code 1- Verbalizes Understanding   Class Start Time 1146   Class Stop Time 1225   Class Time Calculation (min) 39 min    Row Name 05/29/24 1100     Education   Cardiac Education Topics Pritikin   Writer General Education   General Education Hypertension and Heart Disease   Instruction Review Code 1- Verbalizes Understanding   Class Start Time 1153   Class Stop Time 1230   Class Time Calculation (min) 37 min    Row Name 06/01/24 1000     Education   Cardiac Education Topics Pritikin   Geographical information systems officer Psychosocial   Psychosocial Workshop Focused Goals, Sustainable Changes   Instruction Review Code 1- Verbalizes Understanding   Class Start Time 1155   Class Stop Time 1235   Class Time Calculation (min) 40 min      Core Videos: Exercise    Move It!  Clinical staff conducted group or individual video education with verbal and written material and guidebook.  Patient learns the recommended Pritikin exercise program. Exercise with the goal of living a long, healthy life. Some of the health benefits of exercise include controlled diabetes, healthier blood pressure levels, improved cholesterol levels, improved heart and lung capacity, improved sleep, and better body composition. Everyone should speak with their doctor before starting or changing an exercise routine.  Biomechanical Limitations Clinical staff conducted group or individual video education with verbal and written material and guidebook.  Patient learns how biomechanical limitations can impact exercise and how we can mitigate and possibly  overcome limitations to have an impactful and balanced exercise routine.  Body Composition Clinical staff conducted group or individual video education with verbal and written material and guidebook.  Patient learns that body composition (ratio of muscle mass to fat mass) is a key component to assessing overall fitness, rather than body weight alone. Increased fat mass, especially visceral belly fat, can put us  at increased risk for metabolic syndrome, type 2 diabetes, heart disease, and even death. It is recommended to combine diet and exercise (cardiovascular and resistance training) to improve your body composition. Seek guidance from your physician and exercise physiologist before implementing an exercise routine.  Exercise Action Plan Clinical staff conducted group or individual video education with verbal  and written material and guidebook.  Patient learns the recommended strategies to achieve and enjoy long-term exercise adherence, including variety, self-motivation, self-efficacy, and positive decision making. Benefits of exercise include fitness, good health, weight management, more energy, better sleep, less stress, and overall well-being.  Medical   Heart Disease Risk Reduction Clinical staff conducted group or individual video education with verbal and written material and guidebook.  Patient learns our heart is our most vital organ as it circulates oxygen, nutrients, white blood cells, and hormones throughout the entire body, and carries waste away. Data supports a plant-based eating plan like the Pritikin Program for its effectiveness in slowing progression of and reversing heart disease. The video provides a number of recommendations to address heart disease.   Metabolic Syndrome and Belly Fat  Clinical staff conducted group or individual video education with verbal and written material and guidebook.  Patient learns what metabolic syndrome is, how it leads to heart disease, and how  one can reverse it and keep it from coming back. You have metabolic syndrome if you have 3 of the following 5 criteria: abdominal obesity, high blood pressure, high triglycerides, low HDL cholesterol, and high blood sugar.  Hypertension and Heart Disease Clinical staff conducted group or individual video education with verbal and written material and guidebook.  Patient learns that high blood pressure, or hypertension, is very common in the [place] . Hypertension is largely due to excessive salt intake, but other important risk factors include being overweight, physical inactivity, drinking too much alcohol, smoking, and not eating enough potassium from fruits and vegetables. High blood pressure is a leading risk factor for heart attack, stroke, congestive heart failure, dementia, kidney failure, and premature death. Long-term effects of excessive salt intake include stiffening of the arteries and thickening of heart muscle and organ damage. Recommendations include ways to reduce hypertension and the risk of heart disease.  Diseases of Our Time - Focusing on Diabetes Clinical staff conducted group or individual video education with verbal and written material and guidebook.  Patient learns why the best way to stop diseases of our time is prevention, through food and other lifestyle changes. Medicine (such as prescription pills and surgeries) is often only a Band-Aid on the problem, not a long-term solution. Most common diseases of our time include obesity, type 2 diabetes, hypertension, heart disease, and cancer. The Pritikin Program is recommended and has been proven to help reduce, reverse, and/or prevent the damaging effects of metabolic syndrome.  Nutrition   Overview of the Pritikin Eating Plan  Clinical staff conducted group or individual video education with verbal and written material and guidebook.  Patient learns about the Pritikin Eating Plan for disease risk reduction. The Pritikin  Eating Plan emphasizes a wide variety of unrefined, minimally-processed carbohydrates, like fruits, vegetables, whole grains, and legumes. Go, Caution, and Stop food choices are explained. Plant-based and lean animal proteins are emphasized. Rationale provided for low sodium intake for blood pressure control, low added sugars for blood sugar stabilization, and low added fats and oils for coronary artery disease risk reduction and weight management.  Calorie Density  Clinical staff conducted group or individual video education with verbal and written material and guidebook.  Patient learns about calorie density and how it impacts the Pritikin Eating Plan. Knowing the characteristics of the food you choose will help you decide whether those foods will lead to weight gain or weight loss, and whether you want to consume more or less of them. Weight loss is usually  a side effect of the Pritikin Eating Plan because of its focus on low calorie-dense foods.  Label Reading  Clinical staff conducted group or individual video education with verbal and written material and guidebook.  Patient learns about the Pritikin recommended label reading guidelines and corresponding recommendations regarding calorie density, added sugars, sodium content, and whole grains.  Dining Out - Part 1  Clinical staff conducted group or individual video education with verbal and written material and guidebook.  Patient learns that restaurant meals can be sabotaging because they can be so high in calories, fat, sodium, and/or sugar. Patient learns recommended strategies on how to positively address this and avoid unhealthy pitfalls.  Facts on Fats  Clinical staff conducted group or individual video education with verbal and written material and guidebook.  Patient learns that lifestyle modifications can be just as effective, if not more so, as many medications for lowering your risk of heart disease. A Pritikin lifestyle can help to  reduce your risk of inflammation and atherosclerosis (cholesterol build-up, or plaque, in the artery walls). Lifestyle interventions such as dietary choices and physical activity address the cause of atherosclerosis. A review of the types of fats and their impact on blood cholesterol levels, along with dietary recommendations to reduce fat intake is also included.  Nutrition Action Plan  Clinical staff conducted group or individual video education with verbal and written material and guidebook.  Patient learns how to incorporate Pritikin recommendations into their lifestyle. Recommendations include planning and keeping personal health goals in mind as an important part of their success.  Healthy Mind-Set    Healthy Minds, Bodies, Hearts  Clinical staff conducted group or individual video education with verbal and written material and guidebook.  Patient learns how to identify when they are stressed. Video will discuss the impact of that stress, as well as the many benefits of stress management. Patient will also be introduced to stress management techniques. The way we think, act, and feel has an impact on our hearts.  How Our Thoughts Can Heal Our Hearts  Clinical staff conducted group or individual video education with verbal and written material and guidebook.  Patient learns that negative thoughts can cause depression and anxiety. This can result in negative lifestyle behavior and serious health problems. Cognitive behavioral therapy is an effective method to help control our thoughts in order to change and improve our emotional outlook.  Additional Videos:  Exercise    Improving Performance  Clinical staff conducted group or individual video education with verbal and written material and guidebook.  Patient learns to use a non-linear approach by alternating intensity levels and lengths of time spent exercising to help burn more calories and lose more body fat. Cardiovascular exercise helps  improve heart health, metabolism, hormonal balance, blood sugar control, and recovery from fatigue. Resistance training improves strength, endurance, balance, coordination, reaction time, metabolism, and muscle mass. Flexibility exercise improves circulation, posture, and balance. Seek guidance from your physician and exercise physiologist before implementing an exercise routine and learn your capabilities and proper form for all exercise.  Introduction to Yoga  Clinical staff conducted group or individual video education with verbal and written material and guidebook.  Patient learns about yoga, a discipline of the coming together of mind, breath, and body. The benefits of yoga include improved flexibility, improved range of motion, better posture and core strength, increased lung function, weight loss, and positive self-image. Yoga's heart health benefits include lowered blood pressure, healthier heart rate, decreased cholesterol and triglyceride  levels, improved immune function, and reduced stress. Seek guidance from your physician and exercise physiologist before implementing an exercise routine and learn your capabilities and proper form for all exercise.  Medical   Aging: Enhancing Your Quality of Life  Clinical staff conducted group or individual video education with verbal and written material and guidebook.  Patient learns key strategies and recommendations to stay in good physical health and enhance quality of life, such as prevention strategies, having an advocate, securing a Health Care Proxy and Power of Attorney, and keeping a list of medications and system for tracking them. It also discusses how to avoid risk for bone loss.  Biology of Weight Control  Clinical staff conducted group or individual video education with verbal and written material and guidebook.  Patient learns that weight gain occurs because we consume more calories than we burn (eating more, moving less). Even if your body  weight is normal, you may have higher ratios of fat compared to muscle mass. Too much body fat puts you at increased risk for cardiovascular disease, heart attack, stroke, type 2 diabetes, and obesity-related cancers. In addition to exercise, following the Pritikin Eating Plan can help reduce your risk.  Decoding Lab Results  Clinical staff conducted group or individual video education with verbal and written material and guidebook.  Patient learns that lab test reflects one measurement whose values change over time and are influenced by many factors, including medication, stress, sleep, exercise, food, hydration, pre-existing medical conditions, and more. It is recommended to use the knowledge from this video to become more involved with your lab results and evaluate your numbers to speak with your doctor.   Diseases of Our Time - Overview  Clinical staff conducted group or individual video education with verbal and written material and guidebook.  Patient learns that according to the CDC, 50% to 70% of chronic diseases (such as obesity, type 2 diabetes, elevated lipids, hypertension, and heart disease) are avoidable through lifestyle improvements including healthier food choices, listening to satiety cues, and increased physical activity.  Sleep Disorders Clinical staff conducted group or individual video education with verbal and written material and guidebook.  Patient learns how good quality and duration of sleep are important to overall health and well-being. Patient also learns about sleep disorders and how they impact health along with recommendations to address them, including discussing with a physician.  Nutrition  Dining Out - Part 2 Clinical staff conducted group or individual video education with verbal and written material and guidebook.  Patient learns how to plan ahead and communicate in order to maximize their dining experience in a healthy and nutritious manner. Included are  recommended food choices based on the type of restaurant the patient is visiting.   Fueling a Banker conducted group or individual video education with verbal and written material and guidebook.  There is a strong connection between our food choices and our health. Diseases like obesity and type 2 diabetes are very prevalent and are in large-part due to lifestyle choices. The Pritikin Eating Plan provides plenty of food and hunger-curbing satisfaction. It is easy to follow, affordable, and helps reduce health risks.  Menu Workshop  Clinical staff conducted group or individual video education with verbal and written material and guidebook.  Patient learns that restaurant meals can sabotage health goals because they are often packed with calories, fat, sodium, and sugar. Recommendations include strategies to plan ahead and to communicate with the manager, chef, or server to  help order a healthier meal.  Planning Your Eating Strategy  Clinical staff conducted group or individual video education with verbal and written material and guidebook.  Patient learns about the Pritikin Eating Plan and its benefit of reducing the risk of disease. The Pritikin Eating Plan does not focus on calories. Instead, it emphasizes high-quality, nutrient-rich foods. By knowing the characteristics of the foods, we choose, we can determine their calorie density and make informed decisions.  Targeting Your Nutrition Priorities  Clinical staff conducted group or individual video education with verbal and written material and guidebook.  Patient learns that lifestyle habits have a tremendous impact on disease risk and progression. This video provides eating and physical activity recommendations based on your personal health goals, such as reducing LDL cholesterol, losing weight, preventing or controlling type 2 diabetes, and reducing high blood pressure.  Vitamins and Minerals  Clinical staff conducted  group or individual video education with verbal and written material and guidebook.  Patient learns different ways to obtain key vitamins and minerals, including through a recommended healthy diet. It is important to discuss all supplements you take with your doctor.   Healthy Mind-Set    Smoking Cessation  Clinical staff conducted group or individual video education with verbal and written material and guidebook.  Patient learns that cigarette smoking and tobacco addiction pose a serious health risk which affects millions of people. Stopping smoking will significantly reduce the risk of heart disease, lung disease, and many forms of cancer. Recommended strategies for quitting are covered, including working with your doctor to develop a successful plan.  Culinary   Becoming a Set designer conducted group or individual video education with verbal and written material and guidebook.  Patient learns that cooking at home can be healthy, cost-effective, quick, and puts them in control. Keys to cooking healthy recipes will include looking at your recipe, assessing your equipment needs, planning ahead, making it simple, choosing cost-effective seasonal ingredients, and limiting the use of added fats, salts, and sugars.  Cooking - Breakfast and Snacks  Clinical staff conducted group or individual video education with verbal and written material and guidebook.  Patient learns how important breakfast is to satiety and nutrition through the entire day. Recommendations include key foods to eat during breakfast to help stabilize blood sugar levels and to prevent overeating at meals later in the day. Planning ahead is also a key component.  Cooking - Educational psychologist conducted group or individual video education with verbal and written material and guidebook.  Patient learns eating strategies to improve overall health, including an approach to cook more at home. Recommendations  include thinking of animal protein as a side on your plate rather than center stage and focusing instead on lower calorie dense options like vegetables, fruits, whole grains, and plant-based proteins, such as beans. Making sauces in large quantities to freeze for later and leaving the skin on your vegetables are also recommended to maximize your experience.  Cooking - Healthy Salads and Dressing Clinical staff conducted group or individual video education with verbal and written material and guidebook.  Patient learns that vegetables, fruits, whole grains, and legumes are the foundations of the Pritikin Eating Plan. Recommendations include how to incorporate each of these in flavorful and healthy salads, and how to create homemade salad dressings. Proper handling of ingredients is also covered. Cooking - Soups and [employer] - Soups and Desserts Clinical staff conducted group or individual video education with  verbal and written material and guidebook.  Patient learns that Pritikin soups and desserts make for easy, nutritious, and delicious snacks and meal components that are low in sodium, fat, sugar, and calorie density, while high in vitamins, minerals, and filling fiber. Recommendations include simple and healthy ideas for soups and desserts.   Overview     The Pritikin Solution Program Overview Clinical staff conducted group or individual video education with verbal and written material and guidebook.  Patient learns that the results of the Pritikin Program have been documented in more than 100 articles published in peer-reviewed journals, and the benefits include reducing risk factors for (and, in some cases, even reversing) high cholesterol, high blood pressure, type 2 diabetes, obesity, and more! An overview of the three key pillars of the Pritikin Program will be covered: eating well, doing regular exercise, and having a healthy mind-set.  WORKSHOPS  Exercise: Exercise Basics:  Building Your Action Plan Clinical staff led group instruction and group discussion with PowerPoint presentation and patient guidebook. To enhance the learning environment the use of posters, models and videos may be added. At the conclusion of this workshop, patients will comprehend the difference between physical activity and exercise, as well as the benefits of incorporating both, into their routine. Patients will understand the FITT (Frequency, Intensity, Time, and Type) principle and how to use it to build an exercise action plan. In addition, safety concerns and other considerations for exercise and cardiac rehab will be addressed by the presenter. The purpose of this lesson is to promote a comprehensive and effective weekly exercise routine in order to improve patients' overall level of fitness.   Managing Heart Disease: Your Path to a Healthier Heart Clinical staff led group instruction and group discussion with PowerPoint presentation and patient guidebook. To enhance the learning environment the use of posters, models and videos may be added.At the conclusion of this workshop, patients will understand the anatomy and physiology of the heart. Additionally, they will understand how Pritikin's three pillars impact the risk factors, the progression, and the management of heart disease.  The purpose of this lesson is to provide a high-level overview of the heart, heart disease, and how the Pritikin lifestyle positively impacts risk factors.  Exercise Biomechanics Clinical staff led group instruction and group discussion with PowerPoint presentation and patient guidebook. To enhance the learning environment the use of posters, models and videos may be added. Patients will learn how the structural parts of their bodies function and how these functions impact their daily activities, movement, and exercise. Patients will learn how to promote a neutral spine, learn how to manage pain, and identify  ways to improve their physical movement in order to promote healthy living. The purpose of this lesson is to expose patients to common physical limitations that impact physical activity. Participants will learn practical ways to adapt and manage aches and pains, and to minimize their effect on regular exercise. Patients will learn how to maintain good posture while sitting, walking, and lifting.  Balance Training and Fall Prevention  Clinical staff led group instruction and group discussion with PowerPoint presentation and patient guidebook. To enhance the learning environment the use of posters, models and videos may be added. At the conclusion of this workshop, patients will understand the importance of their sensorimotor skills (vision, proprioception, and the vestibular system) in maintaining their ability to balance as they age. Patients will apply a variety of balancing exercises that are appropriate for their current level of function. Patients will  understand the common causes for poor balance, possible solutions to these problems, and ways to modify their physical environment in order to minimize their fall risk. The purpose of this lesson is to teach patients about the importance of maintaining balance as they age and ways to minimize their risk of falling.  WORKSHOPS   Nutrition:  Fueling a Ship broker led group instruction and group discussion with PowerPoint presentation and patient guidebook. To enhance the learning environment the use of posters, models and videos may be added. Patients will review the foundational principles of the Pritikin Eating Plan and understand what constitutes a serving size in each of the food groups. Patients will also learn Pritikin-friendly foods that are better choices when away from home and review make-ahead meal and snack options. Calorie density will be reviewed and applied to three nutrition priorities: weight maintenance, weight  loss, and weight gain. The purpose of this lesson is to reinforce (in a group setting) the key concepts around what patients are recommended to eat and how to apply these guidelines when away from home by planning and selecting Pritikin-friendly options. Patients will understand how calorie density may be adjusted for different weight management goals.  Mindful Eating  Clinical staff led group instruction and group discussion with PowerPoint presentation and patient guidebook. To enhance the learning environment the use of posters, models and videos may be added. Patients will briefly review the concepts of the Pritikin Eating Plan and the importance of low-calorie dense foods. The concept of mindful eating will be introduced as well as the importance of paying attention to internal hunger signals. Triggers for non-hunger eating and techniques for dealing with triggers will be explored. The purpose of this lesson is to provide patients with the opportunity to review the basic principles of the Pritikin Eating Plan, discuss the value of eating mindfully and how to measure internal cues of hunger and fullness using the Hunger Scale. Patients will also discuss reasons for non-hunger eating and learn strategies to use for controlling emotional eating.  Targeting Your Nutrition Priorities Clinical staff led group instruction and group discussion with PowerPoint presentation and patient guidebook. To enhance the learning environment the use of posters, models and videos may be added. Patients will learn how to determine their genetic susceptibility to disease by reviewing their family history. Patients will gain insight into the importance of diet as part of an overall healthy lifestyle in mitigating the impact of genetics and other environmental insults. The purpose of this lesson is to provide patients with the opportunity to assess their personal nutrition priorities by looking at their family history, their own  health history and current risk factors. Patients will also be able to discuss ways of prioritizing and modifying the Pritikin Eating Plan for their highest risk areas  Menu  Clinical staff led group instruction and group discussion with PowerPoint presentation and patient guidebook. To enhance the learning environment the use of posters, models and videos may be added. Using menus brought in from [employer], or printed from [employer], patients will apply the Pritikin dining out guidelines that were presented in the [employer] video. Patients will also be able to practice these guidelines in a variety of provided scenarios. The purpose of this lesson is to provide patients with the opportunity to practice hands-on learning of the Pritikin Dining Out guidelines with actual menus and practice scenarios.  Label Reading Clinical staff led group instruction and group discussion with PowerPoint presentation and  patient guidebook. To enhance the learning environment the use of posters, models and videos may be added. Patients will review and discuss the Pritikin label reading guidelines presented in Pritikin's Label Reading Educational series video. Using fool labels brought in from local grocery stores and markets, patients will apply the label reading guidelines and determine if the packaged food meet the Pritikin guidelines. The purpose of this lesson is to provide patients with the opportunity to review, discuss, and practice hands-on learning of the Pritikin Label Reading guidelines with actual packaged food labels. Cooking School  Pritikin's [employer] are designed to teach patients ways to prepare quick, simple, and affordable recipes at home. The importance of nutrition's role in chronic disease risk reduction is reflected in its emphasis in the overall Pritikin program. By learning how to prepare essential core Pritikin Eating Plan recipes, patients will  increase control over what they eat; be able to customize the flavor of foods without the use of added salt, sugar, or fat; and improve the quality of the food they consume. By learning a set of core recipes which are easily assembled, quickly prepared, and affordable, patients are more likely to prepare more healthy foods at home. These workshops focus on convenient breakfasts, simple entres, side dishes, and desserts which can be prepared with minimal effort and are consistent with nutrition recommendations for cardiovascular risk reduction. Cooking [employer] are taught by a Armed forces logistics/support/administrative officer (RD) who has been trained by the [employer]. The chef or RD has a clear understanding of the importance of minimizing - if not completely eliminating - added fat, sugar, and sodium in recipes. Throughout the series of Cooking School Workshop sessions, patients will learn about healthy ingredients and efficient methods of cooking to build confidence in their capability to prepare    Cooking School weekly topics:  Adding Flavor- Sodium-Free  Fast and Healthy Breakfasts  Powerhouse Plant-Based Proteins  Satisfying Salads and Dressings  Simple Sides and Sauces  International Cuisine-Spotlight on the [employer] Zones  Delicious Desserts  Savory Soups  [employer] - Meals in a Astronomer Appetizers and Snacks  Comforting Weekend Breakfasts  One-Pot Wonders   Fast Evening Meals  Landscape architect Your Pritikin Plate  WORKSHOPS   Healthy Mindset (Psychosocial):  Focused Goals, Sustainable Changes Clinical staff led group instruction and group discussion with PowerPoint presentation and patient guidebook. To enhance the learning environment the use of posters, models and videos may be added. Patients will be able to apply effective goal setting strategies to establish at least one personal goal, and then take consistent, meaningful action toward that goal.  They will learn to identify common barriers to achieving personal goals and develop strategies to overcome them. Patients will also gain an understanding of how our mind-set can impact our ability to achieve goals and the importance of cultivating a positive and growth-oriented mind-set. The purpose of this lesson is to provide patients with a deeper understanding of how to set and achieve personal goals, as well as the tools and strategies needed to overcome common obstacles which may arise along the way.  From Head to Heart: The Power of a Healthy Outlook  Clinical staff led group instruction and group discussion with PowerPoint presentation and patient guidebook. To enhance the learning environment the use of posters, models and videos may be added. Patients will be able to recognize and describe the impact of emotions and mood on physical health. They will discover the  importance of self-care and explore self-care practices which may work for them. Patients will also learn how to utilize the 4 C's to cultivate a healthier outlook and better manage stress and challenges. The purpose of this lesson is to demonstrate to patients how a healthy outlook is an essential part of maintaining good health, especially as they continue their cardiac rehab journey.  Healthy Sleep for a Healthy Heart Clinical staff led group instruction and group discussion with PowerPoint presentation and patient guidebook. To enhance the learning environment the use of posters, models and videos may be added. At the conclusion of this workshop, patients will be able to demonstrate knowledge of the importance of sleep to overall health, well-being, and quality of life. They will understand the symptoms of, and treatments for, common sleep disorders. Patients will also be able to identify daytime and nighttime behaviors which impact sleep, and they will be able to apply these tools to help manage sleep-related challenges. The purpose of  this lesson is to provide patients with a general overview of sleep and outline the importance of quality sleep. Patients will learn about a few of the most common sleep disorders. Patients will also be introduced to the concept of "sleep hygiene," and discover ways to self-manage certain sleeping problems through simple daily behavior changes. Finally, the workshop will motivate patients by clarifying the links between quality sleep and their goals of heart-healthy living.   Recognizing and Reducing Stress Clinical staff led group instruction and group discussion with PowerPoint presentation and patient guidebook. To enhance the learning environment the use of posters, models and videos may be added. At the conclusion of this workshop, patients will be able to understand the types of stress reactions, differentiate between acute and chronic stress, and recognize the impact that chronic stress has on their health. They will also be able to apply different coping mechanisms, such as reframing negative self-talk. Patients will have the opportunity to practice a variety of stress management techniques, such as deep abdominal breathing, progressive muscle relaxation, and/or guided imagery.  The purpose of this lesson is to educate patients on the role of stress in their lives and to provide healthy techniques for coping with it.  Learning Barriers/Preferences:  Learning Barriers/Preferences - 03/03/24 1204       Learning Barriers/Preferences   Learning Barriers Hearing;Sight;Exercise Concerns   Anne Barnes wears glasses and has a hearing deficit. Anne Barnes says she becomes lightheaded due to issues with allergies.   Learning Preferences Skilled Demonstration          Education Topics:  Knowledge Questionnaire Score:  Knowledge Questionnaire Score - 03/03/24 1402       Knowledge Questionnaire Score   Pre Score 19/24          Core Components/Risk Factors/Patient Goals at Admission:  Personal Goals  and Risk Factors at Admission - 03/03/24 1144       Core Components/Risk Factors/Patient Goals on Admission    Weight Management Yes;Obesity;Weight Loss    Intervention Weight Management/Obesity: Establish reasonable short term and long term weight goals.;Obesity: Provide education and appropriate resources to help participant work on and attain dietary goals.    Admit Weight 218 lb 14.7 oz (99.3 kg)    Expected Outcomes Short Term: Continue to assess and modify interventions until short term weight is achieved;Long Term: Adherence to nutrition and physical activity/exercise program aimed toward attainment of established weight goal;Weight Loss: Understanding of general recommendations for a balanced deficit meal plan, which promotes 1-2 lb weight loss  per week and includes a negative energy balance of [phone] kcal/d    Hypertension Yes    Intervention Provide education on lifestyle modifcations including regular physical activity/exercise, weight management, moderate sodium restriction and increased consumption of fresh fruit, vegetables, and low fat dairy, alcohol moderation, and smoking cessation.;Monitor prescription use compliance.    Expected Outcomes Short Term: Continued assessment and intervention until BP is < 140/84mm HG in hypertensive participants. < 130/67mm HG in hypertensive participants with diabetes, heart failure or chronic kidney disease.;Long Term: Maintenance of blood pressure at goal levels.    Lipids Yes    Intervention Provide education and support for participant on nutrition & aerobic/resistive exercise along with prescribed medications to achieve LDL 70mg , HDL >40mg .    Expected Outcomes Short Term: Participant states understanding of desired cholesterol values and is compliant with medications prescribed. Participant is following exercise prescription and nutrition guidelines.;Long Term: Cholesterol controlled with medications as prescribed, with individualized exercise RX  and with personalized nutrition plan. Value goals: LDL < 70mg , HDL > 40 mg.          Core Components/Risk Factors/Patient Goals Review:   Goals and Risk Factor Review     Row Name 03/09/24 1743 03/31/24 1427 05/05/24 1426 06/01/24 1714       Core Components/Risk Factors/Patient Goals Review   Personal Goals Review Weight Management/Obesity;Hypertension;Lipids Weight Management/Obesity;Hypertension;Lipids Weight Management/Obesity;Hypertension;Lipids Weight Management/Obesity;Hypertension;Lipids    Review Anne Barnes started cardiac rehab on 03/09/24. Anne Barnes did fair with exercise for her fitness level Vital signs were stable. Anne Barnes is doing well with exercise  at cardiac rehab. Vital signs have been stable. Anne Barnes says that participating in cardiac rehab has been helpful for her. Elpidia says that she feels stronger. Asami continues to do  well with exercise  at cardiac rehab. Vital signs remain stable. Yisel says that participating in cardiac rehab has been helpful for her. Jericca says that she feels stronger. Severa will complete cardiac rehab at the begining of September. Yukie continues to do  well with exercise  at cardiac rehab. Vital signs remain stable.  Emmaline will complete cardiac rehab on 06/05/24    Expected Outcomes Matrtha will continue to participat in cardiac rehab for exercise nutrition and lifestyle modificaitons Wyvonna will continue to participate in cardiac rehab for exercise nutrition and lifestyle modificaitons Wyvonna will continue to participate in cardiac rehab for exercise nutrition and lifestyle modificaitons Wyvonna will continue to participate in cardiac rehab for exercise nutrition and lifestyle modificaitons       Core Components/Risk Factors/Patient Goals at Discharge (Final Review):   Goals and Risk Factor Review - 06/01/24 1714       Core Components/Risk Factors/Patient Goals Review   Personal Goals Review Weight Management/Obesity;Hypertension;Lipids     Review Camerin continues to do  well with exercise  at cardiac rehab. Vital signs remain stable.  Deisy will complete cardiac rehab on 06/05/24    Expected Outcomes Wyvonna will continue to participate in cardiac rehab for exercise nutrition and lifestyle modificaitons          ITP Comments:  ITP Comments     Row Name 03/03/24 1059 03/09/24 1135 03/31/24 1423 05/05/24 1423 05/28/24 1526   ITP Comments Dr Wilbert Bihari Anne Barnes, Medical Director. Introduction to [facility] Program/ Intensive Cardiac Rehab. Initial Orientation packet Reviewed with the patient. 30-day ITP review. Marquelle completed first session of exercise well without symptoms. Oriented her to the exercise equipment and stretching routine. 30-day ITP review. Khaleesi has good attendance and participation with exercise  at cardiac rehab 30-day ITP review. Shawntee continues to have good  participation with exercise at cardiac rehab. Lynnex was absent recently to visit her brother in [place] . 30-day ITP review. Mckinzi continues to have good attendance and  participation with exercise at cardiac rehab. Mariaeduarda will tenatively complete cardiac rehab on 06/05/24      Comments: See ITP Comments

## 2024-06-03 ENCOUNTER — Encounter (HOSPITAL_COMMUNITY)
Admission: RE | Admit: 2024-06-03 | Discharge: 2024-06-03 | Disposition: A | Discharge status: Home / Self Care | Source: Ambulatory Visit | Attending: Internal Medicine | Admitting: Internal Medicine

## 2024-06-03 ENCOUNTER — Other Ambulatory Visit: Payer: Self-pay | Admitting: Allergy

## 2024-06-03 DIAGNOSIS — Z955 Presence of coronary angioplasty implant and graft: Secondary | ICD-10-CM

## 2024-06-03 DIAGNOSIS — I2111 ST elevation (STEMI) myocardial infarction involving right coronary artery: Secondary | ICD-10-CM

## 2024-06-05 ENCOUNTER — Encounter (HOSPITAL_COMMUNITY)
Admission: RE | Admit: 2024-06-05 | Discharge: 2024-06-05 | Disposition: A | Discharge status: Home / Self Care | Source: Ambulatory Visit | Attending: Internal Medicine | Admitting: Internal Medicine

## 2024-06-05 VITALS — BP 128/68 | HR 84 | Ht 60.75 in | Wt 221.3 lb

## 2024-06-05 DIAGNOSIS — Z955 Presence of coronary angioplasty implant and graft: Secondary | ICD-10-CM

## 2024-06-05 DIAGNOSIS — I2111 ST elevation (STEMI) myocardial infarction involving right coronary artery: Secondary | ICD-10-CM

## 2024-06-05 NOTE — Progress Notes (Signed)
 Discharge Progress Report  Patient Details  Name: ALAYSHA JEFCOAT MRN: [id number] Date of Birth: 07/15/1946 Referring Provider:   Flowsheet Row INTENSIVE CARDIAC REHAB ORIENT from 03/03/2024 in [facility]  Referring Provider Santo Stanly DELENA, MD     Number of Visits: 55  Reason for Discharge:  Patient has met program and personal goals.  Smoking History:  Social History   Tobacco Use  Smoking Status Never  Smokeless Tobacco Never  Tobacco Comments   dtr 2 g-kids.    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  ADL UCSD:   Initial Exercise Prescription:  Initial Exercise Prescription - 03/03/24 1300       Date of Initial Exercise RX and Referring Provider   Date 03/03/24    Referring Provider Santo Stanly DELENA, MD    Expected Discharge Date 05/27/24      NuStep   Level 1    SPM 75    Minutes 15    METs 1.5      Prescription Details   Frequency (times per week) 3    Duration Progress to 30 minutes of continuous aerobic without signs/symptoms of physical distress      Intensity   THRR 40-80% of Max Heartrate 58-115    Ratings of Perceived Exertion 11-13    Perceived Dyspnea 0-4      Progression   Progression Continue to progress workloads to maintain intensity without signs/symptoms of physical distress.      Resistance Training   Training Prescription Yes    Weight 2 lbs    Reps 10-15          Discharge Exercise Prescription (Final Exercise Prescription Changes):  Exercise Prescription Changes - 06/05/24 1031       Response to Exercise   Blood Pressure (Admit) 128/68    Blood Pressure (Exit) 142/72    Heart Rate (Admit) 84 bpm    Heart Rate (Exercise) 123 bpm    Heart Rate (Exit) 104 bpm    Rating of Perceived Exertion (Exercise) 11    Symptoms None    Comments Justene completed the cardiac rehab program today.    Duration Continue with 30 min of aerobic  exercise without signs/symptoms of physical distress.    Intensity THRR unchanged      Progression   Progression Continue to progress workloads to maintain intensity without signs/symptoms of physical distress.    Average METs 2.2      Resistance Training   Training Prescription Yes    Weight 2 lbs    Reps 10-15    Time 5 Minutes      Interval Training   Interval Training No      NuStep   Level 4    SPM 119    Minutes 30    METs 2.2      Home Exercise Plan   Plans to continue exercise at Home (comment)   Walking   Frequency Add 3 additional days to program exercise sessions.    Initial Home Exercises Provided 03/25/24          Functional Capacity:  6 Minute Walk     Row Name 03/03/24 1200 05/29/24 1049       6 Minute Walk   Phase Initial Discharge    Distance 720 feet 953 feet    Distance % Change -- 32.36 %    Distance Feet Change -- 233 ft    Walk  Time 6 minutes 6 minutes    # of Rest Breaks 0 0    MPH 1.36 1.8    METS 0.91 1.66    RPE 13 11    Perceived Dyspnea  2 0    VO2 Peak 3.18 5.82    Symptoms Yes (comment) Yes (comment)    Comments Mild shortness of breath. Left leg and foot weakness. Left knee pain, chronic, 4/10 on the pain scale.    Resting HR 75 bpm 97 bpm    Resting BP 134/78 132/72    Resting Oxygen Saturation  97 % --    Exercise Oxygen Saturation  during 6 min walk 96 % --    Max Ex. HR 102 bpm 122 bpm    Max Ex. BP 148/68 162/52    2 Minute Post BP 150/72 --       Psychological, QOL, Others - Outcomes: PHQ 2/9:    06/03/2024    3:33 PM 03/03/2024    1:55 PM 02/05/2024    3:27 PM 07/25/2023    1:26 PM 03/05/2023    2:21 PM  Depression screen PHQ 2/9  Decreased Interest 0 0 0 0 0  Down, Depressed, Hopeless 0 0 0 0 0  PHQ - 2 Score 0 0 0 0 0  Altered sleeping 1 2   0  Tired, decreased energy 1 2   0  Change in appetite  0   0  Feeling bad or failure about yourself  1 0   0  Trouble concentrating 0 0   0  Moving slowly or  fidgety/restless 0 0   0  Suicidal thoughts 0 0   0  PHQ-9 Score 3 4   0  Difficult doing work/chores Not difficult at all Somewhat difficult   Not difficult at all    Quality of Life:  Quality of Life - 06/03/24 1527       Quality of Life   Select Quality of Life      Quality of Life Scores   Health/Function Pre 25.03 %    Health/Function Post 27.29 %    Health/Function % Change 9.03 %    Socioeconomic Pre 26.29 %    Socioeconomic Post 27.92 %    Socioeconomic % Change  6.2 %    Psych/Spiritual Pre 28.21 %    Psych/Spiritual Post 27.86 %    Psych/Spiritual % Change -1.24 %    Family Pre 30 %    Family Post 30 %    Family % Change 0 %    GLOBAL Pre 26.58 %    GLOBAL Post 27.89 %    GLOBAL % Change 4.93 %          Personal Goals: Goals established at orientation with interventions provided to work toward goal.  Personal Goals and Risk Factors at Admission - 03/03/24 1144       Core Components/Risk Factors/Patient Goals on Admission    Weight Management Yes;Obesity;Weight Loss    Intervention Weight Management/Obesity: Establish reasonable short term and long term weight goals.;Obesity: Provide education and appropriate resources to help participant work on and attain dietary goals.    Admit Weight 218 lb 14.7 oz (99.3 kg)    Expected Outcomes Short Term: Continue to assess and modify interventions until short term weight is achieved;Long Term: Adherence to nutrition and physical activity/exercise program aimed toward attainment of established weight goal;Weight Loss: Understanding of general recommendations for a balanced deficit meal plan, which promotes 1-2 lb  weight loss per week and includes a negative energy balance of [phone] kcal/d    Hypertension Yes    Intervention Provide education on lifestyle modifcations including regular physical activity/exercise, weight management, moderate sodium restriction and increased consumption of fresh fruit, vegetables, and low  fat dairy, alcohol moderation, and smoking cessation.;Monitor prescription use compliance.    Expected Outcomes Short Term: Continued assessment and intervention until BP is < 140/13mm HG in hypertensive participants. < 130/73mm HG in hypertensive participants with diabetes, heart failure or chronic kidney disease.;Long Term: Maintenance of blood pressure at goal levels.    Lipids Yes    Intervention Provide education and support for participant on nutrition & aerobic/resistive exercise along with prescribed medications to achieve LDL 70mg , HDL >40mg .    Expected Outcomes Short Term: Participant states understanding of desired cholesterol values and is compliant with medications prescribed. Participant is following exercise prescription and nutrition guidelines.;Long Term: Cholesterol controlled with medications as prescribed, with individualized exercise RX and with personalized nutrition plan. Value goals: LDL < 70mg , HDL > 40 mg.           Personal Goals Discharge:  Goals and Risk Factor Review     Row Name 03/09/24 1743 03/31/24 1427 05/05/24 1426 06/01/24 1714       Core Components/Risk Factors/Patient Goals Review   Personal Goals Review Weight Management/Obesity;Hypertension;Lipids Weight Management/Obesity;Hypertension;Lipids Weight Management/Obesity;Hypertension;Lipids Weight Management/Obesity;Hypertension;Lipids    Review Serah started cardiac rehab on 03/09/24. Yaret did fair with exercise for her fitness level Vital signs were stable. Jaylena is doing well with exercise  at cardiac rehab. Vital signs have been stable. Jayni says that participating in cardiac rehab has been helpful for her. Nicoletta says that she feels stronger. Benetta continues to do  well with exercise  at cardiac rehab. Vital signs remain stable. Lysette says that participating in cardiac rehab has been helpful for her. Brandalyn says that she feels stronger. Charvi will complete cardiac rehab at the begining of  September. Cornelia continues to do  well with exercise  at cardiac rehab. Vital signs remain stable.  Tanekia will complete cardiac rehab on 06/05/24    Expected Outcomes Matrtha will continue to participat in cardiac rehab for exercise nutrition and lifestyle modificaitons Wyvonna will continue to participate in cardiac rehab for exercise nutrition and lifestyle modificaitons Wyvonna will continue to participate in cardiac rehab for exercise nutrition and lifestyle modificaitons Wyvonna will continue to participate in cardiac rehab for exercise nutrition and lifestyle modificaitons       Exercise Goals and Review:  Exercise Goals     Row Name 03/03/24 1144             Exercise Goals   Increase Physical Activity Yes       Intervention Provide advice, education, support and counseling about physical activity/exercise needs.;Develop an individualized exercise prescription for aerobic and resistive training based on initial evaluation findings, risk stratification, comorbidities and participant's personal goals.       Expected Outcomes Short Term: Attend rehab on a regular basis to increase amount of physical activity.;Long Term: Exercising regularly at least 3-5 days a week.;Long Term: Add in home exercise to make exercise part of routine and to increase amount of physical activity.       Increase Strength and Stamina Yes       Intervention Provide advice, education, support and counseling about physical activity/exercise needs.;Develop an individualized exercise prescription for aerobic and resistive training based on initial evaluation findings, risk stratification, comorbidities and participant's personal  goals.       Expected Outcomes Short Term: Increase workloads from initial exercise prescription for resistance, speed, and METs.;Short Term: Perform resistance training exercises routinely during rehab and add in resistance training at home;Long Term: Improve cardiorespiratory fitness, muscular  endurance and strength as measured by increased METs and functional capacity ( )       Able to understand and use rate of perceived exertion (RPE) scale Yes       Intervention Provide education and explanation on how to use RPE scale       Expected Outcomes Short Term: Able to use RPE daily in rehab to express subjective intensity level;Long Term:  Able to use RPE to guide intensity level when exercising independently       Knowledge and understanding of Target Heart Rate Range (THRR) Yes       Intervention Provide education and explanation of THRR including how the numbers were predicted and where they are located for reference       Expected Outcomes Short Term: Able to state/look up THRR;Long Term: Able to use THRR to govern intensity when exercising independently;Short Term: Able to use daily as guideline for intensity in rehab       Able to check pulse independently Yes       Intervention Provide education and demonstration on how to check pulse in carotid and radial arteries.;Review the importance of being able to check your own pulse for safety during independent exercise       Expected Outcomes Short Term: Able to explain why pulse checking is important during independent exercise;Long Term: Able to check pulse independently and accurately       Understanding of Exercise Prescription Yes       Intervention Provide education, explanation, and written materials on patient's individual exercise prescription       Expected Outcomes Short Term: Able to explain program exercise prescription;Long Term: Able to explain home exercise prescription to exercise independently          Exercise Goals Re-Evaluation:  Exercise Goals Re-Evaluation     Row Name 03/09/24 1135 03/25/24 1103 04/15/24 1133 05/01/24 1103 05/20/24 1143     Exercise Goal Re-Evaluation   Exercise Goals Review Increase Physical Activity;Increase Strength and Stamina;Able to understand and use rate of perceived exertion (RPE)  scale Increase Physical Activity;Increase Strength and Stamina;Able to understand and use rate of perceived exertion (RPE) scale;Understanding of Exercise Prescription;Able to check pulse independently;Knowledge and understanding of Target Heart Rate Range (THRR) Increase Physical Activity;Increase Strength and Stamina;Able to understand and use rate of perceived exertion (RPE) scale;Understanding of Exercise Prescription;Able to check pulse independently;Knowledge and understanding of Target Heart Rate Range (THRR) Increase Physical Activity;Increase Strength and Stamina;Able to understand and use rate of perceived exertion (RPE) scale;Understanding of Exercise Prescription;Able to check pulse independently;Knowledge and understanding of Target Heart Rate Range (THRR) Increase Physical Activity;Increase Strength and Stamina;Able to understand and use rate of perceived exertion (RPE) scale;Understanding of Exercise Prescription;Able to check pulse independently;Knowledge and understanding of Target Heart Rate Range (THRR)   Comments Chassity was able to understand and use RPE scale appropriately. Reviewed exercise prescription with [place]. She is walking in the park using her rollator and stretching. She walks 30 minutes, 3 days/week. She has a treadmill at home that she uses when it's too cold to walk outside. Kaveri is making gradual progess with exercise. Xareni has been having intermittent pain and weakness in her left leg. She's been evaluated for neuropathy, but doesn't appear to  have that issue. She plans to seek a referral to an orthopedic surgeon for further evaluation. She states the NuStep and exercise at cardiac rehab doesn't not worsen her problem. She is using her seated foot bike and elliptical machine at home. She also has a treadmill but is not currently using it. She is only able to tolerate the elliptical for 8 minutes due to dificulty. She also has access to 1-4 lb weights. She is exercising at  home Tues, Thurs, and one day during the weekend in addition to exercise at cardiac rehab. Ennis feels she's benefitting from participation in the intensive cardiac rehab program. She was able to visit her granddaughter's house for the first time in a year. She was previously unable to visit because she was unable take the stairs. She was able to walk up 16 steps without stopping and without shortness of breath. She's also states she's able to do several loads of laundry go up and down the steps to her basement where the laundry room is.   Expected Outcomes Progress workloads as tolerated to help improve cardiorespiratory fitness. Bradie will continue walking at home in addition to exercise at cardiac rehab. Progress workloads as tolerated. Lizmarie will continue exercise 6 days/week to help increase strength especially leg strength. Continue to progress workloads to help maintain health and fitness gains.    Row Name 05/29/24 1100 06/03/24 1057 06/05/24 1129         Exercise Goal Re-Evaluation   Exercise Goals Review Increase Physical Activity;Increase Strength and Stamina;Able to understand and use rate of perceived exertion (RPE) scale;Understanding of Exercise Prescription;Able to check pulse independently;Knowledge and understanding of Target Heart Rate Range (THRR) Increase Physical Activity;Increase Strength and Stamina;Able to understand and use rate of perceived exertion (RPE) scale;Understanding of Exercise Prescription;Able to check pulse independently;Knowledge and understanding of Target Heart Rate Range (THRR) Increase Physical Activity;Increase Strength and Stamina;Able to understand and use rate of perceived exertion (RPE) scale;Understanding of Exercise Prescription;Able to check pulse independently;Knowledge and understanding of Target Heart Rate Range (THRR)     Comments Maleah's functional capacity increased 32% as measired by the 6-minute walk test, strength increased 37% as measured by  the grip strength test. Brendalyn will continue walking and using her foot bike at home Matha completed the cardiac rehab and is very pleased with her participation in the program. She states she's now able to go up and down the stairs to her laundry room, and she was previously unable to do her laundry. She is walking outdoors, has a treadmill, elliptical, and foot bike that she is using as her mode of exercise.     Expected Outcomes Continue to progress workloads to help maintain health and fitness gains. Laverle will exercise 30 minutes 5-6 days/week to maintain health and fitness gains. Kennedey will exercise 30 minutes 5-6 days/week to maintain health and fitness gains.        Nutrition & Weight - Outcomes:  Pre Biometrics - 03/03/24 1058       Pre Biometrics   Waist Circumference 51.25 inches    Hip Circumference 53.25 inches    Waist to Hip Ratio 0.96 %    Triceps Skinfold 52 mm    % Body Fat 57.3 %    Grip Strength 8 kg    Flexibility 0 in    Single Leg Stand 2.87 seconds          Post Biometrics - 06/05/24 1029        Post  Biometrics  Height 5' 0.75 (1.543 m)    Waist Circumference 50 inches    Hip Circumference 50.5 inches    Waist to Hip Ratio 0.99 %    BMI (Calculated) 42.17    Triceps Skinfold 42 mm    % Body Fat 55.9 %    Grip Strength 11 kg    Flexibility 0 in    Single Leg Stand 3.5 seconds          Nutrition:  Nutrition Therapy & Goals - 05/04/24 1140       Nutrition Therapy   Diet Heart healthy Diet    Drug/Food Interactions Statins/Certain Fruits      Personal Nutrition Goals   Nutrition Goal Patient to identify strategies for reducing cardiovascular risk by attending the [facility] education and nutrition series weekly.   goal in action.   Personal Goal #2 Patient to improve diet quality by using the plate method as a guide for meal planning to include lean protein/plant protein, fruits, vegetables, whole grains, nonfat dairy as part of a  well-balanced diet.   goal in progress.   Comments Goals in progress. Patient has medical history of STEMI, HFpEF, hyperlipidemia.She has started to regularly attend the [facility] education/nutrition series.  LDL is 72 with ideal goal <44. 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 She is up  2.2# since starting with our program. Patient will benefit from participation in intensive cardiac rehab and adherence to nutrition, exercise, and lifestyle modification.      Intervention Plan   Intervention Prescribe, educate and counsel regarding individualized specific dietary modifications aiming towards targeted core components such as weight, hypertension, lipid management, diabetes, heart failure and other comorbidities.;Nutrition handout(s) given to patient.    Expected Outcomes Short Term Goal: Understand basic principles of dietary content, such as calories, fat, sodium, cholesterol and nutrients.;Long Term Goal: Adherence to prescribed nutrition plan.          Nutrition Discharge:  Nutrition Assessments - 06/03/24 1532       Rate Your Plate Scores   Pre Score 73          Education Questionnaire Score:  Knowledge Questionnaire Score - 06/03/24 1532       Knowledge Questionnaire Score   Pre Score 19/24    Post Score 22/24          Goals reviewed with patient; copy given to patient.Pt graduates from  Intensive/Traditional cardiac rehab program on 06/05/24  with completion of 29 exercise and 27 education sessions. Pt maintained good attendance and progressed nicely during their participation in rehab as evidenced by increased MET level. Zoriah increased her distance on her post exercise walk test by 233 feet.   Medication list reconciled. Repeat  PHQ score-3  .  Pt has made significant lifestyle changes and should be commended for her success. Corin achieved her goals during cardiac rehab.   Pt  plans to continue exercise at home riding her stationary bike. We are proud of Hanadi's progress!  Hadassah Elpidio Quan RN BSN

## 2024-06-12 ENCOUNTER — Other Ambulatory Visit: Payer: Self-pay | Admitting: *Deleted

## 2024-06-18 ENCOUNTER — Other Ambulatory Visit: Payer: Self-pay | Admitting: Family Medicine

## 2024-06-18 ENCOUNTER — Other Ambulatory Visit: Payer: Self-pay | Admitting: Physician Assistant

## 2024-06-19 NOTE — Telephone Encounter (Signed)
 Requesting: alprazolam  0.5mg   Contract: None UDS: None Last Visit: 04/14/24 w/ Wheeler  Next Visit: 10/20/24 w/ Wheeler Last Refill: 01/14/24 #20 and 0RF   Please Advise

## 2024-06-23 ENCOUNTER — Other Ambulatory Visit: Payer: Self-pay | Admitting: *Deleted

## 2024-06-25 ENCOUNTER — Telehealth: Payer: Self-pay | Admitting: Allergy

## 2024-06-25 NOTE — Telephone Encounter (Signed)
 Patient called and is requesting a refill for the fluticasone  ( Flonase ) and would like that sent over to the [facility] on [address] [facility]  Patient would like a call back once the prescription is sent in Best Contact: [phone]

## 2024-06-26 MED ORDER — FLUTICASONE PROPIONATE 50 MCG/ACT NA SUSP: NASAL | 5 refills | Status: DC

## 2024-06-26 NOTE — Telephone Encounter (Signed)
 Refill sent to requested pharmacy and patient informed.

## 2024-06-26 NOTE — Addendum Note (Signed)
 Addended by: Jaiyanna Safran on: 06/26/2024 10:37 AM   Modules accepted: Orders

## 2024-06-30 ENCOUNTER — Ambulatory Visit: Payer: Self-pay | Admitting: Physician Assistant

## 2024-06-30 DIAGNOSIS — I2510 Atherosclerotic heart disease of native coronary artery without angina pectoris: Secondary | ICD-10-CM

## 2024-06-30 DIAGNOSIS — E7800 Pure hypercholesterolemia, unspecified: Secondary | ICD-10-CM

## 2024-06-30 LAB — ALT: ALT: 19 IU/L (ref 0–32)

## 2024-06-30 LAB — LIPID PANEL
Chol/HDL Ratio: 3 ratio (ref 0.0–4.4)
Cholesterol, Total: 143 mg/dL (ref 100–199)
HDL: 47 mg/dL (ref >39)
LDL Chol Calc (NIH): 86 mg/dL (ref 0–99)
Triglycerides: 45 mg/dL (ref 0–149)
VLDL Cholesterol Cal: 10 mg/dL (ref 5–40)

## 2024-07-10 ENCOUNTER — Telehealth: Payer: Self-pay | Admitting: Physician Assistant

## 2024-07-10 NOTE — Telephone Encounter (Signed)
    Pt c/o of Chest Pain: STAT if active CP, including tightness, pressure, jaw pain, radiating pain to shoulder/upper arm/back, CP unrelieved by Nitro. Symptoms reported of SOB, nausea, vomiting, sweating.  1. Are you having CP right now? No    2. Are you experiencing any other symptoms (ex. SOB, nausea, vomiting, sweating)? Fatigued, lightheaded, pain in left side of chest and arm.    3. Is your CP continuous or coming and going? Coming and going, intermittent for a few seconds    4. Have you taken Nitroglycerin ? No    5. How long have you been experiencing CP? This afternoon     6. If NO CP at time of call then end call with telling Pt to call back or call 911 if Chest pain returns prior to return call from triage team.  Pt states it feels very similar to a previous heart attack she has had. Please advise. Call transferred

## 2024-07-10 NOTE — Telephone Encounter (Signed)
 All week has been feeling light-headed and dizzy all week along with weakness.   She had pain 3 times today in left side of chest- it radiates to left arm- it lasts for about 30 seconds, then goes away on its own. No sob, no sweating, no nausea with it.   She said that she did not feel like the pain was bad enough or lasted long enough to take a NTG.   Reports swelling in feet that comes and goes. Not bad, though, just mild swelling.   Made an appt to be seen by APP 07/15/24 at 2:15   Instructed her to go to the ER if the pain comes back and is not relieved by taking a Nitroglycerin , especially if she develops any new symptoms.   If you experience active Chest Pain, including tightness, pressure, jaw pain, radiating pain to shoulder/upper arm/back, Chest Pain unrelieved by 2-3 doses of Nitroglycerin , with or without Shortness Of Breath, nausea, vomiting, and/or sweating- THEN CALL 911 AND GO TO THE NEAREST EMERGENCY ROOM  She verbalized understanding of all information.

## 2024-07-12 NOTE — Telephone Encounter (Signed)
 Agree Glendia Ferrier, PA-C    07/12/2024 3:32 PM

## 2024-07-13 NOTE — Progress Notes (Unsigned)
 Cardiology Office Note   Date:  07/15/2024  ID:  Anne Barnes, Anne Barnes May 13, 1946, MRN [id number] PCP: Domenica Barnes DELENA, MD   [facility] Providers Cardiologist:  Stanly Anne Leavens, MD Cardiology APP:  Lelon Glendia DASEN, PA-C     History of Present Illness Anne Barnes is a 78 y.o. female with a past medical history of HFpEF, CAD s/p DES x 1 to pRCA, hypertension, OSA on CPAP, IBS, GERD, bradycardia.  03/13/2024 ABI normal 01/31/2024 echo EF 60 to 65%, mild LVH, trivial MR, mild calcification of aortic valve 01/30/2024 left heart cath proximal RCA 99% stenosed >> DES x 1 to pRCA 11/05/2022 echo EF 55 to 60%, mild concentric LVH, grade 2 DD, LA moderately dilated, trivial MR 12/02/2019 monitor average heart rate 57 bpm, short episodes of SVT 11/20/2019 coronary CT calcium  score 127, to 71st percentile, mild nonobstructive CAD 03/25/2017 Lexiscan  low risk study  She initially established care with Dr. Maranda in 2018 for management of her hypertension, she underwent a Lexiscan  in the same year which was a low risk study.  In 2021 she underwent a coronary CTA revealing a calcium  score of 121 mild nonobstructive CAD.  In May 2025 she presented to the hospital via EMS as a code STEMI with inferior and lateral lead ST changes as well as chest pain >> left heart cath revealed proximal RCA 99% stenosed requiring DES x 1, recommendations for DAPT for 1 year.  Most recently evaluated by Glendia Lelon, PA on 05/20/2024, she was attending cardiac rehab and doing well without any anginal complaints, blood pressure was controlled, lipids were not controlled >> recently met with Pharm.D. with plans to start PCSK9 inhibitor.  She presents today after an episode over the weekend where she had a few episodes of chest pain as well as weakness.  Regarding the chest pain it was not consistent to her prior episodes of angina leading up to her stent (which was substernal and described as epigastric pain), was not  consistent with angina, was fleeting in nature, located around her left lateral rib cage.  Regarding her weakness, it is hard for her to describe but it does not sound to be related to presyncopal sensation.  For the last few days, she has been feeling well.  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 She denies chest pain, palpitations, dyspnea, pnd, orthopnea, n, v, dizziness, syncope, edema, weight gain, or early satiety.   ROS: Review of Systems  Musculoskeletal:  Positive for joint pain.  Neurological:  Positive for dizziness.  All other systems reviewed and are negative.    Studies Reviewed EKG Interpretation Date/Time:  Wednesday July 15 2024 11:31:27 EDT Ventricular Rate:  69 PR Interval:  176 QRS Duration:  92 QT Interval:  418 QTC Calculation: 447 R Axis:   -14  Text Interpretation: Normal sinus rhythm Nonspecific T wave abnormality When compared with ECG of 14-Feb-2024 08:37, No significant change was found Confirmed by Carlin Nest [phone]) on 07/15/2024 11:35:43 AM     Risk Assessment/Calculations           Physical Exam VS:  BP 130/68   Pulse 69   Ht 4' 11 (1.499 m)   Wt 220 lb (99.8 kg)   SpO2 98%   BMI 44.43 kg/m        Wt Readings from Last 3 Encounters:  07/15/24 220  lb (99.8 kg)  06/05/24 221 lb 5.5 oz (100.4 kg)  05/20/24 218 lb (98.9 kg)    GEN: Well nourished, well developed in no acute distress NECK: No JVD; No carotid bruits CARDIAC: RRR, no murmurs, rubs, gallops RESPIRATORY:  Clear to auscultation without rales, wheezing or rhonchi  ABDOMEN: Soft, non-tender, non-distended EXTREMITIES:  No edema; No deformity   ASSESSMENT AND PLAN CAD -episode of chest pain as outlined above, not consistent with angina.  Stable with no anginal symptoms. No indication for ischemic evaluation.  STEMI 01/2024 s/p DES to pRCA continue  Plavix  75 mg daily, nitroglycerin  as needed, Crestor  40 mg daily.  She has completed cardiac rehab but continues to do the exercises she learned during cardiac rehab.  HFpEF-NYHA class I-II, she is appears euvolemic, continue Jardiance  10 mg daily, spironolactone  25 mg daily, Lasix  as needed but usually 3 times a week, valsartan  40 mg daily.  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  Dyslipidemia with elevated LP(a) -most recent LDL elevated at 86 on 06/29/2024, following with PharmD for optimization of her lipids.        Dispo: Keep follow up with Glendia Ferrier PA ~ April 2026.   Signed, Delon JAYSON Hoover, NP

## 2024-07-15 ENCOUNTER — Telehealth: Payer: Self-pay | Admitting: Pharmacy Technician

## 2024-07-15 ENCOUNTER — Telehealth: Payer: Self-pay

## 2024-07-15 ENCOUNTER — Encounter: Payer: Self-pay | Admitting: Cardiology

## 2024-07-15 ENCOUNTER — Other Ambulatory Visit (HOSPITAL_COMMUNITY): Payer: Self-pay

## 2024-07-15 ENCOUNTER — Ambulatory Visit: Admitting: Cardiology

## 2024-07-15 ENCOUNTER — Ambulatory Visit: Attending: Cardiology

## 2024-07-15 VITALS — BP 130/68 | HR 69 | Ht 59.0 in | Wt 220.0 lb

## 2024-07-15 DIAGNOSIS — E7841 Elevated Lipoprotein(a): Secondary | ICD-10-CM

## 2024-07-15 DIAGNOSIS — E782 Mixed hyperlipidemia: Secondary | ICD-10-CM

## 2024-07-15 DIAGNOSIS — I10 Essential (primary) hypertension: Secondary | ICD-10-CM

## 2024-07-15 DIAGNOSIS — I2510 Atherosclerotic heart disease of native coronary artery without angina pectoris: Secondary | ICD-10-CM | POA: Diagnosis not present

## 2024-07-15 DIAGNOSIS — E7800 Pure hypercholesterolemia, unspecified: Secondary | ICD-10-CM

## 2024-07-15 DIAGNOSIS — I5032 Chronic diastolic (congestive) heart failure: Secondary | ICD-10-CM

## 2024-07-15 NOTE — Telephone Encounter (Signed)
 Called patient x2 to notify her of Repatha copay. VM not set up so unable to LVM. Called patient's daughter but NR - LVM requesting call back.

## 2024-07-15 NOTE — Telephone Encounter (Signed)
 Pharmacy Patient Advocate Encounter  Received notification from [employer] that Prior Authorization for Repatha has been APPROVED from 09/25/23 to 09/23/25. Ran test claim, Copay is $0.00- one month. This test claim was processed through [facility]- copay amounts may vary at other pharmacies due to pharmacy/plan contracts, or as the patient moves through the different stages of their insurance plan.   PA #/Case ID/Reference #: [id number]

## 2024-07-15 NOTE — Telephone Encounter (Signed)
 Pharmacy Patient Advocate Encounter   Received notification from Physician's Office that prior authorization for repatha is required/requested.   Insurance verification completed.   The patient is insured through [employer].   Per test claim: PA required; PA submitted to above mentioned insurance via Latent Key/confirmation #/EOC [id number] Status is pending

## 2024-07-15 NOTE — Patient Instructions (Addendum)
 Your Results:             Your most recent labs Goal  Total Cholesterol 143 < 200  Triglycerides 45 < 150  HDL (happy/good cholesterol) 47 > 40  LDL (lousy/bad cholesterol 86 < 55   Medication changes: Continue rosuvastatin  40 mg daily  We will start the process to get PCSK9i covered by your insurance.  Once the prior authorization is complete, we will call you to let you know and confirm pharmacy information.   Lab orders:We want to repeat labs after 3 months. (January 2026; week of October 19 2024)    Praluent is a cholesterol medication that improved your body's ability to get rid of bad cholesterol known as LDL. It can lower your LDL up to 60%. It is an injection that is given under the skin every 2 weeks. The most common side effects of Praluent include runny nose, symptoms of the common cold, rarely flu or flu-like symptoms, back/muscle pain in about 3-4% of the patients, and redness, pain, or bruising at the injection site.  Repatha is a cholesterol medication that improved your body's ability to get rid of bad cholesterol known as LDL. It can lower your LDL up to 60%! It is an injection that is given under the skin every 2 weeks. The medication often requires a prior authorization from your insurance company. We will take care of submitting all the necessary information to your insurance company to get it approved. The most common side effects of Repatha include runny nose, symptoms of the common cold, rarely flu or flu-like symptoms, back/muscle pain in about 3-4% of the patients, and redness, pain, or bruising at the injection site.     Copay Assistance:  The [employer] foundation offers assistance to help pay for medication copays.  They will cover copays for all cholesterol lowering meds, including statins, fibrates, omega-3 oils, ezetimibe, Repatha, Praluent, Nexletol, Nexlizet.  The cards are usually good for $2,500 or 12 months, whichever comes first. Go to  healthwellfoundation.org Click on "Apply Now" Answer questions as to whom is applying (patient or representative) Your disease fund will be "hypercholesterolemia - Medicare access" Select the cholesterol medication you need assistance with (Repatha, Praluent, Nexlizet...) They will ask question about qualifying diagnosis - you can mark yes; and do you have insurance coverage.   When they ask what type of assistance you are interested in - copay assistance When you submit, the approval is usually within minutes.  You will need to print the card information from the site You will need to show this information to your pharmacy, they will bill your Medicare Part D plan first -then bill [employer] --for the copay.   You can also call them at [phone], although the hold times can be quite long.

## 2024-07-15 NOTE — Patient Instructions (Addendum)
 Medication Instructions:  Your physician recommends that you continue on your current medications as directed. Please refer to the Current Medication list given to you today.  *If you need a refill on your cardiac medications before your next appointment, please call your pharmacy*  Lab Work: NONE ordered at this time of appointment   Testing/Procedures: NONE ordered at this time of appointment   Follow-Up: At [facility], you and your health needs are our priority.  As part of our continuing mission to provide you with exceptional heart care, our providers are all part of one team.  This team includes your primary Cardiologist (physician) and Advanced Practice Providers or APPs (Physician Assistants and Nurse Practitioners) who all work together to provide you with the care you need, when you need it.  Your next appointment:   Keep recall with Glendia Ferrier PA    We recommend signing up for the patient portal called MyChart.  Sign up information is provided on this After Visit Summary.  MyChart is used to connect with patients for Virtual Visits (Telemedicine).  Patients are able to view lab/test results, encounter notes, upcoming appointments, etc.  Non-urgent messages can be sent to your provider as well.   To learn more about what you can do with MyChart, go to [email or web address].   Other Instructions

## 2024-07-15 NOTE — Assessment & Plan Note (Addendum)
 Assessment:  LDL goal: < 55 mg/dl; last LDLc 89 mg/dl (89/7974) Tolerates rosuvastatin  well without any side effects  Discussed next potential options (PCSK-9 inhibitors, bempedoic acid and inclisiran); cost, dosing efficacy, side effects to aid in meeting LDL goal of < 55 mg/dL Patient prefers injectable therapy vs oral therapy  Heart-healthy dietary options were reviewed with the patient, and the importance of maintaining consistent physical activity was reinforced  Plan: Continue taking rosuvastatin  40 mg daily Will apply for PA for PCSK9i; will inform patient upon approval  Lipid lab due in 3 months after starting PCSK9i

## 2024-07-15 NOTE — Progress Notes (Addendum)
 Patient ID: Anne Barnes                 DOB: [birth year]                    MRN: [id number]      HPI: Anne Barnes is a 78 y.o. female patient referred to lipid clinic by Glendia Ferrier, NP. PMH is significant for HLD,  HFpEF, HTN, CAD s/p hx STEMI (01/2024) tx with DES to RCA, OSA, and IBS.   Patient presents today for PharmD lipid clinic. Of note, patient is s/p STEMI (01/2024). She recently completed cardiac rehab a couple weeks ago. She reports having a strong family support at home. She has been cleared for light physical activity and currently engages in strengthening exercises targeting her arms, shoulders, and legs. Additionally, she uses a stationary pedal for 20 minutes daily. Her daughter preparas her meals, and she is following dietary recommendations provided in resource book from cardiac rehab.   Patient's most recent lipid panel revealed LDL of 86. Given her recent MI and other cardiovascular risk factors, her LDL goal is < 55. She is currently taking rosuvastatin  40 mg daily and reports no issues on current therapy.    Reviewed options for lowering LDL cholesterol, including ezetimibe, PCSK-9 inhibitors, bempedoic acid and inclisiran.  Discussed mechanisms of action, dosing, side effects and potential decreases in LDL cholesterol.  Also reviewed cost information and potential options for patient assistance.   Patient states that she would prefer an injectable agent vs. oral.  Current Medications: rosuvastatin  40 mg daily  Intolerances: none Risk Factors: HFpEF, HTN, CAD s/p hx STEMI (01/2024), family hx of heart disease LDL goal: < 55 mg/dL  Lipid panel (89/7974): Chol 143, Trig 45, HDL 47, LDL 86  ALT (06/2024): 19; AST (04/2024): 26 and alk phos 89    Diet:  Breakfast: bowl of oatmeal and fried egg Lunch: no lunch Dinner: protein and bean casserole, or salmon, toast, blueberries  Drinks: at least 16 oz of water per day Snacks: She reports cookies and cheese are her  weakness   Exercise: Patient reports she exercises daily for 20 minutes  Strengthening exercises; arm/shoulder/leg Treadmill or stationary bike  Family History:  Relation Problem Comments  Mother (Deceased at age 33) Arthritis died of complications from hip surgery    Father (Deceased at age 70) Arthritis   Heart disease   Kidney disease   Pneumonia     Sister - 76 (Alive) Kidney disease dialysis  Obesity     Sister - 63 (Alive) Hypertension   Kidney disease dialysis    Brother (Deceased at age 73) Diabetes   Heart attack  Heart attack at 1 (premature)  Heart disease     Brother (Deceased at age 72) Alcohol abuse   Lung cancer   Throat cancer     Brother (Alive, age 74y) Pneumonia     Maternal Uncle Anxiety disorder     Maternal Grandmother (Deceased at age 72)   Maternal Grandfather (Deceased) Hypertension     Paternal Grandmother (Deceased at age 75)   Paternal Grandfather - unknown (Deceased)   Daughter - Barnie Metallurgist, age 12y) Fibromyalgia   Lupus   Rheum arthritis   Sjogren's syndrome     Daughter - Lucie 66 (Deceased) ALS   Hypertension     Daughter - Medora, 31 (Alive, age 93y) Graves' disease   Hypertension     Other Alcohol abuse parent    Son -  Bennie, 79 (Alive, age 59y)   Son - Jacques, 90 (Alive, age 24y)    Social History:  Alcohol: none Smoking: none   Labs:  Lipid Panel     Component Value Date/Time   CHOL 143 06/29/2024 1115   TRIG 45 06/29/2024 1115   TRIG 55 12/30/2009 0000   HDL 47 06/29/2024 1115   CHOLHDL 3.0 06/29/2024 1115   CHOLHDL 2.9 01/31/2024 0342   VLDL 8 01/31/2024 0342   LDLCALC 86 06/29/2024 1115   LABVLDL 10 06/29/2024 1115    Past Medical History:  Diagnosis Date   Acute combined systolic and diastolic heart failure (HCC) 05/26/2015   ALLERGIC RHINITIS    Allergic urticaria 08/01/2014   Patient reports allergies to shrimp, egg whites, tomatoes, dairy    Allergy     seasonal and numerous food and  drug allergies.   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   Office Spirometry 11/07/17-WNL-FVC 1.58/82%, FEV1 1.27/85%, ratio 0.80, FEF 25-75% 0.26/89%   Bradycardia 02/25/2017   CAD (coronary artery disease) 05/01/2023   Myoview  03/26/2017: No ischemia Non-obs dz - CCTA 10/31/2019: CAC score 127; RCA proximal and mid 25-49, LAD proximal and mid 0-25    Chiari malformation    Noted MRI brain 09/2013 - s/p neuro eval for same   DIVERTICULOSIS, COLON    Essential hypertension 08/28/2010   Qualifier: Diagnosis of  By: Wilhemina RMA, Lucy      Frequent headaches    GERD    H/O measles    H/O mumps    Hearing loss 01/18/2017   History of chicken pox    Hyperglycemia 03/05/2016   Hyperlipidemia    HYPERTENSION    Hypocalcemia 02/25/2017   IBS (irritable bowel syndrome) 05/26/2015   Nonspecific abnormal electrocardiogram (ECG) (EKG)    OBSTRUCTIVE SLEEP APNEA 12/2008 dx   noncompliant with CPAP qhs   Obstructive sleep apnea 08/28/2010   NPSG 12/2008:  AHI 13/hr with desats to 78%    Preventative health care 06/11/2017   Sciatica    right side   Seasonal and perennial allergic rhinitis 08/28/2010   Allergy  profile 03/11/14- positive especially for dust, cat and dog Food allergy  profile- total IgE 86 with several food group elevations Sed rate-WNL      Sinusitis 06/06/2017   Sleep apnea    uses cpap   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 Take with full glass of water on an empty stomach 12 tablet 3   ALPRAZolam  (XANAX ) 0.5 MG tablet TAKE 1 TABLET BY MOUTH TWICE DAILY AS NEEDED FOR ANXIETY AND FOR SLEEP 20  tablet 0   amLODipine  (NORVASC ) 2.5 MG tablet Take 1 tablet (2.5 mg total) by mouth daily. (Patient not taking: Reported on 07/15/2024) 30 tablet 3   aspirin  EC 81 MG tablet Take 1 tablet (81 mg total) by mouth daily. Swallow whole. 30 tablet 3   azelastine  (ASTELIN ) 0.1 % nasal spray Place 2 sprays into both nostrils 2 (two) times daily as needed for rhinitis. (Patient not taking: Reported on 07/15/2024) 30 mL 5   azelastine  (OPTIVAR )  0.05 % ophthalmic solution Place 1 drop into both eyes 2 (two) times daily as needed. 6 mL 5   b complex vitamins tablet Take 1 tablet by mouth daily.     Cholecalciferol (VITAMIN D ) 50 MCG (2000 UT) tablet Take 2,000 Units by mouth daily.     clopidogrel  (PLAVIX ) 75 MG tablet Take 1 tablet (75 mg total) by mouth daily with breakfast. 90 tablet 3   empagliflozin  (JARDIANCE ) 10 MG TABS tablet Take 1 tablet (10 mg total) by mouth daily before breakfast. 90 tablet 3   EPINEPHrine  0.3 mg/0.3 mL IJ SOAJ injection Inject 0.3 mg into the muscle as needed for anaphylaxis. 2 each 2   fluticasone  (FLONASE ) 50 MCG/ACT nasal spray Use 1-2 sprays in each nostril once  a day as needed for stuffy nose. In the right nostril, point the applicator out toward the right ear. In the left nostril, point the applicator out toward the left ear 16 g 5   furosemide  (LASIX ) 20 MG tablet Take 1 tablet (20 mg total) by mouth 3 (three) times a week. Monday, Wednesday and Friday. 90 tablet 3   levocetirizine (XYZAL ) 5 MG tablet Take 1 tablet (5 mg total) by mouth every evening. 30 tablet 5   MAGNESIUM  CITRATE PO Take 1 tablet by mouth daily.     montelukast  (SINGULAIR ) 10 MG tablet Take 1 tablet (10 mg total) by mouth at bedtime. 30 tablet 5   Multiple Vitamin (MULTIVITAMIN) tablet Take 1 tablet by mouth daily.     nitroGLYCERIN  (NITROSTAT ) 0.4 MG SL tablet DISSOLVE ONE TABLET UNDER THE TONGUE EVERY 5 MINUTES AS NEEDED FOR CHEST PAIN.  DO NOT EXCEED A TOTAL OF 3 DOSES IN 15 MINUTES 25 tablet 0    pantoprazole  (PROTONIX ) 40 MG tablet Take 1 tablet (40 mg total) by mouth daily. 90 tablet 1   Pseudoephedrine-Ibuprofen (ADVIL COLD/SINUS PO) Take 2 tablets by mouth as needed.     rosuvastatin  (CRESTOR ) 40 MG tablet Take 1 tablet (40 mg total) by mouth daily. 90 tablet 3   spironolactone  (ALDACTONE ) 25 MG tablet Take 1 tablet (25 mg total) by mouth daily. 90 tablet 2   UNABLE TO FIND Take 2 capsules by mouth daily. Fish Oil w/ tumeric     valsartan  (DIOVAN ) 40 MG tablet Take 1 tablet (40 mg total) by mouth daily. 90 tablet 3   No current facility-administered medications on file prior to visit.    Allergies  Allergen Reactions   Codeine Nausea And Vomiting   Hydralazine  Hcl Other (See Comments)    Pt reports causes dizziness   Lisinopril-Hydrochlorothiazide Hives    REACTION: lip swelling,facial swelling,rash.   Metoprolol  Other (See Comments)    Does not feel well on it    Other Cough    Patient states she is allergic to Shrimp.  Says they make her feel unwell but do not cause hives, SOB or anaphylaxis.  She just avoids eating them because they can make her have a headache.  Strawberries, watermelon, peanuts, and all shellfish.    environmental   Shrimp [Shellfish Allergy ] Other (See Comments)    Was allergy  tested and showed shrimp was one--has not reacted b/c avoided    Assessment/Plan:  1. Hyperlipidemia -  Problem  Hyperlipidemia   Hyperlipidemia Assessment:  LDL goal: < 55 mg/dl; last LDLc 89 mg/dl (89/7974) Tolerates rosuvastatin  well without any side effects  Discussed next potential options (PCSK-9 inhibitors, bempedoic acid and inclisiran); cost, dosing efficacy, side effects to aid  in meeting LDL goal of < 55 mg/dL Patient prefers injectable therapy vs oral therapy  Heart-healthy dietary options were reviewed with the patient, and the importance of maintaining consistent physical activity was reinforced  Plan: Continue taking rosuvastatin  40 mg daily Will apply  for PA for PCSK9i; will inform patient upon approval  Lipid lab due in 3 months after starting PCSK9i    Thank you,  Gurnoor Sloop E. Zuleima Haser, Pharm.D [place] [facility]. [facility] [address], [place], [place] [address] Phone: [phone]; Fax: [phone]

## 2024-07-16 MED ORDER — REPATHA SURECLICK 140 MG/ML ~~LOC~~ SOAJ: 140.0000 mg | SUBCUTANEOUS | 2 refills | Status: DC

## 2024-07-16 NOTE — Telephone Encounter (Signed)
 Called patient to notify of Repatha copay and confirm preferred pharmacy. Rx sent. Instructed patient to obtain f/u lipid panel ~ 3 months after starting new therapy. Patient verbalized understanding.

## 2024-07-16 NOTE — Addendum Note (Signed)
 Addended by: Kiaja Shorty E on: 07/16/2024 01:10 PM   Modules accepted: Orders

## 2024-07-16 NOTE — Telephone Encounter (Signed)
 Please see other encounter.

## 2024-07-17 ENCOUNTER — Ambulatory Visit

## 2024-07-17 DIAGNOSIS — J309 Allergic rhinitis, unspecified: Secondary | ICD-10-CM | POA: Diagnosis not present

## 2024-07-24 ENCOUNTER — Ambulatory Visit (INDEPENDENT_AMBULATORY_CARE_PROVIDER_SITE_OTHER): Admitting: *Deleted

## 2024-07-24 DIAGNOSIS — J309 Allergic rhinitis, unspecified: Secondary | ICD-10-CM

## 2024-07-24 DIAGNOSIS — J302 Other seasonal allergic rhinitis: Secondary | ICD-10-CM | POA: Diagnosis not present

## 2024-07-25 DIAGNOSIS — I2510 Atherosclerotic heart disease of native coronary artery without angina pectoris: Secondary | ICD-10-CM | POA: Diagnosis not present

## 2024-07-25 DIAGNOSIS — E785 Hyperlipidemia, unspecified: Secondary | ICD-10-CM | POA: Diagnosis not present

## 2024-07-25 DIAGNOSIS — Z7902 Long term (current) use of antithrombotics/antiplatelets: Secondary | ICD-10-CM | POA: Diagnosis not present

## 2024-07-25 DIAGNOSIS — E1122 Type 2 diabetes mellitus with diabetic chronic kidney disease: Secondary | ICD-10-CM | POA: Diagnosis not present

## 2024-07-25 DIAGNOSIS — Z7984 Long term (current) use of oral hypoglycemic drugs: Secondary | ICD-10-CM | POA: Diagnosis not present

## 2024-07-25 DIAGNOSIS — J45909 Unspecified asthma, uncomplicated: Secondary | ICD-10-CM | POA: Diagnosis not present

## 2024-07-25 DIAGNOSIS — Z87892 Personal history of anaphylaxis: Secondary | ICD-10-CM | POA: Diagnosis not present

## 2024-07-25 DIAGNOSIS — N1831 Chronic kidney disease, stage 3a: Secondary | ICD-10-CM | POA: Diagnosis not present

## 2024-07-25 DIAGNOSIS — I130 Hypertensive heart and chronic kidney disease with heart failure and stage 1 through stage 4 chronic kidney disease, or unspecified chronic kidney disease: Secondary | ICD-10-CM | POA: Diagnosis not present

## 2024-07-25 DIAGNOSIS — Z7982 Long term (current) use of aspirin: Secondary | ICD-10-CM | POA: Diagnosis not present

## 2024-07-25 DIAGNOSIS — Z79899 Other long term (current) drug therapy: Secondary | ICD-10-CM | POA: Diagnosis not present

## 2024-07-25 DIAGNOSIS — I252 Old myocardial infarction: Secondary | ICD-10-CM | POA: Diagnosis not present

## 2024-07-25 DIAGNOSIS — E1142 Type 2 diabetes mellitus with diabetic polyneuropathy: Secondary | ICD-10-CM | POA: Diagnosis not present

## 2024-07-25 DIAGNOSIS — M810 Age-related osteoporosis without current pathological fracture: Secondary | ICD-10-CM | POA: Diagnosis not present

## 2024-07-25 DIAGNOSIS — K219 Gastro-esophageal reflux disease without esophagitis: Secondary | ICD-10-CM | POA: Diagnosis not present

## 2024-08-07 ENCOUNTER — Ambulatory Visit (INDEPENDENT_AMBULATORY_CARE_PROVIDER_SITE_OTHER)

## 2024-08-07 DIAGNOSIS — J3089 Other allergic rhinitis: Secondary | ICD-10-CM | POA: Diagnosis not present

## 2024-08-07 DIAGNOSIS — J309 Allergic rhinitis, unspecified: Secondary | ICD-10-CM | POA: Diagnosis not present

## 2024-08-25 ENCOUNTER — Emergency Department (HOSPITAL_COMMUNITY)

## 2024-08-25 ENCOUNTER — Other Ambulatory Visit: Payer: Self-pay

## 2024-08-25 ENCOUNTER — Emergency Department (HOSPITAL_COMMUNITY): Admission: EM | Admit: 2024-08-25 | Discharge: 2024-08-25 | Disposition: A | Discharge status: Home / Self Care

## 2024-08-25 DIAGNOSIS — G44229 Chronic tension-type headache, not intractable: Secondary | ICD-10-CM | POA: Insufficient documentation

## 2024-08-25 DIAGNOSIS — G935 Compression of brain: Secondary | ICD-10-CM | POA: Insufficient documentation

## 2024-08-25 DIAGNOSIS — R42 Dizziness and giddiness: Secondary | ICD-10-CM | POA: Diagnosis not present

## 2024-08-25 DIAGNOSIS — G44209 Tension-type headache, unspecified, not intractable: Secondary | ICD-10-CM | POA: Diagnosis not present

## 2024-08-25 DIAGNOSIS — R519 Headache, unspecified: Secondary | ICD-10-CM | POA: Diagnosis present

## 2024-08-25 DIAGNOSIS — Z7982 Long term (current) use of aspirin: Secondary | ICD-10-CM | POA: Insufficient documentation

## 2024-08-25 LAB — BASIC METABOLIC PANEL WITH GFR
Anion gap: 5 (ref 5–15)
BUN: 23 mg/dL (ref 8–23)
CO2: 29 mmol/L (ref 22–32)
Calcium: 8.8 mg/dL — ABNORMAL LOW (ref 8.9–10.3)
Chloride: 104 mmol/L (ref 98–111)
Creatinine, Ser: 0.9 mg/dL (ref 0.44–1.00)
GFR, Estimated: >60 mL/min (ref >60)
Glucose, Bld: 92 mg/dL (ref 70–99)
Potassium: 3.9 mmol/L (ref 3.5–5.1)
Sodium: 138 mmol/L (ref 135–145)

## 2024-08-25 LAB — CBC WITH DIFFERENTIAL/PLATELET
Abs Immature Granulocytes: 0.02 K/uL (ref 0.00–0.07)
Basophils Absolute: 0.1 K/uL (ref 0.0–0.1)
Basophils Relative: 1 %
Eosinophils Absolute: 0.4 K/uL (ref 0.0–0.5)
Eosinophils Relative: 5 %
HCT: 44.9 % (ref 36.0–46.0)
Hemoglobin: 14.1 g/dL (ref 12.0–15.0)
Immature Granulocytes: 0 %
Lymphocytes Relative: 22 %
Lymphs Abs: 2 K/uL (ref 0.7–4.0)
MCH: 29.1 pg (ref 26.0–34.0)
MCHC: 31.4 g/dL (ref 30.0–36.0)
MCV: 92.8 fL (ref 80.0–100.0)
Monocytes Absolute: 0.8 K/uL (ref 0.1–1.0)
Monocytes Relative: 9 %
Neutro Abs: 5.7 K/uL (ref 1.7–7.7)
Neutrophils Relative %: 63 %
Platelets: 246 K/uL (ref 150–400)
RBC: 4.84 MIL/uL (ref 3.87–5.11)
RDW: 13.2 % (ref 11.5–15.5)
WBC: 9 K/uL (ref 4.0–10.5)
nRBC: 0 % (ref 0.0–0.2)

## 2024-08-25 LAB — URINALYSIS, ROUTINE W REFLEX MICROSCOPIC
Bilirubin Urine: NEGATIVE
Glucose, UA: >=500 mg/dL — AB
Hgb urine dipstick: NEGATIVE
Ketones, ur: NEGATIVE mg/dL
Nitrite: NEGATIVE
Protein, ur: NEGATIVE mg/dL
Specific Gravity, Urine: 1.02 (ref 1.005–1.030)
pH: 6 (ref 5.0–8.0)

## 2024-08-25 LAB — URINALYSIS, MICROSCOPIC (REFLEX)

## 2024-08-25 MED ORDER — SODIUM CHLORIDE 0.9 % IV BOLUS: 500.0000 mL | Freq: Once | INTRAVENOUS | Status: DC

## 2024-08-25 MED ORDER — MECLIZINE HCL 25 MG PO TABS
25.0000 mg | ORAL_TABLET | Freq: Once | ORAL | Status: AC
  Administered 2024-08-25: 25 mg via ORAL
  Filled 2024-08-25: qty 1

## 2024-08-25 MED ORDER — ACETAMINOPHEN 500 MG PO TABS
1000.0000 mg | ORAL_TABLET | Freq: Once | ORAL | Status: AC
  Administered 2024-08-25: 1000 mg via ORAL
  Filled 2024-08-25: qty 2

## 2024-08-25 NOTE — ED Provider Notes (Signed)
 [place] EMERGENCY DEPARTMENT AT [facility] Provider Note   CSN: [id number] Arrival date & time: 08/25/24  1014     Patient presents with: Headache and Tinnitus   Anne Barnes is a 78 y.o. female.    Headache   Patient presents because of headache.  Chronic history of frontal headaches.  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  Patient states that she has been having a headache over the past day or so.  No fever no chills.  No neck pain.  No pain with any kind of flexion of the neck.  No acute vision changes.  Denies any diplopia.  Patient states that sometimes she becomes vertiginous.  Had a hard time walking today because of vertigo.  Patient states that sometimes this happens randomly.  She also attributes to her sinuses and sinus pressure.  No diplopia.  No numbness or tingling aware.  No recent falls.     Prior to Admission medications   Medication Sig Start Date End Date Taking? Authorizing Provider  albuterol  (VENTOLIN  HFA) 108 (90 Base) MCG/ACT inhaler INHALE 2 PUFFS BY MOUTH EVERY 4 TO 6 HOURS AS NEEDED FOR COUGH, FOR WHEEZING, SHORTNESS OF BREATH/CHEST 06/03/24   Jeneal Danita Macintosh, MD  alendronate  (FOSAMAX ) 70 MG tablet Take 1 tablet (70 mg total) by mouth once a week. Take with full glass of water on an empty stomach 03/26/24   Domenica Harlene DELENA, MD  ALPRAZolam  (XANAX ) 0.5 MG tablet TAKE 1 TABLET BY MOUTH TWICE DAILY AS NEEDED FOR ANXIETY AND FOR SLEEP 06/19/24   Webb, Padonda B, FNP  amLODipine  (NORVASC ) 2.5 MG tablet Take 1 tablet (2.5 mg total) by mouth daily. Patient not taking: Reported on 07/15/2024 09/24/23   Domenica Harlene DELENA, MD  aspirin  EC 81 MG tablet Take 1 tablet (81 mg total) by mouth daily. Swallow whole. 01/31/24   Williams, Evan, PA-C  azelastine  (ASTELIN ) 0.1 % nasal spray Place 2 sprays into both nostrils 2 (two) times daily as needed for rhinitis. Patient not taking: Reported on 07/15/2024  05/31/23   Jeneal Danita Macintosh, MD  azelastine  (OPTIVAR ) 0.05 % ophthalmic solution Place 1 drop into both eyes 2 (two) times daily as needed. 03/18/24   Jeneal Danita Macintosh, MD  b complex vitamins tablet Take 1 tablet by mouth daily.    [provider]  Cholecalciferol (VITAMIN D ) 50 MCG (2000 UT) tablet Take 2,000 Units by mouth daily.    [provider]  clopidogrel  (PLAVIX ) 75 MG tablet Take 1 tablet (75 mg total) by mouth daily with breakfast. 02/26/24   Santo Stanly DELENA, MD  empagliflozin  (JARDIANCE ) 10 MG TABS tablet Take 1 tablet (10 mg total) by mouth daily before breakfast. 02/12/24   Lelon Hamilton T, PA-C  EPINEPHrine  0.3 mg/0.3 mL IJ SOAJ injection Inject 0.3 mg into the muscle as needed for anaphylaxis. 01/24/24   Jeneal Danita Macintosh, MD  Evolocumab  (REPATHA  SURECLICK) 140 MG/ML SOAJ Inject 140 mg into the skin every 14 (fourteen) days. 07/16/24   Chandrasekhar, Stanly DELENA, MD  fluticasone  (FLONASE ) 50 MCG/ACT nasal spray Use 1-2 sprays in each nostril once  a day as needed for stuffy nose. In the right nostril, point the applicator out toward the right ear. In the left nostril, point the applicator out toward the left ear 06/26/24   Jeneal Danita Macintosh, MD  furosemide  (LASIX ) 20 MG tablet Take 1 tablet (20 mg total) by mouth 3 (three) times  a week. Monday, Wednesday and Friday. 04/28/21   Lelon Hamilton T, PA-C  levocetirizine (XYZAL ) 5 MG tablet Take 1 tablet (5 mg total) by mouth every evening. 03/18/24   Jeneal Danita Macintosh, MD  MAGNESIUM  CITRATE PO Take 1 tablet by mouth daily.    [provider]  montelukast  (SINGULAIR ) 10 MG tablet Take 1 tablet (10 mg total) by mouth at bedtime. 03/18/24   Jeneal Danita Macintosh, MD  Multiple Vitamin (MULTIVITAMIN) tablet Take 1 tablet by mouth daily.    [provider]  nitroGLYCERIN  (NITROSTAT ) 0.4 MG SL tablet DISSOLVE ONE TABLET UNDER THE TONGUE EVERY 5 MINUTES AS NEEDED FOR CHEST  PAIN.  DO NOT EXCEED A TOTAL OF 3 DOSES IN 15 MINUTES 06/18/24   Lelon Hamilton T, PA-C  pantoprazole  (PROTONIX ) 40 MG tablet Take 1 tablet (40 mg total) by mouth daily. 04/14/24   Yacopino, Jessica L, NP  Pseudoephedrine-Ibuprofen (ADVIL COLD/SINUS PO) Take 2 tablets by mouth as needed.    [provider]  rosuvastatin  (CRESTOR ) 40 MG tablet Take 1 tablet (40 mg total) by mouth daily. 05/16/24 08/14/24  Lelon Hamilton T, PA-C  spironolactone  (ALDACTONE ) 25 MG tablet Take 1 tablet (25 mg total) by mouth daily. 10/25/23   Weaver, Scott T, PA-C  UNABLE TO FIND Take 2 capsules by mouth daily. Fish Oil w/ tumeric    [provider]  valsartan  (DIOVAN ) 40 MG tablet Take 1 tablet (40 mg total) by mouth daily. 03/13/24   Lelon Hamilton T, PA-C    Allergies: Codeine, Hydralazine  hcl, Lisinopril-hydrochlorothiazide, Metoprolol , Other, and Shrimp [shellfish allergy ]    Review of Systems  Neurological:  Positive for headaches.    Updated Vital Signs BP (!) 139/57 (BP Location: Left Arm)   Pulse 67   Temp 97.9 F (36.6 C) (Oral)   Resp 16   Ht 4' 11 (1.499 m)   Wt 99.8 kg   SpO2 100%   BMI 44.43 kg/m   Physical Exam Vitals and nursing note reviewed.  Constitutional:      General: She is not in acute distress.    Appearance: She is well-developed.  HENT:     Head: Normocephalic and atraumatic.  Eyes:     Conjunctiva/sclera: Conjunctivae normal.  Cardiovascular:     Rate and Rhythm: Normal rate and regular rhythm.     Heart sounds: No murmur heard. Pulmonary:     Effort: Pulmonary effort is normal. No respiratory distress.     Breath sounds: Normal breath sounds.  Abdominal:     Palpations: Abdomen is soft.     Tenderness: There is no abdominal tenderness.  Musculoskeletal:        General: No swelling.     Cervical back: Neck supple.  Skin:    General: Skin is warm and dry.     Capillary Refill: Capillary refill takes less than 2 seconds.  Neurological:     Mental  Status: She is alert.  Psychiatric:        Mood and Affect: Mood normal.     (all labs ordered are listed, but only abnormal results are displayed) Labs Reviewed  BASIC METABOLIC PANEL WITH GFR - Abnormal; Notable for the following components:      Result Value   Calcium  8.8 (*)    All other components within normal limits  URINALYSIS, ROUTINE W REFLEX MICROSCOPIC - Abnormal; Notable for the following components:   APPearance HAZY (*)    Glucose, UA >=500 (*)    Leukocytes,Ua SMALL (*)  All other components within normal limits  URINALYSIS, MICROSCOPIC (REFLEX) - Abnormal; Notable for the following components:   Bacteria, UA MANY (*)    All other components within normal limits  CBC WITH DIFFERENTIAL/PLATELET    EKG: None  Radiology: MR BRAIN WO CONTRAST Result Date: 08/25/2024 CLINICAL DATA:  Evaluate for posterior circulation CVA EXAM: MRI HEAD WITHOUT CONTRAST TECHNIQUE: Multiplanar, multiecho pulse sequences of the brain and surrounding structures were obtained without intravenous contrast. 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 There is some increased cord signal in the cervicomedullary junction There are several foci of T2 hyperintensity in the cerebral white matter. These do not have restricted diffusion. No acute infarct. The ventricles are normal. No mass lesion. There are normal flow signals in the carotid arteries and basilar artery. No significant bone marrow signal abnormality. No significant abnormality in the paranasal sinuses or soft tissues. IMPRESSION: 1. No acute infarct 2. 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 There is abnormal signal in the cord at the cervicomedullary junction. Electronically Signed   By: Nancyann Burns M.D.   On: 08/25/2024 13:52     Procedures   Medications Ordered in the ED  sodium chloride  0.9 % bolus 500 mL (has no  administration in time range)  meclizine (ANTIVERT) tablet 25 mg (25 mg Oral Given 08/25/24 1623)  acetaminophen  (TYLENOL ) tablet 1,000 mg (1,000 mg Oral Given 08/25/24 1623)                                    Medical Decision Making Amount and/or Complexity of Data Reviewed Labs: ordered. Radiology: ordered.  Risk OTC drugs.     HPI:   Patient presents because of headache.  Chronic history of frontal headaches.  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  Patient states that she has been having a headache over the past day or so.  No fever no chills.  No neck pain.  No pain with any kind of flexion of the neck.  No acute vision changes.  Denies any diplopia.  Patient states that sometimes she becomes vertiginous.  Had a hard time walking today because of vertigo.  Patient states that sometimes this happens randomly.  She also attributes to her sinuses and sinus pressure.  No diplopia.  No numbness or tingling aware.  No recent falls.     MDM:   Upon exam, patient ANO x 3 with GCS 15.  Cranials 2 through 12 intact.  Strength and sensation intact in bilateral upper and lower extremities.  Normal finger-nose.  Normal Romberg.  Somewhat difficult with ambulation and straight line.  She has risk factors so we will go ahead and obtain MRI brain to rule out count of posterior CVA though I think this is unlikely.  I think this is more likely peripheral vertigo given her recurrent episodes that seems to be a little bit worse today.  She has been suffering from this for the past 5 years.  No concerns for a giant cell temporal arteritis at this time.  No unilateral pain over the temporal artery.  No vision changes at this time.  No diplopia.  No concerns anytime spontaneous bleed.  No subdural epidural symptoms.  No signs for infection at this time.  Will give patient meclizine, bolus of 500 cc fluid as well as Tylenol  for the  headache.     Reevaluation:   Upon reexamination, patient hemodynamically stable.  Remains A&O x 3 with GCS 15.   Spoke to Porter PA from Neurosurgery group. Given chronicity of symptoms, patient can follow up outpatient. Could explain her headaches over the past five years.   I discussed with the patient.  She understands and will follow-up with neurosurgery.  Able to walk around at this point time.  Was able to walk to the bathroom.  CVA workup negative.  No concerns for arrhythmia.  Patient to be discharged with neurosurgery follow-up.    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

## 2024-08-25 NOTE — ED Notes (Signed)
 Patient transported to MRI

## 2024-08-25 NOTE — ED Notes (Signed)
 Ambulated patient to the bathroom, minimal 1 person assist

## 2024-08-25 NOTE — ED Notes (Signed)
 Pt requesting assistance with going to bathroom - RN and NT notified

## 2024-08-25 NOTE — ED Triage Notes (Signed)
 Pt. Stated, I have tinnitus, along with headache for 6 months. I go to my Dr. And they say its allergies. Its making me feel dizzy and then it will pass.

## 2024-08-25 NOTE — Discharge Instructions (Addendum)
 Please follow up with neurosurgery. You need to discuss your Chiari malformation.   If you have worsening headaches, vision changes, difficulty walking then come back to the ED.   You can take your typical pain medication.   If anything changes, please come back to the ED.   It is very important that you do follow up with neurosurgery.

## 2024-09-02 ENCOUNTER — Other Ambulatory Visit: Payer: Self-pay | Admitting: Neurosurgery

## 2024-09-02 DIAGNOSIS — G935 Compression of brain: Secondary | ICD-10-CM | POA: Diagnosis not present

## 2024-09-02 DIAGNOSIS — Z6841 Body Mass Index (BMI) 40.0 and over, adult: Secondary | ICD-10-CM | POA: Diagnosis not present

## 2024-09-03 ENCOUNTER — Encounter: Payer: Self-pay | Admitting: Neurosurgery

## 2024-09-08 ENCOUNTER — Ambulatory Visit

## 2024-09-08 DIAGNOSIS — J309 Allergic rhinitis, unspecified: Secondary | ICD-10-CM

## 2024-09-08 DIAGNOSIS — J302 Other seasonal allergic rhinitis: Secondary | ICD-10-CM | POA: Diagnosis not present

## 2024-09-18 ENCOUNTER — Other Ambulatory Visit: Payer: Self-pay | Admitting: Family

## 2024-09-18 ENCOUNTER — Other Ambulatory Visit: Payer: Self-pay | Admitting: Allergy

## 2024-09-21 ENCOUNTER — Ambulatory Visit

## 2024-09-21 DIAGNOSIS — J302 Other seasonal allergic rhinitis: Secondary | ICD-10-CM

## 2024-09-21 DIAGNOSIS — J309 Allergic rhinitis, unspecified: Secondary | ICD-10-CM

## 2024-09-21 NOTE — Telephone Encounter (Signed)
 Requesting: alprazolam  0.5mg  Contract: None UDS: None Last Visit: 04/14/24 w/ Harlene GRADE Next Visit: 10/20/24 w/ Harlene GRADE Last Refill: 06/19/24 #20 and 0RF   Please Advise

## 2024-09-25 ENCOUNTER — Ambulatory Visit: Admitting: Allergy

## 2024-09-27 ENCOUNTER — Other Ambulatory Visit

## 2024-09-28 ENCOUNTER — Encounter: Payer: Self-pay | Admitting: Internal Medicine

## 2024-09-28 ENCOUNTER — Ambulatory Visit: Admitting: Internal Medicine

## 2024-09-28 ENCOUNTER — Other Ambulatory Visit: Payer: Self-pay

## 2024-09-28 VITALS — BP 130/80 | HR 71 | Temp 97.9°F | Ht <= 58 in | Wt 217.6 lb

## 2024-09-28 DIAGNOSIS — T7800XD Anaphylactic reaction due to unspecified food, subsequent encounter: Secondary | ICD-10-CM | POA: Diagnosis not present

## 2024-09-28 DIAGNOSIS — T7800XA Anaphylactic reaction due to unspecified food, initial encounter: Secondary | ICD-10-CM

## 2024-09-28 DIAGNOSIS — J302 Other seasonal allergic rhinitis: Secondary | ICD-10-CM

## 2024-09-28 DIAGNOSIS — J3089 Other allergic rhinitis: Secondary | ICD-10-CM

## 2024-09-28 DIAGNOSIS — G4733 Obstructive sleep apnea (adult) (pediatric): Secondary | ICD-10-CM | POA: Diagnosis not present

## 2024-09-28 DIAGNOSIS — J4520 Mild intermittent asthma, uncomplicated: Secondary | ICD-10-CM

## 2024-09-28 MED ORDER — AZELASTINE HCL 0.05 % OP SOLN: 1.0000 [drp] | Freq: Two times a day (BID) | OPHTHALMIC | 5 refills | Status: AC | PRN

## 2024-09-28 MED ORDER — LORATADINE 10 MG PO TABS: 10.0000 mg | ORAL_TABLET | Freq: Every day | ORAL | 5 refills | Status: AC

## 2024-09-28 MED ORDER — FLUTICASONE PROPIONATE 50 MCG/ACT NA SUSP: NASAL | 5 refills | Status: AC

## 2024-09-28 MED ORDER — MONTELUKAST SODIUM 10 MG PO TABS: 10.0000 mg | ORAL_TABLET | Freq: Every day | ORAL | 5 refills | Status: AC

## 2024-09-28 MED ORDER — AZELASTINE HCL 0.1 % NA SOLN: 2.0000 | Freq: Two times a day (BID) | NASAL | 5 refills | Status: AC | PRN

## 2024-09-28 MED ORDER — SALINE SPRAY 0.65 % NA SOLN: 1.0000 | NASAL | 3 refills | Status: AC | PRN

## 2024-09-28 MED ORDER — AYR SALINE NASAL NA GEL: 1.0000 | Freq: Every day | NASAL | 5 refills | Status: AC

## 2024-09-28 MED ORDER — ALBUTEROL SULFATE HFA 108 (90 BASE) MCG/ACT IN AERS: INHALATION_SPRAY | RESPIRATORY_TRACT | 0 refills | Status: AC

## 2024-09-28 NOTE — Patient Instructions (Addendum)
 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   - start nasal saline spray or gel at least twice daily, use saline gel prior to CPAP use - Use flonase  2 sprays daily - follow this with azelastine  2 sprays up to twice daily as needed - use saline spray or gel prior to flonase  and azelastine  - continue claritin  10 mg daily - continue Singulair  10mg  daily - use Optivar  1 drop each eye up to twice a day as needed for itchy/watery/red eyes.  - continue allergy  injections per schedule and have access to epipen .   Food allergy  - continue food avoidance of shellfish, peanut   - will obtain serum IgE levels for these foods.   - have access to your epinephrine  device 0.3mg  in case of allergic reaction.   - follow emergency action plan in case of allergic reaction   Mild intermittent asthma - have access to albuterol  inhaler 2 puffs every 4-6 hours as needed for cough/wheeze/shortness of breath/chest tightness.  May use 15-20 minutes prior to activity.   Monitor frequency of use.    Follow-up in 3 months or sooner if needed It was a pleasure meeting you in clinic today! Thank you for allowing me to participate in your care.  Rocky Endow, MD [facility]

## 2024-09-28 NOTE — Progress Notes (Signed)
 "  FOLLOW UP Date of Service/Encounter:   09/28/2024  Subjective:  Anne Barnes (DOB: May 14, 1946) is a 79 y.o. female who [facility] in re-evaluation of the following: allergic rhinitis on AIT, food allergy , mild intermittent asthma History obtained from: chart review and patient.  For Review, LV was on 03/18/24  with Dr. Jeneal seen for routine follow-up. This is my first encouter with her.  See below for summary of history and diagnostics.   ----------------------------------------------------- Pertinent History/Diagnostics:  Asthma: Mild intermittent  Allergic Rhinitis:  has been receiving allergy  shots since 2021 and is in the maintenance phase. Her vial includes mite and pet dander -Environmental allergy  panel shows 03/18/24 mildly High Level to cat dander, dog dander; low to moderate IgE levels for dust mites; very low IgE to rodent and cockroach. When comparing these levels to her past allergy  testing the dust mite levels are lower now, cat is lower, dog is much lower.  Food Allergy :  Avoids peanuts, shrimp and intolerant to gluten and soy.  - Shellfish IgE panel shows very low IgE to shrimp - Nut IgE panel shows moderate IgE levels to peanut  and low IgE levels to hazelnut, walnut, cashew, pistachio Other: experienced a heart attack in the beginning of Ma  2025 --------------------------------------------------- Today presents for follow-up. Discussed the use of AI scribe software for clinical note transcription with the patient, who gave verbal consent to proceed.  History of Present Illness Anne Barnes is a 79 year old female who presents with mucus buildup and pressure.  Sinonasal congestion and pressure - Significant mucus buildup, primarily on the left side - Pressure localized to left and right cheek, forehead, and neck - Pressure is persistent and builds up over time - Episodes last a few weeks, followed by periods of  relief - Pressure leads to coughing and dizziness, with near falls if not seated - Occasional bloody mucus when blowing nose, attributed to irritation and dryness - this has been ongoing for years and is not worse at the moment - 2014 CT sinus clear paranasal sinuses read  Allergy  management and medication use - Uses Flonase  nasal spray - Takes Claritin  in the morning and Singulair  at night - Receives allergy  shots - Previously used nasal saline rinses, discontinued due to severe burning sensations - No antibiotic use for current symptoms  Cpap use and associated symptoms - Suspects CPAP machine contributes to increased mucus production - Increased mucus production with CPAP use - Attempted discontinuation of CPAP for two weeks to assess symptom improvement - Attributes nasal irritation and bloody mucus to CPAP and dryness  Has not required rescue inhaler often. Breathing controlled  Allergen avoidance - Avoids shellfish, peanuts, and gluten, occasionally eats gluten - Avoids exposure to dogs and cats  Symptom trajectory - Symptoms have not worsened over time but remain recurrent   All medications reviewed by clinical staff and updated in chart. No new pertinent medical or surgical history except as noted in HPI.  ROS: All others negative except as noted per HPI.   Objective:  BP 130/80 (BP Location: Right Arm, Patient Position: Sitting, Cuff Size: Normal)   Pulse 71   Temp 97.9 F (36.6 C) (Temporal)   Ht 4' 8.5 (1.435 m)   Wt 217 lb 9.6 oz (98.7 kg)   SpO2 98%   BMI 47.93 kg/m  Body mass index is 47.93 kg/m. Physical Exam: General Appearance:  Alert, cooperative, no distress, appears stated age  Head:  Normocephalic, without obvious abnormality, atraumatic  Eyes:  Conjunctiva clear, EOM's intact  Ears EACs normal bilaterally and normal TMs bilaterally  Nose: Nares normal, normal mucosa and no visible anterior polyps  Throat: Lips, tongue normal; teeth and gums  normal, normal posterior oropharynx  Neck: Supple, symmetrical  Lungs:   clear to auscultation bilaterally, Respirations unlabored, no coughing  Heart:  regular rate and rhythm and no murmur, Appears well perfused  Extremities: No edema  Skin: Skin color, texture, turgor normal and no rashes or lesions on visualized portions of skin  Neurologic: No gross deficits   Labs:  Lab Orders  No laboratory test(s) ordered today    Spirometry:  Tracings reviewed. Her effort: Good reproducible efforts. FVC: 1.60L FEV1: 1.31L, 83% predicted FEV1/FVC ratio: 0.82 Interpretation: Spirometry consistent with normal pattern.  Please see scanned spirometry results for details.  Assessment/Plan   Chronic rhinosinusitis - with worsening symptoms, continue AIT for now Perennial allergic rhinitis - continue avoidance measures for dust mites, cat and dog.   - start nasal saline spray or gel at least twice daily, use saline gel prior to CPAP use - Use flonase  2 sprays daily - follow this with azelastine  2 sprays up to twice daily as needed - use saline spray or gel prior to flonase  and azelastine  - continue claritin  10 mg daily - continue Singulair  10mg  daily - use Optivar  1 drop each eye up to twice a day as needed for itchy/watery/red eyes.  - continue allergy  injections per schedule and have access to epipen .  - consider sinus CT pending symptoms response to above  Food allergy -stable - continue food avoidance of shellfish, peanut   - will obtain serum IgE levels for these foods.   - have access to your epinephrine  device 0.3mg  in case of allergic reaction.   - follow emergency action plan in case of allergic reaction   Mild intermittent asthma-at goal - have access to albuterol  inhaler 2 puffs every 4-6 hours as needed for cough/wheeze/shortness of breath/chest tightness.  May use 15-20 minutes prior to activity.   Monitor frequency of use.    Follow-up in 3 months or sooner if needed It was  a pleasure meeting you in clinic today! Thank you for allowing me to participate in your care.  Other: none  Rocky Endow, MD  [facility] [place]        "

## 2024-09-30 ENCOUNTER — Ambulatory Visit
Admission: RE | Admit: 2024-09-30 | Discharge: 2024-09-30 | Disposition: A | Discharge status: Home / Self Care | Source: Ambulatory Visit | Attending: Neurosurgery | Admitting: Neurosurgery

## 2024-09-30 DIAGNOSIS — G935 Compression of brain: Secondary | ICD-10-CM

## 2024-10-08 ENCOUNTER — Other Ambulatory Visit: Payer: Self-pay | Admitting: Internal Medicine

## 2024-10-08 DIAGNOSIS — E782 Mixed hyperlipidemia: Secondary | ICD-10-CM

## 2024-10-16 NOTE — Progress Notes (Unsigned)
 "  Subjective:     Patient ID: Anne Barnes, female    DOB: 1946/05/22, 79 y.o.   MRN: [id number]  No chief complaint on file.     Discussed the use of AI scribe software for clinical note transcription with the patient, who gave verbal consent to proceed.  History of Present Illness       Follows with: [facility]- Dr. Lanis  Cardiology    CAD- Follows with cardiology On ASA, Plavix     HFpEF Lasix  20mg  3 times weekly Spironolactone  59m daily Jardiance  10mg  daily   Hypertension Amlodipine  2.5mg , Valsartan  40mg  , Spironolactone  25mg  daily  Hyperlipidemia goal 70mg /dL. Crestor  to 20mg .    Allergy - followed by [facility] Levocetirizine (Xyzal ) 1 tablet 5 mg daily, Singulair  10 mg at bedtime,   GERD Last office visit patient was having increased acid reflux symptoms since returning home from the hospital. +dry cough.  She was started on Protonix  40 mg daily.   Reports symptoms are improved.   Reports compliance with medications       Health Maintenance Due  Topic Date Due   Medicare Annual Wellness (AWV)  06/05/2023   COVID-19 Vaccine (4 - 2025-26 season) 05/25/2024    Past Medical History:  Diagnosis Date   Acute combined systolic and diastolic heart failure (HCC) 05/26/2015   ALLERGIC RHINITIS    Allergic urticaria 08/01/2014   Patient reports allergies to shrimp, egg whites, tomatoes, dairy    Allergy     seasonal and numerous food and drug allergies.   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   Office Spirometry 11/07/17-WNL-FVC 1.58/82%, FEV1 1.27/85%, ratio 0.80, FEF 25-75% 0.26/89%   Bradycardia 02/25/2017   CAD (coronary artery Barnes) 05/01/2023   Myoview  03/26/2017: No ischemia Non-obs dz - CCTA 10/31/2019: CAC score 127; RCA proximal and mid 25-49, LAD proximal and mid 0-25    Chiari malformation    Noted MRI brain 09/2013 - s/p neuro eval for same   DIVERTICULOSIS, COLON    Essential hypertension 08/28/2010   Qualifier: Diagnosis of  By:  Anne Barnes, Anne Barnes      Frequent headaches    GERD    H/O measles    H/O mumps    Hearing loss 01/18/2017   History of chicken pox    Hyperglycemia 03/05/2016   Hyperlipidemia    HYPERTENSION    Hypocalcemia 02/25/2017   IBS (irritable bowel syndrome) 05/26/2015   Nonspecific abnormal electrocardiogram (ECG) (EKG)    OBSTRUCTIVE SLEEP APNEA 12/2008 dx   noncompliant with CPAP qhs   Obstructive sleep apnea 08/28/2010   NPSG 12/2008:  AHI 13/hr with desats to 78%    Preventative health care 06/11/2017   Sciatica    right side   Seasonal and perennial allergic rhinitis 08/28/2010   Allergy  profile 03/11/14- positive especially for dust, cat and dog Food allergy  profile- total IgE 86 with several food group elevations Sed rate-WNL      Sinusitis 06/06/2017   Sleep apnea    uses cpap   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   Vaginitis 01/18/2017   Vitamin D  deficiency 06/11/2017    Past Surgical History:  Procedure Laterality Date   ABDOMINAL HYSTERECTOMY  1986   BIOPSY  07/19/2022   Procedure: BIOPSY;  Surgeon: Anne Sandor GAILS, DO;  Location: [facility];  Service: Gastroenterology;;   BREAST SURGERY  1973   Breast biopsy   CARDIAC CATHETERIZATION     COLONOSCOPY  2011   Dr Anne Barnes ACUTE MI  REVASCULARIZATION N/A 01/30/2024   Procedure: Coronary/Graft Acute MI Revascularization;  Surgeon: Anne Barnes, Anne M, MD;  Location: [facility] INVASIVE CV LAB;  Service: Cardiovascular;  Laterality: N/A;   ESOPHAGOGASTRODUODENOSCOPY (EGD) WITH PROPOFOL  N/A 07/19/2022   Procedure: ESOPHAGOGASTRODUODENOSCOPY (EGD) WITH PROPOFOL ;  Surgeon: Anne Sandor GAILS, DO;  Location: [facility];  Service: Gastroenterology;  Laterality: N/A;   LEFT HEART CATH AND CORONARY ANGIOGRAPHY N/A 01/30/2024   Procedure: LEFT HEART CATH AND CORONARY ANGIOGRAPHY;  Surgeon: Anne Barnes, Anne M, MD;  Location: [facility] INVASIVE CV LAB;  Service: Cardiovascular;  Laterality: N/A;   UMBILICAL HERNIA REPAIR      Family  History  Problem Relation Age of Onset   Arthritis Mother        died of complications from hip surgery   Arthritis Father    Kidney Barnes Father    Pneumonia Father    Heart Barnes Father    Kidney Barnes Sister        dialysis   Obesity Sister    Hypertension Sister    Kidney Barnes Sister        dialysis   Diabetes Brother    Heart Barnes Brother    Heart attack Brother    Alcohol abuse Brother    Throat cancer Brother    Lung cancer Brother    Pneumonia Brother    Lupus Daughter    Rheum arthritis Daughter    Sjogren's syndrome Daughter    Fibromyalgia Daughter    Hypertension Daughter    ALS Daughter    Anne Barnes Daughter    Hypertension Daughter    Anxiety disorder Maternal Uncle    Hypertension Maternal Grandfather    Alcohol abuse Other        parent   Ataxia Neg Hx    [place] Neg Hx    Dementia Neg Hx    Mental retardation Neg Hx    Migraines Neg Hx    Multiple sclerosis Neg Hx    Neurofibromatosis Neg Hx    Neuropathy Neg Hx    Parkinsonism Neg Hx    Seizures Neg Hx    Stroke Neg Hx    Colon cancer Neg Hx    Colon polyps Neg Hx    Esophageal cancer Neg Hx    Rectal cancer Neg Hx    Stomach cancer Neg Hx     Social History   Socioeconomic History   Marital status: Widowed    Spouse name: Not on file   Number of children: 5   Years of education: 68   Highest education level: Some college, no degree  Occupational History   Occupation: retired  Tobacco Use   Smoking status: Never   Smokeless tobacco: Never   Tobacco comments:    dtr 2 g-kids.  Vaping Use   Vaping status: Never Used  Substance and Sexual Activity   Alcohol use: Not Currently    Comment: rare   Drug use: No   Sexual activity: Not Currently  Other Topics Concern   Not on file  Social History Narrative   Right handed   One story with daughter and two grandkids   Social Drivers of Health   Tobacco Use: Low Risk (09/28/2024)   Patient History    Smoking  Tobacco Use: Never    Smokeless Tobacco Use: Never    Passive Exposure: Not on file  Financial Resource Strain: Low Risk (06/04/2022)   Overall Financial Resource Strain (CARDIA)    Difficulty of Paying Living Expenses: Not  hard at all  Food Insecurity: No Food Insecurity (02/05/2024)   Hunger Vital Sign    Worried About Running Out of Food in the Last Year: Never true    Ran Out of Food in the Last Year: Never true  Transportation Needs: No Transportation Needs (02/05/2024)   PRAPARE - Administrator, Civil Service (Medical): No    Lack of Transportation (Non-Medical): No  Physical Activity: Insufficiently Active (06/04/2022)   Exercise Vital Sign    Days of Exercise per Week: 4 days    Minutes of Exercise per Session: 30 min  Stress: No Stress Concern Present (06/04/2022)   [employer] of Occupational Health - Occupational Stress Questionnaire    Feeling of Stress : Not at all  Social Connections: Moderately Integrated (01/30/2024)   Social Connection and Isolation Panel    Frequency of Communication with Friends and Family: Three times a week    Frequency of Social Gatherings with Friends and Family: Once a week    Attends Religious Services: More than 4 times per year    Active Member of [employer] or Organizations: Yes    Attends Banker Meetings: More than 4 times per year    Marital Status: Widowed  Intimate Partner Violence: Not At Risk (02/05/2024)   Humiliation, Afraid, Rape, and Kick questionnaire    Fear of Current or Ex-Partner: No    Emotionally Abused: No    Physically Abused: No    Sexually Abused: No  Depression (PHQ2-9): Low Risk (06/03/2024)   Depression (PHQ2-9)    PHQ-2 Score: 3  Alcohol Screen: Low Risk (06/04/2022)   Alcohol Screen    Last Alcohol Screening Score (AUDIT): 0  Housing: Low Risk (02/05/2024)   Housing Stability Vital Sign    Unable to Pay for Housing in the Last Year: No    Number of Times Moved in the Last Year: 0     Homeless in the Last Year: No  Utilities: Not At Risk (02/05/2024)   AHC Utilities    Threatened with loss of utilities: No  Health Literacy: Not on file    Outpatient Medications Prior to Visit  Medication Sig Dispense Refill   albuterol  (VENTOLIN  HFA) 108 (90 Base) MCG/ACT inhaler INHALE 2 PUFFS BY MOUTH EVERY 4 TO 6 HOURS AS NEEDED FOR COUGH, FOR WHEEZING, SHORTNESS OF BREATH/CHEST 18 g 0   alendronate  (FOSAMAX ) 70 MG tablet Take 1 tablet (70 mg total) by mouth once a week. Take with full glass of water on an empty stomach 12 tablet 3   ALPRAZolam  (XANAX ) 0.5 MG tablet TAKE 1 TABLET BY MOUTH TWICE DAILY AS NEEDED FOR ANXIETY AND FOR SLEEP 20 tablet 0   amLODipine  (NORVASC ) 2.5 MG tablet Take 1 tablet (2.5 mg total) by mouth daily. 30 tablet 3   aspirin  EC 81 MG tablet Take 1 tablet (81 mg total) by mouth daily. Swallow whole. 30 tablet 3   azelastine  (ASTELIN ) 0.1 % nasal spray Place 2 sprays into both nostrils 2 (two) times daily as needed for rhinitis. 30 mL 5   azelastine  (OPTIVAR ) 0.05 % ophthalmic solution Place 1 drop into both eyes 2 (two) times daily as needed. 6 mL 5   b complex vitamins tablet Take 1 tablet by mouth daily.     Cholecalciferol (VITAMIN D ) 50 MCG (2000 UT) tablet Take 2,000 Units by mouth daily.     clopidogrel  (PLAVIX ) 75 MG tablet Take 1 tablet (75 mg total) by mouth daily with breakfast.  90 tablet 3   empagliflozin  (JARDIANCE ) 10 MG TABS tablet Take 1 tablet (10 mg total) by mouth daily before breakfast. 90 tablet 3   EPINEPHrine  0.3 mg/0.3 mL IJ SOAJ injection Inject 0.3 mg into the muscle as needed for anaphylaxis. 2 each 2   Evolocumab  (REPATHA  SURECLICK) 140 MG/ML SOAJ INJECT CONTENTS OF 1 PEN SUBCUTANEOUSLY EVERY 14 DAYS 2 mL 2   fluticasone  (FLONASE ) 50 MCG/ACT nasal spray Use 1-2 sprays in each nostril once  a day as needed for stuffy nose. In the right nostril, point the applicator out toward the right ear. In the left nostril, point the applicator out  toward the left ear 16 g 5   furosemide  (LASIX ) 20 MG tablet Take 1 tablet (20 mg total) by mouth 3 (three) times a week. Monday, Wednesday and Friday. 90 tablet 3   loratadine  (CLARITIN ) 10 MG tablet Take 1 tablet (10 mg total) by mouth daily. 30 tablet 5   MAGNESIUM  CITRATE PO Take 1 tablet by mouth daily.     montelukast  (SINGULAIR ) 10 MG tablet Take 1 tablet (10 mg total) by mouth at bedtime. 30 tablet 5   Multiple Vitamin (MULTIVITAMIN) tablet Take 1 tablet by mouth daily.     nitroGLYCERIN  (NITROSTAT ) 0.4 MG SL tablet DISSOLVE ONE TABLET UNDER THE TONGUE EVERY 5 MINUTES AS NEEDED FOR CHEST PAIN.  DO NOT EXCEED A TOTAL OF 3 DOSES IN 15 MINUTES 25 tablet 0   Pseudoephedrine-Ibuprofen (ADVIL COLD/SINUS PO) Take 2 tablets by mouth as needed.     rosuvastatin  (CRESTOR ) 40 MG tablet Take 1 tablet (40 mg total) by mouth daily. 90 tablet 3   saline (AYR) GEL Place 1 Application into both nostrils at bedtime. 14 g 5   sodium chloride  (OCEAN) 0.65 % SOLN nasal spray Place 1 spray into both nostrils as needed. 88 mL 3   spironolactone  (ALDACTONE ) 25 MG tablet Take 1 tablet (25 mg total) by mouth daily. (Patient not taking: Reported on 09/28/2024) 90 tablet 2   UNABLE TO FIND Take 2 capsules by mouth daily. Fish Oil w/ tumeric     valsartan  (DIOVAN ) 40 MG tablet Take 1 tablet (40 mg total) by mouth daily. 90 tablet 3   No facility-administered medications prior to visit.    Allergies[1]  ROS     Objective:    Physical Exam   There were no vitals taken for this visit. Wt Readings from Last 3 Encounters:  09/28/24 217 lb 9.6 oz (98.7 kg)  08/25/24 220 lb (99.8 kg)  07/15/24 220 lb (99.8 kg)       Assessment & Plan:   Problem List Items Addressed This Visit   None   I am having Anne Barnes maintain her multivitamin, b complex vitamins, furosemide , Vitamin D , amLODipine , spironolactone , EPINEPHrine , Pseudoephedrine-Ibuprofen (ADVIL COLD/SINUS PO), MAGNESIUM  CITRATE PO, UNABLE TO  FIND, aspirin  EC, empagliflozin , clopidogrel , valsartan , alendronate , rosuvastatin , nitroGLYCERIN , ALPRAZolam , sodium chloride , saline, albuterol , azelastine , azelastine , fluticasone , montelukast , loratadine , and Repatha  [employer].  No orders of the defined types were placed in this encounter.     [1]  Allergies Allergen Reactions   Codeine Nausea And Vomiting   Hydralazine  Hcl Other (See Comments)    Pt reports causes dizziness   Lisinopril-Hydrochlorothiazide Hives    REACTION: lip swelling,facial swelling,rash.   Metoprolol  Other (See Comments)    Does not feel well on it    Other Cough    Patient states she is allergic to Shrimp.  Says they make her feel  unwell but do not cause hives, SOB or anaphylaxis.  She just avoids eating them because they can make her have a headache.  Strawberries, watermelon, peanuts, and all shellfish.    environmental   Shrimp [Shellfish Allergy ] Other (See Comments)    Was allergy  tested and showed shrimp was one--has not reacted b/c avoided   "

## 2024-10-16 NOTE — Assessment & Plan Note (Addendum)
 Hx STEMI s/p PCI 01/2024. DAPT x 1 year post ACS. Following with cardiology. On ASA and Plavix 

## 2024-10-20 ENCOUNTER — Ambulatory Visit: Admitting: Student

## 2024-10-20 DIAGNOSIS — E559 Vitamin D deficiency, unspecified: Secondary | ICD-10-CM

## 2024-10-20 DIAGNOSIS — N289 Disorder of kidney and ureter, unspecified: Secondary | ICD-10-CM

## 2024-10-20 DIAGNOSIS — K219 Gastro-esophageal reflux disease without esophagitis: Secondary | ICD-10-CM

## 2024-10-20 DIAGNOSIS — R739 Hyperglycemia, unspecified: Secondary | ICD-10-CM

## 2024-10-20 DIAGNOSIS — I2510 Atherosclerotic heart disease of native coronary artery without angina pectoris: Secondary | ICD-10-CM

## 2024-10-20 DIAGNOSIS — G4733 Obstructive sleep apnea (adult) (pediatric): Secondary | ICD-10-CM

## 2024-10-20 DIAGNOSIS — I10 Essential (primary) hypertension: Secondary | ICD-10-CM

## 2024-10-20 NOTE — Progress Notes (Signed)
 VIAL MADE ON 10/20/24

## 2024-12-28 ENCOUNTER — Ambulatory Visit: Admitting: Internal Medicine
